# Patient Record
Sex: Male | Born: 1935
Health system: Southern US, Community
[De-identification: ages and names within clinical notes are randomized; demographics above are authoritative.]

## PROBLEM LIST (undated history)

## (undated) DIAGNOSIS — K219 Gastro-esophageal reflux disease without esophagitis: Secondary | ICD-10-CM

## (undated) DIAGNOSIS — Z8673 Personal history of transient ischemic attack (TIA), and cerebral infarction without residual deficits: Secondary | ICD-10-CM

## (undated) DIAGNOSIS — M1990 Unspecified osteoarthritis, unspecified site: Secondary | ICD-10-CM

## (undated) DIAGNOSIS — J189 Pneumonia, unspecified organism: Secondary | ICD-10-CM

## (undated) DIAGNOSIS — Z9289 Personal history of other medical treatment: Secondary | ICD-10-CM

## (undated) DIAGNOSIS — N189 Chronic kidney disease, unspecified: Secondary | ICD-10-CM

## (undated) DIAGNOSIS — N2889 Other specified disorders of kidney and ureter: Secondary | ICD-10-CM

## (undated) DIAGNOSIS — H409 Unspecified glaucoma: Secondary | ICD-10-CM

## (undated) DIAGNOSIS — E039 Hypothyroidism, unspecified: Secondary | ICD-10-CM

## (undated) DIAGNOSIS — R4182 Altered mental status, unspecified: Secondary | ICD-10-CM

## (undated) DIAGNOSIS — F329 Major depressive disorder, single episode, unspecified: Secondary | ICD-10-CM

## (undated) DIAGNOSIS — D62 Acute posthemorrhagic anemia: Secondary | ICD-10-CM

## (undated) DIAGNOSIS — G4700 Insomnia, unspecified: Secondary | ICD-10-CM

## (undated) DIAGNOSIS — H269 Unspecified cataract: Secondary | ICD-10-CM

## (undated) DIAGNOSIS — F419 Anxiety disorder, unspecified: Secondary | ICD-10-CM

## (undated) DIAGNOSIS — H3530 Unspecified macular degeneration: Secondary | ICD-10-CM

## (undated) DIAGNOSIS — C801 Malignant (primary) neoplasm, unspecified: Secondary | ICD-10-CM

## (undated) DIAGNOSIS — F32A Depression, unspecified: Secondary | ICD-10-CM

## (undated) DIAGNOSIS — I639 Cerebral infarction, unspecified: Secondary | ICD-10-CM

## (undated) DIAGNOSIS — E785 Hyperlipidemia, unspecified: Secondary | ICD-10-CM

## (undated) DIAGNOSIS — E538 Deficiency of other specified B group vitamins: Secondary | ICD-10-CM

## (undated) DIAGNOSIS — I10 Essential (primary) hypertension: Secondary | ICD-10-CM

## (undated) DIAGNOSIS — E876 Hypokalemia: Secondary | ICD-10-CM

## (undated) DIAGNOSIS — Z87440 Personal history of urinary (tract) infections: Secondary | ICD-10-CM

## (undated) DIAGNOSIS — G629 Polyneuropathy, unspecified: Secondary | ICD-10-CM

## (undated) DIAGNOSIS — Z5189 Encounter for other specified aftercare: Secondary | ICD-10-CM

## (undated) DIAGNOSIS — I493 Ventricular premature depolarization: Secondary | ICD-10-CM

## (undated) DIAGNOSIS — R011 Cardiac murmur, unspecified: Secondary | ICD-10-CM

## (undated) HISTORY — DX: Unspecified macular degeneration: H35.30

## (undated) HISTORY — DX: Unspecified osteoarthritis, unspecified site: M19.90

## (undated) HISTORY — DX: Personal history of other medical treatment: Z92.89

## (undated) HISTORY — DX: Hypothyroidism, unspecified: E03.9

## (undated) HISTORY — DX: Unspecified cataract: H26.9

## (undated) HISTORY — DX: Encounter for other specified aftercare: Z51.89

## (undated) HISTORY — PX: APPENDECTOMY: SHX54

## (undated) HISTORY — DX: Ventricular premature depolarization: I49.3

## (undated) HISTORY — DX: Hypokalemia: E87.6

## (undated) HISTORY — DX: Depression, unspecified: F32.A

## (undated) HISTORY — DX: Unspecified glaucoma: H40.9

## (undated) HISTORY — DX: Altered mental status, unspecified: R41.82

## (undated) HISTORY — DX: Anxiety disorder, unspecified: F41.9

## (undated) HISTORY — PX: PROSTATE ABLATION: SHX6042

## (undated) HISTORY — PX: TONSILLECTOMY AND ADENOIDECTOMY: SUR1326

## (undated) HISTORY — DX: Personal history of urinary (tract) infections: Z87.440

## (undated) HISTORY — PX: OTHER SURGICAL HISTORY: SHX169

## (undated) HISTORY — PX: THYROIDECTOMY: SHX17

## (undated) HISTORY — PX: CATARACT EXTRACTION W/ INTRAOCULAR LENS  IMPLANT, BILATERAL: SHX1307

## (undated) HISTORY — DX: Hyperlipidemia, unspecified: E78.5

## (undated) HISTORY — DX: Personal history of transient ischemic attack (TIA), and cerebral infarction without residual deficits: Z86.73

## (undated) HISTORY — DX: Deficiency of other specified B group vitamins: E53.8

## (undated) HISTORY — PX: REPLACEMENT TOTAL KNEE: SUR1224

## (undated) HISTORY — DX: Insomnia, unspecified: G47.00

## (undated) HISTORY — PX: INGUINAL HERNIA REPAIR: SUR1180

## (undated) HISTORY — DX: Major depressive disorder, single episode, unspecified: F32.9

## (undated) HISTORY — DX: Essential (primary) hypertension: I10

---

## 1997-11-13 ENCOUNTER — Other Ambulatory Visit: Admission: RE | Admit: 1997-11-13 | Discharge: 1997-11-13 | Payer: Self-pay | Admitting: Cardiology

## 1998-01-13 ENCOUNTER — Emergency Department (HOSPITAL_COMMUNITY): Admission: EM | Admit: 1998-01-13 | Discharge: 1998-01-13 | Payer: Self-pay

## 1999-06-26 ENCOUNTER — Emergency Department (HOSPITAL_COMMUNITY): Admission: EM | Admit: 1999-06-26 | Discharge: 1999-06-26 | Payer: Self-pay | Admitting: Emergency Medicine

## 2002-09-20 HISTORY — PX: US ECHOCARDIOGRAPHY: HXRAD669

## 2006-11-10 HISTORY — PX: US ECHOCARDIOGRAPHY: HXRAD669

## 2009-10-21 ENCOUNTER — Encounter: Admission: RE | Admit: 2009-10-21 | Discharge: 2009-10-21 | Payer: Self-pay | Admitting: Cardiology

## 2009-10-25 ENCOUNTER — Encounter: Admission: RE | Admit: 2009-10-25 | Discharge: 2009-10-25 | Payer: Self-pay | Admitting: Cardiology

## 2010-01-03 ENCOUNTER — Observation Stay (HOSPITAL_COMMUNITY): Admission: EM | Admit: 2010-01-03 | Discharge: 2010-01-03 | Payer: Self-pay | Admitting: Emergency Medicine

## 2010-01-13 ENCOUNTER — Inpatient Hospital Stay (HOSPITAL_COMMUNITY): Admission: RE | Admit: 2010-01-13 | Discharge: 2010-01-17 | Payer: Self-pay | Admitting: Orthopedic Surgery

## 2010-02-12 ENCOUNTER — Ambulatory Visit: Payer: Self-pay | Admitting: Cardiology

## 2010-04-25 ENCOUNTER — Ambulatory Visit: Payer: Self-pay | Admitting: Cardiology

## 2010-05-06 ENCOUNTER — Ambulatory Visit: Payer: Self-pay | Admitting: Cardiology

## 2010-05-15 ENCOUNTER — Ambulatory Visit: Payer: Self-pay | Admitting: Cardiology

## 2010-08-12 ENCOUNTER — Other Ambulatory Visit (INDEPENDENT_AMBULATORY_CARE_PROVIDER_SITE_OTHER): Payer: Medicare Other

## 2010-08-12 DIAGNOSIS — I10 Essential (primary) hypertension: Secondary | ICD-10-CM

## 2010-08-12 DIAGNOSIS — Z79899 Other long term (current) drug therapy: Secondary | ICD-10-CM

## 2010-08-12 DIAGNOSIS — E119 Type 2 diabetes mellitus without complications: Secondary | ICD-10-CM

## 2010-08-12 DIAGNOSIS — E032 Hypothyroidism due to medicaments and other exogenous substances: Secondary | ICD-10-CM

## 2010-08-15 ENCOUNTER — Ambulatory Visit (INDEPENDENT_AMBULATORY_CARE_PROVIDER_SITE_OTHER): Payer: Medicare Other | Admitting: Cardiology

## 2010-08-15 DIAGNOSIS — Z79899 Other long term (current) drug therapy: Secondary | ICD-10-CM

## 2010-08-15 DIAGNOSIS — E7800 Pure hypercholesterolemia, unspecified: Secondary | ICD-10-CM

## 2010-08-15 DIAGNOSIS — I119 Hypertensive heart disease without heart failure: Secondary | ICD-10-CM

## 2010-08-15 DIAGNOSIS — E119 Type 2 diabetes mellitus without complications: Secondary | ICD-10-CM

## 2010-08-29 LAB — BASIC METABOLIC PANEL
BUN: 9 mg/dL (ref 6–23)
CO2: 28 mEq/L (ref 19–32)
Calcium: 8.7 mg/dL (ref 8.4–10.5)
Chloride: 102 mEq/L (ref 96–112)
Creatinine, Ser: 0.89 mg/dL (ref 0.4–1.5)
GFR calc Af Amer: >60 mL/min (ref >60)
Potassium: 4.3 mEq/L (ref 3.5–5.1)
Potassium: 4.4 mEq/L (ref 3.5–5.1)
Sodium: 138 mEq/L (ref 135–145)

## 2010-08-29 LAB — GLUCOSE, CAPILLARY
Glucose-Capillary: 119 mg/dL — ABNORMAL HIGH (ref 70–99)
Glucose-Capillary: 128 mg/dL — ABNORMAL HIGH (ref 70–99)
Glucose-Capillary: 137 mg/dL — ABNORMAL HIGH (ref 70–99)
Glucose-Capillary: 140 mg/dL — ABNORMAL HIGH (ref 70–99)
Glucose-Capillary: 145 mg/dL — ABNORMAL HIGH (ref 70–99)
Glucose-Capillary: 151 mg/dL — ABNORMAL HIGH (ref 70–99)
Glucose-Capillary: 152 mg/dL — ABNORMAL HIGH (ref 70–99)
Glucose-Capillary: 156 mg/dL — ABNORMAL HIGH (ref 70–99)
Glucose-Capillary: 191 mg/dL — ABNORMAL HIGH (ref 70–99)
Glucose-Capillary: 213 mg/dL — ABNORMAL HIGH (ref 70–99)

## 2010-08-29 LAB — ABO/RH: ABO/RH(D): O POS

## 2010-08-29 LAB — CBC
HCT: 27.8 % — ABNORMAL LOW (ref 39.0–52.0)
HCT: 33.4 % — ABNORMAL LOW (ref 39.0–52.0)
Hemoglobin: 11.6 g/dL — ABNORMAL LOW (ref 13.0–17.0)
Hemoglobin: 9.9 g/dL — ABNORMAL LOW (ref 13.0–17.0)
MCH: 32.5 pg (ref 26.0–34.0)
MCV: 93.9 fL (ref 78.0–100.0)
MCV: 94.5 fL (ref 78.0–100.0)
Platelets: 208 10*3/uL (ref 150–400)
Platelets: 232 10*3/uL (ref 150–400)
RDW: 13.1 % (ref 11.5–15.5)
WBC: 12.1 10*3/uL — ABNORMAL HIGH (ref 4.0–10.5)

## 2010-08-29 LAB — PROTIME-INR
INR: 1.06 (ref 0.00–1.49)
INR: 1.77 — ABNORMAL HIGH (ref 0.00–1.49)
INR: 2.13 — ABNORMAL HIGH (ref 0.00–1.49)

## 2010-08-29 LAB — TYPE AND SCREEN
ABO/RH(D): O POS
Antibody Screen: NEGATIVE

## 2010-08-30 LAB — POCT I-STAT, CHEM 8
BUN: 26 mg/dL — ABNORMAL HIGH (ref 6–23)
Calcium, Ion: 1 mmol/L — ABNORMAL LOW (ref 1.12–1.32)
Chloride: 103 mEq/L (ref 96–112)
Creatinine, Ser: 1 mg/dL (ref 0.4–1.5)
Glucose, Bld: 242 mg/dL — ABNORMAL HIGH (ref 70–99)
HCT: 51 % (ref 39.0–52.0)
Hemoglobin: 17.3 g/dL — ABNORMAL HIGH (ref 13.0–17.0)
Potassium: 4.1 mEq/L (ref 3.5–5.1)
Sodium: 139 mEq/L (ref 135–145)
TCO2: 27 mmol/L (ref 0–100)

## 2010-08-30 LAB — APTT: aPTT: 28 seconds (ref 24–37)

## 2010-08-30 LAB — URINALYSIS, ROUTINE W REFLEX MICROSCOPIC
Glucose, UA: 250 mg/dL — AB
Hgb urine dipstick: NEGATIVE
Nitrite: NEGATIVE
Specific Gravity, Urine: 1.008 (ref 1.005–1.030)
Specific Gravity, Urine: 1.023 (ref 1.005–1.030)
Urobilinogen, UA: 0.2 mg/dL (ref 0.0–1.0)

## 2010-08-30 LAB — DIFFERENTIAL
Lymphocytes Relative: 2 % — ABNORMAL LOW (ref 12–46)
Lymphs Abs: 0.2 10*3/uL — ABNORMAL LOW (ref 0.7–4.0)
Neutrophils Relative %: 95 % — ABNORMAL HIGH (ref 43–77)

## 2010-08-30 LAB — CBC
MCH: 32.6 pg (ref 26.0–34.0)
MCV: 94.9 fL (ref 78.0–100.0)
Platelets: 190 10*3/uL (ref 150–400)
Platelets: 181 10*3/uL (ref 150–400)
RBC: 4.5 MIL/uL (ref 4.22–5.81)
RBC: 4.98 MIL/uL (ref 4.22–5.81)
WBC: 5.8 10*3/uL (ref 4.0–10.5)
WBC: 13.7 10*3/uL — ABNORMAL HIGH (ref 4.0–10.5)

## 2010-08-30 LAB — COMPREHENSIVE METABOLIC PANEL
ALT: 30 U/L (ref 0–53)
AST: 35 U/L (ref 0–37)
Albumin: 4.2 g/dL (ref 3.5–5.2)
Chloride: 105 mEq/L (ref 96–112)
Creatinine, Ser: 1.03 mg/dL (ref 0.4–1.5)
GFR calc Af Amer: >60 mL/min (ref >60)
Potassium: 4.8 mEq/L (ref 3.5–5.1)
Sodium: 142 mEq/L (ref 135–145)
Total Bilirubin: 0.7 mg/dL (ref 0.3–1.2)

## 2010-08-30 LAB — POCT CARDIAC MARKERS
CKMB, poc: 1.1 ng/mL (ref 1.0–8.0)
Troponin i, poc: <0.05 ng/mL (ref 0.00–0.09)
Troponin i, poc: <0.05 ng/mL (ref 0.00–0.09)

## 2010-08-30 LAB — DIGOXIN LEVEL: Digoxin Level: 0.8 ng/mL (ref 0.8–2.0)

## 2010-09-08 ENCOUNTER — Other Ambulatory Visit: Payer: Self-pay | Admitting: *Deleted

## 2010-09-08 DIAGNOSIS — G4700 Insomnia, unspecified: Secondary | ICD-10-CM

## 2010-09-08 MED ORDER — ZOLPIDEM TARTRATE 5 MG PO TABS: 5.0000 mg | ORAL_TABLET | Freq: Every evening | ORAL | Status: DC | PRN

## 2010-09-08 NOTE — Telephone Encounter (Signed)
Patient phone requesting 90 day rx for [place]

## 2010-12-19 ENCOUNTER — Encounter: Payer: Self-pay | Admitting: Cardiology

## 2010-12-19 ENCOUNTER — Other Ambulatory Visit: Payer: Self-pay | Admitting: *Deleted

## 2010-12-19 DIAGNOSIS — E785 Hyperlipidemia, unspecified: Secondary | ICD-10-CM

## 2010-12-19 DIAGNOSIS — Z79899 Other long term (current) drug therapy: Secondary | ICD-10-CM

## 2010-12-19 DIAGNOSIS — E119 Type 2 diabetes mellitus without complications: Secondary | ICD-10-CM

## 2010-12-23 ENCOUNTER — Other Ambulatory Visit (INDEPENDENT_AMBULATORY_CARE_PROVIDER_SITE_OTHER): Payer: Medicare Other | Admitting: *Deleted

## 2010-12-23 DIAGNOSIS — Z79899 Other long term (current) drug therapy: Secondary | ICD-10-CM

## 2010-12-23 DIAGNOSIS — E119 Type 2 diabetes mellitus without complications: Secondary | ICD-10-CM

## 2010-12-23 DIAGNOSIS — E785 Hyperlipidemia, unspecified: Secondary | ICD-10-CM

## 2010-12-23 LAB — T4, FREE: Free T4: 0.76 ng/dL (ref 0.60–1.60)

## 2010-12-23 LAB — LIPID PANEL
Cholesterol: 167 mg/dL (ref 0–200)
Triglycerides: 44 mg/dL (ref 0.0–149.0)
VLDL: 8.8 mg/dL (ref 0.0–40.0)

## 2010-12-23 LAB — TSH: TSH: 0.03 u[IU]/mL — ABNORMAL LOW (ref 0.35–5.50)

## 2010-12-23 LAB — HEPATIC FUNCTION PANEL
AST: 25 U/L (ref 0–37)
Albumin: 5.1 g/dL (ref 3.5–5.2)

## 2010-12-23 LAB — BASIC METABOLIC PANEL
BUN: 18 mg/dL (ref 6–23)
Creatinine, Ser: 1 mg/dL (ref 0.4–1.5)
GFR: 77.23 mL/min (ref >60.00)
Glucose, Bld: 126 mg/dL — ABNORMAL HIGH (ref 70–99)
Potassium: 4.6 mEq/L (ref 3.5–5.1)

## 2010-12-24 ENCOUNTER — Encounter: Payer: Self-pay | Admitting: Cardiology

## 2010-12-25 ENCOUNTER — Ambulatory Visit (INDEPENDENT_AMBULATORY_CARE_PROVIDER_SITE_OTHER): Payer: Medicare Other | Admitting: Cardiology

## 2010-12-25 ENCOUNTER — Encounter: Payer: Self-pay | Admitting: Cardiology

## 2010-12-25 DIAGNOSIS — I119 Hypertensive heart disease without heart failure: Secondary | ICD-10-CM | POA: Insufficient documentation

## 2010-12-25 DIAGNOSIS — F419 Anxiety disorder, unspecified: Secondary | ICD-10-CM | POA: Insufficient documentation

## 2010-12-25 DIAGNOSIS — N529 Male erectile dysfunction, unspecified: Secondary | ICD-10-CM

## 2010-12-25 DIAGNOSIS — E119 Type 2 diabetes mellitus without complications: Secondary | ICD-10-CM

## 2010-12-25 DIAGNOSIS — E039 Hypothyroidism, unspecified: Secondary | ICD-10-CM | POA: Insufficient documentation

## 2010-12-25 DIAGNOSIS — H3530 Unspecified macular degeneration: Secondary | ICD-10-CM | POA: Insufficient documentation

## 2010-12-25 DIAGNOSIS — Z79899 Other long term (current) drug therapy: Secondary | ICD-10-CM

## 2010-12-25 DIAGNOSIS — R5383 Other fatigue: Secondary | ICD-10-CM

## 2010-12-25 DIAGNOSIS — M1990 Unspecified osteoarthritis, unspecified site: Secondary | ICD-10-CM

## 2010-12-25 DIAGNOSIS — R5381 Other malaise: Secondary | ICD-10-CM

## 2010-12-25 DIAGNOSIS — F341 Dysthymic disorder: Secondary | ICD-10-CM

## 2010-12-25 DIAGNOSIS — E785 Hyperlipidemia, unspecified: Secondary | ICD-10-CM

## 2010-12-25 DIAGNOSIS — G4700 Insomnia, unspecified: Secondary | ICD-10-CM

## 2010-12-25 MED ORDER — ZOLPIDEM TARTRATE 5 MG PO TABS: 5.0000 mg | ORAL_TABLET | Freq: Every evening | ORAL | Status: DC | PRN

## 2010-12-25 MED ORDER — CLOPIDOGREL BISULFATE 75 MG PO TABS: 75.0000 mg | ORAL_TABLET | Freq: Every day | ORAL | Status: DC

## 2010-12-25 MED ORDER — SILDENAFIL CITRATE 100 MG PO TABS: ORAL_TABLET | ORAL | Status: DC

## 2010-12-25 MED ORDER — LIOTHYRONINE SODIUM 25 MCG PO TABS: 25.0000 ug | ORAL_TABLET | Freq: Every day | ORAL | Status: DC

## 2010-12-25 NOTE — Assessment & Plan Note (Signed)
The patient comes in for a scheduled followup visit.  He has been feeling very fatigued.  He has not been having much energy.  He has a lot of leg cramps at night.  He's not able to exercise because of osteoarthritis of his right knee.  He's not been aware of any hematochezia or melena or occult blood loss to explain his fatigue.  He does have a history of hypothyroidism but his thyroid levels have been normal when we checked them.  He does have a past history of anxiety and depression which may be a large part of his complaints of fatigue

## 2010-12-25 NOTE — Progress Notes (Signed)
[name] Date of Birth:  1928-07-26 [facility] / [facility] [address]   [address] [place], [place]  [address] [phone]           Fax   [phone]  History of Present Illness: This pleasant 77 year old gentleman is seen back for a four-month followup office visit.  Has a complex past medical history.  He's had a history of essential hypertension and diabetes.  He's had a history of fatigue and a history of anxiety and depression.  He does not have any history of known ischemic heart disease.  His last nuclear stress test was 10/23/09 and was a walking LexiScan because of his bad knee and he had a normal ejection fraction of 68% and no ischemia.  EKG shows of false positive resp his last echocardiogram was 11/10/06 and showed mild LVH with normal systolic function and with impaired relaxation, mild aortic sclerosis with trivial aortic insufficiency, and mild mitral regurgitation and mild tricuspid regurgitation.  The patient has a long history of genitourinary problems and is followed closely by his urologist Dr. Patsi Sears who is evaluating his difficulty in voiding.  The patient also has a history of erectile dysfunction and is on Viagra p.r.n.  Current Outpatient Prescriptions  Medication Sig Dispense Refill  . ALPRAZolam (XANAX) 0.25 MG tablet Take 0.25 mg by mouth at bedtime as needed.        Marland Kitchen amLODipine (NORVASC) 5 MG tablet Take 5 mg by mouth daily.        Marland Kitchen aspirin 81 MG EC tablet Take 81 mg by mouth daily.        . Bevacizumab (AVASTIN IV) Inject into the vein as directed.        . clopidogrel (PLAVIX) 75 MG tablet Take 1 tablet (75 mg total) by mouth daily.  90 tablet  3  . digoxin (LANOXIN) 0.25 MG tablet Take 250 mcg by mouth daily.        . famotidine (PEPCID) 40 MG tablet Take 40 mg by mouth daily.        Marland Kitchen latanoprost (XALATAN) 0.005 % ophthalmic solution 1 drop at bedtime.        Marland Kitchen levothyroxine (SYNTHROID, LEVOTHROID) 100 MCG tablet Take 100 mcg by mouth  daily.       Marland Kitchen liothyronine (CYTOMEL) 25 MCG tablet Take 25 mcg by mouth daily.        . metFORMIN (GLUCOPHAGE) 1000 MG tablet Take 1,500 mg by mouth 2 (two) times daily with a meal.       . metoprolol succinate (TOPROL-XL) 25 MG 24 hr tablet Take 25 mg by mouth daily.        . sildenafil (VIAGRA) 100 MG tablet Take 100 mg by mouth as directed.        . zolpidem (AMBIEN) 5 MG tablet Take 1 tablet (5 mg total) by mouth at bedtime as needed for sleep.  90 tablet  1    Allergies  Allergen Reactions  . Lipitor (Atorvastatin Calcium)   . Zetia (Ezetimibe)     Patient Active Problem List  Diagnoses  . Hypothyroidism  . Benign hypertensive heart disease without heart failure  . Fatigue  . Dyslipidemia  . Diabetes mellitus  . Anxiety and depression  . Macular degeneration of both eyes  . Osteoarthritis    History  Smoking status  . Never Smoker   Smokeless tobacco  . Not on file    History  Alcohol Use No    Family  History  Problem Relation Age of Onset  . Hypertension Mother   . Arthritis Mother   . Diabetes Mother   . Hypertension Father   . Stroke Father   . Kidney disease Father   . Hypertension Brother   . Diabetes Brother     Review of Systems: Constitutional: no fever chills diaphoresis or fatigue or change in weight.  Head and neck: no hearing loss, no epistaxis, no photophobia or visual disturbance. Respiratory: No cough, shortness of breath or wheezing. Cardiovascular: No chest pain peripheral edema, palpitations. Gastrointestinal: No abdominal distention, no abdominal pain, no change in bowel habits hematochezia or melena. Genitourinary: No dysuria, no frequency, no urgency, no nocturia. Musculoskeletal:No arthralgias, no back pain, no gait disturbance or myalgias. Neurological: No dizziness, no headaches, no numbness, no seizures, no syncope, no weakness, no tremors. Hematologic: No lymphadenopathy, no easy bruising. Psychiatric: No confusion, no  hallucinations, no sleep disturbance.    Physical Exam: Filed Vitals:   12/25/10 0830  BP: 130/74  Pulse: 60  The general appearance reveals a well-developed well-nourished gentleman who is very anxious.Pupils equal and reactive.   Extraocular Movements are full.  There is no scleral icterus.  The mouth and pharynx are normal.  The neck is supple.  The carotids reveal no bruits.  The jugular venous pressure is normal.  The thyroid is not enlarged.  There is no lymphadenopathy.  His vision is decreased secondary to macular degeneration.The chest is clear to percussion and auscultation. There are no rales or rhonchi. Expansion of the chest is symmetrical.The precordium is quiet.  The first heart sound is normal.  The second heart sound is physiologically split.  There is no murmur gallop rub or click.  There is no abnormal lift or heaveThe abdomen is soft and nontender. Bowel sounds are normal. The liver and spleen are not enlarged. There Are no abdominal masses. There are no bruits.The pedal pulses are good.  There is no phlebitis or edema.  There is no cyanosis or clubbing.Strength is normal and symmetrical in all extremities.  There is no lateralizing weakness.  There are no sensory deficits.The skin is warm and dry.  There is no rash.   Assessment / Plan: The patient is to continue his same medication.  Blood work is pending.  Recheck in 4 months for followup office visit and fasting lab work.

## 2010-12-25 NOTE — Assessment & Plan Note (Signed)
Patient has a history of diabetes mellitus.  He has not been having any hypoglycemic episodes.  He watches his diet and his weight is down 2 pounds since last visit.

## 2011-01-26 ENCOUNTER — Encounter: Payer: Self-pay | Admitting: Cardiology

## 2011-04-23 ENCOUNTER — Other Ambulatory Visit (INDEPENDENT_AMBULATORY_CARE_PROVIDER_SITE_OTHER): Payer: Medicare Other | Admitting: *Deleted

## 2011-04-23 DIAGNOSIS — E785 Hyperlipidemia, unspecified: Secondary | ICD-10-CM

## 2011-04-23 DIAGNOSIS — I119 Hypertensive heart disease without heart failure: Secondary | ICD-10-CM

## 2011-04-23 DIAGNOSIS — Z79899 Other long term (current) drug therapy: Secondary | ICD-10-CM

## 2011-04-23 LAB — HEPATIC FUNCTION PANEL
AST: 21 U/L (ref 0–37)
Alkaline Phosphatase: 72 U/L (ref 39–117)
Bilirubin, Direct: 0.1 mg/dL (ref 0.0–0.3)
Total Bilirubin: 1 mg/dL (ref 0.3–1.2)

## 2011-04-23 LAB — LIPID PANEL
Total CHOL/HDL Ratio: 3
Triglycerides: 40 mg/dL (ref 0.0–149.0)

## 2011-04-23 LAB — BASIC METABOLIC PANEL
BUN: 20 mg/dL (ref 6–23)
CO2: 30 mEq/L (ref 19–32)
Calcium: 9.1 mg/dL (ref 8.4–10.5)
Creatinine, Ser: 1 mg/dL (ref 0.4–1.5)
Glucose, Bld: 128 mg/dL — ABNORMAL HIGH (ref 70–99)

## 2011-04-27 ENCOUNTER — Encounter: Payer: Self-pay | Admitting: Cardiology

## 2011-04-27 ENCOUNTER — Ambulatory Visit (INDEPENDENT_AMBULATORY_CARE_PROVIDER_SITE_OTHER): Payer: Medicare Other | Admitting: Cardiology

## 2011-04-27 VITALS — BP 136/72 | HR 58 | Ht 73.0 in | Wt 160.0 lb

## 2011-04-27 DIAGNOSIS — I119 Hypertensive heart disease without heart failure: Secondary | ICD-10-CM

## 2011-04-27 DIAGNOSIS — G4700 Insomnia, unspecified: Secondary | ICD-10-CM

## 2011-04-27 DIAGNOSIS — N401 Enlarged prostate with lower urinary tract symptoms: Secondary | ICD-10-CM

## 2011-04-27 DIAGNOSIS — F419 Anxiety disorder, unspecified: Secondary | ICD-10-CM

## 2011-04-27 DIAGNOSIS — E039 Hypothyroidism, unspecified: Secondary | ICD-10-CM

## 2011-04-27 DIAGNOSIS — E119 Type 2 diabetes mellitus without complications: Secondary | ICD-10-CM

## 2011-04-27 DIAGNOSIS — M1990 Unspecified osteoarthritis, unspecified site: Secondary | ICD-10-CM

## 2011-04-27 DIAGNOSIS — H3530 Unspecified macular degeneration: Secondary | ICD-10-CM

## 2011-04-27 DIAGNOSIS — N139 Obstructive and reflux uropathy, unspecified: Secondary | ICD-10-CM

## 2011-04-27 DIAGNOSIS — N138 Other obstructive and reflux uropathy: Secondary | ICD-10-CM

## 2011-04-27 MED ORDER — AMLODIPINE BESYLATE 5 MG PO TABS: 5.0000 mg | ORAL_TABLET | Freq: Every day | ORAL | Status: DC

## 2011-04-27 MED ORDER — ZOLPIDEM TARTRATE 5 MG PO TABS: 5.0000 mg | ORAL_TABLET | Freq: Every evening | ORAL | Status: DC | PRN

## 2011-04-27 MED ORDER — METOPROLOL SUCCINATE ER 25 MG PO TB24: 25.0000 mg | ORAL_TABLET | Freq: Every day | ORAL | Status: DC

## 2011-04-27 MED ORDER — ALPRAZOLAM 0.25 MG PO TABS: 0.2500 mg | ORAL_TABLET | Freq: Every day | ORAL | Status: DC | PRN

## 2011-04-27 NOTE — Assessment & Plan Note (Signed)
His vision is deteriorating further in his good eye which is his left eye

## 2011-04-27 NOTE — Patient Instructions (Signed)
Your physician recommends that you continue on your current medications as directed. Please refer to the Current Medication list given to you today.  Your physician recommends that you schedule a follow-up appointment in: 4 months with fasting labs (lp/bmet/hfp/tsh/t4/a1c)

## 2011-04-27 NOTE — Assessment & Plan Note (Signed)
The patient is clinically euthyroid. 

## 2011-04-27 NOTE — Assessment & Plan Note (Signed)
The patient is not having any hypoglycemic episodes 

## 2011-04-27 NOTE — Assessment & Plan Note (Signed)
The patient remains extremely anxious.  He is worried about his failing vision and also about the fact that his voiding has not improved since surgery

## 2011-04-27 NOTE — Progress Notes (Signed)
Stephen Wilkinson Date of Birth:  [birth year] [facility] / [facility] [address]   [address] [name], [place]  [address] [phone]           Fax   [phone]  History of Present Illness: This pleasant 77 year old gentleman is seen for a scheduled followup office visit he has a complex past medical history.  He had a history of essential hypertension and diabetes.  He said history of fatigue and a history of anxiety and depression.  Does not have any history of known ischemic heart disease.  His last nuclear stress test was 10/23/09 and he had a normal ejection fraction of 60% and no ischemia.  His last echocardiogram was 11/10/06 and showed mild LVH with normal systolic function with impaired relaxation.  Patient has a long history of genitourinary problems and is followed by his urologist Dr. Cassell Smiles who performed  surgery on his prostate about 6 weeks ago.  Patient also has a problem with severe osteoarthritis of the right knee and is scheduled for right knee replacement by Dr. Lequita Halt in 2 weeks.  Current Outpatient Prescriptions  Medication Sig Dispense Refill  . ALPRAZolam (XANAX) 0.25 MG tablet Take 0.25 mg by mouth at bedtime as needed.        Marland Kitchen amLODipine (NORVASC) 5 MG tablet Take 5 mg by mouth daily.        Marland Kitchen aspirin 81 MG EC tablet Take 81 mg by mouth daily.        . Bevacizumab (AVASTIN IV) Inject into the vein as directed.        . clopidogrel (PLAVIX) 75 MG tablet Take 1 tablet (75 mg total) by mouth daily.  90 tablet  3  . digoxin (LANOXIN) 0.25 MG tablet Take 250 mcg by mouth as directed. 1 tablet 5 days a week and 1 & 1/2 2 days a week      . famotidine (PEPCID) 40 MG tablet Take 40 mg by mouth daily.        Marland Kitchen latanoprost (XALATAN) 0.005 % ophthalmic solution 1 drop at bedtime.        Marland Kitchen levothyroxine (SYNTHROID, LEVOTHROID) 100 MCG tablet Take 100 mcg by mouth as directed. 1 tablet 4 days a week and 1 & 1/2 tablet 3 days a week      . liothyronine (CYTOMEL) 25 MCG  tablet Take 25 mcg by mouth as directed. 1 tablet 6 days a week      . metFORMIN (GLUCOPHAGE) 1000 MG tablet Take 1,500 mg by mouth 2 (two) times daily with a meal.       . metoprolol succinate (TOPROL-XL) 25 MG 24 hr tablet Take 25 mg by mouth daily.        . sildenafil (VIAGRA) 100 MG tablet Take 100 mg by mouth as directed.        . zolpidem (AMBIEN) 5 MG tablet Take 1 tablet (5 mg total) by mouth at bedtime as needed for sleep.  90 tablet  1    Allergies  Allergen Reactions  . Lipitor (Atorvastatin Calcium)   . Zetia (Ezetimibe)     Patient Active Problem List  Diagnoses  . Hypothyroidism  . Benign hypertensive heart disease without heart failure  . Fatigue  . Dyslipidemia  . Diabetes mellitus  . Anxiety and depression  . Macular degeneration of both eyes  . Osteoarthritis    History  Smoking status  . Never Smoker   Smokeless tobacco  . Not on file  History  Alcohol Use No    Family History  Problem Relation Age of Onset  . Hypertension Mother   . Arthritis Mother   . Diabetes Mother   . Hypertension Father   . Stroke Father   . Kidney disease Father   . Hypertension Brother   . Diabetes Brother     Review of Systems: Constitutional: no fever chills diaphoresis or fatigue or change in weight.  Head and neck: no hearing loss, no epistaxis, no photophobia or visual disturbance. Respiratory: No cough, shortness of breath or wheezing. Cardiovascular: No chest pain peripheral edema, palpitations. Gastrointestinal: No abdominal distention, no abdominal pain, no change in bowel habits hematochezia or melena. Genitourinary: No dysuria, no frequency, no urgency, no nocturia. Musculoskeletal:No arthralgias, no back pain, no gait disturbance or myalgias. Neurological: No dizziness, no headaches, no numbness, no seizures, no syncope, no weakness, no tremors. Hematologic: No lymphadenopathy, no easy bruising. Psychiatric: No confusion, no hallucinations, no sleep  disturbance.    Physical Exam: Filed Vitals:   04/27/11 0918  BP: 136/72  Pulse: 58   The patient appears to be in no distress.  Head and neck exam reveals that the pupils are equal and reactive.  The extraocular movements are full.  There is no scleral icterus.  Mouth and pharynx are benign.  No lymphadenopathy.  No carotid bruits.  The jugular venous pressure is normal.  Thyroid is not enlarged or tender.  Chest is clear to percussion and auscultation.  No rales or rhonchi.  Expansion of the chest is symmetrical.  Heart reveals no abnormal lift or heave.  First and second heart sounds are normal.  There is no murmur gallop rub or click.  The abdomen is soft and nontender.  Bowel sounds are normoactive.  There is no hepatosplenomegaly or mass.  There are no abdominal bruits.  Extremities reveal no phlebitis or edema.  Pedal pulses are good.  There is no cyanosis or clubbing.  Neurologic exam is normal strength and no lateralizing weakness.  No sensory deficits.  Integument reveals no rash His EKG today shows sinus bradycardia and nonspecific ST-T wave changes but no acute changes and we gave him a copy of her to carry to his presurgical conference.  Assessment / Plan:  From a cardiac standpoint the patient is stable for upcoming orthopedic surgery.  Continue same medications and recheck here in 4 months

## 2011-05-01 ENCOUNTER — Encounter: Payer: Self-pay | Admitting: Cardiology

## 2011-05-01 ENCOUNTER — Other Ambulatory Visit: Payer: Self-pay | Admitting: Orthopedic Surgery

## 2011-05-01 ENCOUNTER — Encounter (HOSPITAL_COMMUNITY): Payer: Self-pay

## 2011-05-01 ENCOUNTER — Ambulatory Visit (HOSPITAL_COMMUNITY)
Admission: RE | Admit: 2011-05-01 | Discharge: 2011-05-01 | Disposition: A | Discharge status: Home / Self Care | Payer: Medicare Other | Source: Ambulatory Visit | Attending: Orthopedic Surgery | Admitting: Orthopedic Surgery

## 2011-05-01 ENCOUNTER — Encounter (HOSPITAL_COMMUNITY)
Admission: RE | Admit: 2011-05-01 | Discharge: 2011-05-01 | Payer: Medicare Other | Source: Ambulatory Visit | Attending: Orthopedic Surgery | Admitting: Orthopedic Surgery

## 2011-05-01 DIAGNOSIS — Z01812 Encounter for preprocedural laboratory examination: Secondary | ICD-10-CM | POA: Insufficient documentation

## 2011-05-01 DIAGNOSIS — M1710 Unilateral primary osteoarthritis, unspecified knee: Secondary | ICD-10-CM | POA: Insufficient documentation

## 2011-05-01 DIAGNOSIS — Z79899 Other long term (current) drug therapy: Secondary | ICD-10-CM | POA: Insufficient documentation

## 2011-05-01 DIAGNOSIS — Z7902 Long term (current) use of antithrombotics/antiplatelets: Secondary | ICD-10-CM | POA: Insufficient documentation

## 2011-05-01 DIAGNOSIS — E119 Type 2 diabetes mellitus without complications: Secondary | ICD-10-CM | POA: Insufficient documentation

## 2011-05-01 DIAGNOSIS — E7800 Pure hypercholesterolemia, unspecified: Secondary | ICD-10-CM | POA: Insufficient documentation

## 2011-05-01 HISTORY — DX: Cerebral infarction, unspecified: I63.9

## 2011-05-01 HISTORY — DX: Chronic kidney disease, unspecified: N18.9

## 2011-05-01 HISTORY — DX: Malignant (primary) neoplasm, unspecified: C80.1

## 2011-05-01 HISTORY — DX: Pneumonia, unspecified organism: J18.9

## 2011-05-01 LAB — COMPREHENSIVE METABOLIC PANEL
AST: 19 U/L (ref 0–37)
Albumin: 4.2 g/dL (ref 3.5–5.2)
BUN: 16 mg/dL (ref 6–23)
Calcium: 9.8 mg/dL (ref 8.4–10.5)
Creatinine, Ser: 0.94 mg/dL (ref 0.50–1.35)
GFR calc non Af Amer: 77 mL/min — ABNORMAL LOW (ref >90)

## 2011-05-01 LAB — URINALYSIS, ROUTINE W REFLEX MICROSCOPIC
Bilirubin Urine: NEGATIVE
Ketones, ur: NEGATIVE mg/dL
Leukocytes, UA: NEGATIVE
Nitrite: NEGATIVE
Specific Gravity, Urine: 1.022 (ref 1.005–1.030)
Urobilinogen, UA: 0.2 mg/dL (ref 0.0–1.0)
pH: 5.5 (ref 5.0–8.0)

## 2011-05-01 LAB — PROTIME-INR
INR: 0.99 (ref 0.00–1.49)
Prothrombin Time: 13.3 seconds (ref 11.6–15.2)

## 2011-05-01 LAB — CBC
HCT: 41.1 % (ref 39.0–52.0)
MCH: 31.6 pg (ref 26.0–34.0)
MCV: 91.5 fL (ref 78.0–100.0)
Platelets: 230 10*3/uL (ref 150–400)
RDW: 12.8 % (ref 11.5–15.5)

## 2011-05-01 NOTE — Pre-Procedure Instructions (Signed)
04/27/11 Pt seen by Dr Patty Sermons.  Office visit in [facility].

## 2011-05-01 NOTE — H&P (Signed)
Stephen Wilkinson  DOB: August 27, 1928  Date of Admission: 05/11/2011  Chief Complaint:  Right Knee Pain  History of Present Illness The patient is a 77 year old male who comes in today for a preoperative History and Physical. The patient is scheduled for a right total knee arthroplasty to be performed by Dr. Gus Rankin. Aluisio, MD at [facility] on 05/11/2011.They have been seen for right knee pain and known arthritis. He has done well with his first knee replacement and now presents for the other side.  Allergies Lipitor Zetia  Medication History ALPRAZolam (0.25MG  Tablet, Oral) Active. Aspirin EC (81MG  Tablet DR, Oral daily) Active. AmLODIPine Besylate (5MG  Tablet, Oral) Active. Avastin (400MG /16ML Solution, Intravenous once every 5 weeks) Active. Vit Balanced B-100 ( Oral daily) Active. Cytomel ( Tablet, Oral daily except Sunday) Active. Digoxin (0.25MG  Tablet, Oral) Active. Famotidine (40MG  Tablet, Oral) Active. Mega Vite 75 ( Oral daily) Active. MetFORMIN HCl (1000MG  Tablet, Oral) Active. Metoprolol Succinate (25MG  Tablet ER 24HR, Oral) Active. Plavix (75MG  Tablet, Oral daily) Active. Synthroid ( Tablet, Oral) Active. Xalatan (0.005% Solution, Ophthalmic at bedtime) Active. Zolpidem Tartrate (5MG  Tablet, Oral) Active.  Problem List/Past Medical Stroke Impaired Vision Anxiety Disorder History of Depression Macular Degeneration. Left Eye Legal Blindness, Partial Loss with Right Eye Glaucoma Cataract Chronic Bronchitis Hypertension Coronary Artery Disease/Heart Disease Hypercholesterolemia/Dyslipidemia Arrhythmia Enlarged prostate Urinary Tract Infection. Past History Hypothyroidism Non-Insulin Dependent Diabetes Mellitus Skin Cancer Measles Mumps  Past Surgical History Tonsillectomy Thyroidectomy; Subtotal. Partial Total Knee Replacement - Left Open Inguinal Hernia Surgery - Both Microwave Prostate Procedure  Family  History Father. Deceased, Cerebrovascular Accident, Hypertension. Age 25 Mother. Deceased. Diabetes, Hypertension, Arthritis, Age 37  Social History Merchant [employer] officer. Living Will, Healthcare POA Post-Surgical Plans. Plan to go home. Marital status. Married. Current work status. Retired. Tobacco use. Never smoker. Alcohol use. Occasional alcohol use. 1-2 drinks per day  Review of Systems General:Not Present- Chills, Fever, Night Sweats, Fatigue, Weight Gain, Weight Loss and Memory Loss. Skin:Not Present- Hives, Itching, Rash, Eczema and Lesions. HEENT:Not Present- Tinnitus, Headache, Double Vision, Visual Loss, Hearing Loss and Dentures. Respiratory:Not Present- Shortness of breath with exertion, Shortness of breath at rest, Allergies, Coughing up blood and Chronic Cough. Cardiovascular:Not Present- Chest Pain, Racing/skipping heartbeats, Difficulty Breathing Lying Down, Murmur, Swelling and Palpitations. Gastrointestinal:Not Present- Bloody Stool, Heartburn, Abdominal Pain, Vomiting, Nausea, Constipation, Diarrhea, Difficulty Swallowing, Jaundice and Loss of appetitie. Male Genitourinary:Present- Urinary frequency and Excessive Urination at Night. Not Present- Blood in Urine, Weak urinary stream, Discharge, Flank Pain, Incontinence, Painful Urination, Urgency, Urinary Retention and Urinating at Night. Musculoskeletal:Not Present- Muscle Weakness, Muscle Pain, Joint Swelling, Joint Pain, Back Pain, Morning Stiffness and Spasms. Neurological:Not Present- Tremor, Dizziness, Blackout spells, Paralysis, Difficulty with balance and Weakness. Psychiatric:Not Present- Insomnia.  Vitals Weight: 160 lb Height: 72 in Weight was reported by patient. Height was reported by patient. Body Surface Area: 1.92 m Body Mass Index: 21.7 kg/m Pulse: 60 (Regular) Resp.: 14 (Unlabored) BP: 152/72 (Sitting, Left Arm, Standard)  Physical Exam The physical exam findings are  as follows:  General Mental Status - Alert, cooperative and good historian. General Appearance- pleasant. Not in acute distress. Orientation- Oriented X3. Build & Nutrition- Well nourished and Well developed. Note: Good Historian, very pleasant.  Head and Neck Head- normocephalic, atraumatic . Neck Global Assessment- supple. no bruit auscultated on the right and no bruit auscultated on the left.  Eye Pupil- Bilateral- Regular and Round. Note: Legally blind in left eye but does have peripheral vision. Right eye  decreased vision but adequate and can read with right eye. Motion- Bilateral- EOMI.  Chest and Lung Exam Auscultation: Breath sounds:- clear at anterior chest wall and - clear at posterior chest wall. Adventitious sounds:- No Adventitious sounds.  Cardiovascular Auscultation:Rhythm- Regular rate and rhythm. Heart Sounds- S1 WNL and S2 WNL. Murmurs & Other Heart Sounds:Auscultation of the heart reveals - No Murmurs.  Abdomen Palpation/Percussion:Tenderness- Abdomen is non-tender to palpation. Rigidity (guarding)- Abdomen is soft. Auscultation:Auscultation of the abdomen reveals - Bowel sounds normal.  Male Genitourinary Note: Not done, not pertinent to present illness  Musculoskeletal Note: Knee shows no effusion. He has the varus deformity. He has crepitation throughout the ROM, tenderness to the joint line to palpation.  Assessment & Plan Osteoarthrosis Right Knee  Note: Plan is for a right total knee arthroplasty per Dr. Lequita Halt. Risks and benefits of the surgery have been discussed with the patient and they elect to proceed with surgery.  There are on active contraindications to upcoming procedure such as ongoing infection or progressive neurological disease. Please note that the patient's Urologist wanted him to be started on Cipro 500 mg twice a day right after his surgery during his hospital course.  He has been seen by his  cardiologist Dr. Patty Sermons preoperatively and felt to be stable for upcoming surgery.  Avel Peace, PA-C

## 2011-05-01 NOTE — Patient Instructions (Signed)
22 Stephen Wilkinson  05/01/2011   Your procedure is scheduled on:  05/11/11 0715 am-0820 am  Report to [facility] at 0515 AM.  Call this number if you have problems the morning of surgery: [phone]   Remember:Eat a good healthy snack prior to bedtime    Do not eat food:After Midnight.  Do not drink clear liquids: After Midnight.  Take these medicines the morning of surgery with A SIP OF WATER:    Do not wear jewelry,  Do not wear lotions, powders, or perfumes.     Do not bring valuables to the hospital.  Contacts, dentures or bridgework may not be worn into surgery.  Leave suitcase in the car. After surgery it may be brought to your room.  For patients admitted to the hospital, checkout time is 11:00 AM the day of discharge.      Special Instructions: CHG Shower Use Special Wash: 1/2 bottle night before surgery and 1/2 bottle morning of surgery. Shower chin to toes with CHG. Wash face and private parts with regular soap.   Please read over the following fact sheets that you were given: MRSA Information, Incentive Spirometry, coughing and deep breathing exercises, leg exercises, Blood Transfusion Fact Sheet

## 2011-05-08 MED ORDER — SODIUM CHLORIDE 0.9 % IJ SOLN: 10.0000 mL | INTRAMUSCULAR | Status: DC | PRN

## 2011-05-08 MED ORDER — SODIUM CHLORIDE 0.9 % IJ SOLN: 10.0000 mL | Freq: Two times a day (BID) | INTRAMUSCULAR | Status: DC

## 2011-05-10 MED ORDER — BUPIVACAINE 0.25 % ON-Q PUMP SINGLE CATH 300ML
300.0000 mL | INJECTION | Status: DC
  Administered 2011-05-11: 300 mL
  Filled 2011-05-10: qty 300

## 2011-05-11 ENCOUNTER — Inpatient Hospital Stay (HOSPITAL_COMMUNITY)
Admission: RE | Admit: 2011-05-11 | Discharge: 2011-05-15 | DRG: 470 | Disposition: A | Discharge status: Skilled Nursing Facility | Payer: Medicare Other | Source: Ambulatory Visit | Attending: Orthopedic Surgery | Admitting: Orthopedic Surgery

## 2011-05-11 ENCOUNTER — Encounter (HOSPITAL_COMMUNITY)
Admission: RE | Disposition: A | Discharge status: Skilled Nursing Facility | Payer: Self-pay | Source: Ambulatory Visit | Attending: Orthopedic Surgery

## 2011-05-11 ENCOUNTER — Encounter (HOSPITAL_COMMUNITY): Payer: Self-pay | Admitting: Anesthesiology

## 2011-05-11 ENCOUNTER — Encounter (HOSPITAL_COMMUNITY): Payer: Self-pay | Admitting: *Deleted

## 2011-05-11 ENCOUNTER — Inpatient Hospital Stay (HOSPITAL_COMMUNITY): Payer: Medicare Other | Admitting: Anesthesiology

## 2011-05-11 ENCOUNTER — Other Ambulatory Visit: Payer: Self-pay

## 2011-05-11 DIAGNOSIS — D62 Acute posthemorrhagic anemia: Secondary | ICD-10-CM | POA: Diagnosis not present

## 2011-05-11 DIAGNOSIS — E119 Type 2 diabetes mellitus without complications: Secondary | ICD-10-CM | POA: Diagnosis present

## 2011-05-11 DIAGNOSIS — M1710 Unilateral primary osteoarthritis, unspecified knee: Principal | ICD-10-CM | POA: Diagnosis present

## 2011-05-11 DIAGNOSIS — E876 Hypokalemia: Secondary | ICD-10-CM | POA: Diagnosis not present

## 2011-05-11 DIAGNOSIS — I951 Orthostatic hypotension: Secondary | ICD-10-CM | POA: Diagnosis not present

## 2011-05-11 DIAGNOSIS — R5381 Other malaise: Secondary | ICD-10-CM | POA: Diagnosis not present

## 2011-05-11 DIAGNOSIS — R4182 Altered mental status, unspecified: Secondary | ICD-10-CM | POA: Diagnosis not present

## 2011-05-11 DIAGNOSIS — M1711 Unilateral primary osteoarthritis, right knee: Secondary | ICD-10-CM

## 2011-05-11 DIAGNOSIS — Z96659 Presence of unspecified artificial knee joint: Secondary | ICD-10-CM

## 2011-05-11 HISTORY — PX: TOTAL KNEE ARTHROPLASTY: SHX125

## 2011-05-11 HISTORY — DX: Acute posthemorrhagic anemia: D62

## 2011-05-11 LAB — GLUCOSE, CAPILLARY
Glucose-Capillary: 132 mg/dL — ABNORMAL HIGH (ref 70–99)
Glucose-Capillary: 96 mg/dL (ref 70–99)
Glucose-Capillary: 214 mg/dL — ABNORMAL HIGH (ref 70–99)
Glucose-Capillary: 234 mg/dL — ABNORMAL HIGH (ref 70–99)

## 2011-05-11 SURGERY — ARTHROPLASTY, KNEE, TOTAL
Anesthesia: Spinal | Site: Knee | Laterality: Right | Wound class: Clean

## 2011-05-11 MED ORDER — WARFARIN VIDEO
Freq: Once | Status: AC
  Administered 2011-05-11: 20:00:00
  Filled 2011-05-11: qty 1

## 2011-05-11 MED ORDER — DIPHENHYDRAMINE HCL 50 MG/ML IJ SOLN: 12.5000 mg | Freq: Four times a day (QID) | INTRAMUSCULAR | Status: DC | PRN

## 2011-05-11 MED ORDER — BISACODYL 10 MG RE SUPP: 10.0000 mg | Freq: Every day | RECTAL | Status: DC | PRN

## 2011-05-11 MED ORDER — LACTATED RINGERS IV SOLN
INTRAVENOUS | Status: DC | PRN
  Administered 2011-05-11: 07:00:00 via INTRAVENOUS

## 2011-05-11 MED ORDER — METFORMIN HCL 500 MG PO TABS
1000.0000 mg | ORAL_TABLET | Freq: Every day | ORAL | Status: DC
  Administered 2011-05-14 – 2011-05-15 (×2): 1000 mg via ORAL
  Filled 2011-05-11 (×2): qty 2

## 2011-05-11 MED ORDER — AMLODIPINE BESYLATE 5 MG PO TABS
5.0000 mg | ORAL_TABLET | Freq: Every day | ORAL | Status: DC
  Administered 2011-05-12 – 2011-05-15 (×3): 5 mg via ORAL
  Filled 2011-05-11 (×4): qty 1

## 2011-05-11 MED ORDER — FENTANYL CITRATE 0.05 MG/ML IJ SOLN: 25.0000 ug | INTRAMUSCULAR | Status: DC | PRN

## 2011-05-11 MED ORDER — FENTANYL CITRATE 0.05 MG/ML IJ SOLN
INTRAMUSCULAR | Status: DC | PRN
  Administered 2011-05-11 (×2): 50 ug via INTRAVENOUS

## 2011-05-11 MED ORDER — ONDANSETRON HCL 4 MG PO TABS: 4.0000 mg | ORAL_TABLET | Freq: Four times a day (QID) | ORAL | Status: DC | PRN

## 2011-05-11 MED ORDER — CIPROFLOXACIN IN D5W 400 MG/200ML IV SOLN: 400.0000 mg | Freq: Once | INTRAVENOUS | Status: DC

## 2011-05-11 MED ORDER — WARFARIN SODIUM 5 MG PO TABS
5.0000 mg | ORAL_TABLET | Freq: Once | ORAL | Status: AC
  Administered 2011-05-11: 5 mg via ORAL
  Filled 2011-05-11: qty 1

## 2011-05-11 MED ORDER — CEFAZOLIN SODIUM 1-5 GM-% IV SOLN
1.0000 g | Freq: Once | INTRAVENOUS | Status: AC
  Administered 2011-05-11: 1 g via INTRAVENOUS

## 2011-05-11 MED ORDER — METHOCARBAMOL 500 MG PO TABS
500.0000 mg | ORAL_TABLET | Freq: Four times a day (QID) | ORAL | Status: DC | PRN
  Administered 2011-05-12 – 2011-05-15 (×5): 500 mg via ORAL
  Filled 2011-05-11 (×5): qty 1

## 2011-05-11 MED ORDER — CEFAZOLIN SODIUM 1-5 GM-% IV SOLN
1.0000 g | Freq: Four times a day (QID) | INTRAVENOUS | Status: AC
  Administered 2011-05-11 – 2011-05-12 (×3): 1 g via INTRAVENOUS
  Filled 2011-05-11 (×3): qty 50

## 2011-05-11 MED ORDER — PROPOFOL 10 MG/ML IV EMUL
INTRAVENOUS | Status: DC | PRN
  Administered 2011-05-11: 50 ug/kg/min via INTRAVENOUS

## 2011-05-11 MED ORDER — ONDANSETRON HCL 4 MG/2ML IJ SOLN: 4.0000 mg | Freq: Four times a day (QID) | INTRAMUSCULAR | Status: DC | PRN

## 2011-05-11 MED ORDER — LEVOTHYROXINE SODIUM 100 MCG PO TABS: 100.0000 ug | ORAL_TABLET | ORAL | Status: DC

## 2011-05-11 MED ORDER — NALOXONE HCL 0.4 MG/ML IJ SOLN: 0.4000 mg | INTRAMUSCULAR | Status: DC | PRN

## 2011-05-11 MED ORDER — ACETAMINOPHEN 10 MG/ML IV SOLN
1000.0000 mg | Freq: Four times a day (QID) | INTRAVENOUS | Status: AC
  Administered 2011-05-11 – 2011-05-12 (×4): 1000 mg via INTRAVENOUS
  Filled 2011-05-11 (×5): qty 100

## 2011-05-11 MED ORDER — MAGNESIUM HYDROXIDE 400 MG/5ML PO SUSP: 30.0000 mL | Freq: Two times a day (BID) | ORAL | Status: DC | PRN

## 2011-05-11 MED ORDER — SODIUM CHLORIDE 0.9 % IJ SOLN: 9.0000 mL | INTRAMUSCULAR | Status: DC | PRN

## 2011-05-11 MED ORDER — ZOLPIDEM TARTRATE 5 MG PO TABS
5.0000 mg | ORAL_TABLET | Freq: Every day | ORAL | Status: DC
  Administered 2011-05-11 – 2011-05-12 (×2): 5 mg via ORAL
  Filled 2011-05-11 (×3): qty 1

## 2011-05-11 MED ORDER — METFORMIN HCL 500 MG PO TABS
500.0000 mg | ORAL_TABLET | Freq: Every day | ORAL | Status: DC
  Filled 2011-05-11 (×2): qty 1

## 2011-05-11 MED ORDER — DIGOXIN 250 MCG PO TABS
0.2500 mg | ORAL_TABLET | ORAL | Status: DC
  Administered 2011-05-12 – 2011-05-15 (×3): 0.25 mg via ORAL
  Filled 2011-05-11 (×3): qty 1

## 2011-05-11 MED ORDER — EPHEDRINE SULFATE 50 MG/ML IJ SOLN
INTRAMUSCULAR | Status: DC | PRN
  Administered 2011-05-11 (×2): 5 mg via INTRAVENOUS

## 2011-05-11 MED ORDER — PHENOL 1.4 % MT LIQD: 1.0000 | OROMUCOSAL | Status: DC | PRN

## 2011-05-11 MED ORDER — ACETAMINOPHEN 325 MG PO TABS: 650.0000 mg | ORAL_TABLET | Freq: Four times a day (QID) | ORAL | Status: DC | PRN

## 2011-05-11 MED ORDER — ENOXAPARIN SODIUM 30 MG/0.3ML ~~LOC~~ SOLN
30.0000 mg | Freq: Two times a day (BID) | SUBCUTANEOUS | Status: DC
  Administered 2011-05-12 – 2011-05-14 (×5): 30 mg via SUBCUTANEOUS
  Filled 2011-05-11 (×8): qty 0.3

## 2011-05-11 MED ORDER — METFORMIN HCL 500 MG PO TABS
1000.0000 mg | ORAL_TABLET | Freq: Every day | ORAL | Status: DC
  Filled 2011-05-11: qty 2

## 2011-05-11 MED ORDER — METOCLOPRAMIDE HCL 5 MG/ML IJ SOLN: 5.0000 mg | Freq: Three times a day (TID) | INTRAMUSCULAR | Status: DC | PRN

## 2011-05-11 MED ORDER — MORPHINE SULFATE (PF) 1 MG/ML IV SOLN
INTRAVENOUS | Status: DC
  Administered 2011-05-11: 9 mg via INTRAVENOUS
  Administered 2011-05-11: 10 mg via INTRAVENOUS
  Administered 2011-05-11: 1 mg via INTRAVENOUS
  Administered 2011-05-12: 2 mg via INTRAVENOUS
  Filled 2011-05-11 (×2): qty 25

## 2011-05-11 MED ORDER — DIPHENHYDRAMINE HCL 12.5 MG/5ML PO ELIX: 12.5000 mg | ORAL_SOLUTION | Freq: Four times a day (QID) | ORAL | Status: DC | PRN

## 2011-05-11 MED ORDER — LEVOTHYROXINE SODIUM 150 MCG PO TABS
150.0000 ug | ORAL_TABLET | ORAL | Status: DC
  Administered 2011-05-13 – 2011-05-15 (×2): 150 ug via ORAL
  Filled 2011-05-11 (×2): qty 1

## 2011-05-11 MED ORDER — FAMOTIDINE 40 MG PO TABS
40.0000 mg | ORAL_TABLET | Freq: Every day | ORAL | Status: DC
  Administered 2011-05-11 – 2011-05-14 (×3): 40 mg via ORAL
  Filled 2011-05-11 (×6): qty 1

## 2011-05-11 MED ORDER — ACETAMINOPHEN 650 MG RE SUPP: 650.0000 mg | Freq: Four times a day (QID) | RECTAL | Status: DC | PRN

## 2011-05-11 MED ORDER — DIPHENHYDRAMINE HCL 12.5 MG/5ML PO ELIX: 12.5000 mg | ORAL_SOLUTION | ORAL | Status: DC | PRN

## 2011-05-11 MED ORDER — LIOTHYRONINE SODIUM 25 MCG PO TABS
25.0000 ug | ORAL_TABLET | Freq: Every day | ORAL | Status: DC
  Administered 2011-05-12 – 2011-05-15 (×4): 25 ug via ORAL
  Filled 2011-05-11 (×4): qty 1

## 2011-05-11 MED ORDER — LEVOTHYROXINE SODIUM 100 MCG PO TABS
100.0000 ug | ORAL_TABLET | ORAL | Status: DC
  Administered 2011-05-12 – 2011-05-14 (×2): 100 ug via ORAL
  Filled 2011-05-11 (×2): qty 1

## 2011-05-11 MED ORDER — INSULIN ASPART 100 UNIT/ML ~~LOC~~ SOLN
0.0000 [IU] | Freq: Three times a day (TID) | SUBCUTANEOUS | Status: DC
  Administered 2011-05-11: 5 [IU] via SUBCUTANEOUS
  Administered 2011-05-12 – 2011-05-13 (×5): 3 [IU] via SUBCUTANEOUS
  Administered 2011-05-14 (×3): 2 [IU] via SUBCUTANEOUS
  Filled 2011-05-11 (×4): qty 3

## 2011-05-11 MED ORDER — LATANOPROST 0.005 % OP SOLN
1.0000 [drp] | Freq: Every day | OPHTHALMIC | Status: DC
  Administered 2011-05-11 – 2011-05-14 (×4): 1 [drp] via OPHTHALMIC
  Filled 2011-05-11: qty 2.5

## 2011-05-11 MED ORDER — COUMADIN BOOK
Freq: Once | Status: AC
  Administered 2011-05-11: 20:00:00
  Filled 2011-05-11: qty 1

## 2011-05-11 MED ORDER — METHOCARBAMOL 100 MG/ML IJ SOLN
500.0000 mg | Freq: Four times a day (QID) | INTRAVENOUS | Status: DC | PRN
  Administered 2011-05-11: 500 mg via INTRAVENOUS
  Filled 2011-05-11: qty 5

## 2011-05-11 MED ORDER — POTASSIUM CHLORIDE IN NACL 20-0.9 MEQ/L-% IV SOLN
INTRAVENOUS | Status: DC
  Administered 2011-05-11: 14:00:00 via INTRAVENOUS
  Administered 2011-05-12: 20 mL/h via INTRAVENOUS
  Administered 2011-05-13: 05:00:00 via INTRAVENOUS
  Filled 2011-05-11 (×7): qty 1000

## 2011-05-11 MED ORDER — MIDAZOLAM HCL 5 MG/5ML IJ SOLN
INTRAMUSCULAR | Status: DC | PRN
  Administered 2011-05-11 (×2): 1 mg via INTRAVENOUS

## 2011-05-11 MED ORDER — BUPIVACAINE ON-Q PAIN PUMP (FOR ORDER SET NO CHG): INJECTION | Status: DC

## 2011-05-11 MED ORDER — CIPROFLOXACIN IN D5W 400 MG/200ML IV SOLN
INTRAVENOUS | Status: DC | PRN
  Administered 2011-05-11: 400 mg via INTRAVENOUS

## 2011-05-11 MED ORDER — MENTHOL 3 MG MT LOZG: 1.0000 | LOZENGE | OROMUCOSAL | Status: DC | PRN

## 2011-05-11 MED ORDER — DIGOXIN 250 MCG PO TABS
0.5000 mg | ORAL_TABLET | ORAL | Status: DC
  Administered 2011-05-13: 0.5 mg via ORAL
  Filled 2011-05-11: qty 2

## 2011-05-11 MED ORDER — FLEET ENEMA 7-19 GM/118ML RE ENEM: 1.0000 | ENEMA | Freq: Every day | RECTAL | Status: DC | PRN

## 2011-05-11 MED ORDER — ACETAMINOPHEN 10 MG/ML IV SOLN
INTRAVENOUS | Status: DC | PRN
  Administered 2011-05-11: 1000 mg via INTRAVENOUS

## 2011-05-11 MED ORDER — METOCLOPRAMIDE HCL 10 MG PO TABS: 5.0000 mg | ORAL_TABLET | Freq: Three times a day (TID) | ORAL | Status: DC | PRN

## 2011-05-11 MED ORDER — METOPROLOL SUCCINATE ER 25 MG PO TB24
25.0000 mg | ORAL_TABLET | Freq: Every day | ORAL | Status: DC
  Administered 2011-05-12 – 2011-05-15 (×4): 25 mg via ORAL
  Filled 2011-05-11 (×4): qty 1

## 2011-05-11 MED ORDER — BISACODYL 5 MG PO TBEC: 10.0000 mg | DELAYED_RELEASE_TABLET | Freq: Every day | ORAL | Status: DC | PRN

## 2011-05-11 MED ORDER — ONDANSETRON HCL 4 MG/2ML IJ SOLN
INTRAMUSCULAR | Status: DC | PRN
  Administered 2011-05-11: 4 mg via INTRAVENOUS

## 2011-05-11 MED ORDER — POLYETHYLENE GLYCOL 3350 17 G PO PACK
17.0000 g | PACK | Freq: Every day | ORAL | Status: DC | PRN
  Filled 2011-05-11: qty 1

## 2011-05-11 MED ORDER — LACTATED RINGERS IV SOLN: INTRAVENOUS | Status: DC

## 2011-05-11 MED ORDER — METOCLOPRAMIDE HCL 5 MG/ML IJ SOLN: 10.0000 mg | Freq: Once | INTRAMUSCULAR | Status: DC | PRN

## 2011-05-11 MED ORDER — BUPIVACAINE HCL 0.75 % IJ SOLN
INTRAMUSCULAR | Status: DC | PRN
  Administered 2011-05-11: 2 mL

## 2011-05-11 MED ORDER — OXYCODONE HCL 5 MG PO TABS
5.0000 mg | ORAL_TABLET | ORAL | Status: DC | PRN
  Administered 2011-05-12 (×4): 5 mg via ORAL
  Administered 2011-05-13: 10 mg via ORAL
  Filled 2011-05-11 (×4): qty 1
  Filled 2011-05-11: qty 2

## 2011-05-11 MED ORDER — DOCUSATE SODIUM 100 MG PO CAPS
100.0000 mg | ORAL_CAPSULE | Freq: Two times a day (BID) | ORAL | Status: DC
  Administered 2011-05-11 – 2011-05-15 (×9): 100 mg via ORAL
  Filled 2011-05-11 (×11): qty 1

## 2011-05-11 MED ORDER — ALPRAZOLAM 0.25 MG PO TABS
0.2500 mg | ORAL_TABLET | Freq: Every day | ORAL | Status: DC
  Administered 2011-05-11 – 2011-05-14 (×4): 0.25 mg via ORAL
  Filled 2011-05-11 (×6): qty 1

## 2011-05-11 MED ORDER — DIGOXIN 250 MCG PO TABS: 0.2500 mg | ORAL_TABLET | ORAL | Status: DC

## 2011-05-11 MED ORDER — METFORMIN HCL 500 MG PO TABS: 500.0000 mg | ORAL_TABLET | Freq: Two times a day (BID) | ORAL | Status: DC

## 2011-05-11 SURGICAL SUPPLY — 54 items
BAG SPEC THK2 15X12 ZIP CLS (MISCELLANEOUS) ×1
BAG ZIPLOCK 12X15 (MISCELLANEOUS) ×2 IMPLANT
BANDAGE ELASTIC 6 VELCRO ST LF (GAUZE/BANDAGES/DRESSINGS) ×2 IMPLANT
BANDAGE ESMARK 6X9 LF (GAUZE/BANDAGES/DRESSINGS) ×1 IMPLANT
BLADE SAG 18X100X1.27 (BLADE) ×2 IMPLANT
BLADE SAW SGTL 11.0X1.19X90.0M (BLADE) ×2 IMPLANT
BNDG CMPR 9X6 STRL LF SNTH (GAUZE/BANDAGES/DRESSINGS) ×1
BNDG ESMARK 6X9 LF (GAUZE/BANDAGES/DRESSINGS) ×2
BOWL SMART MIX CTS (DISPOSABLE) ×2 IMPLANT
CATH KIT ON-Q SILVERSOAK 5 (CATHETERS) ×1 IMPLANT
CATH KIT ON-Q SILVERSOAK 5IN (CATHETERS) ×2 IMPLANT
CEMENT HV SMART SET (Cement) ×4 IMPLANT
CLOTH BEACON ORANGE TIMEOUT ST (SAFETY) ×2 IMPLANT
CUFF TOURN SGL QUICK 34 (TOURNIQUET CUFF) ×2
CUFF TRNQT CYL 34X4X40X1 (TOURNIQUET CUFF) ×1 IMPLANT
DRAPE EXTREMITY T 121X128X90 (DRAPE) ×2 IMPLANT
DRAPE POUCH INSTRU U-SHP 10X18 (DRAPES) ×2 IMPLANT
DRAPE U-SHAPE 47X51 STRL (DRAPES) ×2 IMPLANT
DRSG ADAPTIC 3X8 NADH LF (GAUZE/BANDAGES/DRESSINGS) ×2 IMPLANT
DURAPREP 26ML APPLICATOR (WOUND CARE) ×2 IMPLANT
ELECT REM PT RETURN 9FT ADLT (ELECTROSURGICAL) ×2
ELECTRODE REM PT RTRN 9FT ADLT (ELECTROSURGICAL) ×1 IMPLANT
EVACUATOR 1/8 PVC DRAIN (DRAIN) ×2 IMPLANT
FACESHIELD LNG OPTICON STERILE (SAFETY) ×10 IMPLANT
GAUZE SPONGE 4X4 12PLY STRL LF (GAUZE/BANDAGES/DRESSINGS) ×1 IMPLANT
GLOVE BIO SURGEON STRL SZ7.5 (GLOVE) ×2 IMPLANT
GLOVE BIO SURGEON STRL SZ8 (GLOVE) ×2 IMPLANT
GLOVE BIOGEL PI IND STRL 8 (GLOVE) ×2 IMPLANT
GLOVE BIOGEL PI INDICATOR 8 (GLOVE) ×2
GOWN PREVENTION PLUS XLARGE (GOWN DISPOSABLE) ×2 IMPLANT
GOWN STRL REIN XL XLG (GOWN DISPOSABLE) ×2 IMPLANT
HANDPIECE INTERPULSE COAX TIP (DISPOSABLE) ×2
IMMOBILIZER KNEE 20 (SOFTGOODS) ×2
IMMOBILIZER KNEE 20 THIGH 36 (SOFTGOODS) ×1 IMPLANT
KIT BASIN OR (CUSTOM PROCEDURE TRAY) ×2 IMPLANT
MANIFOLD NEPTUNE II (INSTRUMENTS) ×2 IMPLANT
NS IRRIG 1000ML POUR BTL (IV SOLUTION) ×2 IMPLANT
PACK TOTAL JOINT (CUSTOM PROCEDURE TRAY) ×2 IMPLANT
PAD ABD 7.5X8 STRL (GAUZE/BANDAGES/DRESSINGS) ×2 IMPLANT
PADDING CAST COTTON 6X4 STRL (CAST SUPPLIES) ×6 IMPLANT
PADDING WEBRIL 6 STERILE (GAUZE/BANDAGES/DRESSINGS) ×1 IMPLANT
POSITIONER SURGICAL ARM (MISCELLANEOUS) ×2 IMPLANT
SET HNDPC FAN SPRY TIP SCT (DISPOSABLE) ×1 IMPLANT
SPONGE GAUZE 4X4 12PLY (GAUZE/BANDAGES/DRESSINGS) ×2 IMPLANT
STRIP CLOSURE SKIN 1/2X4 (GAUZE/BANDAGES/DRESSINGS) ×4 IMPLANT
SUCTION FRAZIER 12FR DISP (SUCTIONS) ×2 IMPLANT
SUT MNCRL AB 4-0 PS2 18 (SUTURE) ×2 IMPLANT
SUT PDS AB 1 CT1 27 (SUTURE) ×6 IMPLANT
SUT VIC AB 2-0 CT1 27 (SUTURE) ×6
SUT VIC AB 2-0 CT1 TAPERPNT 27 (SUTURE) ×3 IMPLANT
TOWEL OR 17X26 10 PK STRL BLUE (TOWEL DISPOSABLE) ×4 IMPLANT
TRAY FOLEY CATH 14FRSI W/METER (CATHETERS) ×2 IMPLANT
WATER STERILE IRR 1500ML POUR (IV SOLUTION) ×2 IMPLANT
WRAP KNEE MAXI GEL POST OP (GAUZE/BANDAGES/DRESSINGS) ×4 IMPLANT

## 2011-05-11 NOTE — Preoperative (Signed)
Beta Blockers   Reason not to administer Beta Blockers:25 mg Metoprolol this morning

## 2011-05-11 NOTE — Progress Notes (Signed)
ANTICOAGULATION CONSULT NOTE - Initial Consult  Pharmacy Consult for: Coumadin Indication: VTE prophylaxis s/p R TKA  Allergies  Allergen Reactions  . Lipitor (Atorvastatin Calcium)   . Zetia (Ezetimibe)    Labs: No results found for this basename: HGB:2,HCT:3,PLT:3,APTT:3,LABPROT:3,INR:3,HEPARINUNFRC:3,CREATININE:3,CKTOTAL:3,CKMB:3,TROPONINI:3 in the last 72 hours The CrCl is unknown because both a height and weight (above a minimum accepted value) are required for this calculation.  Medical History: Past Medical History  Diagnosis Date  . Hypertension   . Hyperlipidemia   . Diabetes mellitus   . Hypothyroidism   . Macular degeneration   . OA (osteoarthritis)   . Anxiety   . Depression   . PVC's (premature ventricular contractions)   . Hx: UTI (urinary tract infection)   . Insomnia   . Stroke     mid 1990s   . Pneumonia   . Chronic kidney disease     hx of uti due to catheter hx of prostatitis, BPH -   . Cancer     basal and squamous skin carcinoma     Medications:  Prescriptions prior to admission  Medication Sig Dispense Refill  . ALPRAZolam (XANAX) 0.25 MG tablet Take 0.25 mg by mouth at bedtime.       Marland Kitchen amLODipine (NORVASC) 5 MG tablet Take 5 mg by mouth every morning.       Marland Kitchen aspirin 81 MG EC tablet Take 81 mg by mouth every morning.       . Bevacizumab (AVASTIN IV) Place into the right eye as directed. Every 5 weeks      . clopidogrel (PLAVIX) 75 MG tablet Take 1 tablet (75 mg total) by mouth daily.  90 tablet  3  . digoxin (LANOXIN) 0.25 MG tablet Take 0.25-0.5 mg by mouth every morning. 1 tablet Monday, Tuesday, Thursday, Friday, and Saturday.. 2 tablet on Sunday and wednesday      . famotidine (PEPCID) 40 MG tablet Take 40 mg by mouth daily.       Marland Kitchen latanoprost (XALATAN) 0.005 % ophthalmic solution Place 1 drop into both eyes at bedtime.       Marland Kitchen levothyroxine (SYNTHROID, LEVOTHROID) 100 MCG tablet Take 100-150 mcg by mouth every morning. 1 tablet on Sunday,  Tuesday, Thursday, and Saturday.  1.5 tablet on Monday, Wednesday, and Friday.      Marland Kitchen liothyronine (CYTOMEL) 25 MCG tablet Take 25 mcg by mouth every morning. 1 tablet 6 days a week. Pt does not take on sunday      . metFORMIN (GLUCOPHAGE) 1000 MG tablet Take 500-1,000 mg by mouth 2 (two) times daily with a meal. 1000 mg in the morning and 500 mg in the pm      . metoprolol succinate (TOPROL-XL) 25 MG 24 hr tablet Take 25 mg by mouth every morning.       . zolpidem (AMBIEN) 5 MG tablet Take 5 mg by mouth at bedtime.       Marland Kitchen OVER THE COUNTER MEDICATION Take 1 tablet by mouth 2 (two) times daily. Mega vitamin - for macular degeneration      . sildenafil (VIAGRA) 100 MG tablet Take 100 mg by mouth as directed.        . thiamine (VITAMIN B-1) 100 MG tablet Take 100 mg by mouth daily.        Scheduled:    . acetaminophen  1,000 mg Intravenous Q6H  . ALPRAZolam  0.25 mg Oral QHS  . amLODipine  5 mg Oral Daily  . ceFAZolin (ANCEF) IV  1 g Intravenous Once  . ceFAZolin (ANCEF) IV  1 g Intravenous Q6H  . digoxin  0.25-0.5 mg Oral QAM  . docusate sodium  100 mg Oral BID  . enoxaparin  30 mg Subcutaneous Q12H  . famotidine  40 mg Oral Daily  . insulin aspart  0-15 Units Subcutaneous TID WC  . latanoprost  1 drop Both Eyes QHS  . levothyroxine  100-150 mcg Oral QAM  . liothyronine  25 mcg Oral Daily  . metFORMIN  500-1,000 mg Oral BID WC  . metoprolol succinate  25 mg Oral Daily  . morphine   Intravenous Q4H  . zolpidem  5 mg Oral QHS  . DISCONTD: ciprofloxacin  400 mg Intravenous Once   Anti-infectives     Start     Dose/Rate Route Frequency Ordered Stop   05/11/11 1330   ceFAZolin (ANCEF) IVPB 1 g/50 mL premix        1 g 100 mL/hr over 30 Minutes Intravenous Every 6 hours 05/11/11 1054 05/12/11 0729   05/11/11 0715   ciprofloxacin (CIPRO) IVPB 400 mg  Status:  Discontinued        400 mg 200 mL/hr over 60 Minutes Intravenous  Once 05/11/11 0702 05/11/11 1054   05/11/11 0615   ceFAZolin  (ANCEF) IVPB 1 g/50 mL premix        1 g 100 mL/hr over 30 Minutes Intravenous  Once 05/11/11 0556 05/11/11 0715          Assessment:  77 yo male s/p R TKA  Baseline PT/INR WNL  Noted urologist requested Cipro post-op,Cipro discontinued. Quinolones can increase PT/INR  Also on Metformin, SCr WNL, no change necessary  Concurrent Plavix,ASA 81mg  enteric-coated daily (hx CVA) Goal of Therapy:  INR 2-3   Plan:  Lovenox 30mg  SQ q12 until INR >/= 2 Begin Coumadin 5mg  today, daily protimes.  Kamareon Sciandra L 05/11/2011,11:24 AM

## 2011-05-11 NOTE — Anesthesia Preprocedure Evaluation (Addendum)
Anesthesia Evaluation  Patient identified by MRN, date of birth, ID band Patient awake    Reviewed: Allergy & Precautions, H&P , NPO status , Patient's Chart, lab work & pertinent test results  Airway Mallampati: II TM Distance: >3 FB Neck ROM: Full    Dental No notable dental hx.    Pulmonary neg pulmonary ROS, pneumonia ,  clear to auscultation  Pulmonary exam normal       Cardiovascular hypertension, + CAD and neg cardio ROS Regular Normal    Neuro/Psych TIANegative Neurological ROS  Negative Psych ROS   GI/Hepatic negative GI ROS, Neg liver ROS,   Endo/Other  Negative Endocrine ROS  Renal/GU negative Renal ROS  Genitourinary negative   Musculoskeletal negative musculoskeletal ROS (+)   Abdominal   Peds negative pediatric ROS (+)  Hematology negative hematology ROS (+)   Anesthesia Other Findings   Reproductive/Obstetrics negative OB ROS                          Anesthesia Physical Anesthesia Plan  ASA: III  Anesthesia Plan: Spinal   Post-op Pain Management:    Induction:   Airway Management Planned:   Additional Equipment:   Intra-op Plan:   Post-operative Plan:   Informed Consent: I have reviewed the patients History and Physical, chart, labs and discussed the procedure including the risks, benefits and alternatives for the proposed anesthesia with the patient or authorized representative who has indicated his/her understanding and acceptance.     Plan Discussed with:   Anesthesia Plan Comments: (Off plavix for 5 days; strongly prefers spinal; had spinal for other TKA few months ago without problem)        Anesthesia Quick Evaluation

## 2011-05-11 NOTE — Anesthesia Postprocedure Evaluation (Signed)
  Anesthesia Post-op Note  Patient: Stephen Wilkinson  Procedure(s) Performed:  TOTAL KNEE ARTHROPLASTY  Patient Location: PACU  Anesthesia Type: Spinal  Level of Consciousness: awake and alert   Airway and Oxygen Therapy: Patient Spontanous Breathing  Post-op Pain: mild  Post-op Assessment: Post-op Vital signs reviewed, Patient's Cardiovascular Status Stable, Respiratory Function Stable, Patent Airway and No signs of Nausea or vomiting  Post-op Vital Signs: stable  Complications: No apparent anesthesia complications. Moving both feet fine. No back pain. Discussed need to alert medical team if any back pain.

## 2011-05-11 NOTE — Anesthesia Procedure Notes (Signed)
Spinal  Patient location during procedure: OR Start time: 05/11/2011 7:31 AM End time: 05/11/2011 7:33 AM Staffing Anesthesiologist: Azell Der Performed by: anesthesiologist  Preanesthetic Checklist Completed: patient identified, site marked, surgical consent, pre-op evaluation, timeout performed, IV checked, risks and benefits discussed and monitors and equipment checked Spinal Block Patient position: sitting Prep: Betadine Patient monitoring: heart rate, continuous pulse ox and blood pressure Approach: midline Injection technique: single-shot Needle Needle type: Spinocan and Sprotte  Needle gauge: 24 G Needle length: 9 cm Additional Notes Expiration date of kit checked and confirmed. Patient tolerated procedure well, without complications. Discussed risk of epidural hematoma

## 2011-05-11 NOTE — Transfer of Care (Signed)
Immediate Anesthesia Transfer of Care Note  Patient: Stephen Wilkinson  Procedure(s) Performed:  TOTAL KNEE ARTHROPLASTY  Patient Location: PACU  Anesthesia Type: MAC and Spinal  Level of Consciousness: awake, alert , oriented and patient cooperative  Airway & Oxygen Therapy: Patient Spontanous Breathing and Patient connected to face mask oxygen  Post-op Assessment: Report given to PACU RN and Post -op Vital signs reviewed and stable  Post vital signs: Reviewed and stable  Complications: No apparent anesthesia complications

## 2011-05-11 NOTE — H&P (View-Only) (Signed)
Stephen Wilkinson  DOB: 06/25/1928  Date of Admission: 05/11/2011  Chief Complaint:  Right Knee Pain  History of Present Illness The patient is a 77 year old male who comes in today for a preoperative History and Physical. The patient is scheduled for a right total knee arthroplasty to be performed by Dr. Frank V. Aluisio, MD at [facility] on 05/11/2011.They have been seen for right knee pain and known arthritis. He has done well with his first knee replacement and now presents for the other side.  Allergies Lipitor Zetia  Medication History ALPRAZolam (0.25MG Tablet, Oral) Active. Aspirin EC (81MG Tablet DR, Oral daily) Active. AmLODIPine Besylate (5MG Tablet, Oral) Active. Avastin (400MG/16ML Solution, Intravenous once every 5 weeks) Active. Vit Balanced B-100 ( Oral daily) Active. Cytomel (25MCG Tablet, Oral daily except Sunday) Active. Digoxin (0.25MG Tablet, Oral) Active. Famotidine (40MG Tablet, Oral) Active. Mega Vite 75 ( Oral daily) Active. MetFORMIN HCl (1000MG Tablet, Oral) Active. Metoprolol Succinate (25MG Tablet ER 24HR, Oral) Active. Plavix (75MG Tablet, Oral daily) Active. Synthroid (100MCG Tablet, Oral) Active. Xalatan (0.005% Solution, Ophthalmic at bedtime) Active. Zolpidem Tartrate (5MG Tablet, Oral) Active.  Problem List/Past Medical Stroke Impaired Vision Anxiety Disorder History of Depression Macular Degeneration. Left Eye Legal Blindness, Partial Loss with Right Eye Glaucoma Cataract Chronic Bronchitis Hypertension Coronary Artery Disease/Heart Disease Hypercholesterolemia/Dyslipidemia Arrhythmia Enlarged prostate Urinary Tract Infection. Past History Hypothyroidism Non-Insulin Dependent Diabetes Mellitus Skin Cancer Measles Mumps  Past Surgical History Tonsillectomy Thyroidectomy; Subtotal. Partial Total Knee Replacement - Left Open Inguinal Hernia Surgery - Both Microwave Prostate Procedure  Family  History Father. Deceased, Cerebrovascular Accident, Hypertension. Age 89 Mother. Deceased. Diabetes, Hypertension, Arthritis, Age 96  Social History Advance Directives. Living Will, Healthcare POA Post-Surgical Plans. Plan to go home. Marital status. Married. Current work status. Retired. Tobacco use. Never smoker. Alcohol use. Occasional alcohol use. 1-2 drinks per day  Review of Systems General:Not Present- Chills, Fever, Night Sweats, Fatigue, Weight Gain, Weight Loss and Memory Loss. Skin:Not Present- Hives, Itching, Rash, Eczema and Lesions. HEENT:Not Present- Tinnitus, Headache, Double Vision, Visual Loss, Hearing Loss and Dentures. Respiratory:Not Present- Shortness of breath with exertion, Shortness of breath at rest, Allergies, Coughing up blood and Chronic Cough. Cardiovascular:Not Present- Chest Pain, Racing/skipping heartbeats, Difficulty Breathing Lying Down, Murmur, Swelling and Palpitations. Gastrointestinal:Not Present- Bloody Stool, Heartburn, Abdominal Pain, Vomiting, Nausea, Constipation, Diarrhea, Difficulty Swallowing, Jaundice and Loss of appetitie. Male Genitourinary:Present- Urinary frequency and Excessive Urination at Night. Not Present- Blood in Urine, Weak urinary stream, Discharge, Flank Pain, Incontinence, Painful Urination, Urgency, Urinary Retention and Urinating at Night. Musculoskeletal:Not Present- Muscle Weakness, Muscle Pain, Joint Swelling, Joint Pain, Back Pain, Morning Stiffness and Spasms. Neurological:Not Present- Tremor, Dizziness, Blackout spells, Paralysis, Difficulty with balance and Weakness. Psychiatric:Not Present- Insomnia.  Vitals Weight: 160 lb Height: 72 in Weight was reported by patient. Height was reported by patient. Body Surface Area: 1.92 m Body Mass Index: 21.7 kg/m Pulse: 60 (Regular) Resp.: 14 (Unlabored) BP: 152/72 (Sitting, Left Arm, Standard)  Physical Exam The physical exam findings are  as follows:  General Mental Status - Alert, cooperative and good historian. General Appearance- pleasant. Not in acute distress. Orientation- Oriented X3. Build & Nutrition- Well nourished and Well developed. Note: Good Historian, very pleasant.  Head and Neck Head- normocephalic, atraumatic . Neck Global Assessment- supple. no bruit auscultated on the right and no bruit auscultated on the left.  Eye Pupil- Bilateral- Regular and Round. Note: Legally blind in left eye but does have peripheral vision. Right eye   decreased vision but adequate and can read with right eye. Motion- Bilateral- EOMI.  Chest and Lung Exam Auscultation: Breath sounds:- clear at anterior chest wall and - clear at posterior chest wall. Adventitious sounds:- No Adventitious sounds.  Cardiovascular Auscultation:Rhythm- Regular rate and rhythm. Heart Sounds- S1 WNL and S2 WNL. Murmurs & Other Heart Sounds:Auscultation of the heart reveals - No Murmurs.  Abdomen Palpation/Percussion:Tenderness- Abdomen is non-tender to palpation. Rigidity (guarding)- Abdomen is soft. Auscultation:Auscultation of the abdomen reveals - Bowel sounds normal.  Male Genitourinary Note: Not done, not pertinent to present illness  Musculoskeletal Note: Knee shows no effusion. He has the varus deformity. He has crepitation throughout the ROM, tenderness to the joint line to palpation.  Assessment & Plan Osteoarthrosis Right Knee  Note: Plan is for a right total knee arthroplasty per Dr. Aluisio. Risks and benefits of the surgery have been discussed with the patient and they elect to proceed with surgery.  There are on active contraindications to upcoming procedure such as ongoing infection or progressive neurological disease. Please note that the patient's Urologist wanted him to be started on Cipro 500 mg twice a day right after his surgery during his hospital course.  He has been seen by his  cardiologist Dr. Brackbill preoperatively and felt to be stable for upcoming surgery.  Drew Bria [place], PA-C   

## 2011-05-11 NOTE — Op Note (Signed)
Pre-operative diagnosis- Osteoarthritis  Right knee(s)  Post-operative diagnosis- Osteoarthritis Right knee(s)  Surgeon- Gus Rankin. Chevi Lim, MD  Assistant- Avel Peace, PA-C   Anesthesia-  Spinal EBL-* No blood loss amount entered *  Drains Hemovac  Tourniquet time-  Total Tourniquet Time Documented: Thigh (Right) - 36 minutes   Complications- None  Condition-PACU - hemodynamically stable.   Brief Clinical Note   Stephen Wilkinson is a 77 y.o. year old male with end stage OA of his rightt knee with progressively worsening pain and dysfunction. He has constant pain, with activity and at rest and significant functional deficits with difficulties even with ADLs. He has had extensive non-op management including analgesics, injections of cortisone and viscosupplements, and home exercise program, but remains in significant pain with significant dysfunction. Radiographs show bone on bone arthritis of the medial and patellofemoral compartments with joint subluxation. He presents now for right Total Knee Arthroplasty.    Procedure in detail---   The patient is brought into the operating room and positioned supine on the operating table. After successful administration of  Spinal,   a tourniquet is placed high on the  Right thigh(s) and the lower extremity is prepped and draped in the usual sterile fashion. Time out is performed by the operating team and then the  Right lower extremity is wrapped in Esmarch, knee flexed and the tourniquet inflated to 300 mmHg.       A midline incision is made with a ten blade through the subcutaneous tissue to the level of the extensor mechanism. A fresh blade is used to make a medial parapatellar arthrotomy. Soft tissue over the proximal medial tibia is subperiosteally elevated to the joint line with a knife and into the semimembranosus bursa with a Cobb elevator. Soft tissue over the proximal lateral tibia is elevated with attention being paid to avoiding the patellar tendon  on the tibial tubercle. The patella is everted, knee flexed 90 degrees and the ACL and PCL are removed. Findings are bone on bone medial and patellofemoral with large marginal osteophytes        The drill is used to create a starting hole in the distal femur and the canal is thoroughly irrigated with sterile saline to remove the fatty contents. The 5 degree Right  valgus alignment guide is placed into the femoral canal and the distal femoral cutting block is pinned to remove 11 mm off the distal femur. Resection is made with an oscillating saw.      The tibia is subluxed forward and the menisci are removed. The extramedullary alignment guide is placed referencing proximally at the medial aspect of the tibial tubercle and distally along the second metatarsal axis and tibial crest. The block is pinned to remove 2mm off the more deficient medial  side. Resection is made with an oscillating saw. Size 5is the most appropriate size for the tibia and the proximal tibia is prepared with the modular drill and keel punch for that size.      The femoral sizing guide is placed and size 5 is most appropriate. Rotation is marked off the epicondylar axis and confirmed by creating a rectangular flexion gap at 90 degrees. The size 5 cutting block is pinned in this rotation and the anterior, posterior and chamfer cuts are made with the oscillating saw. The intercondylar block is then placed and that cut is made.      Trial size 5 tibial component, trial size 5 posterior stabilized femur and a 12.5  mm posterior  stabilized rotating platform insert trial is placed. Full extension is achieved with excellent varus/valgus and anterior/posterior balance throughout full range of motion. The patella is everted and thickness measured to be 27  mm. Free hand resection is taken to 15 mm, a 41 template is placed, lug holes are drilled, trial patella is placed, and it tracks normally. Osteophytes are removed off the posterior femur with the  trial in place. All trials are removed and the cut bone surfaces prepared with pulsatile lavage. Cement is mixed and once ready for implantation, the size 5 tibial implant, size  5 posterior stabilized femoral component, and the size 41 patella are cemented in place and the patella is held with the clamp. The trial insert is placed and the knee held in full extension. All extruded cement is removed and once the cement is hard the permanent 12.5 mm posterior stabilized rotating platform insert is placed into the tibial tray.      The wound is copiously irrigated with saline solution and the extensor mechanism closed over a hemovac drain with #1 PDS suture. The tourniquet is released for a total tourniquet time of 36  minutes. Flexion against gravity is 140 degrees and the patella tracks normally. Subcutaneous tissue is closed with 2.0 vicryl and subcuticular with running 4.0 Monocryl. The catheter for the Marcaine pain pump is placed and the pump is initiated. The incision is cleaned and dried and steri-strips and a bulky sterile dressing are applied. The limb is placed into a knee immobilizer and the patient is awakened and transported to recovery in stable condition.      Please note that a surgical assistant was a medical necessity for this procedure in order to perform it in a safe and expeditious manner. Surgical assistant was necessary to retract the ligaments and vital neurovascular structures to prevent injury to them and also necessary for proper positioning of the limb to allow for anatomic placement of the prosthesis.   Gus Rankin Lillis Nuttle, MD    05/11/2011, 8:32 AM

## 2011-05-11 NOTE — Progress Notes (Signed)
Utilization review completed.  

## 2011-05-11 NOTE — Interval H&P Note (Signed)
History and Physical Interval Note:   05/11/2011   6:42 AM   Stephen Wilkinson  has presented today for surgery, with the diagnosis of osteoarthritis right knee  The various methods of treatment have been discussed with the patient and family. After consideration of risks, benefits and other options for treatment, the patient has consented to  Procedure(s): TOTAL KNEE ARTHROPLASTY as a surgical intervention .  The patients' history has been reviewed, patient examined, no change in status, stable for surgery.  I have reviewed the patients' chart and labs.  Questions were answered to the patient's satisfaction.     Loanne Drilling  MD

## 2011-05-12 DIAGNOSIS — M1711 Unilateral primary osteoarthritis, right knee: Secondary | ICD-10-CM | POA: Diagnosis present

## 2011-05-12 LAB — CBC
HCT: 32.2 % — ABNORMAL LOW (ref 39.0–52.0)
Hemoglobin: 10.9 g/dL — ABNORMAL LOW (ref 13.0–17.0)
MCH: 31.6 pg (ref 26.0–34.0)
MCV: 93.3 fL (ref 78.0–100.0)
Platelets: 167 10*3/uL (ref 150–400)
RBC: 3.45 MIL/uL — ABNORMAL LOW (ref 4.22–5.81)
WBC: 10.6 10*3/uL — ABNORMAL HIGH (ref 4.0–10.5)

## 2011-05-12 LAB — BASIC METABOLIC PANEL
CO2: 27 mEq/L (ref 19–32)
Calcium: 8.8 mg/dL (ref 8.4–10.5)
Chloride: 102 mEq/L (ref 96–112)
Creatinine, Ser: 0.96 mg/dL (ref 0.50–1.35)
Glucose, Bld: 203 mg/dL — ABNORMAL HIGH (ref 70–99)

## 2011-05-12 LAB — GLUCOSE, CAPILLARY
Glucose-Capillary: 163 mg/dL — ABNORMAL HIGH (ref 70–99)
Glucose-Capillary: 184 mg/dL — ABNORMAL HIGH (ref 70–99)
Glucose-Capillary: 155 mg/dL — ABNORMAL HIGH (ref 70–99)

## 2011-05-12 MED ORDER — WARFARIN SODIUM 5 MG PO TABS
5.0000 mg | ORAL_TABLET | Freq: Once | ORAL | Status: AC
  Administered 2011-05-12: 5 mg via ORAL
  Filled 2011-05-12: qty 1

## 2011-05-12 MED ORDER — VITAMINS A & D EX OINT
TOPICAL_OINTMENT | CUTANEOUS | Status: AC
  Administered 2011-05-12: 5
  Filled 2011-05-12: qty 5

## 2011-05-12 MED ORDER — METFORMIN HCL 500 MG PO TABS
500.0000 mg | ORAL_TABLET | Freq: Every day | ORAL | Status: DC
  Administered 2011-05-14: 500 mg via ORAL
  Filled 2011-05-12 (×2): qty 1

## 2011-05-12 MED ORDER — HYDROMORPHONE HCL PF 2 MG/ML IJ SOLN: 0.5000 mg | INTRAMUSCULAR | Status: DC | PRN

## 2011-05-12 NOTE — Progress Notes (Signed)
Subjective: 1 Day Post-Op Procedure(s) (LRB): TOTAL KNEE ARTHROPLASTY (Right) Patient reports pain as mild and moderate.   Patient seen in rounds with Dr. Lequita Halt. Patient has complaints of moderate pain and not much sleep last night but better this morning. We will start therapy today. Plan is to go home after hospital stay.  Wife is concerned that she may not be able to help him at home sur to her back issues so we will also look in possible stay at SNF such as [facility] if needed depending upon patient progress. Good UOP  Objective: Vital signs in last 24 hours: Temp:  [96.3 F (35.7 C)-99.4 F (37.4 C)] 98.7 F (37.1 C) (11/27 0610) Pulse Rate:  [35-82] 82  (11/27 0610) Resp:  [9-18] 18  (11/27 0610) BP: (102-166)/(44-85) 159/85 mmHg (11/27 0610) SpO2:  [94 %-100 %] 98 % (11/27 0610)  Intake/Output from previous day:  Intake/Output Summary (Last 24 hours) at 05/12/11 0848 Last data filed at 05/12/11 0700  Gross per 24 hour  Intake   1990 ml  Output   1835 ml  Net    155 ml    Intake/Output this shift:    Labs: Results for orders placed during the hospital encounter of 05/11/11  TYPE AND SCREEN      Component Value Range   ABO/RH(D) O POS     Antibody Screen NEG     Sample Expiration 05/14/2011    GLUCOSE, CAPILLARY      Component Value Range   Glucose-Capillary 132 (*) 70 - 99 (mg/dL)  GLUCOSE, CAPILLARY      Component Value Range   Glucose-Capillary 96  70 - 99 (mg/dL)  GLUCOSE, CAPILLARY      Component Value Range   Glucose-Capillary 141 (*) 70 - 99 (mg/dL)  GLUCOSE, CAPILLARY      Component Value Range   Glucose-Capillary 214 (*) 70 - 99 (mg/dL)  PROTIME-INR      Component Value Range   Prothrombin Time 14.0  11.6 - 15.2 (seconds)   INR 1.06  0.00 - 1.49   CBC      Component Value Range   WBC 10.6 (*) 4.0 - 10.5 (K/uL)   RBC 3.45 (*) 4.22 - 5.81 (MIL/uL)   Hemoglobin 10.9 (*) 13.0 - 17.0 (g/dL)   HCT 16.1 (*) 09.6 - 52.0 (%)   MCV 93.3  78.0 -  100.0 (fL)   MCH 31.6  26.0 - 34.0 (pg)   MCHC 33.9  30.0 - 36.0 (g/dL)   RDW 04.5  40.9 - 81.1 (%)   Platelets 167  150 - 400 (K/uL)  BASIC METABOLIC PANEL      Component Value Range   Sodium 136  135 - 145 (mEq/L)   Potassium 4.4  3.5 - 5.1 (mEq/L)   Chloride 102  96 - 112 (mEq/L)   CO2 27  19 - 32 (mEq/L)   Glucose, Bld 203 (*) 70 - 99 (mg/dL)   BUN 15  6 - 23 (mg/dL)   Creatinine, Ser 9.14  0.50 - 1.35 (mg/dL)   Calcium 8.8  8.4 - 78.2 (mg/dL)   GFR calc non Af Amer 76 (*) >90 (mL/min)   GFR calc Af Amer 88 (*) >90 (mL/min)  GLUCOSE, CAPILLARY      Component Value Range   Glucose-Capillary 234 (*) 70 - 99 (mg/dL)   Comment 1 Notify RN      Exam - Neurovascular intact Sensation intact distally Dressing - clean, dry Motor function intact -  moving foot and toes well on exam.  Hemovac pulled without difficulty.  Assessment/Plan: 1 Day Post-Op Procedure(s) (LRB): TOTAL KNEE ARTHROPLASTY (Right)  Past Medical History  Diagnosis Date  . Hypertension   . Hyperlipidemia   . Diabetes mellitus   . Hypothyroidism   . Macular degeneration   . OA (osteoarthritis)   . Anxiety   . Depression   . PVC's (premature ventricular contractions)   . Hx: UTI (urinary tract infection)   . Insomnia   . Stroke     mid 1990s   . Pneumonia   . Chronic kidney disease     hx of uti due to catheter hx of prostatitis, BPH -   . Cancer     basal and squamous skin carcinoma     Discharge home with home health versus SNF depending upon progress.  DVT Prophylaxis - Coumadin Protocol wit Lovenox Bridge Weight-Bearing as tolerated to right leg Keep foley until tomorrow. No vaccines. D/C PCA, Change to IV push D/C O2 and Pulse OX and try on Room [address]  Patrica Duel 05/12/2011, 8:48 AM

## 2011-05-12 NOTE — Progress Notes (Signed)
Physical Therapy Treatment Patient Details Name: Stephen Wilkinson MRN: [id number] DOB: 1928/10/31 Today's Date: 05/12/2011 0950 - 1002  TA PT Assessment/Plan  PT - Assessment/Plan Comments on Treatment Session: Pt appears increasingly confused, attempting to move self from chair to bed stating its because his food gave him reflux which he never has. PT Plan: Discharge plan remains appropriate PT Frequency: 7X/week Follow Up Recommendations: Skilled nursing facility Equipment Recommended: Defer to next venue PT Goals  Acute Rehab PT Goals Pt will go Supine/Side to Sit: with supervision PT Goal: Supine/Side to Sit - Progress: Not met Pt will go Sit to Supine/Side: with supervision PT Goal: Sit to Supine/Side - Progress: Not met Pt will Transfer Sit to Stand/Stand to Sit: with supervision PT Transfer Goal: Sit to Stand/Stand to Sit - Progress: Not met PT Goal: Ambulate - Progress: Not met  PT Treatment Precautions/Restrictions  Precautions Precautions: Knee Required Braces or Orthoses: Yes Knee Immobilizer: Discontinue once straight leg raise with < 10 degree lag Restrictions Weight Bearing Restrictions: No RLE Weight Bearing: Weight bearing as tolerated Other Position/Activity Restrictions: WBAT Mobility (including Balance) Bed Mobility Sit to Supine - Right: 1: +2 Total assist Sit to Supine - Right Details (indicate cue type and reason): cues for sequence and mod assist for management of R LE Transfers Sit to Stand: 1: +2 Total assist;With armrests;From chair/3-in-1;With upper extremity assist Sit to Stand Details (indicate cue type and reason): cues for use of UEs and for LE positioning - pt 50% Stand to Sit: 1: +2 Total assist;To bed;With upper extremity assist Stand to Sit Details: cues for use of UEs and for LE positioning - pt 50% Ambulation/Gait Ambulation/Gait Assistance: 1: +2 Total assist (pt 60%) Ambulation/Gait Assistance Details (indicate cue type and reason): cues for  posture, position from RW, sequence, increased WB on R LE, basic saftey awareness, stride length Ambulation Distance (Feet): 5 Feet Assistive device: Rolling walker Gait Pattern: Step-to pattern    Exercise    End of Session PT - End of Session Activity Tolerance: Patient limited by fatigue;Other (comment) (Increased confusion?  Pt attempting to go back to bed on own) Patient left: in bed;with call bell in reach Nurse Communication: Other (comment) (Pt mental status, bed alarm on)  Stephen Wilkinson 05/12/2011, 2:23 PM

## 2011-05-12 NOTE — Progress Notes (Signed)
ANTICOAGULATION CONSULT NOTE - Follow-up Pharmacy Consult for: Coumadin Indication: VTE prophylaxis s/p R TKA  Labs:  [facility] 05/12/11 0409  HGB 10.9*  HCT 32.2*  PLT 167  APTT --  LABPROT 14.0  INR 1.06  HEPARINUNFRC --  CREATININE 0.96  CKTOTAL --  CKMB --  TROPONINI --   The CrCl is unknown because both a height and weight (above a minimum accepted value) are required for this calculation.    Assessment:  77 yo male s/p R TKA  Baseline PT/INR WNL  Noted urologist requested Cipro post-op,Cipro discontinued. Quinolones can increase PT/INR  Also on Metformin, SCr WNL, no change necessary  Concurrent Plavix,ASA 81mg  enteric-coated daily (hx CVA)  No change in INR with 1st dose of Coumadin, too early for response.   Goal of Therapy:  INR 2-3   Plan:  Lovenox 30mg  SQ q12 until INR >/= 2 Repeat 5mg  Coumadin today Daily protime  Elianne Gubser L 05/12/2011,1:03 PM

## 2011-05-12 NOTE — Progress Notes (Signed)
CSW assisting with D/C planning. Met with pt/spouse this am. Pt is willing to accept ST SNF placement upon D/C. Awaiting response from [facility]. Will follow.

## 2011-05-12 NOTE — Progress Notes (Signed)
Physical Therapy Evaluation Patient Details Name: Stephen Wilkinson MRN: [id number] DOB: 09-Jun-1929 Today's Date: 05/12/2011 0835 - 0918 EVAL, TE Problem List:  Patient Active Problem List  Diagnoses  . Hypothyroidism  . Benign hypertensive heart disease without heart failure  . Fatigue  . Dyslipidemia  . Diabetes mellitus  . Anxiety and depression  . Macular degeneration of both eyes  . Osteoarthritis    Past Medical History:  Past Medical History  Diagnosis Date  . Hypertension   . Hyperlipidemia   . Diabetes mellitus   . Hypothyroidism   . Macular degeneration   . OA (osteoarthritis)   . Anxiety   . Depression   . PVC's (premature ventricular contractions)   . Hx: UTI (urinary tract infection)   . Insomnia   . Stroke     mid 1990s   . Pneumonia   . Chronic kidney disease     hx of uti due to catheter hx of prostatitis, BPH -   . Cancer     basal and squamous skin carcinoma    Past Surgical History:  Past Surgical History  Procedure Date  . Replacement total knee   . Thyroidectomy   . Appendectomy   . Tonsillectomy and adenoidectomy   . Inguinal hernia repair   . US echocardiography 11/10/2006    EF 55-60%  . US echocardiography 09/20/2002    EF 65-70%  . Other surgical history     microwave procedure for prostate - 09/12     PT Assessment/Plan/Recommendation PT Assessment Clinical Impression Statement: PT with R TKR presents wtih decreased R LE strength/ROM, questionable saftey awareness and awareness of dificits; and decreased functional mobility.  Pt will benefit from skilled PT intervention to maximize IND for next venue of care PT Recommendation/Assessment: Patient will need skilled PT in the acute care venue PT Problem List: Decreased strength;Decreased range of motion;Decreased activity tolerance;Decreased mobility;Decreased cognition;Decreased knowledge of use of DME;Decreased knowledge of precautions;Pain PT Plan PT Frequency: 7X/week PT  Treatment/Interventions: DME instruction;Gait training;Stair training;Functional mobility training;Therapeutic exercise;Balance training;Patient/family education PT Recommendation Recommendations for Other Services: OT consult Follow Up Recommendations: Skilled nursing facility Equipment Recommended: Defer to next venue PT Goals  Acute Rehab PT Goals PT Goal Formulation: With patient Time For Goal Achievement: 7 days Pt will go Supine/Side to Sit: with supervision PT Goal: Supine/Side to Sit - Progress: Not met Pt will go Sit to Supine/Side: with supervision PT Goal: Sit to Supine/Side - Progress: Not met Pt will Transfer Sit to Stand/Stand to Sit: with supervision PT Transfer Goal: Sit to Stand/Stand to Sit - Progress: Not met Pt will Ambulate: 51 - 150 feet;with min assist;with rolling walker PT Goal: Ambulate - Progress: Not met  PT Evaluation Precautions/Restrictions  Precautions Precautions: Knee Required Braces or Orthoses: Yes Knee Immobilizer: Discontinue once straight leg raise with < 10 degree lag Restrictions Weight Bearing Restrictions: No Other Position/Activity Restrictions: WBAT Prior Functioning  Home Living Lives With: Spouse Receives Help From: Family Type of Home: House Home Layout: One level Home Access: Stairs to enter Entrance Stairs-Rails: None Entrance Stairs-Number of Steps: 2 Prior Function Level of Independence: Independent with basic ADLs;Independent with gait;Independent with transfers Able to Take Stairs?: Yes Cognition Cognition Arousal/Alertness: Awake/alert Overall Cognitive Status: Appears within functional limits for tasks assessed Orientation Level: Oriented X4 Cognition - Other Comments: Pt rambling on regarding previous SNF stay vs homecare vs wife assisting at home.  Difficulty focusing on task. Sensation/Coordination Coordination Gross Motor Movements are Fluid and Coordinated: Yes Extremity  Assessment RUE Assessment RUE  Assessment: Within Functional Limits LUE Assessment LUE Assessment: Within Functional Limits RLE Assessment RLE Assessment: Exceptions to [facility] RLE PROM (degrees) Overall PROM Right Lower Extremity: Due to pain (-10 - 35) RLE Strength RLE Overall Strength: Due to pain (2/5 quads) LLE Assessment LLE Assessment: Within Functional Limits Mobility (including Balance) Bed Mobility Bed Mobility: Yes Supine to Sit: 1: +2 Total assist;HOB flat;With rails Supine to Sit Details (indicate cue type and reason): pt 50% Transfers Transfers: Yes Sit to Stand: 1: +2 Total assist;From bed;With upper extremity assist Sit to Stand Details (indicate cue type and reason): cues for use of UEs and for LE positioning - pt 50% Stand to Sit: 1: +2 Total assist;With armrests;To chair/3-in-1;With upper extremity assist Stand to Sit Details: cues for use of UEs and for LE positioning - pt 50% Ambulation/Gait Ambulation/Gait: Yes Ambulation/Gait Assistance: 1: +2 Total assist Ambulation/Gait Assistance Details (indicate cue type and reason): cues for posture, position from RW, sequence, increased WB on R LE, basic saftey awareness, stride length Ambulation Distance (Feet): 21 Feet Assistive device: Rolling walker Gait Pattern: Step-to pattern    Exercise  Total Joint Exercises Ankle Circles/Pumps: AROM;Both;10 reps;Supine Quad Sets: AROM;10 reps;Supine;Both Heel Slides: AAROM;Right;10 reps;Supine Straight Leg Raises: AAROM;Right;10 reps;Supine End of Session PT - End of Session Equipment Utilized During Treatment: Gait belt Activity Tolerance: Patient limited by fatigue;Patient limited by pain Patient left: in chair;with call bell in reach (Pt instructed not to attempt out of chair without assist) Nurse Communication: Mobility status for transfers;Mobility status for ambulation General Behavior During Session: Other (comment) (Pt agreeable but mildly impulsive and with difficulty focusi) Cognition:  (see  above)  Malie Kashani 05/12/2011, 1:03 PM

## 2011-05-12 NOTE — Progress Notes (Signed)
Physical Therapy Treatment Patient Details Name: Stephen Wilkinson MRN: [id number] DOB: 06-08-1929 Today's Date: 05/12/2011 1553 - 1628  2GT PT Assessment/Plan  PT - Assessment/Plan Comments on Treatment Session: Pt up in chair and agrees to call for assist with return to bed but will require monitoring  PT Plan: Discharge plan remains appropriate PT Frequency: 7X/week Follow Up Recommendations: Skilled nursing facility Equipment Recommended: Defer to next venue PT Goals  Acute Rehab PT Goals Pt will go Supine/Side to Sit: with supervision PT Goal: Supine/Side to Sit - Progress: Progressing toward goal Pt will go Sit to Supine/Side: with supervision PT Goal: Sit to Supine/Side - Progress: Not met Pt will Transfer Sit to Stand/Stand to Sit: with supervision PT Transfer Goal: Sit to Stand/Stand to Sit - Progress: Progressing toward goal PT Goal: Ambulate - Progress: Progressing toward goal  PT Treatment Precautions/Restrictions  Precautions Precautions: Knee Required Braces or Orthoses: Yes Knee Immobilizer: Discontinue once straight leg raise with < 10 degree lag Restrictions Weight Bearing Restrictions: No RLE Weight Bearing: Weight bearing as tolerated Other Position/Activity Restrictions: WBAT Mobility (including Balance) Bed Mobility Supine to Sit: 1: +2 Total assist;HOB flat Supine to Sit Details (indicate cue type and reason): pt 60% Sit to Supine - Right: 1: +2 Total assist Sit to Supine - Right Details (indicate cue type and reason): cues for sequence and mod assist for management of R LE Transfers Sit to Stand: 1: +2 Total assist;From bed;With upper extremity assist Sit to Stand Details (indicate cue type and reason): pt 60% with cues for LE position and use of UEs Stand to Sit: 1: +2 Total assist Stand to Sit Details: pt 60% with cues for LE position and use of UEs Ambulation/Gait Ambulation/Gait Assistance: 1: +2 Total assist Ambulation/Gait Assistance Details (indicate  cue type and reason): cues for sequence, position from RW, stride length, and increased UE WB (pt 60%) Ambulation Distance (Feet): 50 Feet Assistive device: Rolling walker Gait Pattern: Step-to pattern    Exercise    End of Session PT - End of Session Activity Tolerance: Patient tolerated treatment well;Patient limited by fatigue Patient left: in chair;with call bell in reach Nurse Communication: Other (comment) (Pt in chair - agrees to call for assist to bed - needs monit) General Behavior During Session:  (continues mildly impulsive)  Stephen Wilkinson 05/12/2011, 4:40 PM

## 2011-05-13 ENCOUNTER — Encounter (HOSPITAL_COMMUNITY): Payer: Self-pay | Admitting: Orthopedic Surgery

## 2011-05-13 LAB — CBC
HCT: 25.4 % — ABNORMAL LOW (ref 39.0–52.0)
MCHC: 34.6 g/dL (ref 30.0–36.0)
MCV: 91.4 fL (ref 78.0–100.0)
Platelets: 138 10*3/uL — ABNORMAL LOW (ref 150–400)
RDW: 12.8 % (ref 11.5–15.5)

## 2011-05-13 LAB — BASIC METABOLIC PANEL
BUN: 16 mg/dL (ref 6–23)
CO2: 25 mEq/L (ref 19–32)
Calcium: 8.3 mg/dL — ABNORMAL LOW (ref 8.4–10.5)
Chloride: 100 mEq/L (ref 96–112)
Creatinine, Ser: 0.86 mg/dL (ref 0.50–1.35)
GFR calc Af Amer: >90 mL/min (ref >90)

## 2011-05-13 LAB — GLUCOSE, CAPILLARY
Glucose-Capillary: 181 mg/dL — ABNORMAL HIGH (ref 70–99)
Glucose-Capillary: 157 mg/dL — ABNORMAL HIGH (ref 70–99)

## 2011-05-13 MED ORDER — METOCLOPRAMIDE HCL 10 MG PO TABS: 5.0000 mg | ORAL_TABLET | Freq: Three times a day (TID) | ORAL | Status: DC | PRN

## 2011-05-13 MED ORDER — WARFARIN SODIUM 5 MG PO TABS
5.0000 mg | ORAL_TABLET | Freq: Once | ORAL | Status: AC
  Administered 2011-05-13: 5 mg via ORAL
  Filled 2011-05-13: qty 1

## 2011-05-13 MED ORDER — TRAMADOL HCL 50 MG PO TABS
50.0000 mg | ORAL_TABLET | Freq: Four times a day (QID) | ORAL | Status: DC | PRN
  Administered 2011-05-13: 100 mg via ORAL
  Administered 2011-05-14: 50 mg via ORAL
  Administered 2011-05-15: 100 mg via ORAL
  Filled 2011-05-13: qty 1
  Filled 2011-05-13 (×2): qty 2

## 2011-05-13 MED ORDER — METOCLOPRAMIDE HCL 5 MG/ML IJ SOLN: 5.0000 mg | Freq: Three times a day (TID) | INTRAMUSCULAR | Status: DC | PRN

## 2011-05-13 NOTE — Progress Notes (Signed)
Pt has accepted ST SNF bed at [facility]. Will assist with D/C planning to SNF when stable.

## 2011-05-13 NOTE — Progress Notes (Signed)
ANTICOAGULATION CONSULT NOTE - Follow-up Pharmacy Consult for: Coumadin Indication: VTE prophylaxis s/p R TKA  Labs:  [facility] 05/13/11 1335 05/12/11 0409  HGB 8.8* 10.9*  HCT 25.4* 32.2*  PLT 138* 167  APTT -- --  LABPROT 21.4* 14.0  INR 1.82* 1.06  HEPARINUNFRC -- --  CREATININE 0.86 0.96  CKTOTAL -- --  CKMB -- --  TROPONINI -- --   Estimated Creatinine Clearance: 77.4 ml/min (by C-G formula based on Cr of 0.86).  Assessment:  77 yo male s/p R TKA  Baseline PT/INR WNL  Plavix/Aspirin at home, not currently receiving  INR responding significantly now after 5mg  x 2 doses  Goal of Therapy:  INR 2-3   Plan:  Lovenox 30mg  SQ q12 until INR >/= 2 Decrease coumadin to 2.5mg  today Daily PT/INR  Rollene Fare 05/13/2011,3:44 PM Pager: [phone]

## 2011-05-13 NOTE — Progress Notes (Signed)
Patient per report has been slightly confused all day, however as the night continued the patient's confusion began to worsen. When it was time to give the patient his 10pm medications, he refused them claiming "I'm not taking these, people are out to get me." So the medications were wasted. Around 11pm, the nurse tech came and notified me that the patient was attempting to get out of the chair claiming that he needed to go check on his car to make sure no one was attempting to steal it. After sitting and talking to the patient he went into detail about his personal life. I notified his spouse to let her know what was going on, and was informed that the patient does have periods of confusion at times, but since he has been hospitalized, his confusion had worsened. The wife claimed while she was visiting her husband today, he asked her to leave. After getting off the phone with his wife the patient claimed he his was beginning to hurt. I gathered pain medications for the patient, and also pulled his xanax and Ambien which the patient refused earlier. Upon leaving the room, the patient informed me that he tends to have suicidal thoughts at times, but he could never go through with it. Patient is currently sleeping in the recliner  With the door open. Will continue to monitor patient.

## 2011-05-13 NOTE — Progress Notes (Signed)
OT Cancellation Note  __X_ Treatment cancelled today due to:  Per PT, patient was very fatigued after PT session and was assisted back to bed. Will reattempt OT eval later as schedule permits.   Signature: Judithann Sauger OTR/L [phone] 05/13/2011

## 2011-05-13 NOTE — Progress Notes (Signed)
Subjective: 2 Days Post-Op Procedure(s) (LRB): TOTAL KNEE ARTHROPLASTY (Right) Patient reports pain as moderate.   Patient seen in rounds with Dr. Lequita Halt. Patient has complaints of confusion and paranoia, but is more appropriate during rounds today than last night. Most likely narcotic related so we will discontinue narcotic pain meds. Not tolerating CPM machine well so discontinue for today but will try again tomorrow.   Objective: Vital signs in last 24 hours: Temp:  [98.9 F (37.2 C)-100.2 F (37.9 C)] 100.2 F (37.9 C) (11/28 0515) Pulse Rate:  [77-97] 89  (11/28 1038) Resp:  [18] 18  (11/28 0515) BP: (172-193)/(71-78) 172/78 mmHg (11/28 1038) SpO2:  [95 %-100 %] 97 % (11/28 0515)  Intake/Output from previous day:  Intake/Output Summary (Last 24 hours) at 05/13/11 1215 Last data filed at 05/13/11 0900  Gross per 24 hour  Intake    720 ml  Output   1850 ml  Net  -1130 ml    Intake/Output this shift: Total I/O In: 120 [P.O.:120] Out: -   Labs: Results for orders placed during the hospital encounter of 05/11/11  TYPE AND SCREEN      Component Value Range   ABO/RH(D) O POS     Antibody Screen NEG     Sample Expiration 05/14/2011    GLUCOSE, CAPILLARY      Component Value Range   Glucose-Capillary 132 (*) 70 - 99 (mg/dL)  GLUCOSE, CAPILLARY      Component Value Range   Glucose-Capillary 96  70 - 99 (mg/dL)  GLUCOSE, CAPILLARY      Component Value Range   Glucose-Capillary 141 (*) 70 - 99 (mg/dL)  GLUCOSE, CAPILLARY      Component Value Range   Glucose-Capillary 214 (*) 70 - 99 (mg/dL)  PROTIME-INR      Component Value Range   Prothrombin Time 14.0  11.6 - 15.2 (seconds)   INR 1.06  0.00 - 1.49   CBC      Component Value Range   WBC 10.6 (*) 4.0 - 10.5 (K/uL)   RBC 3.45 (*) 4.22 - 5.81 (MIL/uL)   Hemoglobin 10.9 (*) 13.0 - 17.0 (g/dL)   HCT 98.1 (*) 19.1 - 52.0 (%)   MCV 93.3  78.0 - 100.0 (fL)   MCH 31.6  26.0 - 34.0 (pg)   MCHC 33.9  30.0 - 36.0 (g/dL)     RDW 47.8  29.5 - 62.1 (%)   Platelets 167  150 - 400 (K/uL)  BASIC METABOLIC PANEL      Component Value Range   Sodium 136  135 - 145 (mEq/L)   Potassium 4.4  3.5 - 5.1 (mEq/L)   Chloride 102  96 - 112 (mEq/L)   CO2 27  19 - 32 (mEq/L)   Glucose, Bld 203 (*) 70 - 99 (mg/dL)   BUN 15  6 - 23 (mg/dL)   Creatinine, Ser 3.08  0.50 - 1.35 (mg/dL)   Calcium 8.8  8.4 - 65.7 (mg/dL)   GFR calc non Af Amer 76 (*) >90 (mL/min)   GFR calc Af Amer 88 (*) >90 (mL/min)  GLUCOSE, CAPILLARY      Component Value Range   Glucose-Capillary 234 (*) 70 - 99 (mg/dL)   Comment 1 Notify RN    GLUCOSE, CAPILLARY      Component Value Range   Glucose-Capillary 190 (*) 70 - 99 (mg/dL)  GLUCOSE, CAPILLARY      Component Value Range   Glucose-Capillary 163 (*) 70 - 99 (mg/dL)  GLUCOSE, CAPILLARY      Component Value Range   Glucose-Capillary 184 (*) 70 - 99 (mg/dL)  GLUCOSE, CAPILLARY      Component Value Range   Glucose-Capillary 155 (*) 70 - 99 (mg/dL)  GLUCOSE, CAPILLARY      Component Value Range   Glucose-Capillary 173 (*) 70 - 99 (mg/dL)    Exam - Neurovascular intact Sensation intact distally dressing C/D/I Dressing/Incision - clean, dry, no drainage. Fracture blisters noted at distal tip of the incision medially.  Motor function intact - moving foot and toes well on exam.   Assessment/Plan: 2 Days Post-Op Procedure(s) (LRB): TOTAL KNEE ARTHROPLASTY (Right) Altered mental status- change meds  Past Medical History  Diagnosis Date  . Hypertension   . Hyperlipidemia   . Diabetes mellitus   . Hypothyroidism   . Macular degeneration   . OA (osteoarthritis)   . Anxiety   . Depression   . PVC's (premature ventricular contractions)   . Hx: UTI (urinary tract infection)   . Insomnia   . Stroke     mid 1990s   . Pneumonia   . Chronic kidney disease     hx of uti due to catheter hx of prostatitis, BPH -   . Cancer     basal and squamous skin carcinoma     Up with  therapy Discharge to SNF when met goals  DVT Prophylaxis - Coumadin Protocol with Lovenox bridge Weight-Bearing as tolerated to right leg  Refused labs earlier this morning. Will check labs this afternoon.   PERKINS, ALEXZANDREW 05/13/2011, 12:15 PM

## 2011-05-13 NOTE — Progress Notes (Signed)
Physical Therapy Treatment Patient Details Name: Stephen Wilkinson MRN: [id number] DOB: [birth year] Today's Date: 05/13/2011 1520 - 1551  2GT PT Assessment/Plan  PT - Assessment/Plan Comments on Treatment Session: Pt continues ltd by fatigue and confusion.  Pt continues to express anxiety regarding conflict with spouse PT Plan: Discharge plan remains appropriate PT Frequency: 7X/week Follow Up Recommendations: Skilled nursing facility PT Goals  Acute Rehab PT Goals PT Goal: Supine/Side to Sit - Progress: Progressing toward goal PT Goal: Sit to Supine/Side - Progress: Not met PT Transfer Goal: Sit to Stand/Stand to Sit - Progress: Progressing toward goal PT Goal: Ambulate - Progress: Progressing toward goal  PT Treatment Precautions/Restrictions  Precautions Precautions: Knee Required Braces or Orthoses: Yes Knee Immobilizer: Discontinue once straight leg raise with < 10 degree lag Restrictions Weight Bearing Restrictions: No RLE Weight Bearing: Weight bearing as tolerated Other Position/Activity Restrictions: WBAT Mobility (including Balance) Bed Mobility Supine to Sit: 1: +2 Total assist;HOB flat Supine to Sit Details (indicate cue type and reason): pt 60% with multimodal cues Sit to Supine - Right: 1: +2 Total assist Transfers Sit to Stand: 1: +2 Total assist;From bed;With upper extremity assist Sit to Stand Details (indicate cue type and reason): pt 60% with cues for LE position and use of UEs Stand to Sit: 1: +2 Total assist;To chair/3-in-1;With upper extremity assist;With armrests Stand to Sit Details: pt 50% with cues for use of UEs and LE position Ambulation/Gait Ambulation/Gait Assistance: 1: +2 Total assist Ambulation/Gait Assistance Details (indicate cue type and reason): Constant verbal and tactile cues for sequence, position from RW, sequence, stride length and pace - pt 70% Ambulation Distance (Feet): 44 Feet Assistive device: Rolling walker Gait Pattern: Step-to pattern     End of Session PT - End of Session Equipment Utilized During Treatment: Gait belt Activity Tolerance: Patient limited by fatigue (c/o dizziness  BP 157/82) Patient left: in chair;with call bell in reach Nurse Communication: Other (comment) (Pt up in chair.) General Behavior During Session:  (Con't confused but more easily focussed on task)  Manna Gose 05/13/2011, 4:39 PM

## 2011-05-13 NOTE — Progress Notes (Signed)
Physical Therapy Treatment Patient Details Name: Stephen Wilkinson MRN: [id number] DOB: 03/01/1929 Today's Date: 05/13/2011 0931 - 1009  TA, GT, TE PT Assessment/Plan  PT - Assessment/Plan PT Plan: Discharge plan remains appropriate PT Frequency: 7X/week Follow Up Recommendations: Skilled nursing facility PT Goals  Acute Rehab PT Goals PT Goal: Supine/Side to Sit - Progress: Not met PT Goal: Sit to Supine/Side - Progress: Not met PT Transfer Goal: Sit to Stand/Stand to Sit - Progress: Not met PT Goal: Ambulate - Progress: Not met  PT Treatment Precautions/Restrictions  Precautions Precautions: Knee Required Braces or Orthoses: Yes Knee Immobilizer: Discontinue once straight leg raise with < 10 degree lag Restrictions Weight Bearing Restrictions: No RLE Weight Bearing: Weight bearing as tolerated Other Position/Activity Restrictions: WBAT Mobility (including Balance) Bed Mobility Sit to Supine - Right: 1: +2 Total assist Sit to Supine - Right Details (indicate cue type and reason): Pt 40% Transfers Sit to Stand: 1: +2 Total assist;Without upper extremity assist;With armrests;From chair/3-in-1 Sit to Stand Details (indicate cue type and reason): pt 50% with cues for use of UEs and LE position Stand to Sit: 1: +2 Total assist;With armrests;To bed;To chair/3-in-1;With upper extremity assist Stand to Sit Details: pt 40% cith cues Ambulation/Gait Ambulation/Gait Assistance: 1: +2 Total assist (ltd by fatigue) Ambulation/Gait Assistance Details (indicate cue type and reason): cues for posture, sequence, position from RW Ambulation Distance (Feet):  (7 and3') Assistive device: Rolling walker Gait Pattern: Step-to pattern    Exercise  Total Joint Exercises Ankle Circles/Pumps: AROM;20 reps;Supine;Both Quad Sets: AAROM;15 reps;Supine;Both Heel Slides: AAROM;20 reps;Supine;Right Straight Leg Raises: AAROM;20 reps;Supine;Right End of Session PT - End of Session Equipment Utilized During  Treatment: Gait belt Activity Tolerance: Patient limited by fatigue Patient left: in bed;with call bell in reach Nurse Communication: Mobility status for transfers;Mobility status for ambulation General Behavior During Session:  (Easily confused.  Focussed on conflict with spouse.)  Stephen Wilkinson 05/13/2011, 4:30 PM

## 2011-05-14 ENCOUNTER — Inpatient Hospital Stay (HOSPITAL_COMMUNITY): Payer: Medicare Other

## 2011-05-14 LAB — CBC
Hemoglobin: 9.9 g/dL — ABNORMAL LOW (ref 13.0–17.0)
MCH: 31.5 pg (ref 26.0–34.0)
MCHC: 34.3 g/dL (ref 30.0–36.0)
MCV: 91.9 fL (ref 78.0–100.0)
MCV: 89.8 fL (ref 78.0–100.0)
Platelets: 126 10*3/uL — ABNORMAL LOW (ref 150–400)
Platelets: 158 10*3/uL (ref 150–400)
RBC: 3.23 MIL/uL — ABNORMAL LOW (ref 4.22–5.81)
RDW: 12.9 % (ref 11.5–15.5)
WBC: 10.5 10*3/uL (ref 4.0–10.5)

## 2011-05-14 LAB — PROTIME-INR: Prothrombin Time: 23.8 seconds — ABNORMAL HIGH (ref 11.6–15.2)

## 2011-05-14 LAB — GLUCOSE, CAPILLARY: Glucose-Capillary: 131 mg/dL — ABNORMAL HIGH (ref 70–99)

## 2011-05-14 MED ORDER — WARFARIN SODIUM 3 MG PO TABS
3.0000 mg | ORAL_TABLET | Freq: Once | ORAL | Status: AC
  Administered 2011-05-14: 3 mg via ORAL
  Filled 2011-05-14: qty 1

## 2011-05-14 MED ORDER — SODIUM CHLORIDE 0.9 % IV SOLN
INTRAVENOUS | Status: DC
  Administered 2011-05-14: 19:00:00 via INTRAVENOUS

## 2011-05-14 MED ORDER — ACETAMINOPHEN 325 MG PO TABS
650.0000 mg | ORAL_TABLET | Freq: Once | ORAL | Status: AC
  Administered 2011-05-14: 650 mg via ORAL
  Filled 2011-05-14: qty 2

## 2011-05-14 NOTE — Progress Notes (Addendum)
Patient was sleeping in the chair per his request as he did the previous night.  Patient had used his urinal throughout the night with no problem or assistance.  Patient was found on the floor with his legs straight in front of him holding his upper body up with his arms behind him.  Patient states " I didn't hit my head" and "I just hurt my knee."  He was placed back in the bed with active ROM performed.  His vital signs were stable, comfort measures applied and medications given.  The on-call MD was notified of his blood pustula being of a larger radius and his c/o pain in the right surgical knee.  No x-rays to be taken at this time.  I was unable to contact the patient's wife at either number listed on the face sheet after several attempts (line was busy).    At [phone] tried calling the family again at [phone] with just a busy signal.

## 2011-05-14 NOTE — Progress Notes (Signed)
Subjective: 3 Days Post-Op Procedure(s) (LRB): TOTAL KNEE ARTHROPLASTY (Right) Patient reports pain as mild.   Patient seen in rounds with Dr. Lequita Halt. Patient has complaints of a fall last night.  No pain at this time.  Will check xray of knee.  Plan was to go to [facility] today but he became lightheaded and nearly passed out coming back form bathroom.  Orthostatics were checked and his pulse went up from 80's to 130's.  His hgb is 8.2 down from 10.2 two days ago.  He as complaints of being weak at this time.  His confusion has resolved.  Wife in room and very concerned about this fall and the lack of use of CPM.  We discussed the CPM and the protocol that are used typically postop.  Pt had complaints of pain during use and it was help due to pain.  We will check xray this morning of the knee since he did have a fall.  Due the the symptomatic anemia, felt that he would benefit from blood.  The patient is ok with transfusion and the wife is in agreement with the blood.  If receives blood and xrays are ok, possiblity that he may still be able to go [facility] today but more likely tomorrow.  Objective: Vital signs in last 24 hours: Temp:  [98.6 F (37 C)-100.8 F (38.2 C)] 98.8 F (37.1 C) (11/29 0830) Pulse Rate:  [82-130] 130  (11/29 0947) Resp:  [18-20] 18  (11/29 0830) BP: (120-176)/(56-88) 120/59 mmHg (11/29 0947) SpO2:  [94 %-99 %] 97 % (11/29 0830)  Intake/Output from previous day:  Intake/Output Summary (Last 24 hours) at 05/14/11 1037 Last data filed at 05/14/11 0815  Gross per 24 hour  Intake    591 ml  Output   1500 ml  Net   -909 ml    Intake/Output this shift: Total I/O In: 240 [P.O.:240] Out: -   Labs: Results for orders placed during the hospital encounter of 05/11/11  TYPE AND SCREEN      Component Value Range   ABO/RH(D) O POS     Antibody Screen NEG     Sample Expiration 05/14/2011     Unit Number [id number]     Blood Component Type RED CELLS,LR     Unit division 00      Status of Unit ALLOCATED     Transfusion Status OK TO TRANSFUSE     Crossmatch Result Compatible     Unit Number [id number]     Blood Component Type RED CELLS,LR     Unit division 00     Status of Unit ALLOCATED     Transfusion Status OK TO TRANSFUSE     Crossmatch Result Compatible    GLUCOSE, CAPILLARY      Component Value Range   Glucose-Capillary 132 (*) 70 - 99 (mg/dL)  GLUCOSE, CAPILLARY      Component Value Range   Glucose-Capillary 96  70 - 99 (mg/dL)  GLUCOSE, CAPILLARY      Component Value Range   Glucose-Capillary 141 (*) 70 - 99 (mg/dL)  GLUCOSE, CAPILLARY      Component Value Range   Glucose-Capillary 214 (*) 70 - 99 (mg/dL)  PROTIME-INR      Component Value Range   Prothrombin Time 14.0  11.6 - 15.2 (seconds)   INR 1.06  0.00 - 1.49   CBC      Component Value Range   WBC 10.6 (*) 4.0 - 10.5 (K/uL)   RBC 3.45 (*)  4.22 - 5.81 (MIL/uL)   Hemoglobin 10.9 (*) 13.0 - 17.0 (g/dL)   HCT 62.9 (*) 52.8 - 52.0 (%)   MCV 93.3  78.0 - 100.0 (fL)   MCH 31.6  26.0 - 34.0 (pg)   MCHC 33.9  30.0 - 36.0 (g/dL)   RDW 41.3  24.4 - 01.0 (%)   Platelets 167  150 - 400 (K/uL)  BASIC METABOLIC PANEL      Component Value Range   Sodium 136  135 - 145 (mEq/L)   Potassium 4.4  3.5 - 5.1 (mEq/L)   Chloride 102  96 - 112 (mEq/L)   CO2 27  19 - 32 (mEq/L)   Glucose, Bld 203 (*) 70 - 99 (mg/dL)   BUN 15  6 - 23 (mg/dL)   Creatinine, Ser 2.72  0.50 - 1.35 (mg/dL)   Calcium 8.8  8.4 - 53.6 (mg/dL)   GFR calc non Af Amer 76 (*) >90 (mL/min)   GFR calc Af Amer 88 (*) >90 (mL/min)  GLUCOSE, CAPILLARY      Component Value Range   Glucose-Capillary 234 (*) 70 - 99 (mg/dL)   Comment 1 Notify RN    GLUCOSE, CAPILLARY      Component Value Range   Glucose-Capillary 190 (*) 70 - 99 (mg/dL)  GLUCOSE, CAPILLARY      Component Value Range   Glucose-Capillary 163 (*) 70 - 99 (mg/dL)  GLUCOSE, CAPILLARY      Component Value Range   Glucose-Capillary 184 (*) 70 - 99 (mg/dL)  GLUCOSE,  CAPILLARY      Component Value Range   Glucose-Capillary 155 (*) 70 - 99 (mg/dL)  GLUCOSE, CAPILLARY      Component Value Range   Glucose-Capillary 173 (*) 70 - 99 (mg/dL)  GLUCOSE, CAPILLARY      Component Value Range   Glucose-Capillary 181 (*) 70 - 99 (mg/dL)   Comment 1 Notify RN    BASIC METABOLIC PANEL      Component Value Range   Sodium 135  135 - 145 (mEq/L)   Potassium 3.8  3.5 - 5.1 (mEq/L)   Chloride 100  96 - 112 (mEq/L)   CO2 25  19 - 32 (mEq/L)   Glucose, Bld 183 (*) 70 - 99 (mg/dL)   BUN 16  6 - 23 (mg/dL)   Creatinine, Ser 6.44  0.50 - 1.35 (mg/dL)   Calcium 8.3 (*) 8.4 - 10.5 (mg/dL)   GFR calc non Af Amer 80 (*) >90 (mL/min)   GFR calc Af Amer >90  >90 (mL/min)  CBC      Component Value Range   WBC 12.5 (*) 4.0 - 10.5 (K/uL)   RBC 2.78 (*) 4.22 - 5.81 (MIL/uL)   Hemoglobin 8.8 (*) 13.0 - 17.0 (g/dL)   HCT 03.4 (*) 74.2 - 52.0 (%)   MCV 91.4  78.0 - 100.0 (fL)   MCH 31.7  26.0 - 34.0 (pg)   MCHC 34.6  30.0 - 36.0 (g/dL)   RDW 59.5  63.8 - 75.6 (%)   Platelets 138 (*) 150 - 400 (K/uL)  PROTIME-INR      Component Value Range   Prothrombin Time 21.4 (*) 11.6 - 15.2 (seconds)   INR 1.82 (*) 0.00 - 1.49   PROTIME-INR      Component Value Range   Prothrombin Time 23.8 (*) 11.6 - 15.2 (seconds)   INR 2.09 (*) 0.00 - 1.49   CBC      Component Value Range   WBC  10.0  4.0 - 10.5 (K/uL)   RBC 2.60 (*) 4.22 - 5.81 (MIL/uL)   Hemoglobin 8.2 (*) 13.0 - 17.0 (g/dL)   HCT 40.9 (*) 81.1 - 52.0 (%)   MCV 91.9  78.0 - 100.0 (fL)   MCH 31.5  26.0 - 34.0 (pg)   MCHC 34.3  30.0 - 36.0 (g/dL)   RDW 91.4  78.2 - 95.6 (%)   Platelets 126 (*) 150 - 400 (K/uL)  GLUCOSE, CAPILLARY      Component Value Range   Glucose-Capillary 157 (*) 70 - 99 (mg/dL)  GLUCOSE, CAPILLARY      Component Value Range   Glucose-Capillary 142 (*) 70 - 99 (mg/dL)   Comment 1 Notify RN    GLUCOSE, CAPILLARY      Component Value Range   Glucose-Capillary 145 (*) 70 - 99 (mg/dL)  PREPARE RBC  (CROSSMATCH)      Component Value Range   Order Confirmation ORDER PROCESSED BY BLOOD BANK      Exam - Neurovascular intact Sensation intact distally dressing C/D/I Dressing/Incision - clean, dry, Blisters noted. Motor function intact - moving foot and toes well on exam.   Assessment/Plan: 3 Days Post-Op Procedure(s) (LRB): TOTAL KNEE ARTHROPLASTY (Right)  Past Medical History  Diagnosis Date  . Hypertension   . Hyperlipidemia   . Diabetes mellitus   . Hypothyroidism   . Macular degeneration   . OA (osteoarthritis)   . Anxiety   . Depression   . PVC's (premature ventricular contractions)   . Hx: UTI (urinary tract infection)   . Insomnia   . Stroke     mid 1990s   . Pneumonia   . Chronic kidney disease     hx of uti due to catheter hx of prostatitis, BPH -   . Cancer     basal and squamous skin carcinoma     Discharge to SNF if X-rays ok and receives blood and has resolved issues.  DVT Prophylaxis - Coumadin Protocol Weight-Bearing as tolerated to left leg  Stephen Wilkinson 05/14/2011, 10:37 AM

## 2011-05-14 NOTE — Progress Notes (Signed)
ANTICOAGULATION CONSULT NOTE - Follow-up Pharmacy Consult for: Coumadin Indication: VTE prophylaxis s/p R TKA  Labs:  [facility] 05/14/11 0426 05/13/11 1335 05/12/11 0409  HGB 8.2* 8.8* --  HCT 23.9* 25.4* 32.2*  PLT 126* 138* 167  APTT -- -- --  LABPROT 23.8* 21.4* 14.0  INR 2.09* 1.82* 1.06  HEPARINUNFRC -- -- --  CREATININE -- 0.86 0.96  CKTOTAL -- -- --  CKMB -- -- --  TROPONINI -- -- --   Estimated Creatinine Clearance: 77.4 ml/min (by C-G formula based on Cr of 0.86).  Coagulation: Lovenox 30mg  Q12 11/26-11-/29 Coumadin 5mg  on 11/26, 11/27, and 11/28  Assessment:  77 yo male s/p R TKA  Plavix/Aspirin at home, not currently receiving  INR therapeutic after 3 doses  Goal of Therapy:  INR 2-3   Plan:  Coumadin 3mg  PO today Discontinue lovenox for INR >1.8 Daily PT/INR, CBC  Lynann Beaver PharmD  Pager [phone] 05/14/2011 1:21 PM

## 2011-05-14 NOTE — Progress Notes (Signed)
Called to room by OT, [facility]. Pt had amb to BR w/ RW and assist and became dizzy, pale, lightheaded, and stated he felt weak. VSS. See flowsheet. Pt felt better after sitting down with cool cloth to forehead and deep breaths. Able to ambulate to bed w/ OT and RW. Drew, PA, made aware of above, as well as VS. No new orders at present.

## 2011-05-14 NOTE — Progress Notes (Signed)
Physical Therapy Treatment Patient Details Name: Stephen Wilkinson MRN: [id number] DOB: March 08, 1929 Today's Date: 05/14/2011  11:30-11:40 TE  PT Assessment/Plan  PT - Assessment/Plan Comments on Treatment Session: Pt fell last night. Xray of knee done, results pending. Pt became dizzy while up with OT this morning. Hgb 8.2, awaiting transfusion. Exercises only, avoided knee flexion, until xray results completed.  PT Plan: Discharge plan remains appropriate PT Frequency: 7X/week Follow Up Recommendations: Skilled nursing facility Equipment Recommended: Defer to next venue PT Goals     PT Treatment Precautions/Restrictions  Precautions Precautions: Knee Required Braces or Orthoses: Yes Knee Immobilizer: Discontinue once straight leg raise with < 10 degree lag Restrictions Weight Bearing Restrictions: No RLE Weight Bearing: Weight bearing as tolerated Other Position/Activity Restrictions: WBAT Mobility (including Balance) Bed Mobility Bed Mobility: No (deferred since pt was dizzy this AM, and xray results pendin) Supine to Sit: 3: Mod assist;HOB flat Supine to Sit Details (indicate cue type and reason): cues to get R leg off bed. Sit to Supine - Right: 3: Mod assist;HOB flat Transfers Transfers: No (Deferred) Sit to Stand: 3: Mod assist;From elevated surface;With armrests;From bed Sit to Stand Details (indicate cue type and reason): pt required assist to start standing.  Cues for safety. Stand to Sit: 4: Min assist;With armrests;To bed;To chair/3-in-1 Stand to Sit Details: cues to reach back and put leg out when sitting to avoid pain. Ambulation/Gait Ambulation/Gait: No (deferred, xray results pending, pt awaiting blood, is anemic)    Exercise  Total Joint Exercises Ankle Circles/Pumps: 15 reps;Supine;AROM;Both Quad Sets: AROM;Right;5 reps;Supine Towel Squeeze: AROM;Both;5 reps Heel Slides: Other (comment) (deferred, xray results pending, pt fell last night) Straight Leg Raises:  Right;AAROM;10 reps;Supine End of Session PT - End of Session Activity Tolerance: Patient limited by fatigue Patient left: in bed;with call bell in reach General Behavior During Session: [facility] for tasks performed Cognition: [facility] for tasks performed  Tamala Ser 05/14/2011, 12:03 PM [phone]

## 2011-05-14 NOTE — Progress Notes (Signed)
Pt wife at bedside. Inquiring about meeting w/ rep from [facility] at [place], SW, confirmed mtg is on for 10AM. Wife requests to speak w/ Kenard Gower, [place]. Drew aware and will be in to see pt and wife. Pt and wife aware of both.

## 2011-05-14 NOTE — Progress Notes (Signed)
Occupational Therapy Evaluation Patient Details Name: Stephen Wilkinson MRN: [id number] DOB: 04-17-1929 Today's Date: 05/14/2011 EV2  [id number] Problem List:  Patient Active Problem List  Diagnoses  . Hypothyroidism  . Benign hypertensive heart disease without heart failure  . Fatigue  . Dyslipidemia  . Diabetes mellitus  . Anxiety and depression  . Macular degeneration of both eyes  . Osteoarthritis  . Osteoarthritis of right knee    Past Medical History:  Past Medical History  Diagnosis Date  . Hypertension   . Hyperlipidemia   . Diabetes mellitus   . Hypothyroidism   . Macular degeneration   . OA (osteoarthritis)   . Anxiety   . Depression   . PVC's (premature ventricular contractions)   . Hx: UTI (urinary tract infection)   . Insomnia   . Stroke     mid 1990s   . Pneumonia   . Chronic kidney disease     hx of uti due to catheter hx of prostatitis, BPH -   . Cancer     basal and squamous skin carcinoma    Past Surgical History:  Past Surgical History  Procedure Date  . Replacement total knee   . Thyroidectomy   . Appendectomy   . Tonsillectomy and adenoidectomy   . Inguinal hernia repair   . US echocardiography 11/10/2006    EF 55-60%  . US echocardiography 09/20/2002    EF 65-70%  . Other surgical history     microwave procedure for prostate - 09/12   . Total knee arthroplasty 05/11/2011    Procedure: TOTAL KNEE ARTHROPLASTY;  Surgeon: Loanne Drilling;  Location: [facility];  Service: Orthopedics;  Laterality: Right;    OT Assessment/Plan/Recommendation OT Assessment Clinical Impression Statement: Pt s/p R TKR w fall last night with deficits in areas of mobility and adls affecting  independence with ADLS.  Pt would benefit from futher OT to increase independence and safety with adls before returning home. OT Recommendation/Assessment: Patient will need skilled OT in the acute care venue OT Problem List: Decreased strength;Decreased activity tolerance;Impaired  balance (sitting and/or standing);Decreased safety awareness;Decreased knowledge of use of DME or AE;Pain OT Therapy Diagnosis : Generalized weakness;Acute pain OT Plan OT Frequency: Min 2X/week OT Treatment/Interventions: Self-care/ADL training;DME and/or AE instruction;Therapeutic activities OT Recommendation Follow Up Recommendations: Skilled nursing facility Equipment Recommended: Defer to next venue Individuals Consulted Consulted and Agree with Results and Recommendations: Family member/caregiver Family Member Consulted: wife OT Goals Acute Rehab OT Goals OT Goal Formulation: With patient/family Time For Goal Achievement: 7 days ADL Goals Pt Will Perform Grooming: with supervision;Standing at sink ADL Goal: Grooming - Progress: Progressing toward goals Pt Will Perform Lower Body Bathing: with supervision;Sitting at sink;Standing at sink ADL Goal: Lower Body Bathing - Progress: Progressing toward goals Pt Will Perform Lower Body Dressing: with supervision;Sit to stand from chair ADL Goal: Lower Body Dressing - Progress: Progressing toward goals Pt Will Perform Tub/Shower Transfer: with min assist;Shower seat with back ADL Goal: Tub/Shower Transfer - Progress: Progressing toward goals Additional ADL Goal #1: Pt will complete all toileting tasks with S for safety. ADL Goal: Additional Goal #1 - Progress: Progressing toward goals  OT Evaluation Precautions/Restrictions  Precautions Precautions: Knee Required Braces or Orthoses: Yes Knee Immobilizer: Discontinue once straight leg raise with < 10 degree lag Restrictions Weight Bearing Restrictions: No RLE Weight Bearing: Weight bearing as tolerated Other Position/Activity Restrictions: WBAT Prior Functioning Home Living Lives With: Spouse Receives Help From: Family Type of Home: House Home Layout: One  level Home Access: Stairs to enter Entrance Stairs-Rails: None Entrance Stairs-Number of Steps: 2 Bathroom Shower/Tub:  Tub/shower unit;Curtain;Walk-in shower (has both....his is tub shower but can use wife's walk in ) Bathroom Toilet: Standard Bathroom Accessibility: Yes How Accessible: Accessible via walker Home Adaptive Equipment: Bedside commode/3-in-1;Walker - rolling;Shower chair with back Prior Function Level of Independence: Independent with basic ADLs;Independent with gait;Independent with transfers Able to Take Stairs?: Yes Driving: Yes Vocation: Retired Gaffer: worked for [employer] Leisure: Hobbies-yes (Comment) Comments: dancing, gardening ADL ADL Eating/Feeding: Simulated;Independent Where Assessed - Eating/Feeding: Edge of bed Grooming: Performed;Minimal assistance Grooming Details (indicate cue type and reason): assist to maintain balance/safety when standing at sink Where Assessed - Grooming: Standing at sink Upper Body Bathing: Simulated;Supervision/safety Upper Body Bathing Details (indicate cue type and reason): S for safety.  Pt is fall risk Where Assessed - Upper Body Bathing: Sitting, bed Lower Body Bathing: Moderate assistance;Simulated Lower Body Bathing Details (indicate cue type and reason): assist to reach lower legs and to stand Where Assessed - Lower Body Bathing: Sit to stand from bed Upper Body Dressing: Simulated;Supervision/safety Where Assessed - Upper Body Dressing: Sitting, bed Lower Body Dressing: Moderate assistance;Performed Lower Body Dressing Details (indicate cue type and reason): Supervision secondary to safety and mod assist to reach lower body. Where Assessed - Lower Body Dressing: Sit to stand from chair Toilet Transfer: Performed;Minimal assistance Toilet Transfer Details (indicate cue type and reason): min assist and cues to reach back to sit.  Pt with safety issues requiring VCs Toilet Transfer Method: Ambulating Toilet Transfer Equipment: Comfort height toilet;Grab bars Toileting - Clothing Manipulation: Performed;Moderate  assistance Toileting - Clothing Manipulation Details (indicate cue type and reason): pt needs supervision to manage clothes secondary to fall precautions Where Assessed - Toileting Clothing Manipulation: Sit to stand from 3-in-1 or toilet Toileting - Hygiene: Simulated;Supervision/safety Toileting - Hygiene Details (indicate cue type and reason): pt sat and did with supervision Where Assessed - Toileting Hygiene: Sit on 3-in-1 or toilet Tub/Shower Transfer: Not assessed Tub/Shower Transfer Method: Not assessed Equipment Used: Rolling walker ADL Comments: Pt doing well with adls at this point.  Pt needs supervison at all times due to safety issues. Vision/Perception    Cognition Cognition Arousal/Alertness: Awake/alert Overall Cognitive Status: History of cognitive impairments History of Cognitive Impairment: Appears at baseline functioning Orientation Level: Oriented to person;Oriented to place;Oriented to time Cognition - Other Comments: Pt appears to have mild word finding deficits and gets confused easily.  Pt did experience a fall last night secondary to confusion.  Pt with history of mild demenita. Sensation/Coordination Coordination Gross Motor Movements are Fluid and Coordinated: Yes Fine Motor Movements are Fluid and Coordinated: No Finger Nose Finger Test: Pt shakes during test. Extremity Assessment RUE Assessment RUE Assessment: Within Functional Limits LUE Assessment LUE Assessment: Within Functional Limits Mobility  Bed Mobility Bed Mobility: Yes Supine to Sit: 3: Mod assist;HOB flat Supine to Sit Details (indicate cue type and reason): cues to get R leg off bed. Sit to Supine - Right: 3: Mod assist;HOB flat Transfers Transfers: Yes Sit to Stand: 3: Mod assist;From elevated surface;With armrests;From bed Sit to Stand Details (indicate cue type and reason): pt required assist to start standing.  Cues for safety. Stand to Sit: 4: Min assist;With armrests;To bed;To  chair/3-in-1 Stand to Sit Details: cues to reach back and put leg out when sitting to avoid pain. Exercises   End of Session OT - End of Session Equipment Utilized During Treatment: Right knee immobilizer Activity Tolerance:  Patient tolerated treatment well;Other (comment) (was dizzy when standing.  Dizziness subsided with sitting) Patient left: in bed;with call bell in reach;with bed alarm set;with family/visitor present Nurse Communication: Mobility status for transfers;Mobility status for ambulation General Behavior During Session: Other (comment) Cognition: Impaired, at baseline   Hope Budds  [phone] 05/14/2011, 9:54 AM

## 2011-05-15 ENCOUNTER — Encounter (HOSPITAL_COMMUNITY): Payer: Self-pay | Admitting: Orthopedic Surgery

## 2011-05-15 DIAGNOSIS — E876 Hypokalemia: Secondary | ICD-10-CM

## 2011-05-15 DIAGNOSIS — D62 Acute posthemorrhagic anemia: Secondary | ICD-10-CM

## 2011-05-15 DIAGNOSIS — R4182 Altered mental status, unspecified: Secondary | ICD-10-CM

## 2011-05-15 HISTORY — DX: Altered mental status, unspecified: R41.82

## 2011-05-15 HISTORY — DX: Hypokalemia: E87.6

## 2011-05-15 HISTORY — DX: Acute posthemorrhagic anemia: D62

## 2011-05-15 LAB — BASIC METABOLIC PANEL
CO2: 29 mEq/L (ref 19–32)
Chloride: 103 mEq/L (ref 96–112)
GFR calc non Af Amer: 79 mL/min — ABNORMAL LOW (ref >90)
Glucose, Bld: 125 mg/dL — ABNORMAL HIGH (ref 70–99)
Potassium: 3.4 mEq/L — ABNORMAL LOW (ref 3.5–5.1)
Sodium: 137 mEq/L (ref 135–145)

## 2011-05-15 LAB — TYPE AND SCREEN
ABO/RH(D): O POS
Antibody Screen: NEGATIVE
Unit division: 0

## 2011-05-15 LAB — GLUCOSE, CAPILLARY: Glucose-Capillary: 113 mg/dL — ABNORMAL HIGH (ref 70–99)

## 2011-05-15 LAB — CBC
HCT: 26.5 % — ABNORMAL LOW (ref 39.0–52.0)
Hemoglobin: 9.2 g/dL — ABNORMAL LOW (ref 13.0–17.0)
MCH: 31 pg (ref 26.0–34.0)
MCV: 89.2 fL (ref 78.0–100.0)
RBC: 2.97 MIL/uL — ABNORMAL LOW (ref 4.22–5.81)

## 2011-05-15 MED ORDER — DSS 100 MG PO CAPS: 100.0000 mg | ORAL_CAPSULE | Freq: Two times a day (BID) | ORAL | Status: AC

## 2011-05-15 MED ORDER — ONDANSETRON HCL 4 MG PO TABS: 4.0000 mg | ORAL_TABLET | Freq: Four times a day (QID) | ORAL | Status: AC | PRN

## 2011-05-15 MED ORDER — TRAMADOL HCL 50 MG PO TABS: 50.0000 mg | ORAL_TABLET | Freq: Four times a day (QID) | ORAL | Status: AC | PRN

## 2011-05-15 MED ORDER — POLYETHYLENE GLYCOL 3350 17 G PO PACK: 17.0000 g | PACK | Freq: Every day | ORAL | Status: AC | PRN

## 2011-05-15 MED ORDER — METHOCARBAMOL 500 MG PO TABS: 500.0000 mg | ORAL_TABLET | Freq: Four times a day (QID) | ORAL | Status: AC | PRN

## 2011-05-15 MED ORDER — ACETAMINOPHEN 325 MG PO TABS: 650.0000 mg | ORAL_TABLET | Freq: Four times a day (QID) | ORAL | Status: AC | PRN

## 2011-05-15 MED ORDER — WARFARIN SODIUM 3 MG PO TABS: 3.0000 mg | ORAL_TABLET | Freq: Once | ORAL | Status: DC

## 2011-05-15 MED ORDER — WARFARIN SODIUM 3 MG PO TABS
3.0000 mg | ORAL_TABLET | Freq: Once | ORAL | Status: DC
  Filled 2011-05-15: qty 1

## 2011-05-15 MED ORDER — POTASSIUM CHLORIDE CRYS ER 20 MEQ PO TBCR
40.0000 meq | EXTENDED_RELEASE_TABLET | Freq: Once | ORAL | Status: AC
  Administered 2011-05-15: 40 meq via ORAL
  Filled 2011-05-15: qty 2

## 2011-05-15 MED ORDER — BISACODYL 5 MG PO TBEC: 10.0000 mg | DELAYED_RELEASE_TABLET | Freq: Every day | ORAL | Status: AC | PRN

## 2011-05-15 NOTE — Progress Notes (Signed)
Pt for transfer to [facility] by PTAR. Pt and wife present and agreeable. Pt dressed and transferred to rehab via ambulance in stable condition. Wife and PTAR in possession of pt personal belongings and transfer paperwork.

## 2011-05-15 NOTE — Progress Notes (Signed)
Physical Therapy Treatment Patient Details Name: Stephen Wilkinson MRN: [id number] DOB: 16-Nov-1928 Today's Date: 05/15/2011 8:50-9:00 TE  PT Assessment/Plan  PT - Assessment/Plan Comments on Treatment Session: Pt alert and pleasant, stated he was told to not get up today by MD, I spoke with Stephen Peace, PA who stated this is NOT correct. Will continue to encourage mobility. PT Plan: Discharge plan remains appropriate PT Frequency: 7X/week Follow Up Recommendations: Skilled nursing facility Equipment Recommended: Defer to next venue PT Goals  Acute Rehab PT Goals Time For Goal Achievement: 7 days Pt will go Supine/Side to Sit: with supervision PT Goal: Supine/Side to Sit - Progress: Not met Pt will go Sit to Supine/Side: with supervision PT Goal: Sit to Supine/Side - Progress: Not met Pt will Transfer Sit to Stand/Stand to Sit: with supervision PT Transfer Goal: Sit to Stand/Stand to Sit - Progress: Not met Pt will Ambulate: 51 - 150 feet;with min assist;with rolling walker PT Goal: Ambulate - Progress: Not met  PT Treatment Precautions/Restrictions  Precautions Precautions: Knee;Fall Required Braces or Orthoses: Yes Knee Immobilizer: Discontinue once straight leg raise with < 10 degree lag Restrictions Weight Bearing Restrictions: No RLE Weight Bearing: Weight bearing as tolerated Other Position/Activity Restrictions: WBAT Mobility (including Balance) Bed Mobility Bed Mobility: Yes (min A to scoot up in bed with trapeze) Transfers Transfers: No (pt refused, pt stated MD told him not to get up today) Ambulation/Gait Ambulation/Gait: No (pt refused)    Exercise  Total Joint Exercises Ankle Circles/Pumps: 15 reps;Supine;AROM;Both Short Arc QuadBarbaraann Wilkinson;Right;10 reps;Supine Heel Slides: 10 reps;Supine;AAROM Straight Leg Raises: Right;AAROM;10 reps;Supine End of Session PT - End of Session Activity Tolerance: Patient limited by pain Patient left: in bed;with call bell in  reach General Behavior During Session: [facility] for tasks performed Cognition: Impaired (some confusion about events of last few days)  Stephen Wilkinson 05/15/2011, 9:07 AM Stephen Wilkinson PT 05/15/2011  [phone]

## 2011-05-15 NOTE — Discharge Summary (Signed)
Physician Discharge Summary   Patient ID: Stephen Wilkinson MRN: [id number] DOB/AGE: 1929-04-17 77 y.o.  Admit date: 05/11/2011 Discharge date: 05/15/2011  Primary Diagnosis: Osteoarthrosis Right Knee  Admission Diagnoses: Past Medical History  Diagnosis Date  . Hypertension   . Hyperlipidemia   . Diabetes mellitus   . Hypothyroidism   . Macular degeneration   . OA (osteoarthritis)   . Anxiety   . Depression   . PVC's (premature ventricular contractions)   . Hx: UTI (urinary tract infection)   . Insomnia   . Stroke     mid 1990s   . Pneumonia   . Chronic kidney disease     hx of uti due to catheter hx of prostatitis, BPH -   . Cancer     basal and squamous skin carcinoma   . Acute blood loss anemia 05/15/2011    Discharge Diagnoses:  Principal Problem:  *Osteoarthritis of right knee Active Problems:  Altered mental status  Acute blood loss anemia  Hypokalemia   Procedure: Procedure(s) (LRB): TOTAL KNEE ARTHROPLASTY (Right)   Consults: none  HPI:  Stephen Wilkinson is a 77 y.o. year old male with end stage OA of his rightt knee with progressively worsening pain and dysfunction. He has constant pain, with activity and at rest and significant functional deficits with difficulties even with ADLs. He has had extensive non-op management including analgesics, injections of cortisone and viscosupplements, and home exercise program, but remains in significant pain with significant dysfunction. Radiographs show bone on bone arthritis of the medial and patellofemoral compartments with joint subluxation. He presents now for right Total Knee Arthroplasty.  Laboratory Data: Results for orders placed during the hospital encounter of 05/11/11  TYPE AND SCREEN      Component Value Range   ABO/RH(D) O POS     Antibody Screen NEG     Sample Expiration 05/14/2011     Unit Number [id number]     Blood Component Type RED CELLS,LR     Unit division 00     Status of Unit ISSUED,FINAL     Transfusion Status OK TO TRANSFUSE     Crossmatch Result Compatible     Unit Number [id number]     Blood Component Type RED CELLS,LR     Unit division 00     Status of Unit ISSUED,FINAL     Transfusion Status OK TO TRANSFUSE     Crossmatch Result Compatible    GLUCOSE, CAPILLARY      Component Value Range   Glucose-Capillary 132 (*) 70 - 99 (mg/dL)  GLUCOSE, CAPILLARY      Component Value Range   Glucose-Capillary 96  70 - 99 (mg/dL)  GLUCOSE, CAPILLARY      Component Value Range   Glucose-Capillary 141 (*) 70 - 99 (mg/dL)  GLUCOSE, CAPILLARY      Component Value Range   Glucose-Capillary 214 (*) 70 - 99 (mg/dL)  PROTIME-INR      Component Value Range   Prothrombin Time 14.0  11.6 - 15.2 (seconds)   INR 1.06  0.00 - 1.49   CBC      Component Value Range   WBC 10.6 (*) 4.0 - 10.5 (K/uL)   RBC 3.45 (*) 4.22 - 5.81 (MIL/uL)   Hemoglobin 10.9 (*) 13.0 - 17.0 (g/dL)   HCT 44.0 (*) 34.7 - 52.0 (%)   MCV 93.3  78.0 - 100.0 (fL)   MCH 31.6  26.0 - 34.0 (pg)   MCHC 33.9  30.0 - 36.0 (g/dL)  RDW 12.9  11.5 - 15.5 (%)   Platelets 167  150 - 400 (K/uL)  BASIC METABOLIC PANEL      Component Value Range   Sodium 136  135 - 145 (mEq/L)   Potassium 4.4  3.5 - 5.1 (mEq/L)   Chloride 102  96 - 112 (mEq/L)   CO2 27  19 - 32 (mEq/L)   Glucose, Bld 203 (*) 70 - 99 (mg/dL)   BUN 15  6 - 23 (mg/dL)   Creatinine, Ser 4.09  0.50 - 1.35 (mg/dL)   Calcium 8.8  8.4 - 81.1 (mg/dL)   GFR calc non Af Amer 76 (*) >90 (mL/min)   GFR calc Af Amer 88 (*) >90 (mL/min)  GLUCOSE, CAPILLARY      Component Value Range   Glucose-Capillary 234 (*) 70 - 99 (mg/dL)   Comment 1 Notify RN    GLUCOSE, CAPILLARY      Component Value Range   Glucose-Capillary 190 (*) 70 - 99 (mg/dL)  GLUCOSE, CAPILLARY      Component Value Range   Glucose-Capillary 163 (*) 70 - 99 (mg/dL)  GLUCOSE, CAPILLARY      Component Value Range   Glucose-Capillary 184 (*) 70 - 99 (mg/dL)  GLUCOSE, CAPILLARY      Component Value  Range   Glucose-Capillary 155 (*) 70 - 99 (mg/dL)  GLUCOSE, CAPILLARY      Component Value Range   Glucose-Capillary 173 (*) 70 - 99 (mg/dL)  GLUCOSE, CAPILLARY      Component Value Range   Glucose-Capillary 181 (*) 70 - 99 (mg/dL)   Comment 1 Notify RN    BASIC METABOLIC PANEL      Component Value Range   Sodium 135  135 - 145 (mEq/L)   Potassium 3.8  3.5 - 5.1 (mEq/L)   Chloride 100  96 - 112 (mEq/L)   CO2 25  19 - 32 (mEq/L)   Glucose, Bld 183 (*) 70 - 99 (mg/dL)   BUN 16  6 - 23 (mg/dL)   Creatinine, Ser 9.14  0.50 - 1.35 (mg/dL)   Calcium 8.3 (*) 8.4 - 10.5 (mg/dL)   GFR calc non Af Amer 80 (*) >90 (mL/min)   GFR calc Af Amer >90  >90 (mL/min)  CBC      Component Value Range   WBC 12.5 (*) 4.0 - 10.5 (K/uL)   RBC 2.78 (*) 4.22 - 5.81 (MIL/uL)   Hemoglobin 8.8 (*) 13.0 - 17.0 (g/dL)   HCT 78.2 (*) 95.6 - 52.0 (%)   MCV 91.4  78.0 - 100.0 (fL)   MCH 31.7  26.0 - 34.0 (pg)   MCHC 34.6  30.0 - 36.0 (g/dL)   RDW 21.3  08.6 - 57.8 (%)   Platelets 138 (*) 150 - 400 (K/uL)  PROTIME-INR      Component Value Range   Prothrombin Time 21.4 (*) 11.6 - 15.2 (seconds)   INR 1.82 (*) 0.00 - 1.49   PROTIME-INR      Component Value Range   Prothrombin Time 23.8 (*) 11.6 - 15.2 (seconds)   INR 2.09 (*) 0.00 - 1.49   CBC      Component Value Range   WBC 10.0  4.0 - 10.5 (K/uL)   RBC 2.60 (*) 4.22 - 5.81 (MIL/uL)   Hemoglobin 8.2 (*) 13.0 - 17.0 (g/dL)   HCT 46.9 (*) 62.9 - 52.0 (%)   MCV 91.9  78.0 - 100.0 (fL)   MCH 31.5  26.0 - 34.0 (pg)  MCHC 34.3  30.0 - 36.0 (g/dL)   RDW 40.9  81.1 - 91.4 (%)   Platelets 126 (*) 150 - 400 (K/uL)  GLUCOSE, CAPILLARY      Component Value Range   Glucose-Capillary 157 (*) 70 - 99 (mg/dL)  GLUCOSE, CAPILLARY      Component Value Range   Glucose-Capillary 142 (*) 70 - 99 (mg/dL)   Comment 1 Notify RN    GLUCOSE, CAPILLARY      Component Value Range   Glucose-Capillary 145 (*) 70 - 99 (mg/dL)  PREPARE RBC (CROSSMATCH)      Component  Value Range   Order Confirmation ORDER PROCESSED BY BLOOD BANK    CBC      Component Value Range   WBC 8.0  4.0 - 10.5 (K/uL)   RBC 2.97 (*) 4.22 - 5.81 (MIL/uL)   Hemoglobin 9.2 (*) 13.0 - 17.0 (g/dL)   HCT 78.2 (*) 95.6 - 52.0 (%)   MCV 89.2  78.0 - 100.0 (fL)   MCH 31.0  26.0 - 34.0 (pg)   MCHC 34.7  30.0 - 36.0 (g/dL)   RDW 21.3  08.6 - 57.8 (%)   Platelets 157  150 - 400 (K/uL)  GLUCOSE, CAPILLARY      Component Value Range   Glucose-Capillary 131 (*) 70 - 99 (mg/dL)  PROTIME-INR      Component Value Range   Prothrombin Time 24.5 (*) 11.6 - 15.2 (seconds)   INR 2.16 (*) 0.00 - 1.49   BASIC METABOLIC PANEL      Component Value Range   Sodium 137  135 - 145 (mEq/L)   Potassium 3.4 (*) 3.5 - 5.1 (mEq/L)   Chloride 103  96 - 112 (mEq/L)   CO2 29  19 - 32 (mEq/L)   Glucose, Bld 125 (*) 70 - 99 (mg/dL)   BUN 21  6 - 23 (mg/dL)   Creatinine, Ser 4.69  0.50 - 1.35 (mg/dL)   Calcium 8.2 (*) 8.4 - 10.5 (mg/dL)   GFR calc non Af Amer 79 (*) >90 (mL/min)   GFR calc Af Amer >90  >90 (mL/min)  GLUCOSE, CAPILLARY      Component Value Range   Glucose-Capillary 127 (*) 70 - 99 (mg/dL)  CBC      Component Value Range   WBC 10.5  4.0 - 10.5 (K/uL)   RBC 3.23 (*) 4.22 - 5.81 (MIL/uL)   Hemoglobin 9.9 (*) 13.0 - 17.0 (g/dL)   HCT 62.9 (*) 52.8 - 52.0 (%)   MCV 89.8  78.0 - 100.0 (fL)   MCH 30.7  26.0 - 34.0 (pg)   MCHC 34.1  30.0 - 36.0 (g/dL)   RDW 41.3  24.4 - 01.0 (%)   Platelets 158  150 - 400 (K/uL)  GLUCOSE, CAPILLARY      Component Value Range   Glucose-Capillary 139 (*) 70 - 99 (mg/dL)  GLUCOSE, CAPILLARY      Component Value Range   Glucose-Capillary 107 (*) 70 - 99 (mg/dL)    Hospital Outpatient Visit on 05/01/2011  Component Date Value Range Status  . aPTT (seconds) 05/01/2011 29  24-37 Final  . WBC (K/uL) 05/01/2011 6.2  4.0-10.5 Final  . RBC (MIL/uL) 05/01/2011 4.49  4.22-5.81 Final  . Hemoglobin (g/dL) 27/25/3664 40.3  47.4-25.9 Final  . HCT (%) 05/01/2011  41.1  39.0-52.0 Final  . MCV (fL) 05/01/2011 91.5  78.0-100.0 Final  . MCH (pg) 05/01/2011 31.6  26.0-34.0 Final  . MCHC (g/dL) 56/38/7564 34.5  30.0-36.0 Final  . RDW (%) 05/01/2011 12.8  11.5-15.5 Final  . Platelets (K/uL) 05/01/2011 230  150-400 Final  . Sodium (mEq/L) 05/01/2011 138  135-145 Final  . Potassium (mEq/L) 05/01/2011 4.0  3.5-5.1 Final  . Chloride (mEq/L) 05/01/2011 102  96-112 Final  . CO2 (mEq/L) 05/01/2011 27  19-32 Final  . Glucose, Bld (mg/dL) 16/03/9603 540* 98-11 Final  . BUN (mg/dL) 91/47/8295 16  6-21 Final  . Creatinine, Ser (mg/dL) 30/86/5784 6.96  2.95-2.84 Final  . Calcium (mg/dL) 13/24/4010 9.8  2.7-25.3 Final  . Total Protein (g/dL) 66/44/0347 7.2  4.2-5.9 Final  . Albumin (g/dL) 56/38/7564 4.2  3.3-2.9 Final  . AST (U/L) 05/01/2011 19  0-37 Final  . ALT (U/L) 05/01/2011 16  0-53 Final  . Alkaline Phosphatase (U/L) 05/01/2011 77  39-117 Final  . Total Bilirubin (mg/dL) 51/88/4166 0.4  0.6-3.0 Final  . GFR calc non Af Amer (mL/min) 05/01/2011 77* >90 Final  . GFR calc Af Amer (mL/min) 05/01/2011 89* >90 Final   Comment:                                 The eGFR has been calculated                          using the CKD EPI equation.                          This calculation has not been                          validated in all clinical                          situations.                          eGFR's persistently                          <90 mL/min signify                          possible Chronic Kidney Disease.  Marland Kitchen Prothrombin Time (seconds) 05/01/2011 13.3  11.6-15.2 Final  . INR  05/01/2011 0.99  0.00-1.49 Final  . Color, Urine  05/01/2011 YELLOW  YELLOW Final  . APPearance  05/01/2011 CLEAR  CLEAR Final  . Specific Gravity, Urine  05/01/2011 1.022  1.005-1.030 Final  . pH  05/01/2011 5.5  5.0-8.0 Final  . Glucose, UA (mg/dL) 16/06/930 NEGATIVE  NEGATIVE Final  . Hgb urine dipstick  05/01/2011 NEGATIVE  NEGATIVE Final  . Bilirubin Urine   05/01/2011 NEGATIVE  NEGATIVE Final  . Ketones, ur (mg/dL) 35/57/3220 NEGATIVE  NEGATIVE Final  . Protein, ur (mg/dL) 25/42/7062 NEGATIVE  NEGATIVE Final  . Urobilinogen, UA (mg/dL) 37/62/8315 0.2  1.7-6.1 Final  . Nitrite  05/01/2011 NEGATIVE  NEGATIVE Final  . Leukocytes, UA  05/01/2011 NEGATIVE  NEGATIVE Final   MICROSCOPIC NOT DONE ON URINES WITH NEGATIVE PROTEIN, BLOOD, LEUKOCYTES, NITRITE, OR GLUCOSE <1000 mg/dL.  Marland Kitchen MRSA, PCR  05/01/2011 NEGATIVE  NEGATIVE Final  . Staphylococcus aureus  05/01/2011 NEGATIVE  NEGATIVE Final   Comment:  The Xpert SA Assay (FDA                          approved for NASAL specimens                          only), is one component of                          a comprehensive surveillance                          program.  It is not intended                          to diagnose infection nor to                          guide or monitor treatment.    X-Rays:Dg Chest 2 View  05/01/2011  *RADIOLOGY REPORT*  Clinical Data: Preoperative examination (Right total knee replacement)  CHEST - 2 VIEW  Comparison: 10/21/2009  Findings: Unchanged cardiac silhouette and mediastinal contours with mild tortuosity of the thoracic aorta.  No focal parenchymal opacity.  No pleural effusion or pneumothorax.  No acute osseous abnormalities.  IMPRESSION: No acute cardiopulmonary disease.  Original Report Authenticated By: Waynard Reeds, M.D.   Dg Knee 1-2 Views Right  05/14/2011  *RADIOLOGY REPORT*  Clinical Data: Fall.  Right total knee arthroplasty.  RIGHT KNEE - 1-2 VIEW  Comparison: None.  Findings: Right three-part total knee arthroplasty is present with expected soft tissue postoperative changes.  Density is present in the suprapatellar subcutaneous fat which may represent a small hematoma.  Atherosclerotic calcification is present in the small vessels.  There is no periprosthetic fracture.  The alignment of the knee is within normal limits.   IMPRESSION: Three-part total knee arthroplasty.  Soft tissue swelling and gas in the soft tissues.  The gas in the soft tissues are greater than expected for arthroplasty.  Clinically correlate for laceration. No hardware complication is identified.  Original Report Authenticated By: Andreas Newport, M.D.    EKG: Orders placed during the hospital encounter of 05/11/11  . EKG 12-LEAD  . EKG 12-LEAD     Hospital Course: Patient was admitted to [facility] and taken to the OR and underwent the above state procedure without complications.  Patient tolerated the procedure well and was later transferred to the recovery room and then to the orthopaedic floor for postoperative care.  They were given PO and IV analgesics for pain control following their surgery.  They were given 24 hours of postoperative antibiotics and started on DVT prophylaxis.   PT and OT were ordered for total joint protocol.  Discharge planning consulted to help with postop disposition and equipment needs.  Patient had a very rough night on the evening of surgery due to pain and started to get up with therapy on day one. He wanted to look into [facility] postop so we got Child psychotherapist involved.  PCA was discontinued and they were weaned over to PO meds.  Hemovac drain was pulled without difficulty.  On day two, he was found to be confused and paranoid.  Most likely multifactorial so we stopped all narcotics.  We also held the CPM at that time due  to moderate pain described by the patient.  Slow progress with therapy into day two.  Dressing was changed on day two and the incision was doing OK.  He was noted to have blisters at the bottom of the incision and were covered with tegaderm coverings..  By day three, the patient was still only progressing slowly with therapy and not meeting goals. He also slipped early that morning going into day three.  Xrays were checked and no changes to the knee prosthesis.  Xrays appeared normal postop.    Incision was healing well and still had blisters.  Patient was seen in rounds and found to bee weak and tired.  He also had a vagal episode when getting up to the bathroom.  Due to his symptoms, it was felt that he would benefit from blood.  We held his transfer and transfused two units of blood.  He was seen and day four and doing better.  His HGB was back up and it was decided to transfer him over to [place] today.    Discharge Medications: Prior to Admission medications   Medication Sig Start Date End Date Taking? Authorizing Provider  ALPRAZolam (XANAX) 0.25 MG tablet Take 0.25 mg by mouth at bedtime.  04/27/11  Yes Cassell Clement, MD  amLODipine (NORVASC) 5 MG tablet Take 5 mg by mouth every morning.  04/27/11  Yes Cassell Clement, MD  Bevacizumab (AVASTIN IV) Place into the right eye as directed. Every 5 weeks   Yes Historical Provider, MD  digoxin (LANOXIN) 0.25 MG tablet Take 0.25-0.5 mg by mouth every morning. 1 tablet Monday, Tuesday, Thursday, Friday, and Saturday.. 2 tablet on Sunday and wednesday   Yes Historical Provider, MD  famotidine (PEPCID) 40 MG tablet Take 40 mg by mouth daily.    Yes Historical Provider, MD  latanoprost (XALATAN) 0.005 % ophthalmic solution Place 1 drop into both eyes at bedtime.    Yes Historical Provider, MD  levothyroxine (SYNTHROID, LEVOTHROID) 100 MCG tablet Take 100-150 mcg by mouth every morning. 1 tablet on Sunday, Tuesday, Thursday, and Saturday.  1.5 tablet on Monday, Wednesday, and Friday.   Yes Historical Provider, MD  liothyronine (CYTOMEL) 25 MCG tablet Take 25 mcg by mouth every morning. 1 tablet 6 days a week. Pt does not take on sunday   Yes Historical Provider, MD  metFORMIN (GLUCOPHAGE) 1000 MG tablet Take 500-1,000 mg by mouth 2 (two) times daily with a meal. 1000 mg in the morning and 500 mg in the pm   Yes Historical Provider, MD  metoprolol succinate (TOPROL-XL) 25 MG 24 hr tablet Take 25 mg by mouth every morning.  04/27/11  Yes  Cassell Clement, MD  acetaminophen (TYLENOL) 325 MG tablet Take 2 tablets (650 mg total) by mouth every 6 (six) hours as needed (or Fever >/= 101). 05/15/11 05/25/11  Alexzandrew Perkins, PA  bisacodyl (DULCOLAX) 5 MG EC tablet Take 2 tablets (10 mg total) by mouth daily as needed for constipation. 05/15/11 06/14/11  Alexzandrew Perkins, PA  docusate sodium 100 MG CAPS Take 100 mg by mouth 2 (two) times daily. 05/15/11 05/25/11  Alexzandrew Perkins, PA  methocarbamol (ROBAXIN) 500 MG tablet Take 1 tablet (500 mg total) by mouth every 6 (six) hours as needed. 05/15/11 05/25/11  Alexzandrew Perkins, PA  ondansetron (ZOFRAN) 4 MG tablet Take 1 tablet (4 mg total) by mouth every 6 (six) hours as needed for nausea. 05/15/11 05/22/11  Alexzandrew Perkins, PA  polyethylene glycol (MIRALAX / GLYCOLAX) packet Take 17 g  by mouth daily as needed. 05/15/11 05/18/11  Alexzandrew Perkins, PA  traMADol (ULTRAM) 50 MG tablet Take 1-2 tablets (50-100 mg total) by mouth every 6 (six) hours as needed. Maximum dose= 8 tablets per day 05/15/11 05/25/11  Alexzandrew Julien Girt, PA  warfarin (COUMADIN) 3 MG tablet Take 1 tablet (3 mg total) by mouth one time only at 6 PM. 05/15/11 05/14/12  Alexzandrew Perkins, PA    Diet: heart healthy  Activity:WBAT   Follow-up:in 2 weeks  Disposition: [facility]  Discharged Condition: stable   Discharge Orders    Future Appointments: Provider: Department: Dept Phone: Center:   08/21/2011 8:40 AM Lorenda Cahill [facility] [phone] [facility]   08/24/2011 9:15 AM Cassell Clement, MD [facility] [phone] None     Future Orders Please Complete By Expires   Diet - low sodium heart healthy      Call MD / Call 911      Comments:   If you experience chest pain or shortness of breath, CALL 911 and be transported to the hospital emergency room.  If you develope a fever above 101 F, pus (white drainage) or increased drainage or redness at the wound, or calf  pain, call your surgeon's office.   Constipation Prevention      Comments:   Drink plenty of fluids.  Prune juice may be helpful.  You may use a stool softener, such as Colace (over the counter) 100 mg twice a day.  Use MiraLax (over the counter) for constipation as needed.   Increase activity slowly as tolerated      Weight Bearing as taught in Physical Therapy      Comments:   Use a walker or crutches as instructed.   Discharge instructions      Comments:   Pick up stool softner and laxative for home. Do not submerge incision under water. May shower. Please leave the tegaderm covering on the blisters for two days, then change every third day until blisters are dry.   Driving restrictions      Comments:   No driving   Lifting restrictions      Comments:   No lifting     Current Discharge Medication List    START taking these medications   Details  acetaminophen (TYLENOL) 325 MG tablet Take 2 tablets (650 mg total) by mouth every 6 (six) hours as needed (or Fever >/= 101). Qty: 30 tablet, Refills: 0    bisacodyl (DULCOLAX) 5 MG EC tablet Take 2 tablets (10 mg total) by mouth daily as needed for constipation. Qty: 30 tablet, Refills: 0    docusate sodium 100 MG CAPS Take 100 mg by mouth 2 (two) times daily. Qty: 60 capsule, Refills: 0    methocarbamol (ROBAXIN) 500 MG tablet Take 1 tablet (500 mg total) by mouth every 6 (six) hours as needed. Qty: 80 tablet, Refills: 0    ondansetron (ZOFRAN) 4 MG tablet Take 1 tablet (4 mg total) by mouth every 6 (six) hours as needed for nausea. Qty: 20 tablet, Refills: 0    polyethylene glycol (MIRALAX / GLYCOLAX) packet Take 17 g by mouth daily as needed. Qty: 14 each, Refills: 0    traMADol (ULTRAM) 50 MG tablet Take 1-2 tablets (50-100 mg total) by mouth every 6 (six) hours as needed. Maximum dose= 8 tablets per day Qty: 80 tablet, Refills: 0    warfarin (COUMADIN) 3 MG tablet Take 1 tablet (3 mg total) by mouth one time only at 6  PM. Rob Bunting: 35 tablet, Refills: 0      CONTINUE these medications which have NOT CHANGED   Details  ALPRAZolam (XANAX) 0.25 MG tablet Take 0.25 mg by mouth at bedtime.     amLODipine (NORVASC) 5 MG tablet Take 5 mg by mouth every morning.     Bevacizumab (AVASTIN IV) Place into the right eye as directed. Every 5 weeks    digoxin (LANOXIN) 0.25 MG tablet Take 0.25-0.5 mg by mouth every morning. 1 tablet Monday, Tuesday, Thursday, Friday, and Saturday.. 2 tablet on Sunday and wednesday    famotidine (PEPCID) 40 MG tablet Take 40 mg by mouth daily.     latanoprost (XALATAN) 0.005 % ophthalmic solution Place 1 drop into both eyes at bedtime.     levothyroxine (SYNTHROID, LEVOTHROID) 100 MCG tablet Take 100-150 mcg by mouth every morning. 1 tablet on Sunday, Tuesday, Thursday, and Saturday.  1.5 tablet on Monday, Wednesday, and Friday.    liothyronine (CYTOMEL) 25 MCG tablet Take 25 mcg by mouth every morning. 1 tablet 6 days a week. Pt does not take on sunday    metFORMIN (GLUCOPHAGE) 1000 MG tablet Take 500-1,000 mg by mouth 2 (two) times daily with a meal. 1000 mg in the morning and 500 mg in the pm    metoprolol succinate (TOPROL-XL) 25 MG 24 hr tablet Take 25 mg by mouth every morning.       STOP taking these medications     aspirin 81 MG EC tablet      clopidogrel (PLAVIX) 75 MG tablet      zolpidem (AMBIEN) 5 MG tablet      OVER THE COUNTER MEDICATION      sildenafil (VIAGRA) 100 MG tablet      thiamine (VITAMIN B-1) 100 MG tablet        Follow-up Information    Follow up with ALUISIO,FRANK V. Make an appointment in 2 weeks. (Please have [facility] make appt and transportation for patient followup.)    Contact information:   [facility] [address] [place] [address] [phone]          Signed: Patrica Duel 05/15/2011, 9:37 AM

## 2011-05-15 NOTE — Progress Notes (Signed)
ANTICOAGULATION CONSULT NOTE - Follow-up Pharmacy Consult for: Coumadin Indication: VTE prophylaxis s/p R TKA  Labs:  Basename 05/15/11 0451 05/14/11 2007 05/14/11 0426 05/13/11 1335  HGB 9.2* 9.9* -- --  HCT 26.5* 29.0* 23.9* --  PLT 157 158 126* --  APTT -- -- -- --  LABPROT 24.5* -- 23.8* 21.4*  INR 2.16* -- 2.09* 1.82*  HEPARINUNFRC -- -- -- --  CREATININE 0.87 -- -- 0.86  CKTOTAL -- -- -- --  CKMB -- -- -- --  TROPONINI -- -- -- --   Estimated Creatinine Clearance: 76.5 ml/min (by C-G formula based on Cr of 0.87).  Coagulation: Lovenox 30mg  Q12 11/26-11-/29 Coumadin 5mg  on 11/26, 11/27, 11/28.  3mg  on 11/29  Assessment:  77 yo male s/p R TKA  Plavix/Aspirin at home, not currently receiving  INR therapeutic  PRBC transfusion yesterday for low H/H  Coumadin education completed  Goal of Therapy:  INR 2-3   Plan:  Coumadin 3mg  PO today Daily PT/INR, CBC  Lynann Beaver PharmD  Pager [phone] 05/15/2011 8:47 AM

## 2011-05-15 NOTE — Progress Notes (Signed)
Pt to transfer to [facility] today via [facility]. Pt, pt's spouse, and SNF aware of d/c. No other CSW needs reported. CSW signing off. Dellie Burns, MSW, LCSWA [phone] (ortho cover)

## 2011-05-17 ENCOUNTER — Other Ambulatory Visit: Payer: Self-pay | Admitting: Orthopedic Surgery

## 2011-06-06 DIAGNOSIS — H547 Unspecified visual loss: Secondary | ICD-10-CM | POA: Diagnosis not present

## 2011-06-06 DIAGNOSIS — E119 Type 2 diabetes mellitus without complications: Secondary | ICD-10-CM | POA: Diagnosis not present

## 2011-06-06 DIAGNOSIS — F329 Major depressive disorder, single episode, unspecified: Secondary | ICD-10-CM | POA: Diagnosis not present

## 2011-06-06 DIAGNOSIS — Z471 Aftercare following joint replacement surgery: Secondary | ICD-10-CM | POA: Diagnosis not present

## 2011-06-06 DIAGNOSIS — Z96659 Presence of unspecified artificial knee joint: Secondary | ICD-10-CM | POA: Diagnosis not present

## 2011-06-07 DIAGNOSIS — Z96659 Presence of unspecified artificial knee joint: Secondary | ICD-10-CM | POA: Diagnosis not present

## 2011-06-07 DIAGNOSIS — F329 Major depressive disorder, single episode, unspecified: Secondary | ICD-10-CM | POA: Diagnosis not present

## 2011-06-07 DIAGNOSIS — H547 Unspecified visual loss: Secondary | ICD-10-CM | POA: Diagnosis not present

## 2011-06-07 DIAGNOSIS — E119 Type 2 diabetes mellitus without complications: Secondary | ICD-10-CM | POA: Diagnosis not present

## 2011-06-07 DIAGNOSIS — Z471 Aftercare following joint replacement surgery: Secondary | ICD-10-CM | POA: Diagnosis not present

## 2011-06-11 DIAGNOSIS — E119 Type 2 diabetes mellitus without complications: Secondary | ICD-10-CM | POA: Diagnosis not present

## 2011-06-11 DIAGNOSIS — Z96659 Presence of unspecified artificial knee joint: Secondary | ICD-10-CM | POA: Diagnosis not present

## 2011-06-11 DIAGNOSIS — Z471 Aftercare following joint replacement surgery: Secondary | ICD-10-CM | POA: Diagnosis not present

## 2011-06-11 DIAGNOSIS — F329 Major depressive disorder, single episode, unspecified: Secondary | ICD-10-CM | POA: Diagnosis not present

## 2011-06-11 DIAGNOSIS — H547 Unspecified visual loss: Secondary | ICD-10-CM | POA: Diagnosis not present

## 2011-06-15 DIAGNOSIS — Z96659 Presence of unspecified artificial knee joint: Secondary | ICD-10-CM | POA: Diagnosis not present

## 2011-06-15 DIAGNOSIS — Z471 Aftercare following joint replacement surgery: Secondary | ICD-10-CM | POA: Diagnosis not present

## 2011-06-15 DIAGNOSIS — E119 Type 2 diabetes mellitus without complications: Secondary | ICD-10-CM | POA: Diagnosis not present

## 2011-06-15 DIAGNOSIS — H547 Unspecified visual loss: Secondary | ICD-10-CM | POA: Diagnosis not present

## 2011-06-15 DIAGNOSIS — F329 Major depressive disorder, single episode, unspecified: Secondary | ICD-10-CM | POA: Diagnosis not present

## 2011-06-17 DIAGNOSIS — Z96659 Presence of unspecified artificial knee joint: Secondary | ICD-10-CM | POA: Diagnosis not present

## 2011-06-17 DIAGNOSIS — H547 Unspecified visual loss: Secondary | ICD-10-CM | POA: Diagnosis not present

## 2011-06-17 DIAGNOSIS — H259 Unspecified age-related cataract: Secondary | ICD-10-CM | POA: Diagnosis not present

## 2011-06-17 DIAGNOSIS — H353290 Exudative age-related macular degeneration, unspecified eye, stage unspecified: Secondary | ICD-10-CM | POA: Diagnosis not present

## 2011-06-17 DIAGNOSIS — Z471 Aftercare following joint replacement surgery: Secondary | ICD-10-CM | POA: Diagnosis not present

## 2011-06-17 DIAGNOSIS — F329 Major depressive disorder, single episode, unspecified: Secondary | ICD-10-CM | POA: Diagnosis not present

## 2011-06-17 DIAGNOSIS — E119 Type 2 diabetes mellitus without complications: Secondary | ICD-10-CM | POA: Diagnosis not present

## 2011-06-18 DIAGNOSIS — E119 Type 2 diabetes mellitus without complications: Secondary | ICD-10-CM | POA: Diagnosis not present

## 2011-06-18 DIAGNOSIS — Z471 Aftercare following joint replacement surgery: Secondary | ICD-10-CM | POA: Diagnosis not present

## 2011-06-18 DIAGNOSIS — H547 Unspecified visual loss: Secondary | ICD-10-CM | POA: Diagnosis not present

## 2011-06-18 DIAGNOSIS — F329 Major depressive disorder, single episode, unspecified: Secondary | ICD-10-CM | POA: Diagnosis not present

## 2011-06-18 DIAGNOSIS — Z96659 Presence of unspecified artificial knee joint: Secondary | ICD-10-CM | POA: Diagnosis not present

## 2011-06-19 DIAGNOSIS — E119 Type 2 diabetes mellitus without complications: Secondary | ICD-10-CM | POA: Diagnosis not present

## 2011-06-19 DIAGNOSIS — H547 Unspecified visual loss: Secondary | ICD-10-CM | POA: Diagnosis not present

## 2011-06-19 DIAGNOSIS — Z96659 Presence of unspecified artificial knee joint: Secondary | ICD-10-CM | POA: Diagnosis not present

## 2011-06-19 DIAGNOSIS — F329 Major depressive disorder, single episode, unspecified: Secondary | ICD-10-CM | POA: Diagnosis not present

## 2011-06-19 DIAGNOSIS — Z471 Aftercare following joint replacement surgery: Secondary | ICD-10-CM | POA: Diagnosis not present

## 2011-06-22 DIAGNOSIS — E119 Type 2 diabetes mellitus without complications: Secondary | ICD-10-CM | POA: Diagnosis not present

## 2011-06-22 DIAGNOSIS — Z96659 Presence of unspecified artificial knee joint: Secondary | ICD-10-CM | POA: Diagnosis not present

## 2011-06-22 DIAGNOSIS — F329 Major depressive disorder, single episode, unspecified: Secondary | ICD-10-CM | POA: Diagnosis not present

## 2011-06-22 DIAGNOSIS — H547 Unspecified visual loss: Secondary | ICD-10-CM | POA: Diagnosis not present

## 2011-06-22 DIAGNOSIS — Z471 Aftercare following joint replacement surgery: Secondary | ICD-10-CM | POA: Diagnosis not present

## 2011-06-23 DIAGNOSIS — M1710 Unilateral primary osteoarthritis, unspecified knee: Secondary | ICD-10-CM | POA: Diagnosis not present

## 2011-06-26 DIAGNOSIS — M1710 Unilateral primary osteoarthritis, unspecified knee: Secondary | ICD-10-CM | POA: Diagnosis not present

## 2011-06-29 DIAGNOSIS — M1710 Unilateral primary osteoarthritis, unspecified knee: Secondary | ICD-10-CM | POA: Diagnosis not present

## 2011-07-01 DIAGNOSIS — M1710 Unilateral primary osteoarthritis, unspecified knee: Secondary | ICD-10-CM | POA: Diagnosis not present

## 2011-07-07 DIAGNOSIS — M1710 Unilateral primary osteoarthritis, unspecified knee: Secondary | ICD-10-CM | POA: Diagnosis not present

## 2011-07-09 DIAGNOSIS — M1710 Unilateral primary osteoarthritis, unspecified knee: Secondary | ICD-10-CM | POA: Diagnosis not present

## 2011-07-13 DIAGNOSIS — M1710 Unilateral primary osteoarthritis, unspecified knee: Secondary | ICD-10-CM | POA: Diagnosis not present

## 2011-07-14 DIAGNOSIS — H35059 Retinal neovascularization, unspecified, unspecified eye: Secondary | ICD-10-CM | POA: Diagnosis not present

## 2011-07-14 DIAGNOSIS — H353290 Exudative age-related macular degeneration, unspecified eye, stage unspecified: Secondary | ICD-10-CM | POA: Diagnosis not present

## 2011-07-14 DIAGNOSIS — H353190 Nonexudative age-related macular degeneration, unspecified eye, stage unspecified: Secondary | ICD-10-CM | POA: Diagnosis not present

## 2011-07-14 DIAGNOSIS — H43829 Vitreomacular adhesion, unspecified eye: Secondary | ICD-10-CM | POA: Diagnosis not present

## 2011-07-16 DIAGNOSIS — M1710 Unilateral primary osteoarthritis, unspecified knee: Secondary | ICD-10-CM | POA: Diagnosis not present

## 2011-07-20 DIAGNOSIS — M1710 Unilateral primary osteoarthritis, unspecified knee: Secondary | ICD-10-CM | POA: Diagnosis not present

## 2011-07-23 DIAGNOSIS — M1710 Unilateral primary osteoarthritis, unspecified knee: Secondary | ICD-10-CM | POA: Diagnosis not present

## 2011-08-17 ENCOUNTER — Other Ambulatory Visit: Payer: Self-pay | Admitting: *Deleted

## 2011-08-17 DIAGNOSIS — K219 Gastro-esophageal reflux disease without esophagitis: Secondary | ICD-10-CM

## 2011-08-17 DIAGNOSIS — R002 Palpitations: Secondary | ICD-10-CM

## 2011-08-17 DIAGNOSIS — E039 Hypothyroidism, unspecified: Secondary | ICD-10-CM

## 2011-08-17 DIAGNOSIS — F419 Anxiety disorder, unspecified: Secondary | ICD-10-CM

## 2011-08-17 MED ORDER — FAMOTIDINE 40 MG PO TABS: 40.0000 mg | ORAL_TABLET | Freq: Every day | ORAL | Status: DC

## 2011-08-17 MED ORDER — DIGOXIN 250 MCG PO TABS: 0.2500 mg | ORAL_TABLET | ORAL | Status: DC

## 2011-08-17 MED ORDER — LEVOTHYROXINE SODIUM 100 MCG PO TABS: 100.0000 ug | ORAL_TABLET | ORAL | Status: DC

## 2011-08-17 MED ORDER — ALPRAZOLAM 0.25 MG PO TABS: 0.2500 mg | ORAL_TABLET | Freq: Every day | ORAL | Status: DC | PRN

## 2011-08-21 ENCOUNTER — Other Ambulatory Visit (INDEPENDENT_AMBULATORY_CARE_PROVIDER_SITE_OTHER): Payer: Medicare Other

## 2011-08-21 DIAGNOSIS — E119 Type 2 diabetes mellitus without complications: Secondary | ICD-10-CM | POA: Diagnosis not present

## 2011-08-21 DIAGNOSIS — I119 Hypertensive heart disease without heart failure: Secondary | ICD-10-CM | POA: Diagnosis not present

## 2011-08-21 DIAGNOSIS — E039 Hypothyroidism, unspecified: Secondary | ICD-10-CM

## 2011-08-21 LAB — BASIC METABOLIC PANEL
BUN: 19 mg/dL (ref 6–23)
GFR: 76.21 mL/min (ref >60.00)
Glucose, Bld: 117 mg/dL — ABNORMAL HIGH (ref 70–99)
Potassium: 4.2 mEq/L (ref 3.5–5.1)

## 2011-08-21 LAB — HEPATIC FUNCTION PANEL
ALT: 13 U/L (ref 0–53)
AST: 19 U/L (ref 0–37)
Bilirubin, Direct: 0.1 mg/dL (ref 0.0–0.3)
Total Bilirubin: 0.4 mg/dL (ref 0.3–1.2)

## 2011-08-23 NOTE — Progress Notes (Signed)
Quick Note:  Please make copy of labs for patient visit. ______ 

## 2011-08-24 ENCOUNTER — Ambulatory Visit (INDEPENDENT_AMBULATORY_CARE_PROVIDER_SITE_OTHER): Payer: Medicare Other | Admitting: Cardiology

## 2011-08-24 ENCOUNTER — Encounter: Payer: Self-pay | Admitting: Cardiology

## 2011-08-24 VITALS — BP 118/70 | HR 76 | Ht 73.0 in | Wt 165.0 lb

## 2011-08-24 DIAGNOSIS — E039 Hypothyroidism, unspecified: Secondary | ICD-10-CM

## 2011-08-24 DIAGNOSIS — E119 Type 2 diabetes mellitus without complications: Secondary | ICD-10-CM

## 2011-08-24 DIAGNOSIS — R5381 Other malaise: Secondary | ICD-10-CM

## 2011-08-24 DIAGNOSIS — I119 Hypertensive heart disease without heart failure: Secondary | ICD-10-CM | POA: Diagnosis not present

## 2011-08-24 DIAGNOSIS — E785 Hyperlipidemia, unspecified: Secondary | ICD-10-CM | POA: Diagnosis not present

## 2011-08-24 MED ORDER — METOPROLOL SUCCINATE ER 25 MG PO TB24: 25.0000 mg | ORAL_TABLET | ORAL | Status: DC

## 2011-08-24 MED ORDER — AMLODIPINE BESYLATE 5 MG PO TABS: 5.0000 mg | ORAL_TABLET | ORAL | Status: DC

## 2011-08-24 MED ORDER — SILDENAFIL CITRATE 100 MG PO TABS: 100.0000 mg | ORAL_TABLET | Freq: Every day | ORAL | Status: DC | PRN

## 2011-08-24 NOTE — Assessment & Plan Note (Signed)
The patient has not been experiencing any headaches or dizzy spells.  He denies any exertional chest pain or palpitations.  No symptoms of CHF.

## 2011-08-24 NOTE — Assessment & Plan Note (Signed)
The patient denies any hypoglycemic episodes 

## 2011-08-24 NOTE — Assessment & Plan Note (Signed)
The patient has a history of dyslipidemia.  He is watching his diet carefully.  He is not on any statin drugs.

## 2011-08-24 NOTE — Patient Instructions (Signed)
Your physician wants you to follow-up in: 4 months You will receive a reminder letter in the mail two months in advance. If you Trygve't receive a letter, please call our office to schedule the follow-up appointment.     .Your physician recommends that you continue on your current medications as directed. Please refer to the Current Medication list given to you today.  

## 2011-08-24 NOTE — Progress Notes (Signed)
Stephen Wilkinson Date of Birth:  April 14, 1929 [facility] [address] [place], [place]  [address] [phone]         Fax   [phone]  History of Present Illness: This pleasant 78 year old gentleman is seen for a four-month followup office visit.  He has a past history of high blood pressure and diabetes.  He also has BPH and osteoarthritis.  He's had hypothyroidism and osteoarthritis.  Since we last saw him he has had his right knee replaced and has been doing well.  He has also had recent prostate surgery for BPH and has noted an improvement in his voiding pattern starting about 6 weeks after the operation just as predicted.  Current Outpatient Prescriptions  Medication Sig Dispense Refill  . ALPRAZolam (XANAX) 0.25 MG tablet Take 1 tablet (0.25 mg total) by mouth daily as needed.  90 tablet  3  . amLODipine (NORVASC) 5 MG tablet Take 1 tablet (5 mg total) by mouth every morning.  90 tablet  3  . amoxicillin (AMOXIL) 500 MG capsule as needed.      . Bevacizumab (AVASTIN IV) Place into the right eye as directed. Every 5 weeks      . clopidogrel (PLAVIX) 75 MG tablet Take 75 mg by mouth Daily.      . digoxin (LANOXIN) 0.25 MG tablet Take 1-2 tablets (0.25-0.5 mg total) by mouth every morning. 1 tablet Monday, Tuesday, Thursday, Friday, and Saturday.. 2 tablet on Sunday and wednesday  125 tablet  3  . famotidine (PEPCID) 40 MG tablet Take 1 tablet (40 mg total) by mouth daily.  90 tablet  3  . latanoprost (XALATAN) 0.005 % ophthalmic solution Place 1 drop into both eyes at bedtime.       Marland Kitchen levothyroxine (SYNTHROID, LEVOTHROID) 100 MCG tablet Take 1-1.5 tablets (100-150 mcg total) by mouth every morning. 1 tablet on Sunday, Tuesday, Thursday, and Saturday.  1.5 tablet on Monday, Wednesday, and Friday.  130 tablet  3  . liothyronine (CYTOMEL) 25 MCG tablet Take 25 mcg by mouth every morning. 1 tablet 6 days a week. Pt does not take on sunday      . metFORMIN (GLUCOPHAGE) 1000 MG  tablet Take 500-1,000 mg by mouth 2 (two) times daily with a meal. 1000 mg in the morning and 500 mg in the pm      . metoprolol succinate (TOPROL-XL) 25 MG 24 hr tablet Take 1 tablet (25 mg total) by mouth every morning.  90 tablet  3  . sildenafil (VIAGRA) 100 MG tablet Take 1 tablet (100 mg total) by mouth daily as needed.  6 tablet  prn  . zolpidem (AMBIEN) 5 MG tablet Take 5 mg by mouth Daily.        Allergies  Allergen Reactions  . Lipitor (Atorvastatin Calcium)   . Zetia (Ezetimibe)     Patient Active Problem List  Diagnoses  . Hypothyroidism  . Benign hypertensive heart disease without heart failure  . Fatigue  . Dyslipidemia  . Diabetes mellitus  . Anxiety and depression  . Macular degeneration of both eyes  . Osteoarthritis  . Osteoarthritis of right knee  . Altered mental status  . Acute blood loss anemia  . Hypokalemia    History  Smoking status  . Never Smoker   Smokeless tobacco  . Not on file    History  Alcohol Use  . 8.4 oz/week  . 14 Shots of liquor per week    Family History  Problem Relation Age of Onset  . Hypertension Mother   . Arthritis Mother   . Diabetes Mother   . Hypertension Father   . Stroke Father   . Kidney disease Father   . Hypertension Brother   . Diabetes Brother     Review of Systems: Constitutional: no fever chills diaphoresis or fatigue or change in weight.  Head and neck: no hearing loss, no epistaxis, no photophobia or visual disturbance. Respiratory: No cough, shortness of breath or wheezing. Cardiovascular: No chest pain peripheral edema, palpitations. Gastrointestinal: No abdominal distention, no abdominal pain, no change in bowel habits hematochezia or melena. Genitourinary: No dysuria, no frequency, no urgency, no nocturia. Musculoskeletal:No arthralgias, no back pain, no gait disturbance or myalgias. Neurological: No dizziness, no headaches, no numbness, no seizures, no syncope, no weakness, no  tremors. Hematologic: No lymphadenopathy, no easy bruising. Psychiatric: No confusion, no hallucinations, no sleep disturbance.    Physical Exam: Filed Vitals:   08/24/11 0913  BP: 118/70  Pulse: 76   the general appearance reveals a well-developed well-nourished gentleman in no distress.  Pupils equal and reactive.   Extraocular Movements are full.  There is no scleral icterus.  The mouth and pharynx are normal.  The neck is supple.  The carotids reveal no bruits.  The jugular venous pressure is normal.  The thyroid is not enlarged.  There is no lymphadenopathy.  The chest is clear to percussion and auscultation. There are no rales or rhonchi. Expansion of the chest is symmetrical.  The precordium is quiet.  The first heart sound is normal.  The second heart sound is physiologically split.  There is no murmur gallop rub or click.  There is no abnormal lift or heave.  The abdomen is soft and nontender. Bowel sounds are normal. The liver and spleen are not enlarged. There Are no abdominal masses. There are no bruits.  Extremities show no phlebitis or edema.  He has good range of motion of the prosthetic knees bilaterally.Strength is normal and symmetrical in all extremities.  There is no lateralizing weakness.  There are no sensory deficits.     Assessment / Plan: Continue same medication.  Recheck in 4 months for office visit EKG and oh labs including lipid panel hepatic function panel nasal metabolic panel CBC TSH and T4

## 2011-08-25 DIAGNOSIS — L578 Other skin changes due to chronic exposure to nonionizing radiation: Secondary | ICD-10-CM | POA: Diagnosis not present

## 2011-08-25 DIAGNOSIS — L570 Actinic keratosis: Secondary | ICD-10-CM | POA: Diagnosis not present

## 2011-08-25 DIAGNOSIS — L821 Other seborrheic keratosis: Secondary | ICD-10-CM | POA: Diagnosis not present

## 2011-08-25 DIAGNOSIS — L819 Disorder of pigmentation, unspecified: Secondary | ICD-10-CM | POA: Diagnosis not present

## 2011-08-26 DIAGNOSIS — H353190 Nonexudative age-related macular degeneration, unspecified eye, stage unspecified: Secondary | ICD-10-CM | POA: Diagnosis not present

## 2011-08-26 DIAGNOSIS — H353290 Exudative age-related macular degeneration, unspecified eye, stage unspecified: Secondary | ICD-10-CM | POA: Diagnosis not present

## 2011-10-07 DIAGNOSIS — H353290 Exudative age-related macular degeneration, unspecified eye, stage unspecified: Secondary | ICD-10-CM | POA: Diagnosis not present

## 2011-10-07 DIAGNOSIS — H35059 Retinal neovascularization, unspecified, unspecified eye: Secondary | ICD-10-CM | POA: Diagnosis not present

## 2011-10-27 DIAGNOSIS — M1710 Unilateral primary osteoarthritis, unspecified knee: Secondary | ICD-10-CM | POA: Diagnosis not present

## 2011-11-25 DIAGNOSIS — H35059 Retinal neovascularization, unspecified, unspecified eye: Secondary | ICD-10-CM | POA: Diagnosis not present

## 2011-11-25 DIAGNOSIS — H353290 Exudative age-related macular degeneration, unspecified eye, stage unspecified: Secondary | ICD-10-CM | POA: Diagnosis not present

## 2011-12-17 ENCOUNTER — Ambulatory Visit (INDEPENDENT_AMBULATORY_CARE_PROVIDER_SITE_OTHER): Payer: Medicare Other | Admitting: Family Medicine

## 2011-12-17 VITALS — BP 142/70 | HR 44 | Temp 98.2°F | Resp 16 | Ht 71.0 in | Wt 163.0 lb

## 2011-12-17 DIAGNOSIS — M79644 Pain in right finger(s): Secondary | ICD-10-CM

## 2011-12-17 DIAGNOSIS — S61209A Unspecified open wound of unspecified finger without damage to nail, initial encounter: Secondary | ICD-10-CM | POA: Diagnosis not present

## 2011-12-17 DIAGNOSIS — L089 Local infection of the skin and subcutaneous tissue, unspecified: Secondary | ICD-10-CM

## 2011-12-17 DIAGNOSIS — M79609 Pain in unspecified limb: Secondary | ICD-10-CM

## 2011-12-17 MED ORDER — CEPHALEXIN 500 MG PO CAPS: 500.0000 mg | ORAL_CAPSULE | Freq: Three times a day (TID) | ORAL | Status: AC

## 2011-12-17 NOTE — Patient Instructions (Addendum)
Take the Keflex 500 mg 3 times daily for 5 days. If that fingertip gets in all worse come back.

## 2011-12-17 NOTE — Progress Notes (Signed)
Subjective: Patient was working in his yard about 3 days ago trimming. His vision is poor. He got something under his right thumbnail. He tried to dig it out. It is hurting him badly now.  Objective: Little dark area just under the tip of his right thumbnail. I trimmed the ends of the nail back, and an tried to remove any debris. Expressible pus. No foreign object UVC now, but I wonder if it came out when I trimmed that nail.  Assessment: Foreign body with infection under tip of right thumbnail  Plan: Keflex 500 3 times a day for 5 days. Return for worse.

## 2011-12-20 LAB — WOUND CULTURE: Gram Stain: NONE SEEN

## 2011-12-22 ENCOUNTER — Other Ambulatory Visit (INDEPENDENT_AMBULATORY_CARE_PROVIDER_SITE_OTHER): Payer: Medicare Other

## 2011-12-22 DIAGNOSIS — R5383 Other fatigue: Secondary | ICD-10-CM | POA: Diagnosis not present

## 2011-12-22 DIAGNOSIS — E039 Hypothyroidism, unspecified: Secondary | ICD-10-CM | POA: Diagnosis not present

## 2011-12-22 DIAGNOSIS — I119 Hypertensive heart disease without heart failure: Secondary | ICD-10-CM | POA: Diagnosis not present

## 2011-12-22 DIAGNOSIS — R5381 Other malaise: Secondary | ICD-10-CM

## 2011-12-22 LAB — HEPATIC FUNCTION PANEL
ALT: 14 U/L (ref 0–53)
AST: 19 U/L (ref 0–37)
Albumin: 4.2 g/dL (ref 3.5–5.2)
Total Protein: 6.7 g/dL (ref 6.0–8.3)

## 2011-12-22 LAB — LIPID PANEL
HDL: 61.6 mg/dL (ref >39.00)
Total CHOL/HDL Ratio: 3
Triglycerides: 38 mg/dL (ref 0.0–149.0)

## 2011-12-22 LAB — BASIC METABOLIC PANEL
CO2: 29 mEq/L (ref 19–32)
Calcium: 9.5 mg/dL (ref 8.4–10.5)
GFR: 77.03 mL/min (ref >60.00)
Sodium: 141 mEq/L (ref 135–145)

## 2011-12-22 LAB — CBC WITH DIFFERENTIAL/PLATELET
Basophils Absolute: 0 10*3/uL (ref 0.0–0.1)
Eosinophils Relative: 2.7 % (ref 0.0–5.0)
Lymphocytes Relative: 28.3 % (ref 12.0–46.0)
Monocytes Relative: 9.7 % (ref 3.0–12.0)
Neutrophils Relative %: 58.7 % (ref 43.0–77.0)
Platelets: 204 10*3/uL (ref 150.0–400.0)
WBC: 5.5 10*3/uL (ref 4.5–10.5)

## 2011-12-22 LAB — TSH: TSH: 0.04 u[IU]/mL — ABNORMAL LOW (ref 0.35–5.50)

## 2011-12-22 LAB — T4, FREE: Free T4: 0.7 ng/dL (ref 0.60–1.60)

## 2011-12-22 NOTE — Progress Notes (Signed)
Quick Note:  Please make copy of labs for patient visit. ______ 

## 2011-12-23 DIAGNOSIS — H2510 Age-related nuclear cataract, unspecified eye: Secondary | ICD-10-CM | POA: Diagnosis not present

## 2011-12-23 DIAGNOSIS — H40059 Ocular hypertension, unspecified eye: Secondary | ICD-10-CM | POA: Diagnosis not present

## 2011-12-24 ENCOUNTER — Telehealth: Payer: Self-pay | Admitting: Cardiology

## 2011-12-24 ENCOUNTER — Encounter: Payer: Self-pay | Admitting: Cardiology

## 2011-12-24 ENCOUNTER — Ambulatory Visit (INDEPENDENT_AMBULATORY_CARE_PROVIDER_SITE_OTHER): Payer: Medicare Other | Admitting: Cardiology

## 2011-12-24 VITALS — BP 130/78 | HR 53 | Ht 73.0 in | Wt 164.0 lb

## 2011-12-24 DIAGNOSIS — R002 Palpitations: Secondary | ICD-10-CM | POA: Diagnosis not present

## 2011-12-24 DIAGNOSIS — E119 Type 2 diabetes mellitus without complications: Secondary | ICD-10-CM

## 2011-12-24 DIAGNOSIS — IMO0001 Reserved for inherently not codable concepts without codable children: Secondary | ICD-10-CM

## 2011-12-24 DIAGNOSIS — F411 Generalized anxiety disorder: Secondary | ICD-10-CM

## 2011-12-24 DIAGNOSIS — E785 Hyperlipidemia, unspecified: Secondary | ICD-10-CM

## 2011-12-24 DIAGNOSIS — I119 Hypertensive heart disease without heart failure: Secondary | ICD-10-CM

## 2011-12-24 DIAGNOSIS — F419 Anxiety disorder, unspecified: Secondary | ICD-10-CM

## 2011-12-24 MED ORDER — ZOLPIDEM TARTRATE 5 MG PO TABS: 5.0000 mg | ORAL_TABLET | Freq: Every evening | ORAL | Status: DC | PRN

## 2011-12-24 MED ORDER — ALPRAZOLAM 0.25 MG PO TABS: 0.2500 mg | ORAL_TABLET | Freq: Every day | ORAL | Status: DC | PRN

## 2011-12-24 MED ORDER — METFORMIN HCL 1000 MG PO TABS: 1000.0000 mg | ORAL_TABLET | Freq: Two times a day (BID) | ORAL | Status: DC

## 2011-12-24 MED ORDER — LIOTHYRONINE SODIUM 25 MCG PO TABS: 25.0000 ug | ORAL_TABLET | ORAL | Status: DC

## 2011-12-24 MED ORDER — CLOPIDOGREL BISULFATE 75 MG PO TABS: 75.0000 mg | ORAL_TABLET | Freq: Every day | ORAL | Status: DC

## 2011-12-24 NOTE — Telephone Encounter (Signed)
Patient called back to report that he is currently taking metoprolol succinate. If there are any questions patient can be reached at hm#

## 2011-12-24 NOTE — Patient Instructions (Addendum)
Your physician recommends that you schedule a follow-up appointment in: 4 months with fasting labs (lp/bmet/hfp/A1C)   Increase your Metformin to 1000 mg twice a day as discussed

## 2011-12-24 NOTE — Assessment & Plan Note (Signed)
The patient has dyslipidemia.  He is intolerant of statins.  Presently his lipids are satisfactory with careful diet and exercise regimen

## 2011-12-24 NOTE — Assessment & Plan Note (Signed)
His blood pressure was remaining stable on current therapy.  He is not having any dizziness or syncope or chest pain.  No symptoms of CHF.  He notes that his balance has not been as good since he had his knee operations.  This may be due in part to his diabetes.  The patient is not having any peripheral edema from his amlodipine.

## 2011-12-24 NOTE — Assessment & Plan Note (Signed)
The patient has not been as careful with his diet.  He has been taking 1500 mg of metformin daily.  His blood sugars are still running high.  He is not having any hypoglycemia.  We will increase his metformin to 1000 mg twice a day.  We will plan to check a hemoglobin A1c at his next office visit.  He is going to try to tighten up on his diet as well

## 2011-12-24 NOTE — Progress Notes (Signed)
Stephen Wilkinson Date of Birth:  May 20, 1929 [facility] [address] [place], [place]  [address] [phone]         Fax   [phone]  History of Present Illness: This pleasant 78 year old gentleman is seen for a scheduled followup office visit.  He has a history of diabetes and high blood pressure.  He does not have any history of ischemic heart disease.  He has had a past history of palpitations.  He has hypothyroidism BPH and osteoarthritis.  He has had both of his knees replaced.  His last visit he has had no new cardiac symptoms.  Current Outpatient Prescriptions  Medication Sig Dispense Refill  . ALPRAZolam (XANAX) 0.25 MG tablet Take 1 tablet (0.25 mg total) by mouth daily as needed.  90 tablet  1  . amLODipine (NORVASC) 5 MG tablet Take 1 tablet (5 mg total) by mouth every morning.  90 tablet  3  . amoxicillin (AMOXIL) 500 MG capsule as needed.      . Bevacizumab (AVASTIN IV) Place into the right eye as directed. Every 5 weeks      . cephALEXin (KEFLEX) 500 MG capsule Take 1 capsule (500 mg total) by mouth 3 (three) times daily.  15 capsule  0  . clopidogrel (PLAVIX) 75 MG tablet Take 1 tablet (75 mg total) by mouth daily.  90 tablet  3  . digoxin (LANOXIN) 0.25 MG tablet Take 1-2 tablets (0.25-0.5 mg total) by mouth every morning. 1 tablet Monday, Tuesday, Thursday, Friday, and Saturday.. 2 tablet on Sunday and wednesday  125 tablet  3  . famotidine (PEPCID) 40 MG tablet Take 1 tablet (40 mg total) by mouth daily.  90 tablet  3  . latanoprost (XALATAN) 0.005 % ophthalmic solution Place 1 drop into both eyes at bedtime.       Marland Kitchen levothyroxine (SYNTHROID, LEVOTHROID) 100 MCG tablet Take 1-1.5 tablets (100-150 mcg total) by mouth every morning. 1 tablet on Sunday, Tuesday, Thursday, and Saturday.  1.5 tablet on Monday, Wednesday, and Friday.  130 tablet  3  . liothyronine (CYTOMEL) 25 MCG tablet Take 1 tablet (25 mcg total) by mouth every morning. 1 tablet 6 days a week.  Pt does not take on sunday  90 tablet  3  . metFORMIN (GLUCOPHAGE) 1000 MG tablet Take 1 tablet (1,000 mg total) by mouth 2 (two) times daily.  180 tablet  3  . metoprolol succinate (TOPROL-XL) 25 MG 24 hr tablet Take 1 tablet (25 mg total) by mouth every morning.  90 tablet  3  . metoprolol tartrate (LOPRESSOR) 25 MG tablet Take 25 mg by mouth 2 (two) times daily.      Marland Kitchen ofloxacin (OCUFLOX) 0.3 % ophthalmic solution as directed.      . sildenafil (VIAGRA) 100 MG tablet Take 1 tablet (100 mg total) by mouth daily as needed.  6 tablet  prn  . zolpidem (AMBIEN) 5 MG tablet Take 1 tablet (5 mg total) by mouth at bedtime as needed for sleep.  90 tablet  1  . DISCONTD: liothyronine (CYTOMEL) 25 MCG tablet Take 25 mcg by mouth every morning. 1 tablet 6 days a week. Pt does not take on sunday      . DISCONTD: metFORMIN (GLUCOPHAGE) 1000 MG tablet Take 500-1,000 mg by mouth 2 (two) times daily with a meal. 1000 mg in the morning and 500 mg in the pm      . DISCONTD: warfarin (COUMADIN) 3 MG tablet Take 1 tablet (  3 mg total) by mouth one time only at 6 PM.  35 tablet  0    Allergies  Allergen Reactions  . Lipitor (Atorvastatin Calcium)   . Zetia (Ezetimibe)     Patient Active Problem List  Diagnosis  . Hypothyroidism  . Benign hypertensive heart disease without heart failure  . Fatigue  . Dyslipidemia  . Diabetes mellitus  . Anxiety and depression  . Macular degeneration of both eyes  . Osteoarthritis  . Osteoarthritis of right knee  . Altered mental status  . Acute blood loss anemia  . Hypokalemia    History  Smoking status  . Never Smoker   Smokeless tobacco  . Not on file    History  Alcohol Use  . 8.4 oz/week  . 14 Shots of liquor per week    Family History  Problem Relation Age of Onset  . Hypertension Mother   . Arthritis Mother   . Diabetes Mother   . Hypertension Father   . Stroke Father   . Kidney disease Father   . Hypertension Brother   . Diabetes Brother      Review of Systems: Constitutional: no fever chills diaphoresis or fatigue or change in weight.  Head and neck: no hearing loss, no epistaxis, no photophobia or visual disturbance. Respiratory: No cough, shortness of breath or wheezing. Cardiovascular: No chest pain peripheral edema, palpitations. Gastrointestinal: No abdominal distention, no abdominal pain, no change in bowel habits hematochezia or melena. Genitourinary: No dysuria, no frequency, no urgency, no nocturia. Musculoskeletal:No arthralgias, no back pain, no gait disturbance or myalgias. Neurological: No dizziness, no headaches, no numbness, no seizures, no syncope, no weakness, no tremors. Hematologic: No lymphadenopathy, no easy bruising. Psychiatric: No confusion, no hallucinations, no sleep disturbance.    Physical Exam: Filed Vitals:   12/24/11 1004  BP: 130/78  Pulse: 53   the general appearance reveals a well-developed well-nourished gentleman in no distress.  He is in a good mood today and feels more encouraged about his health.The head and neck exam reveals pupils equal and reactive.  Extraocular movements are full.  There is no scleral icterus.  The mouth and pharynx are normal.  The neck is supple.  The carotids reveal no bruits.  The jugular venous pressure is normal.  The  thyroid is not enlarged.  There is no lymphadenopathy.  The chest is clear to percussion and auscultation.  There are no rales or rhonchi.  Expansion of the chest is symmetrical.  The precordium is quiet.  The first heart sound is normal.  The second heart sound is physiologically split.  There is no murmur gallop rub or click.  There is no abnormal lift or heave.  The abdomen is soft and nontender.  The bowel sounds are normal.  The liver and spleen are not enlarged.  There are no abdominal masses.  There are no abdominal bruits.  Extremities reveal good pedal pulses.  There is no phlebitis or edema.  There is no cyanosis or clubbing.  Strength is  normal and symmetrical in all extremities.  There is no lateralizing weakness.  There are no sensory deficits.  The skin is warm and dry.  There is no rash.   EKG today shows sinus bradycardia and poor R-wave progression V1 through V3 and no ischemic changes and is unchanged since 04/27/11  Assessment / Plan: Continue same medication except increase metformin to 1000 mg twice a day.  Be more careful with diabetic diet.  Recheck in 4  months for followup office visit lipid panel hepatic function panel nasal metabolic panel and hemoglobin H8I

## 2011-12-25 NOTE — Telephone Encounter (Signed)
Changed on medication list.

## 2012-01-04 DIAGNOSIS — L578 Other skin changes due to chronic exposure to nonionizing radiation: Secondary | ICD-10-CM | POA: Diagnosis not present

## 2012-01-04 DIAGNOSIS — D485 Neoplasm of uncertain behavior of skin: Secondary | ICD-10-CM | POA: Diagnosis not present

## 2012-01-04 DIAGNOSIS — L570 Actinic keratosis: Secondary | ICD-10-CM | POA: Diagnosis not present

## 2012-01-06 DIAGNOSIS — H35059 Retinal neovascularization, unspecified, unspecified eye: Secondary | ICD-10-CM | POA: Diagnosis not present

## 2012-01-06 DIAGNOSIS — H353290 Exudative age-related macular degeneration, unspecified eye, stage unspecified: Secondary | ICD-10-CM | POA: Diagnosis not present

## 2012-01-18 DIAGNOSIS — N401 Enlarged prostate with lower urinary tract symptoms: Secondary | ICD-10-CM | POA: Diagnosis not present

## 2012-02-17 DIAGNOSIS — H35059 Retinal neovascularization, unspecified, unspecified eye: Secondary | ICD-10-CM | POA: Diagnosis not present

## 2012-02-17 DIAGNOSIS — H353290 Exudative age-related macular degeneration, unspecified eye, stage unspecified: Secondary | ICD-10-CM | POA: Diagnosis not present

## 2012-03-11 DIAGNOSIS — Z23 Encounter for immunization: Secondary | ICD-10-CM | POA: Diagnosis not present

## 2012-03-17 ENCOUNTER — Encounter: Payer: Self-pay | Admitting: Cardiology

## 2012-03-17 ENCOUNTER — Other Ambulatory Visit: Payer: Self-pay | Admitting: Cardiology

## 2012-03-21 ENCOUNTER — Ambulatory Visit (INDEPENDENT_AMBULATORY_CARE_PROVIDER_SITE_OTHER): Payer: Medicare Other | Admitting: Family Medicine

## 2012-03-21 ENCOUNTER — Ambulatory Visit: Payer: Medicare Other

## 2012-03-21 VITALS — BP 151/64 | HR 77 | Temp 100.6°F | Resp 20 | Ht 71.5 in | Wt 162.0 lb

## 2012-03-21 DIAGNOSIS — B349 Viral infection, unspecified: Secondary | ICD-10-CM

## 2012-03-21 DIAGNOSIS — R05 Cough: Secondary | ICD-10-CM

## 2012-03-21 DIAGNOSIS — B9789 Other viral agents as the cause of diseases classified elsewhere: Secondary | ICD-10-CM

## 2012-03-21 DIAGNOSIS — E119 Type 2 diabetes mellitus without complications: Secondary | ICD-10-CM | POA: Diagnosis not present

## 2012-03-21 DIAGNOSIS — H6120 Impacted cerumen, unspecified ear: Secondary | ICD-10-CM | POA: Diagnosis not present

## 2012-03-21 DIAGNOSIS — J029 Acute pharyngitis, unspecified: Secondary | ICD-10-CM

## 2012-03-21 DIAGNOSIS — D65 Disseminated intravascular coagulation [defibrination syndrome]: Secondary | ICD-10-CM | POA: Diagnosis not present

## 2012-03-21 LAB — POCT CBC
Granulocyte percent: 79.2 %G (ref 37–80)
Hemoglobin: 13.1 g/dL — AB (ref 14.1–18.1)
MCH, POC: 31 pg (ref 27–31.2)
MPV: 6.7 fL (ref 0–99.8)
POC Granulocyte: 5.5 (ref 2–6.9)
POC MID %: 8.7 %M (ref 0–12)
RBC: 4.22 M/uL — AB (ref 4.69–6.13)
WBC: 6.9 10*3/uL (ref 4.6–10.2)

## 2012-03-21 LAB — GLUCOSE, POCT (MANUAL RESULT ENTRY): POC Glucose: 177 mg/dl — AB (ref 70–99)

## 2012-03-21 MED ORDER — AMOXICILLIN 875 MG PO TABS: 875.0000 mg | ORAL_TABLET | Freq: Two times a day (BID) | ORAL | Status: DC

## 2012-03-21 NOTE — Progress Notes (Addendum)
Subjective: 78 year old man with history of a respiratory tract infection for the past he has a very sore throat. He's been coughing. He took some Mucinex without much relief.  He feels weak. No major headache or ear pain. No GI complaints except for a tiny bit of nausea. His long list of problems and medication is reviewed.  Objective: Alert and oriented. Fine tremor of his lip and hands. TMs are occluded by lots of cerumen bilaterally. Throat was erythematous with some exudate. Strep screen was taken and was negative. Neck supple without significant nodes. Chest is clear to auscultation. Heart regular without murmurs gallops or arrhythmias.      Results for orders placed in visit on 03/21/12  POCT CBC      Component Value Range   WBC 6.9  4.6 - 10.2 K/uL   Lymph, poc 0.8  0.6 - 3.4   POC LYMPH PERCENT 12.1  10 - 50 %L   MID (cbc) 0.6  0 - 0.9   POC MID % 8.7  0 - 12 %M   POC Granulocyte 5.5  2 - 6.9   Granulocyte percent 79.2  37 - 80 %G   RBC 4.22 (*) 4.69 - 6.13 M/uL   Hemoglobin 13.1 (*) 14.1 - 18.1 g/dL   HCT, POC 16.1 (*) 09.6 - 53.7 %   MCV 97.8 (*) 80 - 97 fL   MCH, POC 31.0  27 - 31.2 pg   MCHC 31.7 (*) 31.8 - 35.4 g/dL   RDW, POC 04.5     Platelet Count, POC 176  142 - 424 K/uL   MPV 6.7  0 - 99.8 fL  POCT RAPID STREP A (OFFICE)      Component Value Range   Rapid Strep A Screen Negative  Negative  GLUCOSE, POCT (MANUAL RESULT ENTRY)      Component Value Range   POC Glucose 177 (*) 70 - 99 mg/dl   [facility] reading (PRIMARY) by  Dr. Alwyn Ren Normal cxr No pneumonia or infiltrate..  Assessment: Pharyngitis Cough Fever Diabetes unsatisfactory control  Plan: Even though this may all just be viral and treating him with amoxicillin. We will try to clean the wax out of his ears.  Cerumen was irrigated out of the canals. There is a little scratch in the right ear canal that has a little blood. TMs look fine. Instructions given him.

## 2012-03-21 NOTE — Patient Instructions (Addendum)
Take Tylenol for fever and aching.   Continue using the Mucinex to try and thin the secretions. If you stay well hydrated it helps this also, so drink lots of water.    Amoxicillin twice daily for infection. If this is a virus rather than a bacterial infection however it will just have to right its course it may take some time.    Drink plenty of fluids and get enough rest    It is probably a good habit for people who bit of wax in the ears such as you have to get some over-the-counter Debrox and use it about every 3 or 4 months to try and keep the wax from building up.       Viral and Bacterial Pharyngitis Pharyngitis is soreness (inflammation) or infection of the pharynx. It is also called a sore throat. CAUSES  Most sore throats are caused by viruses and are part of a cold. However, some sore throats are caused by strep and other bacteria. Sore throats can also be caused by post nasal drip from draining sinuses, allergies and sometimes from sleeping with an open mouth. Infectious sore throats can be spread from person to person by coughing, sneezing and sharing cups or eating utensils. TREATMENT  Sore throats that are viral usually last 3-4 days. Viral illness will get better without medications (antibiotics). Strep throat and other bacterial infections will usually begin to get better about 24-48 hours after you begin to take antibiotics. HOME CARE INSTRUCTIONS   If the caregiver feels there is a bacterial infection or if there is a positive strep test, they will prescribe an antibiotic. The full course of antibiotics must be taken. If the full course of antibiotic is not taken, you or your child may become ill again. If you or your child has strep throat and do not finish all of the medication, serious heart or kidney diseases may develop.  Drink enough water and fluids to keep your urine clear or pale yellow.  Only take over-the-counter or prescription medicines for pain,  discomfort or fever as directed by your caregiver.  Get lots of rest.  Gargle with salt water ( tsp. of salt in a glass of water) as often as every 1-2 hours as you need for comfort.  Hard candies may soothe the throat if individual is not at risk for choking. Throat sprays or lozenges may also be used. SEEK MEDICAL CARE IF:   Large, tender lumps in the neck develop.  A rash develops.  Green, yellow-brown or bloody sputum is coughed up.  Your baby is older than 3 months with a rectal temperature of 100.5 F (38.1 C) or higher for more than 1 day. SEEK IMMEDIATE MEDICAL CARE IF:   A stiff neck develops.  You or your child are drooling or unable to swallow liquids.  You or your child are vomiting, unable to keep medications or liquids down.  You or your child has severe pain, unrelieved with recommended medications.  You or your child are having difficulty breathing (not due to stuffy nose).  You or your child are unable to fully open your mouth.  You or your child develop redness, swelling, or severe pain anywhere on the neck.  You have a fever.  Your baby is older than 3 months with a rectal temperature of 102 F (38.9 C) or higher.  Your baby is 16 months old or younger with a rectal temperature of 100.4 F (38 C) or higher. MAKE SURE  YOU:   Understand these instructions.  Will watch your condition.  Will get help right away if you are not doing well or get worse. Document Released: 06/01/2005 Document Revised: 08/24/2011 Document Reviewed: 08/29/2007 [facility] Patient Information 2013 [place], [place].

## 2012-03-24 ENCOUNTER — Telehealth: Payer: Self-pay

## 2012-03-24 NOTE — Telephone Encounter (Signed)
Please advise patient to increase his fluid intake coupled with 1-2 capfulls of Miralax mixed in with 8 oz of water daily over the next several days.

## 2012-03-24 NOTE — Telephone Encounter (Signed)
Pt reports that the amox does seem to be starting help his Sxs, but he does still have chest cong and cough and he expresses some concern that he has a hard time getting rid of chest colds and he might need a stronger Abx. Advised pt that it is a good sign that he has started to improve and that he should cont the Amox and complete round unless he worsens or Sxs persist after amox is completed.  Pt's main problem is that he has been constipated since starting Amox. D/W pt that this is not a usual SE and that it is more likely to cause diarrhea. Pt did report that he hadn't had as much to eat the first couple of days he was sick, but that he has not had a BM since 10/6 and he normally goes QD. Pt tried to give himself a Fleet's enema today and had some difficulty getting entire dose in, but he just got a very little BM out and "it was rock hard". Pt reports that he does not drink enough water, but that is not new. Encouraged pt to increase water and try to get a little exercise. Pt reports that he is not uncomfortable, but he is just afraid he will have increased trouble as he starts to eat more. Pt wants to know if he should try another Fleet's tomorrow, or if we want him to try something else.

## 2012-03-24 NOTE — Telephone Encounter (Signed)
Dr hopper  Patient would like to talk to you regarding his amoxicillaim please call

## 2012-03-24 NOTE — Telephone Encounter (Signed)
I spoke to patient to advise.  

## 2012-03-28 ENCOUNTER — Ambulatory Visit (INDEPENDENT_AMBULATORY_CARE_PROVIDER_SITE_OTHER): Payer: Medicare Other | Admitting: Family Medicine

## 2012-03-28 VITALS — BP 132/69 | HR 65 | Temp 98.2°F | Resp 16 | Ht 72.5 in | Wt 161.4 lb

## 2012-03-28 DIAGNOSIS — J40 Bronchitis, not specified as acute or chronic: Secondary | ICD-10-CM | POA: Diagnosis not present

## 2012-03-28 MED ORDER — AZITHROMYCIN 250 MG PO TABS: ORAL_TABLET | ORAL | Status: DC

## 2012-03-28 NOTE — Patient Instructions (Addendum)
I recommend starting a probiotic supplement since you have had several courses of antibiotics now.  Any over the counter product should be fine.  You may want to look for one that includes Lactobacillus.  Alternatively, eating yogurt with "live active cultures" in it (most do) should also do the trick.  Bronchitis Bronchitis is the body's way of reacting to injury and/or infection (inflammation) of the bronchi. Bronchi are the air tubes that extend from the windpipe into the lungs. If the inflammation becomes severe, it may cause shortness of breath. CAUSES  Inflammation may be caused by:  A virus.  Germs (bacteria).  Dust.  Allergens.  Pollutants and many other irritants. The cells lining the bronchial tree are covered with tiny hairs (cilia). These constantly beat upward, away from the lungs, toward the mouth. This keeps the lungs free of pollutants. When these cells become too irritated and are unable to do their job, mucus begins to develop. This causes the characteristic cough of bronchitis. The cough clears the lungs when the cilia are unable to do their job. Without either of these protective mechanisms, the mucus would settle in the lungs. Then you would develop pneumonia. Smoking is a common cause of bronchitis and can contribute to pneumonia. Stopping this habit is the single most important thing you can do to help yourself. TREATMENT   Your caregiver may prescribe an antibiotic if the cough is caused by bacteria. Also, medicines that open up your airways make it easier to breathe. Your caregiver may also recommend or prescribe an expectorant. It will loosen the mucus to be coughed up. Only take over-the-counter or prescription medicines for pain, discomfort, or fever as directed by your caregiver.  Removing whatever causes the problem (smoking, for example) is critical to preventing the problem from getting worse.  Cough suppressants may be prescribed for relief of cough  symptoms.  Inhaled medicines may be prescribed to help with symptoms now and to help prevent problems from returning.  For those with recurrent (chronic) bronchitis, there may be a need for steroid medicines. SEEK IMMEDIATE MEDICAL CARE IF:   During treatment, you develop more pus-like mucus (purulent sputum).  You have a fever.  Your baby is older than 3 months with a rectal temperature of 102 F (38.9 C) or higher.  Your baby is 63 months old or younger with a rectal temperature of 100.4 F (38 C) or higher.  You become progressively more ill.  You have increased difficulty breathing, wheezing, or shortness of breath. It is necessary to seek immediate medical care if you are elderly or sick from any other disease. MAKE SURE YOU:   Understand these instructions.  Will watch your condition.  Will get help right away if you are not doing well or get worse. Document Released: 06/01/2005 Document Revised: 08/24/2011 Document Reviewed: 04/10/2008 [facility] Patient Information 2013 [place], [place].

## 2012-03-28 NOTE — Progress Notes (Signed)
Subjective:    Patient ID: Stephen Wilkinson, male    DOB: 12-13-1928, 78 y.o.   MRN: [id number]  HPI  Seen last Mon for cough and after a nml cbc, cxr, and strep was treated w/ a 7d course of amoxicillin - just finished - and feeling much better except has started wheezing when lays down w/ a lot of congestion. Fatigue is normal.  Really wants something stronger to knock it out.  States he has a h/o getting very severe pulmonary congestion and infections that are difficult to treat and he always requires a very strong antibiotic.  Sleeping ok, cough non-prod, fine when sitting up. Much worse when laying flat.  He is concerned that if he doesn't continue antibiotic therapy, he will get sick again since sxs haven't totally resolved.  Massive constipation. Miralax didn't work.  Took something yest (?mag citrate) that was a little to successful  Past Medical History  Diagnosis Date  . Hypertension   . Hyperlipidemia   . Diabetes mellitus   . Hypothyroidism   . Macular degeneration   . OA (osteoarthritis)   . Anxiety   . Depression   . PVC's (premature ventricular contractions)   . Hx: UTI (urinary tract infection)   . Insomnia   . Stroke     mid 1990s   . Pneumonia   . Chronic kidney disease     hx of uti due to catheter hx of prostatitis, BPH -   . Cancer     basal and squamous skin carcinoma   . Acute blood loss anemia 05/15/2011     Review of Systems  Constitutional: Positive for fatigue. Negative for fever, chills and diaphoresis.  HENT: Positive for congestion. Negative for sore throat, rhinorrhea, trouble swallowing, postnasal drip and sinus pressure.   Respiratory: Positive for cough and wheezing. Negative for choking and shortness of breath.   Cardiovascular: Negative for chest pain.  Gastrointestinal: Positive for constipation. Negative for nausea and vomiting.  Neurological: Negative for dizziness.  Psychiatric/Behavioral: Negative for disturbed wake/sleep cycle.      BP  132/69  Pulse 65  Temp 98.2 F (36.8 C) (Oral)  Resp 16  Ht 6' 0.5" (1.842 m)  Wt 161 lb 6.4 oz (73.211 kg)  BMI 21.59 kg/m2  SpO2 98%  Objective:   Physical Exam  Constitutional: He is oriented to person, place, and time. He appears well-developed and well-nourished. No distress.  HENT:  Head: Normocephalic and atraumatic.  Right Ear: External ear normal.  Left Ear: External ear normal.  Nose: Nose normal.  Mouth/Throat: Oropharynx is clear and moist. No oropharyngeal exudate.  Eyes: Conjunctivae normal are normal. Right eye exhibits no discharge. Left eye exhibits no discharge. No scleral icterus.  Neck: Normal range of motion. Neck supple. No thyromegaly present.  Cardiovascular: Normal rate, regular rhythm, normal heart sounds and intact distal pulses.   Pulmonary/Chest: Effort normal and breath sounds normal. No stridor. No respiratory distress. He has no wheezes. He has no rales.  Musculoskeletal: He exhibits no edema.  Lymphadenopathy:    He has no cervical adenopathy.  Neurological: He is alert and oriented to person, place, and time.  Skin: Skin is warm and dry. He is not diaphoretic.  Psychiatric: He has a normal mood and affect. His behavior is normal.       Assessment & Plan:  1. Bronchitis - Discussed options. Pt has used zpack w/ success in the past so will try this.  Discussed options of steroid injection or  albuterol inhaler to help w/ his wheezing but he declines at this time - thinks the zpack will be sufficient but if has more wheezing will call for alb inh. 2. Constipation - cont prn miralax. Start probiotic supp.  I have reviewed and agree with documentation. Robert P. Merla Riches, M.D.

## 2012-03-30 DIAGNOSIS — H35059 Retinal neovascularization, unspecified, unspecified eye: Secondary | ICD-10-CM | POA: Diagnosis not present

## 2012-03-30 DIAGNOSIS — H353290 Exudative age-related macular degeneration, unspecified eye, stage unspecified: Secondary | ICD-10-CM | POA: Diagnosis not present

## 2012-04-11 DIAGNOSIS — L578 Other skin changes due to chronic exposure to nonionizing radiation: Secondary | ICD-10-CM | POA: Diagnosis not present

## 2012-04-11 DIAGNOSIS — L570 Actinic keratosis: Secondary | ICD-10-CM | POA: Diagnosis not present

## 2012-04-11 DIAGNOSIS — D485 Neoplasm of uncertain behavior of skin: Secondary | ICD-10-CM | POA: Diagnosis not present

## 2012-04-15 ENCOUNTER — Other Ambulatory Visit: Payer: Self-pay | Admitting: *Deleted

## 2012-04-15 DIAGNOSIS — R5383 Other fatigue: Secondary | ICD-10-CM

## 2012-04-15 DIAGNOSIS — E039 Hypothyroidism, unspecified: Secondary | ICD-10-CM

## 2012-04-15 DIAGNOSIS — E785 Hyperlipidemia, unspecified: Secondary | ICD-10-CM

## 2012-04-15 DIAGNOSIS — D62 Acute posthemorrhagic anemia: Secondary | ICD-10-CM

## 2012-04-15 DIAGNOSIS — I119 Hypertensive heart disease without heart failure: Secondary | ICD-10-CM

## 2012-04-15 DIAGNOSIS — E876 Hypokalemia: Secondary | ICD-10-CM

## 2012-04-19 DIAGNOSIS — M1710 Unilateral primary osteoarthritis, unspecified knee: Secondary | ICD-10-CM | POA: Diagnosis not present

## 2012-04-21 ENCOUNTER — Other Ambulatory Visit (INDEPENDENT_AMBULATORY_CARE_PROVIDER_SITE_OTHER): Payer: Medicare Other

## 2012-04-21 ENCOUNTER — Other Ambulatory Visit: Payer: Self-pay

## 2012-04-21 DIAGNOSIS — R5381 Other malaise: Secondary | ICD-10-CM

## 2012-04-21 DIAGNOSIS — E039 Hypothyroidism, unspecified: Secondary | ICD-10-CM | POA: Diagnosis not present

## 2012-04-21 DIAGNOSIS — D62 Acute posthemorrhagic anemia: Secondary | ICD-10-CM

## 2012-04-21 DIAGNOSIS — R5383 Other fatigue: Secondary | ICD-10-CM

## 2012-04-21 DIAGNOSIS — E785 Hyperlipidemia, unspecified: Secondary | ICD-10-CM | POA: Diagnosis not present

## 2012-04-21 DIAGNOSIS — I119 Hypertensive heart disease without heart failure: Secondary | ICD-10-CM

## 2012-04-21 LAB — BASIC METABOLIC PANEL
CO2: 30 mEq/L (ref 19–32)
GFR: 74.36 mL/min (ref >60.00)
Glucose, Bld: 106 mg/dL — ABNORMAL HIGH (ref 70–99)
Potassium: 4.2 mEq/L (ref 3.5–5.1)
Sodium: 138 mEq/L (ref 135–145)

## 2012-04-21 LAB — CBC WITH DIFFERENTIAL/PLATELET
Basophils Absolute: 0 10*3/uL (ref 0.0–0.1)
HCT: 42.1 % (ref 39.0–52.0)
Hemoglobin: 14.1 g/dL (ref 13.0–17.0)
Lymphs Abs: 1.5 10*3/uL (ref 0.7–4.0)
MCV: 95.7 fl (ref 78.0–100.0)
Monocytes Absolute: 0.6 10*3/uL (ref 0.1–1.0)
Monocytes Relative: 11.5 % (ref 3.0–12.0)
Neutro Abs: 2.8 10*3/uL (ref 1.4–7.7)
Platelets: 217 10*3/uL (ref 150.0–400.0)
RDW: 13.8 % (ref 11.5–14.6)

## 2012-04-21 LAB — LIPID PANEL
Cholesterol: 160 mg/dL (ref 0–200)
HDL: 52.6 mg/dL (ref >39.00)
Total CHOL/HDL Ratio: 3
Triglycerides: 55 mg/dL (ref 0.0–149.0)

## 2012-04-21 LAB — HEPATIC FUNCTION PANEL
AST: 21 U/L (ref 0–37)
Albumin: 4.3 g/dL (ref 3.5–5.2)
Alkaline Phosphatase: 66 U/L (ref 39–117)
Total Protein: 7.2 g/dL (ref 6.0–8.3)

## 2012-04-21 LAB — HEMOGLOBIN A1C: Hgb A1c MFr Bld: 6.1 % (ref 4.6–6.5)

## 2012-04-21 NOTE — Progress Notes (Signed)
Quick Note:  Please make copy of labs for patient visit. ______ 

## 2012-04-25 ENCOUNTER — Ambulatory Visit (INDEPENDENT_AMBULATORY_CARE_PROVIDER_SITE_OTHER): Payer: Medicare Other | Admitting: Cardiology

## 2012-04-25 ENCOUNTER — Encounter: Payer: Self-pay | Admitting: Cardiology

## 2012-04-25 VITALS — BP 130/70 | HR 87 | Resp 18 | Wt 161.2 lb

## 2012-04-25 DIAGNOSIS — I119 Hypertensive heart disease without heart failure: Secondary | ICD-10-CM | POA: Diagnosis not present

## 2012-04-25 DIAGNOSIS — G4700 Insomnia, unspecified: Secondary | ICD-10-CM

## 2012-04-25 DIAGNOSIS — F411 Generalized anxiety disorder: Secondary | ICD-10-CM | POA: Diagnosis not present

## 2012-04-25 DIAGNOSIS — R5383 Other fatigue: Secondary | ICD-10-CM

## 2012-04-25 DIAGNOSIS — E039 Hypothyroidism, unspecified: Secondary | ICD-10-CM | POA: Diagnosis not present

## 2012-04-25 DIAGNOSIS — F419 Anxiety disorder, unspecified: Secondary | ICD-10-CM

## 2012-04-25 DIAGNOSIS — E785 Hyperlipidemia, unspecified: Secondary | ICD-10-CM

## 2012-04-25 DIAGNOSIS — E119 Type 2 diabetes mellitus without complications: Secondary | ICD-10-CM

## 2012-04-25 DIAGNOSIS — R5381 Other malaise: Secondary | ICD-10-CM

## 2012-04-25 MED ORDER — AMLODIPINE BESYLATE 5 MG PO TABS: 5.0000 mg | ORAL_TABLET | ORAL | Status: DC

## 2012-04-25 MED ORDER — ZOLPIDEM TARTRATE 5 MG PO TABS: 5.0000 mg | ORAL_TABLET | Freq: Every evening | ORAL | Status: DC | PRN

## 2012-04-25 MED ORDER — METOPROLOL SUCCINATE ER 25 MG PO TB24: 25.0000 mg | ORAL_TABLET | ORAL | Status: DC

## 2012-04-25 MED ORDER — ALPRAZOLAM 0.25 MG PO TABS: 0.2500 mg | ORAL_TABLET | Freq: Every day | ORAL | Status: DC | PRN

## 2012-04-25 NOTE — Assessment & Plan Note (Signed)
The patient has a history of essential hypertension.  He has not been having any headaches or dizzy spells.  He has been staying active doing yard work and he also walks daily in his neighborhood when the weather permits.  He has not been expressing any chest pain to suggest angina pectoris.  He does not have any history of known ischemic heart disease and his last nuclear stress test was several years ago.

## 2012-04-25 NOTE — Assessment & Plan Note (Signed)
He feels that his blood sugars are fluctuating more than they have in the past.  We will have him start testing his blood sugars at home on a twice a day schedule.

## 2012-04-25 NOTE — Assessment & Plan Note (Signed)
The patient's symptoms of chronic fatigue have improved.  He has been more physically active.  He has lost 3 pounds since last visit.

## 2012-04-25 NOTE — Patient Instructions (Signed)
Your physician recommends that you continue on your current medications as directed. Please refer to the Current Medication list given to you today.  Your physician recommends that you schedule a follow-up appointment in: 4 months with fasting labs (lp/bmet/hfp/tsh/ft4/a1c)

## 2012-04-25 NOTE — Assessment & Plan Note (Signed)
We reviewed his lipids which are satisfactory on current therapy

## 2012-04-25 NOTE — Assessment & Plan Note (Signed)
The patient is clinically euthyroid.  We last checked his thyroid function studies 4 months ago and they were satisfactory and he remains on Synthroid and Cytomel

## 2012-04-25 NOTE — Progress Notes (Signed)
Stephen Wilkinson Date of Birth:  1929-02-25 [facility] [address] [place], [place]  [address] [phone]         Fax   [phone]  History of Present Illness: This pleasant 78 year old gentleman is seen for a scheduled followup office visit. He has a history of diabetes and high blood pressure. He does not have any history of ischemic heart disease. He has had a past history of palpitations. He has hypothyroidism, BPH and osteoarthritis. He has had both of his knees replaced.  Since his last visit he has had no new cardiac symptoms.   Current Outpatient Prescriptions  Medication Sig Dispense Refill  . ALPRAZolam (XANAX) 0.25 MG tablet Take 1 tablet (0.25 mg total) by mouth daily as needed.  90 tablet  1  . amoxicillin (AMOXIL) 500 MG capsule 500 mg as needed. TAKE 4 CAPSULE  IN AM PRIOR TO DENTAL APPOINTMENT.      Marland Kitchen amoxicillin (AMOXIL) 875 MG tablet Take 1 tablet (875 mg total) by mouth 2 (two) times daily.  20 tablet  0  . aspirin 81 MG chewable tablet Chew 81 mg by mouth daily.      Marland Kitchen azithromycin (ZITHROMAX) 250 MG tablet Take 2 tabs PO x 1 dose, then 1 tab PO QD x 4 days  6 tablet  0  . Bevacizumab (AVASTIN IV) Place into the right eye as directed. Every 5 weeks      . clopidogrel (PLAVIX) 75 MG tablet Take 1 tablet (75 mg total) by mouth daily.  90 tablet  3  . digoxin (LANOXIN) 0.25 MG tablet Take 1-2 tablets (0.25-0.5 mg total) by mouth every morning. 1 tablet Monday, Tuesday, Thursday, Friday, and Saturday.. 2 tablet on Sunday and wednesday  125 tablet  3  . famotidine (PEPCID) 40 MG tablet Take 1 tablet (40 mg total) by mouth daily.  90 tablet  3  . latanoprost (XALATAN) 0.005 % ophthalmic solution Place 1 drop into both eyes at bedtime.       Marland Kitchen levothyroxine (SYNTHROID, LEVOTHROID) 100 MCG tablet Take 1-1.5 tablets (100-150 mcg total) by mouth every morning. 1 tablet on Sunday, Tuesday, Thursday, and Saturday.  1.5 tablet on Monday, Wednesday, and Friday.  130  tablet  3  . liothyronine (CYTOMEL) 25 MCG tablet Take 1 tablet (25 mcg total) by mouth every morning. 1 tablet 6 days a week. Pt does not take on sunday  90 tablet  3  . metFORMIN (GLUCOPHAGE) 1000 MG tablet Take 1 tablet (1,000 mg total) by mouth 2 (two) times daily.  180 tablet  3  . multivitamin-lutein (OCUVITE-LUTEIN) CAPS Take 1 capsule by mouth daily.      Marland Kitchen ofloxacin (OCUFLOX) 0.3 % ophthalmic solution as directed.      . sildenafil (VIAGRA) 100 MG tablet Take 1 tablet (100 mg total) by mouth daily as needed.  6 tablet  prn  . [DISCONTINUED] amLODipine (NORVASC) 5 MG tablet Take 1 tablet (5 mg total) by mouth every morning.  90 tablet  3  . [DISCONTINUED] metoprolol succinate (TOPROL-XL) 25 MG 24 hr tablet Take 1 tablet (25 mg total) by mouth every morning.  90 tablet  3  . amLODipine (NORVASC) 5 MG tablet Take 1 tablet (5 mg total) by mouth every morning.  90 tablet  3  . metoprolol succinate (TOPROL-XL) 25 MG 24 hr tablet Take 1 tablet (25 mg total) by mouth every morning.  90 tablet  3  . zolpidem (AMBIEN) 5 MG tablet  Take 1 tablet (5 mg total) by mouth at bedtime as needed for sleep.  90 tablet  1  . [DISCONTINUED] warfarin (COUMADIN) 3 MG tablet Take 1 tablet (3 mg total) by mouth one time only at 6 PM.  35 tablet  0    Allergies  Allergen Reactions  . Lipitor (Atorvastatin Calcium)   . Zetia (Ezetimibe)     Patient Active Problem List  Diagnosis  . Hypothyroidism  . Benign hypertensive heart disease without heart failure  . Fatigue  . Dyslipidemia  . Anxiety and depression  . Macular degeneration of both eyes  . Osteoarthritis  . Osteoarthritis of right knee  . Altered mental status  . Acute blood loss anemia  . Hypokalemia  . Type II or unspecified type diabetes mellitus without mention of complication, uncontrolled    History  Smoking status  . Never Smoker   Smokeless tobacco  . Not on file    History  Alcohol Use  . 8.4 oz/week  . 14 Shots of liquor per  week    Family History  Problem Relation Age of Onset  . Hypertension Mother   . Arthritis Mother   . Diabetes Mother   . Hypertension Father   . Stroke Father   . Kidney disease Father   . Hypertension Brother   . Diabetes Brother     Review of Systems: Constitutional: no fever chills diaphoresis or fatigue or change in weight.  Head and neck: no hearing loss, no epistaxis, no photophobia or visual disturbance. Respiratory: No cough, shortness of breath or wheezing. Cardiovascular: No chest pain peripheral edema, palpitations. Gastrointestinal: No abdominal distention, no abdominal pain, no change in bowel habits hematochezia or melena. Genitourinary: No dysuria, no frequency, no urgency, no nocturia. Musculoskeletal:No arthralgias, no back pain, no gait disturbance or myalgias. Neurological: No dizziness, no headaches, no numbness, no seizures, no syncope, no weakness, no tremors. Hematologic: No lymphadenopathy, no easy bruising. Psychiatric: No confusion, no hallucinations, no sleep disturbance.    Physical Exam: Filed Vitals:   04/25/12 0955  BP: 130/70  Pulse: 87  Resp: 18   the general appearance reveals a well-developed well-nourished gentleman in no distress.The head and neck exam reveals pupils equal and reactive.  Extraocular movements are full.  There is no scleral icterus.  The mouth and pharynx are normal.  The neck is supple.  The carotids reveal no bruits.  The jugular venous pressure is normal.  The  thyroid is not enlarged.  There is no lymphadenopathy.  The chest is clear to percussion and auscultation.  There are no rales or rhonchi.  Expansion of the chest is symmetrical.  The precordium is quiet.  The first heart sound is normal.  The second heart sound is physiologically split.  There is no murmur gallop rub or click.  There is no abnormal lift or heave.  The abdomen is soft and nontender.  The bowel sounds are normal.  The liver and spleen are not enlarged.   There are no abdominal masses.  There are no abdominal bruits.  Extremities reveal good pedal pulses.  There is no phlebitis or edema.  There is no cyanosis or clubbing.  Strength is normal and symmetrical in all extremities.  There is no lateralizing weakness.  There are no sensory deficits.  The skin is warm and dry.  There is no rash.     Assessment / Plan: Kidney same medication.  Recheck in 4 months for followup office visit TSH free T4  A1c lipid panel hepatic function panel and basal metabolic panel

## 2012-04-28 ENCOUNTER — Telehealth: Payer: Self-pay | Admitting: Cardiology

## 2012-04-28 NOTE — Telephone Encounter (Signed)
New problme  Need to discuss  DM  Blood test .[facility] days ago. [facility] is asking for more information.

## 2012-04-28 NOTE — Telephone Encounter (Signed)
Spoke with patient earlier and advised form faxed over. He will call back if any problems

## 2012-05-11 DIAGNOSIS — H353290 Exudative age-related macular degeneration, unspecified eye, stage unspecified: Secondary | ICD-10-CM | POA: Diagnosis not present

## 2012-05-11 DIAGNOSIS — H35359 Cystoid macular degeneration, unspecified eye: Secondary | ICD-10-CM | POA: Diagnosis not present

## 2012-05-26 ENCOUNTER — Ambulatory Visit: Payer: Medicare Other

## 2012-05-26 ENCOUNTER — Ambulatory Visit (INDEPENDENT_AMBULATORY_CARE_PROVIDER_SITE_OTHER): Payer: Medicare Other | Admitting: Family Medicine

## 2012-05-26 VITALS — BP 152/82 | HR 65 | Temp 98.5°F | Resp 17 | Ht 72.0 in | Wt 161.0 lb

## 2012-05-26 DIAGNOSIS — R0602 Shortness of breath: Secondary | ICD-10-CM

## 2012-05-26 DIAGNOSIS — R05 Cough: Secondary | ICD-10-CM | POA: Diagnosis not present

## 2012-05-26 MED ORDER — AZITHROMYCIN 250 MG PO TABS: ORAL_TABLET | ORAL | Status: DC

## 2012-05-26 NOTE — Patient Instructions (Addendum)
Use the zpack if needed.  Consider trying some OTC claritin (loratadine).  If you are not better please let me know.  I am glad to send you to pulmonology if needed.

## 2012-05-26 NOTE — Progress Notes (Signed)
[facility] [address] [place] [address] [phone]  Date:  05/26/2012   Name:  Stephen Wilkinson   DOB:  July 25, 1928   MRN:  [id number]  PCP:  Cassell Clement, MD    Chief Complaint: Chest Injury   History of Present Illness:  Stephen Wilkinson is a 78 y.o. very pleasant male patient who presents with the following:  No past history of heart failure, he does have HTN. He is a pt of Dr. Patty Sermons.   Over the last couple of years he has had more frequent episodes of bronchitis.  He developed pneumonia once.   Taking mucinex does seem to help.  He was here twice this past fall- he was treated with amoxicillin and then a zpack and did get better  He has noted more frequent episodes of bronchitis for the last 2 years.  He has noted 2 or 3 episodes a year.  He notes chest congestion upon waking up in the morning.  Mucinex will help. He notes build up of chest congestion during the night  He is a non- smoker, never been a smoker.  He does know of any allergies, but wonders if this might be related.    No pedal edema.   He does have some orthopnea- however this could be congestion.  He is better laying on his side.   He had a low grade grade fever in October but none with this illness.   He noted the onset of symptoms again about 2 weeks ago.  He has some cough during the day.  However the main problem is that he will cough at night and in the am he needs to cough up mucus.   He did have some wheezing in October.  No wheezing this time.   He does have rhinorrhea.  No ST, no earache.     He feels a bit tired- could describe it as SOB.  It is not acute but does warrant checking a BNP  Patient Active Problem List  Diagnosis  . Hypothyroidism  . Benign hypertensive heart disease without heart failure  . Fatigue  . Dyslipidemia  . Anxiety and depression  . Macular degeneration of both eyes  . Osteoarthritis  . Osteoarthritis of right knee  . Altered mental status  .  Acute blood loss anemia  . Hypokalemia  . Type II or unspecified type diabetes mellitus without mention of complication, uncontrolled    Past Medical History  Diagnosis Date  . Hypertension   . Hyperlipidemia   . Diabetes mellitus   . Hypothyroidism   . Macular degeneration   . OA (osteoarthritis)   . Anxiety   . Depression   . PVC's (premature ventricular contractions)   . Hx: UTI (urinary tract infection)   . Insomnia   . Stroke     mid 1990s   . Pneumonia   . Chronic kidney disease     hx of uti due to catheter hx of prostatitis, BPH -   . Cancer     basal and squamous skin carcinoma   . Acute blood loss anemia 05/15/2011  . Cataract   . Glaucoma   . Blood transfusion without reported diagnosis     Past Surgical History  Procedure Date  . Replacement total knee   . Thyroidectomy   . Appendectomy   . Tonsillectomy and adenoidectomy   . Inguinal hernia repair   . US echocardiography 11/10/2006    EF 55-60%  .  US echocardiography 09/20/2002    EF 65-70%  . Other surgical history     microwave procedure for prostate - 09/12   . Total knee arthroplasty 05/11/2011    Procedure: TOTAL KNEE ARTHROPLASTY;  Surgeon: Loanne Drilling;  Location: [facility]S;  Service: Orthopedics;  Laterality: Right;    History  Substance Use Topics  . Smoking status: Never Smoker   . Smokeless tobacco: Not on file  . Alcohol Use: 4.2 oz/week    7 Shots of liquor per week    Family History  Problem Relation Age of Onset  . Hypertension Mother   . Arthritis Mother   . Diabetes Mother   . Hypertension Father   . Stroke Father   . Kidney disease Father   . Hypertension Brother   . Diabetes Brother     Allergies  Allergen Reactions  . Lipitor (Atorvastatin Calcium)   . Zetia (Ezetimibe)     Medication list has been reviewed and updated.  Current Outpatient Prescriptions on File Prior to Visit  Medication Sig Dispense Refill  . ALPRAZolam (XANAX) 0.25 MG tablet Take 1 tablet  (0.25 mg total) by mouth daily as needed.  90 tablet  1  . amLODipine (NORVASC) 5 MG tablet Take 1 tablet (5 mg total) by mouth every morning.  90 tablet  3  . aspirin 81 MG chewable tablet Chew 81 mg by mouth daily.      . Bevacizumab (AVASTIN IV) Place into the right eye as directed. Every 5 weeks      . clopidogrel (PLAVIX) 75 MG tablet Take 1 tablet (75 mg total) by mouth daily.  90 tablet  3  . digoxin (LANOXIN) 0.25 MG tablet Take 1-2 tablets (0.25-0.5 mg total) by mouth every morning. 1 tablet Monday, Tuesday, Thursday, Friday, and Saturday.. 2 tablet on Sunday and wednesday  125 tablet  3  . famotidine (PEPCID) 40 MG tablet Take 1 tablet (40 mg total) by mouth daily.  90 tablet  3  . latanoprost (XALATAN) 0.005 % ophthalmic solution Place 1 drop into both eyes at bedtime.       Marland Kitchen levothyroxine (SYNTHROID, LEVOTHROID) 100 MCG tablet Take 1-1.5 tablets (100-150 mcg total) by mouth every morning. 1 tablet on Sunday, Tuesday, Thursday, and Saturday.  1.5 tablet on Monday, Wednesday, and Friday.  130 tablet  3  . liothyronine (CYTOMEL) 25 MCG tablet Take 1 tablet (25 mcg total) by mouth every morning. 1 tablet 6 days a week. Pt does not take on sunday  90 tablet  3  . metFORMIN (GLUCOPHAGE) 1000 MG tablet Take 1 tablet (1,000 mg total) by mouth 2 (two) times daily.  180 tablet  3  . metoprolol succinate (TOPROL-XL) 25 MG 24 hr tablet Take 1 tablet (25 mg total) by mouth every morning.  90 tablet  3  . multivitamin-lutein (OCUVITE-LUTEIN) CAPS Take 1 capsule by mouth daily.      Marland Kitchen ofloxacin (OCUFLOX) 0.3 % ophthalmic solution as directed.      . sildenafil (VIAGRA) 100 MG tablet Take 1 tablet (100 mg total) by mouth daily as needed.  6 tablet  prn  . zolpidem (AMBIEN) 5 MG tablet Take 1 tablet (5 mg total) by mouth at bedtime as needed for sleep.  90 tablet  1  . amoxicillin (AMOXIL) 500 MG capsule 500 mg as needed. TAKE 4 CAPSULE  IN AM PRIOR TO DENTAL APPOINTMENT.      Marland Kitchen amoxicillin (AMOXIL) 875  MG tablet Take 1 tablet (  875 mg total) by mouth 2 (two) times daily.  20 tablet  0  . azithromycin (ZITHROMAX) 250 MG tablet Take 2 tabs PO x 1 dose, then 1 tab PO QD x 4 days  6 tablet  0  . [DISCONTINUED] warfarin (COUMADIN) 3 MG tablet Take 1 tablet (3 mg total) by mouth one time only at 6 PM.  35 tablet  0    Review of Systems:  As per HPI- otherwise negative.   Physical Examination: Filed Vitals:   05/26/12 1008  BP: 152/82  Pulse: 65  Temp: 98.5 F (36.9 C)  Resp: 17   Filed Vitals:   05/26/12 1008  Height: 6' (1.829 m)  Weight: 161 lb (73.029 kg)   Body mass index is 21.84 kg/(m^2). Ideal Body Weight: Weight in (lb) to have BMI = 25: 183.9   GEN: WDWN, NAD, Non-toxic, A & O x 3- spry and fit for age HEENT: Atraumatic, Normocephalic. Neck supple. No masses, No LAD. Bilateral TM wnl, oropharynx normal.  PEERL,EOMI.   Ears and Nose: No external deformity. CV: RRR, No M/G/R. No JVD. No thrill. No extra heart sounds. PULM: CTA B, no wheezes, crackles, rhonchi. No retractions. No resp. distress. No accessory muscle use. ABD: S, NT, ND EXTR: No c/c/e NEURO Normal gait.  PSYCH: Normally interactive. Conversant. Not depressed or anxious appearing.  Calm demeanor.   [facility] reading (PRIMARY) by  Dr. Patsy Lager. CXR: hyper expanded lungs- may be due to body habitus as he has never been a smoker.  Lung bases are cut off but no effusion seen.  No active disease  CHEST - 2 VIEW  Comparison: 03/21/2012 and preliminary reading of Dr. Patsy Lager  Findings: Cardiomediastinal silhouette is stable. No acute infiltrate or pulmonary edema. Mild hyperinflation again noted. Stable mild degenerative changes thoracic spine.  IMPRESSION: No active disease. Mild hyperinflation again noted.   Assessment and Plan: 1. Cough  DG Chest 2 View, azithromycin (ZITHROMAX) 250 MG tablet  2. SOB (shortness of breath)  Brain natriuretic peptide   Recurrent cough and congestion.  Stephen Wilkinson is concerned that  this may indicate a more persistent problem, but it could also be sporadic viral infections.  Will give him a zpack which he has used before with success.  He plans to hold it and use if not better.  Also can try claritin. Check BNP- normal, he was alerted to normal result.   Results for orders placed in visit on 05/26/12  BRAIN NATRIURETIC PEPTIDE      Component Value Range   Brain Natriuretic Peptide 34.4  0.0 - 100.0 pg/mL   He will let me know if not better.  In that case consider referral to pulmonology as he could have an element of COPD.   Meds ordered this encounter  Medications  . azithromycin (ZITHROMAX) 250 MG tablet    Sig: Use as a z pack    Dispense:  6 tablet    Refill:  0    Marjorie Lussier, MD

## 2012-06-21 DIAGNOSIS — H40019 Open angle with borderline findings, low risk, unspecified eye: Secondary | ICD-10-CM | POA: Diagnosis not present

## 2012-06-21 DIAGNOSIS — H40059 Ocular hypertension, unspecified eye: Secondary | ICD-10-CM | POA: Diagnosis not present

## 2012-06-22 DIAGNOSIS — H353290 Exudative age-related macular degeneration, unspecified eye, stage unspecified: Secondary | ICD-10-CM | POA: Diagnosis not present

## 2012-06-22 DIAGNOSIS — H3570 Unspecified separation of retinal layers: Secondary | ICD-10-CM | POA: Diagnosis not present

## 2012-08-04 DIAGNOSIS — H35059 Retinal neovascularization, unspecified, unspecified eye: Secondary | ICD-10-CM | POA: Diagnosis not present

## 2012-08-04 DIAGNOSIS — H353290 Exudative age-related macular degeneration, unspecified eye, stage unspecified: Secondary | ICD-10-CM | POA: Diagnosis not present

## 2012-08-25 ENCOUNTER — Other Ambulatory Visit (INDEPENDENT_AMBULATORY_CARE_PROVIDER_SITE_OTHER): Payer: Medicare Other

## 2012-08-25 DIAGNOSIS — E785 Hyperlipidemia, unspecified: Secondary | ICD-10-CM | POA: Diagnosis not present

## 2012-08-25 LAB — BASIC METABOLIC PANEL
BUN: 16 mg/dL (ref 6–23)
Calcium: 9.1 mg/dL (ref 8.4–10.5)
Creatinine, Ser: 1.1 mg/dL (ref 0.4–1.5)
GFR: 66.02 mL/min (ref >60.00)
Glucose, Bld: 128 mg/dL — ABNORMAL HIGH (ref 70–99)
Sodium: 139 mEq/L (ref 135–145)

## 2012-08-25 LAB — LIPID PANEL
Cholesterol: 165 mg/dL (ref 0–200)
HDL: 56.5 mg/dL (ref >39.00)
Triglycerides: 60 mg/dL (ref 0.0–149.0)
VLDL: 12 mg/dL (ref 0.0–40.0)

## 2012-08-25 LAB — TSH: TSH: 0.06 u[IU]/mL — ABNORMAL LOW (ref 0.35–5.50)

## 2012-08-25 LAB — HEMOGLOBIN A1C: Hgb A1c MFr Bld: 6.2 % (ref 4.6–6.5)

## 2012-08-25 LAB — HEPATIC FUNCTION PANEL: Albumin: 4.2 g/dL (ref 3.5–5.2)

## 2012-08-25 NOTE — Progress Notes (Signed)
Quick Note:  Please make copy of labs for patient visit. ______ 

## 2012-08-29 ENCOUNTER — Ambulatory Visit: Payer: Medicare Other | Admitting: Cardiology

## 2012-08-31 ENCOUNTER — Encounter: Payer: Self-pay | Admitting: Cardiology

## 2012-08-31 ENCOUNTER — Ambulatory Visit (INDEPENDENT_AMBULATORY_CARE_PROVIDER_SITE_OTHER): Payer: Medicare Other | Admitting: Cardiology

## 2012-08-31 VITALS — BP 142/72 | HR 60 | Ht 73.0 in | Wt 167.8 lb

## 2012-08-31 DIAGNOSIS — D62 Acute posthemorrhagic anemia: Secondary | ICD-10-CM

## 2012-08-31 DIAGNOSIS — G4700 Insomnia, unspecified: Secondary | ICD-10-CM

## 2012-08-31 DIAGNOSIS — K219 Gastro-esophageal reflux disease without esophagitis: Secondary | ICD-10-CM

## 2012-08-31 DIAGNOSIS — F419 Anxiety disorder, unspecified: Secondary | ICD-10-CM

## 2012-08-31 DIAGNOSIS — IMO0001 Reserved for inherently not codable concepts without codable children: Secondary | ICD-10-CM

## 2012-08-31 DIAGNOSIS — I119 Hypertensive heart disease without heart failure: Secondary | ICD-10-CM | POA: Diagnosis not present

## 2012-08-31 DIAGNOSIS — E785 Hyperlipidemia, unspecified: Secondary | ICD-10-CM

## 2012-08-31 DIAGNOSIS — E039 Hypothyroidism, unspecified: Secondary | ICD-10-CM

## 2012-08-31 DIAGNOSIS — R002 Palpitations: Secondary | ICD-10-CM

## 2012-08-31 MED ORDER — DIGOXIN 250 MCG PO TABS: ORAL_TABLET | ORAL | Status: DC

## 2012-08-31 MED ORDER — ALPRAZOLAM 0.25 MG PO TABS: 0.2500 mg | ORAL_TABLET | Freq: Every day | ORAL | Status: DC | PRN

## 2012-08-31 MED ORDER — ZOLPIDEM TARTRATE 5 MG PO TABS: 5.0000 mg | ORAL_TABLET | Freq: Every evening | ORAL | Status: DC | PRN

## 2012-08-31 MED ORDER — SILDENAFIL CITRATE 100 MG PO TABS: 100.0000 mg | ORAL_TABLET | Freq: Every day | ORAL | Status: DC | PRN

## 2012-08-31 MED ORDER — METFORMIN HCL 1000 MG PO TABS: ORAL_TABLET | ORAL | Status: DC

## 2012-08-31 MED ORDER — FAMOTIDINE 40 MG PO TABS: 40.0000 mg | ORAL_TABLET | Freq: Every day | ORAL | Status: DC

## 2012-08-31 MED ORDER — LEVOTHYROXINE SODIUM 100 MCG PO TABS: ORAL_TABLET | ORAL | Status: DC

## 2012-08-31 NOTE — Assessment & Plan Note (Signed)
His blood pressure was remaining stable on current therapy.  He is not having any symptoms of CHF.  No dizziness or syncope.

## 2012-08-31 NOTE — Patient Instructions (Addendum)
**Note De-Identified  Obfuscation** Your physician has recommended you make the following change in your medication: decrease Metformin to 1000 mg in the mornings and 500 mg in the evening  Your physician recommends that you return for lab work in: at next office visit  Your physician wants you to follow-up in: 4 months. You will receive a reminder letter in the mail two months in advance. If you Devario't receive a letter, please call our office to schedule the follow-up appointment.

## 2012-08-31 NOTE — Progress Notes (Signed)
Stephen Wilkinson Date of Birth:  26-Oct-1928 [facility] [address] [place], [place]  [address] [phone]         Fax   [phone]  History of Present Illness: This pleasant 79 year old gentleman is seen for a scheduled followup office visit. He has a history of diabetes and high blood pressure. He does not have any history of ischemic heart disease. He has had a past history of palpitations. He has hypothyroidism, BPH and osteoarthritis. He has had both of his knees replaced. Since his last visit he has had no new cardiac symptoms.   Current Outpatient Prescriptions  Medication Sig Dispense Refill  . ALPRAZolam (XANAX) 0.25 MG tablet Take 1 tablet (0.25 mg total) by mouth daily as needed.  90 tablet  1  . amLODipine (NORVASC) 5 MG tablet Take 1 tablet (5 mg total) by mouth every morning.  90 tablet  3  . aspirin 81 MG chewable tablet Chew 81 mg by mouth daily.      . Bevacizumab (AVASTIN IV) Place into the right eye as directed. Every 5 weeks      . clopidogrel (PLAVIX) 75 MG tablet Take 1 tablet (75 mg total) by mouth daily.  90 tablet  3  . digoxin (LANOXIN) 0.25 MG tablet 1 tablet Monday, Tuesday, Thursday, Friday, and Saturday.. 2 tablet on Sunday and wednesday  125 tablet  3  . famotidine (PEPCID) 40 MG tablet Take 1 tablet (40 mg total) by mouth daily.  90 tablet  3  . latanoprost (XALATAN) 0.005 % ophthalmic solution Place 1 drop into both eyes at bedtime.       Marland Kitchen levothyroxine (SYNTHROID, LEVOTHROID) 100 MCG tablet 1 tablet on Sunday, Tuesday, Thursday, and Saturday.  1 and 1/2 tablets on Monday, Wednesday, and Friday.  145 tablet  3  . liothyronine (CYTOMEL) 25 MCG tablet Take 1 tablet (25 mcg total) by mouth every morning. 1 tablet 6 days a week. Pt does not take on sunday  90 tablet  3  . metFORMIN (GLUCOPHAGE) 1000 MG tablet Take 1000 mg in the mornings and 500 mg in the evenings  180 tablet  3  . metoprolol succinate (TOPROL-XL) 25 MG 24 hr tablet Take 1  tablet (25 mg total) by mouth every morning.  90 tablet  3  . multivitamin-lutein (OCUVITE-LUTEIN) CAPS Take 1 capsule by mouth daily.      Marland Kitchen ofloxacin (OCUFLOX) 0.3 % ophthalmic solution as directed.      . sildenafil (VIAGRA) 100 MG tablet Take 1 tablet (100 mg total) by mouth daily as needed.  6 tablet  prn  . zolpidem (AMBIEN) 5 MG tablet Take 1 tablet (5 mg total) by mouth at bedtime as needed for sleep.  90 tablet  1  . [DISCONTINUED] warfarin (COUMADIN) 3 MG tablet Take 1 tablet (3 mg total) by mouth one time only at 6 PM.  35 tablet  0   No current facility-administered medications for this visit.    Allergies  Allergen Reactions  . Lipitor (Atorvastatin Calcium)   . Zetia (Ezetimibe)     Patient Active Problem List  Diagnosis  . Hypothyroidism  . Benign hypertensive heart disease without heart failure  . Fatigue  . Dyslipidemia  . Anxiety and depression  . Macular degeneration of both eyes  . Osteoarthritis  . Osteoarthritis of right knee  . Altered mental status  . Acute blood loss anemia  . Hypokalemia  . Type II or unspecified type diabetes mellitus  without mention of complication, uncontrolled    History  Smoking status  . Never Smoker   Smokeless tobacco  . Not on file    History  Alcohol Use  . 4.2 oz/week  . 7 Shots of liquor per week    Family History  Problem Relation Age of Onset  . Hypertension Mother   . Arthritis Mother   . Diabetes Mother   . Hypertension Father   . Stroke Father   . Kidney disease Father   . Hypertension Brother   . Diabetes Brother     Review of Systems: Constitutional: no fever chills diaphoresis or fatigue or change in weight.  Head and neck: no hearing loss, no epistaxis, no photophobia or visual disturbance. Respiratory: No cough, shortness of breath or wheezing. Cardiovascular: No chest pain peripheral edema, palpitations. Gastrointestinal: No abdominal distention, no abdominal pain, no change in bowel habits  hematochezia or melena. Genitourinary: No dysuria, no frequency, no urgency, no nocturia. Musculoskeletal:No arthralgias, no back pain, no gait disturbance or myalgias. Neurological: No dizziness, no headaches, no numbness, no seizures, no syncope, no weakness, no tremors. Hematologic: No lymphadenopathy, no easy bruising. Psychiatric: No confusion, no hallucinations, no sleep disturbance.    Physical Exam: Filed Vitals:   08/31/12 1526  BP: 142/72  Pulse: 60   the general appearance reveals a somewhat anxious elderly gentleman in no distress.The head and neck exam reveals pupils equal and reactive.  Extraocular movements are full.  There is no scleral icterus.  The mouth and pharynx are normal.  The neck is supple.  The carotids reveal no bruits.  The jugular venous pressure is normal.  The  thyroid is not enlarged.  There is no lymphadenopathy.  The chest is clear to percussion and auscultation.  There are no rales or rhonchi.  Expansion of the chest is symmetrical.  The precordium is quiet.  The first heart sound is normal.  The second heart sound is physiologically split.  There is no murmur gallop rub or click.  There is no abnormal lift or heave.  The abdomen is soft and nontender.  The bowel sounds are normal.  The liver and spleen are not enlarged.  There are no abdominal masses.  There are no abdominal bruits.  Extremities reveal good pedal pulses.  There is no phlebitis or edema.  There is no cyanosis or clubbing.  Strength is normal and symmetrical in all extremities.  There is no lateralizing weakness.  There are no sensory deficits.  The skin is warm and dry.  There is no rash.     Assessment / Plan: Continue same medication except reduced dose of metformin. the patient does not have a diabetologist.  He states he does not want to go on insulin. Recheck in 4 months for office visit EKG CBC A1c lipid panel hepatic function panel and basal metabolic panel

## 2012-08-31 NOTE — Assessment & Plan Note (Signed)
The patient's lipids are satisfactory on current therapy

## 2012-08-31 NOTE — Assessment & Plan Note (Signed)
The patient is not having any hypoglycemic episodes.  He is having symptoms which he ascribes to high-dose metformin in terms of dry eyes and dry mouth.  This is his chief complaint today.  He is having trouble sleeping because of his dry mouth.  We will reduce his metformin back down to 1000 in the morning and 500 at night.  If his symptoms continue we can consider cutting back even further.  Fortunately his A1c is 6.2.

## 2012-09-15 DIAGNOSIS — H35059 Retinal neovascularization, unspecified, unspecified eye: Secondary | ICD-10-CM | POA: Diagnosis not present

## 2012-09-15 DIAGNOSIS — H353290 Exudative age-related macular degeneration, unspecified eye, stage unspecified: Secondary | ICD-10-CM | POA: Diagnosis not present

## 2012-10-10 DIAGNOSIS — D485 Neoplasm of uncertain behavior of skin: Secondary | ICD-10-CM | POA: Diagnosis not present

## 2012-10-10 DIAGNOSIS — L578 Other skin changes due to chronic exposure to nonionizing radiation: Secondary | ICD-10-CM | POA: Diagnosis not present

## 2012-10-10 DIAGNOSIS — L819 Disorder of pigmentation, unspecified: Secondary | ICD-10-CM | POA: Diagnosis not present

## 2012-10-10 DIAGNOSIS — D230 Other benign neoplasm of skin of lip: Secondary | ICD-10-CM | POA: Diagnosis not present

## 2012-10-10 DIAGNOSIS — Z85828 Personal history of other malignant neoplasm of skin: Secondary | ICD-10-CM | POA: Diagnosis not present

## 2012-10-10 DIAGNOSIS — L570 Actinic keratosis: Secondary | ICD-10-CM | POA: Diagnosis not present

## 2012-10-10 DIAGNOSIS — D044 Carcinoma in situ of skin of scalp and neck: Secondary | ICD-10-CM | POA: Diagnosis not present

## 2012-10-27 DIAGNOSIS — H35359 Cystoid macular degeneration, unspecified eye: Secondary | ICD-10-CM | POA: Diagnosis not present

## 2012-10-27 DIAGNOSIS — H353290 Exudative age-related macular degeneration, unspecified eye, stage unspecified: Secondary | ICD-10-CM | POA: Diagnosis not present

## 2012-10-27 DIAGNOSIS — H353190 Nonexudative age-related macular degeneration, unspecified eye, stage unspecified: Secondary | ICD-10-CM | POA: Diagnosis not present

## 2012-10-28 ENCOUNTER — Ambulatory Visit (INDEPENDENT_AMBULATORY_CARE_PROVIDER_SITE_OTHER): Payer: Medicare Other | Admitting: Family Medicine

## 2012-10-28 ENCOUNTER — Ambulatory Visit: Payer: Medicare Other

## 2012-10-28 VITALS — BP 162/70 | HR 60 | Temp 98.1°F | Resp 16 | Ht 72.0 in | Wt 167.0 lb

## 2012-10-28 DIAGNOSIS — M25532 Pain in left wrist: Secondary | ICD-10-CM

## 2012-10-28 DIAGNOSIS — M79609 Pain in unspecified limb: Secondary | ICD-10-CM

## 2012-10-28 DIAGNOSIS — M79642 Pain in left hand: Secondary | ICD-10-CM

## 2012-10-28 DIAGNOSIS — M778 Other enthesopathies, not elsewhere classified: Secondary | ICD-10-CM

## 2012-10-28 DIAGNOSIS — M65839 Other synovitis and tenosynovitis, unspecified forearm: Secondary | ICD-10-CM | POA: Diagnosis not present

## 2012-10-28 DIAGNOSIS — M25539 Pain in unspecified wrist: Secondary | ICD-10-CM

## 2012-10-28 DIAGNOSIS — M65849 Other synovitis and tenosynovitis, unspecified hand: Secondary | ICD-10-CM | POA: Diagnosis not present

## 2012-10-28 NOTE — Progress Notes (Signed)
  Subjective:    Patient ID: Stephen Wilkinson, male    DOB: 1929-01-03, 79 y.o.   MRN: [id number]  Wrist Pain    79 y/o male c/o wrist pain, recently using shears to trim bushes on a ladder,  causing left  middle thumb pain to wrist,  x 2-3 weeks no numbness or tingling   Pain is intermittent   Used shears for hours on a ladder, macular degeneration. No burning, shooting intermittent  pain. Marland Kitchen  appt for after disney trip.  Has stopped trimming   No elbow pain.    No edema. Has not used ice or heat for treatment.   Reg doc  Dr. Clare Gandy?  Right handed     Review of Systems     Objective:   Physical Exam   Now pain with flexion or extension in wrist.   Pain with raised arms, no pain with extension in thumb. Derm appt. last week. No problem with movement of digits.         Assessment & Plan:

## 2012-10-28 NOTE — Patient Instructions (Addendum)
1.  ICE WRIST TWICE DAILY FOR 15-20 MINUTES FOR THE NEXT WEEK. 2.  REST WRIST FOR THE NEXT TWO WEEKS; NO LIFTING WITH L HAND.

## 2012-10-28 NOTE — Progress Notes (Signed)
Subjective:    Patient ID: Stephen Wilkinson, male    DOB: [birth year], 79 y.o.   MRN: [id number]  Wrist Pain  Pertinent negatives include no fever or numbness.   79 y/o male c/o wrist pain, recently using shears to trim bushes on a ladder,  causing left  middle thumb pain to wrist; pain x 2-3 weeks; no numbness or tingling   Pain is intermittent; no constant pain. Used shears for hours on a ladder approximately three weeks ago, +macular degeneration. No burning/numbness/tingling, +shooting intermittent  pain. Marland Kitchen  + disney trip in two weeks.  Has stopped trimming   No elbow pain.    No edema/swelling.  Has not used ice or heat for treatment.  No medication.  No previous injury to wrists/hands.  +OA hands. Right handed .  Appointment with ortho in two weeks.  PCP: [place]    Review of Systems  Constitutional: Negative for fever, chills, diaphoresis and fatigue.  Musculoskeletal: Positive for myalgias and arthralgias. Negative for joint swelling.  Skin: Negative for rash and wound.  Neurological: Negative for weakness and numbness.        Past Medical History  Diagnosis Date  . Hypertension   . Hyperlipidemia   . Diabetes mellitus   . Hypothyroidism   . Macular degeneration   . OA (osteoarthritis)   . Anxiety   . Depression   . PVC's (premature ventricular contractions)   . Hx: UTI (urinary tract infection)   . Insomnia   . Stroke     mid 1990s   . Pneumonia   . Chronic kidney disease     hx of uti due to catheter hx of prostatitis, BPH -   . Cancer     basal and squamous skin carcinoma   . Acute blood loss anemia 05/15/2011  . Cataract   . Glaucoma   . Blood transfusion without reported diagnosis     Past Surgical History  Procedure Laterality Date  . Replacement total knee    . Thyroidectomy    . Appendectomy    . Tonsillectomy and adenoidectomy    . Inguinal hernia repair    . US echocardiography  11/10/2006    EF 55-60%  . US echocardiography  09/20/2002    EF 65-70%   . Other surgical history      microwave procedure for prostate - 09/12   . Total knee arthroplasty  05/11/2011    Procedure: TOTAL KNEE ARTHROPLASTY;  Surgeon: Loanne Drilling;  Location: [facility];  Service: Orthopedics;  Laterality: Right;    Prior to Admission medications   Medication Sig Start Date End Date Taking? Authorizing Provider  ALPRAZolam (XANAX) 0.25 MG tablet Take 1 tablet (0.25 mg total) by mouth daily as needed. 08/31/12  Yes Cassell Clement, MD  amLODipine (NORVASC) 5 MG tablet Take 1 tablet (5 mg total) by mouth every morning. 04/25/12  Yes Cassell Clement, MD  aspirin 81 MG chewable tablet Chew 81 mg by mouth daily.   Yes Historical Provider, MD  Bevacizumab (AVASTIN IV) Place into the right eye as directed. Every 6 weeks   Yes Historical Provider, MD  clopidogrel (PLAVIX) 75 MG tablet Take 1 tablet (75 mg total) by mouth daily. 12/24/11  Yes Cassell Clement, MD  digoxin (LANOXIN) 0.25 MG tablet 1 tablet Monday, Tuesday, Thursday, Friday, and Saturday.. 2 tablet on Sunday and wednesday 08/31/12  Yes Cassell Clement, MD  famotidine (PEPCID) 40 MG tablet Take 1 tablet (40 mg total) by mouth daily. 08/31/12  Yes Cassell Clement, MD  latanoprost (XALATAN) 0.005 % ophthalmic solution Place 1 drop into both eyes at bedtime.    Yes Historical Provider, MD  levothyroxine (SYNTHROID, LEVOTHROID) 100 MCG tablet 1 tablet on Sunday, Tuesday, Thursday, and Saturday.  1 and 1/2 tablets on Monday, Wednesday, and Friday. 08/31/12  Yes Cassell Clement, MD  liothyronine (CYTOMEL) 25 MCG tablet Take 1 tablet (25 mcg total) by mouth every morning. 1 tablet 6 days a week. Pt does not take on sunday 12/24/11  Yes Cassell Clement, MD  metFORMIN (GLUCOPHAGE) 1000 MG tablet Take 1000 mg in the mornings and 500 mg in the evenings 08/31/12  Yes Cassell Clement, MD  metoprolol succinate (TOPROL-XL) 25 MG 24 hr tablet Take 1 tablet (25 mg total) by mouth every morning. 04/25/12  Yes Cassell Clement, MD   multivitamin-lutein [facility]) CAPS Take 1 capsule by mouth daily.   Yes Historical Provider, MD  ofloxacin (OCUFLOX) 0.3 % ophthalmic solution as directed. 10/07/11  Yes Historical Provider, MD  sildenafil (VIAGRA) 100 MG tablet Take 1 tablet (100 mg total) by mouth daily as needed. 08/31/12  Yes Cassell Clement, MD  zolpidem (AMBIEN) 5 MG tablet Take 1 tablet (5 mg total) by mouth at bedtime as needed for sleep. 08/31/12  Yes Cassell Clement, MD    Allergies  Allergen Reactions  . Lipitor (Atorvastatin Calcium)   . Zetia (Ezetimibe)     History   Social History  . Marital Status: Married    Spouse Name: N/A    Number of Children: N/A  . Years of Education: N/A   Occupational History  . Not on file.   Social History Main Topics  . Smoking status: Never Smoker   . Smokeless tobacco: Not on file  . Alcohol Use: 4.2 oz/week    7 Shots of liquor per week  . Drug Use: No  . Sexually Active: No   Other Topics Concern  . Not on file   Social History Narrative  . No narrative on file    Family History  Problem Relation Age of Onset  . Hypertension Mother   . Arthritis Mother   . Diabetes Mother   . Hypertension Father   . Stroke Father   . Kidney disease Father   . Hypertension Brother   . Diabetes Brother     Objective:   Physical Exam  Nursing note and vitals reviewed. Constitutional: He is oriented to person, place, and time. He appears well-developed and well-nourished. No distress.  Musculoskeletal:       Right wrist: He exhibits normal range of motion, no tenderness, no bony tenderness, no swelling and no effusion.       Left wrist: He exhibits normal range of motion, no tenderness, no bony tenderness, no swelling and no effusion.       Right hand: He exhibits normal range of motion, no tenderness, no bony tenderness and no swelling. Normal sensation noted. Normal strength noted.       Left hand: He exhibits normal range of motion, no tenderness, no  bony tenderness, no deformity and no swelling. Normal sensation noted. Normal strength noted.  Finkelstein's negative L and R hands.  Diffuse arthritic changes at IP joints; +decreased grip strength B due to OA.  Neurological: He is alert and oriented to person, place, and time.  Skin: He is not diaphoretic.  Psychiatric: He has a normal mood and affect. His behavior is normal.   [facility] reading (PRIMARY) by  Dr. Katrinka Blazing.  L  HAND:  DIFFUSE ARTHRITIS CHANGES AT IP JOINTS; NAD.  L WRIST:  NAD      Assessment & Plan:  Left hand pain - Plan: DG Hand 2 View Left  Left wrist pain - Plan: DG Wrist 2 Views Left  Tendonitis of wrist, left   1.  L hand/wrist pain:  New.  Secondary to overuse injury.  Avoid NSAIDs due to Plavix use. 2.  L wrist tendonitis:  New.  Thumb spica splint provided to wear during the day for the next week and then PRN. Avoid NSAIDs due to Plavix and pt desire.  Ice wrist bid for one week.  Passive range of motion encouraged.  No lifting L arm for one month.  If no improvement in two weeks, keep ortho appointment.

## 2012-11-02 ENCOUNTER — Telehealth: Payer: Self-pay | Admitting: Cardiology

## 2012-11-02 NOTE — Telephone Encounter (Signed)
Went to urgent care for pain in wrist and arm, was diagnosed with tendinitis. Advised  Dr. Patty Sermons out of the office but ok to take Tylenol as needed

## 2012-11-02 NOTE — Telephone Encounter (Signed)
New problem    Pt has pain in wrist and arm and wants to know if dr Patty Sermons can give him something for the pain

## 2012-12-05 DIAGNOSIS — H353290 Exudative age-related macular degeneration, unspecified eye, stage unspecified: Secondary | ICD-10-CM | POA: Diagnosis not present

## 2012-12-05 DIAGNOSIS — H35059 Retinal neovascularization, unspecified, unspecified eye: Secondary | ICD-10-CM | POA: Diagnosis not present

## 2012-12-20 DIAGNOSIS — H40059 Ocular hypertension, unspecified eye: Secondary | ICD-10-CM | POA: Diagnosis not present

## 2012-12-20 DIAGNOSIS — H40019 Open angle with borderline findings, low risk, unspecified eye: Secondary | ICD-10-CM | POA: Diagnosis not present

## 2012-12-20 DIAGNOSIS — H5210 Myopia, unspecified eye: Secondary | ICD-10-CM | POA: Diagnosis not present

## 2012-12-20 DIAGNOSIS — E119 Type 2 diabetes mellitus without complications: Secondary | ICD-10-CM | POA: Diagnosis not present

## 2012-12-28 ENCOUNTER — Ambulatory Visit (INDEPENDENT_AMBULATORY_CARE_PROVIDER_SITE_OTHER): Payer: Medicare Other | Admitting: Cardiology

## 2012-12-28 DIAGNOSIS — D62 Acute posthemorrhagic anemia: Secondary | ICD-10-CM

## 2012-12-28 DIAGNOSIS — IMO0001 Reserved for inherently not codable concepts without codable children: Secondary | ICD-10-CM

## 2012-12-28 DIAGNOSIS — I119 Hypertensive heart disease without heart failure: Secondary | ICD-10-CM | POA: Diagnosis not present

## 2012-12-28 DIAGNOSIS — E785 Hyperlipidemia, unspecified: Secondary | ICD-10-CM | POA: Diagnosis not present

## 2012-12-28 LAB — CBC WITH DIFFERENTIAL/PLATELET
Basophils Absolute: 0 10*3/uL (ref 0.0–0.1)
Basophils Relative: 0.3 % (ref 0.0–3.0)
Eosinophils Absolute: 0.1 10*3/uL (ref 0.0–0.7)
Eosinophils Relative: 2.2 % (ref 0.0–5.0)
HCT: 42.1 % (ref 39.0–52.0)
Hemoglobin: 14.4 g/dL (ref 13.0–17.0)
Lymphocytes Relative: 26.7 % (ref 12.0–46.0)
Lymphs Abs: 1.5 10*3/uL (ref 0.7–4.0)
MCHC: 34.1 g/dL (ref 30.0–36.0)
MCV: 95.8 fl (ref 78.0–100.0)
Monocytes Absolute: 0.6 10*3/uL (ref 0.1–1.0)
Monocytes Relative: 11.8 % (ref 3.0–12.0)
Neutro Abs: 3.1 10*3/uL (ref 1.4–7.7)
Neutrophils Relative %: 57.8 % (ref 43.0–77.0)
Platelets: 209 10*3/uL (ref 150.0–400.0)
RBC: 4.39 Mil/uL (ref 4.22–5.81)
RDW: 13.6 % (ref 11.5–14.6)
WBC: 5.3 10*3/uL (ref 4.5–10.5)
WBC: 5.2 10*3/uL (ref 4.5–10.5)

## 2012-12-28 LAB — BASIC METABOLIC PANEL
Chloride: 103 mEq/L (ref 96–112)
Potassium: 5.1 mEq/L (ref 3.5–5.1)

## 2012-12-28 LAB — HEMOGLOBIN A1C: Hgb A1c MFr Bld: 6.8 % — ABNORMAL HIGH (ref 4.6–6.5)

## 2012-12-28 LAB — LIPID PANEL
Cholesterol: 168 mg/dL (ref 0–200)
HDL: 54.9 mg/dL
LDL Cholesterol: 102 mg/dL — ABNORMAL HIGH (ref 0–99)
Total CHOL/HDL Ratio: 3
Triglycerides: 58 mg/dL (ref 0.0–149.0)
VLDL: 11.6 mg/dL (ref 0.0–40.0)

## 2012-12-28 LAB — HEPATIC FUNCTION PANEL
Albumin: 4.3 g/dL (ref 3.5–5.2)
Total Protein: 7.3 g/dL (ref 6.0–8.3)

## 2013-01-02 ENCOUNTER — Encounter: Payer: Self-pay | Admitting: Cardiology

## 2013-01-02 ENCOUNTER — Ambulatory Visit (INDEPENDENT_AMBULATORY_CARE_PROVIDER_SITE_OTHER): Payer: Medicare Other | Admitting: Cardiology

## 2013-01-02 DIAGNOSIS — E039 Hypothyroidism, unspecified: Secondary | ICD-10-CM

## 2013-01-02 DIAGNOSIS — E785 Hyperlipidemia, unspecified: Secondary | ICD-10-CM

## 2013-01-02 DIAGNOSIS — F411 Generalized anxiety disorder: Secondary | ICD-10-CM

## 2013-01-02 DIAGNOSIS — I119 Hypertensive heart disease without heart failure: Secondary | ICD-10-CM

## 2013-01-02 DIAGNOSIS — G4700 Insomnia, unspecified: Secondary | ICD-10-CM

## 2013-01-02 DIAGNOSIS — R002 Palpitations: Secondary | ICD-10-CM | POA: Diagnosis not present

## 2013-01-02 DIAGNOSIS — IMO0001 Reserved for inherently not codable concepts without codable children: Secondary | ICD-10-CM

## 2013-01-02 DIAGNOSIS — F419 Anxiety disorder, unspecified: Secondary | ICD-10-CM

## 2013-01-02 DIAGNOSIS — E119 Type 2 diabetes mellitus without complications: Secondary | ICD-10-CM

## 2013-01-02 MED ORDER — ALPRAZOLAM 0.25 MG PO TABS: 0.2500 mg | ORAL_TABLET | Freq: Every day | ORAL | Status: DC | PRN

## 2013-01-02 MED ORDER — CLOPIDOGREL BISULFATE 75 MG PO TABS: 75.0000 mg | ORAL_TABLET | Freq: Every day | ORAL | Status: DC

## 2013-01-02 MED ORDER — LEVOTHYROXINE SODIUM 100 MCG PO TABS: ORAL_TABLET | ORAL | Status: DC

## 2013-01-02 MED ORDER — AMLODIPINE BESYLATE 5 MG PO TABS: 5.0000 mg | ORAL_TABLET | ORAL | Status: DC

## 2013-01-02 MED ORDER — SILDENAFIL CITRATE 100 MG PO TABS: 100.0000 mg | ORAL_TABLET | Freq: Every day | ORAL | Status: DC | PRN

## 2013-01-02 MED ORDER — ZOLPIDEM TARTRATE 5 MG PO TABS: 5.0000 mg | ORAL_TABLET | Freq: Every evening | ORAL | Status: DC | PRN

## 2013-01-02 MED ORDER — DIGOXIN 250 MCG PO TABS: ORAL_TABLET | ORAL | Status: DC

## 2013-01-02 MED ORDER — METOPROLOL SUCCINATE ER 25 MG PO TB24: 25.0000 mg | ORAL_TABLET | ORAL | Status: DC

## 2013-01-02 MED ORDER — METFORMIN HCL 1000 MG PO TABS: ORAL_TABLET | ORAL | Status: DC

## 2013-01-02 MED ORDER — LIOTHYRONINE SODIUM 25 MCG PO TABS: 25.0000 ug | ORAL_TABLET | ORAL | Status: DC

## 2013-01-02 NOTE — Progress Notes (Signed)
Stephen Wilkinson Date of Birth:  11/05/1928 [facility] [address] [place], [place]  [address] [phone]         Fax   [phone]  History of Present Illness: This pleasant 79 year old gentleman is seen for a scheduled followup office visit. He has a history of diabetes and high blood pressure. He does not have any history of ischemic heart disease. He has had a past history of palpitations. He has hypothyroidism, BPH and osteoarthritis. He has had both of his knees replaced. Since his last visit he has had no new cardiac symptoms.  Since last visit he has noted increasing fatigue.  After he works in the yard about an hour he has to stop and rest.   Current Outpatient Prescriptions  Medication Sig Dispense Refill  . ALPRAZolam (XANAX) 0.25 MG tablet Take 1 tablet (0.25 mg total) by mouth daily as needed.  90 tablet  1  . amLODipine (NORVASC) 5 MG tablet Take 1 tablet (5 mg total) by mouth every morning.  90 tablet  3  . aspirin 81 MG chewable tablet Chew 81 mg by mouth daily.      . Bevacizumab (AVASTIN IV) Place into the right eye as directed. Every 6 weeks      . clopidogrel (PLAVIX) 75 MG tablet Take 1 tablet (75 mg total) by mouth daily.  90 tablet  3  . digoxin (LANOXIN) 0.25 MG tablet 1 tablet Monday, Tuesday, Thursday, Friday, and Saturday.. 2 tablet on Sunday and wednesday  125 tablet  3  . famotidine (PEPCID) 40 MG tablet Take 1 tablet (40 mg total) by mouth daily.  90 tablet  3  . latanoprost (XALATAN) 0.005 % ophthalmic solution Place 1 drop into both eyes at bedtime.       Marland Kitchen levothyroxine (SYNTHROID, LEVOTHROID) 100 MCG tablet 1 tablet on Sunday, Tuesday, Thursday, and Saturday.  1 and 1/2 tablets on Monday, Wednesday, and Friday.  145 tablet  3  . liothyronine (CYTOMEL) 25 MCG tablet Take 1 tablet (25 mcg total) by mouth every morning. 1 tablet 6 days a week. Pt does not take on sunday  90 tablet  3  . metFORMIN (GLUCOPHAGE) 1000 MG tablet Take 1000 mg daily   90 tablet  3  . metoprolol succinate (TOPROL-XL) 25 MG 24 hr tablet Take 1 tablet (25 mg total) by mouth every morning.  90 tablet  3  . multivitamin-lutein (OCUVITE-LUTEIN) CAPS Take 1 capsule by mouth daily.      Marland Kitchen ofloxacin (OCUFLOX) 0.3 % ophthalmic solution as directed.      . sildenafil (VIAGRA) 100 MG tablet Take 1 tablet (100 mg total) by mouth daily as needed.  6 tablet  prn  . zolpidem (AMBIEN) 5 MG tablet Take 1 tablet (5 mg total) by mouth at bedtime as needed for sleep.  90 tablet  1  . [DISCONTINUED] warfarin (COUMADIN) 3 MG tablet Take 1 tablet (3 mg total) by mouth one time only at 6 PM.  35 tablet  0   No current facility-administered medications for this visit.    Allergies  Allergen Reactions  . Lipitor (Atorvastatin Calcium)   . Zetia (Ezetimibe)     Patient Active Problem List   Diagnosis Date Noted  . Type II or unspecified type diabetes mellitus without mention of complication, uncontrolled 12/24/2011  . Altered mental status 05/15/2011  . Acute blood loss anemia 05/15/2011  . Hypokalemia 05/15/2011  . Osteoarthritis of right knee 05/12/2011  . Hypothyroidism  12/25/2010  . Benign hypertensive heart disease without heart failure 12/25/2010  . Fatigue 12/25/2010  . Dyslipidemia 12/25/2010  . Anxiety and depression 12/25/2010  . Macular degeneration of both eyes 12/25/2010  . Osteoarthritis 12/25/2010    History  Smoking status  . Never Smoker   Smokeless tobacco  . Not on file    History  Alcohol Use  . 4.2 oz/week  . 7 Shots of liquor per week    Family History  Problem Relation Age of Onset  . Hypertension Mother   . Arthritis Mother   . Diabetes Mother   . Hypertension Father   . Stroke Father   . Kidney disease Father   . Hypertension Brother   . Diabetes Brother     Review of Systems: Constitutional: no fever chills diaphoresis or fatigue or change in weight.  Head and neck: no hearing loss, no epistaxis, no photophobia or visual  disturbance. Respiratory: No cough, shortness of breath or wheezing. Cardiovascular: No chest pain peripheral edema, palpitations. Gastrointestinal: No abdominal distention, no abdominal pain, no change in bowel habits hematochezia or melena. Genitourinary: No dysuria, no frequency, no urgency, no nocturia. Musculoskeletal:No arthralgias, no back pain, no gait disturbance or myalgias. Neurological: No dizziness, no headaches, no numbness, no seizures, no syncope, no weakness, no tremors. Hematologic: No lymphadenopathy, no easy bruising. Psychiatric: No confusion, no hallucinations, no sleep disturbance.    Physical Exam: There were no vitals filed for this visit. The general appearance reveals a well-developed well-nourished gentleman in no distress.The head and neck exam reveals pupils equal and reactive.  Extraocular movements are full.  There is no scleral icterus.  The mouth and pharynx are normal.  The neck is supple.  The carotids reveal no bruits.  The jugular venous pressure is normal.  The  thyroid is not enlarged.  There is no lymphadenopathy.  The chest is clear to percussion and auscultation.  There are no rales or rhonchi.  Expansion of the chest is symmetrical.  The precordium is quiet.  The first heart sound is normal.  The second heart sound is physiologically split.  There is no murmur gallop rub or click.  There is no abnormal lift or heave.  The abdomen is soft and nontender.  The bowel sounds are normal.  The liver and spleen are not enlarged.  There are no abdominal masses.  There are no abdominal bruits.  Extremities reveal good pedal pulses.  There is no phlebitis or edema.  There is no cyanosis or clubbing.  Strength is normal and symmetrical in all extremities.  There is no lateralizing weakness.  There are no sensory deficits.  The skin is warm and dry.  There is no rash.  EKG today shows sinus bradycardia with first degree AV block and occasional PACs and is unchanged from  12/24/11  Assessment / Plan: Continue same medication.  Continue metformin 1000 mg daily.  Needs to be more strict with his diet regarding his diabetes. Recheck in 4 months for office visit lipid panel hepatic function panel basal metabolic panel A1c CBC TSH and free T4

## 2013-01-02 NOTE — Assessment & Plan Note (Signed)
The patient is remaining clinically euthyroid on current therapy.  We will plan to check thyroid functions at his next visit

## 2013-01-02 NOTE — Assessment & Plan Note (Signed)
The patient is not able to tolerate any more than 1000 mg of metformin daily.  His hemoglobin A1c is elevated at 6.8.  He has not been as careful with his diet he intends to do better.  He does have some mild peripheral neuropathy with numbness on the bottom of his feet.  He does have good pedal pulses and no claudication

## 2013-01-02 NOTE — Patient Instructions (Addendum)
Your physician wants you to follow-up in: 4 monthsYou will receive a reminder letter in the mail two months in advance. If you Zeric't receive a letter, please call our office to schedule the follow-up appointment.  Your physician recommends that you return for lab work in: 4 months bmet lp hfp a1c tsh f t4

## 2013-01-02 NOTE — Assessment & Plan Note (Signed)
Blood pressure has been remaining stable on his current therapy which includes amlodipine.  He does experience some dry mouth at night when he sleeps on his back in which he attributed to his blood pressure medicines.  However it is probably related to sleeping on his back with his mouth open.

## 2013-01-04 ENCOUNTER — Encounter: Payer: Self-pay | Admitting: Family Medicine

## 2013-01-04 ENCOUNTER — Ambulatory Visit (INDEPENDENT_AMBULATORY_CARE_PROVIDER_SITE_OTHER): Payer: Medicare Other | Admitting: Family Medicine

## 2013-01-04 VITALS — BP 120/80 | Temp 98.1°F | Ht 73.5 in | Wt 167.0 lb

## 2013-01-04 DIAGNOSIS — N400 Enlarged prostate without lower urinary tract symptoms: Secondary | ICD-10-CM | POA: Diagnosis not present

## 2013-01-04 DIAGNOSIS — R002 Palpitations: Secondary | ICD-10-CM

## 2013-01-04 DIAGNOSIS — E1142 Type 2 diabetes mellitus with diabetic polyneuropathy: Secondary | ICD-10-CM | POA: Insufficient documentation

## 2013-01-04 DIAGNOSIS — Z23 Encounter for immunization: Secondary | ICD-10-CM

## 2013-01-04 DIAGNOSIS — Z8673 Personal history of transient ischemic attack (TIA), and cerebral infarction without residual deficits: Secondary | ICD-10-CM

## 2013-01-04 DIAGNOSIS — E785 Hyperlipidemia, unspecified: Secondary | ICD-10-CM

## 2013-01-04 DIAGNOSIS — E1149 Type 2 diabetes mellitus with other diabetic neurological complication: Secondary | ICD-10-CM

## 2013-01-04 DIAGNOSIS — I440 Atrioventricular block, first degree: Secondary | ICD-10-CM

## 2013-01-04 DIAGNOSIS — H409 Unspecified glaucoma: Secondary | ICD-10-CM

## 2013-01-04 DIAGNOSIS — H3530 Unspecified macular degeneration: Secondary | ICD-10-CM

## 2013-01-04 DIAGNOSIS — E039 Hypothyroidism, unspecified: Secondary | ICD-10-CM

## 2013-01-04 HISTORY — DX: Unspecified glaucoma: H40.9

## 2013-01-04 HISTORY — DX: Personal history of transient ischemic attack (TIA), and cerebral infarction without residual deficits: Z86.73

## 2013-01-04 LAB — MICROALBUMIN / CREATININE URINE RATIO: Creatinine,U: 158.4 mg/dL

## 2013-01-04 NOTE — Progress Notes (Addendum)
Chief Complaint  Patient presents with  . Establish Care    HPI:  Stephen Wilkinson is here to establish care. He has been followed closely by his cardiologist Dr. Patty Sermons whom he saw recently with labs done on 7/16 - lipids and HgbA1c ok. TSH not checked at the time.  Dr. Ermalene Postin - urology, BPH, s/p prostate surgery Dr. Lequita Halt  - ortho, s/p knee replacement, OA Dr. Luciana Axe- optho for macular degeneration   Has the following chronic problems and concerns today:  Tendonitis L thumb: -reports seen for this -started after clipping hedges -intermittent pain in L thumb -saw Dr. Tiburcio Pea for this and had xrays - told tendonitis -feels ok now, tylenol helps  Anxiety -has been through several divorces -has struggled with current wife as well in terms of relationship and stresses abut this -worries a lot a bout a lot of things -Dr. Patty Sermons has prescribed Remus Loffler and xanax for him nightly to help with sleep and he has taken these yearly  HTN/hx TIA/: -managed by Dr. 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 Luciana Axe  DM: -on metformin 1000mg  daily Lab Results  Component Value Date   HGBA1C 6.8* 12/28/2012  -exercises/diet: walks 30 minutes daily; diet is ok but does like carbs -followed closely by optho     Patient Active Problem List   Diagnosis Date Noted  . First degree heart block 01/04/2013  . DM type 2 with diabetic peripheral neuropathy 01/04/2013  . BPH (benign prostatic hyperplasia) 01/04/2013  . Hx of transient ischemic attack (TIA) 01/04/2013  . Palpitations 01/04/2013  . Glaucoma 01/04/2013  . Osteoarthritis of  right knee 05/12/2011  . Hypothyroidism 12/25/2010  . Benign hypertensive heart disease without heart failure 12/25/2010  . Dyslipidemia 12/25/2010  . Anxiety and depression 12/25/2010  . Macular degeneration of both eyes 12/25/2010    Health Maintenance:  ROS: See pertinent positives and negatives per HPI.  Past Medical History  Diagnosis Date  . Hypertension   . Hyperlipidemia   . Diabetes mellitus   . Hypothyroidism   . Macular degeneration   . OA (osteoarthritis)   . Anxiety   . Depression   . PVC's (premature ventricular contractions)   . Hx: UTI (urinary tract infection)   . Insomnia   . Stroke     mid 1990s   . Pneumonia   . Chronic kidney disease     hx of uti due to catheter hx of prostatitis, BPH -   . Cancer     basal and squamous skin carcinoma   . Cataract   . Glaucoma   . Blood transfusion without reported diagnosis   . Altered mental status 05/15/2011  . Hypokalemia 05/15/2011  . Acute blood loss anemia 05/15/2011    post surgical  . Glaucoma 01/04/2013  . Hx of transient ischemic attack (TIA) 01/04/2013    Family History  Problem Relation Age of Onset  . Hypertension Mother   . Arthritis Mother   . Diabetes Mother   . Hypertension Father   . Stroke Father   . Kidney disease Father   . Hypertension Brother   .  Diabetes Brother     History   Social History  . Marital Status: Married    Spouse Name: N/A    Number of Children: N/A  . Years of Education: N/A   Social History Main Topics  . Smoking status: Never Smoker   . Smokeless tobacco: None  . Alcohol Use: 4.2 oz/week    7 Shots of liquor per week     Comment: 2 drinks most days  . Drug Use: No  . Sexually Active: No   Other Topics Concern  . 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 Every 6 weeks, Disp: , Rfl:  clopidogrel (PLAVIX) 75 MG tablet, Take 1 tablet (75 mg total) by mouth daily., Disp: 90 tablet, Rfl: 3;  digoxin (LANOXIN) 0.25 MG tablet, 1 tablet Monday, Tuesday, Thursday, Friday, and Saturday.. 2 tablet on Sunday and wednesday, Disp: 125 tablet, Rfl: 3;  famotidine (PEPCID) 40 MG tablet, Take 1 tablet (40 mg total) by mouth daily., Disp: 90 tablet, Rfl: 3 latanoprost (XALATAN) 0.005 % ophthalmic solution, Place 1 drop into both eyes at bedtime. , Disp: , Rfl: ;  levothyroxine (SYNTHROID, LEVOTHROID) 100 MCG tablet, 1 tablet on Sunday, Tuesday, Thursday, and Saturday.  1 and 1/2 tablets on Monday, Wednesday, and Friday., Disp: 145 tablet, Rfl: 3 liothyronine (CYTOMEL) 25 MCG tablet, Take 1 tablet (25 mcg total) by mouth every morning. 1 tablet 6 days a week. Pt does not take on sunday, Disp: 90 tablet, Rfl: 3;  metFORMIN (GLUCOPHAGE) 1000 MG tablet, Take 1000 mg daily, Disp: 90 tablet, Rfl: 3;  metoprolol succinate (TOPROL-XL) 25 MG 24 hr tablet, Take 1 tablet (25 mg total) by mouth every morning., Disp: 90 tablet, Rfl: 3 multivitamin-lutein (OCUVITE-LUTEIN) CAPS, Take 1 capsule by mouth daily., Disp: , Rfl: ;  ofloxacin (OCUFLOX) 0.3 % ophthalmic solution, as directed., Disp: , Rfl: ;  sildenafil (VIAGRA) 100 MG tablet, Take 1 tablet (100 mg total) by mouth daily as needed., Disp: 6 tablet, Rfl: prn;  zolpidem (AMBIEN) 5 MG tablet, Take 1 tablet (5 mg total) by mouth at bedtime as needed for sleep., Disp: 90 tablet, Rfl: 1 [DISCONTINUED] warfarin (COUMADIN) 3 MG tablet, Take 1  tablet (3 mg total) by mouth one time only at 6 PM., Disp: 35 tablet, Rfl: 0  EXAM:  Filed Vitals:   01/04/13 1058  BP: 120/80  Temp: 98.1 F (36.7 C)    Body mass index is 21.73 kg/(m^2).  GENERAL: vitals reviewed and listed above, alert, oriented, appears well hydrated and in no acute distress  HEENT: atraumatic, conjunttiva clear, no obvious abnormalities on inspection of external nose and ears  NECK: no obvious masses on inspection  LUNGS: clear to auscultation bilaterally, no wheezes, rales or rhonchi, good air movement  CV: HRRR,  no peripheral edema  MS: moves all extremities without noticeable abnormality  PSYCH: pleasant and cooperative, no obvious depression or anxiety  ASSESSMENT AND PLAN:  Discussed the following assessment and plan:  First degree heart block  DM type 2 with diabetic peripheral neuropathy - Plan: Ambulatory referral to Podiatry, Microalbumin/Creatinine Ratio, Urine -foot exam today, referral to podiatry -microal/cr -discuss lifestyle, goals, hypoglycemia -followed closely by optho  BPH (benign prostatic hyperplasia)  Hx of transient ischemic attack (TIA)  Palpitations  Hypothyroidism  Dyslipidemia  Macular degeneration of both eyes  Glaucoma  Need for prophylactic vaccination with combined diphtheria-tetanus-pertussis (DTP) vaccine - Plan: Tdap vaccine greater than or equal to 7yo IM  -We reviewed the PMH, PSH, FH, SH, Meds and Allergies. -We provided refills for any medications we will prescribe as needed. -We addressed current concerns per orders and patient instructions. -We have asked for records for pertinent exams, studies, vaccines and notes from previous providers. -We have advised patient to follow up per instructions below. -tdap today -stopped getting sigmoids at age 24 and does not want to do further colon cancer screening -follow up with Dr. Patty Sermons for heart and anxiety issues -follow 4-6 months  -Patient  advised to return or notify a doctor immediately if symptoms worsen or persist or new concerns arise.  Patient Instructions  -PLEASE SIGN UP FOR MYCHART TODAY   We recommend the following healthy lifestyle measures: - eat a healthy diet consisting of lots of vegetables, fruits, beans, nuts, seeds, healthy meats such as white chicken and fish and whole grains.  - avoid fried foods, fast food, processed foods, sodas, red meet and other fattening foods.  - get a least 150 minutes of aerobic exercise per week.   Consider getting a personal trainer  Will defer to Dr. Patty Sermons for your heart and anxiety medications  We placed a referral for you as discussed to a podiatrist. It usually takes about 1-2 weeks to process and schedule this referral. If you have not heard from [place] regarding this appointment in 2 weeks please contact our office.  Follow up in: 4-6 months      Nazaria Riesen R.

## 2013-01-04 NOTE — Addendum Note (Signed)
Addended by: Terressa Koyanagi on: 01/04/2013 12:56 PM   Modules accepted: Orders

## 2013-01-04 NOTE — Addendum Note (Signed)
Addended by: Azucena Freed on: 01/04/2013 12:08 PM   Modules accepted: Orders

## 2013-01-04 NOTE — Patient Instructions (Addendum)
-  PLEASE SIGN UP FOR MYCHART TODAY   We recommend the following healthy lifestyle measures: - eat a healthy diet consisting of lots of vegetables, fruits, beans, nuts, seeds, healthy meats such as white chicken and fish and whole grains.  - avoid fried foods, fast food, processed foods, sodas, red meet and other fattening foods.  - get a least 150 minutes of aerobic exercise per week.   Consider getting a personal trainer  Will defer to Dr. Patty Sermons for your heart and anxiety medications  We placed a referral for you as discussed to a podiatrist. It usually takes about 1-2 weeks to process and schedule this referral. If you have not heard from [place] regarding this appointment in 2 weeks please contact our office.  Follow up in: 4-6 months

## 2013-01-05 NOTE — Progress Notes (Signed)
Quick Note:  Left a detailed message for pt at designated home number. ______

## 2013-01-11 DIAGNOSIS — H353290 Exudative age-related macular degeneration, unspecified eye, stage unspecified: Secondary | ICD-10-CM | POA: Diagnosis not present

## 2013-01-11 DIAGNOSIS — H35059 Retinal neovascularization, unspecified, unspecified eye: Secondary | ICD-10-CM | POA: Diagnosis not present

## 2013-01-20 DIAGNOSIS — D485 Neoplasm of uncertain behavior of skin: Secondary | ICD-10-CM | POA: Diagnosis not present

## 2013-01-20 DIAGNOSIS — D0460 Carcinoma in situ of skin of unspecified upper limb, including shoulder: Secondary | ICD-10-CM | POA: Diagnosis not present

## 2013-01-20 DIAGNOSIS — L821 Other seborrheic keratosis: Secondary | ICD-10-CM | POA: Diagnosis not present

## 2013-01-20 DIAGNOSIS — Z85828 Personal history of other malignant neoplasm of skin: Secondary | ICD-10-CM | POA: Diagnosis not present

## 2013-01-20 DIAGNOSIS — L570 Actinic keratosis: Secondary | ICD-10-CM | POA: Diagnosis not present

## 2013-01-20 DIAGNOSIS — D045 Carcinoma in situ of skin of trunk: Secondary | ICD-10-CM | POA: Diagnosis not present

## 2013-01-20 DIAGNOSIS — D235 Other benign neoplasm of skin of trunk: Secondary | ICD-10-CM | POA: Diagnosis not present

## 2013-01-20 DIAGNOSIS — D239 Other benign neoplasm of skin, unspecified: Secondary | ICD-10-CM | POA: Diagnosis not present

## 2013-01-25 DIAGNOSIS — L608 Other nail disorders: Secondary | ICD-10-CM | POA: Diagnosis not present

## 2013-01-25 DIAGNOSIS — E1149 Type 2 diabetes mellitus with other diabetic neurological complication: Secondary | ICD-10-CM | POA: Diagnosis not present

## 2013-01-25 DIAGNOSIS — Q828 Other specified congenital malformations of skin: Secondary | ICD-10-CM | POA: Diagnosis not present

## 2013-02-01 ENCOUNTER — Ambulatory Visit (INDEPENDENT_AMBULATORY_CARE_PROVIDER_SITE_OTHER): Payer: Medicare Other | Admitting: Family Medicine

## 2013-02-01 VITALS — BP 124/80 | HR 66 | Temp 98.1°F | Resp 16 | Ht 71.5 in | Wt 167.0 lb

## 2013-02-01 DIAGNOSIS — J209 Acute bronchitis, unspecified: Secondary | ICD-10-CM | POA: Diagnosis not present

## 2013-02-01 DIAGNOSIS — R059 Cough, unspecified: Secondary | ICD-10-CM

## 2013-02-01 DIAGNOSIS — R05 Cough: Secondary | ICD-10-CM | POA: Diagnosis not present

## 2013-02-01 MED ORDER — BENZONATATE 100 MG PO CAPS: 200.0000 mg | ORAL_CAPSULE | Freq: Two times a day (BID) | ORAL | Status: DC | PRN

## 2013-02-01 MED ORDER — AZITHROMYCIN 250 MG PO TABS: ORAL_TABLET | ORAL | Status: DC

## 2013-02-01 NOTE — Patient Instructions (Addendum)

## 2013-02-01 NOTE — Progress Notes (Signed)
[facility]:  Office Visit  Chief Complaint:  Chief Complaint  Patient presents with  . Cough    dry cough wheezing some PND x 3-4 weeks    HPI: Stephen Wilkinson is an 79 y.o. male who complains of  1 month history of cough, has allergies and PND . He was sidetracked by some other things and has not been able to get in to see a doctor. He is tired. He is wheezing with heavy coughing but does not want albuterol. The PND is mostly at night. He was given amoxacillin but not completely resolved. He has taken z packs before He does have a history of glaucoma and macular degeneration  He has not taken otc antihistamines  Past Medical History  Diagnosis Date  . Hypertension   . Hyperlipidemia   . Diabetes mellitus   . Hypothyroidism   . Macular degeneration   . OA (osteoarthritis)   . Anxiety   . Depression   . PVC's (premature ventricular contractions)   . Hx: UTI (urinary tract infection)   . Insomnia   . Stroke     mid 1990s   . Pneumonia   . Chronic kidney disease     hx of uti due to catheter hx of prostatitis, BPH -   . Cancer     basal and squamous skin carcinoma   . Cataract   . Glaucoma   . Blood transfusion without reported diagnosis   . Altered mental status 05/15/2011  . Hypokalemia 05/15/2011  . Acute blood loss anemia 05/15/2011    post surgical  . Glaucoma 01/04/2013  . Hx of transient ischemic attack (TIA) 01/04/2013   Past Surgical History  Procedure Laterality Date  . Replacement total knee    . Thyroidectomy    . Appendectomy    . Tonsillectomy and adenoidectomy    . Inguinal hernia repair    . US echocardiography  11/10/2006    EF 55-60%  . US echocardiography  09/20/2002    EF 65-70%  . Other surgical history      microwave procedure for prostate - 09/12   . Total knee arthroplasty  05/11/2011    Procedure: TOTAL KNEE ARTHROPLASTY;  Surgeon: Loanne Drilling;  Location: [facility];  Service: Orthopedics;  Laterality: Right;   History    Social History  . Marital Status: Married    Spouse Name: N/A    Number of Children: N/A  . Years of Education: N/A   Social History Main Topics  . Smoking status: Never Smoker   . Smokeless tobacco: Not on file  . Alcohol Use: 4.2 oz/week    7 Shots of liquor per week     Comment: 2 drinks most days  . Drug Use: No  . Sexual Activity: No   Other Topics Concern  . 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 Hypertension Mother   . Arthritis Mother   . Diabetes Mother   . Hypertension Father   . Stroke Father   . Kidney disease Father   . Hypertension Brother   . Diabetes Brother  Allergies  Allergen Reactions  . Lipitor [Atorvastatin Calcium]     NOT and allergy  . Zetia [Ezetimibe]     NOT an allergy   Prior to Admission medications   Medication Sig Start Date End Date Taking? Authorizing Provider  ALPRAZolam (XANAX) 0.25 MG tablet Take 1 tablet (0.25 mg total) by mouth daily as needed. 01/02/13  Yes Cassell Clement, MD  amLODipine (NORVASC) 5 MG tablet Take 1 tablet (5 mg total) by mouth every morning. 01/02/13  Yes Cassell Clement, MD  aspirin 81 MG chewable tablet Chew 81 mg by mouth daily.   Yes Historical Provider, MD  Bevacizumab (AVASTIN IV) Place into the right eye as directed. Every 6 weeks   Yes Historical Provider, MD  clopidogrel (PLAVIX) 75 MG tablet Take 1 tablet (75 mg total) by mouth daily. 01/02/13  Yes Cassell Clement, MD  digoxin (LANOXIN) 0.25 MG tablet 1 tablet Monday, Tuesday, Thursday, Friday, and Saturday.. 2 tablet on Sunday and wednesday 01/02/13  Yes Cassell Clement, MD  famotidine (PEPCID) 40 MG tablet Take 1 tablet (40 mg total) by mouth daily. 08/31/12  Yes Cassell Clement, MD  latanoprost  (XALATAN) 0.005 % ophthalmic solution Place 1 drop into both eyes at bedtime.    Yes Historical Provider, MD  levothyroxine (SYNTHROID, LEVOTHROID) 100 MCG tablet 1 tablet on Sunday, Tuesday, Thursday, and Saturday.  1 and 1/2 tablets on Monday, Wednesday, and Friday. 01/02/13  Yes Cassell Clement, MD  liothyronine (CYTOMEL) 25 MCG tablet Take 1 tablet (25 mcg total) by mouth every morning. 1 tablet 6 days a week. Pt does not take on sunday 01/02/13  Yes Cassell Clement, MD  metFORMIN (GLUCOPHAGE) 1000 MG tablet Take 1000 mg daily 01/02/13  Yes Cassell Clement, MD  metoprolol succinate (TOPROL-XL) 25 MG 24 hr tablet Take 1 tablet (25 mg total) by mouth every morning. 01/02/13  Yes Cassell Clement, MD  multivitamin-lutein [facility]) CAPS Take 1 capsule by mouth daily.   Yes Historical Provider, MD  ofloxacin (OCUFLOX) 0.3 % ophthalmic solution as directed. 10/07/11  Yes Historical Provider, MD  sildenafil (VIAGRA) 100 MG tablet Take 1 tablet (100 mg total) by mouth daily as needed. 01/02/13  Yes Cassell Clement, MD  zolpidem (AMBIEN) 5 MG tablet Take 1 tablet (5 mg total) by mouth at bedtime as needed for sleep. 01/02/13  Yes Cassell Clement, MD     ROS: The patient denies fevers, chills, night sweats, unintentional weight loss, chest pain, palpitations, dyspnea on exertion, nausea, vomiting, abdominal pain, dysuria, hematuria, melena, numbness, weakness, or tingling.   All other systems have been reviewed and were otherwise negative with the exception of those mentioned in the HPI and as above.    PHYSICAL EXAM: Filed Vitals:   02/01/13 0927  BP: 124/80  Pulse: 66  Temp: 98.1 F (36.7 C)  Resp: 16   Filed Vitals:   02/01/13 0927  Height: 5' 11.5" (1.816 m)  Weight: 167 lb (75.751 kg)   Body mass index is 22.97 kg/(m^2).  General: Alert, no acute distress HEENT:  Normocephalic, atraumatic, oropharynx patent. EOMI, PERRLA.  Tm nl. + boggy nares, erythematous throat, +  PND Cardiovascular:  Regular rate and rhythm, no rubs murmurs or gallops.  No Carotid bruits, radial pulse intact. No pedal edema.  Respiratory: Clear to auscultation bilaterally.  No wheezes, rales, or rhonchi.  No cyanosis, no use of accessory musculature GI: No organomegaly, abdomen is soft and non-tender, positive bowel sounds.  No masses. Skin: No rashes. Neurologic: Facial musculature  symmetric. Psychiatric: Patient is appropriate throughout our interaction. Lymphatic: No cervical lymphadenopathy Musculoskeletal: Gait intact.   LABS: Results for orders placed in visit on 01/04/13  MICROALBUMIN / CREATININE URINE RATIO      Result Value Range   Microalb, Ur 0.2  0.0 - 1.9 mg/dL   Creatinine,U 244.0     Microalb Creat Ratio 0.1  0.0 - 30.0 mg/g  HM DIABETES EYE EXAM      Result Value Range   HM Diabetic Eye Exam per report follow very closely by Dr. Luciana Axe       EKG/XRAY:   Primary read interpreted by Dr. Conley Rolls at [facility].   ASSESSMENT/PLAN: Encounter Diagnoses  Name Primary?  . Acute bronchitis Yes  . Cough    Rx Z pack ( he has taken it before), sxs treatment first if no improvement then take z pack Rx Tessalon Perles for PND cough He has glaucoma , no steroids Gross sideeffects, risk and benefits, and alternatives of medications d/w patient. Patient is aware that all medications have potential sideeffects and we are unable to predict every sideeffect or drug-drug interaction that may occur.  LE, THAO PHUONG, DO 02/01/2013 11:43 AM

## 2013-02-08 ENCOUNTER — Telehealth: Payer: Self-pay | Admitting: Cardiology

## 2013-02-08 ENCOUNTER — Telehealth: Payer: Self-pay | Admitting: Internal Medicine

## 2013-02-08 NOTE — Telephone Encounter (Signed)
Called patient, he stated that he needs Dr. Patty Sermons to recommend another PCP for him. He is concerned with the PCP he was recommended to previously. Please call.

## 2013-02-08 NOTE — Telephone Encounter (Signed)
Returned call to patient after speaking with Dr. Selena Wilkinson regarding patient's concerns.  I informed the patient of Dr. Elmyra Wilkinson recommendation of him seeing a psychiatrists.  Also pt was informed of Dr. Elmyra Wilkinson concerns for his safety due to risks associated with patient taking these medications at his age.  Stephen Wilkinson was not receptive to this recommendation and stated he would be looking for another physician in the area.

## 2013-02-08 NOTE — Telephone Encounter (Signed)
Message copied by Stephen Wilkinson on Wed Feb 08, 2013  4:02 PM ------      Message from: Stephen Wilkinson      Created: Tue Feb 07, 2013  4:53 PM      Regarding: Patient Medication Issue      Contact: [phone]       Stephen Wilkinson,             This is the patient I told you about who wanted to know if you could talk with Dr. Selena Batten. He is on low dose Ambien and Xanax. He is in his 68s or 18s. He states he saw psych 25 yrs ago. Does not need to see them now. Has been following with Dr. Patty Sermons, but wanted to get a PCP. [facility] has been rx-ing these 2 meds all along. Per patient's words, he is a "hyper guy"; is going blind d/t macular degeneration, and has had trouble with his wife - she left him, but is now back. That is why he is on the Ambien and Xanax - very low dose. He has approximately 5 wks left of his meds. Let me know if you need further info.            Thank you,      Stephen Wilkinson       ------

## 2013-02-08 NOTE — Telephone Encounter (Signed)
New problem  Pt need recommendations for primary care.

## 2013-02-15 ENCOUNTER — Telehealth: Payer: Self-pay | Admitting: Cardiology

## 2013-02-15 DIAGNOSIS — J40 Bronchitis, not specified as acute or chronic: Secondary | ICD-10-CM

## 2013-02-15 NOTE — Telephone Encounter (Signed)
1) Patient did not think things went well with Dr Selena Batten and would like a recommendation on another MD (would like someone with the Epic system).  2)ok to refer to pulmonary doctor for recurrent bronchitis  Will forward to  Dr. Patty Sermons for review

## 2013-02-15 NOTE — Telephone Encounter (Signed)
New problem    Need to discuss issues regarding primary care

## 2013-02-15 NOTE — Telephone Encounter (Signed)
Could have him check with Dr. Oliver Barre.

## 2013-02-16 DIAGNOSIS — H353290 Exudative age-related macular degeneration, unspecified eye, stage unspecified: Secondary | ICD-10-CM | POA: Diagnosis not present

## 2013-02-16 DIAGNOSIS — H35059 Retinal neovascularization, unspecified, unspecified eye: Secondary | ICD-10-CM | POA: Diagnosis not present

## 2013-02-17 NOTE — Telephone Encounter (Signed)
Ov with Dr Delton Coombes 03/10/13 at 3:30  Advised patient

## 2013-02-22 ENCOUNTER — Telehealth: Payer: Self-pay | Admitting: Internal Medicine

## 2013-02-22 NOTE — Telephone Encounter (Signed)
Mr. Massar asked for a recomendation from Dr. Patty Sermons to switch PCP.  Dr. Jonny Ruiz has said OK.  Will this be OK to switch to Dr. Jonny Ruiz.

## 2013-02-22 NOTE — Telephone Encounter (Signed)
Ok with me 

## 2013-02-22 NOTE — Telephone Encounter (Signed)
Pt of Dr Yevonne Pax, would like you to accept him and his wife as a new patient. See Dr Yevonne Pax phone note

## 2013-02-23 NOTE — Telephone Encounter (Signed)
Please let him know, I do not usually advise complicated geriatric patients to switch. I am family medicine and my focus is families, children and young adults. I also will be out on maternity leave soon. I also do not prescribe several of the medications he is taking (xanax, and [place]). I would suggest from looking at his chart if he is looking for a new physician that he see a geriatrician or an internal medicine doctor.

## 2013-02-23 NOTE — Telephone Encounter (Signed)
Appt with Dr. Jonny Ruiz on Oct 16.

## 2013-03-10 ENCOUNTER — Encounter: Payer: Self-pay | Admitting: Emergency Medicine

## 2013-03-10 ENCOUNTER — Ambulatory Visit (INDEPENDENT_AMBULATORY_CARE_PROVIDER_SITE_OTHER): Payer: Medicare Other | Admitting: Emergency Medicine

## 2013-03-10 VITALS — BP 124/78 | HR 56 | Temp 98.3°F | Ht 73.0 in | Wt 167.4 lb

## 2013-03-10 DIAGNOSIS — R053 Chronic cough: Secondary | ICD-10-CM | POA: Insufficient documentation

## 2013-03-10 DIAGNOSIS — R05 Cough: Secondary | ICD-10-CM | POA: Diagnosis not present

## 2013-03-10 DIAGNOSIS — R059 Cough, unspecified: Secondary | ICD-10-CM

## 2013-03-10 NOTE — Patient Instructions (Addendum)
I suspect that your cough is being driven (at least in part) by allergic rhinitis and possibly reflux disease.  Please contact our office if / when your coughing starts so we can plan a regimen to help you Follow with Dr Delton Coombes in 6 months or sooner if you have any problems

## 2013-03-10 NOTE — Progress Notes (Signed)
Subjective:    Patient ID: Stephen Wilkinson, male    DOB: Dec 12, 1928, 79 y.o.   MRN: [id number]  HPI 79 yo never smoker w hx HTN, hyperlipidemia, DM, remote CVA, macular degeneration. He is referred by Dr Earney Hamburg for eval of chronic cough, chest congestion. This has been a problem now for about 3-4 years, happens off/on although not necessarily seasonal. He does have sneezing, nasal congestion at the times when this is flaring. Seems to be quiet right now. Bothers him especially when laying flat. The cough is mostly non-productive. He feels a pressure in his chest at these times. Can be associated with wheezing.    He is on pepcid, has been on nexium before but didn't tolerate. He has rare breakthrough GERD on the nexium.     Review of Systems  Constitutional: Positive for unexpected weight change. Negative for fever.  HENT: Positive for congestion, sneezing and postnasal drip. Negative for ear pain, nosebleeds, sore throat, rhinorrhea, trouble swallowing, dental problem and sinus pressure.   Eyes: Negative for redness and itching.  Respiratory: Positive for cough, chest tightness and wheezing. Negative for shortness of breath.   Cardiovascular: Negative for palpitations and leg swelling.  Gastrointestinal: Negative for nausea and vomiting.  Genitourinary: Negative for dysuria.  Musculoskeletal: Negative for joint swelling.  Skin: Negative for rash.  Neurological: Negative for headaches.  Hematological: Does not bruise/bleed easily.  Psychiatric/Behavioral: Negative for dysphoric mood. The patient is not nervous/anxious.    Past Medical History  Diagnosis Date  . Hypertension   . Hyperlipidemia   . Diabetes mellitus   . Hypothyroidism   . Macular degeneration   . OA (osteoarthritis)   . Anxiety   . Depression   . PVC's (premature ventricular contractions)   . Hx: UTI (urinary tract infection)   . Insomnia   . Stroke     mid 1990s   . Pneumonia   . Chronic kidney disease     hx of  uti due to catheter hx of prostatitis, BPH -   . Cancer     basal and squamous skin carcinoma   . Cataract   . Glaucoma   . Blood transfusion without reported diagnosis   . Altered mental status 05/15/2011  . Hypokalemia 05/15/2011  . Acute blood loss anemia 05/15/2011    post surgical  . Glaucoma 01/04/2013  . Hx of transient ischemic attack (TIA) 01/04/2013  . Macular degeneration      Family History  Problem Relation Age of Onset  . Hypertension Mother   . Arthritis Mother   . Diabetes Mother   . Hypertension Father   . Stroke Father   . Kidney disease Father   . Hypertension Brother   . Diabetes Brother      History   Social History  . Marital Status: Married    Spouse Name: N/A    Number of Children: 3  . Years of Education: N/A   Occupational History  . retired     Field seismologist center [employer]   Social History Main Topics  . Smoking status: Never Smoker   . Smokeless tobacco: Never Used  . Alcohol Use: 4.2 oz/week    7 Shots of liquor per week     Comment: 2 drinks most days  . Drug Use: No  . Sexual Activity: No   Other Topics Concern  . 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 Lipitor [Atorvastatin Calcium]     NOT and allergy  . Zetia [Ezetimibe]     NOT an allergy     Outpatient Prescriptions Prior to Visit  Medication Sig Dispense Refill  . ALPRAZolam (XANAX) 0.25 MG tablet Take 1 tablet (0.25 mg total) by mouth daily as needed.  90 tablet  1  . amLODipine (NORVASC) 5 MG tablet Take 1 tablet (5 mg total) by mouth every morning.  90 tablet  3  . aspirin 81 MG chewable tablet Chew 81 mg by mouth daily.      . Bevacizumab (AVASTIN IV) Place into the right eye as directed. Every 6 weeks      . clopidogrel (PLAVIX) 75  MG tablet Take 1 tablet (75 mg total) by mouth daily.  90 tablet  3  . digoxin (LANOXIN) 0.25 MG tablet 1 tablet Monday, Tuesday, Thursday, Friday, and Saturday.. 2 tablet on Sunday and wednesday  125 tablet  3  . famotidine (PEPCID) 40 MG tablet Take 1 tablet (40 mg total) by mouth daily.  90 tablet  3  . latanoprost (XALATAN) 0.005 % ophthalmic solution Place 1 drop into both eyes at bedtime.       Marland Kitchen levothyroxine (SYNTHROID, LEVOTHROID) 100 MCG tablet 1 tablet on Sunday, Tuesday, Thursday, and Saturday.  1 and 1/2 tablets on Monday, Wednesday, and Friday.  145 tablet  3  . liothyronine (CYTOMEL) 25 MCG tablet Take 1 tablet (25 mcg total) by mouth every morning. 1 tablet 6 days a week. Pt does not take on sunday  90 tablet  3  . metFORMIN (GLUCOPHAGE) 1000 MG tablet Take 1000 mg daily  90 tablet  3  . metoprolol succinate (TOPROL-XL) 25 MG 24 hr tablet Take 1 tablet (25 mg total) by mouth every morning.  90 tablet  3  . multivitamin-lutein (OCUVITE-LUTEIN) CAPS Take 1 capsule by mouth daily.      Marland Kitchen ofloxacin (OCUFLOX) 0.3 % ophthalmic solution as directed.      . sildenafil (VIAGRA) 100 MG tablet Take 1 tablet (100 mg total) by mouth daily as needed.  6 tablet  prn  . zolpidem (AMBIEN) 5 MG tablet Take 1 tablet (5 mg total) by mouth at bedtime as needed for sleep.  90 tablet  1  . azithromycin (ZITHROMAX) 250 MG tablet Take 2 tabs PO now and then 1 tab PO daily for the next 4 days  6 tablet  0  . benzonatate (TESSALON) 100 MG capsule Take 2 capsules (200 mg total) by mouth 2 (two) times daily as needed for cough.  30 capsule  0   No facility-administered medications prior to visit.       Objective:   Physical Exam Filed Vitals:   03/10/13 1537  BP: 124/78  Pulse: 56  Temp: 98.3 F (36.8 C)  TempSrc: Oral  Height: 6\' 1"  (1.854 m)  Weight: 167 lb 6.4 oz (75.932 kg)  SpO2: 99%   Gen: Pleasant, well-nourished, in no distress,  normal affect  ENT: No lesions,  mouth clear,  oropharynx  clear, no postnasal drip  Neck: No JVD, no TMG, no carotid bruits  Lungs: No use of accessory muscles, no dullness to percussion, clear without rales or rhonchi  Cardiovascular: RRR, heart  sounds normal, no murmur or gallops, no peripheral edema  Musculoskeletal: No deformities, no cyanosis or clubbing  Neuro: alert, non focal  Skin: Warm, no lesions or rashes      Assessment & Plan:  Chronic cough Sounds like there are components of both GERD and allergies. He isn't active right now, but I would treat both when he starts coughing again. He will call me when it starts.

## 2013-03-10 NOTE — Assessment & Plan Note (Signed)
Sounds like there are components of both GERD and allergies. He isn't active right now, but I would treat both when he starts coughing again. He will call me when it starts.

## 2013-03-22 DIAGNOSIS — H353290 Exudative age-related macular degeneration, unspecified eye, stage unspecified: Secondary | ICD-10-CM | POA: Diagnosis not present

## 2013-03-22 DIAGNOSIS — H35059 Retinal neovascularization, unspecified, unspecified eye: Secondary | ICD-10-CM | POA: Diagnosis not present

## 2013-03-22 DIAGNOSIS — H35359 Cystoid macular degeneration, unspecified eye: Secondary | ICD-10-CM | POA: Diagnosis not present

## 2013-03-30 ENCOUNTER — Ambulatory Visit (INDEPENDENT_AMBULATORY_CARE_PROVIDER_SITE_OTHER): Payer: Medicare Other | Admitting: Internal Medicine

## 2013-03-30 ENCOUNTER — Encounter: Payer: Self-pay | Admitting: Internal Medicine

## 2013-03-30 VITALS — BP 142/80 | HR 66 | Temp 98.3°F | Ht 73.5 in | Wt 168.1 lb

## 2013-03-30 DIAGNOSIS — E785 Hyperlipidemia, unspecified: Secondary | ICD-10-CM | POA: Insufficient documentation

## 2013-03-30 DIAGNOSIS — F341 Dysthymic disorder: Secondary | ICD-10-CM

## 2013-03-30 DIAGNOSIS — Z0000 Encounter for general adult medical examination without abnormal findings: Secondary | ICD-10-CM | POA: Insufficient documentation

## 2013-03-30 DIAGNOSIS — I10 Essential (primary) hypertension: Secondary | ICD-10-CM

## 2013-03-30 DIAGNOSIS — Z7689 Persons encountering health services in other specified circumstances: Secondary | ICD-10-CM | POA: Insufficient documentation

## 2013-03-30 DIAGNOSIS — F329 Major depressive disorder, single episode, unspecified: Secondary | ICD-10-CM

## 2013-03-30 DIAGNOSIS — Z23 Encounter for immunization: Secondary | ICD-10-CM

## 2013-03-30 DIAGNOSIS — H543 Unqualified visual loss, both eyes: Secondary | ICD-10-CM | POA: Insufficient documentation

## 2013-03-30 DIAGNOSIS — N529 Male erectile dysfunction, unspecified: Secondary | ICD-10-CM | POA: Diagnosis not present

## 2013-03-30 DIAGNOSIS — F419 Anxiety disorder, unspecified: Secondary | ICD-10-CM

## 2013-03-30 DIAGNOSIS — C4490 Unspecified malignant neoplasm of skin, unspecified: Secondary | ICD-10-CM | POA: Insufficient documentation

## 2013-03-30 DIAGNOSIS — E119 Type 2 diabetes mellitus without complications: Secondary | ICD-10-CM | POA: Insufficient documentation

## 2013-03-30 MED ORDER — PAROXETINE HCL 10 MG PO TABS: 10.0000 mg | ORAL_TABLET | ORAL | Status: DC

## 2013-03-30 MED ORDER — SILDENAFIL CITRATE 20 MG PO TABS: 20.0000 mg | ORAL_TABLET | Freq: Three times a day (TID) | ORAL | Status: DC

## 2013-03-30 NOTE — Patient Instructions (Signed)
Please take all new medication as prescribed   - the paxil 10 mg per day, and the generic viagra Please call in 4 weeks if you feel you need a higher strength of the paxil to 20 mg Please continue all other medications as before, and refills have been done if requested. Please have the pharmacy call with any other refills you may need.  Please keep your appointments with your specialists as you have planned - Dr Patty Sermons  Please return in 6 months, or sooner if needed

## 2013-03-30 NOTE — Assessment & Plan Note (Signed)
To add paxil 10 qd,  to f/u any worsening symptoms or concerns

## 2013-03-30 NOTE — Assessment & Plan Note (Signed)
Ok for generic viagra prn,  to f/u any worsening symptoms or concerns 

## 2013-03-30 NOTE — Assessment & Plan Note (Signed)
stable overall by history and exam, recent data reviewed with pt, and pt to continue medical treatment as before,  to f/u any worsening symptoms or concerns BP Readings from Last 3 Encounters:  03/30/13 142/80  03/10/13 124/78  02/01/13 124/80

## 2013-03-30 NOTE — Progress Notes (Signed)
Subjective:    Patient ID: Stephen Wilkinson, male    DOB: 07-28-1928, 79 y.o.   MRN: [id number]  HPI  Here as new pt to me after transfer PCP; mentions has lost nearly all vision left eye due to macular degeneration - has only lateral periph vision only, has some vision loss on right as well due to same, getting every 5 wk injections per optho;  Mentions his vision loss has affected and made worse his anxiety; also not sure about his insuracne after the first of the new yr, stressful for him.  Seeing Dr Delton Coombes for bronchits per pt, thinks he may need tx for reflux, or GI referral.  Has overall lost wt 50 lbs not really intentional since retired, none of his MD's can seem to know why.  Denies worsening reflux (now on Pepcid) , abd pain, dysphagia, n/v, bowel change or blood.  Asks for generic viagra.  No other acute complaints.  Has some fatigue with his current BP meds, so hesitates to take other med. Past Medical History  Diagnosis Date  . Hypertension   . Hyperlipidemia   . Diabetes mellitus   . Hypothyroidism   . Macular degeneration   . OA (osteoarthritis)   . Anxiety   . Depression   . PVC's (premature ventricular contractions)   . Hx: UTI (urinary tract infection)   . Insomnia   . Stroke     mid 1990s   . Pneumonia   . Chronic kidney disease     hx of uti due to catheter hx of prostatitis, BPH -   . Cancer     basal and squamous skin carcinoma   . Cataract   . Glaucoma   . Blood transfusion without reported diagnosis   . Altered mental status 05/15/2011  . Hypokalemia 05/15/2011  . Acute blood loss anemia 05/15/2011    post surgical  . Glaucoma 01/04/2013  . Hx of transient ischemic attack (TIA) 01/04/2013  . Macular degeneration    Past Surgical History  Procedure Laterality Date  . Replacement total knee    . Thyroidectomy    . Appendectomy    . Tonsillectomy and adenoidectomy    . Inguinal hernia repair    . US echocardiography  11/10/2006    EF 55-60%  . US echocardiography   09/20/2002    EF 65-70%  . Other surgical history      microwave procedure for prostate - 09/12   . Total knee arthroplasty  05/11/2011    Procedure: TOTAL KNEE ARTHROPLASTY;  Surgeon: Loanne Drilling;  Location: [facility];  Service: Orthopedics;  Laterality: Right;    reports that he has never smoked. He has never used smokeless tobacco. He reports that he drinks about 4.2 ounces of alcohol per week. He reports that he does not use illicit drugs. family history includes Arthritis in his mother; Diabetes in his brother and mother; Hypertension in his brother, father, and mother; Kidney disease in his father; Stroke in his father. Allergies  Allergen Reactions  . Lipitor [Atorvastatin Calcium]     NOT and allergy  . Zetia [Ezetimibe]     NOT an allergy   Current Outpatient Prescriptions on File Prior to Visit  Medication Sig Dispense Refill  . ALPRAZolam (XANAX) 0.25 MG tablet Take 1 tablet (0.25 mg total) by mouth daily as needed.  90 tablet  1  . amLODipine (NORVASC) 5 MG tablet Take 1 tablet (5 mg total) by mouth every morning.  90 tablet  3  . aspirin 81 MG chewable tablet Chew 81 mg by mouth daily.      . Bevacizumab (AVASTIN IV) Place into the right eye as directed. Every 6 weeks      . clopidogrel (PLAVIX) 75 MG tablet Take 1 tablet (75 mg total) by mouth daily.  90 tablet  3  . digoxin (LANOXIN) 0.25 MG tablet 1 tablet Monday, Tuesday, Thursday, Friday, and Saturday.. 2 tablet on Sunday and wednesday  125 tablet  3  . famotidine (PEPCID) 40 MG tablet Take 1 tablet (40 mg total) by mouth daily.  90 tablet  3  . latanoprost (XALATAN) 0.005 % ophthalmic solution Place 1 drop into both eyes at bedtime.       Marland Kitchen levothyroxine (SYNTHROID, LEVOTHROID) 100 MCG tablet 1 tablet on Sunday, Tuesday, Thursday, and Saturday.  1 and 1/2 tablets on Monday, Wednesday, and Friday.  145 tablet  3  . liothyronine (CYTOMEL) 25 MCG tablet Take 1 tablet (25 mcg total) by mouth every morning. 1 tablet 6 days a  week. Pt does not take on sunday  90 tablet  3  . metFORMIN (GLUCOPHAGE) 1000 MG tablet Take 1000 mg daily  90 tablet  3  . metoprolol succinate (TOPROL-XL) 25 MG 24 hr tablet Take 1 tablet (25 mg total) by mouth every morning.  90 tablet  3  . multivitamin-lutein (OCUVITE-LUTEIN) CAPS Take 1 capsule by mouth daily.      Marland Kitchen zolpidem (AMBIEN) 5 MG tablet Take 1 tablet (5 mg total) by mouth at bedtime as needed for sleep.  90 tablet  1  . [DISCONTINUED] warfarin (COUMADIN) 3 MG tablet Take 1 tablet (3 mg total) by mouth one time only at 6 PM.  35 tablet  0   No current facility-administered medications on file prior to visit.   Review of Systems  Constitutional: Negative for unexpected weight change, or unusual diaphoresis  HENT: Negative for tinnitus.   Eyes: Negative for photophobia and visual disturbance.  Respiratory: Negative for choking and stridor.   Gastrointestinal: Negative for vomiting and blood in stool.  Genitourinary: Negative for hematuria and decreased urine volume.  Musculoskeletal: Negative for acute joint swelling Skin: Negative for color change and wound.  Neurological: Negative for tremors and numbness other than noted  Psychiatric/Behavioral: Negative for decreased concentration or  hyperactivity.       Objective:   Physical Exam BP 142/80  Pulse 66  Temp(Src) 98.3 F (36.8 C) (Oral)  Ht 6' 1.5" (1.867 m)  Wt 168 lb 2 oz (76.261 kg)  BMI 21.88 kg/m2  SpO2 92% VS noted,  Constitutional: Pt appears well-developed and well-nourished.  HENT: Head: NCAT.  Right Ear: External ear normal.  Left Ear: External ear normal.  Eyes: Conjunctivae and EOM are normal. Pupils are equal, round, and reactive to light.  Neck: Normal range of motion. Neck supple.  Cardiovascular: Normal rate and regular rhythm.   Pulmonary/Chest: Effort normal and breath sounds normal.  Abd:  Soft, NT, non-distended, + BS Neurological: Pt is alert. Not confused  Skin: Skin is warm. No  erythema.  Psychiatric: Pt behavior is normal. Thought content normal. 2+ nervous., pressured speech    Assessment & Plan:

## 2013-04-19 ENCOUNTER — Ambulatory Visit: Payer: Medicare Other | Admitting: Podiatrist

## 2013-04-25 DIAGNOSIS — H35059 Retinal neovascularization, unspecified, unspecified eye: Secondary | ICD-10-CM | POA: Diagnosis not present

## 2013-04-25 DIAGNOSIS — H353290 Exudative age-related macular degeneration, unspecified eye, stage unspecified: Secondary | ICD-10-CM | POA: Diagnosis not present

## 2013-04-26 ENCOUNTER — Other Ambulatory Visit (INDEPENDENT_AMBULATORY_CARE_PROVIDER_SITE_OTHER): Payer: Medicare Other

## 2013-04-26 DIAGNOSIS — E785 Hyperlipidemia, unspecified: Secondary | ICD-10-CM

## 2013-04-26 DIAGNOSIS — F411 Generalized anxiety disorder: Secondary | ICD-10-CM

## 2013-04-26 DIAGNOSIS — E1149 Type 2 diabetes mellitus with other diabetic neurological complication: Secondary | ICD-10-CM

## 2013-04-26 DIAGNOSIS — E039 Hypothyroidism, unspecified: Secondary | ICD-10-CM

## 2013-04-26 DIAGNOSIS — R002 Palpitations: Secondary | ICD-10-CM | POA: Diagnosis not present

## 2013-04-26 DIAGNOSIS — E1142 Type 2 diabetes mellitus with diabetic polyneuropathy: Secondary | ICD-10-CM

## 2013-04-26 DIAGNOSIS — F419 Anxiety disorder, unspecified: Secondary | ICD-10-CM

## 2013-04-26 DIAGNOSIS — E119 Type 2 diabetes mellitus without complications: Secondary | ICD-10-CM

## 2013-04-26 DIAGNOSIS — IMO0001 Reserved for inherently not codable concepts without codable children: Secondary | ICD-10-CM

## 2013-04-26 DIAGNOSIS — I119 Hypertensive heart disease without heart failure: Secondary | ICD-10-CM

## 2013-04-26 LAB — BASIC METABOLIC PANEL
GFR: 65.91 mL/min (ref >60.00)
Glucose, Bld: 147 mg/dL — ABNORMAL HIGH (ref 70–99)
Potassium: 5.4 mEq/L — ABNORMAL HIGH (ref 3.5–5.1)
Sodium: 139 mEq/L (ref 135–145)

## 2013-04-26 LAB — LIPID PANEL
HDL: 57.4 mg/dL (ref >39.00)
VLDL: 10.6 mg/dL (ref 0.0–40.0)

## 2013-04-26 LAB — CBC
HCT: 42 % (ref 39.0–52.0)
Hemoglobin: 14.4 g/dL (ref 13.0–17.0)
MCHC: 34.3 g/dL (ref 30.0–36.0)
RDW: 13.7 % (ref 11.5–14.6)

## 2013-04-26 LAB — HEPATIC FUNCTION PANEL
ALT: 19 U/L (ref 0–53)
Total Bilirubin: 1 mg/dL (ref 0.3–1.2)

## 2013-04-26 LAB — HEMOGLOBIN A1C: Hgb A1c MFr Bld: 6.4 % (ref 4.6–6.5)

## 2013-04-26 NOTE — Progress Notes (Signed)
Quick Note:  Please make copy of labs for patient visit. ______ 

## 2013-05-01 ENCOUNTER — Ambulatory Visit: Payer: Medicare Other | Admitting: Family Medicine

## 2013-05-02 ENCOUNTER — Encounter: Payer: Self-pay | Admitting: Cardiology

## 2013-05-02 ENCOUNTER — Ambulatory Visit (INDEPENDENT_AMBULATORY_CARE_PROVIDER_SITE_OTHER): Payer: Medicare Other | Admitting: Cardiology

## 2013-05-02 VITALS — BP 160/73 | HR 68 | Ht 73.5 in | Wt 167.0 lb

## 2013-05-02 DIAGNOSIS — E1142 Type 2 diabetes mellitus with diabetic polyneuropathy: Secondary | ICD-10-CM

## 2013-05-02 DIAGNOSIS — E119 Type 2 diabetes mellitus without complications: Secondary | ICD-10-CM

## 2013-05-02 DIAGNOSIS — F419 Anxiety disorder, unspecified: Secondary | ICD-10-CM

## 2013-05-02 DIAGNOSIS — E039 Hypothyroidism, unspecified: Secondary | ICD-10-CM

## 2013-05-02 DIAGNOSIS — E1149 Type 2 diabetes mellitus with other diabetic neurological complication: Secondary | ICD-10-CM

## 2013-05-02 DIAGNOSIS — I119 Hypertensive heart disease without heart failure: Secondary | ICD-10-CM | POA: Diagnosis not present

## 2013-05-02 DIAGNOSIS — K219 Gastro-esophageal reflux disease without esophagitis: Secondary | ICD-10-CM

## 2013-05-02 DIAGNOSIS — F411 Generalized anxiety disorder: Secondary | ICD-10-CM

## 2013-05-02 DIAGNOSIS — I10 Essential (primary) hypertension: Secondary | ICD-10-CM

## 2013-05-02 DIAGNOSIS — R002 Palpitations: Secondary | ICD-10-CM

## 2013-05-02 DIAGNOSIS — G4700 Insomnia, unspecified: Secondary | ICD-10-CM

## 2013-05-02 MED ORDER — ZOLPIDEM TARTRATE 5 MG PO TABS: 5.0000 mg | ORAL_TABLET | Freq: Every evening | ORAL | Status: DC | PRN

## 2013-05-02 MED ORDER — AMLODIPINE BESYLATE 5 MG PO TABS: 5.0000 mg | ORAL_TABLET | ORAL | Status: DC

## 2013-05-02 MED ORDER — SILDENAFIL CITRATE 20 MG PO TABS: 20.0000 mg | ORAL_TABLET | Freq: Three times a day (TID) | ORAL | Status: DC

## 2013-05-02 MED ORDER — FAMOTIDINE 40 MG PO TABS: 40.0000 mg | ORAL_TABLET | Freq: Every day | ORAL | Status: DC

## 2013-05-02 MED ORDER — METOPROLOL SUCCINATE ER 25 MG PO TB24: 25.0000 mg | ORAL_TABLET | ORAL | Status: DC

## 2013-05-02 MED ORDER — METFORMIN HCL 1000 MG PO TABS: ORAL_TABLET | ORAL | Status: DC

## 2013-05-02 MED ORDER — ALPRAZOLAM 0.25 MG PO TABS: 0.2500 mg | ORAL_TABLET | Freq: Two times a day (BID) | ORAL | Status: DC

## 2013-05-02 MED ORDER — LIOTHYRONINE SODIUM 25 MCG PO TABS: 25.0000 ug | ORAL_TABLET | ORAL | Status: DC

## 2013-05-02 MED ORDER — LEVOTHYROXINE SODIUM 100 MCG PO TABS: ORAL_TABLET | ORAL | Status: DC

## 2013-05-02 MED ORDER — CLOPIDOGREL BISULFATE 75 MG PO TABS: 75.0000 mg | ORAL_TABLET | Freq: Every day | ORAL | Status: DC

## 2013-05-02 MED ORDER — DIGOXIN 250 MCG PO TABS: ORAL_TABLET | ORAL | Status: DC

## 2013-05-02 NOTE — Assessment & Plan Note (Signed)
Blood pressure has been running high because of stress

## 2013-05-02 NOTE — Assessment & Plan Note (Signed)
The patient has not been experiencing any hypoglycemic episodes. 

## 2013-05-02 NOTE — Assessment & Plan Note (Signed)
Recent lab work shows normal thyroid function on current doses of medication

## 2013-05-02 NOTE — Progress Notes (Signed)
[name] Date of Birth:  01/03/1929 [address] [place], [place]  [address] [phone]         Fax   [phone]  History of Present Illness: This pleasant 79 year old gentleman is seen for a scheduled followup office visit. He has a history of diabetes and high blood pressure. He does not have any history of ischemic heart disease. He has had a past history of palpitations. He has hypothyroidism, BPH and osteoarthritis. He has had both of his knees replaced. Since his last visit he has had no new cardiac symptoms.  Since last visit he has noted increasing fatigue.  After he works in the yard about an hour he has to stop and rest.  Since last visit he has not been doing well emotionally.  He has been under a lot of emotional stress at home.  He is going to see a marriage Veterinary surgeon.  His PCP gave him a trial of paroxetine seen but he did not get it filled after he read the potential side effects and interaction with clopidogrel.   Current Outpatient Prescriptions  Medication Sig Dispense Refill  . ALPRAZolam (XANAX) 0.25 MG tablet Take 1 tablet (0.25 mg total) by mouth 2 (two) times daily.  180 tablet  1  . amLODipine (NORVASC) 5 MG tablet Take 1 tablet (5 mg total) by mouth every morning.  90 tablet  3  . aspirin 81 MG chewable tablet Chew 81 mg by mouth daily.      . Bevacizumab (AVASTIN IV) Place into the right eye as directed. Every 6 weeks      . clopidogrel (PLAVIX) 75 MG tablet Take 1 tablet (75 mg total) by mouth daily.  90 tablet  3  . digoxin (LANOXIN) 0.25 MG tablet 1 tablet Monday, Tuesday, Thursday, Friday, and Saturday.. 2 tablet on Sunday and wednesday  125 tablet  3  . famotidine (PEPCID) 40 MG tablet Take 1 tablet (40 mg total) by mouth daily.  90 tablet  3  . latanoprost (XALATAN) 0.005 % ophthalmic solution Place 1 drop into both eyes at bedtime.       Marland Kitchen levothyroxine (SYNTHROID, LEVOTHROID) 100 MCG tablet 1 tablet on Sunday, Tuesday, Thursday, and  Saturday.  1 and 1/2 tablets on Monday, Wednesday, and Friday.  145 tablet  3  . liothyronine (CYTOMEL) 25 MCG tablet Take 1 tablet (25 mcg total) by mouth every morning. 1 tablet 6 days a week. Pt does not take on sunday  90 tablet  3  . metFORMIN (GLUCOPHAGE) 1000 MG tablet Take 1000 mg daily  90 tablet  3  . metoprolol succinate (TOPROL-XL) 25 MG 24 hr tablet Take 1 tablet (25 mg total) by mouth every morning.  90 tablet  3  . multivitamin-lutein (OCUVITE-LUTEIN) CAPS Take 1 capsule by mouth daily.      . sildenafil (REVATIO) 20 MG tablet Take 1 tablet (20 mg total) by mouth 3 (three) times daily.  50 tablet  11  . zolpidem (AMBIEN) 5 MG tablet Take 1 tablet (5 mg total) by mouth at bedtime as needed for sleep.  90 tablet  1  . [DISCONTINUED] warfarin (COUMADIN) 3 MG tablet Take 1 tablet (3 mg total) by mouth one time only at 6 PM.  35 tablet  0   No current facility-administered medications for this visit.    Allergies  Allergen Reactions  . Lipitor [Atorvastatin Calcium]     NOT and allergy  . Zetia [Ezetimibe]  NOT an allergy    Patient Active Problem List   Diagnosis Date Noted  . Preventative health care 03/30/2013  . Impaired vision in both eyes 03/30/2013  . Erectile dysfunction 03/30/2013  . HTN (hypertension) 03/30/2013  . Skin cancer   . Hyperlipidemia   . Chronic cough 03/10/2013  . First degree heart block 01/04/2013  . DM type 2 with diabetic peripheral neuropathy 01/04/2013  . BPH (benign prostatic hyperplasia) 01/04/2013  . Hx of transient ischemic attack (TIA) 01/04/2013  . Palpitations 01/04/2013  . Glaucoma 01/04/2013  . Osteoarthritis of right knee 05/12/2011  . Hypothyroidism 12/25/2010  . Benign hypertensive heart disease without heart failure 12/25/2010  . Dyslipidemia 12/25/2010  . Anxiety and depression 12/25/2010  . Macular degeneration of both eyes 12/25/2010    History  Smoking status  . Never Smoker   Smokeless tobacco  . Never Used     History  Alcohol Use  . 4.2 oz/week  . 7 Shots of liquor per week    Comment: 2 drinks most days    Family History  Problem Relation Age of Onset  . Hypertension Mother   . Arthritis Mother   . Diabetes Mother   . Hypertension Father   . Stroke Father   . Kidney disease Father   . Hypertension Brother   . Diabetes Brother     Review of Systems: Constitutional: no fever chills diaphoresis or fatigue or change in weight.  Head and neck: no hearing loss, no epistaxis, no photophobia or visual disturbance. Respiratory: No cough, shortness of breath or wheezing. Cardiovascular: No chest pain peripheral edema, palpitations. Gastrointestinal: No abdominal distention, no abdominal pain, no change in bowel habits hematochezia or melena. Genitourinary: No dysuria, no frequency, no urgency, no nocturia. Musculoskeletal:No arthralgias, no back pain, no gait disturbance or myalgias. Neurological: No dizziness, no headaches, no numbness, no seizures, no syncope, no weakness, no tremors. Hematologic: No lymphadenopathy, no easy bruising. Psychiatric: No confusion, no hallucinations, no sleep disturbance.    Physical Exam: Filed Vitals:   05/02/13 0913  BP: 160/73  Pulse: 68   The general appearance reveals a well-developed well-nourished gentleman in no distress.The head and neck exam reveals pupils equal and reactive.  Extraocular movements are full.  There is no scleral icterus.  The mouth and pharynx are normal.  The neck is supple.  The carotids reveal no bruits.  The jugular venous pressure is normal.  The  thyroid is not enlarged.  There is no lymphadenopathy.  The chest is clear to percussion and auscultation.  There are no rales or rhonchi.  Expansion of the chest is symmetrical.  The precordium is quiet.  The first heart sound is normal.  The second heart sound is physiologically split.  There is no murmur gallop rub or click.  There is no abnormal lift or heave.  The abdomen  is soft and nontender.  The bowel sounds are normal.  The liver and spleen are not enlarged.  There are no abdominal masses.  There are no abdominal bruits.  Extremities reveal good pedal pulses.  There is no phlebitis or edema.  There is no cyanosis or clubbing.  Strength is normal and symmetrical in all extremities.  There is no lateralizing weakness.  There are no sensory deficits.  The skin is warm and dry.  There is no rash.    Assessment / Plan: Continue same medication.  Continue metformin 1000 mg daily.  Needs to be more strict with his diet regarding  his diabetes. Recheck in 4 months for office visit lipid panel hepatic function panel basal metabolic panel A1c. Encouraged him to continue with seeing a marriage counselor. 4 increased management of stress he may increase his Xanax 0.25 mg up to twice a day if necessary.

## 2013-05-02 NOTE — Patient Instructions (Signed)
INCREASE YOUR XANAX 0.25 MG TO TWICE A DAY  Your physician recommends that you schedule a follow-up appointment in: 4 months with fasting labs (lp/bmet/hfp,a1c)

## 2013-05-03 ENCOUNTER — Encounter: Payer: Self-pay | Admitting: Podiatrist

## 2013-05-03 ENCOUNTER — Ambulatory Visit (INDEPENDENT_AMBULATORY_CARE_PROVIDER_SITE_OTHER): Payer: Medicare Other | Admitting: Podiatrist

## 2013-05-03 VITALS — BP 162/79 | HR 86 | Resp 12 | Ht 72.0 in | Wt 167.0 lb

## 2013-05-03 DIAGNOSIS — E1149 Type 2 diabetes mellitus with other diabetic neurological complication: Secondary | ICD-10-CM

## 2013-05-03 DIAGNOSIS — E1142 Type 2 diabetes mellitus with diabetic polyneuropathy: Secondary | ICD-10-CM

## 2013-05-03 DIAGNOSIS — M216X9 Other acquired deformities of unspecified foot: Secondary | ICD-10-CM | POA: Diagnosis not present

## 2013-05-03 DIAGNOSIS — Q828 Other specified congenital malformations of skin: Secondary | ICD-10-CM

## 2013-05-03 DIAGNOSIS — E1140 Type 2 diabetes mellitus with diabetic neuropathy, unspecified: Secondary | ICD-10-CM

## 2013-05-03 DIAGNOSIS — M216X1 Other acquired deformities of right foot: Secondary | ICD-10-CM

## 2013-05-03 NOTE — Progress Notes (Signed)
   HPI:  Patient presents today for follow up of foot and nail care. Denies any new complaints today. He does continue to have neuropathy and states his mother and grandmother ended up with amputations due to circulation and neuropathy.   Objective:  Patients chart is reviewed.  Neurovascular status unchanged.  Patients nails are thickened, discolored, distrophic, friable and brittle with yellow-brown discoloration. Patient subjectively relates they are painful with shoes and with ambulation of bilateral feet. He also has intractable porokeratotic lesions submet 5 right and submet 1 right.  Pedal pulses palpable and strong bilateral  Assessment:  Symptomatic onychomycosis, diabetes with neuropathy, plantar flexed metatarsals, pororkeratotic lesions x 2  Plan:  Discussed treatment options and alternatives.  The symptomatic toenails were debrided through manual an mechanical means without complication.  The lesions were debrided without complication. Return appointment recommended at routine intervals of 3 months    Marlowe Aschoff, DPM

## 2013-05-19 DIAGNOSIS — D485 Neoplasm of uncertain behavior of skin: Secondary | ICD-10-CM | POA: Diagnosis not present

## 2013-05-19 DIAGNOSIS — L570 Actinic keratosis: Secondary | ICD-10-CM | POA: Diagnosis not present

## 2013-05-19 DIAGNOSIS — L910 Hypertrophic scar: Secondary | ICD-10-CM | POA: Diagnosis not present

## 2013-05-19 DIAGNOSIS — Z85828 Personal history of other malignant neoplasm of skin: Secondary | ICD-10-CM | POA: Diagnosis not present

## 2013-05-19 DIAGNOSIS — L820 Inflamed seborrheic keratosis: Secondary | ICD-10-CM | POA: Diagnosis not present

## 2013-05-19 DIAGNOSIS — C4441 Basal cell carcinoma of skin of scalp and neck: Secondary | ICD-10-CM | POA: Diagnosis not present

## 2013-05-22 ENCOUNTER — Telehealth: Payer: Self-pay | Admitting: Cardiology

## 2013-05-22 ENCOUNTER — Telehealth: Payer: Self-pay | Admitting: Cardiovascular Disease

## 2013-05-22 NOTE — Telephone Encounter (Signed)
Will forward to Dr Yevonne Pax nurse.

## 2013-05-22 NOTE — Telephone Encounter (Signed)
Left message to call back  

## 2013-05-22 NOTE — Telephone Encounter (Signed)
Follow up   Pt was told to call back 2 wk after appt.  Please call pt back to review med.

## 2013-05-23 ENCOUNTER — Telehealth: Payer: Self-pay | Admitting: Cardiology

## 2013-05-23 ENCOUNTER — Other Ambulatory Visit: Payer: Medicare Other

## 2013-05-23 ENCOUNTER — Other Ambulatory Visit: Payer: Self-pay | Admitting: *Deleted

## 2013-05-23 DIAGNOSIS — Z79899 Other long term (current) drug therapy: Secondary | ICD-10-CM

## 2013-05-23 NOTE — Telephone Encounter (Signed)
Spoke with patient and he is coming in today to speak with me regarding refills

## 2013-05-23 NOTE — Telephone Encounter (Signed)
°  Patient is returning your call, please call back.  °

## 2013-05-23 NOTE — Telephone Encounter (Signed)
Spoke with patient and he is coming in today to speak with me regarding refills  

## 2013-05-24 NOTE — Telephone Encounter (Signed)
Message copied by Burnell Blanks on Wed May 24, 2013  6:16 PM ------      Message from: Cassell Clement      Created: Wed May 24, 2013 12:33 PM       Please report.  The digoxin level is much too high.  I want to reduce his chronic digoxin dose to just 0.125 mg daily 7 days a week ------

## 2013-05-24 NOTE — Telephone Encounter (Signed)
Advised patient of lab results and medication change  

## 2013-05-30 DIAGNOSIS — H353290 Exudative age-related macular degeneration, unspecified eye, stage unspecified: Secondary | ICD-10-CM | POA: Diagnosis not present

## 2013-05-30 DIAGNOSIS — H35059 Retinal neovascularization, unspecified, unspecified eye: Secondary | ICD-10-CM | POA: Diagnosis not present

## 2013-06-26 DIAGNOSIS — H40019 Open angle with borderline findings, low risk, unspecified eye: Secondary | ICD-10-CM | POA: Diagnosis not present

## 2013-07-10 DIAGNOSIS — H35059 Retinal neovascularization, unspecified, unspecified eye: Secondary | ICD-10-CM | POA: Diagnosis not present

## 2013-07-10 DIAGNOSIS — H353290 Exudative age-related macular degeneration, unspecified eye, stage unspecified: Secondary | ICD-10-CM | POA: Diagnosis not present

## 2013-08-03 ENCOUNTER — Ambulatory Visit (INDEPENDENT_AMBULATORY_CARE_PROVIDER_SITE_OTHER): Payer: Medicare Other | Admitting: Podiatrist

## 2013-08-03 VITALS — BP 144/88 | HR 67 | Resp 16 | Ht 73.0 in | Wt 171.0 lb

## 2013-08-03 DIAGNOSIS — M79609 Pain in unspecified limb: Secondary | ICD-10-CM

## 2013-08-03 DIAGNOSIS — E1149 Type 2 diabetes mellitus with other diabetic neurological complication: Secondary | ICD-10-CM | POA: Diagnosis not present

## 2013-08-03 DIAGNOSIS — M216X9 Other acquired deformities of unspecified foot: Secondary | ICD-10-CM

## 2013-08-03 DIAGNOSIS — Q828 Other specified congenital malformations of skin: Secondary | ICD-10-CM

## 2013-08-03 DIAGNOSIS — B351 Tinea unguium: Secondary | ICD-10-CM | POA: Diagnosis not present

## 2013-08-03 DIAGNOSIS — E1142 Type 2 diabetes mellitus with diabetic polyneuropathy: Secondary | ICD-10-CM

## 2013-08-03 DIAGNOSIS — E1140 Type 2 diabetes mellitus with diabetic neuropathy, unspecified: Secondary | ICD-10-CM

## 2013-08-03 NOTE — Progress Notes (Signed)
HPI: Patient presents today for follow up of foot and nail care. He is also here to pick up his diabetic inserts for his existing new balance shoes he prefers to purchase from the shoe market.  Denies any new complaints today. He does continue to have neuropathy and states his mother and grandmother ended up with amputations due to circulation and neuropathy.   Objective: Patients chart is reviewed. Neurovascular status unchanged. Patients nails are thickened, discolored, distrophic, friable and brittle with yellow-brown discoloration. Patient subjectively relates they are painful with shoes and with ambulation of bilateral feet. He also has intractable porokeratotic lesions submet 5 right and submet 1 right. Pedal pulses palpable and strong bilateral   Assessment: Symptomatic onychomycosis, diabetes with neuropathy, plantar flexed metatarsals, pororkeratotic lesions x 2   Plan: Discussed treatment options and alternatives. The symptomatic toenails were debrided through manual an mechanical means without complication. The lesions were debrided without complication. The diabetic inserts were ground down on the left a little to for the patient's comfort.  He states they feel comfortable. He is given instructions for break in and wear.  Return appointment recommended at routine intervals of 3 months

## 2013-08-03 NOTE — Progress Notes (Deleted)
Diabetic inserts only--   Fit ok in his new balance with roll bar.    Left one ground down today

## 2013-08-04 DIAGNOSIS — H02059 Trichiasis without entropian unspecified eye, unspecified eyelid: Secondary | ICD-10-CM | POA: Diagnosis not present

## 2013-08-15 DIAGNOSIS — H353290 Exudative age-related macular degeneration, unspecified eye, stage unspecified: Secondary | ICD-10-CM | POA: Diagnosis not present

## 2013-08-15 DIAGNOSIS — H35059 Retinal neovascularization, unspecified, unspecified eye: Secondary | ICD-10-CM | POA: Diagnosis not present

## 2013-08-16 ENCOUNTER — Other Ambulatory Visit: Payer: Self-pay

## 2013-08-16 DIAGNOSIS — K219 Gastro-esophageal reflux disease without esophagitis: Secondary | ICD-10-CM

## 2013-08-16 MED ORDER — FAMOTIDINE 40 MG PO TABS: 40.0000 mg | ORAL_TABLET | Freq: Every day | ORAL | Status: DC

## 2013-08-22 ENCOUNTER — Other Ambulatory Visit: Payer: Self-pay

## 2013-08-22 DIAGNOSIS — K219 Gastro-esophageal reflux disease without esophagitis: Secondary | ICD-10-CM

## 2013-08-22 MED ORDER — FAMOTIDINE 40 MG PO TABS: 40.0000 mg | ORAL_TABLET | Freq: Every day | ORAL | Status: DC

## 2013-08-22 MED ORDER — FAMOTIDINE 20 MG PO TABS: 20.0000 mg | ORAL_TABLET | Freq: Two times a day (BID) | ORAL | Status: DC

## 2013-09-01 ENCOUNTER — Other Ambulatory Visit (INDEPENDENT_AMBULATORY_CARE_PROVIDER_SITE_OTHER): Payer: Medicare Other

## 2013-09-01 DIAGNOSIS — E119 Type 2 diabetes mellitus without complications: Secondary | ICD-10-CM | POA: Diagnosis not present

## 2013-09-01 DIAGNOSIS — I119 Hypertensive heart disease without heart failure: Secondary | ICD-10-CM

## 2013-09-01 LAB — BASIC METABOLIC PANEL
BUN: 20 mg/dL (ref 6–23)
CHLORIDE: 105 meq/L (ref 96–112)
CO2: 29 meq/L (ref 19–32)
Calcium: 9.7 mg/dL (ref 8.4–10.5)
Creatinine, Ser: 1.1 mg/dL (ref 0.4–1.5)
GFR: 67.22 mL/min (ref >60.00)
Glucose, Bld: 130 mg/dL — ABNORMAL HIGH (ref 70–99)
POTASSIUM: 5.4 meq/L — AB (ref 3.5–5.1)
Sodium: 141 mEq/L (ref 135–145)

## 2013-09-01 LAB — HEPATIC FUNCTION PANEL
ALT: 19 U/L (ref 0–53)
AST: 20 U/L (ref 0–37)
Albumin: 4.5 g/dL (ref 3.5–5.2)
Alkaline Phosphatase: 63 U/L (ref 39–117)
BILIRUBIN DIRECT: 0 mg/dL (ref 0.0–0.3)
TOTAL PROTEIN: 7.1 g/dL (ref 6.0–8.3)
Total Bilirubin: 0.7 mg/dL (ref 0.3–1.2)

## 2013-09-01 LAB — LIPID PANEL
CHOL/HDL RATIO: 3
CHOLESTEROL: 174 mg/dL (ref 0–200)
HDL: 61.4 mg/dL (ref >39.00)
LDL CALC: 103 mg/dL — AB (ref 0–99)
Triglycerides: 46 mg/dL (ref 0.0–149.0)
VLDL: 9.2 mg/dL (ref 0.0–40.0)

## 2013-09-01 LAB — HEMOGLOBIN A1C: Hgb A1c MFr Bld: 6.3 % (ref 4.6–6.5)

## 2013-09-01 NOTE — Progress Notes (Signed)
Quick Note:  Please make copy of labs for patient visit. ______ 

## 2013-09-06 ENCOUNTER — Ambulatory Visit (INDEPENDENT_AMBULATORY_CARE_PROVIDER_SITE_OTHER): Payer: Medicare Other | Admitting: Cardiology

## 2013-09-06 ENCOUNTER — Encounter: Payer: Self-pay | Admitting: Cardiology

## 2013-09-06 VITALS — BP 155/66 | HR 57 | Ht 73.0 in | Wt 173.0 lb

## 2013-09-06 DIAGNOSIS — Z79899 Other long term (current) drug therapy: Secondary | ICD-10-CM | POA: Diagnosis not present

## 2013-09-06 DIAGNOSIS — E1149 Type 2 diabetes mellitus with other diabetic neurological complication: Secondary | ICD-10-CM

## 2013-09-06 DIAGNOSIS — E119 Type 2 diabetes mellitus without complications: Secondary | ICD-10-CM

## 2013-09-06 DIAGNOSIS — K219 Gastro-esophageal reflux disease without esophagitis: Secondary | ICD-10-CM

## 2013-09-06 DIAGNOSIS — I119 Hypertensive heart disease without heart failure: Secondary | ICD-10-CM | POA: Diagnosis not present

## 2013-09-06 DIAGNOSIS — H543 Unqualified visual loss, both eyes: Secondary | ICD-10-CM

## 2013-09-06 DIAGNOSIS — E1142 Type 2 diabetes mellitus with diabetic polyneuropathy: Secondary | ICD-10-CM

## 2013-09-06 DIAGNOSIS — E039 Hypothyroidism, unspecified: Secondary | ICD-10-CM | POA: Diagnosis not present

## 2013-09-06 MED ORDER — FAMOTIDINE 20 MG PO TABS: 20.0000 mg | ORAL_TABLET | Freq: Two times a day (BID) | ORAL | Status: DC

## 2013-09-06 MED ORDER — AMLODIPINE BESYLATE 5 MG PO TABS: 5.0000 mg | ORAL_TABLET | ORAL | Status: DC

## 2013-09-06 NOTE — Assessment & Plan Note (Signed)
The patient has macular degeneration problems as well as cataracts.  His eye doctors are hopeful that cataract surgery will improve his vision.

## 2013-09-06 NOTE — Assessment & Plan Note (Signed)
The patient has not been having any hypoglycemic episodes 

## 2013-09-06 NOTE — Assessment & Plan Note (Signed)
The patient has been experiencing some increased blood pressure which he attributes to the ongoing problem with marital stress.  He and his wife are both living under the same roof.

## 2013-09-06 NOTE — Progress Notes (Signed)
Stephen Wilkinson Date of Birth:  07-03-1928 [address] [place], [place]  [address] [phone]         Fax   [phone]  History of Present Illness: This pleasant 80 year old gentleman is seen for a scheduled followup office visit. He has a history of diabetes and high blood pressure. He does not have any history of ischemic heart disease. He has had a past history of palpitations. He has hypothyroidism, BPH and osteoarthritis. He has had both of his knees replaced. Since his last visit he has had no new cardiac symptoms.  Since last visit he has noted increasing fatigue.  After he works in the yard about an hour he has to stop and rest.  Since last visit he has not been doing well emotionally.  He has been under a lot of emotional stress at home.  He is going to see a marriage Social worker.  His PCP gave him a trial of paroxetine seen but he did not get it filled after he read the potential side effects and interaction with clopidogrel.  He is looking forward to getting more regular exercise.  He enjoys yard work.  Since last visit he has had a deterioration of his eyesight and anticipates having to have cataract surgery.   Current Outpatient Prescriptions  Medication Sig Dispense Refill  . ALPRAZolam (XANAX) 0.25 MG tablet Take 1 tablet (0.25 mg total) by mouth 2 (two) times daily.  180 tablet  1  . amLODipine (NORVASC) 5 MG tablet Take 1 tablet (5 mg total) by mouth every morning.  90 tablet  3  . aspirin 81 MG chewable tablet Chew 81 mg by mouth daily.      . Bevacizumab (AVASTIN IV) Place into the right eye as directed. Every 6 weeks      . clopidogrel (PLAVIX) 75 MG tablet Take 1 tablet (75 mg total) by mouth daily.  90 tablet  3  . digoxin (LANOXIN) 0.25 MG tablet 1/2 tablet daily      . famotidine (PEPCID) 20 MG tablet Take 1 tablet (20 mg total) by mouth 2 (two) times daily.  180 tablet  3  . latanoprost (XALATAN) 0.005 % ophthalmic solution Place 1 drop into both eyes at  bedtime.       Marland Kitchen levothyroxine (SYNTHROID, LEVOTHROID) 100 MCG tablet 1 tablet on Sunday, Tuesday, Thursday, and Saturday.  1 and 1/2 tablets on Monday, Wednesday, and Friday.  145 tablet  3  . liothyronine (CYTOMEL) 25 MCG tablet Take 1 tablet (25 mcg total) by mouth every morning. 1 tablet 6 days a week. Pt does not take on sunday  90 tablet  3  . metFORMIN (GLUCOPHAGE) 1000 MG tablet Take 1000 mg daily  90 tablet  3  . metoprolol succinate (TOPROL-XL) 25 MG 24 hr tablet Take 1 tablet (25 mg total) by mouth every morning.  90 tablet  3  . multivitamin-lutein (OCUVITE-LUTEIN) CAPS Take 1 capsule by mouth daily.      . sildenafil (REVATIO) 20 MG tablet Take 20 mg by mouth as needed.      . zolpidem (AMBIEN) 5 MG tablet Take 1 tablet (5 mg total) by mouth at bedtime as needed for sleep.  90 tablet  1  . [DISCONTINUED] warfarin (COUMADIN) 3 MG tablet Take 1 tablet (3 mg total) by mouth one time only at 6 PM.  35 tablet  0   No current facility-administered medications for this visit.    Allergies  Allergen  Reactions  . Lipitor [Atorvastatin Calcium]     NOT and allergy  . Zetia [Ezetimibe]     NOT an allergy    Patient Active Problem List   Diagnosis Date Noted  . Preventative health care 03/30/2013  . Impaired vision in both eyes 03/30/2013  . Erectile dysfunction 03/30/2013  . HTN (hypertension) 03/30/2013  . Skin cancer   . Hyperlipidemia   . Chronic cough 03/10/2013  . First degree heart block 01/04/2013  . DM type 2 with diabetic peripheral neuropathy 01/04/2013  . BPH (benign prostatic hyperplasia) 01/04/2013  . Hx of transient ischemic attack (TIA) 01/04/2013  . Palpitations 01/04/2013  . Glaucoma 01/04/2013  . Osteoarthritis of right knee 05/12/2011  . Hypothyroidism 12/25/2010  . Benign hypertensive heart disease without heart failure 12/25/2010  . Dyslipidemia 12/25/2010  . Anxiety and depression 12/25/2010  . Macular degeneration of both eyes 12/25/2010    History   Smoking status  . Never Smoker   Smokeless tobacco  . Never Used    History  Alcohol Use  . 4.2 oz/week  . 7 Shots of liquor per week    Comment: 2 drinks most days    Family History  Problem Relation Age of Onset  . Hypertension Mother   . Arthritis Mother   . Diabetes Mother   . Hypertension Father   . Stroke Father   . Kidney disease Father   . Hypertension Brother   . Diabetes Brother     Review of Systems: Constitutional: no fever chills diaphoresis or fatigue or change in weight.  Head and neck: no hearing loss, no epistaxis, no photophobia or visual disturbance. Respiratory: No cough, shortness of breath or wheezing. Cardiovascular: No chest pain peripheral edema, palpitations. Gastrointestinal: No abdominal distention, no abdominal pain, no change in bowel habits hematochezia or melena. Genitourinary: No dysuria, no frequency, no urgency, no nocturia. Musculoskeletal:No arthralgias, no back pain, no gait disturbance or myalgias. Neurological: No dizziness, no headaches, no numbness, no seizures, no syncope, no weakness, no tremors. Hematologic: No lymphadenopathy, no easy bruising. Psychiatric: No confusion, no hallucinations, no sleep disturbance.    Physical Exam: Filed Vitals:   09/06/13 0803  BP: 155/66  Pulse: 57   The general appearance reveals a well-developed well-nourished gentleman in no distress.The head and neck exam reveals pupils equal and reactive.  Extraocular movements are full.  There is no scleral icterus.  The mouth and pharynx are normal.  The neck is supple.  The carotids reveal no bruits.  The jugular venous pressure is normal.  The  thyroid is not enlarged.  There is no lymphadenopathy.  The chest is clear to percussion and auscultation.  There are no rales or rhonchi.  Expansion of the chest is symmetrical.  The precordium is quiet.  The first heart sound is normal.  The second heart sound is physiologically split.  There is no murmur  gallop rub or click.  There is no abnormal lift or heave.  The abdomen is soft and nontender.  The bowel sounds are normal.  The liver and spleen are not enlarged.  There are no abdominal masses.  There are no abdominal bruits.  Extremities reveal good pedal pulses.  There is no phlebitis or edema.  There is no cyanosis or clubbing.  Strength is normal and symmetrical in all extremities.  There is no lateralizing weakness.  There are no sensory deficits.  The skin is warm and dry.  There is no rash.    Assessment /  Plan: Continue same medication.  Continue metformin 1000 mg daily.  Needs to be more strict with his diet regarding his diabetes. Recheck in 4 months for office visit lipid panel hepatic function panel basal metabolic panel Z8H, free T4, TSH, digoxin level Encouraged him to try to get as much outdoor exercise as possible.  This will help him physically as well as emotionally to get out of the house.

## 2013-09-06 NOTE — Assessment & Plan Note (Signed)
The patient is clinically euthyroid. 

## 2013-09-06 NOTE — Patient Instructions (Signed)
Your physician recommends that you continue on your current medications as directed. Please refer to the Current Medication list given to you today.  Your physician wants you to follow-up in: 4 months with fasting labs (lp/bmet/hfp/tsh/ft4/dig level/a1c)  You will receive a reminder letter in the mail two months in advance. If you Stephen Wilkinson't receive a letter, please call our office to schedule the follow-up appointment.

## 2013-09-20 DIAGNOSIS — H35059 Retinal neovascularization, unspecified, unspecified eye: Secondary | ICD-10-CM | POA: Diagnosis not present

## 2013-09-20 DIAGNOSIS — H353290 Exudative age-related macular degeneration, unspecified eye, stage unspecified: Secondary | ICD-10-CM | POA: Diagnosis not present

## 2013-09-22 DIAGNOSIS — L821 Other seborrheic keratosis: Secondary | ICD-10-CM | POA: Diagnosis not present

## 2013-09-22 DIAGNOSIS — L570 Actinic keratosis: Secondary | ICD-10-CM | POA: Diagnosis not present

## 2013-09-22 DIAGNOSIS — Z85828 Personal history of other malignant neoplasm of skin: Secondary | ICD-10-CM | POA: Diagnosis not present

## 2013-09-22 DIAGNOSIS — L910 Hypertrophic scar: Secondary | ICD-10-CM | POA: Diagnosis not present

## 2013-09-29 ENCOUNTER — Ambulatory Visit: Payer: Medicare Other | Admitting: Internal Medicine

## 2013-09-29 DIAGNOSIS — H40059 Ocular hypertension, unspecified eye: Secondary | ICD-10-CM | POA: Diagnosis not present

## 2013-09-29 DIAGNOSIS — H2510 Age-related nuclear cataract, unspecified eye: Secondary | ICD-10-CM | POA: Diagnosis not present

## 2013-10-18 ENCOUNTER — Encounter: Payer: Self-pay | Admitting: Family Medicine

## 2013-10-18 ENCOUNTER — Encounter: Payer: Self-pay | Admitting: Internal Medicine

## 2013-10-18 ENCOUNTER — Ambulatory Visit (INDEPENDENT_AMBULATORY_CARE_PROVIDER_SITE_OTHER): Payer: Medicare Other | Admitting: Family Medicine

## 2013-10-18 ENCOUNTER — Other Ambulatory Visit (INDEPENDENT_AMBULATORY_CARE_PROVIDER_SITE_OTHER): Payer: Medicare Other

## 2013-10-18 ENCOUNTER — Ambulatory Visit (INDEPENDENT_AMBULATORY_CARE_PROVIDER_SITE_OTHER): Payer: Medicare Other | Admitting: Internal Medicine

## 2013-10-18 VITALS — BP 130/82 | HR 59 | Temp 98.3°F | Ht 73.5 in | Wt 175.1 lb

## 2013-10-18 VITALS — BP 130/82 | HR 59 | Wt 175.0 lb

## 2013-10-18 DIAGNOSIS — Z23 Encounter for immunization: Secondary | ICD-10-CM | POA: Diagnosis not present

## 2013-10-18 DIAGNOSIS — I10 Essential (primary) hypertension: Secondary | ICD-10-CM

## 2013-10-18 DIAGNOSIS — F341 Dysthymic disorder: Secondary | ICD-10-CM | POA: Diagnosis not present

## 2013-10-18 DIAGNOSIS — H9190 Unspecified hearing loss, unspecified ear: Secondary | ICD-10-CM

## 2013-10-18 DIAGNOSIS — F329 Major depressive disorder, single episode, unspecified: Secondary | ICD-10-CM

## 2013-10-18 DIAGNOSIS — M19039 Primary osteoarthritis, unspecified wrist: Secondary | ICD-10-CM | POA: Diagnosis not present

## 2013-10-18 DIAGNOSIS — F32A Depression, unspecified: Secondary | ICD-10-CM

## 2013-10-18 DIAGNOSIS — H9193 Unspecified hearing loss, bilateral: Secondary | ICD-10-CM

## 2013-10-18 DIAGNOSIS — E785 Hyperlipidemia, unspecified: Secondary | ICD-10-CM | POA: Diagnosis not present

## 2013-10-18 DIAGNOSIS — E039 Hypothyroidism, unspecified: Secondary | ICD-10-CM

## 2013-10-18 DIAGNOSIS — F419 Anxiety disorder, unspecified: Secondary | ICD-10-CM

## 2013-10-18 LAB — URINALYSIS, ROUTINE W REFLEX MICROSCOPIC
BILIRUBIN URINE: NEGATIVE
Hgb urine dipstick: NEGATIVE
KETONES UR: NEGATIVE
LEUKOCYTES UA: NEGATIVE
NITRITE: NEGATIVE
PH: 6 (ref 5.0–8.0)
RBC / HPF: NONE SEEN (ref 0–?)
SPECIFIC GRAVITY, URINE: 1.01 (ref 1.000–1.030)
Total Protein, Urine: NEGATIVE
UROBILINOGEN UA: 0.2 (ref 0.0–1.0)
Urine Glucose: NEGATIVE
WBC, UA: NONE SEEN (ref 0–?)

## 2013-10-18 MED ORDER — SILDENAFIL CITRATE 100 MG PO TABS: 50.0000 mg | ORAL_TABLET | Freq: Every day | ORAL | Status: DC | PRN

## 2013-10-18 MED ORDER — CITALOPRAM HYDROBROMIDE 10 MG PO TABS: 10.0000 mg | ORAL_TABLET | Freq: Every day | ORAL | Status: DC

## 2013-10-18 MED ORDER — SILDENAFIL CITRATE 20 MG PO TABS: ORAL_TABLET | ORAL | Status: DC

## 2013-10-18 MED ORDER — SILDENAFIL CITRATE 100 MG PO TABS
50.0000 mg | ORAL_TABLET | Freq: Every day | ORAL | Status: DC | PRN
Start: 2013-10-18 — End: 2013-10-31

## 2013-10-18 MED ORDER — LEVOTHYROXINE SODIUM 100 MCG PO TABS: ORAL_TABLET | ORAL | Status: DC

## 2013-10-18 NOTE — Patient Instructions (Addendum)
You had the new Prevnar pneumonia shot today  Your ears were irrigated of wax today  Please continue all other medications as before, and the viagra refill was sent to the pharmacy, and thyroid medication given in hardcopy  Please have the pharmacy call with any other refills you may need.  Please continue your efforts at being more active, low cholesterol diet, and weight control.  You are otherwise up to date with prevention measures today.  Please go to the LAB in the Basement (turn left off the elevator) for the tests to be done today - just the urinalysis today  You will be contacted by phone if any changes need to be made immediately.  Otherwise, you will receive a letter about your results with an explanation, but please check with MyChart first.  Please remember to sign up for MyChart if you have not done so, as this will be important to you in the future with finding out test results, communicating by private email, and scheduling acute appointments online when needed.  Please keep your appointments with your specialists as you have planned  Please see the scheduling desk as you leave for a Dr Smith/Sport Medicine appt this afternoon for the wrist pains  Please return in 6 months, or sooner if needed

## 2013-10-18 NOTE — Assessment & Plan Note (Signed)
stable overall by history and exam, recent data reviewed with pt, and pt to continue medical treatment as before,  to f/u any worsening symptoms or concerns Lab Results  Component Value Date   LDLCALC 103* 09/01/2013   Also for Ua today per pt request

## 2013-10-18 NOTE — Addendum Note (Signed)
Addended by: Biagio Borg on: 10/18/2013 10:37 AM   Modules accepted: Orders

## 2013-10-18 NOTE — Progress Notes (Signed)
Subjective:    Patient ID: Stephen Wilkinson, male    DOB: 09/23/1928, 80 y.o.   MRN: [id number]  HPI  Here to f/u, currently being separated from wife (natalie Riviera), she had recent back surgury, has left the home.  Also states has macular degeneration, slowed for a time, but now with worsening on top of glaucoma and cataract, may have surgury soon.  Denies worsening depressive symptoms, suicidal ideation, or panic; but has ongoing anxiety, much increased recently.  Feels he needs more overall medical support, but is somewhat vague except laments the state of medical care in the [place] now where coordination of care is overall poor.  Getting more confused, and wants more health management oversight.  Still wants to see Dr Mare Ferrari /card regularly as well.  Not clear to me if he wants to stop seeing other specisalist due to the confusion with understanding his care, such as seeing Dr Lamonte Sakai.Marland Kitchen  Also mentions left wrist pain with cutting trees, mentions a friend told him about Dr Nicola Police surgury.    Also with recent wt loss , but wt goes up and down, often with more or less stress.  Pt denies new neurological symptoms such as new headache, or facial or extremity weakness or numbness   Pt denies polydipsia, polyuria, or low sugar symptoms such as weakness or confusion improved with po intake.  Pt states overall good compliance with meds, trying to follow lower cholesterol, diabetic diet, though admits some occas dietary indiscretion. Pt denies chest pain, increased sob or doe, wheezing, orthopnea, PND, increased LE swelling, palpitations, dizziness or syncope Has bilat hearing loss worsening in past 2 wks, also due for prevnar Past Medical History  Diagnosis Date  . Hypertension   . Hyperlipidemia   . Diabetes mellitus   . Hypothyroidism   . Macular degeneration   . OA (osteoarthritis)   . Anxiety   . Depression   . PVC's (premature ventricular contractions)   . Hx: UTI (urinary tract infection)   . Insomnia    . Stroke     mid 1990s   . Pneumonia   . Chronic kidney disease     hx of uti due to catheter hx of prostatitis, BPH -   . Cancer     basal and squamous skin carcinoma   . Cataract   . Glaucoma   . Blood transfusion without reported diagnosis   . Altered mental status 05/15/2011  . Hypokalemia 05/15/2011  . Acute blood loss anemia 05/15/2011    post surgical  . Glaucoma 01/04/2013  . Hx of transient ischemic attack (TIA) 01/04/2013  . Macular degeneration   . HTN (hypertension) 03/30/2013  . Skin cancer   . Diabetes type 2, controlled   . Hyperlipidemia    Past Surgical History  Procedure Laterality Date  . Replacement total knee    . Thyroidectomy    . Appendectomy    . Tonsillectomy and adenoidectomy    . Inguinal hernia repair    . US echocardiography  11/10/2006    EF 55-60%  . US echocardiography  09/20/2002    EF 65-70%  . Other surgical history      microwave procedure for prostate - 09/12   . Total knee arthroplasty  05/11/2011    Procedure: TOTAL KNEE ARTHROPLASTY;  Surgeon: Gearlean Alf;  Location: [facility];  Service: Orthopedics;  Laterality: Right;  . Prostate ablation      microwave    reports that he has never smoked.  He has never used smokeless tobacco. He reports that he drinks about 4.2 ounces of alcohol per week. He reports that he does not use illicit drugs. family history includes Arthritis in his mother; Diabetes in his brother and mother; Hypertension in his brother, father, and mother; Kidney disease in his father; Stroke in his father. Allergies  Allergen Reactions  . Lipitor [Atorvastatin Calcium]     NOT and allergy  . Zetia [Ezetimibe]     NOT an allergy   Current Outpatient Prescriptions on File Prior to Visit  Medication Sig Dispense Refill  . ALPRAZolam (XANAX) 0.25 MG tablet Take 1 tablet (0.25 mg total) by mouth 2 (two) times daily.  180 tablet  1  . amLODipine (NORVASC) 5 MG tablet Take 1 tablet (5 mg total) by mouth every morning.   90 tablet  3  . aspirin 81 MG chewable tablet Chew 81 mg by mouth daily.      . Bevacizumab (AVASTIN IV) Place into the right eye as directed. Every 5 weeks      . clopidogrel (PLAVIX) 75 MG tablet Take 1 tablet (75 mg total) by mouth daily.  90 tablet  3  . digoxin (LANOXIN) 0.25 MG tablet 1/2 tablet daily      . famotidine (PEPCID) 20 MG tablet Take 1 tablet (20 mg total) by mouth 2 (two) times daily.  180 tablet  3  . latanoprost (XALATAN) 0.005 % ophthalmic solution Place 1 drop into both eyes at bedtime.       Marland Kitchen levothyroxine (SYNTHROID, LEVOTHROID) 100 MCG tablet 1 tablet on Sunday, Tuesday, Thursday, and Saturday.  1 and 1/2 tablets on Monday, Wednesday, and Friday.  145 tablet  3  . liothyronine (CYTOMEL) 25 MCG tablet Take 1 tablet (25 mcg total) by mouth every morning. 1 tablet 6 days a week. Pt does not take on sunday  90 tablet  3  . metFORMIN (GLUCOPHAGE) 1000 MG tablet Take 1000 mg daily  90 tablet  3  . metoprolol succinate (TOPROL-XL) 25 MG 24 hr tablet Take 1 tablet (25 mg total) by mouth every morning.  90 tablet  3  . multivitamin-lutein (OCUVITE-LUTEIN) CAPS Take 1 capsule by mouth daily.      . sildenafil (REVATIO) 20 MG tablet Take 20 mg by mouth as needed.      . zolpidem (AMBIEN) 5 MG tablet Take 1 tablet (5 mg total) by mouth at bedtime as needed for sleep.  90 tablet  1  . [DISCONTINUED] warfarin (COUMADIN) 3 MG tablet Take 1 tablet (3 mg total) by mouth one time only at 6 PM.  35 tablet  0   No current facility-administered medications on file prior to visit.   Off coumadin, now on plavix. Review of Systems Constitutional: Negative for increased diaphoresis, other activity, appetite or other siginficant weight change  HENT: Negative for worsening hearing loss, ear pain, facial swelling, mouth sores and neck stiffness.   Eyes: Negative for other worsening pain, redness or visual disturbance.  Respiratory: Negative for shortness of breath and wheezing.     Cardiovascular: Negative for chest pain and palpitations.  Gastrointestinal: Negative for diarrhea, blood in stool, abdominal distention or other pain Genitourinary: Negative for hematuria, flank pain or change in urine volume.  Musculoskeletal: Negative for myalgias or other joint complaints.  Skin: Negative for color change and wound.  Neurological: Negative for syncope and numbness. other than noted Hematological: Negative for adenopathy. or other swelling Psychiatric/Behavioral: Negative for hallucinations, self-injury, decreased concentration  or other worsening agitation. but 2+ nervous today, mild dysphoric     Objective:   Physical Exam BP 130/82  Pulse 59  Temp(Src) 98.3 F (36.8 C) (Oral)  Ht 6' 1.5" (1.867 m)  Wt 175 lb 2 oz (79.436 kg)  BMI 22.79 kg/m2  SpO2 98% VS noted,  Constitutional: Pt is oriented to person, place, and time. Appears well-developed and well-nourished.  Head: Normocephalic and atraumatic.  Right Ear: External ear normal.  Left Ear: External ear normal.  Nose: Nose normal.  Mouth/Throat: Oropharynx is clear and moist.  Eyes: Conjunctivae and EOM are normal. Pupils are equal, round, and reactive to light.  Neck: Normal range of motion. Neck supple. No JVD present. No tracheal deviation present.  Cardiovascular: Normal rate, regular rhythm, normal heart sounds and intact distal pulses.   Pulmonary/Chest: Effort normal and breath sounds without rales or wheezing  Abdominal: Soft. Bowel sounds are normal. NT. No HSM  Musculoskeletal: Normal range of motion. Exhibits no edema.  Lymphadenopathy:  Has no cervical adenopathy.  Neurological: Pt is alert and oriented to person, place, and time. Pt has normal reflexes. No cranial nerve deficit. Motor grossly intact Skin: Skin is warm and dry. No rash noted.  Psychiatric:  Has anxious/dysphoric mood and affect. Behavior is normal. .  Assessment & Plan:

## 2013-10-18 NOTE — Addendum Note (Signed)
Addended by: Sharon Seller B on: 10/18/2013 10:54 AM   Modules accepted: Orders

## 2013-10-18 NOTE — Progress Notes (Signed)
  [facility] [address] [place], [place] [address] Phone: [phone] Subjective:    I'm seeing this patient by the request  of:  Cathlean Cower, MD   CC: Wrist pain  INO:[id number] Stephen Wilkinson is a 80 y.o. male coming in with complaint of wrist pain bilaterally. Patient states that this occurs more when he is doing more activity outside. Last year he had significant amount of pain in both wrists and he can stop doing his yard work. Patient would like to continue to do his yard work this year though. Patient states it does seem to be localized on the top of each wrist pointing towards the dorsal aspect. No radiation to the fingers and no radiation to the elbows. Patient denies any neck pain with pain. Describes it is more of a pain when he is doing certain activities such as using pruning shears. Patient has tried over-the-counter medicines without any significant improvement. Patient denies any nighttime awakening. Patient states at the moment he is not having any pain like to avoid having pain he had previously. One pin was at its worse patient had a severity of 7/10.     Past medical history, social, surgical and family history all reviewed in electronic medical record.   Review of Systems: No headache, visual changes, nausea, vomiting, diarrhea, constipation, dizziness, abdominal pain, skin rash, fevers, chills, night sweats, weight loss, swollen lymph nodes, body aches, joint swelling, muscle aches, chest pain, shortness of breath, mood changes.   Objective Blood pressure 130/82, pulse 59, weight 175 lb (79.379 kg), SpO2 98.00%.  General: No apparent distress alert and oriented x3 mood and affect normal, dressed appropriately.  HEENT: Pupils equal, extraocular movements intact  Respiratory: Patient's speak in full sentences and does not appear short of breath  Cardiovascular: No lower extremity edema, non tender, no erythema  Skin: Warm dry intact with no signs  of infection or rash on extremities or on axial skeleton.  Abdomen: Soft nontender  Neuro: Cranial nerves II through XII are intact, neurovascularly intact in all extremities with 2+ DTRs and 2+ pulses.  Lymph: No lymphadenopathy of posterior or anterior cervical chain or axillae bilaterally.  Gait normal with good balance and coordination.  MSK:  Non tender with full range of motion and good stability and symmetric strength and tone of shoulders, elbows,  hip, knee and ankles bilaterally. Significant osteoarthritic changes of multiple joints. Patient does have bilateral knee replacements. Wrist: Bilateral Inspection normal with no visible erythema or swelling. ROM is restricted lacking the last 5 of extension or dorsiflexion bilaterally. Mild crepitus with range of motion bilaterally Palpation is normal over metacarpals, navicular, lunate, and TFCC; tendons without tenderness/ swelling but once again significant osteoarthritic changes No snuffbox tenderness. No tenderness over Canal of [place]. Strength 5/5 in all directions without pain. Negative Finkelstein, tinel's and phalens. Negative Watson's test.    Impression and Recommendations:     This case required medical decision making of moderate complexity.

## 2013-10-18 NOTE — Assessment & Plan Note (Addendum)
Improved with wax irrigation, but still quite symptomatic and impacted, for ent referral

## 2013-10-18 NOTE — Assessment & Plan Note (Signed)
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 Patient given different exercises to do when causing some discomfort. We talked about over-the-counter medications it would be safe with patient's history of macular degeneration. We also discussed icing protocol I would be helpful. We also discussed augmenting how he uses his equipment that can be beneficial. Patient will try these changes and will followup on an as-needed basis. Patient continued to have trouble we may need to consider bracing.

## 2013-10-18 NOTE — Assessment & Plan Note (Signed)
stable overall by history and exam, recent data reviewed with pt, and pt to continue medical treatment as before,  to f/u any worsening symptoms or concerns BP Readings from Last 3 Encounters:  10/18/13 130/82  09/06/13 155/66  08/03/13 144/88

## 2013-10-18 NOTE — Assessment & Plan Note (Addendum)
Ok for celexa 10 qd, cont xanax prn, verified nonsuicidal, declines counseling referral  Note:  Total time for pt hx, exam, review of record with pt in the room, determination of diagnoses and plan for further eval and tx is > 40 min, with over 50% spent in coordination and counseling of patient

## 2013-10-18 NOTE — Patient Instructions (Signed)
Good to meet you Ice 20 minutes after exercise and working in the yard.  Capsaicin topically 2 times a day when giving you trouble.  Exercises 3 times a week if it starts giving you problems Come back when you need me.

## 2013-10-18 NOTE — Progress Notes (Signed)
Pre visit review using our clinic review tool, if applicable. No additional management support is needed unless otherwise documented below in the visit note. 

## 2013-10-25 DIAGNOSIS — H353290 Exudative age-related macular degeneration, unspecified eye, stage unspecified: Secondary | ICD-10-CM | POA: Diagnosis not present

## 2013-10-25 DIAGNOSIS — H35059 Retinal neovascularization, unspecified, unspecified eye: Secondary | ICD-10-CM | POA: Diagnosis not present

## 2013-10-31 ENCOUNTER — Encounter: Payer: Self-pay | Admitting: Emergency Medicine

## 2013-10-31 ENCOUNTER — Ambulatory Visit (INDEPENDENT_AMBULATORY_CARE_PROVIDER_SITE_OTHER): Payer: Medicare Other | Admitting: Emergency Medicine

## 2013-10-31 VITALS — BP 140/88 | HR 59 | Ht 73.0 in | Wt 175.4 lb

## 2013-10-31 DIAGNOSIS — R05 Cough: Secondary | ICD-10-CM | POA: Diagnosis not present

## 2013-10-31 DIAGNOSIS — R059 Cough, unspecified: Secondary | ICD-10-CM

## 2013-10-31 DIAGNOSIS — R053 Chronic cough: Secondary | ICD-10-CM

## 2013-10-31 NOTE — Patient Instructions (Signed)
I agree with an evaluation by Gastroenterology for your GERD If your cough returns we will need to start treating your allergic rhinitis. Please contact Dr Lamonte Sakai if you develop new symptoms.

## 2013-10-31 NOTE — Progress Notes (Signed)
  Subjective:    Patient ID: Stephen Wilkinson, male    DOB: 13-Oct-1928, 80 y.o.   MRN: [id number]  HPI 80 yo never smoker w hx HTN, hyperlipidemia, DM, remote CVA, macular degeneration. He is referred by Dr Mariel Craft for eval of chronic cough, chest congestion. This has been a problem now for about 3-4 years, happens off/on although not necessarily seasonal. He does have sneezing, nasal congestion at the times when this is flaring. Seems to be quiet right now. Bothers him especially when laying flat. The cough is mostly non-productive. He feels a pressure in his chest at these times. Can be associated with wheezing.    He is on pepcid, has been on nexium before but didn't tolerate. He has rare breakthrough GERD on the nexium.   ROV 10/31/13 -- follows for cough and chest congestion. When we first met his cough was quiet, but today he reports that he has been under a lot more stress and is having a lot more GERD. The increased GERD hasn't seemed to lead to more cough. He hasn't been outside very often this year and his allergic cough hasn't returned. He has to have an eye surgery soon for cataract superimposed on macular degeneration.      Review of Systems  Constitutional: Positive for unexpected weight change. Negative for fever.  HENT: Positive for congestion, postnasal drip and sneezing. Negative for dental problem, ear pain, nosebleeds, rhinorrhea, sinus pressure, sore throat and trouble swallowing.   Eyes: Negative for redness and itching.  Respiratory: Positive for cough, chest tightness and wheezing. Negative for shortness of breath.   Cardiovascular: Negative for palpitations and leg swelling.  Gastrointestinal: Negative for nausea and vomiting.  Genitourinary: Negative for dysuria.  Musculoskeletal: Negative for joint swelling.  Skin: Negative for rash.  Neurological: Negative for headaches.  Hematological: Does not bruise/bleed easily.  Psychiatric/Behavioral: Negative for dysphoric mood. The  patient is not nervous/anxious.      Objective:   Physical Exam Filed Vitals:   10/31/13 1054  BP: 140/88  Pulse: 59  Height: $Remove'6\' 1"'QamNDPp$  (1.854 m)  Weight: 175 lb 6.4 oz (79.561 kg)  SpO2: 100%   Gen: Pleasant, well-nourished, in no distress,  normal affect  ENT: No lesions,  mouth clear,  oropharynx clear, no postnasal drip  Neck: No JVD, no TMG, no carotid bruits  Lungs: No use of accessory muscles, no dullness to percussion, clear without rales or rhonchi  Cardiovascular: RRR, heart sounds normal, no murmur or gallops, no peripheral edema  Musculoskeletal: No deformities, no cyanosis or clubbing  Neuro: alert, non focal  Skin: Warm, no lesions or rashes      Assessment & Plan:  Chronic cough Remains stable at this time despite a flare in his GERD. Suspect that allergies are a larger factor than I initially thought - he hasn't been outside much this season.  - he will contact me if / when he starts coughing - GI eval for his gerd, especially since he has had difficulty tolerating PPI's in the past.

## 2013-10-31 NOTE — Assessment & Plan Note (Signed)
Remains stable at this time despite a flare in his GERD. Suspect that allergies are a larger factor than I initially thought - he hasn't been outside much this season.  - he will contact me if / when he starts coughing - GI eval for his gerd, especially since he has had difficulty tolerating PPI's in the past.

## 2013-11-02 ENCOUNTER — Ambulatory Visit (INDEPENDENT_AMBULATORY_CARE_PROVIDER_SITE_OTHER): Payer: Medicare Other | Admitting: Podiatrist

## 2013-11-02 ENCOUNTER — Encounter: Payer: Self-pay | Admitting: Podiatrist

## 2013-11-02 VITALS — BP 134/86 | HR 86 | Resp 12

## 2013-11-02 DIAGNOSIS — M79609 Pain in unspecified limb: Secondary | ICD-10-CM

## 2013-11-02 DIAGNOSIS — B351 Tinea unguium: Secondary | ICD-10-CM

## 2013-11-03 ENCOUNTER — Telehealth: Payer: Self-pay | Admitting: *Deleted

## 2013-11-03 NOTE — Telephone Encounter (Signed)
Patient called and requested that you call him back about the [place]. I believe he was trying to tell me that he needs a quantity exception, but we never received a fax about this and he stated they were supposed to send it today. Please advise. Thanks, MI

## 2013-11-03 NOTE — Telephone Encounter (Signed)
Will look for form to be faxed, patient takes Ambien 5 mg 1 to 2 hs prn  Also needs Rx for Xanax faxed

## 2013-11-04 NOTE — Progress Notes (Signed)
HPI: Patient presents today for follow up of foot and nail care. He is also here to pick up his diabetic inserts for his existing new balance shoes he prefers to purchase from the shoe market. Denies any new complaints today. He does continue to have neuropathy and states his mother and grandmother ended up with amputations due to circulation and neuropathy.  Objective: Patients chart is reviewed. Neurovascular status unchanged. Patients nails are thickened, discolored, distrophic, friable and brittle with yellow-brown discoloration. Patient subjectively relates they are painful with shoes and with ambulation of bilateral feet. He also has intractable porokeratotic lesions submet 5 right and submet 1 right. Pedal pulses palpable and strong bilateral  Assessment: Symptomatic onychomycosis, diabetes with neuropathy, plantar flexed metatarsals, pororkeratotic lesions x 2  Plan: Discussed treatment options and alternatives. The symptomatic toenails were debrided through manual an mechanical means without complication. The lesions were debrided without complication.   

## 2013-11-07 ENCOUNTER — Encounter (HOSPITAL_COMMUNITY): Payer: Self-pay | Admitting: Emergency Medicine

## 2013-11-07 ENCOUNTER — Emergency Department (HOSPITAL_COMMUNITY)
Admission: EM | Admit: 2013-11-07 | Discharge: 2013-11-07 | Disposition: A | Discharge status: Home / Self Care | Payer: Medicare Other | Attending: Emergency Medicine | Admitting: Emergency Medicine

## 2013-11-07 ENCOUNTER — Emergency Department (HOSPITAL_COMMUNITY): Payer: Medicare Other

## 2013-11-07 DIAGNOSIS — E785 Hyperlipidemia, unspecified: Secondary | ICD-10-CM | POA: Insufficient documentation

## 2013-11-07 DIAGNOSIS — G4700 Insomnia, unspecified: Secondary | ICD-10-CM | POA: Diagnosis not present

## 2013-11-07 DIAGNOSIS — IMO0002 Reserved for concepts with insufficient information to code with codable children: Secondary | ICD-10-CM | POA: Insufficient documentation

## 2013-11-07 DIAGNOSIS — Z8673 Personal history of transient ischemic attack (TIA), and cerebral infarction without residual deficits: Secondary | ICD-10-CM | POA: Insufficient documentation

## 2013-11-07 DIAGNOSIS — I10 Essential (primary) hypertension: Secondary | ICD-10-CM | POA: Diagnosis not present

## 2013-11-07 DIAGNOSIS — Z85828 Personal history of other malignant neoplasm of skin: Secondary | ICD-10-CM | POA: Diagnosis not present

## 2013-11-07 DIAGNOSIS — F411 Generalized anxiety disorder: Secondary | ICD-10-CM | POA: Insufficient documentation

## 2013-11-07 DIAGNOSIS — T07XXXA Unspecified multiple injuries, initial encounter: Secondary | ICD-10-CM | POA: Diagnosis not present

## 2013-11-07 DIAGNOSIS — M1990 Unspecified osteoarthritis, unspecified site: Secondary | ICD-10-CM | POA: Insufficient documentation

## 2013-11-07 DIAGNOSIS — S300XXA Contusion of lower back and pelvis, initial encounter: Secondary | ICD-10-CM | POA: Diagnosis not present

## 2013-11-07 DIAGNOSIS — N189 Chronic kidney disease, unspecified: Secondary | ICD-10-CM | POA: Diagnosis not present

## 2013-11-07 DIAGNOSIS — F3289 Other specified depressive episodes: Secondary | ICD-10-CM | POA: Insufficient documentation

## 2013-11-07 DIAGNOSIS — Z7902 Long term (current) use of antithrombotics/antiplatelets: Secondary | ICD-10-CM | POA: Insufficient documentation

## 2013-11-07 DIAGNOSIS — Y9289 Other specified places as the place of occurrence of the external cause: Secondary | ICD-10-CM | POA: Insufficient documentation

## 2013-11-07 DIAGNOSIS — S7002XA Contusion of left hip, initial encounter: Secondary | ICD-10-CM

## 2013-11-07 DIAGNOSIS — F329 Major depressive disorder, single episode, unspecified: Secondary | ICD-10-CM | POA: Insufficient documentation

## 2013-11-07 DIAGNOSIS — E119 Type 2 diabetes mellitus without complications: Secondary | ICD-10-CM | POA: Diagnosis not present

## 2013-11-07 DIAGNOSIS — Z87440 Personal history of urinary (tract) infections: Secondary | ICD-10-CM | POA: Insufficient documentation

## 2013-11-07 DIAGNOSIS — M25559 Pain in unspecified hip: Secondary | ICD-10-CM | POA: Diagnosis not present

## 2013-11-07 DIAGNOSIS — Z862 Personal history of diseases of the blood and blood-forming organs and certain disorders involving the immune mechanism: Secondary | ICD-10-CM | POA: Insufficient documentation

## 2013-11-07 DIAGNOSIS — S79929A Unspecified injury of unspecified thigh, initial encounter: Secondary | ICD-10-CM | POA: Diagnosis not present

## 2013-11-07 DIAGNOSIS — X500XXA Overexertion from strenuous movement or load, initial encounter: Secondary | ICD-10-CM | POA: Insufficient documentation

## 2013-11-07 DIAGNOSIS — S7000XA Contusion of unspecified hip, initial encounter: Secondary | ICD-10-CM | POA: Diagnosis not present

## 2013-11-07 DIAGNOSIS — S59909A Unspecified injury of unspecified elbow, initial encounter: Secondary | ICD-10-CM | POA: Diagnosis not present

## 2013-11-07 DIAGNOSIS — Z79899 Other long term (current) drug therapy: Secondary | ICD-10-CM | POA: Diagnosis not present

## 2013-11-07 DIAGNOSIS — Z96659 Presence of unspecified artificial knee joint: Secondary | ICD-10-CM | POA: Insufficient documentation

## 2013-11-07 DIAGNOSIS — Y9389 Activity, other specified: Secondary | ICD-10-CM | POA: Insufficient documentation

## 2013-11-07 DIAGNOSIS — I129 Hypertensive chronic kidney disease with stage 1 through stage 4 chronic kidney disease, or unspecified chronic kidney disease: Secondary | ICD-10-CM | POA: Insufficient documentation

## 2013-11-07 DIAGNOSIS — E039 Hypothyroidism, unspecified: Secondary | ICD-10-CM | POA: Insufficient documentation

## 2013-11-07 DIAGNOSIS — Z8669 Personal history of other diseases of the nervous system and sense organs: Secondary | ICD-10-CM | POA: Insufficient documentation

## 2013-11-07 DIAGNOSIS — H3530 Unspecified macular degeneration: Secondary | ICD-10-CM | POA: Diagnosis not present

## 2013-11-07 DIAGNOSIS — Z8701 Personal history of pneumonia (recurrent): Secondary | ICD-10-CM | POA: Insufficient documentation

## 2013-11-07 DIAGNOSIS — W010XXA Fall on same level from slipping, tripping and stumbling without subsequent striking against object, initial encounter: Secondary | ICD-10-CM | POA: Insufficient documentation

## 2013-11-07 DIAGNOSIS — Z7982 Long term (current) use of aspirin: Secondary | ICD-10-CM | POA: Diagnosis not present

## 2013-11-07 DIAGNOSIS — Z982 Presence of cerebrospinal fluid drainage device: Secondary | ICD-10-CM | POA: Insufficient documentation

## 2013-11-07 DIAGNOSIS — S79919A Unspecified injury of unspecified hip, initial encounter: Secondary | ICD-10-CM | POA: Diagnosis not present

## 2013-11-07 DIAGNOSIS — S6990XA Unspecified injury of unspecified wrist, hand and finger(s), initial encounter: Secondary | ICD-10-CM | POA: Diagnosis not present

## 2013-11-07 MED ORDER — CYCLOBENZAPRINE HCL 10 MG PO TABS: 10.0000 mg | ORAL_TABLET | Freq: Three times a day (TID) | ORAL | Status: DC | PRN

## 2013-11-07 MED ORDER — OXYCODONE-ACETAMINOPHEN 5-325 MG PO TABS: 1.0000 | ORAL_TABLET | ORAL | Status: DC | PRN

## 2013-11-07 MED ORDER — OXYCODONE-ACETAMINOPHEN 5-325 MG PO TABS
2.0000 | ORAL_TABLET | Freq: Once | ORAL | Status: AC
  Administered 2013-11-07: 2 via ORAL
  Filled 2013-11-07: qty 2

## 2013-11-07 NOTE — ED Notes (Signed)
[place] via [facility] [place], with c/o stepped in a hole in a parking lot, fell onto left hip, was able to walk upstairs, but is c/o pain in left hip. Swelling to left hip area, abrasion to left elbow. Alert/oriented x 3

## 2013-11-07 NOTE — ED Notes (Signed)
Scheduled to have eye surgery Thursday. Has stopped metformin

## 2013-11-07 NOTE — ED Provider Notes (Signed)
CSN: [id number]     Arrival date & time 11/07/13  1224 History   First MD Initiated Contact with Patient 11/07/13 1227     Chief Complaint  Patient presents with  . Fall  . Hip Pain      Patient is a 80 y.o. male presenting with fall and hip pain. The history is provided by the patient.  Fall This is a new problem. Episode onset: just prior to arrival. The problem occurs constantly. The problem has not changed since onset.Pertinent negatives include no chest pain, no abdominal pain, no headaches and no shortness of breath. Exacerbated by: movement. The symptoms are relieved by rest. He has tried rest for the symptoms.  Hip Pain This is a new problem. Pertinent negatives include no chest pain, no abdominal pain, no headaches and no shortness of breath.  pt reports he tripped fell after stepping into a hole He reports landing on left elbow and left hip No head injury No neck pain No HA No cp/sob No LOC He reports pain and swelling to left hip.  He has been ambulating since the fall  He denies pain in left elbow  Past Medical History  Diagnosis Date  . Hypertension   . Hyperlipidemia   . Diabetes mellitus   . Hypothyroidism   . Macular degeneration   . OA (osteoarthritis)   . Anxiety   . Depression   . PVC's (premature ventricular contractions)   . Hx: UTI (urinary tract infection)   . Insomnia   . Stroke     mid 1990s   . Pneumonia   . Chronic kidney disease     hx of uti due to catheter hx of prostatitis, BPH -   . Cancer     basal and squamous skin carcinoma   . Cataract   . Glaucoma   . Blood transfusion without reported diagnosis   . Altered mental status 05/15/2011  . Hypokalemia 05/15/2011  . Acute blood loss anemia 05/15/2011    post surgical  . Glaucoma 01/04/2013  . Hx of transient ischemic attack (TIA) 01/04/2013  . Macular degeneration   . HTN (hypertension) 03/30/2013  . Skin cancer   . Diabetes type 2, controlled   . Hyperlipidemia    Past  Surgical History  Procedure Laterality Date  . Replacement total knee    . Thyroidectomy    . Appendectomy    . Tonsillectomy and adenoidectomy    . Inguinal hernia repair    . US echocardiography  11/10/2006    EF 55-60%  . US echocardiography  09/20/2002    EF 65-70%  . Other surgical history      microwave procedure for prostate - 09/12   . Total knee arthroplasty  05/11/2011    Procedure: TOTAL KNEE ARTHROPLASTY;  Surgeon: Gearlean Alf;  Location: [facility];  Service: Orthopedics;  Laterality: Right;  . Prostate ablation      microwave   Family History  Problem Relation Age of Onset  . Hypertension Mother   . Arthritis Mother   . Diabetes Mother   . Hypertension Father   . Stroke Father   . Kidney disease Father   . Hypertension Brother   . Diabetes Brother    History  Substance Use Topics  . Smoking status: Never Smoker   . Smokeless tobacco: Never Used  . Alcohol Use: 4.2 oz/week    7 Shots of liquor per week     Comment: 2 drinks most days  Review of Systems  Respiratory: Negative for shortness of breath.   Cardiovascular: Negative for chest pain.  Gastrointestinal: Negative for abdominal pain.  Neurological: Negative for dizziness and headaches.  All other systems reviewed and are negative.     Allergies  Lipitor and Zetia  Home Medications   Prior to Admission medications   Medication Sig Start Date End Date Taking? Authorizing Provider  ALPRAZolam (XANAX) 0.25 MG tablet Take 1 tablet (0.25 mg total) by mouth 2 (two) times daily. 05/02/13   Darlin Coco, MD  amLODipine (NORVASC) 5 MG tablet Take 1 tablet (5 mg total) by mouth every morning. 09/06/13   Darlin Coco, MD  aspirin 81 MG tablet Take 81 mg by mouth daily.    Historical Provider, MD  Bevacizumab (AVASTIN IV) Place into the right eye as directed. Every 5 weeks    Historical Provider, MD  clopidogrel (PLAVIX) 75 MG tablet Take 1 tablet (75 mg total) by mouth daily. 05/02/13   Darlin Coco, MD  digoxin (LANOXIN) 0.25 MG tablet 1/2 tablet daily 05/02/13   Darlin Coco, MD  famotidine (PEPCID) 20 MG tablet Take 1 tablet (20 mg total) by mouth 2 (two) times daily. 09/06/13   Darlin Coco, MD  latanoprost (XALATAN) 0.005 % ophthalmic solution Place 1 drop into both eyes at bedtime.     Historical Provider, MD  levothyroxine (SYNTHROID, LEVOTHROID) 100 MCG tablet 1 tablet on Sunday, Tuesday, Thursday, and Saturday.  1 and 1/2 tablets on Monday, Wednesday, and Friday. 10/18/13   Biagio Borg, MD  liothyronine (CYTOMEL) 25 MCG tablet Take 1 tablet (25 mcg total) by mouth every morning. 1 tablet 6 days a week. Pt does not take on sunday 05/02/13   Darlin Coco, MD  metFORMIN (GLUCOPHAGE) 1000 MG tablet Take 1000 mg daily 05/02/13   Darlin Coco, MD  metoprolol succinate (TOPROL-XL) 25 MG 24 hr tablet Take 1 tablet (25 mg total) by mouth every morning. 05/02/13   Darlin Coco, MD  multivitamin-lutein [facility]) CAPS Take 1 capsule by mouth daily.    Historical Provider, MD  sildenafil (REVATIO) 20 MG tablet Take 2-5 tablets by mouth once daily as needed. 10/18/13   Biagio Borg, MD  zolpidem (AMBIEN) 5 MG tablet Take 1 tablet (5 mg total) by mouth at bedtime as needed for sleep. 05/02/13   Darlin Coco, MD   BP 161/64  Pulse 62  Temp(Src) 98.3 F (36.8 C) (Oral)  Resp 16  SpO2 96% Physical Exam CONSTITUTIONAL: Well developed/well nourished HEAD: Normocephalic/atraumatic EYES: EOMI/PERRL ENMT: Mucous membranes moist NECK: supple no meningeal signs SPINE:entire spine nontender CV: S1/S2 noted, no murmurs/rubs/gallops noted LUNGS: Lungs are clear to auscultation bilaterally, no apparent distress ABDOMEN: soft, nontender, no rebound or guarding NEURO: Pt is awake/alert, moves all extremitiesx4 EXTREMITIES: pulses normal, full ROM.  Abrasions and swelling to left elbow  Large bruise to left buttock with tenderness.  He can range the left hip without  difficulty and no deformity noted All other extremities/joints palpated/ranged and nontender SKIN: warm, color normal PSYCH: no abnormalities of mood noted  ED Course  Procedures  NO ACUTE FRACTURES NOTED PT CAN AMBULATE, THOUGH LIMITED DUE TO SORENESS DISCUSSED STRICT RETURN PRECAUTIONS   Imaging Review Dg Elbow Complete Left  11/07/2013   CLINICAL DATA:  Fall.  EXAM: LEFT ELBOW - COMPLETE 3+ VIEW  COMPARISON:  None.  FINDINGS: There is no evidence of fracture, dislocation, or joint effusion. There is no evidence of arthropathy or other focal bone abnormality. Soft  tissues are unremarkable.  IMPRESSION: No acute bony or joint abnormality.   Electronically Signed   By: Marcello Moores  Register   On: 11/07/2013 14:05   Dg Hip Complete Left  11/07/2013   CLINICAL DATA:  Fall.  EXAM: LEFT HIP - COMPLETE 2+ VIEW  COMPARISON:  None.  FINDINGS: Degenerative changes lumbar spine and both hips. Vascular calcification noted. No acute abnormality. No evidence of fracture or dislocation.  IMPRESSION: No acute abnormality.   Electronically Signed   By: Marcello Moores  Register   On: 11/07/2013 14:06      MDM   Final diagnoses:  Contusion of left hip  Abrasions of multiple sites    Nursing notes including past medical history and social history reviewed and considered in documentation xrays reviewed and considered     Sharyon Cable, MD 11/07/13 1554

## 2013-11-07 NOTE — Discharge Instructions (Signed)
PLEASE RETURN IF YOU ARE UNABLE TO WALK, YOUR LEG NUMBNESS/DISCOLORED OR YOUR PAIN IS UNCONTROLLED  Contusion A contusion is a deep bruise. Contusions happen when an injury causes bleeding under the skin. Signs of bruising include pain, puffiness (swelling), and discolored skin. The contusion may turn blue, purple, or yellow. HOME CARE   Put ice on the injured area.  Put ice in a plastic bag.  Place a towel between your skin and the bag.  Leave the ice on for 15-20 minutes, 03-04 times a day.  Only take medicine as told by your doctor.  Rest the injured area.  If possible, raise (elevate) the injured area to lessen puffiness. GET HELP RIGHT AWAY IF:   You have more bruising or puffiness.  You have pain that is getting worse.  Your puffiness or pain is not helped by medicine. MAKE SURE YOU:   Understand these instructions.  Will watch your condition.  Will get help right away if you are not doing well or get worse. Document Released: 11/18/2007 Document Revised: 08/24/2011 Document Reviewed: 04/06/2011 [facility] Patient Information 2014 [place], [place].

## 2013-11-09 ENCOUNTER — Emergency Department (HOSPITAL_COMMUNITY)
Admission: EM | Admit: 2013-11-09 | Discharge: 2013-11-09 | Disposition: A | Discharge status: Home / Self Care | Payer: Medicare Other | Attending: Emergency Medicine | Admitting: Emergency Medicine

## 2013-11-09 ENCOUNTER — Encounter (HOSPITAL_COMMUNITY): Payer: Self-pay | Admitting: Emergency Medicine

## 2013-11-09 ENCOUNTER — Emergency Department (HOSPITAL_COMMUNITY): Payer: Medicare Other

## 2013-11-09 DIAGNOSIS — Y9389 Activity, other specified: Secondary | ICD-10-CM | POA: Insufficient documentation

## 2013-11-09 DIAGNOSIS — R58 Hemorrhage, not elsewhere classified: Secondary | ICD-10-CM | POA: Insufficient documentation

## 2013-11-09 DIAGNOSIS — IMO0002 Reserved for concepts with insufficient information to code with codable children: Secondary | ICD-10-CM | POA: Diagnosis not present

## 2013-11-09 DIAGNOSIS — F411 Generalized anxiety disorder: Secondary | ICD-10-CM | POA: Insufficient documentation

## 2013-11-09 DIAGNOSIS — F329 Major depressive disorder, single episode, unspecified: Secondary | ICD-10-CM | POA: Insufficient documentation

## 2013-11-09 DIAGNOSIS — S79919A Unspecified injury of unspecified hip, initial encounter: Secondary | ICD-10-CM | POA: Diagnosis not present

## 2013-11-09 DIAGNOSIS — E785 Hyperlipidemia, unspecified: Secondary | ICD-10-CM | POA: Insufficient documentation

## 2013-11-09 DIAGNOSIS — Y929 Unspecified place or not applicable: Secondary | ICD-10-CM | POA: Insufficient documentation

## 2013-11-09 DIAGNOSIS — I129 Hypertensive chronic kidney disease with stage 1 through stage 4 chronic kidney disease, or unspecified chronic kidney disease: Secondary | ICD-10-CM | POA: Insufficient documentation

## 2013-11-09 DIAGNOSIS — E039 Hypothyroidism, unspecified: Secondary | ICD-10-CM | POA: Diagnosis not present

## 2013-11-09 DIAGNOSIS — Z7982 Long term (current) use of aspirin: Secondary | ICD-10-CM | POA: Diagnosis not present

## 2013-11-09 DIAGNOSIS — I10 Essential (primary) hypertension: Secondary | ICD-10-CM | POA: Diagnosis not present

## 2013-11-09 DIAGNOSIS — R296 Repeated falls: Secondary | ICD-10-CM | POA: Insufficient documentation

## 2013-11-09 DIAGNOSIS — Z7902 Long term (current) use of antithrombotics/antiplatelets: Secondary | ICD-10-CM | POA: Insufficient documentation

## 2013-11-09 DIAGNOSIS — E119 Type 2 diabetes mellitus without complications: Secondary | ICD-10-CM | POA: Insufficient documentation

## 2013-11-09 DIAGNOSIS — H409 Unspecified glaucoma: Secondary | ICD-10-CM | POA: Insufficient documentation

## 2013-11-09 DIAGNOSIS — N189 Chronic kidney disease, unspecified: Secondary | ICD-10-CM | POA: Diagnosis not present

## 2013-11-09 DIAGNOSIS — Z8673 Personal history of transient ischemic attack (TIA), and cerebral infarction without residual deficits: Secondary | ICD-10-CM | POA: Insufficient documentation

## 2013-11-09 DIAGNOSIS — Z8701 Personal history of pneumonia (recurrent): Secondary | ICD-10-CM | POA: Diagnosis not present

## 2013-11-09 DIAGNOSIS — S3289XA Fracture of other parts of pelvis, initial encounter for closed fracture: Secondary | ICD-10-CM | POA: Diagnosis not present

## 2013-11-09 DIAGNOSIS — Z87440 Personal history of urinary (tract) infections: Secondary | ICD-10-CM | POA: Insufficient documentation

## 2013-11-09 DIAGNOSIS — G4700 Insomnia, unspecified: Secondary | ICD-10-CM | POA: Insufficient documentation

## 2013-11-09 DIAGNOSIS — S79929A Unspecified injury of unspecified thigh, initial encounter: Secondary | ICD-10-CM | POA: Diagnosis not present

## 2013-11-09 DIAGNOSIS — S329XXA Fracture of unspecified parts of lumbosacral spine and pelvis, initial encounter for closed fracture: Secondary | ICD-10-CM

## 2013-11-09 DIAGNOSIS — Z79899 Other long term (current) drug therapy: Secondary | ICD-10-CM | POA: Diagnosis not present

## 2013-11-09 DIAGNOSIS — M25559 Pain in unspecified hip: Secondary | ICD-10-CM | POA: Diagnosis not present

## 2013-11-09 DIAGNOSIS — Z85828 Personal history of other malignant neoplasm of skin: Secondary | ICD-10-CM | POA: Diagnosis not present

## 2013-11-09 DIAGNOSIS — M1990 Unspecified osteoarthritis, unspecified site: Secondary | ICD-10-CM | POA: Insufficient documentation

## 2013-11-09 DIAGNOSIS — F3289 Other specified depressive episodes: Secondary | ICD-10-CM | POA: Insufficient documentation

## 2013-11-09 MED ORDER — OXYCODONE-ACETAMINOPHEN 5-325 MG PO TABS
1.0000 | ORAL_TABLET | Freq: Once | ORAL | Status: AC
Start: 2013-11-09 — End: 2013-11-09
  Administered 2013-11-09: 1 via ORAL
  Filled 2013-11-09: qty 1

## 2013-11-09 MED ORDER — OXYCODONE-ACETAMINOPHEN 5-325 MG PO TABS
1.0000 | ORAL_TABLET | Freq: Once | ORAL | Status: AC
  Administered 2013-11-09: 1 via ORAL
  Filled 2013-11-09: qty 1

## 2013-11-09 MED ORDER — OXYCODONE-ACETAMINOPHEN 5-325 MG PO TABS: 1.0000 | ORAL_TABLET | Freq: Four times a day (QID) | ORAL | Status: DC | PRN

## 2013-11-09 MED ORDER — CYCLOBENZAPRINE HCL 10 MG PO TABS: 10.0000 mg | ORAL_TABLET | Freq: Two times a day (BID) | ORAL | Status: DC | PRN

## 2013-11-09 NOTE — ED Provider Notes (Signed)
CSN: [id number]     Arrival date & time 11/09/13  1058 History   First MD Initiated Contact with Patient 11/09/13 1111     Chief Complaint  Patient presents with  . Hip Pain  . Fall   HPI Comments: Patient is an 80 y.o. Male who presents to the ED today with hip pain x 2 days.  Patient states that his hip pain started after a mechanical fall that he experienced on 11/07/13.  Patient was seen in the ED on the 26th and xrays of the left hip and the elbow showed no fractures at that time.  Patient reports that his pain has been increasing.  He states that his pain is intermittent, sharp, radiating down the thigh to above the knee, and a 9/10 in severity.  He denies groin pain, back pain, saddle anesthesias, or loss of bowel or bladder.  Patient does have bilateral knee replacements that were performed by Dr. Wynelle Link remotely.  He does not have any hardware in his hips.  He has experienced some relief from oxycodon 5/325 at home.    Patient is a 80 y.o. male presenting with hip pain and fall. The history is provided by the patient. No language interpreter was used.  Hip Pain Associated symptoms include arthralgias, joint swelling and myalgias. Pertinent negatives include no chills, fever or neck pain.  Fall Associated symptoms include arthralgias, joint swelling and myalgias. Pertinent negatives include no chills, fever or neck pain.    Past Medical History  Diagnosis Date  . Hypertension   . Hyperlipidemia   . Diabetes mellitus   . Hypothyroidism   . Macular degeneration   . OA (osteoarthritis)   . Anxiety   . Depression   . PVC's (premature ventricular contractions)   . Hx: UTI (urinary tract infection)   . Insomnia   . Stroke     mid 1990s   . Pneumonia   . Chronic kidney disease     hx of uti due to catheter hx of prostatitis, BPH -   . Cancer     basal and squamous skin carcinoma   . Cataract   . Glaucoma   . Blood transfusion without reported diagnosis   . Altered mental  status 05/15/2011  . Hypokalemia 05/15/2011  . Acute blood loss anemia 05/15/2011    post surgical  . Glaucoma 01/04/2013  . Hx of transient ischemic attack (TIA) 01/04/2013  . Macular degeneration   . HTN (hypertension) 03/30/2013  . Skin cancer   . Diabetes type 2, controlled   . Hyperlipidemia    Past Surgical History  Procedure Laterality Date  . Replacement total knee    . Thyroidectomy    . Appendectomy    . Tonsillectomy and adenoidectomy    . Inguinal hernia repair    . US echocardiography  11/10/2006    EF 55-60%  . US echocardiography  09/20/2002    EF 65-70%  . Other surgical history      microwave procedure for prostate - 09/12   . Total knee arthroplasty  05/11/2011    Procedure: TOTAL KNEE ARTHROPLASTY;  Surgeon: Gearlean Alf;  Location: [facility];  Service: Orthopedics;  Laterality: Right;  . Prostate ablation      microwave   Family History  Problem Relation Age of Onset  . Hypertension Mother   . Arthritis Mother   . Diabetes Mother   . Hypertension Father   . Stroke Father   . Kidney disease Father   .  Hypertension Brother   . Diabetes Brother    History  Substance Use Topics  . Smoking status: Never Smoker   . Smokeless tobacco: Never Used  . Alcohol Use: 4.2 oz/week    7 Shots of liquor per week     Comment: 2 drinks most days    Review of Systems  Constitutional: Negative for fever and chills.  Musculoskeletal: Positive for arthralgias, gait problem, joint swelling and myalgias. Negative for back pain, neck pain and neck stiffness.  Skin: Positive for color change.       Bruising over the left hip  Hematological: Bruises/bleeds easily.  All other systems reviewed and are negative.     Allergies  Lipitor and Zetia  Home Medications   Prior to Admission medications   Medication Sig Start Date End Date Taking? Authorizing Provider  ALPRAZolam (XANAX) 0.25 MG tablet Take 1 tablet (0.25 mg total) by mouth 2 (two) times daily. 05/02/13   Yes Darlin Coco, MD  amLODipine (NORVASC) 5 MG tablet Take 1 tablet (5 mg total) by mouth every morning. 09/06/13  Yes Darlin Coco, MD  aspirin 81 MG tablet Take 81 mg by mouth daily.   Yes Historical Provider, MD  Bevacizumab (AVASTIN IV) Place into the right eye as directed. Every 5 weeks   Yes Historical Provider, MD  clopidogrel (PLAVIX) 75 MG tablet Take 1 tablet (75 mg total) by mouth daily. 05/02/13  Yes Darlin Coco, MD  cyclobenzaprine (FLEXERIL) 10 MG tablet Take 1 tablet (10 mg total) by mouth 3 (three) times daily as needed for muscle spasms (DO NO TAKE WITH PERCOCET AT SAME TIME). 11/07/13  Yes Sharyon Cable, MD  digoxin (LANOXIN) 0.25 MG tablet Take 0.125 mg by mouth daily.  05/02/13  Yes Darlin Coco, MD  famotidine (PEPCID) 20 MG tablet Take 1 tablet (20 mg total) by mouth 2 (two) times daily. 09/06/13  Yes Darlin Coco, MD  latanoprost (XALATAN) 0.005 % ophthalmic solution Place 1 drop into both eyes at bedtime.    Yes Historical Provider, MD  levothyroxine (SYNTHROID, LEVOTHROID) 100 MCG tablet 1 tablet on Sunday, Tuesday, Thursday, and Saturday.  1 and 1/2 tablets on Monday, Wednesday, and Friday. 10/18/13  Yes Biagio Borg, MD  liothyronine (CYTOMEL) 25 MCG tablet Take 1 tablet (25 mcg total) by mouth every morning. 1 tablet 6 days a week. Pt does not take on sunday 05/02/13  Yes Darlin Coco, MD  metFORMIN (GLUCOPHAGE) 1000 MG tablet Take 1000 mg daily 05/02/13  Yes Darlin Coco, MD  metoprolol succinate (TOPROL-XL) 25 MG 24 hr tablet Take 1 tablet (25 mg total) by mouth every morning. 05/02/13  Yes Darlin Coco, MD  multivitamin-lutein [facility]) CAPS Take 1 capsule by mouth daily.   Yes Historical Provider, MD  oxyCODONE-acetaminophen (PERCOCET/ROXICET) 5-325 MG per tablet Take 1 tablet by mouth every 4 (four) hours as needed for severe pain. 11/07/13  Yes Sharyon Cable, MD  sildenafil (REVATIO) 20 MG tablet Take 2-5 tablets by mouth  once daily as needed. 10/18/13  Yes Biagio Borg, MD  VIGAMOX 0.5 % ophthalmic solution Place 1 drop into the right eye 4 (four) times daily - after meals and at bedtime. 10/30/13  Yes Historical Provider, MD  zolpidem (AMBIEN) 5 MG tablet Take 1 tablet (5 mg total) by mouth at bedtime as needed for sleep. 05/02/13  Yes Darlin Coco, MD   BP 126/58  Pulse 68  Temp(Src) 98.1 F (36.7 C) (Oral)  Resp 18  SpO2 97%  Physical Exam  Nursing note and vitals reviewed. Constitutional: He is oriented to person, place, and time. He appears well-developed and well-nourished. No distress.  HENT:  Head: Normocephalic and atraumatic.  Mouth/Throat: Oropharynx is clear and moist.  Eyes: Conjunctivae are normal. No scleral icterus.  Neck: Normal range of motion. Neck supple.  Cardiovascular: Normal rate, regular rhythm, normal heart sounds and intact distal pulses.  Exam reveals no gallop and no friction rub.   No murmur heard. Pulmonary/Chest: Effort normal and breath sounds normal. No respiratory distress. He has no wheezes. He has no rales. He exhibits no tenderness.  Musculoskeletal:  Patient ambulates with an antalgic gait to the left with a walker.  There is obvious noted ecchymosis over the lateral hip with some swelling.  No erythema is noted.  There is tenderness to palpation over the trochanteric bursa.  Palpable spasm of the left quad.  ROM is limited due to pain and anxiety.  Pain with hip extension and abduction.  5/5 strength with hip abduction, adduction, knee flexion, knee extension, and ankle dorsiflexion and plantar flexion.  Sensation to light touch of the left extremity is intact.  Left DP and PT pulses 2+.  Neurological: He is alert and oriented to person, place, and time.  Skin: Skin is warm and dry. He is not diaphoretic.  Large ecchymosis noted over the left greater trochanter.  Scattered abrasions throughout.  Psychiatric: He has a normal mood and affect. His behavior is normal.  Judgment and thought content normal.    ED Course  Procedures (including critical care time) Labs Review Labs Reviewed - No data to display  Imaging Review Mr Hip Left Wo Contrast  11/09/2013   CLINICAL DATA:  Severe left hip pain following a fall.  EXAM: MRI OF THE LEFT HIP WITHOUT CONTRAST  TECHNIQUE: Multiplanar, multisequence MR imaging was performed. No intravenous contrast was administered.  COMPARISON:  11/07/2013.  FINDINGS: Nondisplaced left obturator ring fractures are present. These involve the root of the left superior pubic ramus and left inferior pubic ramus. There is radiating edema in the left adductor compartment. Small left hip effusion is present which may be reactive for posttraumatic. Sacrum appears intact. Medial iliac bones appear intact. There are no right obturator ring fractures.  Significantly, both femoral necks appear intact. Subchondral geode is present in the right acetabular roof, incidentally noted. There is also a small cyst adjacent to the posterior superior quadrant of the right acetabular labrum, likely representing a paralabral cyst due to labral tear. The visceral pelvis appears within normal limits. Mild muscular edema is present on both sides of the obturator ring. Soft tissue contusion is present in the subcutaneous fat lateral to the left hip.  IMPRESSION: 1. Nondisplaced left obturator ring fractures with left adductor strain. 2. Negative for femoral neck fractures. 3. Moderate right hip osteoarthritis.   Electronically Signed   By: Dereck Ligas M.D.   On: 11/09/2013 16:04     EKG Interpretation None      MDM   Final diagnoses:  Pelvic fracture   Patient comes back in today for left hip pain.  He states that he has had increasing pain.  Initial xrays of the left hip performed on 5/26 were negative.  We were initially going to CT the left hip, but the patient was not comfortable with this plan.  I consulted Dr. Onnie Graham as the patient has a previous  history of being seen at [facility] and he suggested with history of plavix  use to perform MRI.  MRI showed non-displaced obturator ring fracture and abductor strain.  No hematoma was appreciated on MRI.  Dr. Onnie Graham was updated on the results and agreed with discharge, pain medication, muscle relaxant, and to weight bear as tolerated.  I have spoken with the patient about the plan.  He understands this plan. I have given him enough oxycodone 5/325 for 1 pill q6 hrs for four days, and flexaril TID for 4 days.  The patient is aware that if he runs out of medication before he can be seen by Dr. Wynelle Link then he should see his primary care provider for pain management.  Patient is also aware that he cannot drive or operate heavy machinery on these prescribed medications.    Kenard Gower, PA-C 11/09/13 1713

## 2013-11-09 NOTE — ED Notes (Signed)
Called MRI to follow up on pt transport, MRI states pt is next in line. Pt and family updated.

## 2013-11-09 NOTE — ED Notes (Signed)
Pt reports having a fall on Tuesday and was seen at that time, had xray done of left hip and elbow which were negative. Still having pain and difficulty walking and was sent here for possible MRI of hip.

## 2013-11-09 NOTE — ED Provider Notes (Signed)
Medical screening examination/treatment/procedure(s) were conducted as a shared visit with non-physician practitioner(s) and myself.  I personally evaluated the patient during the encounter.   EKG Interpretation None      Pt with ongoing left hip pain after fall.  MR shows obturator ring fracture.  Will f/u with his orthopedist  Malvin Johns, MD 11/09/13 [phone]

## 2013-11-09 NOTE — Discharge Instructions (Signed)
Stable Pelvic Fracture, Adult °You have one or more fractures (this means there is a break in the bones) of the pelvis. The pelvis is the ring of bones that make up your hipbones. These are the bones you sit on and the lower part of the spine. It is like a boney ring where your legs attach and which supports your upper body. You have an un-displaced fracture. This means the bones are in good position. The pelvic fracture you have is a simple (uncomplicated) fracture. °DIAGNOSIS  °X-rays usually diagnose these fractures. °TREATMENT  °The goals of treating pelvic fractures are to get the bones to heal in a good position. The patient should return to normal activities as soon as possible. Such fractures are often treated with normal bed rest and conservative measures.  °HOME CARE INSTRUCTIONS  °· You should be on bed rest for as long as directed by your caregiver. Change positions of your legs every 1-2 hours to maintain good blood flow. You may sit as long as is tolerable. Following this, you may do usual activities, but avoid strenuous activities for as long as directed by your caregiver. °· Only take over-the-counter or prescription medicines for pain, discomfort, or fever as directed by your caregiver. °· Bed-rest may also be used for discomfort. °· Resume your activities when you are able. Use a cain or crutch on the injured side to reduce pain while walking, as needed. °· If you develop increased pain or discomfort not relieved with medications, contact your caregiver. °· Warning: Do not drive a car or operate a motor vehicle until your caregiver specifically tells you it is safe to do so. °SEEK IMMEDIATE MEDICAL CARE IF:  °· You feel light-headed or faint, develop chest pain or shortness of breath. °· An unexplained oral temperature above 102° F (38.9° C) develops. °· You develop blood in the urine or in the stools. °· There is difficulty urinating, and/or having a bowel movement, or pain with these  efforts. °· There is a difficulty or increased pain with walking. °· There is swelling in one or both legs that is not normal. °Document Released: 08/10/2001 Document Revised: 02/01/2013 Document Reviewed: 01/13/2008 °[employer]® Patient Information ©2014 [employer]. ° °

## 2013-11-09 NOTE — Telephone Encounter (Signed)
Left message to call back  

## 2013-11-10 ENCOUNTER — Telehealth: Payer: Self-pay | Admitting: Family Medicine

## 2013-11-10 NOTE — Telephone Encounter (Signed)
Pt request phone call from Dr. Tamala Julian or his assistant concern about his back. Offer an appt with Dr. Tamala Julian but he would like to speak to someone first. Please call pt

## 2013-11-10 NOTE — Telephone Encounter (Signed)
Discussed with pt

## 2013-11-14 DIAGNOSIS — IMO0001 Reserved for inherently not codable concepts without codable children: Secondary | ICD-10-CM | POA: Diagnosis not present

## 2013-11-16 ENCOUNTER — Telehealth: Payer: Self-pay | Admitting: Cardiology

## 2013-11-16 DIAGNOSIS — F419 Anxiety disorder, unspecified: Secondary | ICD-10-CM

## 2013-11-16 DIAGNOSIS — G4700 Insomnia, unspecified: Secondary | ICD-10-CM

## 2013-11-16 NOTE — Telephone Encounter (Signed)
Pt calling to follow up on Ambien RX paperwork - states Rip Harbour knows about it and wants her to call him back tomorrow.

## 2013-11-16 NOTE — Telephone Encounter (Signed)
New problem   Pt called to office and wanted to speak to [facility] asap, i advised pt that Rip Harbour was out of office today and i couldn't send message to nurse that wasn;t here and pt got very upset with me. And I advise  Pt he could speak to my manager and he said ok, in the process of me getting Jari Sportsman on the phone the patient hung up phone. I tried to call pt back at both number and pt didn't pick up.

## 2013-11-17 MED ORDER — ALPRAZOLAM 0.25 MG PO TABS: 0.2500 mg | ORAL_TABLET | Freq: Two times a day (BID) | ORAL | Status: DC

## 2013-11-17 NOTE — Telephone Encounter (Signed)
Need to call [employer] [phone], requested another form.  Explained to patient when I tried to call patient previously number had in system did not have him having coverage.

## 2013-11-17 NOTE — Telephone Encounter (Signed)
Need to call [employer] [phone], requested another form.  Explained to patient when I tried to call patient previously number had in system did not have him having coverage.

## 2013-11-20 NOTE — Telephone Encounter (Signed)
Form completed and faxed, will wait for PA

## 2013-11-21 NOTE — Telephone Encounter (Signed)
[employer] approved zolpidem 5 mg tablet 90/90 good through 11/22/2014

## 2013-11-22 MED ORDER — ZOLPIDEM TARTRATE 5 MG PO TABS: 5.0000 mg | ORAL_TABLET | Freq: Every evening | ORAL | Status: DC | PRN

## 2013-11-22 NOTE — Telephone Encounter (Signed)
Advised patient

## 2013-11-29 DIAGNOSIS — H35059 Retinal neovascularization, unspecified, unspecified eye: Secondary | ICD-10-CM | POA: Diagnosis not present

## 2013-11-29 DIAGNOSIS — H353290 Exudative age-related macular degeneration, unspecified eye, stage unspecified: Secondary | ICD-10-CM | POA: Diagnosis not present

## 2013-12-12 DIAGNOSIS — IMO0001 Reserved for inherently not codable concepts without codable children: Secondary | ICD-10-CM | POA: Diagnosis not present

## 2014-01-01 ENCOUNTER — Other Ambulatory Visit (INDEPENDENT_AMBULATORY_CARE_PROVIDER_SITE_OTHER): Payer: Medicare Other

## 2014-01-01 DIAGNOSIS — Z79899 Other long term (current) drug therapy: Secondary | ICD-10-CM | POA: Diagnosis not present

## 2014-01-01 DIAGNOSIS — E039 Hypothyroidism, unspecified: Secondary | ICD-10-CM | POA: Diagnosis not present

## 2014-01-01 DIAGNOSIS — I119 Hypertensive heart disease without heart failure: Secondary | ICD-10-CM

## 2014-01-01 DIAGNOSIS — E119 Type 2 diabetes mellitus without complications: Secondary | ICD-10-CM | POA: Diagnosis not present

## 2014-01-01 LAB — BASIC METABOLIC PANEL
BUN: 15 mg/dL (ref 6–23)
CALCIUM: 9.2 mg/dL (ref 8.4–10.5)
CO2: 27 mEq/L (ref 19–32)
Chloride: 104 mEq/L (ref 96–112)
Creatinine, Ser: 1.1 mg/dL (ref 0.4–1.5)
GFR: 70.84 mL/min (ref >60.00)
Glucose, Bld: 128 mg/dL — ABNORMAL HIGH (ref 70–99)
Potassium: 4.3 mEq/L (ref 3.5–5.1)
SODIUM: 138 meq/L (ref 135–145)

## 2014-01-01 LAB — HEPATIC FUNCTION PANEL
ALBUMIN: 4.3 g/dL (ref 3.5–5.2)
ALT: 14 U/L (ref 0–53)
AST: 18 U/L (ref 0–37)
Alkaline Phosphatase: 87 U/L (ref 39–117)
Bilirubin, Direct: 0 mg/dL (ref 0.0–0.3)
TOTAL PROTEIN: 7.1 g/dL (ref 6.0–8.3)
Total Bilirubin: 0.5 mg/dL (ref 0.2–1.2)

## 2014-01-01 LAB — LIPID PANEL
Cholesterol: 164 mg/dL (ref 0–200)
HDL: 62.2 mg/dL (ref >39.00)
LDL Cholesterol: 89 mg/dL (ref 0–99)
NONHDL: 101.8
TRIGLYCERIDES: 65 mg/dL (ref 0.0–149.0)
Total CHOL/HDL Ratio: 3
VLDL: 13 mg/dL (ref 0.0–40.0)

## 2014-01-01 LAB — TSH

## 2014-01-01 LAB — T4, FREE: FREE T4: 0.79 ng/dL (ref 0.60–1.60)

## 2014-01-01 LAB — HEMOGLOBIN A1C: Hgb A1c MFr Bld: 6.3 % (ref 4.6–6.5)

## 2014-01-02 DIAGNOSIS — H353290 Exudative age-related macular degeneration, unspecified eye, stage unspecified: Secondary | ICD-10-CM | POA: Diagnosis not present

## 2014-01-02 DIAGNOSIS — H35059 Retinal neovascularization, unspecified, unspecified eye: Secondary | ICD-10-CM | POA: Diagnosis not present

## 2014-01-02 LAB — DIGOXIN LEVEL: DIGOXIN LVL: 0.7 ng/mL — AB (ref 0.8–2.0)

## 2014-01-02 NOTE — Progress Notes (Signed)
Quick Note:  Please make copy of labs for patient visit. ______ 

## 2014-01-03 DIAGNOSIS — M5137 Other intervertebral disc degeneration, lumbosacral region: Secondary | ICD-10-CM | POA: Diagnosis not present

## 2014-01-03 DIAGNOSIS — IMO0001 Reserved for inherently not codable concepts without codable children: Secondary | ICD-10-CM | POA: Diagnosis not present

## 2014-01-04 ENCOUNTER — Encounter: Payer: Self-pay | Admitting: Cardiology

## 2014-01-04 ENCOUNTER — Ambulatory Visit (INDEPENDENT_AMBULATORY_CARE_PROVIDER_SITE_OTHER): Payer: Medicare Other | Admitting: Cardiology

## 2014-01-04 VITALS — BP 148/82 | HR 62 | Ht 73.0 in | Wt 170.0 lb

## 2014-01-04 DIAGNOSIS — E034 Atrophy of thyroid (acquired): Secondary | ICD-10-CM

## 2014-01-04 DIAGNOSIS — E785 Hyperlipidemia, unspecified: Secondary | ICD-10-CM | POA: Diagnosis not present

## 2014-01-04 DIAGNOSIS — E1142 Type 2 diabetes mellitus with diabetic polyneuropathy: Secondary | ICD-10-CM

## 2014-01-04 DIAGNOSIS — E038 Other specified hypothyroidism: Secondary | ICD-10-CM

## 2014-01-04 DIAGNOSIS — E1165 Type 2 diabetes mellitus with hyperglycemia: Secondary | ICD-10-CM

## 2014-01-04 DIAGNOSIS — K219 Gastro-esophageal reflux disease without esophagitis: Secondary | ICD-10-CM | POA: Diagnosis not present

## 2014-01-04 DIAGNOSIS — E1149 Type 2 diabetes mellitus with other diabetic neurological complication: Secondary | ICD-10-CM

## 2014-01-04 DIAGNOSIS — I119 Hypertensive heart disease without heart failure: Secondary | ICD-10-CM

## 2014-01-04 DIAGNOSIS — IMO0001 Reserved for inherently not codable concepts without codable children: Secondary | ICD-10-CM

## 2014-01-04 DIAGNOSIS — E0789 Other specified disorders of thyroid: Secondary | ICD-10-CM

## 2014-01-04 MED ORDER — CLOPIDOGREL BISULFATE 75 MG PO TABS: 75.0000 mg | ORAL_TABLET | Freq: Every day | ORAL | Status: DC

## 2014-01-04 MED ORDER — METOPROLOL SUCCINATE ER 25 MG PO TB24: 25.0000 mg | ORAL_TABLET | ORAL | Status: DC

## 2014-01-04 MED ORDER — FAMOTIDINE 20 MG PO TABS: 20.0000 mg | ORAL_TABLET | Freq: Two times a day (BID) | ORAL | Status: DC

## 2014-01-04 MED ORDER — AMLODIPINE BESYLATE 5 MG PO TABS: 5.0000 mg | ORAL_TABLET | ORAL | Status: DC

## 2014-01-04 MED ORDER — LEVOTHYROXINE SODIUM 100 MCG PO TABS: ORAL_TABLET | ORAL | Status: DC

## 2014-01-04 MED ORDER — SILDENAFIL CITRATE 20 MG PO TABS: ORAL_TABLET | ORAL | Status: DC

## 2014-01-04 NOTE — Assessment & Plan Note (Signed)
The patient is clinically euthyroid on his therapy

## 2014-01-04 NOTE — Patient Instructions (Signed)
Your physician recommends that you continue on your current medications as directed. Please refer to the Current Medication list given to you today.  Your physician recommends that you schedule a follow-up appointment in: 4 months with fasting labs (lp/bmet/hfp)  

## 2014-01-04 NOTE — Assessment & Plan Note (Signed)
Blood pressure has been remaining stable on current therapy.  No dizziness or syncope. 

## 2014-01-04 NOTE — Assessment & Plan Note (Signed)
The patient has not been experiencing any hypoglycemic episodes. 

## 2014-01-04 NOTE — Progress Notes (Signed)
Stephen Wilkinson Date of Birth:  Apr 18, 1929 [place] [address] [place], [place]  [address] [phone]        Fax   [phone]   History of Present Illness: This pleasant 80 year old gentleman is seen for a scheduled followup office visit. He has a history of diabetes and high blood pressure. He does not have any history of ischemic heart disease. He has had a past history of palpitations. He has hypothyroidism, BPH and osteoarthritis. He has had both of his knees replaced. Since his last visit he has had no new cardiac symptoms.  Since last visit the patient and his wife have had a reconciliation of their marriage.  The patient feels under much less stress now.  Since we last saw the patient he fell on a city sidewalk and fractured his left pelvis.  He had medical therapy and went back to see his orthopedist 4 weeks later and was declared that he had healed greater than expected.  The patient also is being treated with muscle relaxers for sciatica.  He is getting physical therapy.  He is scheduled for cataract surgery on his right eye in 2 weeks.   Current Outpatient Prescriptions  Medication Sig Dispense Refill  . ALPRAZolam (XANAX) 0.25 MG tablet Take 1 tablet (0.25 mg total) by mouth 2 (two) times daily.  180 tablet  1  . amLODipine (NORVASC) 5 MG tablet Take 1 tablet (5 mg total) by mouth every morning.  90 tablet  3  . aspirin 81 MG tablet Take 81 mg by mouth daily.      . Bevacizumab (AVASTIN IV) Place into the right eye as directed. Every 5 weeks      . clopidogrel (PLAVIX) 75 MG tablet Take 1 tablet (75 mg total) by mouth daily.  90 tablet  3  . digoxin (LANOXIN) 0.25 MG tablet Take 0.125 mg by mouth daily.       . famotidine (PEPCID) 20 MG tablet Take 1 tablet (20 mg total) by mouth 2 (two) times daily.  180 tablet  3  . latanoprost (XALATAN) 0.005 % ophthalmic solution Place 1 drop into both eyes at bedtime.       Marland Kitchen levothyroxine (SYNTHROID, LEVOTHROID) 100  MCG tablet 1 tablet on Sunday, Tuesday, Thursday, and Saturday.  1 and 1/2 tablets on Monday, Wednesday, and Friday.  120 tablet  3  . liothyronine (CYTOMEL) 25 MCG tablet Take 1 tablet (25 mcg total) by mouth every morning. 1 tablet 6 days a week. Pt does not take on sunday  90 tablet  3  . metFORMIN (GLUCOPHAGE) 1000 MG tablet Take 1000 mg daily  90 tablet  3  . metoprolol succinate (TOPROL-XL) 25 MG 24 hr tablet Take 1 tablet (25 mg total) by mouth every morning.  90 tablet  3  . multivitamin-lutein (OCUVITE-LUTEIN) CAPS Take 1 capsule by mouth daily.      . sildenafil (REVATIO) 20 MG tablet Take 2-5 tablets by mouth once daily as needed.  50 tablet  5  . VIGAMOX 0.5 % ophthalmic solution Place 1 drop into the right eye 4 (four) times daily - after meals and at bedtime.      Marland Kitchen zolpidem (AMBIEN) 5 MG tablet Take 1 tablet (5 mg total) by mouth at bedtime as needed for sleep.  90 tablet  1  . [DISCONTINUED] warfarin (COUMADIN) 3 MG tablet Take 1 tablet (3 mg total) by mouth one time only at 6 PM.  35 tablet  0   No current facility-administered medications for this visit.    Allergies  Allergen Reactions  . Lipitor [Atorvastatin Calcium] Other (See Comments)    Myalgia  . Zetia [Ezetimibe] Other (See Comments)    Myalgia    Patient Active Problem List   Diagnosis Date Noted  . Hearing loss, bilateral 10/18/2013  . Wrist arthritis 10/18/2013  . Preventative health care 03/30/2013  . Impaired vision in both eyes 03/30/2013  . Erectile dysfunction 03/30/2013  . HTN (hypertension) 03/30/2013  . Skin cancer   . Hyperlipidemia   . Chronic cough 03/10/2013  . First degree heart block 01/04/2013  . DM type 2 with diabetic peripheral neuropathy 01/04/2013  . BPH (benign prostatic hyperplasia) 01/04/2013  . Hx of transient ischemic attack (TIA) 01/04/2013  . Glaucoma 01/04/2013  . Osteoarthritis of right knee 05/12/2011  . Hypothyroidism 12/25/2010  . Benign hypertensive heart disease  without heart failure 12/25/2010  . Dyslipidemia 12/25/2010  . Anxiety and depression 12/25/2010  . Macular degeneration of both eyes 12/25/2010    History  Smoking status  . Never Smoker   Smokeless tobacco  . Never Used    History  Alcohol Use  . 4.2 oz/week  . 7 Shots of liquor per week    Comment: 2 drinks most days    Family History  Problem Relation Age of Onset  . Hypertension Mother   . Arthritis Mother   . Diabetes Mother   . Hypertension Father   . Stroke Father   . Kidney disease Father   . Hypertension Brother   . Diabetes Brother     Review of Systems: Constitutional: no fever chills diaphoresis or fatigue or change in weight.  Head and neck: no hearing loss, no epistaxis, no photophobia or visual disturbance. Respiratory: No cough, shortness of breath or wheezing. Cardiovascular: No chest pain peripheral edema, palpitations. Gastrointestinal: No abdominal distention, no abdominal pain, no change in bowel habits hematochezia or melena. Genitourinary: No dysuria, no frequency, no urgency, no nocturia. Musculoskeletal:No arthralgias, no back pain, no gait disturbance or myalgias. Neurological: No dizziness, no headaches, no numbness, no seizures, no syncope, no weakness, no tremors. Hematologic: No lymphadenopathy, no easy bruising. Psychiatric: No confusion, no hallucinations, no sleep disturbance.    Physical Exam: Filed Vitals:   01/04/14 0855  BP: 148/82  Pulse: 62   the general appearance reveals a well-developed well-nourished gentleman in no distress.The head and neck exam reveals pupils equal and reactive.  Extraocular movements are full.  There is no scleral icterus.  The mouth and pharynx are normal.  The neck is supple.  The carotids reveal no bruits.  The jugular venous pressure is normal.  The  thyroid is not enlarged.  There is no lymphadenopathy.  The chest is clear to percussion and auscultation.  There are no rales or rhonchi.  Expansion  of the chest is symmetrical.  The precordium is quiet.  The first heart sound is normal.  The second heart sound is physiologically split.  There is no murmur gallop rub or click.  There is no abnormal lift or heave.  The abdomen is soft and nontender.  The bowel sounds are normal.  The liver and spleen are not enlarged.  There are no abdominal masses.  There are no abdominal bruits.  Extremities reveal good pedal pulses.  There is no phlebitis or edema.  There is no cyanosis or clubbing.  Strength is normal and symmetrical in all extremities.  There  is no lateralizing weakness.  There are no sensory deficits.  The skin is warm and dry.  There is no rash.     Assessment / Plan: 1. essential hypertension without heart failure 2. Hypothyroidism 3. diabetes mellitus with peripheral neuropathy 4. history of palpitations and anxiety 5. history of TIA, on Plavix 6. Blindness 7. BPH 8. hyperlipidemia  Disposition continue on current medication.  Recheck in 4 months for office visit lipid panel hepatic function panel and basal metabolic panel

## 2014-01-04 NOTE — Assessment & Plan Note (Signed)
The patient reports no recent symptoms of GERD and he remains on Pepcid generic

## 2014-01-15 DIAGNOSIS — M545 Low back pain, unspecified: Secondary | ICD-10-CM | POA: Diagnosis not present

## 2014-01-18 DIAGNOSIS — H2510 Age-related nuclear cataract, unspecified eye: Secondary | ICD-10-CM | POA: Diagnosis not present

## 2014-01-18 DIAGNOSIS — H25019 Cortical age-related cataract, unspecified eye: Secondary | ICD-10-CM | POA: Diagnosis not present

## 2014-01-18 DIAGNOSIS — H2589 Other age-related cataract: Secondary | ICD-10-CM | POA: Diagnosis not present

## 2014-01-24 DIAGNOSIS — D235 Other benign neoplasm of skin of trunk: Secondary | ICD-10-CM | POA: Diagnosis not present

## 2014-01-24 DIAGNOSIS — D239 Other benign neoplasm of skin, unspecified: Secondary | ICD-10-CM | POA: Diagnosis not present

## 2014-01-24 DIAGNOSIS — Z85828 Personal history of other malignant neoplasm of skin: Secondary | ICD-10-CM | POA: Diagnosis not present

## 2014-01-24 DIAGNOSIS — L819 Disorder of pigmentation, unspecified: Secondary | ICD-10-CM | POA: Diagnosis not present

## 2014-01-24 DIAGNOSIS — L219 Seborrheic dermatitis, unspecified: Secondary | ICD-10-CM | POA: Diagnosis not present

## 2014-01-24 DIAGNOSIS — L570 Actinic keratosis: Secondary | ICD-10-CM | POA: Diagnosis not present

## 2014-01-24 DIAGNOSIS — L910 Hypertrophic scar: Secondary | ICD-10-CM | POA: Diagnosis not present

## 2014-01-24 DIAGNOSIS — L821 Other seborrheic keratosis: Secondary | ICD-10-CM | POA: Diagnosis not present

## 2014-02-01 ENCOUNTER — Ambulatory Visit: Payer: Medicare Other | Admitting: Podiatrist

## 2014-02-02 DIAGNOSIS — M5137 Other intervertebral disc degeneration, lumbosacral region: Secondary | ICD-10-CM | POA: Diagnosis not present

## 2014-02-09 ENCOUNTER — Telehealth: Payer: Self-pay | Admitting: Cardiology

## 2014-02-09 NOTE — Telephone Encounter (Signed)
Yes, he can hold his Plavix for 5 days prior to injection

## 2014-02-09 NOTE — Telephone Encounter (Signed)
New message      Need clearance for lumbar ESI----can he hold  plavix 5 days prior to injection?  Fax clearance to [phone]

## 2014-02-09 NOTE — Telephone Encounter (Signed)
Stephen Wilkinson with [facility] calling wanting to know if Plavix can be held 5 days prior to lumbar injection that is scheduled for 9/16. Can fax clearance to [phone] Att: Stephen Wilkinson.  Advised Dr. Mare Ferrari not in office today but will forward message to him for clearance.

## 2014-02-09 NOTE — Telephone Encounter (Signed)
Notified Stephen Wilkinson that Dr. Mare Ferrari authorized him stopping his Plavix for 5 days prior to injection. Faxed the note to her at [phone].

## 2014-02-14 DIAGNOSIS — H35059 Retinal neovascularization, unspecified, unspecified eye: Secondary | ICD-10-CM | POA: Diagnosis not present

## 2014-02-14 DIAGNOSIS — H35359 Cystoid macular degeneration, unspecified eye: Secondary | ICD-10-CM | POA: Diagnosis not present

## 2014-02-14 DIAGNOSIS — H353290 Exudative age-related macular degeneration, unspecified eye, stage unspecified: Secondary | ICD-10-CM | POA: Diagnosis not present

## 2014-02-22 ENCOUNTER — Ambulatory Visit (INDEPENDENT_AMBULATORY_CARE_PROVIDER_SITE_OTHER): Payer: Medicare Other | Admitting: Podiatrist

## 2014-02-22 DIAGNOSIS — B351 Tinea unguium: Secondary | ICD-10-CM | POA: Diagnosis not present

## 2014-02-22 DIAGNOSIS — E1142 Type 2 diabetes mellitus with diabetic polyneuropathy: Secondary | ICD-10-CM

## 2014-02-22 DIAGNOSIS — E1149 Type 2 diabetes mellitus with other diabetic neurological complication: Secondary | ICD-10-CM

## 2014-02-22 DIAGNOSIS — E1140 Type 2 diabetes mellitus with diabetic neuropathy, unspecified: Secondary | ICD-10-CM

## 2014-02-22 DIAGNOSIS — M79609 Pain in unspecified limb: Secondary | ICD-10-CM | POA: Diagnosis not present

## 2014-02-22 DIAGNOSIS — M79676 Pain in unspecified toe(s): Principal | ICD-10-CM

## 2014-02-22 NOTE — Progress Notes (Signed)
HPI: Patient presents today for follow up of foot and nail care. He is also here to pick up his diabetic inserts for his existing new balance shoes he prefers to purchase from the shoe market. Denies any new complaints today. He does continue to have neuropathy and states his mother and grandmother ended up with amputations due to circulation and neuropathy.  Objective: Patients chart is reviewed. Neurovascular status unchanged. Patients nails are thickened, discolored, distrophic, friable and brittle with yellow-brown discoloration. Patient subjectively relates they are painful with shoes and with ambulation of bilateral feet. He also has intractable porokeratotic lesions submet 5 right and submet 1 right. Pedal pulses palpable and strong bilateral  Assessment: Symptomatic onychomycosis, diabetes with neuropathy, plantar flexed metatarsals, pororkeratotic lesions x 2  Plan: Discussed treatment options and alternatives. The symptomatic toenails were debrided through manual an mechanical means without complication. The lesions were debrided without complication.

## 2014-03-13 DIAGNOSIS — M545 Low back pain, unspecified: Secondary | ICD-10-CM | POA: Diagnosis not present

## 2014-03-28 DIAGNOSIS — H3532 Exudative age-related macular degeneration: Secondary | ICD-10-CM | POA: Diagnosis not present

## 2014-03-28 DIAGNOSIS — H35051 Retinal neovascularization, unspecified, right eye: Secondary | ICD-10-CM | POA: Diagnosis not present

## 2014-03-29 DIAGNOSIS — M4800 Spinal stenosis, site unspecified: Secondary | ICD-10-CM | POA: Diagnosis not present

## 2014-03-29 DIAGNOSIS — M5136 Other intervertebral disc degeneration, lumbar region: Secondary | ICD-10-CM | POA: Diagnosis not present

## 2014-03-29 DIAGNOSIS — M4319 Spondylolisthesis, multiple sites in spine: Secondary | ICD-10-CM | POA: Diagnosis not present

## 2014-04-09 DIAGNOSIS — Z23 Encounter for immunization: Secondary | ICD-10-CM | POA: Diagnosis not present

## 2014-05-01 ENCOUNTER — Ambulatory Visit: Payer: Medicare Other | Admitting: Internal Medicine

## 2014-05-03 ENCOUNTER — Other Ambulatory Visit: Payer: Medicare Other

## 2014-05-04 ENCOUNTER — Other Ambulatory Visit (INDEPENDENT_AMBULATORY_CARE_PROVIDER_SITE_OTHER): Payer: Medicare Other | Admitting: *Deleted

## 2014-05-04 DIAGNOSIS — I119 Hypertensive heart disease without heart failure: Secondary | ICD-10-CM

## 2014-05-04 DIAGNOSIS — E785 Hyperlipidemia, unspecified: Secondary | ICD-10-CM

## 2014-05-04 LAB — BASIC METABOLIC PANEL
BUN: 13 mg/dL (ref 6–23)
CO2: 26 meq/L (ref 19–32)
CREATININE: 1.1 mg/dL (ref 0.4–1.5)
Calcium: 9.2 mg/dL (ref 8.4–10.5)
Chloride: 105 mEq/L (ref 96–112)
GFR: 66.42 mL/min (ref >60.00)
GLUCOSE: 124 mg/dL — AB (ref 70–99)
Potassium: 4.1 mEq/L (ref 3.5–5.1)
Sodium: 139 mEq/L (ref 135–145)

## 2014-05-04 LAB — LIPID PANEL
CHOL/HDL RATIO: 3
Cholesterol: 162 mg/dL (ref 0–200)
HDL: 59.5 mg/dL (ref >39.00)
LDL Cholesterol: 91 mg/dL (ref 0–99)
NONHDL: 102.5
TRIGLYCERIDES: 56 mg/dL (ref 0.0–149.0)
VLDL: 11.2 mg/dL (ref 0.0–40.0)

## 2014-05-04 LAB — HEPATIC FUNCTION PANEL
ALK PHOS: 74 U/L (ref 39–117)
ALT: 19 U/L (ref 0–53)
AST: 23 U/L (ref 0–37)
Albumin: 4.2 g/dL (ref 3.5–5.2)
BILIRUBIN DIRECT: 0 mg/dL (ref 0.0–0.3)
BILIRUBIN TOTAL: 0.7 mg/dL (ref 0.2–1.2)
Total Protein: 7 g/dL (ref 6.0–8.3)

## 2014-05-04 NOTE — Progress Notes (Signed)
Quick Note:  Please make copy of labs for patient visit. ______ 

## 2014-05-07 ENCOUNTER — Ambulatory Visit (INDEPENDENT_AMBULATORY_CARE_PROVIDER_SITE_OTHER): Payer: Medicare Other | Admitting: Cardiology

## 2014-05-07 ENCOUNTER — Encounter: Payer: Self-pay | Admitting: Cardiology

## 2014-05-07 VITALS — BP 138/72 | HR 53 | Ht 73.0 in | Wt 174.0 lb

## 2014-05-07 DIAGNOSIS — F419 Anxiety disorder, unspecified: Secondary | ICD-10-CM | POA: Diagnosis not present

## 2014-05-07 DIAGNOSIS — E1142 Type 2 diabetes mellitus with diabetic polyneuropathy: Secondary | ICD-10-CM | POA: Diagnosis not present

## 2014-05-07 DIAGNOSIS — G4700 Insomnia, unspecified: Secondary | ICD-10-CM | POA: Diagnosis not present

## 2014-05-07 DIAGNOSIS — E038 Other specified hypothyroidism: Secondary | ICD-10-CM | POA: Diagnosis not present

## 2014-05-07 DIAGNOSIS — G629 Polyneuropathy, unspecified: Secondary | ICD-10-CM | POA: Diagnosis not present

## 2014-05-07 DIAGNOSIS — E785 Hyperlipidemia, unspecified: Secondary | ICD-10-CM

## 2014-05-07 DIAGNOSIS — I119 Hypertensive heart disease without heart failure: Secondary | ICD-10-CM | POA: Diagnosis not present

## 2014-05-07 MED ORDER — ALPRAZOLAM 0.25 MG PO TABS: 0.2500 mg | ORAL_TABLET | Freq: Two times a day (BID) | ORAL | Status: DC

## 2014-05-07 MED ORDER — ZOLPIDEM TARTRATE 5 MG PO TABS: 5.0000 mg | ORAL_TABLET | Freq: Every evening | ORAL | Status: DC | PRN

## 2014-05-07 MED ORDER — LEVOTHYROXINE SODIUM 100 MCG PO TABS: ORAL_TABLET | ORAL | Status: DC

## 2014-05-07 MED ORDER — AMLODIPINE BESYLATE 5 MG PO TABS: 5.0000 mg | ORAL_TABLET | ORAL | Status: DC

## 2014-05-07 MED ORDER — CLOPIDOGREL BISULFATE 75 MG PO TABS: 75.0000 mg | ORAL_TABLET | Freq: Every day | ORAL | Status: DC

## 2014-05-07 MED ORDER — LIOTHYRONINE SODIUM 25 MCG PO TABS: 25.0000 ug | ORAL_TABLET | ORAL | Status: DC

## 2014-05-07 MED ORDER — METOPROLOL SUCCINATE ER 25 MG PO TB24: 25.0000 mg | ORAL_TABLET | ORAL | Status: DC

## 2014-05-07 NOTE — Assessment & Plan Note (Signed)
The patient has not been having any headaches or dizzy spells.  Blood pressure has been remaining stable.

## 2014-05-07 NOTE — Assessment & Plan Note (Signed)
The patient is clinically euthyroid on current thyroid replacement.  We will be checking blood work at his next visit.

## 2014-05-07 NOTE — Assessment & Plan Note (Signed)
His lipids have been reasonably stable on current medication.  He has not been able to walk as much because of his pelvic fracture which occurred in May 2015.  He has had some residual sciatica pain post pelvic fracture.  He is followed by Dr. Wynelle Link and Dr. Nelva Bush

## 2014-05-07 NOTE — Patient Instructions (Signed)
Your physician recommends that you continue on your current medications as directed. Please refer to the Current Medication list given to you today.  Your physician recommends that you schedule a follow-up appointment in:4 months with fasting labs (lp/bmet/hfp/a1c/tsh/ft4)

## 2014-05-07 NOTE — Progress Notes (Signed)
Stephen Wilkinson Date of Birth:  August 19, 1928 [facility] [address] [place], [place]  [address] [phone]        Fax   [phone]   History of Present Illness: This pleasant 80 year old gentleman is seen for a scheduled followup office visit. He has a history of diabetes and high blood pressure. He does not have any history of ischemic heart disease. He has had a past history of palpitations. He has hypothyroidism, BPH and osteoarthritis. He has had both of his knees replaced. Since his last visit he has had no new cardiac symptoms. He and his wife have been reconciled in her marriage and this has taken a lot of stress off of him.  Current Outpatient Prescriptions  Medication Sig Dispense Refill  . ALPRAZolam (XANAX) 0.25 MG tablet Take 1 tablet (0.25 mg total) by mouth 2 (two) times daily. 180 tablet 1  . amLODipine (NORVASC) 5 MG tablet Take 1 tablet (5 mg total) by mouth every morning. 90 tablet 3  . aspirin 81 MG tablet Take 81 mg by mouth daily.    . Bevacizumab (AVASTIN IV) Place into the right eye as directed. Every 5 weeks    . clopidogrel (PLAVIX) 75 MG tablet Take 1 tablet (75 mg total) by mouth daily. 90 tablet 3  . digoxin (LANOXIN) 0.25 MG tablet Take 0.125 mg by mouth daily.     . famotidine (PEPCID) 20 MG tablet Take 1 tablet (20 mg total) by mouth 2 (two) times daily. 180 tablet 3  . FLUZONE HIGH-DOSE 0.5 ML SUSY   0  . latanoprost (XALATAN) 0.005 % ophthalmic solution Place 1 drop into both eyes at bedtime.     Marland Kitchen levothyroxine (SYNTHROID, LEVOTHROID) 100 MCG tablet 1 tablet on Sunday, Tuesday, Thursday, and Saturday.  1 and 1/2 tablets on Monday, Wednesday, and Friday. 120 tablet 3  . liothyronine (CYTOMEL) 25 MCG tablet Take 1 tablet (25 mcg total) by mouth every morning. 1 tablet 6 days a week. Pt does not take on sunday 90 tablet 3  . metFORMIN (GLUCOPHAGE) 1000 MG tablet Take 1000 mg daily 90 tablet 3  . metoprolol succinate (TOPROL-XL) 25 MG 24  hr tablet Take 1 tablet (25 mg total) by mouth every morning. 90 tablet 3  . multivitamin-lutein (OCUVITE-LUTEIN) CAPS Take 1 capsule by mouth daily.    . sildenafil (REVATIO) 20 MG tablet Take 2-5 tablets by mouth once daily as needed. 50 tablet 5  . zolpidem (AMBIEN) 5 MG tablet Take 1 tablet (5 mg total) by mouth at bedtime as needed for sleep. 90 tablet 1  . [DISCONTINUED] warfarin (COUMADIN) 3 MG tablet Take 1 tablet (3 mg total) by mouth one time only at 6 PM. 35 tablet 0   No current facility-administered medications for this visit.    Allergies  Allergen Reactions  . Lipitor [Atorvastatin Calcium] Other (See Comments)    Myalgia  . Zetia [Ezetimibe] Other (See Comments)    Myalgia    Patient Active Problem List   Diagnosis Date Noted  . GERD (gastroesophageal reflux disease) 01/04/2014  . Hearing loss, bilateral 10/18/2013  . Wrist arthritis 10/18/2013  . Preventative health care 03/30/2013  . Impaired vision in both eyes 03/30/2013  . Erectile dysfunction 03/30/2013  . HTN (hypertension) 03/30/2013  . Skin cancer   . Hyperlipidemia   . Chronic cough 03/10/2013  . First degree heart block 01/04/2013  . DM type 2 with diabetic peripheral neuropathy 01/04/2013  .  BPH (benign prostatic hyperplasia) 01/04/2013  . Hx of transient ischemic attack (TIA) 01/04/2013  . Glaucoma 01/04/2013  . Osteoarthritis of right knee 05/12/2011  . Hypothyroidism 12/25/2010  . Benign hypertensive heart disease without heart failure 12/25/2010  . Dyslipidemia 12/25/2010  . Anxiety and depression 12/25/2010  . Macular degeneration of both eyes 12/25/2010    History  Smoking status  . Never Smoker   Smokeless tobacco  . Never Used    History  Alcohol Use  . 4.2 oz/week  . 7 Shots of liquor per week    Comment: 2 drinks most days    Family History  Problem Relation Age of Onset  . Hypertension Mother   . Arthritis Mother   . Diabetes Mother   . Hypertension Father   . Stroke  Father   . Kidney disease Father   . Hypertension Brother   . Diabetes Brother     Review of Systems: Constitutional: no fever chills diaphoresis or fatigue or change in weight.  Head and neck: no hearing loss, no epistaxis, no photophobia or visual disturbance. Respiratory: No cough, shortness of breath or wheezing. Cardiovascular: No chest pain peripheral edema, palpitations. Gastrointestinal: No abdominal distention, no abdominal pain, no change in bowel habits hematochezia or melena. Genitourinary: No dysuria, no frequency, no urgency, no nocturia. Musculoskeletal:No arthralgias, no back pain, no gait disturbance or myalgias. Neurological: No dizziness, no headaches, no numbness, no seizures, no syncope, no weakness, no tremors. Hematologic: No lymphadenopathy, no easy bruising. Psychiatric: No confusion, no hallucinations, no sleep disturbance.   Wt Readings from Last 3 Encounters:  05/07/14 174 lb (78.926 kg)  01/04/14 170 lb (77.111 kg)  10/31/13 175 lb 6.4 oz (79.561 kg)    Physical Exam: Filed Vitals:   05/07/14 1401  BP: 138/72  Pulse: 53   the general appearance reveals a well-developed well-nourished gentleman in no distress. The patient appears to be in no distress.  Head and neck exam reveals that the pupils are equal and reactive.  The extraocular movements are full.  There is no scleral icterus.  Mouth and pharynx are benign.  No lymphadenopathy.  No carotid bruits.  The jugular venous pressure is normal.  Thyroid is not enlarged or tender.  Chest is clear to percussion and auscultation.  No rales or rhonchi.  Expansion of the chest is symmetrical.  Heart reveals no abnormal lift or heave.  First and second heart sounds are normal.  There is no murmur gallop rub or click.  The abdomen is soft and nontender.  Bowel sounds are normoactive.  There is no hepatosplenomegaly or mass.  There are no abdominal bruits.  Extremities reveal no phlebitis or edema.  Pedal  pulses are good.  There is no cyanosis or clubbing.  Neurologic exam is normal strength and no lateralizing weakness.  No sensory deficits.  Integument reveals no rash  EKG shows sinus bradycardia and no ischemic changes  Assessment / Plan: 1.  Essential hypertension without heart failure 2.  Diabetes mellitus type 2 with diabetic peripheral neuropathy. 3.  Visual impairment secondary to macular degeneration. 4.  Hypothyroidism 5.  BPH 6.  Osteoarthritis 7.  Past history of TIA  Disposition: Continue same medication.  Recheck in 4 months for office visit lipid panel hepatic function panel basal metabolic panel hemoglobin A1c TSH and free T4

## 2014-05-15 DIAGNOSIS — H3532 Exudative age-related macular degeneration: Secondary | ICD-10-CM | POA: Diagnosis not present

## 2014-05-15 DIAGNOSIS — H35051 Retinal neovascularization, unspecified, right eye: Secondary | ICD-10-CM | POA: Diagnosis not present

## 2014-05-18 ENCOUNTER — Ambulatory Visit (INDEPENDENT_AMBULATORY_CARE_PROVIDER_SITE_OTHER): Payer: Medicare Other | Admitting: Internal Medicine

## 2014-05-18 ENCOUNTER — Encounter: Payer: Self-pay | Admitting: Internal Medicine

## 2014-05-18 VITALS — BP 120/72 | HR 91 | Temp 98.2°F | Ht 73.0 in | Wt 177.2 lb

## 2014-05-18 DIAGNOSIS — F329 Major depressive disorder, single episode, unspecified: Secondary | ICD-10-CM

## 2014-05-18 DIAGNOSIS — E1142 Type 2 diabetes mellitus with diabetic polyneuropathy: Secondary | ICD-10-CM

## 2014-05-18 DIAGNOSIS — F418 Other specified anxiety disorders: Secondary | ICD-10-CM | POA: Diagnosis not present

## 2014-05-18 DIAGNOSIS — I10 Essential (primary) hypertension: Secondary | ICD-10-CM

## 2014-05-18 DIAGNOSIS — F419 Anxiety disorder, unspecified: Secondary | ICD-10-CM

## 2014-05-18 DIAGNOSIS — G629 Polyneuropathy, unspecified: Secondary | ICD-10-CM | POA: Diagnosis not present

## 2014-05-18 DIAGNOSIS — F32A Depression, unspecified: Secondary | ICD-10-CM

## 2014-05-18 DIAGNOSIS — E785 Hyperlipidemia, unspecified: Secondary | ICD-10-CM

## 2014-05-18 MED ORDER — METFORMIN HCL 1000 MG PO TABS: ORAL_TABLET | ORAL | Status: DC

## 2014-05-18 MED ORDER — CITALOPRAM HYDROBROMIDE 10 MG PO TABS: 10.0000 mg | ORAL_TABLET | Freq: Every day | ORAL | Status: DC

## 2014-05-18 MED ORDER — GLUCOSE BLOOD VI STRP: ORAL_STRIP | Status: DC

## 2014-05-18 MED ORDER — SILDENAFIL CITRATE 20 MG PO TABS: ORAL_TABLET | ORAL | Status: DC

## 2014-05-18 NOTE — Patient Instructions (Signed)
Please take all new medication as prescribed - the citalopram 10 mg per day  Please call in 4 weeks if you feel you would want a higher dose such as the 20 mg  Please continue all other medications as before, and refills have been done if requested  Please have the pharmacy call with any other refills you may need.  Please continue your efforts at being more active, low cholesterol diabetic diet, and weight control.  Please keep your appointments with your specialists as you may have planned  We can hold off on further lab work today  Please return in 6 months, or sooner if needed
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
## 2014-05-18 NOTE — Progress Notes (Signed)
Pre visit review using our clinic review tool, if applicable. No additional management support is needed unless otherwise documented below in the visit note. 

## 2014-05-19 NOTE — Assessment & Plan Note (Signed)
stable overall by history and exam, recent data reviewed with pt, and pt to continue medical treatment as before,  to f/u any worsening symptoms or concerns Lab Results  Component Value Date   LDLCALC 91 05/04/2014

## 2014-05-19 NOTE — Assessment & Plan Note (Signed)
stable overall by history and exam, recent data reviewed with pt, and pt to continue medical treatment as before,  to f/u any worsening symptoms or concerns Lab Results  Component Value Date   HGBA1C 6.3 01/01/2014   For f/u labs

## 2014-05-19 NOTE — Assessment & Plan Note (Signed)
stable overall by history and exam, recent data reviewed with pt, and pt to continue medical treatment as before,  to f/u any worsening symptoms or concerns BP Readings from Last 3 Encounters:  05/18/14 120/72  05/07/14 138/72  01/04/14 148/82

## 2014-05-19 NOTE — Assessment & Plan Note (Signed)
For citalopram 10 qd, consider incr to 20 mg at 4 wks if not improved, cont xnax prn only,  to f/u any worsening symptoms or concerns

## 2014-05-24 ENCOUNTER — Ambulatory Visit (INDEPENDENT_AMBULATORY_CARE_PROVIDER_SITE_OTHER): Payer: Medicare Other | Admitting: Podiatrist

## 2014-05-24 DIAGNOSIS — B351 Tinea unguium: Secondary | ICD-10-CM | POA: Diagnosis not present

## 2014-05-24 DIAGNOSIS — M79676 Pain in unspecified toe(s): Secondary | ICD-10-CM | POA: Diagnosis not present

## 2014-05-24 NOTE — Progress Notes (Signed)
HPI: Patient presents today for follow up of foot and nail care. Denies any new complaints today. He does continue to have neuropathy and states his mother and grandmother ended up with amputations due to circulation and neuropathy.  Objective: Patients chart is reviewed. Neurovascular status unchanged. Patients nails are thickened, discolored, distrophic, friable and brittle with yellow-brown discoloration. Patient subjectively relates they are painful with shoes and with ambulation of bilateral feet. He also has intractable porokeratotic lesions submet 5 right and submet 1 right. Pedal pulses palpable and strong bilateral  Assessment: Symptomatic onychomycosis, diabetes with neuropathy, plantar flexed metatarsals, pororkeratotic lesions x 2  Plan: Discussed treatment options and alternatives. The symptomatic toenails were debrided through manual an mechanical means without complication. The lesions were debrided without complication.   

## 2014-05-30 DIAGNOSIS — L570 Actinic keratosis: Secondary | ICD-10-CM | POA: Diagnosis not present

## 2014-05-30 DIAGNOSIS — L98499 Non-pressure chronic ulcer of skin of other sites with unspecified severity: Secondary | ICD-10-CM | POA: Diagnosis not present

## 2014-05-30 DIAGNOSIS — Z85828 Personal history of other malignant neoplasm of skin: Secondary | ICD-10-CM | POA: Diagnosis not present

## 2014-05-30 DIAGNOSIS — D485 Neoplasm of uncertain behavior of skin: Secondary | ICD-10-CM | POA: Diagnosis not present

## 2014-05-30 DIAGNOSIS — D1801 Hemangioma of skin and subcutaneous tissue: Secondary | ICD-10-CM | POA: Diagnosis not present

## 2014-05-30 DIAGNOSIS — D225 Melanocytic nevi of trunk: Secondary | ICD-10-CM | POA: Diagnosis not present

## 2014-05-30 DIAGNOSIS — L821 Other seborrheic keratosis: Secondary | ICD-10-CM | POA: Diagnosis not present

## 2014-06-08 ENCOUNTER — Encounter (HOSPITAL_COMMUNITY): Payer: Self-pay | Admitting: Emergency Medicine

## 2014-06-08 ENCOUNTER — Emergency Department (HOSPITAL_COMMUNITY)
Admission: EM | Admit: 2014-06-08 | Discharge: 2014-06-08 | Disposition: A | Discharge status: Home / Self Care | Payer: Medicare Other | Attending: Emergency Medicine | Admitting: Emergency Medicine

## 2014-06-08 ENCOUNTER — Emergency Department (HOSPITAL_COMMUNITY): Payer: Medicare Other

## 2014-06-08 DIAGNOSIS — Y9389 Activity, other specified: Secondary | ICD-10-CM | POA: Insufficient documentation

## 2014-06-08 DIAGNOSIS — M1990 Unspecified osteoarthritis, unspecified site: Secondary | ICD-10-CM | POA: Diagnosis not present

## 2014-06-08 DIAGNOSIS — N189 Chronic kidney disease, unspecified: Secondary | ICD-10-CM | POA: Diagnosis not present

## 2014-06-08 DIAGNOSIS — E039 Hypothyroidism, unspecified: Secondary | ICD-10-CM | POA: Diagnosis not present

## 2014-06-08 DIAGNOSIS — H3530 Unspecified macular degeneration: Secondary | ICD-10-CM | POA: Diagnosis not present

## 2014-06-08 DIAGNOSIS — F329 Major depressive disorder, single episode, unspecified: Secondary | ICD-10-CM | POA: Diagnosis not present

## 2014-06-08 DIAGNOSIS — G4700 Insomnia, unspecified: Secondary | ICD-10-CM | POA: Insufficient documentation

## 2014-06-08 DIAGNOSIS — F419 Anxiety disorder, unspecified: Secondary | ICD-10-CM | POA: Insufficient documentation

## 2014-06-08 DIAGNOSIS — S098XXA Other specified injuries of head, initial encounter: Secondary | ICD-10-CM | POA: Diagnosis not present

## 2014-06-08 DIAGNOSIS — Z85828 Personal history of other malignant neoplasm of skin: Secondary | ICD-10-CM | POA: Diagnosis not present

## 2014-06-08 DIAGNOSIS — H409 Unspecified glaucoma: Secondary | ICD-10-CM | POA: Insufficient documentation

## 2014-06-08 DIAGNOSIS — Z87440 Personal history of urinary (tract) infections: Secondary | ICD-10-CM | POA: Diagnosis not present

## 2014-06-08 DIAGNOSIS — S0001XA Abrasion of scalp, initial encounter: Secondary | ICD-10-CM | POA: Diagnosis not present

## 2014-06-08 DIAGNOSIS — Y9289 Other specified places as the place of occurrence of the external cause: Secondary | ICD-10-CM | POA: Insufficient documentation

## 2014-06-08 DIAGNOSIS — Z862 Personal history of diseases of the blood and blood-forming organs and certain disorders involving the immune mechanism: Secondary | ICD-10-CM | POA: Insufficient documentation

## 2014-06-08 DIAGNOSIS — Z8701 Personal history of pneumonia (recurrent): Secondary | ICD-10-CM | POA: Insufficient documentation

## 2014-06-08 DIAGNOSIS — Z8673 Personal history of transient ischemic attack (TIA), and cerebral infarction without residual deficits: Secondary | ICD-10-CM | POA: Diagnosis not present

## 2014-06-08 DIAGNOSIS — S0990XA Unspecified injury of head, initial encounter: Secondary | ICD-10-CM | POA: Diagnosis not present

## 2014-06-08 DIAGNOSIS — W228XXA Striking against or struck by other objects, initial encounter: Secondary | ICD-10-CM | POA: Diagnosis not present

## 2014-06-08 DIAGNOSIS — E119 Type 2 diabetes mellitus without complications: Secondary | ICD-10-CM | POA: Diagnosis not present

## 2014-06-08 DIAGNOSIS — Y998 Other external cause status: Secondary | ICD-10-CM | POA: Insufficient documentation

## 2014-06-08 DIAGNOSIS — I129 Hypertensive chronic kidney disease with stage 1 through stage 4 chronic kidney disease, or unspecified chronic kidney disease: Secondary | ICD-10-CM | POA: Diagnosis not present

## 2014-06-08 DIAGNOSIS — T148XXA Other injury of unspecified body region, initial encounter: Secondary | ICD-10-CM

## 2014-06-08 DIAGNOSIS — Z7982 Long term (current) use of aspirin: Secondary | ICD-10-CM | POA: Insufficient documentation

## 2014-06-08 DIAGNOSIS — Z79899 Other long term (current) drug therapy: Secondary | ICD-10-CM | POA: Insufficient documentation

## 2014-06-08 DIAGNOSIS — S0181XA Laceration without foreign body of other part of head, initial encounter: Secondary | ICD-10-CM | POA: Diagnosis not present

## 2014-06-08 NOTE — Discharge Instructions (Signed)
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
## 2014-06-08 NOTE — ED Notes (Signed)
Spoke with Jarrett Soho, [place] regarding patient. Head CT advised at this time given patient age and use of plavix.

## 2014-06-12 ENCOUNTER — Encounter: Payer: Self-pay | Admitting: Internal Medicine

## 2014-06-12 ENCOUNTER — Ambulatory Visit (INDEPENDENT_AMBULATORY_CARE_PROVIDER_SITE_OTHER): Payer: Medicare Other | Admitting: Internal Medicine

## 2014-06-12 ENCOUNTER — Ambulatory Visit: Payer: Medicare Other | Admitting: Internal Medicine

## 2014-06-12 VITALS — BP 140/82 | HR 64 | Temp 98.4°F | Resp 14 | Ht 73.0 in | Wt 178.5 lb

## 2014-06-12 DIAGNOSIS — S0191XD Laceration without foreign body of unspecified part of head, subsequent encounter: Secondary | ICD-10-CM

## 2014-06-12 NOTE — Progress Notes (Signed)
Pre visit review using our clinic review tool, if applicable. No additional management support is needed unless otherwise documented below in the visit note. 

## 2014-06-12 NOTE — Patient Instructions (Addendum)
   Please keep wound as dry as possible. At this time simply keep  the wound covered with a Telfa, nonstick dressing.  Report the warning signs of fever, pus production, bleeding or increasing pain.
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
## 2014-06-18 NOTE — ED Provider Notes (Signed)
CSN: [id number]     Arrival date & time 06/08/14  1920 History   First MD Initiated Contact with Patient 06/08/14 2149     Chief Complaint  Patient presents with  . Head Injury     (Consider location/radiation/quality/duration/timing/severity/associated sxs/prior Treatment) Patient is a 81 y.o. male presenting with head injury.  Head Injury Location:  L parietal Time since incident:  3 hours Mechanism of injury: direct blow   Pain details:    Quality:  Aching   Severity:  Moderate   Timing:  Constant   Progression:  Partially resolved Chronicity:  New Relieved by:  Nothing Worsened by:  Pressure Associated symptoms: no blurred vision, no disorientation, no double vision, no loss of consciousness, no memory loss, no nausea and no vomiting     Past Medical History  Diagnosis Date  . Hypertension   . Hyperlipidemia   . Diabetes mellitus   . Hypothyroidism   . Macular degeneration   . OA (osteoarthritis)   . Anxiety   . Depression   . PVC's (premature ventricular contractions)   . Hx: UTI (urinary tract infection)   . Insomnia   . Stroke     mid 1990s   . Pneumonia   . Chronic kidney disease     hx of uti due to catheter hx of prostatitis, BPH -   . Cancer     basal and squamous skin carcinoma   . Cataract   . Glaucoma   . Blood transfusion without reported diagnosis   . Altered mental status 05/15/2011  . Hypokalemia 05/15/2011  . Acute blood loss anemia 05/15/2011    post surgical  . Glaucoma 01/04/2013  . Hx of transient ischemic attack (TIA) 01/04/2013  . Macular degeneration   . HTN (hypertension) 03/30/2013  . Skin cancer   . Diabetes type 2, controlled   . Hyperlipidemia    Past Surgical History  Procedure Laterality Date  . Replacement total knee    . Thyroidectomy    . Appendectomy    . Tonsillectomy and adenoidectomy    . Inguinal hernia repair    . US echocardiography  11/10/2006    EF 55-60%  . US echocardiography  09/20/2002    EF 65-70%  .  Other surgical history      microwave procedure for prostate - 09/12   . Total knee arthroplasty  05/11/2011    Procedure: TOTAL KNEE ARTHROPLASTY;  Surgeon: Gearlean Alf;  Location: [facility];  Service: Orthopedics;  Laterality: Right;  . Prostate ablation      microwave   Family History  Problem Relation Age of Onset  . Hypertension Mother   . Arthritis Mother   . Diabetes Mother   . Hypertension Father   . Stroke Father   . Kidney disease Father   . Hypertension Brother   . Diabetes Brother    History  Substance Use Topics  . Smoking status: Never Smoker   . Smokeless tobacco: Never Used  . Alcohol Use: 4.2 oz/week    7 Shots of liquor per week     Comment: 2 drinks most days    Review of Systems  Eyes: Negative for blurred vision and double vision.  Gastrointestinal: Negative for nausea and vomiting.  Neurological: Negative for loss of consciousness.  Psychiatric/Behavioral: Negative for memory loss.  All other systems reviewed and are negative.     Allergies  Lipitor and Zetia  Home Medications   Prior to Admission medications   Medication  Sig Start Date End Date Taking? Authorizing Provider  ALPRAZolam (XANAX) 0.25 MG tablet Take 1 tablet (0.25 mg total) by mouth 2 (two) times daily. 05/07/14   Darlin Coco, MD  amLODipine (NORVASC) 5 MG tablet Take 1 tablet (5 mg total) by mouth every morning. 05/07/14   Darlin Coco, MD  aspirin 81 MG tablet Take 81 mg by mouth daily.    Historical Provider, MD  Bevacizumab (AVASTIN IV) Place into the right eye as directed. Every 5 weeks    Historical Provider, MD  citalopram (CELEXA) 10 MG tablet Take 1 tablet (10 mg total) by mouth daily. 05/18/14   Biagio Borg, MD  clopidogrel (PLAVIX) 75 MG tablet Take 1 tablet (75 mg total) by mouth daily. 05/07/14   Darlin Coco, MD  digoxin (LANOXIN) 0.25 MG tablet Take 0.125 mg by mouth daily.  05/02/13   Darlin Coco, MD  famotidine (PEPCID) 20 MG tablet Take 1  tablet (20 mg total) by mouth 2 (two) times daily. 01/04/14   Darlin Coco, MD  FLUZONE HIGH-DOSE 0.5 ML SUSY  04/09/14   Historical Provider, MD  glucose blood (ONE TOUCH ULTRA TEST) test strip Use as directed once daily to check blood sugar.  Diagnosis code E11.42 05/18/14   Biagio Borg, MD  latanoprost (XALATAN) 0.005 % ophthalmic solution Place 1 drop into both eyes at bedtime.     Historical Provider, MD  levothyroxine (SYNTHROID, LEVOTHROID) 100 MCG tablet 1 tablet on Sunday, Tuesday, Thursday, and Saturday.  1 and 1/2 tablets on Monday, Wednesday, and Friday. 05/07/14   Darlin Coco, MD  liothyronine (CYTOMEL) 25 MCG tablet Take 1 tablet (25 mcg total) by mouth every morning. 1 tablet 6 days a week. Pt does not take on sunday 05/07/14   Darlin Coco, MD  metFORMIN (GLUCOPHAGE) 1000 MG tablet Take 1000 mg daily 05/18/14   Biagio Borg, MD  metoprolol succinate (TOPROL-XL) 25 MG 24 hr tablet Take 1 tablet (25 mg total) by mouth every morning. 05/07/14   Darlin Coco, MD  multivitamin-lutein [facility]) CAPS Take 1 capsule by mouth daily.    Historical Provider, MD  sildenafil (REVATIO) 20 MG tablet Take 2-5 tablets by mouth once daily as needed. 05/18/14   Biagio Borg, MD  zolpidem (AMBIEN) 5 MG tablet Take 1 tablet (5 mg total) by mouth at bedtime as needed for sleep. 05/07/14   Darlin Coco, MD   BP 153/65 mmHg  Pulse 70  Temp(Src) 98.4 F (36.9 C) (Oral)  Resp 16  Ht 6\' 1"  (1.854 m)  Wt 170 lb (77.111 kg)  BMI 22.43 kg/m2  SpO2 96% Physical Exam  Constitutional: He is oriented to person, place, and time. He appears well-developed and well-nourished.  HENT:  Head: Normocephalic.  3 cm skin tear/abrasion to L parietal scalp  Eyes: Conjunctivae and EOM are normal.  Neck: Normal range of motion. Neck supple.  Cardiovascular: Normal rate, regular rhythm and normal heart sounds.   Pulmonary/Chest: Effort normal and breath sounds normal. No respiratory distress.   Abdominal: He exhibits no distension. There is no tenderness. There is no rebound and no guarding.  Musculoskeletal: Normal range of motion.  Neurological: He is alert and oriented to person, place, and time.  Skin: Skin is warm and dry.  Vitals reviewed.   ED Course  Procedures (including critical care time) Labs Review Labs Reviewed - No data to display  Imaging Review No results found.   EKG Interpretation None      MDM  Final diagnoses:  Head trauma, initial encounter  Abrasion    81 y.o. male with pertinent PMH of TIA, DM, on plavix presents with wound to parietal scalp from direct blow from car trunk.  No LOC, concussive symtpoms.  No focal neuro deficits.  CT head unremarkable.  Strict return precautions for head trauma given.  DC home in stable condtiion.    I have reviewed all laboratory and imaging studies if ordered as above  1. Head trauma, initial encounter   2. Head trauma   3. Abrasion         Debby Freiberg, MD 06/18/14 [phone]

## 2014-06-19 DIAGNOSIS — H3532 Exudative age-related macular degeneration: Secondary | ICD-10-CM | POA: Diagnosis not present

## 2014-06-19 DIAGNOSIS — H35051 Retinal neovascularization, unspecified, right eye: Secondary | ICD-10-CM | POA: Diagnosis not present

## 2014-06-28 ENCOUNTER — Encounter: Payer: Self-pay | Admitting: Cardiology

## 2014-07-24 DIAGNOSIS — H3532 Exudative age-related macular degeneration: Secondary | ICD-10-CM | POA: Diagnosis not present

## 2014-07-24 DIAGNOSIS — H35051 Retinal neovascularization, unspecified, right eye: Secondary | ICD-10-CM | POA: Diagnosis not present

## 2014-08-14 ENCOUNTER — Other Ambulatory Visit (INDEPENDENT_AMBULATORY_CARE_PROVIDER_SITE_OTHER): Payer: Medicare Other | Admitting: *Deleted

## 2014-08-14 DIAGNOSIS — E1142 Type 2 diabetes mellitus with diabetic polyneuropathy: Secondary | ICD-10-CM | POA: Diagnosis not present

## 2014-08-14 DIAGNOSIS — I119 Hypertensive heart disease without heart failure: Secondary | ICD-10-CM | POA: Diagnosis not present

## 2014-08-14 DIAGNOSIS — G629 Polyneuropathy, unspecified: Secondary | ICD-10-CM | POA: Diagnosis not present

## 2014-08-14 DIAGNOSIS — E038 Other specified hypothyroidism: Secondary | ICD-10-CM | POA: Diagnosis not present

## 2014-08-14 LAB — BASIC METABOLIC PANEL
BUN: 18 mg/dL (ref 6–23)
CHLORIDE: 104 meq/L (ref 96–112)
CO2: 29 mEq/L (ref 19–32)
CREATININE: 1.17 mg/dL (ref 0.40–1.50)
Calcium: 9.4 mg/dL (ref 8.4–10.5)
GFR: 63.12 mL/min (ref >60.00)
GLUCOSE: 138 mg/dL — AB (ref 70–99)
Potassium: 4.1 mEq/L (ref 3.5–5.1)
SODIUM: 139 meq/L (ref 135–145)

## 2014-08-14 LAB — LIPID PANEL
CHOLESTEROL: 167 mg/dL (ref 0–200)
HDL: 59.5 mg/dL (ref >39.00)
LDL CALC: 98 mg/dL (ref 0–99)
NONHDL: 107.5
Total CHOL/HDL Ratio: 3
Triglycerides: 47 mg/dL (ref 0.0–149.0)
VLDL: 9.4 mg/dL (ref 0.0–40.0)

## 2014-08-14 LAB — HEPATIC FUNCTION PANEL
ALBUMIN: 4.4 g/dL (ref 3.5–5.2)
ALT: 19 U/L (ref 0–53)
AST: 21 U/L (ref 0–37)
Alkaline Phosphatase: 73 U/L (ref 39–117)
BILIRUBIN DIRECT: 0.1 mg/dL (ref 0.0–0.3)
TOTAL PROTEIN: 7.1 g/dL (ref 6.0–8.3)
Total Bilirubin: 0.7 mg/dL (ref 0.2–1.2)

## 2014-08-14 LAB — TSH: TSH: 0.06 u[IU]/mL — ABNORMAL LOW (ref 0.35–4.50)

## 2014-08-14 LAB — T4, FREE: Free T4: 0.88 ng/dL (ref 0.60–1.60)

## 2014-08-14 LAB — HEMOGLOBIN A1C: Hgb A1c MFr Bld: 6.7 % — ABNORMAL HIGH (ref 4.6–6.5)

## 2014-08-14 NOTE — Progress Notes (Signed)
Quick Note:  Please make copy of labs for patient visit. ______ 

## 2014-08-17 DIAGNOSIS — Z85828 Personal history of other malignant neoplasm of skin: Secondary | ICD-10-CM | POA: Diagnosis not present

## 2014-08-17 DIAGNOSIS — L309 Dermatitis, unspecified: Secondary | ICD-10-CM | POA: Diagnosis not present

## 2014-08-17 DIAGNOSIS — L820 Inflamed seborrheic keratosis: Secondary | ICD-10-CM | POA: Diagnosis not present

## 2014-08-21 ENCOUNTER — Encounter: Payer: Self-pay | Admitting: Cardiology

## 2014-08-21 ENCOUNTER — Ambulatory Visit (INDEPENDENT_AMBULATORY_CARE_PROVIDER_SITE_OTHER): Payer: Medicare Other | Admitting: Cardiology

## 2014-08-21 VITALS — BP 140/74 | HR 63 | Ht 73.0 in | Wt 178.2 lb

## 2014-08-21 DIAGNOSIS — E1142 Type 2 diabetes mellitus with diabetic polyneuropathy: Secondary | ICD-10-CM

## 2014-08-21 DIAGNOSIS — M5432 Sciatica, left side: Secondary | ICD-10-CM

## 2014-08-21 DIAGNOSIS — E785 Hyperlipidemia, unspecified: Secondary | ICD-10-CM | POA: Diagnosis not present

## 2014-08-21 DIAGNOSIS — G629 Polyneuropathy, unspecified: Secondary | ICD-10-CM

## 2014-08-21 DIAGNOSIS — I119 Hypertensive heart disease without heart failure: Secondary | ICD-10-CM | POA: Diagnosis not present

## 2014-08-21 MED ORDER — LEVOTHYROXINE SODIUM 100 MCG PO TABS: ORAL_TABLET | ORAL | Status: DC

## 2014-08-21 NOTE — Patient Instructions (Signed)
Your physician recommends that you continue on your current medications as directed. Please refer to the Current Medication list given to you today.  Your physician wants you to follow-up in: 4 months with fasting labs (lp/bmet/hfp/A1C)  You will receive a reminder letter in the mail two months in advance. If you Kalev't receive a letter, please call our office to schedule the follow-up appointment.   WORK HARDER ON DIET

## 2014-08-21 NOTE — Progress Notes (Signed)
Cardiology Office Note   Date:  08/21/2014   ID:  Stephen Wilkinson, DOB 1928-09-01, MRN [id number]  PCP:  Cathlean Cower, MD  Cardiologist:   Darlin Coco, MD   No chief complaint on file.     History of Present Illness: Stephen Wilkinson is a 81 y.o. male who presents for a four-month follow-up office visit  This pleasant 81 year old gentleman is seen for a scheduled followup office visit. He has a history of diabetes and high blood pressure. He does not have any history of ischemic heart disease. He has had a past history of palpitations. He has hypothyroidism, BPH and osteoarthritis. He has had both of his knees replaced. Since his last visit he has had no new cardiac symptoms. He and his wife have been reconciled in her marriage and this has taken a lot of stress off of him. He has not been having any chest pain.  He continues to have a lot of problems with his eyesight.  He has macular degeneration. Since we last saw him he has developed sciatica with radiation down the left leg.  He has seen Dr. Dossie Der who has given him a steroid injection which has helped.. Patient is diabetic.  He has not been as careful with his diet.  He has been eating some sweets.  His hemoglobin A1c has gone up slightly.   Past Medical History  Diagnosis Date  . Hypertension   . Hyperlipidemia   . Diabetes mellitus   . Hypothyroidism   . Macular degeneration   . OA (osteoarthritis)   . Anxiety   . Depression   . PVC's (premature ventricular contractions)   . Hx: UTI (urinary tract infection)   . Insomnia   . Stroke     mid 1990s   . Pneumonia   . Chronic kidney disease     hx of uti due to catheter hx of prostatitis, BPH -   . Cancer     basal and squamous skin carcinoma   . Cataract   . Glaucoma   . Blood transfusion without reported diagnosis   . Altered mental status 05/15/2011  . Hypokalemia 05/15/2011  . Acute blood loss anemia 05/15/2011    post surgical  . Glaucoma 01/04/2013  . Hx of transient  ischemic attack (TIA) 01/04/2013  . Macular degeneration   . HTN (hypertension) 03/30/2013  . Skin cancer   . Diabetes type 2, controlled   . Hyperlipidemia     Past Surgical History  Procedure Laterality Date  . Replacement total knee    . Thyroidectomy    . Appendectomy    . Tonsillectomy and adenoidectomy    . Inguinal hernia repair    . US echocardiography  11/10/2006    EF 55-60%  . US echocardiography  09/20/2002    EF 65-70%  . Other surgical history      microwave procedure for prostate - 09/12   . Total knee arthroplasty  05/11/2011    Procedure: TOTAL KNEE ARTHROPLASTY;  Surgeon: Gearlean Alf;  Location: [facility];  Service: Orthopedics;  Laterality: Right;  . Prostate ablation      microwave     Current Outpatient Prescriptions  Medication Sig Dispense Refill  . ALPRAZolam (XANAX) 0.25 MG tablet Take 1 tablet (0.25 mg total) by mouth 2 (two) times daily. 180 tablet 1  . amLODipine (NORVASC) 5 MG tablet Take 1 tablet (5 mg total) by mouth every morning. 90 tablet 3  . aspirin 81 MG  tablet Take 81 mg by mouth daily.    . Bevacizumab (AVASTIN IV) Place into the right eye as directed. Every 5 weeks    . citalopram (CELEXA) 10 MG tablet Take 1 tablet (10 mg total) by mouth daily. 90 tablet 3  . clopidogrel (PLAVIX) 75 MG tablet Take 1 tablet (75 mg total) by mouth daily. 90 tablet 3  . digoxin (LANOXIN) 0.25 MG tablet Take 0.125 mg by mouth daily.     . famotidine (PEPCID) 20 MG tablet Take 1 tablet (20 mg total) by mouth 2 (two) times daily. 180 tablet 3  . FLUZONE HIGH-DOSE 0.5 ML SUSY   0  . glucose blood (ONE TOUCH ULTRA TEST) test strip Use as directed once daily to check blood sugar.  Diagnosis code E11.42 100 each 11  . latanoprost (XALATAN) 0.005 % ophthalmic solution Place 1 drop into both eyes at bedtime.     Marland Kitchen levothyroxine (SYNTHROID, LEVOTHROID) 100 MCG tablet 1 tablet on Sunday, Tuesday, Thursday, and Saturday.  1 and 1/2 tablets on Monday, Wednesday, and  Friday. 120 tablet 3  . liothyronine (CYTOMEL) 25 MCG tablet Take 1 tablet (25 mcg total) by mouth every morning. 1 tablet 6 days a week. Pt does not take on sunday 90 tablet 3  . metFORMIN (GLUCOPHAGE) 1000 MG tablet Take 1000 mg daily 90 tablet 3  . metoprolol succinate (TOPROL-XL) 25 MG 24 hr tablet Take 1 tablet (25 mg total) by mouth every morning. 90 tablet 3  . multivitamin-lutein (OCUVITE-LUTEIN) CAPS Take 1 capsule by mouth daily.    . sildenafil (REVATIO) 20 MG tablet Take 2-5 tablets by mouth once daily as needed. 50 tablet 11  . triamcinolone cream (KENALOG) 0.1 % Apply 1 application topically 2 (two) times daily. Apply in the morning and at night.  0  . zolpidem (AMBIEN) 5 MG tablet Take 1 tablet (5 mg total) by mouth at bedtime as needed for sleep. 90 tablet 1  . [DISCONTINUED] warfarin (COUMADIN) 3 MG tablet Take 1 tablet (3 mg total) by mouth one time only at 6 PM. 35 tablet 0   No current facility-administered medications for this visit.    Allergies:   Lipitor and Zetia    Social History:  The patient  reports that he has never smoked. He has never used smokeless tobacco. He reports that he drinks about 4.2 oz of alcohol per week. He reports that he does not use illicit drugs.   Family History:  The patient's family history includes Arthritis in his mother; Diabetes in his brother and mother; Hypertension in his brother, father, and mother; Kidney disease in his father; Stroke in his father.    ROS:  Please see the history of present illness.   Otherwise, review of systems are positive for none.   All other systems are reviewed and negative.    PHYSICAL EXAM: VS:  BP 140/74 mmHg  Pulse 63  Ht 6\' 1"  (1.854 m)  Wt 178 lb 3.2 oz (80.831 kg)  BMI 23.52 kg/m2 , BMI Body mass index is 23.52 kg/(m^2). GEN: Well nourished, well developed, in no acute distress HEENT: normal Neck: no JVD, carotid bruits, or masses Cardiac: RRR; no murmurs, rubs, or gallops,no edema    Respiratory:  clear to auscultation bilaterally, normal work of breathing GI: soft, nontender, nondistended, + BS MS: no deformity or atrophy Skin: warm and dry, no rash Neuro:  Strength and sensation are intact Psych: euthymic mood, full affect   EKG:  EKG  is not ordered today.    Recent Labs: 08/14/2014: ALT 19; BUN 18; Creatinine 1.17; Potassium 4.1; Sodium 139; TSH 0.06*    Lipid Panel    Component Value Date/Time   CHOL 167 08/14/2014 0739   TRIG 47.0 08/14/2014 0739   HDL 59.50 08/14/2014 0739   CHOLHDL 3 08/14/2014 0739   VLDL 9.4 08/14/2014 0739   LDLCALC 98 08/14/2014 0739      Wt Readings from Last 3 Encounters:  08/21/14 178 lb 3.2 oz (80.831 kg)  06/12/14 178 lb 8 oz (80.967 kg)  06/08/14 170 lb (77.111 kg)        ASSESSMENT AND PLAN:  1. Essential hypertension without heart failure 2. Diabetes mellitus type 2 with diabetic peripheral neuropathy. 3. Visual impairment secondary to macular degeneration. 4. Hypothyroidism 5. BPH 6. Osteoarthritis 7. Past history of TIA 8.sciatica with left leg radiation     Current medicines are reviewed at length with the patient today.  The patient does not have concerns regarding medicines.  The following changes have been made:  no change  Labs/ tests ordered today include:   Orders Placed This Encounter  Procedures  . Lipid panel  . Hepatic function panel  . Basic metabolic panel  . Hemoglobin A1c     Disposition: Continue  current medication.  Work harder on diet and weight loss.  Recheck in 4 months for office visit lipid panel hepatic function panel basal metabolic panel and hemoglobin A1c  Signed, Darlin Coco, MD  08/21/2014 1:51 PM    [facility] [place], [place], [place]  [address] Phone: [phone]; Fax: [phone]

## 2014-08-29 DIAGNOSIS — H40053 Ocular hypertension, bilateral: Secondary | ICD-10-CM | POA: Diagnosis not present

## 2014-08-30 ENCOUNTER — Encounter: Payer: Self-pay | Admitting: Podiatrist

## 2014-08-30 ENCOUNTER — Ambulatory Visit: Payer: Medicare Other | Admitting: Podiatrist

## 2014-08-30 ENCOUNTER — Ambulatory Visit (INDEPENDENT_AMBULATORY_CARE_PROVIDER_SITE_OTHER): Payer: Medicare Other | Admitting: Podiatrist

## 2014-08-30 DIAGNOSIS — B351 Tinea unguium: Secondary | ICD-10-CM

## 2014-08-30 DIAGNOSIS — Q828 Other specified congenital malformations of skin: Secondary | ICD-10-CM

## 2014-08-30 DIAGNOSIS — E1140 Type 2 diabetes mellitus with diabetic neuropathy, unspecified: Secondary | ICD-10-CM | POA: Diagnosis not present

## 2014-08-30 DIAGNOSIS — M79676 Pain in unspecified toe(s): Secondary | ICD-10-CM

## 2014-08-30 DIAGNOSIS — M216X9 Other acquired deformities of unspecified foot: Secondary | ICD-10-CM

## 2014-08-30 NOTE — Progress Notes (Signed)
   Subjective:    Patient ID: Stephen Wilkinson, male    DOB: Dec 12, 1928, 81 y.o.   MRN: [id number]  HPI DEBRIDE 10 toes and callous   Review of Systems  All other systems reviewed and are negative.      Objective:   Physical Exam        Assessment & Plan:

## 2014-09-04 DIAGNOSIS — H3532 Exudative age-related macular degeneration: Secondary | ICD-10-CM | POA: Diagnosis not present

## 2014-09-13 NOTE — Progress Notes (Signed)
HPI: Patient presents today for follow up of foot and nail care. Denies any new complaints today. He does continue to have neuropathy and states his mother and grandmother ended up with amputations due to circulation and neuropathy.  Objective: Patients chart is reviewed. Neurovascular status unchanged. Patients nails are thickened, discolored, distrophic, friable and brittle with yellow-brown discoloration. Patient subjectively relates they are painful with shoes and with ambulation of bilateral feet. He also has intractable porokeratotic lesions submet 5 right and submet 1 right. Pedal pulses palpable and strong bilateral  Assessment: Symptomatic onychomycosis, diabetes with neuropathy, plantar flexed metatarsals, pororkeratotic lesions x 2  Plan: Discussed treatment options and alternatives. The symptomatic toenails were debrided through manual an mechanical means without complication. The lesions were debrided without complication.

## 2014-09-28 DIAGNOSIS — Z85828 Personal history of other malignant neoplasm of skin: Secondary | ICD-10-CM | POA: Diagnosis not present

## 2014-09-28 DIAGNOSIS — L239 Allergic contact dermatitis, unspecified cause: Secondary | ICD-10-CM | POA: Diagnosis not present

## 2014-10-10 ENCOUNTER — Ambulatory Visit (INDEPENDENT_AMBULATORY_CARE_PROVIDER_SITE_OTHER): Payer: Medicare Other | Admitting: Internal Medicine

## 2014-10-10 ENCOUNTER — Encounter: Payer: Self-pay | Admitting: Internal Medicine

## 2014-10-10 VITALS — BP 124/80 | HR 82 | Temp 99.7°F | Resp 18 | Ht 73.0 in | Wt 172.0 lb

## 2014-10-10 DIAGNOSIS — I10 Essential (primary) hypertension: Secondary | ICD-10-CM

## 2014-10-10 DIAGNOSIS — G629 Polyneuropathy, unspecified: Secondary | ICD-10-CM

## 2014-10-10 DIAGNOSIS — R05 Cough: Secondary | ICD-10-CM | POA: Diagnosis not present

## 2014-10-10 DIAGNOSIS — E1142 Type 2 diabetes mellitus with diabetic polyneuropathy: Secondary | ICD-10-CM

## 2014-10-10 DIAGNOSIS — R059 Cough, unspecified: Secondary | ICD-10-CM

## 2014-10-10 MED ORDER — AZITHROMYCIN 250 MG PO TABS: ORAL_TABLET | ORAL | Status: DC

## 2014-10-10 MED ORDER — HYDROCODONE-HOMATROPINE 5-1.5 MG/5ML PO SYRP: 5.0000 mL | ORAL_SOLUTION | Freq: Four times a day (QID) | ORAL | Status: DC | PRN

## 2014-10-10 NOTE — Progress Notes (Signed)
Subjective:    Patient ID: Stephen Wilkinson, male    DOB: 03-Oct-1928, 81 y.o.   MRN: [id number]  HPI  Here with acute onset mild to mod 2-3 days ST, HA, general weakness and malaise, with prod cough greenish sputum, but Pt denies chest pain, increased sob or doe, wheezing, orthopnea, PND, increased LE swelling, palpitations, dizziness or syncope. Pt denies new neurological symptoms such as new headache, or facial or extremity weakness or numbness   Pt denies polydipsia, polyuria Past Medical History  Diagnosis Date  . Hypertension   . Hyperlipidemia   . Diabetes mellitus   . Hypothyroidism   . Macular degeneration   . OA (osteoarthritis)   . Anxiety   . Depression   . PVC's (premature ventricular contractions)   . Hx: UTI (urinary tract infection)   . Insomnia   . Stroke     mid 1990s   . Pneumonia   . Chronic kidney disease     hx of uti due to catheter hx of prostatitis, BPH -   . Cancer     basal and squamous skin carcinoma   . Cataract   . Glaucoma   . Blood transfusion without reported diagnosis   . Altered mental status 05/15/2011  . Hypokalemia 05/15/2011  . Acute blood loss anemia 05/15/2011    post surgical  . Glaucoma 01/04/2013  . Hx of transient ischemic attack (TIA) 01/04/2013  . Macular degeneration   . HTN (hypertension) 03/30/2013  . Skin cancer   . Diabetes type 2, controlled   . Hyperlipidemia    Past Surgical History  Procedure Laterality Date  . Replacement total knee    . Thyroidectomy    . Appendectomy    . Tonsillectomy and adenoidectomy    . Inguinal hernia repair    . US echocardiography  11/10/2006    EF 55-60%  . US echocardiography  09/20/2002    EF 65-70%  . Other surgical history      microwave procedure for prostate - 09/12   . Total knee arthroplasty  05/11/2011    Procedure: TOTAL KNEE ARTHROPLASTY;  Surgeon: Gearlean Alf;  Location: [facility];  Service: Orthopedics;  Laterality: Right;  . Prostate ablation      microwave    reports that  he has never smoked. He has never used smokeless tobacco. He reports that he drinks about 4.2 oz of alcohol per week. He reports that he does not use illicit drugs. family history includes Arthritis in his mother; Diabetes in his brother and mother; Hypertension in his brother, father, and mother; Kidney disease in his father; Stroke in his father. Allergies  Allergen Reactions  . Lipitor [Atorvastatin Calcium] Other (See Comments)    Myalgia  . Zetia [Ezetimibe] Other (See Comments)    Myalgia   Current Outpatient Prescriptions on File Prior to Visit  Medication Sig Dispense Refill  . ALPRAZolam (XANAX) 0.25 MG tablet Take 1 tablet (0.25 mg total) by mouth 2 (two) times daily. 180 tablet 1  . amLODipine (NORVASC) 5 MG tablet Take 1 tablet (5 mg total) by mouth every morning. 90 tablet 3  . aspirin 81 MG tablet Take 81 mg by mouth daily.    . Bevacizumab (AVASTIN IV) Place into the right eye as directed. Every 5 weeks    . citalopram (CELEXA) 10 MG tablet Take 1 tablet (10 mg total) by mouth daily. 90 tablet 3  . clopidogrel (PLAVIX) 75 MG tablet Take 1 tablet (75 mg  total) by mouth daily. 90 tablet 3  . famotidine (PEPCID) 20 MG tablet Take 1 tablet (20 mg total) by mouth 2 (two) times daily. 180 tablet 3  . FLUZONE HIGH-DOSE 0.5 ML SUSY   0  . glucose blood (ONE TOUCH ULTRA TEST) test strip Use as directed once daily to check blood sugar.  Diagnosis code E11.42 100 each 11  . latanoprost (XALATAN) 0.005 % ophthalmic solution Place 1 drop into both eyes at bedtime.     Marland Kitchen levothyroxine (SYNTHROID, LEVOTHROID) 100 MCG tablet 1 tablet on Sunday, Tuesday, Thursday, and Saturday.  1 and 1/2 tablets on Monday, Wednesday, and Friday. 120 tablet 3  . liothyronine (CYTOMEL) 25 MCG tablet Take 1 tablet (25 mcg total) by mouth every morning. 1 tablet 6 days a week. Pt does not take on sunday 90 tablet 3  . metFORMIN (GLUCOPHAGE) 1000 MG tablet Take 1000 mg daily 90 tablet 3  . metoprolol succinate  (TOPROL-XL) 25 MG 24 hr tablet Take 1 tablet (25 mg total) by mouth every morning. 90 tablet 3  . multivitamin-lutein (OCUVITE-LUTEIN) CAPS Take 1 capsule by mouth daily.    . sildenafil (REVATIO) 20 MG tablet Take 2-5 tablets by mouth once daily as needed. 50 tablet 11  . triamcinolone cream (KENALOG) 0.1 % Apply 1 application topically 2 (two) times daily. Apply in the morning and at night.  0  . zolpidem (AMBIEN) 5 MG tablet Take 1 tablet (5 mg total) by mouth at bedtime as needed for sleep. 90 tablet 1  . [DISCONTINUED] warfarin (COUMADIN) 3 MG tablet Take 1 tablet (3 mg total) by mouth one time only at 6 PM. 35 tablet 0   No current facility-administered medications on file prior to visit.   Review of Systems  Constitutional: Negative for unusual diaphoresis or night sweats HENT: Negative for ringing in ear or discharge Eyes: Negative for double vision or worsening visual disturbance.  Respiratory: Negative for choking and stridor.   Gastrointestinal: Negative for vomiting or other signifcant bowel change Genitourinary: Negative for hematuria or change in urine volume.  Musculoskeletal: Negative for other MSK pain or swelling Skin: Negative for color change and worsening wound.  Neurological: Negative for tremors and numbness other than noted  Psychiatric/Behavioral: Negative for decreased concentration or agitation other than above        Objective:   Physical Exam BP 124/80 mmHg  Pulse 82  Temp(Src) 99.7 F (37.6 C) (Oral)  Resp 18  Ht 6\' 1"  (1.854 m)  Wt 172 lb 0.6 oz (78.037 kg)  BMI 22.70 kg/m2  SpO2 96% VS noted, mild ill Constitutional: Pt appears in no significant distress HENT: Head: NCAT.  Right Ear: External ear normal.  Left Ear: External ear normal. Bilat tm's with mild erythema.  Max sinus areas non tender.  Pharynx with mild erythema, no exudate Eyes: . Pupils are equal, round, and reactive to light. Conjunctivae and EOM are normal Neck: Normal range of  motion. Neck supple.  Cardiovascular: Normal rate and regular rhythm.   Pulmonary/Chest: Effort normal and breath sounds some decreased without rales or wheezing.  Neurological: Pt is alert. Not confused , motor grossly intact Skin: Skin is warm. No rash, no LE edema Psychiatric: Pt behavior is normal. No agitation.      Assessment & Plan:

## 2014-10-10 NOTE — Progress Notes (Signed)
Pre visit review using our clinic review tool, if applicable. No additional management support is needed unless otherwise documented below in the visit note. 

## 2014-10-10 NOTE — Patient Instructions (Signed)
Please take all new medication as prescribed - the antibiotic, and cough medicine  Please continue all other medications as before, and refills have been done if requested.  Please have the pharmacy call with any other refills you may need.  Please keep your appointments with your specialists as you may have planned      

## 2014-10-10 NOTE — Assessment & Plan Note (Signed)
stable overall by history and exam, recent data reviewed with pt, and pt to continue medical treatment as before,  to f/u any worsening symptoms or concerns BP Readings from Last 3 Encounters:  10/10/14 124/80  08/21/14 140/74  06/12/14 140/82

## 2014-10-10 NOTE — Assessment & Plan Note (Signed)
C/with likely bronchitis acute vs pna, declines cxr, for zpack, cough med prn,  to f/u any worsening symptoms or concerns

## 2014-10-10 NOTE — Assessment & Plan Note (Signed)
stable overall by history and exam, recent data reviewed with pt, and pt to continue medical treatment as before,  to f/u any worsening symptoms or concerns Lab Results  Component Value Date   HGBA1C 6.7* 08/14/2014

## 2014-10-16 DIAGNOSIS — H3532 Exudative age-related macular degeneration: Secondary | ICD-10-CM | POA: Diagnosis not present

## 2014-10-26 DIAGNOSIS — L259 Unspecified contact dermatitis, unspecified cause: Secondary | ICD-10-CM | POA: Diagnosis not present

## 2014-11-20 ENCOUNTER — Encounter: Payer: Self-pay | Admitting: Internal Medicine

## 2014-11-20 ENCOUNTER — Ambulatory Visit (INDEPENDENT_AMBULATORY_CARE_PROVIDER_SITE_OTHER): Payer: Medicare Other | Admitting: Internal Medicine

## 2014-11-20 VITALS — BP 140/76 | HR 73 | Temp 98.3°F | Wt 175.0 lb

## 2014-11-20 DIAGNOSIS — E785 Hyperlipidemia, unspecified: Secondary | ICD-10-CM

## 2014-11-20 DIAGNOSIS — F418 Other specified anxiety disorders: Secondary | ICD-10-CM | POA: Diagnosis not present

## 2014-11-20 DIAGNOSIS — I10 Essential (primary) hypertension: Secondary | ICD-10-CM | POA: Diagnosis not present

## 2014-11-20 DIAGNOSIS — F329 Major depressive disorder, single episode, unspecified: Secondary | ICD-10-CM

## 2014-11-20 DIAGNOSIS — R21 Rash and other nonspecific skin eruption: Secondary | ICD-10-CM

## 2014-11-20 DIAGNOSIS — F32A Depression, unspecified: Secondary | ICD-10-CM

## 2014-11-20 DIAGNOSIS — F419 Anxiety disorder, unspecified: Secondary | ICD-10-CM

## 2014-11-20 MED ORDER — HYDROXYZINE HCL 25 MG PO TABS: 25.0000 mg | ORAL_TABLET | Freq: Three times a day (TID) | ORAL | Status: DC | PRN

## 2014-11-20 MED ORDER — SILDENAFIL CITRATE 20 MG PO TABS: ORAL_TABLET | ORAL | Status: DC

## 2014-11-20 MED ORDER — METFORMIN HCL 1000 MG PO TABS: ORAL_TABLET | ORAL | Status: DC

## 2014-11-20 NOTE — Assessment & Plan Note (Signed)
stable overall by history and exam, recent data reviewed with pt, and pt to continue medical treatment as before,  to f/u any worsening symptoms or concerns Lab Results  Component Value Date   LDLCALC 98 08/14/2014

## 2014-11-20 NOTE — Assessment & Plan Note (Signed)
Likely atopic dermatitis, has derm fu next wk, for atarax prn itching,  to f/u any worsening symptoms or concerns

## 2014-11-20 NOTE — Patient Instructions (Signed)
Please take all new medication as prescribed - the hydroxyzine, which can help with itching, as well as anxiety  Please continue all other medications as before, and refills have been done if requested.  Please have the pharmacy call with any other refills you may need.  Please continue your efforts at being more active, low cholesterol diet, and weight control.  You are otherwise up to date with prevention measures today.  Please keep your appointments with your specialists as you may have planned  Please return in 6 months, or sooner if needed

## 2014-11-20 NOTE — Progress Notes (Signed)
Subjective:    Patient ID: Stephen Wilkinson, male    DOB: 1928/07/18, 81 y.o.   MRN: [id number]  HPI  Here for yearly f/u;  Overall doing ok;  Pt denies Chest pain, worsening SOB, DOE, wheezing, orthopnea, PND, worsening LE edema, palpitations, dizziness or syncope.  Pt denies neurological change such as new headache, facial or extremity weakness.  Pt denies polydipsia, polyuria, or low sugar symptoms. Pt states overall good compliance with treatment and medications, good tolerability, and has been trying to follow appropriate diet.  Pt denies worsening depressive symptoms, suicidal ideation or panic., though has ongoing anxiety No fever, night sweats, wt loss, loss of appetite, or other constitutional symptoms.  Pt states good ability with ADL's, has low fall risk, home safety reviewed and adequate, no other significant changes in hearing or vision, remains with bilat macular degeneration.  Antibiotic seemed to help greatly last visit with cough/resp illness.  Recently has rash with itching worsening to arms, has seen dermatologist who is retiring and plans to to change with new derm next wk, dx with atopic dermatitis per pt, with worse sleeping/itching at night, asking for itch med, already beeing tx with topical steroid clobetasol. , thinks it may be causing blurry vision as well.  Tends to scratch until he bleeds to arms.  Wondering about med allergy as well. , no recent med or food change. Weaning himself off ambien now down to 1/4-1/2 pill qhs prn.  Past Medical History  Diagnosis Date  . Hypertension   . Hyperlipidemia   . Diabetes mellitus   . Hypothyroidism   . Macular degeneration   . OA (osteoarthritis)   . Anxiety   . Depression   . PVC's (premature ventricular contractions)   . Hx: UTI (urinary tract infection)   . Insomnia   . Stroke     mid 1990s   . Pneumonia   . Chronic kidney disease     hx of uti due to catheter hx of prostatitis, BPH -   . Cancer     basal and squamous skin  carcinoma   . Cataract   . Glaucoma   . Blood transfusion without reported diagnosis   . Altered mental status 05/15/2011  . Hypokalemia 05/15/2011  . Acute blood loss anemia 05/15/2011    post surgical  . Glaucoma 01/04/2013  . Hx of transient ischemic attack (TIA) 01/04/2013  . Macular degeneration   . HTN (hypertension) 03/30/2013  . Skin cancer   . Diabetes type 2, controlled   . Hyperlipidemia    Past Surgical History  Procedure Laterality Date  . Replacement total knee    . Thyroidectomy    . Appendectomy    . Tonsillectomy and adenoidectomy    . Inguinal hernia repair    . US echocardiography  11/10/2006    EF 55-60%  . US echocardiography  09/20/2002    EF 65-70%  . Other surgical history      microwave procedure for prostate - 09/12   . Total knee arthroplasty  05/11/2011    Procedure: TOTAL KNEE ARTHROPLASTY;  Surgeon: Gearlean Alf;  Location: [facility];  Service: Orthopedics;  Laterality: Right;  . Prostate ablation      microwave    reports that he has never smoked. He has never used smokeless tobacco. He reports that he drinks about 4.2 oz of alcohol per week. He reports that he does not use illicit drugs. family history includes Arthritis in his mother; Diabetes in his  brother and mother; Hypertension in his brother, father, and mother; Kidney disease in his father; Stroke in his father. Allergies  Allergen Reactions  . Lipitor [Atorvastatin Calcium] Other (See Comments)    Myalgia  . Zetia [Ezetimibe] Other (See Comments)    Myalgia   Current Outpatient Prescriptions on File Prior to Visit  Medication Sig Dispense Refill  . ALPRAZolam (XANAX) 0.25 MG tablet Take 1 tablet (0.25 mg total) by mouth 2 (two) times daily. 180 tablet 1  . amLODipine (NORVASC) 5 MG tablet Take 1 tablet (5 mg total) by mouth every morning. 90 tablet 3  . aspirin 81 MG tablet Take 81 mg by mouth daily.    Marland Kitchen azithromycin (ZITHROMAX Z-PAK) 250 MG tablet Use as directed (Patient not  taking: Reported on 11/20/2014) 6 tablet 1  . Bevacizumab (AVASTIN IV) Place into the right eye as directed. Every 5 weeks    . citalopram (CELEXA) 10 MG tablet Take 1 tablet (10 mg total) by mouth daily. 90 tablet 3  . clopidogrel (PLAVIX) 75 MG tablet Take 1 tablet (75 mg total) by mouth daily. 90 tablet 3  . digoxin (LANOXIN) 0.125 MG tablet Take 0.125 mg by mouth daily.    . famotidine (PEPCID) 20 MG tablet Take 1 tablet (20 mg total) by mouth 2 (two) times daily. 180 tablet 3  . FLUZONE HIGH-DOSE 0.5 ML SUSY   0  . glucose blood (ONE TOUCH ULTRA TEST) test strip Use as directed once daily to check blood sugar.  Diagnosis code E11.42 100 each 11  . HYDROcodone-homatropine (HYCODAN) 5-1.5 MG/5ML syrup Take 5 mLs by mouth every 6 (six) hours as needed for cough. (Patient not taking: Reported on 11/20/2014) 180 mL 0  . latanoprost (XALATAN) 0.005 % ophthalmic solution Place 1 drop into both eyes at bedtime.     Marland Kitchen levothyroxine (SYNTHROID, LEVOTHROID) 100 MCG tablet 1 tablet on Sunday, Tuesday, Thursday, and Saturday.  1 and 1/2 tablets on Monday, Wednesday, and Friday. 120 tablet 3  . liothyronine (CYTOMEL) 25 MCG tablet Take 1 tablet (25 mcg total) by mouth every morning. 1 tablet 6 days a week. Pt does not take on sunday 90 tablet 3  . metoprolol succinate (TOPROL-XL) 25 MG 24 hr tablet Take 1 tablet (25 mg total) by mouth every morning. 90 tablet 3  . multivitamin-lutein (OCUVITE-LUTEIN) CAPS Take 1 capsule by mouth daily.    Marland Kitchen triamcinolone cream (KENALOG) 0.1 % Apply 1 application topically 2 (two) times daily. Apply in the morning and at night.  0  . zolpidem (AMBIEN) 5 MG tablet Take 1 tablet (5 mg total) by mouth at bedtime as needed for sleep. 90 tablet 1  . [DISCONTINUED] warfarin (COUMADIN) 3 MG tablet Take 1 tablet (3 mg total) by mouth one time only at 6 PM. 35 tablet 0   No current facility-administered medications on file prior to visit.   Review of Systems Constitutional: Negative  for increased diaphoresis, other activity, appetite or siginficant weight change other than noted HENT: Negative for worsening hearing loss, ear pain, facial swelling, mouth sores and neck stiffness.   Eyes: Negative for other worsening pain, redness or visual disturbance.  Respiratory: Negative for shortness of breath and wheezing  Cardiovascular: Negative for chest pain and palpitations.  Gastrointestinal: Negative for diarrhea, blood in stool, abdominal distention or other pain Genitourinary: Negative for hematuria, flank pain or change in urine volume.  Musculoskeletal: Negative for myalgias or other joint complaints.  Skin: Negative for color change and  wound or drainage.  Neurological: Negative for syncope and numbness. other than noted Hematological: Negative for adenopathy. or other swelling Psychiatric/Behavioral: Negative for hallucinations, SI, self-injury, decreased concentration or other worsening agitation.      Objective:   Physical Exam BP 140/76 mmHg  Pulse 73  Temp(Src) 98.3 F (36.8 C) (Oral)  Wt 175 lb 0.6 oz (79.398 kg)  SpO2 95% VS noted,  Constitutional: Pt is oriented to person, place, and time. Appears well-developed and well-nourished, in no significant distress Head: Normocephalic and atraumatic.  Right Ear: External ear normal.  Left Ear: External ear normal.  Nose: Nose normal.  Mouth/Throat: Oropharynx is clear and moist.  Eyes: Conjunctivae and EOM are normal. Pupils are equal, round, and reactive to light.  Neck: Normal range of motion. Neck supple. No JVD present. No tracheal deviation present or significant neck LA or mass Cardiovascular: Normal rate, regular rhythm, normal heart sounds and intact distal pulses.   Pulmonary/Chest: Effort normal and breath sounds without rales or wheezing  Abdominal: Soft. Bowel sounds are normal. NT. No HSM  Musculoskeletal: Normal range of motion. Exhibits no edema.  Lymphadenopathy:  Has no cervical adenopathy.    Neurological: Pt is alert and oriented to person, place, and time. Pt has normal reflexes. No cranial nerve deficit. Motor grossly intact Skin: Skin is warm and dry. + mild erythema rash to upper ext's noted.  Psychiatric:  Has mild nervous mood and affect. Behavior is normal.     Assessment & Plan:

## 2014-11-20 NOTE — Assessment & Plan Note (Signed)
Atarax to help with anxiety as well,  to f/u any worsening symptoms or concerns

## 2014-11-20 NOTE — Progress Notes (Signed)
Pre visit review using our clinic review tool, if applicable. No additional management support is needed unless otherwise documented below in the visit note. 

## 2014-11-20 NOTE — Assessment & Plan Note (Signed)
stable overall by history and exam, recent data reviewed with pt, and pt to continue medical treatment as before,  to f/u any worsening symptoms or concerns BP Readings from Last 3 Encounters:  11/20/14 140/76  10/10/14 124/80  08/21/14 140/74

## 2014-11-27 DIAGNOSIS — H3532 Exudative age-related macular degeneration: Secondary | ICD-10-CM | POA: Diagnosis not present

## 2014-11-28 ENCOUNTER — Ambulatory Visit: Payer: Medicare Other | Admitting: Podiatry

## 2014-11-28 DIAGNOSIS — R42 Dizziness and giddiness: Secondary | ICD-10-CM | POA: Diagnosis not present

## 2014-11-28 DIAGNOSIS — H6123 Impacted cerumen, bilateral: Secondary | ICD-10-CM | POA: Diagnosis not present

## 2014-11-28 DIAGNOSIS — H9193 Unspecified hearing loss, bilateral: Secondary | ICD-10-CM | POA: Diagnosis not present

## 2014-11-30 DIAGNOSIS — D225 Melanocytic nevi of trunk: Secondary | ICD-10-CM | POA: Diagnosis not present

## 2014-11-30 DIAGNOSIS — Z1283 Encounter for screening for malignant neoplasm of skin: Secondary | ICD-10-CM | POA: Diagnosis not present

## 2014-11-30 DIAGNOSIS — C4442 Squamous cell carcinoma of skin of scalp and neck: Secondary | ICD-10-CM | POA: Diagnosis not present

## 2014-11-30 DIAGNOSIS — L259 Unspecified contact dermatitis, unspecified cause: Secondary | ICD-10-CM | POA: Diagnosis not present

## 2014-11-30 DIAGNOSIS — X32XXXA Exposure to sunlight, initial encounter: Secondary | ICD-10-CM | POA: Diagnosis not present

## 2014-11-30 DIAGNOSIS — L570 Actinic keratosis: Secondary | ICD-10-CM | POA: Diagnosis not present

## 2014-12-07 ENCOUNTER — Encounter: Payer: Self-pay | Admitting: Podiatry

## 2014-12-07 ENCOUNTER — Ambulatory Visit (INDEPENDENT_AMBULATORY_CARE_PROVIDER_SITE_OTHER): Payer: Medicare Other | Admitting: Podiatry

## 2014-12-07 DIAGNOSIS — G629 Polyneuropathy, unspecified: Secondary | ICD-10-CM

## 2014-12-07 DIAGNOSIS — B351 Tinea unguium: Secondary | ICD-10-CM | POA: Diagnosis not present

## 2014-12-07 DIAGNOSIS — E1142 Type 2 diabetes mellitus with diabetic polyneuropathy: Secondary | ICD-10-CM

## 2014-12-07 DIAGNOSIS — M79676 Pain in unspecified toe(s): Secondary | ICD-10-CM | POA: Diagnosis not present

## 2014-12-07 DIAGNOSIS — Q828 Other specified congenital malformations of skin: Secondary | ICD-10-CM | POA: Diagnosis not present

## 2014-12-07 DIAGNOSIS — E1140 Type 2 diabetes mellitus with diabetic neuropathy, unspecified: Secondary | ICD-10-CM

## 2014-12-07 NOTE — Progress Notes (Signed)
HPI: Patient presents today for follow up of foot and nail care. Denies any new complaints today. He does continue to have neuropathy and states his mother and grandmother ended up with amputations due to circulation and neuropathy.  Objective: Patients chart is reviewed. Neurovascular status unchanged. Patients nails are thickened, discolored, distrophic, friable and brittle with yellow-brown discoloration. Patient subjectively relates they are painful with shoes and with ambulation of bilateral feet. He also has intractable porokeratotic lesions submet 5 right and submet 1 right. Pedal pulses palpable and strong bilateral  Assessment: Symptomatic onychomycosis, diabetes with neuropathy, plantar flexed metatarsals, pororkeratotic lesions x 2  Plan: Discussed treatment options and alternatives. The symptomatic toenails were debrided through manual an mechanical means without complication. The lesions were debrided without complication.

## 2014-12-14 DIAGNOSIS — Z85828 Personal history of other malignant neoplasm of skin: Secondary | ICD-10-CM | POA: Diagnosis not present

## 2014-12-14 DIAGNOSIS — Z08 Encounter for follow-up examination after completed treatment for malignant neoplasm: Secondary | ICD-10-CM | POA: Diagnosis not present

## 2014-12-19 ENCOUNTER — Other Ambulatory Visit (INDEPENDENT_AMBULATORY_CARE_PROVIDER_SITE_OTHER): Payer: Medicare Other | Admitting: *Deleted

## 2014-12-19 DIAGNOSIS — E1142 Type 2 diabetes mellitus with diabetic polyneuropathy: Secondary | ICD-10-CM | POA: Diagnosis not present

## 2014-12-19 DIAGNOSIS — E785 Hyperlipidemia, unspecified: Secondary | ICD-10-CM | POA: Diagnosis not present

## 2014-12-19 DIAGNOSIS — G629 Polyneuropathy, unspecified: Secondary | ICD-10-CM | POA: Diagnosis not present

## 2014-12-19 DIAGNOSIS — I119 Hypertensive heart disease without heart failure: Secondary | ICD-10-CM

## 2014-12-19 LAB — LIPID PANEL
CHOLESTEROL: 163 mg/dL (ref 0–200)
HDL: 54.2 mg/dL (ref >39.00)
LDL Cholesterol: 98 mg/dL (ref 0–99)
NONHDL: 108.8
Total CHOL/HDL Ratio: 3
Triglycerides: 53 mg/dL (ref 0.0–149.0)
VLDL: 10.6 mg/dL (ref 0.0–40.0)

## 2014-12-19 LAB — BASIC METABOLIC PANEL
BUN: 20 mg/dL (ref 6–23)
CHLORIDE: 104 meq/L (ref 96–112)
CO2: 28 mEq/L (ref 19–32)
Calcium: 9.4 mg/dL (ref 8.4–10.5)
Creatinine, Ser: 1.17 mg/dL (ref 0.40–1.50)
GFR: 63.06 mL/min (ref >60.00)
Glucose, Bld: 121 mg/dL — ABNORMAL HIGH (ref 70–99)
Potassium: 4.3 mEq/L (ref 3.5–5.1)
Sodium: 139 mEq/L (ref 135–145)

## 2014-12-19 LAB — HEPATIC FUNCTION PANEL
ALT: 17 U/L (ref 0–53)
AST: 20 U/L (ref 0–37)
Albumin: 4.1 g/dL (ref 3.5–5.2)
Alkaline Phosphatase: 71 U/L (ref 39–117)
BILIRUBIN DIRECT: 0.2 mg/dL (ref 0.0–0.3)
TOTAL PROTEIN: 7.1 g/dL (ref 6.0–8.3)
Total Bilirubin: 0.7 mg/dL (ref 0.2–1.2)

## 2014-12-19 LAB — HEMOGLOBIN A1C: HEMOGLOBIN A1C: 6.4 % (ref 4.6–6.5)

## 2014-12-19 NOTE — Progress Notes (Signed)
Quick Note:  Please make copy of labs for patient visit. ______ 

## 2014-12-24 ENCOUNTER — Encounter: Payer: Self-pay | Admitting: Cardiology

## 2014-12-24 ENCOUNTER — Ambulatory Visit (INDEPENDENT_AMBULATORY_CARE_PROVIDER_SITE_OTHER): Payer: Medicare Other | Admitting: Cardiology

## 2014-12-24 VITALS — BP 128/60 | HR 57 | Ht 73.0 in | Wt 174.8 lb

## 2014-12-24 DIAGNOSIS — E038 Other specified hypothyroidism: Secondary | ICD-10-CM | POA: Diagnosis not present

## 2014-12-24 DIAGNOSIS — I119 Hypertensive heart disease without heart failure: Secondary | ICD-10-CM

## 2014-12-24 DIAGNOSIS — F419 Anxiety disorder, unspecified: Secondary | ICD-10-CM

## 2014-12-24 DIAGNOSIS — E785 Hyperlipidemia, unspecified: Secondary | ICD-10-CM

## 2014-12-24 DIAGNOSIS — E034 Atrophy of thyroid (acquired): Secondary | ICD-10-CM

## 2014-12-24 DIAGNOSIS — G629 Polyneuropathy, unspecified: Secondary | ICD-10-CM

## 2014-12-24 DIAGNOSIS — E1142 Type 2 diabetes mellitus with diabetic polyneuropathy: Secondary | ICD-10-CM | POA: Diagnosis not present

## 2014-12-24 MED ORDER — ALPRAZOLAM 0.25 MG PO TABS: 0.2500 mg | ORAL_TABLET | Freq: Two times a day (BID) | ORAL | Status: DC

## 2014-12-24 NOTE — Progress Notes (Signed)
Cardiology Office Note   Date:  12/24/2014   ID:  Stephen Wilkinson, DOB 29-Oct-1928, MRN [id number]  PCP:  Cathlean Cower, MD  Cardiologist: Darlin Coco MD  No chief complaint on file.     History of Present Illness: Stephen Wilkinson is a 81 y.o. male who presents for scheduled four-month follow-up office visit.  This pleasant 81 year old gentleman is seen for a scheduled followup office visit. He has a history of diabetes and high blood pressure. He does not have any history of ischemic heart disease. He has had a past history of palpitations. He has hypothyroidism, BPH and osteoarthritis. He has had both of his knees replaced. Since his last visit he has had no new cardiac symptoms. He and his wife have been reconciled in her marriage and this has taken a lot of stress off of him. He has not been having any chest pain. He continues to have a lot of problems with his eyesight. He has macular degeneration. Since last visit the patient has had a lot of skin cancers treated on his scalp by his dermatologist Dr. Nevada Crane. Patient is diabetic. His hemoglobin A1c has improved since last visit. The patient denies any chest pain or shortness of breath.  He has been walking for exercise.  He has been sleeping better since he has been getting more exercise.  Past Medical History  Diagnosis Date  . Hypertension   . Hyperlipidemia   . Diabetes mellitus   . Hypothyroidism   . Macular degeneration   . OA (osteoarthritis)   . Anxiety   . Depression   . PVC's (premature ventricular contractions)   . Hx: UTI (urinary tract infection)   . Insomnia   . Stroke     mid 1990s   . Pneumonia   . Chronic kidney disease     hx of uti due to catheter hx of prostatitis, BPH -   . Cancer     basal and squamous skin carcinoma   . Cataract   . Glaucoma   . Blood transfusion without reported diagnosis   . Altered mental status 05/15/2011  . Hypokalemia 05/15/2011  . Acute blood loss anemia 05/15/2011    post  surgical  . Glaucoma 01/04/2013  . Hx of transient ischemic attack (TIA) 01/04/2013  . Macular degeneration   . HTN (hypertension) 03/30/2013  . Skin cancer   . Diabetes type 2, controlled   . Hyperlipidemia     Past Surgical History  Procedure Laterality Date  . Replacement total knee    . Thyroidectomy    . Appendectomy    . Tonsillectomy and adenoidectomy    . Inguinal hernia repair    . US echocardiography  11/10/2006    EF 55-60%  . US echocardiography  09/20/2002    EF 65-70%  . Other surgical history      microwave procedure for prostate - 09/12   . Total knee arthroplasty  05/11/2011    Procedure: TOTAL KNEE ARTHROPLASTY;  Surgeon: Gearlean Alf;  Location: [facility];  Service: Orthopedics;  Laterality: Right;  . Prostate ablation      microwave     Current Outpatient Prescriptions  Medication Sig Dispense Refill  . ALPRAZolam (XANAX) 0.25 MG tablet Take 1 tablet (0.25 mg total) by mouth 2 (two) times daily. 180 tablet 1  . amLODipine (NORVASC) 5 MG tablet Take 1 tablet (5 mg total) by mouth every morning. 90 tablet 3  . aspirin 81 MG tablet Take 81  mg by mouth daily.    . Bevacizumab (AVASTIN IV) Place into the right eye as directed. Every 6 weeks    . clopidogrel (PLAVIX) 75 MG tablet Take 1 tablet (75 mg total) by mouth daily. 90 tablet 3  . digoxin (LANOXIN) 0.125 MG tablet Take 0.125 mg by mouth daily.    . famotidine (PEPCID) 20 MG tablet Take 1 tablet (20 mg total) by mouth 2 (two) times daily. 180 tablet 3  . glucose blood (ONE TOUCH ULTRA TEST) test strip Use as directed once daily to check blood sugar.  Diagnosis code E11.42 100 each 11  . latanoprost (XALATAN) 0.005 % ophthalmic solution Place 1 drop into both eyes at bedtime.     Marland Kitchen levothyroxine (SYNTHROID, LEVOTHROID) 100 MCG tablet 1 tablet on Sunday, Tuesday, Thursday, and Saturday.  1 and 1/2 tablets on Monday, Wednesday, and Friday. (Patient taking differently: Take by mouth. 1 tablet on Sunday, Tuesday,  Thursday, and Saturday.  1 and 1/2 tablets on Monday, Wednesday, and Friday.) 120 tablet 3  . liothyronine (CYTOMEL) 25 MCG tablet Take 1 tablet (25 mcg total) by mouth every morning. 1 tablet 6 days a week. Pt does not take on sunday 90 tablet 3  . metFORMIN (GLUCOPHAGE) 1000 MG tablet Take 1,000 mg by mouth daily.    . metoprolol succinate (TOPROL-XL) 25 MG 24 hr tablet Take 1 tablet (25 mg total) by mouth every morning. 90 tablet 3  . multivitamin-lutein (OCUVITE-LUTEIN) CAPS Take 1 capsule by mouth daily.    . sildenafil (REVATIO) 20 MG tablet Take 2-5 tablets by mouth once daily as needed. 50 tablet 11  . zolpidem (AMBIEN) 5 MG tablet Take 1 tablet (5 mg total) by mouth at bedtime as needed for sleep. 90 tablet 1  . citalopram (CELEXA) 10 MG tablet Take 1 tablet (10 mg total) by mouth daily. (Patient not taking: Reported on 12/24/2014) 90 tablet 3  . [DISCONTINUED] warfarin (COUMADIN) 3 MG tablet Take 1 tablet (3 mg total) by mouth one time only at 6 PM. 35 tablet 0   No current facility-administered medications for this visit.    Allergies:   Lipitor and Zetia    Social History:  The patient  reports that he has never smoked. He has never used smokeless tobacco. He reports that he drinks about 4.2 oz of alcohol per week. He reports that he does not use illicit drugs.   Family History:  The patient's family history includes Arthritis in his mother; Diabetes in his brother and mother; Hypertension in his brother, father, and mother; Kidney disease in his father; Stroke in his father.    ROS:  Please see the history of present illness.   Otherwise, review of systems are positive for none.   All other systems are reviewed and negative.    PHYSICAL EXAM: VS:  BP 128/60 mmHg  Pulse 57  Ht 6\' 1"  (1.854 m)  Wt 174 lb 12.8 oz (79.289 kg)  BMI 23.07 kg/m2  SpO2 90% , BMI Body mass index is 23.07 kg/(m^2). GEN: Well nourished, well developed, in no acute distress HEENT: normal Neck: no  JVD, carotid bruits, or masses Cardiac: RRR; no murmurs, rubs, or gallops,no edema  Respiratory:  clear to auscultation bilaterally, normal work of breathing GI: soft, nontender, nondistended, + BS MS: no deformity or atrophy Skin: warm and dry, no rash.  Skin eruptions on scalp secondary to recently treated skin cancer therapy Neuro:  Strength and sensation are intact Psych: euthymic mood,  full affect   EKG:  EKG is not ordered today.    Recent Labs: 08/14/2014: TSH 0.06* 12/19/2014: ALT 17; BUN 20; Creatinine, Ser 1.17; Potassium 4.3; Sodium 139    Lipid Panel    Component Value Date/Time   CHOL 163 12/19/2014 0736   TRIG 53.0 12/19/2014 0736   HDL 54.20 12/19/2014 0736   CHOLHDL 3 12/19/2014 0736   VLDL 10.6 12/19/2014 0736   LDLCALC 98 12/19/2014 0736      Wt Readings from Last 3 Encounters:  12/24/14 174 lb 12.8 oz (79.289 kg)  11/20/14 175 lb 0.6 oz (79.398 kg)  10/10/14 172 lb 0.6 oz (78.037 kg)         ASSESSMENT AND PLAN:  1. Essential hypertension without heart failure 2. Diabetes mellitus type 2 with diabetic peripheral neuropathy. 3. Visual impairment secondary to macular degeneration. 4. Hypothyroidism 5. BPH 6. Osteoarthritis 7. Past history of TIA 8.sciatica with left leg radiation    Current medicines are reviewed at length with the patient today.  The patient does not have concerns regarding medicines.  The following changes have been made:  no change  Labs/ tests ordered today include:   Orders Placed This Encounter  Procedures  . Lipid panel  . Hepatic function panel  . Basic metabolic panel  . TSH  . T4, free  . Hemoglobin A1c    Disposition: Continue the medication.  Recheck in 4 months for office visit lipid panel hepatic function panel nasal metabolic panel O2U free T4 and TSH  Signed, Darlin Coco MD 12/24/2014 5:46 PM    [facility] [address], [place], [place]  [address] Phone: [phone]; Fax: [phone]

## 2014-12-24 NOTE — Patient Instructions (Signed)
Medication Instructions:  Your physician recommends that you continue on your current medications as directed. Please refer to the Current Medication list given to you today.  Labwork: NONE  Testing/Procedures: NONE  Follow-Up: Your physician wants you to follow-up in: 6 months with fasting labs (lp/bmet/hfp/TSH/FT4/A1C)  You will receive a reminder letter in the mail two months in advance. If you Anubis't receive a letter, please call our office to schedule the follow-up appointment.  Any Other Special Instructions Will Be Listed Below (If Applicable).

## 2015-01-11 DIAGNOSIS — H3532 Exudative age-related macular degeneration: Secondary | ICD-10-CM | POA: Diagnosis not present

## 2015-01-15 DIAGNOSIS — Z85828 Personal history of other malignant neoplasm of skin: Secondary | ICD-10-CM | POA: Diagnosis not present

## 2015-01-15 DIAGNOSIS — Z08 Encounter for follow-up examination after completed treatment for malignant neoplasm: Secondary | ICD-10-CM | POA: Diagnosis not present

## 2015-01-18 DIAGNOSIS — X32XXXD Exposure to sunlight, subsequent encounter: Secondary | ICD-10-CM | POA: Diagnosis not present

## 2015-01-18 DIAGNOSIS — L570 Actinic keratosis: Secondary | ICD-10-CM | POA: Diagnosis not present

## 2015-01-25 DIAGNOSIS — L570 Actinic keratosis: Secondary | ICD-10-CM | POA: Diagnosis not present

## 2015-01-25 DIAGNOSIS — X32XXXD Exposure to sunlight, subsequent encounter: Secondary | ICD-10-CM | POA: Diagnosis not present

## 2015-02-13 DIAGNOSIS — X32XXXD Exposure to sunlight, subsequent encounter: Secondary | ICD-10-CM | POA: Diagnosis not present

## 2015-02-13 DIAGNOSIS — L570 Actinic keratosis: Secondary | ICD-10-CM | POA: Diagnosis not present

## 2015-02-13 DIAGNOSIS — Z1283 Encounter for screening for malignant neoplasm of skin: Secondary | ICD-10-CM | POA: Diagnosis not present

## 2015-02-22 DIAGNOSIS — H3532 Exudative age-related macular degeneration: Secondary | ICD-10-CM | POA: Diagnosis not present

## 2015-02-27 DIAGNOSIS — H52203 Unspecified astigmatism, bilateral: Secondary | ICD-10-CM | POA: Diagnosis not present

## 2015-02-27 DIAGNOSIS — E119 Type 2 diabetes mellitus without complications: Secondary | ICD-10-CM | POA: Diagnosis not present

## 2015-02-27 DIAGNOSIS — H2512 Age-related nuclear cataract, left eye: Secondary | ICD-10-CM | POA: Diagnosis not present

## 2015-02-27 LAB — HM DIABETES EYE EXAM

## 2015-03-14 ENCOUNTER — Ambulatory Visit (INDEPENDENT_AMBULATORY_CARE_PROVIDER_SITE_OTHER): Payer: Medicare Other | Admitting: Podiatry

## 2015-03-14 DIAGNOSIS — E1142 Type 2 diabetes mellitus with diabetic polyneuropathy: Secondary | ICD-10-CM | POA: Diagnosis not present

## 2015-03-14 DIAGNOSIS — B351 Tinea unguium: Secondary | ICD-10-CM | POA: Diagnosis not present

## 2015-03-14 DIAGNOSIS — M79676 Pain in unspecified toe(s): Secondary | ICD-10-CM | POA: Diagnosis not present

## 2015-03-14 DIAGNOSIS — G629 Polyneuropathy, unspecified: Secondary | ICD-10-CM

## 2015-03-14 DIAGNOSIS — E1140 Type 2 diabetes mellitus with diabetic neuropathy, unspecified: Secondary | ICD-10-CM

## 2015-03-14 NOTE — Progress Notes (Signed)
Patient ID: Stephen Wilkinson, male   DOB: [birth year], 81 y.o.   MRN: [id number] Complaint:  Visit Type: Patient returns to my office for continued preventative foot care services. Complaint: Patient states" my nails have grown long and thick and become painful to walk and wear shoes" Patient has been diagnosed with DM with no foot complications. The patient presents for preventative foot care services. No changes to ROS.  He has macular degeneration with history of mother and grandmother losing their legs to diabetes.  Podiatric Exam: Vascular: dorsalis pedis and posterior tibial pulses are palpable bilateral. Capillary return is immediate. Temperature gradient is WNL. Skin turgor WNL  Sensorium: Normal Semmes Weinstein monofilament test. Normal tactile sensation bilaterally. Nail Exam: Pt has thick disfigured discolored nails with subungual debris noted bilateral entire nail hallux through fifth toenails Ulcer Exam: There is no evidence of ulcer or pre-ulcerative changes or infection. Orthopedic Exam: Muscle tone and strength are WNL. No limitations in general ROM. No crepitus or effusions noted. Foot type and digits show no abnormalities. Bony prominences are unremarkable. Skin: No Porokeratosis. No infection or ulcers.  Asymptomatic callus sub1,5 B/L  Diagnosis:  Onychomycosis, , Pain in right toe, pain in left toes  Treatment & Plan Procedures and Treatment: Consent by patient was obtained for treatment procedures. The patient understood the discussion of treatment and procedures well. All questions were answered thoroughly reviewed. Debridement of mycotic and hypertrophic toenails, 1 through 5 bilateral and clearing of subungual debris. No ulceration, no infection noted.  Return Visit-Office Procedure: Patient instructed to return to the office for a follow up visit 3 months for continued evaluation and treatment.

## 2015-03-27 DIAGNOSIS — Z23 Encounter for immunization: Secondary | ICD-10-CM | POA: Diagnosis not present

## 2015-03-29 ENCOUNTER — Telehealth: Payer: Self-pay | Admitting: Cardiology

## 2015-03-29 NOTE — Telephone Encounter (Signed)
New Message       Pt calling stating that there is a disconnect and some confusion between Dr. Jenny Reichmann and Dr. Mare Ferrari and he needs to talk to [facility]. Please call back and advise.

## 2015-03-29 NOTE — Telephone Encounter (Signed)
Spoke with patient and advised to keep his foolo up ov in November

## 2015-04-02 ENCOUNTER — Encounter: Payer: Self-pay | Admitting: Cardiology

## 2015-04-03 DIAGNOSIS — H353211 Exudative age-related macular degeneration, right eye, with active choroidal neovascularization: Secondary | ICD-10-CM | POA: Diagnosis not present

## 2015-04-05 DIAGNOSIS — L929 Granulomatous disorder of the skin and subcutaneous tissue, unspecified: Secondary | ICD-10-CM | POA: Diagnosis not present

## 2015-04-09 DIAGNOSIS — X32XXXD Exposure to sunlight, subsequent encounter: Secondary | ICD-10-CM | POA: Diagnosis not present

## 2015-04-09 DIAGNOSIS — L570 Actinic keratosis: Secondary | ICD-10-CM | POA: Diagnosis not present

## 2015-04-09 DIAGNOSIS — L980 Pyogenic granuloma: Secondary | ICD-10-CM | POA: Diagnosis not present

## 2015-04-18 ENCOUNTER — Other Ambulatory Visit (INDEPENDENT_AMBULATORY_CARE_PROVIDER_SITE_OTHER): Payer: Medicare Other | Admitting: *Deleted

## 2015-04-18 DIAGNOSIS — E785 Hyperlipidemia, unspecified: Secondary | ICD-10-CM | POA: Diagnosis not present

## 2015-04-18 DIAGNOSIS — I119 Hypertensive heart disease without heart failure: Secondary | ICD-10-CM | POA: Diagnosis not present

## 2015-04-18 DIAGNOSIS — E1142 Type 2 diabetes mellitus with diabetic polyneuropathy: Secondary | ICD-10-CM | POA: Diagnosis not present

## 2015-04-18 LAB — LIPID PANEL
CHOL/HDL RATIO: 2.3 ratio (ref <=5.0)
Cholesterol: 149 mg/dL (ref 125–200)
HDL: 64 mg/dL (ref >=40)
LDL CALC: 74 mg/dL (ref <130)
Triglycerides: 54 mg/dL (ref <150)
VLDL: 11 mg/dL (ref <30)

## 2015-04-18 LAB — BASIC METABOLIC PANEL
BUN: 15 mg/dL (ref 7–25)
CHLORIDE: 106 mmol/L (ref 98–110)
CO2: 24 mmol/L (ref 20–31)
CREATININE: 1.25 mg/dL — AB (ref 0.70–1.11)
Calcium: 9.1 mg/dL (ref 8.6–10.3)
GLUCOSE: 133 mg/dL — AB (ref 65–99)
Potassium: 4.3 mmol/L (ref 3.5–5.3)
Sodium: 139 mmol/L (ref 135–146)

## 2015-04-18 LAB — HEPATIC FUNCTION PANEL
ALT: 16 U/L (ref 9–46)
AST: 19 U/L (ref 10–35)
Albumin: 4.2 g/dL (ref 3.6–5.1)
Alkaline Phosphatase: 73 U/L (ref 40–115)
BILIRUBIN TOTAL: 0.6 mg/dL (ref 0.2–1.2)
Bilirubin, Direct: 0.1 mg/dL (ref <=0.2)
Indirect Bilirubin: 0.5 mg/dL (ref 0.2–1.2)
Total Protein: 6.7 g/dL (ref 6.1–8.1)

## 2015-04-18 LAB — T4, FREE: FREE T4: 0.99 ng/dL (ref 0.80–1.80)

## 2015-04-18 LAB — HEMOGLOBIN A1C
HEMOGLOBIN A1C: 6.6 % — AB (ref <5.7)
MEAN PLASMA GLUCOSE: 143 mg/dL — AB (ref <117)

## 2015-04-18 LAB — TSH

## 2015-04-18 NOTE — Addendum Note (Signed)
Addended by: Eulis Foster on: 04/18/2015 08:40 AM   Modules accepted: Orders

## 2015-04-19 NOTE — Progress Notes (Signed)
Quick Note:  Please make copy of labs for patient visit. ______ 

## 2015-04-23 DIAGNOSIS — L570 Actinic keratosis: Secondary | ICD-10-CM | POA: Diagnosis not present

## 2015-04-23 DIAGNOSIS — X32XXXD Exposure to sunlight, subsequent encounter: Secondary | ICD-10-CM | POA: Diagnosis not present

## 2015-04-25 ENCOUNTER — Encounter: Payer: Self-pay | Admitting: Cardiology

## 2015-04-25 ENCOUNTER — Ambulatory Visit (INDEPENDENT_AMBULATORY_CARE_PROVIDER_SITE_OTHER): Payer: Medicare Other | Admitting: Cardiology

## 2015-04-25 VITALS — BP 144/68 | HR 64 | Ht 73.0 in | Wt 176.8 lb

## 2015-04-25 DIAGNOSIS — F419 Anxiety disorder, unspecified: Secondary | ICD-10-CM

## 2015-04-25 DIAGNOSIS — E034 Atrophy of thyroid (acquired): Secondary | ICD-10-CM

## 2015-04-25 DIAGNOSIS — I119 Hypertensive heart disease without heart failure: Secondary | ICD-10-CM | POA: Diagnosis not present

## 2015-04-25 DIAGNOSIS — G4700 Insomnia, unspecified: Secondary | ICD-10-CM

## 2015-04-25 DIAGNOSIS — E038 Other specified hypothyroidism: Secondary | ICD-10-CM

## 2015-04-25 MED ORDER — LIOTHYRONINE SODIUM 25 MCG PO TABS: 25.0000 ug | ORAL_TABLET | ORAL | Status: DC

## 2015-04-25 MED ORDER — METOPROLOL SUCCINATE ER 25 MG PO TB24: 25.0000 mg | ORAL_TABLET | ORAL | Status: DC

## 2015-04-25 MED ORDER — FAMOTIDINE 20 MG PO TABS: 20.0000 mg | ORAL_TABLET | Freq: Two times a day (BID) | ORAL | Status: DC

## 2015-04-25 MED ORDER — AMLODIPINE BESYLATE 5 MG PO TABS: 5.0000 mg | ORAL_TABLET | ORAL | Status: DC

## 2015-04-25 MED ORDER — DIGOXIN 125 MCG PO TABS: 0.1250 mg | ORAL_TABLET | Freq: Every day | ORAL | Status: DC

## 2015-04-25 MED ORDER — ALPRAZOLAM 0.25 MG PO TABS: 0.2500 mg | ORAL_TABLET | Freq: Two times a day (BID) | ORAL | Status: DC

## 2015-04-25 MED ORDER — CLOPIDOGREL BISULFATE 75 MG PO TABS
75.0000 mg | ORAL_TABLET | Freq: Every day | ORAL | Status: DC
Start: 2015-04-25 — End: 2015-11-05

## 2015-04-25 MED ORDER — ZOLPIDEM TARTRATE 5 MG PO TABS: 5.0000 mg | ORAL_TABLET | Freq: Every evening | ORAL | Status: DC | PRN

## 2015-04-25 MED ORDER — LEVOTHYROXINE SODIUM 100 MCG PO TABS: ORAL_TABLET | ORAL | Status: DC

## 2015-04-25 NOTE — Progress Notes (Signed)
Cardiology Office Note   Date:  04/25/2015   ID:  Stephen Wilkinson, DOB November 27, 1928, MRN [id number]  PCP:  Cathlean Cower, MD  Cardiologist: Darlin Coco MD  Chief Complaint  Patient presents with  . Hypertension      History of Present Illness:  Stephen Wilkinson is a 81 y.o. male who presents for scheduled four-month follow-up office visit.  This pleasant 81 year old gentleman is seen for a scheduled followup office visit. He has a history of diabetes and high blood pressure. He does not have any history of ischemic heart disease. He has had a past history of palpitations. He has hypothyroidism, BPH and osteoarthritis. He has had both of his knees replaced. Since his last visit he has had no new cardiac symptoms. He and his wife have been reconciled in her marriage and this has taken a lot of stress off of him. He has not been having any chest pain. He continues to have a lot of problems with his eyesight. He has macular degeneration.  He is blind in his left eye and his right eye vision is decreasing rapidly.  He is discouraged about his prospects Since last visit the patient has had a lot of skin cancers treated on his scalp by his dermatologist Dr. Nevada Crane. Patient is diabetic. His hemoglobin A1c has improved since last visit. The patient denies any chest pain or shortness of breath. He has been walking for exercise. He has been sleeping better since he has been getting more exercise. The patient has not been experiencing any chest pain.  He has an abnormal EKG but has had previously normal Myoview stress test in 2009 and 2011.  EKGs have shown poor R-wave progression V1 through V3 but Myoview's have not shown any evidence of prior myocardial infarction.  Past Medical History  Diagnosis Date  . Hypertension   . Hyperlipidemia   . Diabetes mellitus   . Hypothyroidism   . Macular degeneration   . OA (osteoarthritis)   . Anxiety   . Depression   . PVC's (premature ventricular  contractions)   . Hx: UTI (urinary tract infection)   . Insomnia   . Stroke ([place])     mid 29s   . Pneumonia   . Chronic kidney disease     hx of uti due to catheter hx of prostatitis, BPH -   . Cancer ([place])     basal and squamous skin carcinoma   . Cataract   . Glaucoma   . Blood transfusion without reported diagnosis   . Altered mental status 05/15/2011  . Hypokalemia 05/15/2011  . Acute blood loss anemia 05/15/2011    post surgical  . Glaucoma 01/04/2013  . Hx of transient ischemic attack (TIA) 01/04/2013  . Macular degeneration   . HTN (hypertension) 03/30/2013  . Skin cancer   . Diabetes type 2, controlled ([place])   . Hyperlipidemia     Past Surgical History  Procedure Laterality Date  . Replacement total knee    . Thyroidectomy    . Appendectomy    . Tonsillectomy and adenoidectomy    . Inguinal hernia repair    . US echocardiography  11/10/2006    EF 55-60%  . US echocardiography  09/20/2002    EF 65-70%  . Other surgical history      microwave procedure for prostate - 09/12   . Total knee arthroplasty  05/11/2011    Procedure: TOTAL KNEE ARTHROPLASTY;  Surgeon: Gearlean Alf;  Location: [facility];  Service: Orthopedics;  Laterality: Right;  . Prostate ablation      microwave     Current Outpatient Prescriptions  Medication Sig Dispense Refill  . ALPRAZolam (XANAX) 0.25 MG tablet Take 1 tablet (0.25 mg total) by mouth 2 (two) times daily. 180 tablet 1  . amLODipine (NORVASC) 5 MG tablet Take 1 tablet (5 mg total) by mouth every morning. 90 tablet 3  . aspirin 81 MG tablet Take 81 mg by mouth daily.    . Bevacizumab (AVASTIN IV) Place into the right eye as directed. Every 6 weeks    . clobetasol cream (TEMOVATE) AB-123456789 % Apply 1 application topically daily as needed. Contact dermatitis  3  . clopidogrel (PLAVIX) 75 MG tablet Take 1 tablet (75 mg total) by mouth daily. 90 tablet 3  . digoxin (LANOXIN) 0.125 MG tablet Take 1 tablet (0.125 mg total) by mouth daily. 90  tablet 3  . famotidine (PEPCID) 20 MG tablet Take 1 tablet (20 mg total) by mouth 2 (two) times daily. 180 tablet 3  . glucose blood (ONE TOUCH ULTRA TEST) test strip Use as directed once daily to check blood sugar.  Diagnosis code E11.42 100 each 11  . latanoprost (XALATAN) 0.005 % ophthalmic solution Place 1 drop into both eyes at bedtime.     Marland Kitchen levothyroxine (SYNTHROID, LEVOTHROID) 100 MCG tablet Take 167mcg by mouth on Sunday, Tuesday, Thursday, and Saturday. Take 1.5tablets on Monday Wednesday, and Friday 115 tablet 3  . liothyronine (CYTOMEL) 25 MCG tablet Take 1 tablet (25 mcg total) by mouth every morning. 1 tablet 6 days a week. Pt does not take on sunday 90 tablet 3  . metFORMIN (GLUCOPHAGE) 1000 MG tablet Take 1,000 mg by mouth daily.    . metoprolol succinate (TOPROL-XL) 25 MG 24 hr tablet Take 1 tablet (25 mg total) by mouth every morning. 90 tablet 3  . multivitamin-lutein (OCUVITE-LUTEIN) CAPS Take 1 capsule by mouth daily.    . sildenafil (REVATIO) 20 MG tablet Take 2-5 tablets by mouth once daily as needed. 50 tablet 11  . zolpidem (AMBIEN) 5 MG tablet Take 1 tablet (5 mg total) by mouth at bedtime as needed for sleep. 90 tablet 1  . [DISCONTINUED] warfarin (COUMADIN) 3 MG tablet Take 1 tablet (3 mg total) by mouth one time only at 6 PM. 35 tablet 0   No current facility-administered medications for this visit.    Allergies:   Lipitor and Zetia    Social History:  The patient  reports that he has never smoked. He has never used smokeless tobacco. He reports that he drinks about 4.2 oz of alcohol per week. He reports that he does not use illicit drugs.   Family History:  The patient's family history includes Arthritis in his mother; Diabetes in his brother and mother; Hypertension in his brother, father, and mother; Kidney disease in his father; Stroke in his father.    ROS:  Please see the history of present illness.   Otherwise, review of systems are positive for none.   All  other systems are reviewed and negative.    PHYSICAL EXAM: VS:  BP 144/68 mmHg  Pulse 64  Ht 6\' 1"  (1.854 m)  Wt 176 lb 12.8 oz (80.196 kg)  BMI 23.33 kg/m2 , BMI Body mass index is 23.33 kg/(m^2). GEN: Well nourished, well developed, in no acute distress HEENT: normal Neck: no JVD, carotid bruits, or masses Cardiac: RRR; no murmurs, rubs, or gallops,no edema  Respiratory:  clear  to auscultation bilaterally, normal work of breathing GI: soft, nontender, nondistended, + BS MS: no deformity or atrophy Skin: warm and dry, no rash Neuro:  Strength and sensation are intact Psych: euthymic mood, full affect   EKG:  EKG is not ordered today.    Recent Labs: 04/18/2015: ALT 16; BUN 15; Creat 1.25*; Potassium 4.3; Sodium 139; TSH <0.008*    Lipid Panel    Component Value Date/Time   CHOL 149 04/18/2015 0840   TRIG 54 04/18/2015 0840   HDL 64 04/18/2015 0840   CHOLHDL 2.3 04/18/2015 0840   VLDL 11 04/18/2015 0840   LDLCALC 74 04/18/2015 0840      Wt Readings from Last 3 Encounters:  04/25/15 176 lb 12.8 oz (80.196 kg)  12/24/14 174 lb 12.8 oz (79.289 kg)  11/20/14 175 lb 0.6 oz (79.398 kg)         ASSESSMENT AND PLAN:  1. Essential hypertension without heart failure 2. Diabetes mellitus type 2 with diabetic peripheral neuropathy. 3. Visual impairment secondary to macular degeneration. 4. Hypothyroidism 5. BPH 6. Osteoarthritis 7. Past history of TIA 8.sciatica with left leg radiation  9.  Abnormal EKG with poor R-wave progression V1 through V3.  However normal Myoview's in 2009 and 2011 showing no evidence of myocardial infarction or ischemia  Current medicines are reviewed at length with the patient today.  The patient does not have concerns regarding medicines.  The following changes have been made:  no change  Labs/ tests ordered today include:  No orders of the defined types were placed in this encounter.    Disposition: Continue current  medication.  Recheck in 4 months for office visit EKG lipid panel hepatic function panel and basal metabolic panel.  He will be obtaining a PCP who will follow his diabetes and his thyroid condition.   Berna Spare MD 04/25/2015 1:18 PM    [facility] [place], [place], [place]  [address] Phone: [phone]; Fax: [phone]

## 2015-04-25 NOTE — Patient Instructions (Addendum)
Medication Instructions:  Your physician recommends that you continue on your current medications as directed. Please refer to the Current Medication list given to you today.  Labwork: NONE  Testing/Procedures: NONE  Follow-Up: Your physician recommends that you schedule a follow-up appointment in: 4 months OV AND EKG WITH Tera Helper NP OR SCOTT W PA   Any Other Special Instructions Will Be Listed Below (If Applicable) YOUR PRIMARY CARE PHYSICIAN WILL NEED TO START WRITING FOR YOUR THYROID, DIABETIC, SLEEP, AND ANXIETY MEDICATIONS   If you need a refill on your cardiac medications before your next appointment, please call your pharmacy.

## 2015-05-03 DIAGNOSIS — L258 Unspecified contact dermatitis due to other agents: Secondary | ICD-10-CM | POA: Diagnosis not present

## 2015-05-03 DIAGNOSIS — L259 Unspecified contact dermatitis, unspecified cause: Secondary | ICD-10-CM | POA: Diagnosis not present

## 2015-05-14 DIAGNOSIS — X32XXXD Exposure to sunlight, subsequent encounter: Secondary | ICD-10-CM | POA: Diagnosis not present

## 2015-05-14 DIAGNOSIS — L570 Actinic keratosis: Secondary | ICD-10-CM | POA: Diagnosis not present

## 2015-05-15 DIAGNOSIS — H353211 Exudative age-related macular degeneration, right eye, with active choroidal neovascularization: Secondary | ICD-10-CM | POA: Diagnosis not present

## 2015-05-22 ENCOUNTER — Ambulatory Visit (INDEPENDENT_AMBULATORY_CARE_PROVIDER_SITE_OTHER): Payer: Medicare Other | Admitting: Internal Medicine

## 2015-05-22 VITALS — BP 120/68 | HR 61 | Temp 98.0°F | Ht 73.0 in | Wt 180.0 lb

## 2015-05-22 DIAGNOSIS — E785 Hyperlipidemia, unspecified: Secondary | ICD-10-CM

## 2015-05-22 DIAGNOSIS — I10 Essential (primary) hypertension: Secondary | ICD-10-CM

## 2015-05-22 DIAGNOSIS — E1142 Type 2 diabetes mellitus with diabetic polyneuropathy: Secondary | ICD-10-CM | POA: Diagnosis not present

## 2015-05-22 DIAGNOSIS — E034 Atrophy of thyroid (acquired): Secondary | ICD-10-CM

## 2015-05-22 DIAGNOSIS — E038 Other specified hypothyroidism: Secondary | ICD-10-CM

## 2015-05-22 MED ORDER — SILDENAFIL CITRATE 20 MG PO TABS: ORAL_TABLET | ORAL | Status: DC

## 2015-05-22 MED ORDER — METFORMIN HCL 1000 MG PO TABS: 1000.0000 mg | ORAL_TABLET | Freq: Every day | ORAL | Status: DC

## 2015-05-22 NOTE — Assessment & Plan Note (Signed)
stable overall by history and exam, recent data reviewed with pt, and pt to continue medical treatment as before,  to f/u any worsening symptoms or concerns BP Readings from Last 3 Encounters:  05/22/15 120/68  04/25/15 144/68  12/24/14 128/60

## 2015-05-22 NOTE — Assessment & Plan Note (Signed)
stable overall by history and exam, recent data reviewed with pt, and pt to continue medical treatment as before,  to f/u any worsening symptoms or concerns  

## 2015-05-22 NOTE — Assessment & Plan Note (Signed)
stable overall by history and exam, recent data reviewed with pt, and pt to continue medical treatment as before,  to f/u any worsening symptoms or concerns Lab Results  Component Value Date   LDLCALC 74 04/18/2015

## 2015-05-22 NOTE — Progress Notes (Signed)
Pre visit review using our clinic review tool, if applicable. No additional management support is needed unless otherwise documented below in the visit note. 

## 2015-05-22 NOTE — Patient Instructions (Signed)
Please continue all other medications as before, and refills have been done if requested.  Please have the pharmacy call with any other refills you may need.  Please continue your efforts at being more active, low cholesterol diet, and weight control.  You are otherwise up to date with prevention measures today.  Please keep your appointments with your specialists as you may have planned  Please return in 4 months, or sooner if needed 

## 2015-05-22 NOTE — Assessment & Plan Note (Signed)
stable overall by history and exam, recent data reviewed with pt, and pt to continue medical treatment as before,  to f/u any worsening symptoms or concerns Lab Results  Component Value Date   HGBA1C 6.6* 04/18/2015

## 2015-05-22 NOTE — Progress Notes (Signed)
Subjective:    Patient ID: Stephen Wilkinson, male    DOB: [birth year], 81 y.o.   MRN: [id number]  HPI  Here to f/u; overall doing ok,  Pt denies chest pain, increasing sob or doe, wheezing, orthopnea, PND, increased LE swelling, palpitations, dizziness or syncope.  Pt denies new neurological symptoms such as new headache, or facial or extremity weakness or numbness.  Pt denies polydipsia, polyuria, or low sugar episode.   Pt denies new neurological symptoms such as new headache, or facial or extremity weakness or numbness.   Pt states overall good compliance with meds, mostly trying to follow appropriate diet, with wt overall stable, sees cardiology on regular basis, to change soon next yr as Dr Mare Ferrari is retiring.  Has chronic low TSH bu tnormal Free T4 on replacement for many yrs, also on cytomel.   Asks for every 4 mo visits.  Denies worsening depressive symptoms, suicidal ideation, or panic; has ongoing anxiety.  "im an old worry wort".  Has had worsening right eye macular degeneration despite the monthly injections.  Had recent squamous cell ca off top of head with 4 procedures, still on plavix after hx of TIA's, plan to stay on permanent, no TIA since started plavix 1996. Sees Dr hall/derm.  Has ongoing ED, asks for revatio refill.    Past Medical History  Diagnosis Date  . Hypertension   . Hyperlipidemia   . Diabetes mellitus   . Hypothyroidism   . Macular degeneration   . OA (osteoarthritis)   . Anxiety   . Depression   . PVC's (premature ventricular contractions)   . Hx: UTI (urinary tract infection)   . Insomnia   . Stroke ([place])     mid 52s   . Pneumonia   . Chronic kidney disease     hx of uti due to catheter hx of prostatitis, BPH -   . Cancer ([place])     basal and squamous skin carcinoma   . Cataract   . Glaucoma   . Blood transfusion without reported diagnosis   . Altered mental status 05/15/2011  . Hypokalemia 05/15/2011  . Acute blood loss anemia 05/15/2011    post  surgical  . Glaucoma 01/04/2013  . Hx of transient ischemic attack (TIA) 01/04/2013  . Macular degeneration   . HTN (hypertension) 03/30/2013  . Skin cancer   . Diabetes type 2, controlled ([place])   . Hyperlipidemia    Past Surgical History  Procedure Laterality Date  . Replacement total knee    . Thyroidectomy    . Appendectomy    . Tonsillectomy and adenoidectomy    . Inguinal hernia repair    . US echocardiography  11/10/2006    EF 55-60%  . US echocardiography  09/20/2002    EF 65-70%  . Other surgical history      microwave procedure for prostate - 09/12   . Total knee arthroplasty  05/11/2011    Procedure: TOTAL KNEE ARTHROPLASTY;  Surgeon: Gearlean Alf;  Location: [facility];  Service: Orthopedics;  Laterality: Right;  . Prostate ablation      microwave    reports that he has never smoked. He has never used smokeless tobacco. He reports that he drinks about 4.2 oz of alcohol per week. He reports that he does not use illicit drugs. family history includes Arthritis in his mother; Diabetes in his brother and mother; Hypertension in his brother, father, and mother; Kidney disease in his father; Stroke in his father. Allergies  Allergen Reactions  . Lipitor [Atorvastatin Calcium] Other (See Comments)    Myalgia  . Zetia [Ezetimibe] Other (See Comments)    Myalgia   Current Outpatient Prescriptions on File Prior to Visit  Medication Sig Dispense Refill  . ALPRAZolam (XANAX) 0.25 MG tablet Take 1 tablet (0.25 mg total) by mouth 2 (two) times daily. 180 tablet 1  . amLODipine (NORVASC) 5 MG tablet Take 1 tablet (5 mg total) by mouth every morning. 90 tablet 3  . aspirin 81 MG tablet Take 81 mg by mouth daily.    . Bevacizumab (AVASTIN IV) Place into the right eye as directed. Every 6 weeks    . clobetasol cream (TEMOVATE) AB-123456789 % Apply 1 application topically daily as needed. Contact dermatitis  3  . clopidogrel (PLAVIX) 75 MG tablet Take 1 tablet (75 mg total) by mouth daily. 90  tablet 3  . digoxin (LANOXIN) 0.125 MG tablet Take 1 tablet (0.125 mg total) by mouth daily. 90 tablet 3  . famotidine (PEPCID) 20 MG tablet Take 1 tablet (20 mg total) by mouth 2 (two) times daily. 180 tablet 3  . glucose blood (ONE TOUCH ULTRA TEST) test strip Use as directed once daily to check blood sugar.  Diagnosis code E11.42 100 each 11  . latanoprost (XALATAN) 0.005 % ophthalmic solution Place 1 drop into both eyes at bedtime.     Marland Kitchen levothyroxine (SYNTHROID, LEVOTHROID) 100 MCG tablet Take 136mcg by mouth on Sunday, Tuesday, Thursday, and Saturday. Take 1.5tablets on Monday Wednesday, and Friday 115 tablet 3  . liothyronine (CYTOMEL) 25 MCG tablet Take 1 tablet (25 mcg total) by mouth every morning. 1 tablet 6 days a week. Pt does not take on sunday 90 tablet 3  . metoprolol succinate (TOPROL-XL) 25 MG 24 hr tablet Take 1 tablet (25 mg total) by mouth every morning. 90 tablet 3  . multivitamin-lutein (OCUVITE-LUTEIN) CAPS Take 1 capsule by mouth daily.    Marland Kitchen zolpidem (AMBIEN) 5 MG tablet Take 1 tablet (5 mg total) by mouth at bedtime as needed for sleep. 90 tablet 1  . [DISCONTINUED] warfarin (COUMADIN) 3 MG tablet Take 1 tablet (3 mg total) by mouth one time only at 6 PM. 35 tablet 0   No current facility-administered medications on file prior to visit.   Review of Systems  Constitutional: Negative for unusual diaphoresis or night sweats HENT: Negative for ringing in ear or discharge Eyes: Negative for double vision or worsening visual disturbance.  Respiratory: Negative for choking and stridor.   Gastrointestinal: Negative for vomiting or other signifcant bowel change Genitourinary: Negative for hematuria or change in urine volume.  Musculoskeletal: Negative for other MSK pain or swelling Skin: Negative for color change and worsening wound.  Neurological: Negative for tremors and numbness other than noted  Psychiatric/Behavioral: Negative for decreased concentration or agitation  other than above       Objective:   Physical Exam BP 120/68 mmHg  Pulse 61  Temp(Src) 98 F (36.7 C) (Oral)  Ht 6\' 1"  (1.854 m)  Wt 180 lb (81.647 kg)  BMI 23.75 kg/m2  SpO2 98% VS noted,  Constitutional: Pt appears in no significant distress HENT: Head: NCAT.  Right Ear: External ear normal.  Left Ear: External ear normal.  Eyes: . Pupils are equal, round, and reactive to light. Conjunctivae and EOM are normal Neck: Normal range of motion. Neck supple.  Cardiovascular: Normal rate and regular rhythm.   Pulmonary/Chest: Effort normal and breath sounds without rales or wheezing.  Abd:  Soft, NT, ND, + BS Neurological: Pt is alert. Not confused , motor grossly intact Skin: Skin is warm. No rash, no LE edema Psychiatric: Pt behavior is normal. No agitation. + nervous    Assessment & Plan:

## 2015-06-06 ENCOUNTER — Encounter: Payer: Self-pay | Admitting: Podiatry

## 2015-06-06 ENCOUNTER — Ambulatory Visit (INDEPENDENT_AMBULATORY_CARE_PROVIDER_SITE_OTHER): Payer: Medicare Other | Admitting: Podiatry

## 2015-06-06 DIAGNOSIS — Q828 Other specified congenital malformations of skin: Secondary | ICD-10-CM | POA: Diagnosis not present

## 2015-06-06 DIAGNOSIS — B351 Tinea unguium: Secondary | ICD-10-CM | POA: Diagnosis not present

## 2015-06-06 DIAGNOSIS — M79676 Pain in unspecified toe(s): Secondary | ICD-10-CM

## 2015-06-06 DIAGNOSIS — E1140 Type 2 diabetes mellitus with diabetic neuropathy, unspecified: Secondary | ICD-10-CM

## 2015-06-06 NOTE — Progress Notes (Signed)
Patient ID: Stephen Wilkinson, male   DOB: [birth year], 81 y.o.   MRN: [id number] Complaint:  Visit Type: Patient returns to my office for continued preventative foot care services. Complaint: Patient states" my nails have grown long and thick and become painful to walk and wear shoes" Patient has been diagnosed with DM with no foot complications. The patient presents for preventative foot care services. No changes to ROS.  He has macular degeneration with history of mother and grandmother losing their legs to diabetes.  Podiatric Exam: Vascular: dorsalis pedis and posterior tibial pulses are palpable bilateral. Capillary return is immediate. Temperature gradient is WNL. Skin turgor WNL  Sensorium: Normal Semmes Weinstein monofilament test. Normal tactile sensation bilaterally. Nail Exam: Pt has thick disfigured discolored nails with subungual debris noted bilateral entire nail hallux through fifth toenails Ulcer Exam: There is no evidence of ulcer or pre-ulcerative changes or infection. Orthopedic Exam: Muscle tone and strength are WNL. No limitations in general ROM. No crepitus or effusions noted. Foot type and digits show no abnormalities. Bony prominences are unremarkable. HAV B/L. Skin: No Porokeratosis. No infection or ulcers.  Asymptomatic callus sub1,5 B/L  Diagnosis:  Onychomycosis, , Pain in right toe, pain in left toes,  Callus sub 5th right foot  Treatment & Plan Procedures and Treatment: Consent by patient was obtained for treatment procedures. The patient understood the discussion of treatment and procedures well. All questions were answered thoroughly reviewed. Debridement of mycotic and hypertrophic toenails, 1 through 5 bilateral and clearing of subungual debris. No ulceration, no infection noted. Debride callus right foot. Return Visit-Office Procedure: Patient instructed to return to the office for a follow up visit 3 months for continued evaluation and treatment.  Gardiner Barefoot DPM

## 2015-06-26 DIAGNOSIS — H35372 Puckering of macula, left eye: Secondary | ICD-10-CM | POA: Diagnosis not present

## 2015-06-26 DIAGNOSIS — H35051 Retinal neovascularization, unspecified, right eye: Secondary | ICD-10-CM | POA: Diagnosis not present

## 2015-06-26 DIAGNOSIS — H353211 Exudative age-related macular degeneration, right eye, with active choroidal neovascularization: Secondary | ICD-10-CM | POA: Diagnosis not present

## 2015-06-26 DIAGNOSIS — H35352 Cystoid macular degeneration, left eye: Secondary | ICD-10-CM | POA: Diagnosis not present

## 2015-08-07 DIAGNOSIS — H353211 Exudative age-related macular degeneration, right eye, with active choroidal neovascularization: Secondary | ICD-10-CM | POA: Diagnosis not present

## 2015-08-07 DIAGNOSIS — H353133 Nonexudative age-related macular degeneration, bilateral, advanced atrophic without subfoveal involvement: Secondary | ICD-10-CM | POA: Diagnosis not present

## 2015-08-15 ENCOUNTER — Encounter: Payer: Self-pay | Admitting: Podiatry

## 2015-08-15 ENCOUNTER — Ambulatory Visit (INDEPENDENT_AMBULATORY_CARE_PROVIDER_SITE_OTHER): Payer: Medicare Other | Admitting: Podiatry

## 2015-08-15 DIAGNOSIS — Q828 Other specified congenital malformations of skin: Secondary | ICD-10-CM

## 2015-08-15 DIAGNOSIS — E1140 Type 2 diabetes mellitus with diabetic neuropathy, unspecified: Secondary | ICD-10-CM | POA: Diagnosis not present

## 2015-08-15 DIAGNOSIS — B351 Tinea unguium: Secondary | ICD-10-CM | POA: Diagnosis not present

## 2015-08-15 DIAGNOSIS — M79676 Pain in unspecified toe(s): Secondary | ICD-10-CM | POA: Diagnosis not present

## 2015-08-15 NOTE — Progress Notes (Signed)
Patient ID: Stephen Wilkinson, male   DOB: June 14, 1929, 82 y.o.   MRN: [id number] Complaint:  Visit Type: Patient returns to my office for continued preventative foot care services. Complaint: Patient states" my nails have grown long and thick and become painful to walk and wear shoes" Patient has been diagnosed with DM with no foot complications. The patient presents for preventative foot care services. No changes to ROS.  He has macular degeneration with history of mother and grandmother losing their legs to diabetes.  Podiatric Exam: Vascular: dorsalis pedis and posterior tibial pulses are palpable bilateral. Capillary return is immediate. Temperature gradient is WNL. Skin turgor WNL  Sensorium: Normal Semmes Weinstein monofilament test. Normal tactile sensation bilaterally. Nail Exam: Pt has thick disfigured discolored nails with subungual debris noted bilateral entire nail hallux through fifth toenails Ulcer Exam: There is no evidence of ulcer or pre-ulcerative changes or infection. Orthopedic Exam: Muscle tone and strength are WNL. No limitations in general ROM. No crepitus or effusions noted. Foot type and digits show no abnormalities. Bony prominences are unremarkable. HAV B/L. Skin: No Porokeratosis. No infection or ulcers.  Asymptomatic callus sub1,5 B/L  Diagnosis:  Onychomycosis, , Pain in right toe, pain in left toes,  Callus sub 5th right foot  Treatment & Plan Procedures and Treatment: Consent by patient was obtained for treatment procedures. The patient understood the discussion of treatment and procedures well. All questions were answered thoroughly reviewed. Debridement of mycotic and hypertrophic toenails, 1 through 5 bilateral and clearing of subungual debris. No ulceration, no infection noted. Debride callus right foot. Return Visit-Office Procedure: Patient instructed to return to the office for a follow up visit 10 weeks  for continued evaluation and treatment.  Gardiner Barefoot DPM

## 2015-08-19 ENCOUNTER — Ambulatory Visit (INDEPENDENT_AMBULATORY_CARE_PROVIDER_SITE_OTHER): Payer: Medicare Other

## 2015-08-19 VITALS — BP 130/80 | Ht 73.0 in | Wt 180.2 lb

## 2015-08-19 DIAGNOSIS — Z0000 Encounter for general adult medical examination without abnormal findings: Secondary | ICD-10-CM | POA: Diagnosis not present

## 2015-08-19 NOTE — Progress Notes (Addendum)
Subjective:   Stephen Wilkinson is a 82 y.o. male who presents for Medicare Annual/Subsequent preventive examination.  Review of Systems:   Cardiac Risk Factors include: advanced age (>79men, >103 women);diabetes mellitus;dyslipidemia;family history of premature cardiovascular disease;male gender;microalbuminuria HRA assessment completed during visit; Stephen Wilkinson The Patient was informed that this wellness visit is to identify risk and educate on how to reduce risk for increase disease through lifestyle changes.   ROS deferred to CPE exam with physician 4/7   Medical and family hx Mother HTN; OA; mother lost her leg to diabetes  Father had DM; HTN; Stroke; kidney disease Brother had HTN; DM   Medical issues  Glaucoma; medically managed  HTN; medical managed;  DM2; managed Metformin; A1c 6.6; checks bs am and pm AM; 120;s;  Neuropathy in DM  Dyslipidemia; 04/2015; HDL 64; LDL 74; cho 149; Trig 54;  Depression or altered mental status; see note below Strokes; mini strokes in the 90's and Dr. Tonia Brooms retired; will find another cardiologist Hyperthyroidism and took out 2/3; check q 4 months;    BMI: 23,7 Diet; states he has lost weight; was 200lbs; Retired 1993;  Breakfast; eat cereal; cup of coffee; water down orange or prune juice Noon: sandwich peanut butter and jelly Supper has a regular dinner;   Exercise; walks every day 47min;  TKR; no residual  Tired; more but stays active; sometimes has to much to do and has to cut back   SAFETY; lives with wife one level home; making plans to perhaps move; Hearing loss/ yes but does not want hearing aids Removal of clutter clearing paths through the home,  Bathroom safety; lower to the ground; will consider bathroom modifications  Community safety; yes Smoke detectors yes Firearms safety / keep in safe place if they exist  Driving accidents and seatbelt/ none Sun protection/ sometimes; has a lot of skin cancers Stressors / formerly  worked for [employer]   Medication review/ no issues ;   Fall assessment / no  Last one is 2 years ago  Gait assessment/not as stable as he once was; not as much energy  Mobilization and Functional losses in the last year; more fatigue Sleep patterns; doesn't sleep well; discussed and gave suggestions; States he worries a lot about politics or others;   Urinary or fecal incontinence reviewed/ up at hs Had prostate problem ; Dr. Gaynelle Arabian allevated/ no problem since ablation;    Counseling: Urine microalbumin to be drawn at next physical; Was slightly elevated and did discuss Foot exam; deferred To Dr. Jenny Reichmann Colonoscopy; opted out; aged out EKG: 04/2014  Hearing: 2000hz  in right; left ear 0; losing hearing but hearing is not an issue  Ophthalmology exam; 02/2015/ left eye went first  Right eye being treated with shots q 6 weeks since 2005; not working as well  Still drives; can't read as much;  [employer] impacts Reading;  Immunizations: Up to date Health advice or referrals   Current Care Team reviewed and updated      Objective:    Vitals: BP 130/80 mmHg  Ht 6\' 1"  (1.854 m)  Wt 180 lb 4 oz (81.761 kg)  BMI 23.79 kg/m2  Tobacco History  Smoking status  . Never Smoker   Smokeless tobacco  . Never Used     Counseling given: Yes   Past Medical History  Diagnosis Date  . Hypertension   . Hyperlipidemia   . Diabetes mellitus   . Hypothyroidism   . Macular degeneration   .  OA (osteoarthritis)   . Anxiety   . Depression   . PVC's (premature ventricular contractions)   . Hx: UTI (urinary tract infection)   . Insomnia   . Stroke ([place])     mid 37s   . Pneumonia   . Chronic kidney disease     hx of uti due to catheter hx of prostatitis, BPH -   . Cancer ([place])     basal and squamous skin carcinoma   . Cataract   . Glaucoma   . Blood transfusion without reported diagnosis   . Altered mental status 05/15/2011  . Hypokalemia 05/15/2011  . Acute blood loss  anemia 05/15/2011    post surgical  . Glaucoma 01/04/2013  . Hx of transient ischemic attack (TIA) 01/04/2013  . Macular degeneration   . HTN (hypertension) 03/30/2013  . Skin cancer   . Diabetes type 2, controlled ([place])   . Hyperlipidemia    Past Surgical History  Procedure Laterality Date  . Replacement total knee    . Thyroidectomy    . Appendectomy    . Tonsillectomy and adenoidectomy    . Inguinal hernia repair    . US echocardiography  11/10/2006    EF 55-60%  . US echocardiography  09/20/2002    EF 65-70%  . Other surgical history      microwave procedure for prostate - 09/12   . Total knee arthroplasty  05/11/2011    Procedure: TOTAL KNEE ARTHROPLASTY;  Surgeon: Gearlean Alf;  Location: [facility];  Service: Orthopedics;  Laterality: Right;  . Prostate ablation      microwave   Family History  Problem Relation Age of Onset  . Hypertension Mother   . Arthritis Mother   . Diabetes Mother   . Hypertension Father   . Stroke Father   . Kidney disease Father   . Hypertension Brother   . Diabetes Brother    History  Sexual Activity  . Sexual Activity: No    Outpatient Encounter Prescriptions as of 08/19/2015  Medication Sig  . ALPRAZolam (XANAX) 0.25 MG tablet Take 1 tablet (0.25 mg total) by mouth 2 (two) times daily.  Marland Kitchen amLODipine (NORVASC) 5 MG tablet Take 1 tablet (5 mg total) by mouth every morning.  Marland Kitchen aspirin 81 MG tablet Take 81 mg by mouth daily.  . Bevacizumab (AVASTIN IV) Place into the right eye as directed. Every 6 weeks  . clobetasol cream (TEMOVATE) [id number] % Apply 1 application topically daily as needed. Contact dermatitis  . clopidogrel (PLAVIX) 75 MG tablet Take 1 tablet (75 mg total) by mouth daily.  . digoxin (LANOXIN) 0.125 MG tablet Take 1 tablet (0.125 mg total) by mouth daily.  . famotidine (PEPCID) 20 MG tablet Take 1 tablet (20 mg total) by mouth 2 (two) times daily.  Marland Kitchen glucose blood (ONE TOUCH ULTRA TEST) test strip Use as directed once daily to  check blood sugar.  Diagnosis code E11.42  . latanoprost (XALATAN) 0.005 % ophthalmic solution Place 1 drop into both eyes at bedtime.   Marland Kitchen levothyroxine (SYNTHROID, LEVOTHROID) 100 MCG tablet Take 197mcg by mouth on Sunday, Tuesday, Thursday, and Saturday. Take 1.5tablets on Monday Wednesday, and Friday  . liothyronine (CYTOMEL) 25 MCG tablet Take 1 tablet (25 mcg total) by mouth every morning. 1 tablet 6 days a week. Pt does not take on sunday  . metFORMIN (GLUCOPHAGE) 1000 MG tablet Take 1 tablet (1,000 mg total) by mouth daily.  . metoprolol succinate (TOPROL-XL) 25 MG 24  hr tablet Take 1 tablet (25 mg total) by mouth every morning.  . multivitamin-lutein (OCUVITE-LUTEIN) CAPS Take 1 capsule by mouth daily.  . sildenafil (REVATIO) 20 MG tablet Take 2-5 tablets by mouth once daily as needed.  . zolpidem (AMBIEN) 5 MG tablet Take 1 tablet (5 mg total) by mouth at bedtime as needed for sleep.   No facility-administered encounter medications on file as of 08/19/2015.    Activities of Daily Living In your present state of health, do you have any difficulty performing the following activities: 08/19/2015  Hearing? Y  Vision? Y  Difficulty concentrating or making decisions? N  Walking or climbing stairs? N  Dressing or bathing? N  Doing errands, shopping? N  Preparing Food and eating ? N  Using the Toilet? N  In the past six months, have you accidently leaked urine? Y  Do you have problems with loss of bowel control? Y  Managing your Medications? N  Managing your Finances? N  Housekeeping or managing your Housekeeping? N    Patient Care Team: Biagio Borg, MD as PCP - General (Internal Medicine) Biagio Borg, MD as Consulting Physician (Internal Medicine)   Assessment:   Assessment   Patient presents for yearly preventative medicine examination. Medicare questionnaire screening were completed, i.e. Functional; fall risk; depression, memory loss and hearing.  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 No other issues; does deny depression overtly but mentions lack of sleep as well as fear of losing eye sight on the right; requested more infor and was referred to [facility] for Retinal specialist in which there are clinical trials for macular degeneration he may want to review. Also given information regarding [employer] which will come out and assist with independence for those with low vision if his vision does get worse. He has lost central vision in his left eye; The doctor has told him his right eye is getting worse even with treatment q 6 weeks;  All immunizations and health maintenance protocols were reviewed with the patient and are up to date  Education provided for laboratory screens;  Discussed increase in microalbumin and will check labs at next visit with Dr. Jenny Reichmann.   Medication reconciliation, past medical history, social history, problem list and allergies were reviewed in detail with the patient  Goals were established with regard to taking care of himself; trying to get more sleep; Very compliant patient and attempts to take very good care of himself  End of life planning was discussed and has been completed     Exercise Activities and Dietary recommendations Current Exercise Habits: Structured exercise class, Type of exercise: walking, Time (Minutes): 30, Frequency (Times/Week): 6, Weekly Exercise (Minutes/Week): 180, Intensity: Moderate  Goals    . patient     To stay active; stop worrying!       Fall Risk Fall Risk  08/19/2015 11/20/2014 10/18/2013 01/04/2013 01/04/2013  Falls in the past year? No No No No No  Risk for fall due to : - - - History of fall(s);Impaired vision -  Risk for fall due to (comments): - - - no vision in left eye; right eye has three major diseases  -   Depression Screen PHQ 2/9 Scores 08/19/2015 11/20/2014 10/18/2013 01/04/2013  PHQ  - 2 Score 0 0 2 1    Cognitive Testing MMSE - Mini Mental State Exam 08/19/2015  Not completed: (No  Data)   Some minor mistakes with math; can't rule out vision issues vs; memory; No other failures of independent living.   Immunization History  Administered Date(s) Administered  . Influenza, High Dose Seasonal PF 03/30/2013  . Influenza-Unspecified 04/15/2014, 03/27/2015  . Pneumococcal Conjugate-13 10/18/2013  . Pneumococcal Polysaccharide-23 06/15/2010  . Tdap 01/04/2013   Screening Tests Health Maintenance  Topic Date Due  . URINE MICROALBUMIN  01/04/2014  . FOOT EXAM  10/19/2014  . HEMOGLOBIN A1C  10/16/2015  . INFLUENZA VACCINE  01/14/2016  . OPHTHALMOLOGY EXAM  02/27/2016  . TETANUS/TDAP  01/05/2023  . ZOSTAVAX  Completed  . PNA vac Low Risk Adult  Completed      Plan:     Given the web site for the [employer] Ret[employer] Also given link to clinical trails from this site  During the course of the visit the patient was educated and counseled about the following appropriate screening and preventive services:   Vaccines to include Pneumoccal, Influenza, Hepatitis B, Td, Zostavax, HCV/ up to date  Electrocardiogram 04/2014  Cardiovascular Disease/ doing well; no chest pain; BP good; Weight is good  Colorectal cancer screening/ aged out  Diabetes screening / 6.6 a1c  Prostate Cancer Screening/ deferred  Glaucoma screening/ neg; macular degeneration  Nutrition counseling / good at present; BMI normal   Smoking cessation counseling/ never smoked   Patient Instructions (the written plan) was given to the patient.    [id number], RN  08/19/2015   Medical screening examination/treatment/procedure(s) were performed by non-physician practitioner and as supervising physician I was immediately available for consultation/collaboration. I agree with above. Cathlean Cower, MD

## 2015-08-19 NOTE — Patient Instructions (Addendum)
Stephen Wilkinson , Thank you for taking time to come for your Medicare Wellness Visit. I appreciate your ongoing commitment to your health goals. Please review the following plan we discussed and let me know if I can assist you in the future.   [email or web address]  [email or web address] Taken from the patient link at the [employer] [facility]    Division of Blind/ Refer: Division for the blind Phone: [place] in [place]; [phone] Toll Free: [phone] Fax: [phone] Email: [email or web address] Physical Address [address] [place], [place] [address]  Keep walking;  Will see Dr. Jenny Reichmann in April   These are the goals we discussed: Goals    . patient     To stay active; stop worrying!        This is a list of the screening recommended for you and due dates:  Health Maintenance  Topic Date Due  . Urine Protein Check  01/04/2014  . Complete foot exam   10/19/2014  . Hemoglobin A1C  10/16/2015  . Flu Shot  01/14/2016  . Eye exam for diabetics  02/27/2016  . Tetanus Vaccine  01/05/2023  . Shingles Vaccine  Completed  . Pneumonia vaccines  Completed     Fall Prevention in the Home  Falls can cause injuries. They can happen to people of all ages. There are many things you can do to make your home safe and to help prevent falls.  WHAT CAN I DO ON THE OUTSIDE OF MY HOME?  Regularly fix the edges of walkways and driveways and fix any cracks.  Remove anything that might make you trip as you walk through a door, such as a raised step or threshold.  Trim any bushes or trees on the path to your home.  Use bright outdoor lighting.  Clear any walking paths of anything that might make someone trip, such as rocks or tools.  Regularly check to see if handrails are loose or broken. Make sure that both sides of any steps  have handrails.  Any raised decks and porches should have guardrails on the edges.  Have any leaves, snow, or ice cleared regularly.  Use sand or salt on walking paths during winter.  Clean up any spills in your garage right away. This includes oil or grease spills. WHAT CAN I DO IN THE BATHROOM?   Use night lights.  Install grab bars by the toilet and in the tub and shower. Do not use towel bars as grab bars.  Use non-skid mats or decals in the tub or shower.  If you need to sit down in the shower, use a plastic, non-slip stool.  Keep the floor dry. Clean up any water that spills on the floor as soon as it happens.  Remove soap buildup in the tub or shower regularly.  Attach bath mats securely with double-sided non-slip rug tape.  Do not have throw rugs and other things on the floor that can make you trip. WHAT CAN I DO IN THE BEDROOM?  Use night lights.  Make sure that you have a light by your bed that is easy to reach.  Do not use any sheets or blankets that are too big for your bed. They should not hang down onto the floor.  Have a firm chair that has side arms. You can use this for support while you get dressed.  Do not have throw rugs and other things on the floor that can make you trip. WHAT CAN I  DO IN THE KITCHEN?  Clean up any spills right away.  Avoid walking on wet floors.  Keep items that you use a lot in easy-to-reach places.  If you need to reach something above you, use a strong step stool that has a grab bar.  Keep electrical cords out of the way.  Do not use floor polish or wax that makes floors slippery. If you must use wax, use non-skid floor wax.  Do not have throw rugs and other things on the floor that can make you trip. WHAT CAN I DO WITH MY STAIRS?  Do not leave any items on the stairs.  Make sure that there are handrails on both sides of the stairs and use them. Fix handrails that are broken or loose. Make sure that handrails are as  long as the stairways.  Check any carpeting to make sure that it is firmly attached to the stairs. Fix any carpet that is loose or worn.  Avoid having throw rugs at the top or bottom of the stairs. If you do have throw rugs, attach them to the floor with carpet tape.  Make sure that you have a light switch at the top of the stairs and the bottom of the stairs. If you do not have them, ask someone to add them for you. WHAT ELSE CAN I DO TO HELP PREVENT FALLS?  Wear shoes that:  Do not have high heels.  Have rubber bottoms.  Are comfortable and fit you well.  Are closed at the toe. Do not wear sandals.  If you use a stepladder:  Make sure that it is fully opened. Do not climb a closed stepladder.  Make sure that both sides of the stepladder are locked into place.  Ask someone to hold it for you, if possible.  Clearly mark and make sure that you can see:  Any grab bars or handrails.  First and last steps.  Where the edge of each step is.  Use tools that help you move around (mobility aids) if they are needed. These include:  Canes.  Walkers.  Scooters.  Crutches.  Turn on the lights when you go into a dark area. Replace any light bulbs as soon as they burn out.  Set up your furniture so you have a clear path. Avoid moving your furniture around.  If any of your floors are uneven, fix them.  If there are any pets around you, be aware of where they are.  Review your medicines with your doctor. Some medicines can make you feel dizzy. This can increase your chance of falling. Ask your doctor what other things that you can do to help prevent falls.   This information is not intended to replace advice given to you by your health care provider. Make sure you discuss any questions you have with your health care provider.   Document Released: 03/28/2009 Document Revised: 10/16/2014 Document Reviewed: 07/06/2014 Elsevier Interactive Patient Education 2016 [place] Maintenance, Male A healthy lifestyle and preventative care can promote health and wellness.  Maintain regular health, dental, and eye exams.  Eat a healthy diet. Foods like vegetables, fruits, whole grains, low-fat dairy products, and lean protein foods contain the nutrients you need and are low in calories. Decrease your intake of foods high in solid fats, added sugars, and salt. Get information about a proper diet from your health care provider, if necessary.  Regular physical exercise is one of the most important things you can  do for your health. Most adults should get at least 150 minutes of moderate-intensity exercise (any activity that increases your heart rate and causes you to sweat) each week. In addition, most adults need muscle-strengthening exercises on 2 or more days a week.   Maintain a healthy weight. The body mass index (BMI) is a screening tool to identify possible weight problems. It provides an estimate of body fat based on height and weight. Your health care provider can find your BMI and can help you achieve or maintain a healthy weight. For males 20 years and older:  A BMI below 18.5 is considered underweight.  A BMI of 18.5 to 24.9 is normal.  A BMI of 25 to 29.9 is considered overweight.  A BMI of 30 and above is considered obese.  Maintain normal blood lipids and cholesterol by exercising and minimizing your intake of saturated fat. Eat a balanced diet with plenty of fruits and vegetables. Blood tests for lipids and cholesterol should begin at age 26 and be repeated every 5 years. If your lipid or cholesterol levels are high, you are over age 62, or you are at high risk for heart disease, you may need your cholesterol levels checked more frequently.Ongoing high lipid and cholesterol levels should be treated with medicines if diet and exercise are not working.  If you smoke, find out from your health care provider how to quit. If you do not use tobacco,  do not start.  Lung cancer screening is recommended for adults aged 20-80 years who are at high risk for developing lung cancer because of a history of smoking. A yearly low-dose CT scan of the lungs is recommended for people who have at least a 30-pack-year history of smoking and are current smokers or have quit within the past 15 years. A pack year of smoking is smoking an average of 1 pack of cigarettes a day for 1 year (for example, a 30-pack-year history of smoking could mean smoking 1 pack a day for 30 years or 2 packs a day for 15 years). Yearly screening should continue until the smoker has stopped smoking for at least 15 years. Yearly screening should be stopped for people who develop a health problem that would prevent them from having lung cancer treatment.  If you choose to drink alcohol, do not have more than 2 drinks per day. One drink is considered to be 12 oz (360 mL) of beer, 5 oz (150 mL) of wine, or 1.5 oz (45 mL) of liquor.  Avoid the use of street drugs. Do not share needles with anyone. Ask for help if you need support or instructions about stopping the use of drugs.  High blood pressure causes heart disease and increases the risk of stroke. High blood pressure is more likely to develop in:  People who have blood pressure in the end of the normal range (100-139/85-89 mm Hg).  People who are overweight or obese.  People who are African American.  If you are 57-72 years of age, have your blood pressure checked every 3-5 years. If you are 34 years of age or older, have your blood pressure checked every year. You should have your blood pressure measured twice--once when you are at a hospital or clinic, and once when you are not at a hospital or clinic. Record the average of the two measurements. To check your blood pressure when you are not at a hospital or clinic, you can use:  An automated blood pressure machine at  a pharmacy.  A home blood pressure monitor.  If you are  46-17 years old, ask your health care provider if you should take aspirin to prevent heart disease.  Diabetes screening involves taking a blood sample to check your fasting blood sugar level. This should be done once every 3 years after age 56 if you are at a normal weight and without risk factors for diabetes. Testing should be considered at a younger age or be carried out more frequently if you are overweight and have at least 1 risk factor for diabetes.  Colorectal cancer can be detected and often prevented. Most routine colorectal cancer screening begins at the age of 26 and continues through age 58. However, your health care provider may recommend screening at an earlier age if you have risk factors for colon cancer. On a yearly basis, your health care provider may provide home test kits to check for hidden blood in the stool. A small camera at the end of a tube may be used to directly examine the colon (sigmoidoscopy or colonoscopy) to detect the earliest forms of colorectal cancer. Talk to your health care provider about this at age 55 when routine screening begins. A direct exam of the colon should be repeated every 5-10 years through age 61, unless early forms of precancerous polyps or small growths are found.  People who are at an increased risk for hepatitis B should be screened for this virus. You are considered at high risk for hepatitis B if:  You were born in a country where hepatitis B occurs often. Talk with your health care provider about which countries are considered high risk.  Your parents were born in a high-risk country and you have not received a shot to protect against hepatitis B (hepatitis B vaccine).  You have HIV or AIDS.  You use needles to inject street drugs.  You live with, or have sex with, someone who has hepatitis B.  You are a man who has sex with other men (MSM).  You get hemodialysis treatment.  You take certain medicines for conditions like cancer, organ  transplantation, and autoimmune conditions.  Hepatitis C blood testing is recommended for all people born from [birth year] through 1963 and any individual with known risk factors for hepatitis C.  Healthy men should no longer receive prostate-specific antigen (PSA) blood tests as part of routine cancer screening. Talk to your health care provider about prostate cancer screening.  Testicular cancer screening is not recommended for adolescents or adult males who have no symptoms. Screening includes self-exam, a health care provider exam, and other screening tests. Consult with your health care provider about any symptoms you have or any concerns you have about testicular cancer.  Practice safe sex. Use condoms and avoid high-risk sexual practices to reduce the spread of sexually transmitted infections (STIs).  You should be screened for STIs, including gonorrhea and chlamydia if:  You are sexually active and are younger than 24 years.  You are older than 24 years, and your health care provider tells you that you are at risk for this type of infection.  Your sexual activity has changed since you were last screened, and you are at an increased risk for chlamydia or gonorrhea. Ask your health care provider if you are at risk.  If you are at risk of being infected with HIV, it is recommended that you take a prescription medicine daily to prevent HIV infection. This is called pre-exposure prophylaxis (PrEP). You are considered at  risk if:  You are a man who has sex with other men (MSM).  You are a heterosexual man who is sexually active with multiple partners.  You take drugs by injection.  You are sexually active with a partner who has HIV.  Talk with your health care provider about whether you are at high risk of being infected with HIV. If you choose to begin PrEP, you should first be tested for HIV. You should then be tested every 3 months for as long as you are taking PrEP.  Use sunscreen. Apply  sunscreen liberally and repeatedly throughout the day. You should seek shade when your shadow is shorter than you. Protect yourself by wearing long sleeves, pants, a wide-brimmed hat, and sunglasses year round whenever you are outdoors.  Tell your health care provider of new moles or changes in moles, especially if there is a change in shape or color. Also, tell your health care provider if a mole is larger than the size of a pencil eraser.  A one-time screening for abdominal aortic aneurysm (AAA) and surgical repair of large AAAs by ultrasound is recommended for men aged 7-75 years who are current or former smokers.  Stay current with your vaccines (immunizations).   This information is not intended to replace advice given to you by your health care provider. Make sure you discuss any questions you have with your health care provider.   Document Released: 11/28/2007 Document Revised: 06/22/2014 Document Reviewed: 10/27/2010 Elsevier Interactive Patient Education [employer].

## 2015-08-22 NOTE — Progress Notes (Signed)
Cardiology Office Note:    Date:  08/23/2015   ID:  Stephen Wilkinson, DOB 1929/05/23, MRN [id number]  PCP:  Cathlean Cower, MD  Cardiologist:  Dr. Darlin Coco  >> Richardson Dopp, PA-C / Dr. Liam Rogers  Electrophysiologist:  n/a  Chief Complaint  Patient presents with  . Follow-up    Hypertension    History of Present Illness:     Stephen Wilkinson is a 82 y.o. male with a hx of DM2, HTN, prior TIA, palpitations, hypothyroidism, DJD, BPH, macular degeneration, anxiety. Last seen by Dr. Mare Ferrari 11/16. He returns for follow-up.   Returns for FU.  He is doing well. The patient denies chest pain, shortness of breath, syncope, orthopnea, PND or significant pedal edema. He does note some fatigue.  He is frustrated by his decreased eyesight from mac degen.    Past Medical History  Diagnosis Date  . Hypertension   . Hyperlipidemia   . Diabetes mellitus   . Hypothyroidism   . Macular degeneration   . OA (osteoarthritis)   . Anxiety   . Depression   . PVC's (premature ventricular contractions)   . Hx: UTI (urinary tract infection)   . Insomnia   . Stroke ([place])     mid 21s   . Pneumonia   . Chronic kidney disease     hx of uti due to catheter hx of prostatitis, BPH -   . Cancer ([place])     basal and squamous skin carcinoma   . Cataract   . Glaucoma   . Blood transfusion without reported diagnosis   . Altered mental status 05/15/2011  . Hypokalemia 05/15/2011  . Acute blood loss anemia 05/15/2011    post surgical  . Glaucoma 01/04/2013  . Hx of transient ischemic attack (TIA) 01/04/2013  . Macular degeneration   . HTN (hypertension) 03/30/2013  . Skin cancer   . Diabetes type 2, controlled ([place])   . Hyperlipidemia     Past Surgical History  Procedure Laterality Date  . Replacement total knee    . Thyroidectomy    . Appendectomy    . Tonsillectomy and adenoidectomy    . Inguinal hernia repair    . US echocardiography  11/10/2006    EF 55-60%  . US echocardiography  09/20/2002   EF 65-70%  . Other surgical history      microwave procedure for prostate - 09/12   . Total knee arthroplasty  05/11/2011    Procedure: TOTAL KNEE ARTHROPLASTY;  Surgeon: Gearlean Alf;  Location: [facility];  Service: Orthopedics;  Laterality: Right;  . Prostate ablation      microwave    Current Medications: Outpatient Prescriptions Prior to Visit  Medication Sig Dispense Refill  . ALPRAZolam (XANAX) 0.25 MG tablet Take 1 tablet (0.25 mg total) by mouth 2 (two) times daily. 180 tablet 1  . amLODipine (NORVASC) 5 MG tablet Take 1 tablet (5 mg total) by mouth every morning. 90 tablet 3  . aspirin 81 MG tablet Take 81 mg by mouth daily.    . Bevacizumab (AVASTIN IV) Place 1 drop into the right eye as directed. Every 6 weeks    . clobetasol cream (TEMOVATE) [id number] % Apply 1 application topically daily as needed (for contact dermatitis). Contact dermatitis  3  . clopidogrel (PLAVIX) 75 MG tablet Take 1 tablet (75 mg total) by mouth daily. 90 tablet 3  . digoxin (LANOXIN) 0.125 MG tablet Take 1 tablet (0.125 mg total) by mouth daily. 90 tablet 3  .  famotidine (PEPCID) 20 MG tablet Take 1 tablet (20 mg total) by mouth 2 (two) times daily. 180 tablet 3  . glucose blood (ONE TOUCH ULTRA TEST) test strip Use as directed once daily to check blood sugar.  Diagnosis code E11.42 100 each 11  . latanoprost (XALATAN) 0.005 % ophthalmic solution Place 1 drop into both eyes at bedtime.     Marland Kitchen levothyroxine (SYNTHROID, LEVOTHROID) 100 MCG tablet Take 138mcg by mouth on Sunday, Tuesday, Thursday, and Saturday. Take 1.5tablets on Monday Wednesday, and Friday 115 tablet 3  . liothyronine (CYTOMEL) 25 MCG tablet Take 1 tablet (25 mcg total) by mouth every morning. 1 tablet 6 days a week. Pt does not take on sunday 90 tablet 3  . metFORMIN (GLUCOPHAGE) 1000 MG tablet Take 1 tablet (1,000 mg total) by mouth daily. 90 tablet 3  . metoprolol succinate (TOPROL-XL) 25 MG 24 hr tablet Take 1 tablet (25 mg total) by mouth  every morning. 90 tablet 3  . multivitamin-lutein (OCUVITE-LUTEIN) CAPS Take 1 capsule by mouth daily.    Marland Kitchen zolpidem (AMBIEN) 5 MG tablet Take 1 tablet (5 mg total) by mouth at bedtime as needed for sleep. 90 tablet 1  . sildenafil (REVATIO) 20 MG tablet Take 2-5 tablets by mouth once daily as needed. (Patient taking differently: Take 2-5 tablets by mouth once daily as needed for erectile dysfunction.) 50 tablet 11   No facility-administered medications prior to visit.     Allergies:   Lipitor and Zetia   Social History   Social History  . Marital Status: Married    Spouse Name: N/A  . Number of Children: 3  . Years of Education: N/A   Occupational History  . 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 Smoking status: Never Smoker   . Smokeless tobacco: Never Used  . Alcohol Use: 4.2 oz/week    7 Shots of liquor per week     Comment: 2 drinks most days  . Drug Use: No  . Sexual Activity: No   Other Topics Concern  . 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   ROS:   Please see the history of present illness.    Review of Systems  Eyes: Positive for visual disturbance.  All other systems reviewed and are negative.   Physical Exam:    VS:  BP 150/62 mmHg  Pulse 58  Ht 6\' 1"  (1.854 m)  Wt 177 lb 12.8 oz (80.65 kg)  BMI 23.46 kg/m2   GEN: Well nourished, well developed, in no acute distress HEENT: normal Neck: no JVD, no masses Cardiac: Normal S1/S2, RRR; no murmurs, rubs, or gallops, no edema;  no carotid bruits,   Respiratory:  clear  to auscultation bilaterally; no wheezing, rhonchi or rales GI: soft, nontender, nondistended MS: no deformity or atrophy Skin: warm and dry Neuro: No focal deficits  Psych: Alert and oriented x 3, normal affect  Wt Readings  from Last 3 Encounters:  08/23/15 177 lb 12.8 oz (80.65 kg)  08/19/15 180 lb 4 oz (81.761 kg)  05/22/15 180 lb (81.647 kg)      Studies/Labs Reviewed:     EKG:  EKG is  ordered today.  The ekg ordered today demonstrates sinus brady, HR 57, LAD, septal Q waves, QTc 365 ms, NSSTTW changes, no change from prior tracing   Recent Labs: 04/18/2015: ALT 16; BUN 15; Creat 1.25*; Potassium 4.3; Sodium 139; TSH <0.008*   Recent Lipid Panel    Component Value Date/Time   CHOL 149 04/18/2015 0840   TRIG 54 04/18/2015 0840   HDL 64 04/18/2015 0840   CHOLHDL 2.3 04/18/2015 0840   VLDL 11 04/18/2015 0840   LDLCALC 74 04/18/2015 0840    Additional studies/ records that were reviewed today include:   Myoview 10/13 Normal, no ischemia, EF 68%  Echo 5/08 EF 55-60%, mild LVH, impaired relaxation, mild Ao Sclerosis, mild MR, mild TR   ASSESSMENT:     1. Benign hypertensive heart disease without heart failure   2. Hx of transient ischemic attack (TIA)   3. Other fatigue     PLAN:     In order of problems listed above:  1. HTN - BP is borderline elevated today. It is usually well controlled.  Continue to monitor.  Of note, I spent a considerable amount of time discussing the patient's plans for follow up.  He is requesting Q 4 month lab work. I reviewed his chart.  From a cardiology perspective, I see no reason why he should have Q 4 month lab work. He should have periodic checks on his A1c and TSH with primary care. Lipids can be checked Q 1 year.  Renal function should be checked with BMET 1-2 times a year.  He is somewhat frustrated with this as he was used to getting labs done more often with Dr. Mare Ferrari. I spent time with him today to help explain what tests he  needs and when it is appropriate to get blood work.   2. Prior TIA - Continue ASA, Plavix. He tells me he has been on Dig since his TIA in the 1990s. A doctor in [place] started this.  I am not clear why he is on Dig for this.  He denies a hx of AFib. Will allow him to continue for now as he does not want to stop. Arrange BMET, Dig level.   3. Fatigue - As noted, we had a long discussion about lab work and continued monitoring of his heart.  I reassured him that prior stress testing was normal in 2013 and an Echo in 2008 was also without significant abnormality.  He is concerned that routine blood work will miss something with his heart. We discussed how continued monitoring of symptoms is quite adequate in assessing for cardiovascular health.  Given his fatigue, I have suggested a plain ETT to screen for ischemic heart disease.  -  Arrange ETT.    Medication Adjustments/Labs and Tests Ordered: Current medicines are reviewed at length with the patient today.  Concerns regarding medicines are outlined above.  Medication changes, Labs and Tests ordered today are outlined in the Patient Instructions noted below. Patient Instructions  Medication Instructions:  1. Your physician recommends that you continue on your current medications as directed. Please refer to the Current Medication list given to you today.  Labwork: 1. TO BE DONE IN THE NEXT WEEK OR SO ; BMET AND DIGOXIN LEVEL ;  YOU WILL NEED TO HOLD THE DIGOXIN THE MORNING OF LAB WORK;  Testing/Procedures: 1. Your physician has requested that you have an exercise tolerance test. For further information please visit [email or web address]. Please also follow instruction sheet, as given.   Follow-Up: Your physician wants you to follow-up in: [place], [place].   You will receive a reminder letter in the mail two months in advance. If you [place]'t receive a letter, please call our office to schedule the follow-up appointment.   Any Other  Special Instructions Will Be Listed Below (If Applicable).  If you need a refill on your cardiac medications before your next appointment, please call your pharmacy.    Signed, Richardson Dopp, PA-C  08/23/2015 3:26 PM    [facility] [address], [place], [place]  [address] Phone: [phone]; Fax: [phone]

## 2015-08-23 ENCOUNTER — Ambulatory Visit (INDEPENDENT_AMBULATORY_CARE_PROVIDER_SITE_OTHER): Payer: Medicare Other | Admitting: Physician Assistant

## 2015-08-23 ENCOUNTER — Encounter: Payer: Self-pay | Admitting: Physician Assistant

## 2015-08-23 VITALS — BP 150/62 | HR 58 | Ht 73.0 in | Wt 177.8 lb

## 2015-08-23 DIAGNOSIS — Z8673 Personal history of transient ischemic attack (TIA), and cerebral infarction without residual deficits: Secondary | ICD-10-CM

## 2015-08-23 DIAGNOSIS — I119 Hypertensive heart disease without heart failure: Secondary | ICD-10-CM | POA: Diagnosis not present

## 2015-08-23 DIAGNOSIS — R5383 Other fatigue: Secondary | ICD-10-CM

## 2015-08-23 NOTE — Patient Instructions (Addendum)
Medication Instructions:  1. Your physician recommends that you continue on your current medications as directed. Please refer to the Current Medication list given to you today.  Labwork: 1. TO BE DONE IN THE NEXT WEEK OR SO ; BMET AND DIGOXIN LEVEL ; YOU WILL NEED TO HOLD THE DIGOXIN THE MORNING OF LAB WORK;  Testing/Procedures: 1. Your physician has requested that you have an exercise tolerance test. For further information please visit [facility][email or web address]. Please also follow instruction sheet, as given.   Follow-Up: Your physician wants you to follow-up in: [place], [place].   You will receive a reminder letter in the mail two months in advance. If you Eisen't receive a letter, please call our office to schedule the follow-up appointment.   Any Other Special Instructions Will Be Listed Below (If Applicable).  If you need a refill on your cardiac medications before your next appointment, please call your pharmacy.

## 2015-08-27 ENCOUNTER — Other Ambulatory Visit (INDEPENDENT_AMBULATORY_CARE_PROVIDER_SITE_OTHER): Payer: Medicare Other | Admitting: *Deleted

## 2015-08-27 ENCOUNTER — Ambulatory Visit (INDEPENDENT_AMBULATORY_CARE_PROVIDER_SITE_OTHER): Payer: Medicare Other

## 2015-08-27 ENCOUNTER — Other Ambulatory Visit: Payer: Self-pay

## 2015-08-27 DIAGNOSIS — R5383 Other fatigue: Secondary | ICD-10-CM

## 2015-08-27 DIAGNOSIS — R9439 Abnormal result of other cardiovascular function study: Secondary | ICD-10-CM

## 2015-08-27 DIAGNOSIS — Z8673 Personal history of transient ischemic attack (TIA), and cerebral infarction without residual deficits: Secondary | ICD-10-CM | POA: Diagnosis not present

## 2015-08-27 DIAGNOSIS — I119 Hypertensive heart disease without heart failure: Secondary | ICD-10-CM

## 2015-08-27 DIAGNOSIS — I440 Atrioventricular block, first degree: Secondary | ICD-10-CM

## 2015-08-27 LAB — EXERCISE TOLERANCE TEST
CHL CUP MPHR: 135 {beats}/min
CHL CUP STRESS STAGE 1 GRADE: 0 %
CHL CUP STRESS STAGE 1 HR: 80 {beats}/min
CHL CUP STRESS STAGE 2 GRADE: 0 %
CHL CUP STRESS STAGE 2 SPEED: 1 mph
CHL CUP STRESS STAGE 3 HR: 86 {beats}/min
CHL CUP STRESS STAGE 4 DBP: 92 mmHg
CHL CUP STRESS STAGE 4 SBP: 169 mmHg
CHL CUP STRESS STAGE 4 SPEED: 1.7 mph
CHL CUP STRESS STAGE 5 HR: 104 {beats}/min
CHL CUP STRESS STAGE 6 GRADE: 0 %
CHL CUP STRESS STAGE 6 HR: 58 {beats}/min
CHL CUP STRESS STAGE 6 SPEED: 0 mph
CSEPED: 3 min
CSEPEDS: 30 s
CSEPEW: 4.6 METS
CSEPHR: 88 %
Peak BP: 169 mmHg
Peak HR: 118 {beats}/min
Percent of predicted max HR: 87 %
RPE: 14
Rest HR: 71 {beats}/min
Stage 1 DBP: 84 mmHg
Stage 1 SBP: 156 mmHg
Stage 1 Speed: 0 mph
Stage 2 HR: 86 {beats}/min
Stage 3 Grade: 0.2 %
Stage 3 Speed: 1 mph
Stage 4 Grade: 10 %
Stage 4 HR: 118 {beats}/min
Stage 5 DBP: 92 mmHg
Stage 5 Grade: 0 %
Stage 5 SBP: 191 mmHg
Stage 5 Speed: 0 mph
Stage 6 DBP: 73 mmHg
Stage 6 SBP: 168 mmHg

## 2015-08-27 LAB — BASIC METABOLIC PANEL
BUN: 17 mg/dL (ref 7–25)
CALCIUM: 9.4 mg/dL (ref 8.6–10.3)
CO2: 28 mmol/L (ref 20–31)
Chloride: 104 mmol/L (ref 98–110)
Creat: 1.03 mg/dL (ref 0.70–1.11)
Glucose, Bld: 152 mg/dL — ABNORMAL HIGH (ref 65–99)
POTASSIUM: 4.3 mmol/L (ref 3.5–5.3)
Sodium: 140 mmol/L (ref 135–146)

## 2015-08-27 LAB — DIGOXIN LEVEL: Digoxin Level: 0.6 ug/L — ABNORMAL LOW (ref 0.8–2.0)

## 2015-08-27 NOTE — Addendum Note (Signed)
Addended by: Eulis Foster on: 08/27/2015 08:56 AM   Modules accepted: Orders

## 2015-08-27 NOTE — Addendum Note (Signed)
Addended by: Eulis Foster on: 08/27/2015 08:59 AM   Modules accepted: Orders

## 2015-08-27 NOTE — Addendum Note (Signed)
Addended by: Eulis Foster on: 08/27/2015 08:52 AM   Modules accepted: Orders

## 2015-08-29 ENCOUNTER — Telehealth: Payer: Self-pay | Admitting: *Deleted

## 2015-08-29 NOTE — Telephone Encounter (Signed)
Pt notified of lab results by phone. Pt asked me if I could tell him why he getting another stress test. I explained to pt some borderline EKG changes as well as he had told PA he had been fatigued. I explained want to make sure no bloackage. Pt thanked me for my time in explaining to him and he is greatfull for our help and service.

## 2015-08-29 NOTE — Telephone Encounter (Signed)
Lmptcb for lab results 

## 2015-09-02 DIAGNOSIS — H26491 Other secondary cataract, right eye: Secondary | ICD-10-CM | POA: Diagnosis not present

## 2015-09-02 DIAGNOSIS — H40053 Ocular hypertension, bilateral: Secondary | ICD-10-CM | POA: Diagnosis not present

## 2015-09-02 DIAGNOSIS — H2512 Age-related nuclear cataract, left eye: Secondary | ICD-10-CM | POA: Diagnosis not present

## 2015-09-03 ENCOUNTER — Telehealth: Payer: Self-pay | Admitting: Physician Assistant

## 2015-09-03 NOTE — Telephone Encounter (Signed)
New message  ° ° °Patient calling back to speak with nurse  °

## 2015-09-03 NOTE — Telephone Encounter (Signed)
I cb pt in regards to help answer his questions. Pt asked what did he need to hold for Lexiscan on 3/27. I advised hold metformin morning of test, no coffee after midnight day before test. NPO 2 hours before test. Pt agreeable to plan of care. Pt also asked for a copy of labs to pick up. I said I will place labs at front desk for when he comes in for [place].

## 2015-09-04 ENCOUNTER — Telehealth (HOSPITAL_COMMUNITY): Payer: Self-pay | Admitting: *Deleted

## 2015-09-04 NOTE — Telephone Encounter (Signed)
Patient's wife per DPR given detailed instructions per Myocardial Perfusion Study Information Sheet for the test on 09/09/15. Patient notified to arrive 15 minutes early and that it is imperative to arrive on time for appointment to keep from having the test rescheduled.  If you need to cancel or reschedule your appointment, please call the office within 24 hours of your appointment. Failure to do so may result in a cancellation of your appointment, and a $50 no show fee. Patient verbalized understanding.Hubbard Robinson, RN

## 2015-09-09 ENCOUNTER — Ambulatory Visit (HOSPITAL_COMMUNITY): Payer: Medicare Other | Attending: Cardiology

## 2015-09-09 DIAGNOSIS — I440 Atrioventricular block, first degree: Secondary | ICD-10-CM | POA: Diagnosis not present

## 2015-09-09 DIAGNOSIS — R9439 Abnormal result of other cardiovascular function study: Secondary | ICD-10-CM | POA: Diagnosis not present

## 2015-09-09 DIAGNOSIS — I119 Hypertensive heart disease without heart failure: Secondary | ICD-10-CM | POA: Insufficient documentation

## 2015-09-09 DIAGNOSIS — E119 Type 2 diabetes mellitus without complications: Secondary | ICD-10-CM | POA: Diagnosis not present

## 2015-09-09 DIAGNOSIS — R5383 Other fatigue: Secondary | ICD-10-CM | POA: Insufficient documentation

## 2015-09-09 LAB — MYOCARDIAL PERFUSION IMAGING
CHL CUP RESTING HR STRESS: 51 {beats}/min
CSEPPHR: 86 {beats}/min
LVDIAVOL: 97 mL (ref 62–150)
LVSYSVOL: 37 mL
NUC STRESS TID: 1.14
RATE: 0.24
SDS: 0
SRS: 3
SSS: 3

## 2015-09-09 MED ORDER — TECHNETIUM TC 99M SESTAMIBI GENERIC - CARDIOLITE
11.0000 | Freq: Once | INTRAVENOUS | Status: AC | PRN
  Administered 2015-09-09: 11 via INTRAVENOUS

## 2015-09-09 MED ORDER — REGADENOSON 0.4 MG/5ML IV SOLN
0.4000 mg | Freq: Once | INTRAVENOUS | Status: AC
  Administered 2015-09-09: 0.4 mg via INTRAVENOUS

## 2015-09-09 MED ORDER — TECHNETIUM TC 99M SESTAMIBI GENERIC - CARDIOLITE
32.8000 | Freq: Once | INTRAVENOUS | Status: AC | PRN
  Administered 2015-09-09: 32.8 via INTRAVENOUS

## 2015-09-10 ENCOUNTER — Encounter: Payer: Self-pay | Admitting: Physician Assistant

## 2015-09-10 ENCOUNTER — Telehealth: Payer: Self-pay | Admitting: *Deleted

## 2015-09-10 NOTE — Telephone Encounter (Signed)
Pt notified of myoview results by phone with verbal understanding. Pt thanked me for our promptness of getting results to him. Pt asked if I could send to Dr. Jenny Reichmann (PCP). I advised we are in the same computer system, test available for his review as well

## 2015-09-11 DIAGNOSIS — H353211 Exudative age-related macular degeneration, right eye, with active choroidal neovascularization: Secondary | ICD-10-CM | POA: Diagnosis not present

## 2015-09-11 DIAGNOSIS — H353133 Nonexudative age-related macular degeneration, bilateral, advanced atrophic without subfoveal involvement: Secondary | ICD-10-CM | POA: Diagnosis not present

## 2015-09-20 ENCOUNTER — Encounter: Payer: Self-pay | Admitting: Internal Medicine

## 2015-09-20 ENCOUNTER — Telehealth: Payer: Self-pay | Admitting: Physician Assistant

## 2015-09-20 ENCOUNTER — Other Ambulatory Visit (INDEPENDENT_AMBULATORY_CARE_PROVIDER_SITE_OTHER): Payer: Medicare Other

## 2015-09-20 ENCOUNTER — Ambulatory Visit (INDEPENDENT_AMBULATORY_CARE_PROVIDER_SITE_OTHER): Payer: Medicare Other | Admitting: Internal Medicine

## 2015-09-20 ENCOUNTER — Telehealth: Payer: Self-pay

## 2015-09-20 VITALS — BP 138/78 | HR 60 | Temp 98.0°F | Resp 20 | Wt 177.0 lb

## 2015-09-20 DIAGNOSIS — F329 Major depressive disorder, single episode, unspecified: Secondary | ICD-10-CM

## 2015-09-20 DIAGNOSIS — F419 Anxiety disorder, unspecified: Secondary | ICD-10-CM

## 2015-09-20 DIAGNOSIS — E1142 Type 2 diabetes mellitus with diabetic polyneuropathy: Secondary | ICD-10-CM | POA: Diagnosis not present

## 2015-09-20 DIAGNOSIS — E785 Hyperlipidemia, unspecified: Secondary | ICD-10-CM

## 2015-09-20 DIAGNOSIS — E034 Atrophy of thyroid (acquired): Secondary | ICD-10-CM

## 2015-09-20 DIAGNOSIS — F418 Other specified anxiety disorders: Secondary | ICD-10-CM

## 2015-09-20 DIAGNOSIS — E038 Other specified hypothyroidism: Secondary | ICD-10-CM

## 2015-09-20 DIAGNOSIS — I119 Hypertensive heart disease without heart failure: Secondary | ICD-10-CM

## 2015-09-20 LAB — CBC WITH DIFFERENTIAL/PLATELET
BASOS ABS: 0 10*3/uL (ref 0.0–0.1)
Basophils Relative: 0.3 % (ref 0.0–3.0)
Eosinophils Absolute: 0.1 10*3/uL (ref 0.0–0.7)
Eosinophils Relative: 1.7 % (ref 0.0–5.0)
HCT: 43.2 % (ref 39.0–52.0)
Hemoglobin: 14.9 g/dL (ref 13.0–17.0)
LYMPHS ABS: 1.3 10*3/uL (ref 0.7–4.0)
LYMPHS PCT: 22.2 % (ref 12.0–46.0)
MCHC: 34.5 g/dL (ref 30.0–36.0)
MCV: 93.6 fl (ref 78.0–100.0)
MONO ABS: 0.5 10*3/uL (ref 0.1–1.0)
Monocytes Relative: 9.3 % (ref 3.0–12.0)
NEUTROS ABS: 3.9 10*3/uL (ref 1.4–7.7)
NEUTROS PCT: 66.5 % (ref 43.0–77.0)
Platelets: 238 10*3/uL (ref 150.0–400.0)
RBC: 4.62 Mil/uL (ref 4.22–5.81)
RDW: 13.4 % (ref 11.5–15.5)
WBC: 5.9 10*3/uL (ref 4.0–10.5)

## 2015-09-20 LAB — URINALYSIS, ROUTINE W REFLEX MICROSCOPIC
Bilirubin Urine: NEGATIVE
Hgb urine dipstick: NEGATIVE
Ketones, ur: NEGATIVE
Leukocytes, UA: NEGATIVE
Nitrite: NEGATIVE
PH: 5.5 (ref 5.0–8.0)
RBC / HPF: NONE SEEN (ref 0–?)
Specific Gravity, Urine: 1.025 (ref 1.000–1.030)
TOTAL PROTEIN, URINE-UPE24: NEGATIVE
URINE GLUCOSE: NEGATIVE
Urobilinogen, UA: 0.2 (ref 0.0–1.0)
WBC, UA: NONE SEEN (ref 0–?)

## 2015-09-20 LAB — HEPATIC FUNCTION PANEL
ALT: 16 U/L (ref 0–53)
AST: 20 U/L (ref 0–37)
Albumin: 4.6 g/dL (ref 3.5–5.2)
Alkaline Phosphatase: 77 U/L (ref 39–117)
BILIRUBIN DIRECT: 0.2 mg/dL (ref 0.0–0.3)
TOTAL PROTEIN: 7.3 g/dL (ref 6.0–8.3)
Total Bilirubin: 0.7 mg/dL (ref 0.2–1.2)

## 2015-09-20 LAB — LIPID PANEL
CHOL/HDL RATIO: 3
Cholesterol: 171 mg/dL (ref 0–200)
HDL: 62.5 mg/dL (ref >39.00)
LDL Cholesterol: 98 mg/dL (ref 0–99)
NONHDL: 108.51
Triglycerides: 55 mg/dL (ref 0.0–149.0)
VLDL: 11 mg/dL (ref 0.0–40.0)

## 2015-09-20 LAB — BASIC METABOLIC PANEL
BUN: 14 mg/dL (ref 6–23)
CALCIUM: 9.7 mg/dL (ref 8.4–10.5)
CO2: 29 meq/L (ref 19–32)
CREATININE: 1.16 mg/dL (ref 0.40–1.50)
Chloride: 102 mEq/L (ref 96–112)
GFR: 63.58 mL/min (ref >60.00)
Glucose, Bld: 137 mg/dL — ABNORMAL HIGH (ref 70–99)
Potassium: 4.3 mEq/L (ref 3.5–5.1)
Sodium: 139 mEq/L (ref 135–145)

## 2015-09-20 LAB — TSH: TSH: 0.03 u[IU]/mL — AB (ref 0.35–4.50)

## 2015-09-20 LAB — MICROALBUMIN / CREATININE URINE RATIO
Creatinine,U: 130.3 mg/dL
Microalb Creat Ratio: 0.5 mg/g (ref 0.0–30.0)
Microalb, Ur: <0.7 mg/dL (ref 0.0–1.9)

## 2015-09-20 LAB — T3, FREE: T3, Free: 5.9 pg/mL — ABNORMAL HIGH (ref 2.3–4.2)

## 2015-09-20 LAB — HEMOGLOBIN A1C: HEMOGLOBIN A1C: 6.6 % — AB (ref 4.6–6.5)

## 2015-09-20 LAB — T4, FREE: Free T4: 1.02 ng/dL (ref 0.60–1.60)

## 2015-09-20 MED ORDER — LIOTHYRONINE SODIUM 25 MCG PO TABS: 25.0000 ug | ORAL_TABLET | ORAL | Status: DC

## 2015-09-20 MED ORDER — ALPRAZOLAM 0.25 MG PO TABS: 0.2500 mg | ORAL_TABLET | Freq: Two times a day (BID) | ORAL | Status: DC

## 2015-09-20 MED ORDER — MUPIROCIN CALCIUM 2 % NA OINT: 1.0000 "application " | TOPICAL_OINTMENT | Freq: Two times a day (BID) | NASAL | Status: DC

## 2015-09-20 MED ORDER — SILDENAFIL CITRATE 20 MG PO TABS: 20.0000 mg | ORAL_TABLET | Freq: Every day | ORAL | Status: DC | PRN

## 2015-09-20 MED ORDER — METFORMIN HCL 1000 MG PO TABS: 1000.0000 mg | ORAL_TABLET | Freq: Every day | ORAL | Status: DC

## 2015-09-20 MED ORDER — FAMOTIDINE 20 MG PO TABS: 20.0000 mg | ORAL_TABLET | Freq: Two times a day (BID) | ORAL | Status: DC

## 2015-09-20 MED ORDER — CITALOPRAM HYDROBROMIDE 10 MG PO TABS: 10.0000 mg | ORAL_TABLET | Freq: Every day | ORAL | Status: DC

## 2015-09-20 NOTE — Progress Notes (Signed)
Subjective:    Patient ID: Stephen Wilkinson, male    DOB: [birth year], 82 y.o.   MRN: [id number]  HPI "I have a cafeteria list of things today".  Appears quite anxious today.   States left eye vision nearly completely gone with mac degeneration since 2003, more recently now with right eye similar getting avastin injections and has worked fairly well, only very slowly getting worse.  Frustrated he cant do his finances like writing checks b/c he ends up writing the wrong numbers due to vision.  Asks for card referral f/u as he feels he has been assigned a PA with "no card background" after Dr Mare Ferrari retired, has hx of benign heart disease adn TIA's.  Wants to see cardiologist, even out of [place] if need be, though I d/w pt I dont have much control over the cardiologist and the coverage by APP's. Also Wants me to "take over my thyroid", s/p thyroid surgury, has been on synthroid and small cytomel, really wants to be back on this combination.  Wants futher blood tests today. Pt denies chest pain, increased sob or doe, wheezing, orthopnea, PND, increased LE swelling, palpitations, dizziness or syncope.  Pt denies new neurological symptoms such as new headache, or facial or extremity weakness or numbness   Pt denies polydipsia, polyuria, .  Pt states overall good compliance with meds, trying to follow lower cholesterol, diabetic diet, wt overall stable.  Asks for UA as well, as urine color may be off, and is color blind, states "it may be me, I'm getting old". Pt denies chest pain, increased sob or doe, wheezing, orthopnea, PND, increased LE swelling, palpitations, dizziness or syncope.  Pt denies new neurological symptoms such as new headache, or facial or extremity weakness or numbness  Denies worsening depressive symptoms, suicidal ideation, but having increased panic attacks recently.   Denies hyper or hypo thyroid symptoms such as voice, skin or hair change.  Cont's to live with wife who is 84 yrs younger and  together they cope well. Also has c/o bilat nasal sores recurring in the past 2 wks, quite painful, no fever or drainage, swelling currently less. Past Medical History  Diagnosis Date  . Hypertension   . Hyperlipidemia   . Diabetes mellitus   . Hypothyroidism   . Macular degeneration   . OA (osteoarthritis)   . Anxiety   . Depression   . PVC's (premature ventricular contractions)   . Hx: UTI (urinary tract infection)   . Insomnia   . Stroke ([place])     mid 44s   . Pneumonia   . Chronic kidney disease     hx of uti due to catheter hx of prostatitis, BPH -   . Cancer ([place])     basal and squamous skin carcinoma   . Cataract   . Glaucoma   . Blood transfusion without reported diagnosis   . Altered mental status 05/15/2011  . Hypokalemia 05/15/2011  . Acute blood loss anemia 05/15/2011    post surgical  . Glaucoma 01/04/2013  . Hx of transient ischemic attack (TIA) 01/04/2013  . Macular degeneration   . HTN (hypertension) 03/30/2013  . Skin cancer   . Diabetes type 2, controlled ([place])   . Hyperlipidemia   . History of nuclear stress test     Myoview 3/17: EF 62%, no ischemia or scar, low risk   Past Surgical History  Procedure Laterality Date  . Replacement total knee    . Thyroidectomy    .  Appendectomy    . Tonsillectomy and adenoidectomy    . Inguinal hernia repair    . US echocardiography  11/10/2006    EF 55-60%  . US echocardiography  09/20/2002    EF 65-70%  . Other surgical history      microwave procedure for prostate - 09/12   . Total knee arthroplasty  05/11/2011    Procedure: TOTAL KNEE ARTHROPLASTY;  Surgeon: Gearlean Alf;  Location: [facility];  Service: Orthopedics;  Laterality: Right;  . Prostate ablation      microwave    reports that he has never smoked. He has never used smokeless tobacco. He reports that he drinks about 4.2 oz of alcohol per week. He reports that he does not use illicit drugs. family history includes Arthritis in his mother; Diabetes  in his brother and mother; Hypertension in his brother, father, and mother; Kidney disease in his father; Stroke in his father. Allergies  Allergen Reactions  . Lipitor [Atorvastatin Calcium] Other (See Comments)    Myalgia  . Zetia [Ezetimibe] Other (See Comments)    Myalgia   Current Outpatient Prescriptions on File Prior to Visit  Medication Sig Dispense Refill  . ALPRAZolam (XANAX) 0.25 MG tablet Take 1 tablet (0.25 mg total) by mouth 2 (two) times daily. 180 tablet 1  . amLODipine (NORVASC) 5 MG tablet Take 1 tablet (5 mg total) by mouth every morning. 90 tablet 3  . aspirin 81 MG tablet Take 81 mg by mouth daily.    . Bevacizumab (AVASTIN IV) Place 1 drop into the right eye as directed. Every 6 weeks    . clobetasol cream (TEMOVATE) [id number] % Apply 1 application topically daily as needed (for contact dermatitis). Contact dermatitis  3  . clopidogrel (PLAVIX) 75 MG tablet Take 1 tablet (75 mg total) by mouth daily. 90 tablet 3  . digoxin (LANOXIN) 0.125 MG tablet Take 1 tablet (0.125 mg total) by mouth daily. 90 tablet 3  . famotidine (PEPCID) 20 MG tablet Take 1 tablet (20 mg total) by mouth 2 (two) times daily. 180 tablet 3  . glucose blood (ONE TOUCH ULTRA TEST) test strip Use as directed once daily to check blood sugar.  Diagnosis code E11.42 100 each 11  . latanoprost (XALATAN) 0.005 % ophthalmic solution Place 1 drop into both eyes at bedtime.     Marland Kitchen levothyroxine (SYNTHROID, LEVOTHROID) 100 MCG tablet Take 153mcg by mouth on Sunday, Tuesday, Thursday, and Saturday. Take 1.5tablets on Monday Wednesday, and Friday 115 tablet 3  . liothyronine (CYTOMEL) 25 MCG tablet Take 1 tablet (25 mcg total) by mouth every morning. 1 tablet 6 days a week. Pt does not take on sunday 90 tablet 3  . metFORMIN (GLUCOPHAGE) 1000 MG tablet Take 1 tablet (1,000 mg total) by mouth daily. 90 tablet 3  . metoprolol succinate (TOPROL-XL) 25 MG 24 hr tablet Take 1 tablet (25 mg total) by mouth every morning. 90  tablet 3  . multivitamin-lutein (OCUVITE-LUTEIN) CAPS Take 1 capsule by mouth daily.    . sildenafil (REVATIO) 20 MG tablet Take 20 mg by mouth daily as needed (for erectile dysfunction).    Marland Kitchen zolpidem (AMBIEN) 5 MG tablet Take 1 tablet (5 mg total) by mouth at bedtime as needed for sleep. 90 tablet 1  . [DISCONTINUED] warfarin (COUMADIN) 3 MG tablet Take 1 tablet (3 mg total) by mouth one time only at 6 PM. 35 tablet 0   No current facility-administered medications on file prior to visit.  Review of Systems  Constitutional: Negative for unusual diaphoresis or night sweats HENT: Negative for ear swelling or discharge Eyes: Negative for worsening visual haziness  Respiratory: Negative for choking and stridor.   Gastrointestinal: Negative for distension or worsening eructation Genitourinary: Negative for retention or change in urine volume.  Musculoskeletal: Negative for other MSK pain or swelling Skin: Negative for color change and worsening wound Neurological: Negative for tremors and numbness other than noted  Psychiatric/Behavioral: Negative for decreased concentration or agitation other than above       Objective:   Physical Exam BP 138/78 mmHg  Pulse 60  Temp(Src) 98 F (36.7 C) (Oral)  Resp 20  Wt 177 lb (80.287 kg)  SpO2 95% VS noted, non toxic Constitutional: Pt appears in no apparent distress HENT: Head: NCAT.  Right Ear: External ear normal.  Left Ear: External ear normal.  Eyes: . Pupils are equal, round, and reactive to light. Conjunctivae and EOM are normal Neck: Normal range of motion. Neck supple.  Cardiovascular: Normal rate and regular rhythm.   Pulmonary/Chest: Effort normal and breath sounds without rales or wheezing.  Abd:  Soft, NT, ND, + BS Neurological: Pt is alert. Not confused , motor grossly intact Skin: Skin is warm. No rash, no LE edema Psychiatric: Pt behavior is normal. No agitation. 2+ nervous    Assessment & Plan:

## 2015-09-20 NOTE — Assessment & Plan Note (Signed)
stable overall by history and exam, recent data reviewed with pt, and pt to continue medical treatment as before,  to f/u any worsening symptoms or concerns Lab Results  Component Value Date   HGBA1C 6.6* 04/18/2015

## 2015-09-20 NOTE — Telephone Encounter (Signed)
New meds ordered

## 2015-09-20 NOTE — Telephone Encounter (Signed)
Error

## 2015-09-20 NOTE — Assessment & Plan Note (Signed)
I disagree with pt that quality of care is overall less given his evaluations by APP instead of MD, but he is adamant, will refer cardiology

## 2015-09-20 NOTE — Progress Notes (Signed)
Pre visit review using our clinic review tool, if applicable. No additional management support is needed unless otherwise documented below in the visit note. 

## 2015-09-20 NOTE — Patient Instructions (Addendum)
Please take all new medication as prescribed -the citalopram 10 mg per day, and the antibiotic for the nose  Please continue all other medications as before, and refills have been done if requested.  Please have the pharmacy call with any other refills you may need.  Please continue your efforts at being more active, low cholesterol diet, and weight control.  Please keep your appointments with your specialists as you may have planned  You will be contacted regarding the referral for: cardiology  Please go to the LAB in the Basement (turn left off the elevator) for the tests to be done today  You will be contacted by phone if any changes need to be made immediately.  Otherwise, you will receive a letter about your results with an explanation, but please check with MyChart first.  Please remember to sign up for MyChart if you have not done so, as this will be important to you in the future with finding out test results, communicating by private email, and scheduling acute appointments online when needed.  Please return in 6 months, or sooner if needed

## 2015-09-20 NOTE — Assessment & Plan Note (Addendum)
This is most signficant problem today, he has some insight, and willing to start citalopram 10 mg to at least help control the panic episodes,  to f/u any worsening symptoms or concerns  Note:  Total time for pt hx, exam, review of record with pt in the room, determination of diagnoses and plan for further eval and tx is > 40 min, with over 50% spent in coordination and counseling of patient

## 2015-09-20 NOTE — Assessment & Plan Note (Signed)
stable overall by history and exam, recent data reviewed with pt, and pt to continue medical treatment as before,  to f/u any worsening symptoms or concerns Lab Results  Component Value Date   LDLCALC 74 04/18/2015

## 2015-09-20 NOTE — Assessment & Plan Note (Signed)
stable overall by history and exam, recent data reviewed with pt, and pt to continue medical treatment as before,  to f/u any worsening symptoms or concerns Lab Results  Component Value Date   TSH <0.008* 04/18/2015   For cont'd meds, free t4 and free t3 as well

## 2015-09-23 ENCOUNTER — Encounter: Payer: Self-pay | Admitting: Cardiology

## 2015-09-23 NOTE — Telephone Encounter (Signed)
New message     This is a former [facility] pt.  He has been seeing Richardson Dopp since Dr Mare Ferrari retired.  He said his PCP has talked to Dr Stanford Breed and he is now going to be seeing Dr Stanford Breed.  He want to see a doctor and not a PA/NP.  However, pt request to talk to Dr Jacalyn Lefevre nurse prior to being seen to ask "questions".  He is aware the nurse is out and will wait until she returns.  It is not a urgent matter but important.

## 2015-10-07 NOTE — Telephone Encounter (Signed)
This encounter was created in error - please disregard.

## 2015-10-10 DIAGNOSIS — H26491 Other secondary cataract, right eye: Secondary | ICD-10-CM | POA: Diagnosis not present

## 2015-10-23 DIAGNOSIS — H353211 Exudative age-related macular degeneration, right eye, with active choroidal neovascularization: Secondary | ICD-10-CM | POA: Diagnosis not present

## 2015-10-23 DIAGNOSIS — H35372 Puckering of macula, left eye: Secondary | ICD-10-CM | POA: Diagnosis not present

## 2015-10-23 DIAGNOSIS — H35352 Cystoid macular degeneration, left eye: Secondary | ICD-10-CM | POA: Diagnosis not present

## 2015-10-23 DIAGNOSIS — H353133 Nonexudative age-related macular degeneration, bilateral, advanced atrophic without subfoveal involvement: Secondary | ICD-10-CM | POA: Diagnosis not present

## 2015-10-24 ENCOUNTER — Ambulatory Visit (INDEPENDENT_AMBULATORY_CARE_PROVIDER_SITE_OTHER): Payer: Medicare Other | Admitting: Podiatry

## 2015-10-24 ENCOUNTER — Encounter: Payer: Self-pay | Admitting: Podiatry

## 2015-10-24 DIAGNOSIS — E1140 Type 2 diabetes mellitus with diabetic neuropathy, unspecified: Secondary | ICD-10-CM | POA: Diagnosis not present

## 2015-10-24 DIAGNOSIS — M79676 Pain in unspecified toe(s): Secondary | ICD-10-CM | POA: Diagnosis not present

## 2015-10-24 DIAGNOSIS — B351 Tinea unguium: Secondary | ICD-10-CM | POA: Diagnosis not present

## 2015-10-24 NOTE — Progress Notes (Addendum)
Patient ID: Stephen Wilkinson, male   DOB: [birth year], 82 y.o.   MRN: [id number] Complaint:  Visit Type: Patient returns to my office for continued preventative foot care services. Complaint: Patient states" my nails have grown long and thick and become painful to walk and wear shoes" Patient has been diagnosed with DM with no foot complications. The patient presents for preventative foot care services. No changes to ROS.  He has macular degeneration with history of mother and grandmother losing their legs to diabetes.  Podiatric Exam: Vascular: dorsalis pedis and posterior tibial pulses are palpable bilateral. Capillary return is immediate. Temperature gradient is WNL. Skin turgor WNL  Sensorium: Normal Semmes Weinstein monofilament test. Normal tactile sensation bilaterally. Nail Exam: Pt has thick disfigured discolored nails with subungual debris noted bilateral entire nail hallux through fifth toenails Ulcer Exam: There is no evidence of ulcer or pre-ulcerative changes or infection. Orthopedic Exam: Muscle tone and strength are WNL. No limitations in general ROM. No crepitus or effusions noted. Foot type and digits show no abnormalities. Bony prominences are unremarkable. HAV B/L. Skin: . No infection or ulcers. Symptomatic porokeratosis sub 5th right.  Diagnosis:  Onychomycosis, , Pain in right toe, pain in left toes,  Callus sub 5th right foot  Treatment & Plan Procedures and Treatment: Consent by patient was obtained for treatment procedures. The patient understood the discussion of treatment and procedures well. All questions were answered thoroughly reviewed. Debridement of mycotic and hypertrophic toenails, 1 through 5 bilateral and clearing of subungual debris. No ulceration, no infection noted. Debride callus right foot. Return Visit-Office Procedure: Patient instructed to return to the office for a follow up visit 10 weeks  for continued evaluation and treatment.  Gardiner Barefoot DPM

## 2015-11-04 ENCOUNTER — Encounter: Payer: Self-pay | Admitting: Cardiology

## 2015-11-04 NOTE — Progress Notes (Signed)
HPI: Follow-up hypertension and palpitations. Also history of TIA. Previously followed by Dr. Mare Ferrari. Nuclear study March 2017 showed ejection fraction 62% and no ischemia. Since last seen, the patient denies any dyspnea on exertion, orthopnea, PND, pedal edema, palpitations, syncope or chest pain.   Current Outpatient Prescriptions  Medication Sig Dispense Refill  . ALPRAZolam (XANAX) 0.25 MG tablet Take 1 tablet (0.25 mg total) by mouth 2 (two) times daily. 180 tablet 1  . amLODipine (NORVASC) 5 MG tablet Take 1 tablet (5 mg total) by mouth every morning. 90 tablet 3  . aspirin 81 MG tablet Take 81 mg by mouth daily.    . Bevacizumab (AVASTIN IV) Place 1 drop into the right eye as directed. Every 6 weeks    . clobetasol cream (TEMOVATE) [id number] % Apply 1 application topically daily as needed (for contact dermatitis). Contact dermatitis  3  . clopidogrel (PLAVIX) 75 MG tablet Take 1 tablet (75 mg total) by mouth daily. 90 tablet 3  . famotidine (PEPCID) 20 MG tablet Take 1 tablet (20 mg total) by mouth 2 (two) times daily. 180 tablet 3  . glucose blood (ONE TOUCH ULTRA TEST) test strip Use as directed once daily to check blood sugar.  Diagnosis code E11.42 100 each 11  . latanoprost (XALATAN) 0.005 % ophthalmic solution Place 1 drop into both eyes at bedtime.     Marland Kitchen levothyroxine (SYNTHROID, LEVOTHROID) 100 MCG tablet Take 155mcg by mouth on Sunday, Tuesday, Thursday, and Saturday. Take 1.5tablets on Monday Wednesday, and Friday 115 tablet 3  . liothyronine (CYTOMEL) 25 MCG tablet Take 1 tablet (25 mcg total) by mouth every morning. 1 tablet 6 days a week. Pt does not take on sunday 90 tablet 3  . metFORMIN (GLUCOPHAGE) 1000 MG tablet Take 1 tablet (1,000 mg total) by mouth daily. 90 tablet 3  . metoprolol succinate (TOPROL-XL) 25 MG 24 hr tablet Take 1 tablet (25 mg total) by mouth every morning. 90 tablet 3  . multivitamin-lutein (OCUVITE-LUTEIN) CAPS Take 1 capsule by mouth daily.      . mupirocin nasal ointment (BACTROBAN) 2 % Place 1 application into the nose 2 (two) times daily. Use one-half of tube in each nostril twice daily for five (5) days. After application, press sides of nose together and gently massage. 10 g 0  . sildenafil (REVATIO) 20 MG tablet Take 1 tablet (20 mg total) by mouth daily as needed (for erectile dysfunction). 10 tablet 3  . [DISCONTINUED] warfarin (COUMADIN) 3 MG tablet Take 1 tablet (3 mg total) by mouth one time only at 6 PM. 35 tablet 0   No current facility-administered medications for this visit.     Past Medical History  Diagnosis Date  . Hypertension   . Hyperlipidemia   . Diabetes mellitus   . Hypothyroidism   . OA (osteoarthritis)   . Anxiety   . Depression   . PVC's (premature ventricular contractions)   . Hx: UTI (urinary tract infection)   . Insomnia   . Stroke ([place])     mid 8s   . Pneumonia   . Chronic kidney disease     hx of uti due to catheter hx of prostatitis, BPH -   . Cancer ([place])     basal and squamous skin carcinoma   . Cataract   . Blood transfusion without reported diagnosis   . Altered mental status 05/15/2011  . Hypokalemia 05/15/2011  . Acute blood loss anemia 05/15/2011  post surgical  . Glaucoma 01/04/2013  . Hx of transient ischemic attack (TIA) 01/04/2013  . Macular degeneration   . History of nuclear stress test     Myoview 3/17: EF 62%, no ischemia or scar, low risk    Past Surgical History  Procedure Laterality Date  . Replacement total knee    . Thyroidectomy    . Appendectomy    . Tonsillectomy and adenoidectomy    . Inguinal hernia repair    . US echocardiography  11/10/2006    EF 55-60%  . US echocardiography  09/20/2002    EF 65-70%  . Other surgical history      microwave procedure for prostate - 09/12   . Total knee arthroplasty  05/11/2011    Procedure: TOTAL KNEE ARTHROPLASTY;  Surgeon: Gearlean Alf;  Location: [facility];  Service: Orthopedics;  Laterality: Right;  .  Prostate ablation      microwave    Social History   Social History  . Marital Status: Married    Spouse Name: N/A  . Number of Children: 3  . Years of Education: N/A   Occupational History  . 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 Smoking status: Never Smoker   . Smokeless tobacco: Never Used  . Alcohol Use: 4.2 oz/week    7 Shots of liquor per week     Comment: 2 drinks most days  . Drug Use: No  . Sexual Activity: No   Other Topics Concern  . 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 Hypertension Mother   . Arthritis Mother   . Diabetes Mother   . Hypertension Father   . Stroke Father   . Kidney disease Father   . Hypertension Brother   . Diabetes Brother     ROS: no fevers or chills, productive cough, hemoptysis, dysphasia, odynophagia, melena, hematochezia, dysuria, hematuria, rash, seizure activity, orthopnea, PND, pedal edema, claudication. Remaining systems are negative.  Physical Exam: Well-developed well-nourished anxious in no acute distress.  Skin is warm and dry.  HEENT is normal.  Neck is supple.  Chest is clear to auscultation with normal expansion.  Cardiovascular exam is regular rate and rhythm.  Abdominal exam nontender or distended. No masses palpated. Extremities show no edema. neuro grossly intact  ECG 08/23/2015-sinus rhythm, first-degree AV block, cannot rule out prior septal infarct, nonspecific ST changes.

## 2015-11-05 ENCOUNTER — Ambulatory Visit (INDEPENDENT_AMBULATORY_CARE_PROVIDER_SITE_OTHER): Payer: Medicare Other | Admitting: Cardiology

## 2015-11-05 ENCOUNTER — Encounter: Payer: Self-pay | Admitting: Cardiology

## 2015-11-05 VITALS — BP 144/78 | HR 74 | Ht 73.0 in | Wt 174.0 lb

## 2015-11-05 DIAGNOSIS — I119 Hypertensive heart disease without heart failure: Secondary | ICD-10-CM

## 2015-11-05 DIAGNOSIS — E785 Hyperlipidemia, unspecified: Secondary | ICD-10-CM

## 2015-11-05 DIAGNOSIS — Z8673 Personal history of transient ischemic attack (TIA), and cerebral infarction without residual deficits: Secondary | ICD-10-CM

## 2015-11-05 MED ORDER — CLOPIDOGREL BISULFATE 75 MG PO TABS: 75.0000 mg | ORAL_TABLET | Freq: Every day | ORAL | Status: DC

## 2015-11-05 MED ORDER — AMLODIPINE BESYLATE 5 MG PO TABS: 5.0000 mg | ORAL_TABLET | ORAL | Status: DC

## 2015-11-05 MED ORDER — METOPROLOL SUCCINATE ER 25 MG PO TB24: 25.0000 mg | ORAL_TABLET | ORAL | Status: DC

## 2015-11-05 NOTE — Assessment & Plan Note (Signed)
Patient's blood pressure is reasonable for his age.We will continue present medications and follow. Note he is on digoxin without clear indication. We will discontinue this medication.

## 2015-11-05 NOTE — Assessment & Plan Note (Signed)
Management per primary care. 

## 2015-11-05 NOTE — Patient Instructions (Signed)
Medication Instructions:   STOP DIGOXIN  Follow-Up:  Your physician recommends that you schedule a follow-up appointment in: [place]

## 2015-11-12 ENCOUNTER — Other Ambulatory Visit: Payer: Self-pay | Admitting: *Deleted

## 2015-11-12 DIAGNOSIS — Z1283 Encounter for screening for malignant neoplasm of skin: Secondary | ICD-10-CM | POA: Diagnosis not present

## 2015-11-12 DIAGNOSIS — L570 Actinic keratosis: Secondary | ICD-10-CM | POA: Diagnosis not present

## 2015-11-12 DIAGNOSIS — X32XXXD Exposure to sunlight, subsequent encounter: Secondary | ICD-10-CM | POA: Diagnosis not present

## 2015-11-12 DIAGNOSIS — L820 Inflamed seborrheic keratosis: Secondary | ICD-10-CM | POA: Diagnosis not present

## 2015-11-12 MED ORDER — LIOTHYRONINE SODIUM 25 MCG PO TABS: 25.0000 ug | ORAL_TABLET | ORAL | Status: DC

## 2015-11-15 ENCOUNTER — Telehealth: Payer: Self-pay | Admitting: *Deleted

## 2015-11-15 MED ORDER — MUPIROCIN 2 % EX OINT: TOPICAL_OINTMENT | CUTANEOUS | Status: DC

## 2015-11-15 NOTE — Telephone Encounter (Signed)
Received call stating the Bactroban nasal oint that was sent in there is a non release date requesting oint to be change to the plain Mupirocin oint. Sent electronically,.../lmb

## 2015-12-04 DIAGNOSIS — H353211 Exudative age-related macular degeneration, right eye, with active choroidal neovascularization: Secondary | ICD-10-CM | POA: Diagnosis not present

## 2015-12-27 ENCOUNTER — Ambulatory Visit: Payer: Medicare Other | Admitting: Physician Assistant

## 2016-01-02 ENCOUNTER — Encounter: Payer: Self-pay | Admitting: Podiatry

## 2016-01-02 ENCOUNTER — Ambulatory Visit (INDEPENDENT_AMBULATORY_CARE_PROVIDER_SITE_OTHER): Payer: Medicare Other | Admitting: Podiatry

## 2016-01-02 DIAGNOSIS — M79676 Pain in unspecified toe(s): Secondary | ICD-10-CM

## 2016-01-02 DIAGNOSIS — B351 Tinea unguium: Secondary | ICD-10-CM

## 2016-01-02 DIAGNOSIS — E1140 Type 2 diabetes mellitus with diabetic neuropathy, unspecified: Secondary | ICD-10-CM | POA: Diagnosis not present

## 2016-01-02 NOTE — Progress Notes (Signed)
Patient ID: Stephen Wilkinson, male   DOB: 09-Nov-1928, 82 y.o.   MRN: [id number] Complaint:  Visit Type: Patient returns to my office for continued preventative foot care services. Complaint: Patient states" my nails have grown long and thick and become painful to walk and wear shoes" Patient has been diagnosed with DM with no foot complications. The patient presents for preventative foot care services. No changes to ROS.  He has macular degeneration with history of mother and grandmother losing their legs to diabetes.  Podiatric Exam: Vascular: dorsalis pedis and posterior tibial pulses are palpable bilateral. Capillary return is immediate. Temperature gradient is WNL. Skin turgor WNL  Sensorium: Normal Semmes Weinstein monofilament test. Normal tactile sensation bilaterally. Nail Exam: Pt has thick disfigured discolored nails with subungual debris noted bilateral entire nail hallux through fifth toenails Ulcer Exam: There is no evidence of ulcer or pre-ulcerative changes or infection. Orthopedic Exam: Muscle tone and strength are WNL. No limitations in general ROM. No crepitus or effusions noted. Foot type and digits show no abnormalities. Bony prominences are unremarkable. HAV B/L. Skin: . No infection or ulcers.Asymptomatic porokeratosis sub 5th right.  Diagnosis:  Onychomycosis, , Pain in right toe, pain in left toes,  Callus sub 5th right  Treatment & Plan Procedures and Treatment: Consent by patient was obtained for treatment procedures. The patient understood the discussion of treatment and procedures well. All questions were answered thoroughly reviewed. Debridement of mycotic and hypertrophic toenails, 1 through 5 bilateral and clearing of subungual debris. No ulceration, no infection noted. Debride callus right foot. Return Visit-Office Procedure: Patient instructed to return to the office for a follow up visit 10 weeks  for continued evaluation and treatment.  Gardiner Barefoot DPM

## 2016-01-15 DIAGNOSIS — H353211 Exudative age-related macular degeneration, right eye, with active choroidal neovascularization: Secondary | ICD-10-CM | POA: Diagnosis not present

## 2016-01-15 DIAGNOSIS — H35372 Puckering of macula, left eye: Secondary | ICD-10-CM | POA: Diagnosis not present

## 2016-01-15 DIAGNOSIS — H35352 Cystoid macular degeneration, left eye: Secondary | ICD-10-CM | POA: Diagnosis not present

## 2016-01-15 DIAGNOSIS — H43821 Vitreomacular adhesion, right eye: Secondary | ICD-10-CM | POA: Diagnosis not present

## 2016-01-15 DIAGNOSIS — H353133 Nonexudative age-related macular degeneration, bilateral, advanced atrophic without subfoveal involvement: Secondary | ICD-10-CM | POA: Diagnosis not present

## 2016-01-16 ENCOUNTER — Telehealth: Payer: Self-pay

## 2016-01-16 NOTE — Telephone Encounter (Signed)
Patient states he having eye surgery on 8/17. He needs to come in on 8/09 and have an INR blood test done before this surgery. And once we get the results he needs it faxed it over [phone]. He is very worried about making sure this is done. HE states he does not need to see Dr. Jenny Reichmann he just needs the blood work put in. PLease advise or follow up, Thank you.

## 2016-01-16 NOTE — Telephone Encounter (Signed)
Very sorry  We do not perform blood testing for other physicians at our private lab  Please have the procedural clinician arrange for INR and then follow up any lab abnormality

## 2016-01-16 NOTE — Telephone Encounter (Signed)
Patient called back I informed him of this and he was upset about it. He will call the other office. If you have anything that needs to be added please feel free to call him back.

## 2016-01-29 DIAGNOSIS — H5412 Blindness, left eye, low vision right eye: Secondary | ICD-10-CM | POA: Diagnosis not present

## 2016-01-29 DIAGNOSIS — H43821 Vitreomacular adhesion, right eye: Secondary | ICD-10-CM | POA: Diagnosis not present

## 2016-01-29 DIAGNOSIS — H35371 Puckering of macula, right eye: Secondary | ICD-10-CM | POA: Diagnosis not present

## 2016-02-13 DIAGNOSIS — Z09 Encounter for follow-up examination after completed treatment for conditions other than malignant neoplasm: Secondary | ICD-10-CM | POA: Diagnosis not present

## 2016-02-13 DIAGNOSIS — H35371 Puckering of macula, right eye: Secondary | ICD-10-CM | POA: Diagnosis not present

## 2016-02-25 ENCOUNTER — Encounter: Payer: Self-pay | Admitting: Cardiology

## 2016-02-26 DIAGNOSIS — H353211 Exudative age-related macular degeneration, right eye, with active choroidal neovascularization: Secondary | ICD-10-CM | POA: Diagnosis not present

## 2016-03-07 NOTE — Progress Notes (Signed)
[id number] hypertension and palpitations. Also history of TIA. Echo 2008 showed normal LV function; grade 1 DD; mild LVH; trace AI; mild MR and TR. Nuclear study March 2017 showed ejection fraction 62% and no ischemia. Since last seen, the patient denies any dyspnea on exertion, orthopnea, PND, pedal edema, palpitations, syncope or chest pain.   Current Outpatient Prescriptions  Medication Sig Dispense Refill  . ALPRAZolam (XANAX) 0.25 MG tablet Take 1 tablet (0.25 mg total) by mouth 2 (two) times daily. 180 tablet 1  . amLODipine (NORVASC) 5 MG tablet Take 1 tablet (5 mg total) by mouth every morning. 90 tablet 3  . aspirin 81 MG tablet Take 81 mg by mouth daily.    . Bevacizumab (AVASTIN IV) Place 1 drop into the right eye as directed. Every 6 weeks    . clobetasol cream (TEMOVATE) [id number] % Apply 1 application topically daily as needed (for contact dermatitis). Contact dermatitis  3  . clopidogrel (PLAVIX) 75 MG tablet Take 1 tablet (75 mg total) by mouth daily. 90 tablet 3  . famotidine (PEPCID) 20 MG tablet Take 1 tablet (20 mg total) by mouth 2 (two) times daily. 180 tablet 3  . glucose blood (ONE TOUCH ULTRA TEST) test strip Use as directed once daily to check blood sugar.  Diagnosis code E11.42 100 each 11  . latanoprost (XALATAN) 0.005 % ophthalmic solution Place 1 drop into both eyes at bedtime.     Marland Kitchen levothyroxine (SYNTHROID, LEVOTHROID) 100 MCG tablet Take 167mcg by mouth on Sunday, Tuesday, Thursday, and Saturday. Take 1.5tablets on Monday Wednesday, and Friday 115 tablet 3  . liothyronine (CYTOMEL) 25 MCG tablet Take 1 tablet (25 mcg total) by mouth every morning. Except for sunday 90 tablet 2  . metFORMIN (GLUCOPHAGE) 1000 MG tablet Take 1 tablet (1,000 mg total) by mouth daily. 90 tablet 3  . metoprolol succinate (TOPROL-XL) 25 MG 24 hr tablet Take 1 tablet (25 mg total) by mouth every morning. 90 tablet 3  . multivitamin-lutein (OCUVITE-LUTEIN) CAPS Take 1 capsule by  mouth daily.    . sildenafil (REVATIO) 20 MG tablet Take 1 tablet (20 mg total) by mouth daily as needed (for erectile dysfunction). 10 tablet 3   No current facility-administered medications for this visit.      Past Medical History:  Diagnosis Date  . Acute blood loss anemia 05/15/2011   post surgical  . Altered mental status 05/15/2011  . Anxiety   . Blood transfusion without reported diagnosis   . Cancer ([place])    basal and squamous skin carcinoma   . Cataract   . Chronic kidney disease    hx of uti due to catheter hx of prostatitis, BPH -   . Depression   . Diabetes mellitus   . Glaucoma 01/04/2013  . History of nuclear stress test    Myoview 3/17: EF 62%, no ischemia or scar, low risk  . Hx of transient ischemic attack (TIA) 01/04/2013  . Hx: UTI (urinary tract infection)   . Hyperlipidemia   . Hypertension   . Hypokalemia 05/15/2011  . Hypothyroidism   . Insomnia   . Macular degeneration   . OA (osteoarthritis)   . Pneumonia   . PVC's (premature ventricular contractions)   . Stroke ([place])    mid 1990s     Past Surgical History:  Procedure Laterality Date  . APPENDECTOMY    . INGUINAL HERNIA REPAIR    . OTHER SURGICAL HISTORY  microwave procedure for prostate - 09/12   . PROSTATE ABLATION     microwave  . REPLACEMENT TOTAL KNEE    . THYROIDECTOMY    . TONSILLECTOMY AND ADENOIDECTOMY    . TOTAL KNEE ARTHROPLASTY  05/11/2011   Procedure: TOTAL KNEE ARTHROPLASTY;  Surgeon: Gearlean Alf;  Location: [facility];  Service: Orthopedics;  Laterality: Right;  . US ECHOCARDIOGRAPHY  11/10/2006   EF 55-60%  . US ECHOCARDIOGRAPHY  09/20/2002   EF 65-70%    Social History   Social History  . Marital status: Married    Spouse name: N/A  . Number of children: 3  . Years of education: N/A   Occupational History  . 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 Smoking status: Never Smoker  . Smokeless tobacco: Never Used  .  Alcohol use 4.2 oz/week    7 Shots of liquor per week     Comment: 2 drinks most days  . Drug use: No  . Sexual activity: No   Other Topics Concern  . 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 Hypertension Mother   . Arthritis Mother   . Diabetes Mother   . Hypertension Father   . Stroke Father   . Kidney disease Father   . Hypertension Brother   . Diabetes Brother     [id number] leg cramping but  no fevers or chills, productive cough, hemoptysis, dysphasia, odynophagia, melena, hematochezia, dysuria, hematuria, rash, seizure activity, orthopnea, PND, pedal edema, claudication. Remaining systems are negative.  Physical Exam: Well-developed well-nourished in no acute distress.  Skin is warm and dry.  HEENT is normal.  Neck is supple.  Chest is clear to auscultation with normal expansion.  Cardiovascular exam is regular rate and rhythm.  Abdominal exam nontender or distended. No masses palpated. Extremities show no edema. neuro grossly intact  ECG-Sinus bradycardia with first-degree AV block. Cannot rule out prior septal infarct.  A/P  1 hypertension-blood pressure is controlled. Continue present medications.  2 TIA-continue aspirin and Plavix.  3 hyperlipidemia-management per primary care.  Kirk Ruths, MD

## 2016-03-10 ENCOUNTER — Encounter: Payer: Self-pay | Admitting: Cardiology

## 2016-03-10 ENCOUNTER — Ambulatory Visit (INDEPENDENT_AMBULATORY_CARE_PROVIDER_SITE_OTHER): Payer: Medicare Other | Admitting: Cardiology

## 2016-03-10 VITALS — BP 136/82 | HR 57 | Ht 73.0 in | Wt 180.0 lb

## 2016-03-10 DIAGNOSIS — E785 Hyperlipidemia, unspecified: Secondary | ICD-10-CM | POA: Diagnosis not present

## 2016-03-10 DIAGNOSIS — G458 Other transient cerebral ischemic attacks and related syndromes: Secondary | ICD-10-CM

## 2016-03-10 DIAGNOSIS — I10 Essential (primary) hypertension: Secondary | ICD-10-CM | POA: Diagnosis not present

## 2016-03-10 NOTE — Patient Instructions (Signed)
Your physician wants you to follow-up in: 6 MONTHS WITH DR CRENSHAW You will receive a reminder letter in the mail two months in advance. If you Orey't receive a letter, please call our office to schedule the follow-up appointment.   If you need a refill on your cardiac medications before your next appointment, please call your pharmacy.  

## 2016-03-12 ENCOUNTER — Encounter: Payer: Self-pay | Admitting: Podiatry

## 2016-03-12 ENCOUNTER — Ambulatory Visit (INDEPENDENT_AMBULATORY_CARE_PROVIDER_SITE_OTHER): Payer: Medicare Other | Admitting: Podiatry

## 2016-03-12 VITALS — BP 187/91 | HR 57 | Resp 14

## 2016-03-12 DIAGNOSIS — E1140 Type 2 diabetes mellitus with diabetic neuropathy, unspecified: Secondary | ICD-10-CM | POA: Diagnosis not present

## 2016-03-12 DIAGNOSIS — B351 Tinea unguium: Secondary | ICD-10-CM | POA: Diagnosis not present

## 2016-03-12 DIAGNOSIS — M79676 Pain in unspecified toe(s): Secondary | ICD-10-CM | POA: Diagnosis not present

## 2016-03-12 DIAGNOSIS — M216X9 Other acquired deformities of unspecified foot: Secondary | ICD-10-CM

## 2016-03-12 NOTE — Progress Notes (Signed)
Patient ID: Stephen Wilkinson, male   DOB: 1928/08/27, 82 y.o.   MRN: [id number] Complaint:  Visit Type: Patient returns to my office for continued preventative foot care services. Complaint: Patient states" my nails have grown long and thick and become painful to walk and wear shoes" Patient has been diagnosed with DM with no foot complications. The patient presents for preventative foot care services. No changes to ROS.  He has macular degeneration with history of mother and grandmother losing their legs to diabetes.  Podiatric Exam: Vascular: dorsalis pedis and posterior tibial pulses are palpable bilateral. Capillary return is immediate. Temperature gradient is WNL. Skin turgor WNL  Sensorium: Normal Semmes Weinstein monofilament test. Normal tactile sensation bilaterally. Nail Exam: Pt has thick disfigured discolored nails with subungual debris noted bilateral entire nail hallux through fifth toenails Ulcer Exam: There is no evidence of ulcer or pre-ulcerative changes or infection. Orthopedic Exam: Muscle tone and strength are WNL. No limitations in general ROM. No crepitus or effusions noted. Foot type and digits show no abnormalities. Bony prominences are unremarkable. HAV B/L. Skin: . No infection or ulcers.Asymptomatic porokeratosis sub 5th right.  Diagnosis:  Onychomycosis, , Pain in right toe, pain in left toes,  Callus sub 5th right  Treatment & Plan Procedures and Treatment: Consent by patient was obtained for treatment procedures. The patient understood the discussion of treatment and procedures well. All questions were answered thoroughly reviewed. Debridement of mycotic and hypertrophic toenails, 1 through 5 bilateral and clearing of subungual debris. No ulceration, no infection noted. Debride callus right foot. Return Visit-Office Procedure: Patient instructed to return to the office for a follow up visit 10 weeks  for continued evaluation and treatment.  Gardiner Barefoot DPM

## 2016-03-24 ENCOUNTER — Ambulatory Visit (INDEPENDENT_AMBULATORY_CARE_PROVIDER_SITE_OTHER): Payer: Medicare Other | Admitting: Internal Medicine

## 2016-03-24 ENCOUNTER — Encounter: Payer: Self-pay | Admitting: Internal Medicine

## 2016-03-24 ENCOUNTER — Other Ambulatory Visit (INDEPENDENT_AMBULATORY_CARE_PROVIDER_SITE_OTHER): Payer: Medicare Other

## 2016-03-24 VITALS — BP 136/82 | HR 69 | Temp 98.9°F | Resp 20 | Wt 178.0 lb

## 2016-03-24 DIAGNOSIS — R413 Other amnesia: Secondary | ICD-10-CM | POA: Insufficient documentation

## 2016-03-24 DIAGNOSIS — Z23 Encounter for immunization: Secondary | ICD-10-CM | POA: Diagnosis not present

## 2016-03-24 DIAGNOSIS — E034 Atrophy of thyroid (acquired): Secondary | ICD-10-CM

## 2016-03-24 DIAGNOSIS — E785 Hyperlipidemia, unspecified: Secondary | ICD-10-CM

## 2016-03-24 DIAGNOSIS — E1142 Type 2 diabetes mellitus with diabetic polyneuropathy: Secondary | ICD-10-CM | POA: Diagnosis not present

## 2016-03-24 LAB — CBC WITH DIFFERENTIAL/PLATELET
BASOS PCT: 0.4 % (ref 0.0–3.0)
Basophils Absolute: 0 10*3/uL (ref 0.0–0.1)
EOS PCT: 1.7 % (ref 0.0–5.0)
Eosinophils Absolute: 0.1 10*3/uL (ref 0.0–0.7)
HCT: 43.2 % (ref 39.0–52.0)
HEMOGLOBIN: 14.8 g/dL (ref 13.0–17.0)
LYMPHS ABS: 1.4 10*3/uL (ref 0.7–4.0)
Lymphocytes Relative: 23.6 % (ref 12.0–46.0)
MCHC: 34.3 g/dL (ref 30.0–36.0)
MCV: 94.1 fl (ref 78.0–100.0)
MONO ABS: 0.6 10*3/uL (ref 0.1–1.0)
Monocytes Relative: 9.5 % (ref 3.0–12.0)
NEUTROS ABS: 3.8 10*3/uL (ref 1.4–7.7)
Neutrophils Relative %: 64.8 % (ref 43.0–77.0)
PLATELETS: 291 10*3/uL (ref 150.0–400.0)
RBC: 4.59 Mil/uL (ref 4.22–5.81)
RDW: 12.9 % (ref 11.5–15.5)
WBC: 5.9 10*3/uL (ref 4.0–10.5)

## 2016-03-24 LAB — HEPATIC FUNCTION PANEL
ALT: 19 U/L (ref 0–53)
AST: 19 U/L (ref 0–37)
Albumin: 4.4 g/dL (ref 3.5–5.2)
Alkaline Phosphatase: 76 U/L (ref 39–117)
BILIRUBIN DIRECT: 0.1 mg/dL (ref 0.0–0.3)
BILIRUBIN TOTAL: 0.5 mg/dL (ref 0.2–1.2)
Total Protein: 7.5 g/dL (ref 6.0–8.3)

## 2016-03-24 LAB — LIPID PANEL
CHOL/HDL RATIO: 3
Cholesterol: 168 mg/dL (ref 0–200)
HDL: 58.1 mg/dL (ref >39.00)
LDL Cholesterol: 96 mg/dL (ref 0–99)
NONHDL: 109.4
Triglycerides: 67 mg/dL (ref 0.0–149.0)
VLDL: 13.4 mg/dL (ref 0.0–40.0)

## 2016-03-24 LAB — MICROALBUMIN / CREATININE URINE RATIO
CREATININE, U: 74 mg/dL
MICROALB/CREAT RATIO: 0.9 mg/g (ref 0.0–30.0)
Microalb, Ur: <0.7 mg/dL (ref 0.0–1.9)

## 2016-03-24 LAB — URINALYSIS, ROUTINE W REFLEX MICROSCOPIC
Bilirubin Urine: NEGATIVE
Hgb urine dipstick: NEGATIVE
Ketones, ur: NEGATIVE
Leukocytes, UA: NEGATIVE
NITRITE: NEGATIVE
PH: 5.5 (ref 5.0–8.0)
SPECIFIC GRAVITY, URINE: 1.01 (ref 1.000–1.030)
Total Protein, Urine: NEGATIVE
URINE GLUCOSE: NEGATIVE
Urobilinogen, UA: 0.2 (ref 0.0–1.0)

## 2016-03-24 LAB — BASIC METABOLIC PANEL
BUN: 14 mg/dL (ref 6–23)
CHLORIDE: 104 meq/L (ref 96–112)
CO2: 26 mEq/L (ref 19–32)
CREATININE: 1.14 mg/dL (ref 0.40–1.50)
Calcium: 9.7 mg/dL (ref 8.4–10.5)
GFR: 64.79 mL/min (ref >60.00)
Glucose, Bld: 124 mg/dL — ABNORMAL HIGH (ref 70–99)
POTASSIUM: 4.3 meq/L (ref 3.5–5.1)
Sodium: 142 mEq/L (ref 135–145)

## 2016-03-24 LAB — T3, FREE: T3 FREE: 4.6 pg/mL — AB (ref 2.3–4.2)

## 2016-03-24 LAB — TSH: TSH: 0.04 u[IU]/mL — AB (ref 0.35–4.50)

## 2016-03-24 LAB — T4, FREE: FREE T4: 0.86 ng/dL (ref 0.60–1.60)

## 2016-03-24 LAB — VITAMIN B12: VITAMIN B 12: 188 pg/mL — AB (ref 211–911)

## 2016-03-24 LAB — HEMOGLOBIN A1C: Hgb A1c MFr Bld: 6.3 % (ref 4.6–6.5)

## 2016-03-24 MED ORDER — FAMOTIDINE 20 MG PO TABS: 20.0000 mg | ORAL_TABLET | Freq: Two times a day (BID) | ORAL | 3 refills | Status: DC

## 2016-03-24 MED ORDER — GLUCOSE BLOOD VI STRP: ORAL_STRIP | 11 refills | Status: DC

## 2016-03-24 MED ORDER — LEVOTHYROXINE SODIUM 100 MCG PO TABS: ORAL_TABLET | ORAL | 3 refills | Status: DC

## 2016-03-24 MED ORDER — LIOTHYRONINE SODIUM 25 MCG PO TABS: 25.0000 ug | ORAL_TABLET | ORAL | 3 refills | Status: DC

## 2016-03-24 MED ORDER — SILDENAFIL CITRATE 100 MG PO TABS: 50.0000 mg | ORAL_TABLET | Freq: Every day | ORAL | 11 refills | Status: DC | PRN

## 2016-03-24 NOTE — Progress Notes (Signed)
Pre visit review using our clinic review tool, if applicable. No additional management support is needed unless otherwise documented below in the visit note. 

## 2016-03-24 NOTE — Progress Notes (Signed)
Subjective:    Patient ID: Stephen Wilkinson, male    DOB: 07/14/1928, 82 y.o.   MRN: [id number]  HPI  Here for yearly f/u;  Overall doing ok;  Pt denies Chest pain, worsening SOB, DOE, wheezing, orthopnea, PND, worsening LE edema, palpitations, dizziness or syncope.  Pt denies neurological change such as new headache, facial or extremity weakness.  Pt denies polydipsia, polyuria, or low sugar symptoms. Pt states overall good compliance with treatment and medications, good tolerability, and has been trying to follow appropriate diet.  Pt denies worsening depressive symptoms, suicidal ideation or panic. No fever, night sweats, wt loss, loss of appetite, or other constitutional symptoms.  Pt states good ability with ADL's, has low fall risk, home safety reviewed and adequate, no other significant changes in hearing, but vision has gotten some worse , sees Dr Leone Payor on a regular basis,, and only occasionally active with exercise, has some stiffness and slowness assoc with bilat knees but no pain after bilat TKR.  Marland Kitchen"I'm getting older, having some memory issues and I worry too much, with more trouble with basic math like a checkbook."  Physically feels like he is doing ok. Sees cardiology on regular basis.  Can get some bilat hand pain low palmar he thinks is MSK after yard work.  Asks for lab work, declines MRI brain today or neurology referral  Revatio not working well, wants to go back to viagra.   Past Medical History:  Diagnosis Date  . Acute blood loss anemia 05/15/2011   post surgical  . Altered mental status 05/15/2011  . Anxiety   . Blood transfusion without reported diagnosis   . Cancer ([place])    basal and squamous skin carcinoma   . Cataract   . Chronic kidney disease    hx of uti due to catheter hx of prostatitis, BPH -   . Depression   . Diabetes mellitus   . Glaucoma 01/04/2013  . History of nuclear stress test    Myoview 3/17: EF 62%, no ischemia or scar, low risk  . Hx of transient  ischemic attack (TIA) 01/04/2013  . Hx: UTI (urinary tract infection)   . Hyperlipidemia   . Hypertension   . Hypokalemia 05/15/2011  . Hypothyroidism   . Insomnia   . Macular degeneration   . OA (osteoarthritis)   . Pneumonia   . PVC's (premature ventricular contractions)   . Stroke ([place])    mid 1990s    Past Surgical History:  Procedure Laterality Date  . APPENDECTOMY    . INGUINAL HERNIA REPAIR    . OTHER SURGICAL HISTORY     microwave procedure for prostate - 09/12   . PROSTATE ABLATION     microwave  . REPLACEMENT TOTAL KNEE    . THYROIDECTOMY    . TONSILLECTOMY AND ADENOIDECTOMY    . TOTAL KNEE ARTHROPLASTY  05/11/2011   Procedure: TOTAL KNEE ARTHROPLASTY;  Surgeon: Gearlean Alf;  Location: [facility];  Service: Orthopedics;  Laterality: Right;  . US ECHOCARDIOGRAPHY  11/10/2006   EF 55-60%  . US ECHOCARDIOGRAPHY  09/20/2002   EF 65-70%    reports that he has never smoked. He has never used smokeless tobacco. He reports that he drinks about 4.2 oz of alcohol per week . He reports that he does not use drugs. family history includes Arthritis in his mother; Diabetes in his brother and mother; Hypertension in his brother, father, and mother; Kidney disease in his father; Stroke in his father. No  Known Allergies Current Outpatient Prescriptions on File Prior to Visit  Medication Sig Dispense Refill  . ALPRAZolam (XANAX) 0.25 MG tablet Take 1 tablet (0.25 mg total) by mouth 2 (two) times daily. 180 tablet 1  . amLODipine (NORVASC) 5 MG tablet Take 1 tablet (5 mg total) by mouth every morning. 90 tablet 3  . aspirin 81 MG tablet Take 81 mg by mouth daily.    . Bevacizumab (AVASTIN IV) Place 1 drop into the right eye as directed. Every 6 weeks    . clobetasol cream (TEMOVATE) [id number] % Apply 1 application topically daily as needed (for contact dermatitis). Contact dermatitis  3  . clopidogrel (PLAVIX) 75 MG tablet Take 1 tablet (75 mg total) by mouth daily. 90 tablet 3  . famotidine  (PEPCID) 20 MG tablet Take 1 tablet (20 mg total) by mouth 2 (two) times daily. 180 tablet 3  . glucose blood (ONE TOUCH ULTRA TEST) test strip Use as directed once daily to check blood sugar.  Diagnosis code E11.42 100 each 11  . latanoprost (XALATAN) 0.005 % ophthalmic solution Place 1 drop into both eyes at bedtime.     Marland Kitchen levothyroxine (SYNTHROID, LEVOTHROID) 100 MCG tablet Take 199mcg by mouth on Sunday, Tuesday, Thursday, and Saturday. Take 1.5tablets on Monday Wednesday, and Friday 115 tablet 3  . liothyronine (CYTOMEL) 25 MCG tablet Take 1 tablet (25 mcg total) by mouth every morning. Except for sunday 90 tablet 2  . metFORMIN (GLUCOPHAGE) 1000 MG tablet Take 1 tablet (1,000 mg total) by mouth daily. 90 tablet 3  . metoprolol succinate (TOPROL-XL) 25 MG 24 hr tablet Take 1 tablet (25 mg total) by mouth every morning. 90 tablet 3  . multivitamin-lutein (OCUVITE-LUTEIN) CAPS Take 1 capsule by mouth daily.    . sildenafil (REVATIO) 20 MG tablet Take 1 tablet (20 mg total) by mouth daily as needed (for erectile dysfunction). 10 tablet 3  . [DISCONTINUED] warfarin (COUMADIN) 3 MG tablet Take 1 tablet (3 mg total) by mouth one time only at 6 PM. 35 tablet 0   No current facility-administered medications on file prior to visit.    Review of Systems Constitutional: Negative for increased diaphoresis, or other activity, appetite or siginficant weight change other than noted HENT: Negative for worsening hearing loss, ear pain, facial swelling, mouth sores and neck stiffness.   Eyes: Negative for other worsening pain, redness or visual disturbance.  Respiratory: Negative for choking or stridor Cardiovascular: Negative for other chest pain and palpitations.  Gastrointestinal: Negative for worsening diarrhea, blood in stool, or abdominal distention Genitourinary: Negative for hematuria, flank pain or change in urine volume.  Musculoskeletal: Negative for myalgias or other joint complaints.  Skin:  Negative for other color change and wound or drainage.  Neurological: Negative for syncope and numbness. other than noted Hematological: Negative for adenopathy. or other swelling Psychiatric/Behavioral: Negative for hallucinations, SI, self-injury, decreased concentration or other worsening agitation.      Objective:   Physical Exam BP 136/82   Pulse 69   Temp 98.9 F (37.2 C) (Oral)   Resp 20   Wt 178 lb (80.7 kg)   SpO2 97%   BMI 23.48 kg/m  VS noted,  Constitutional: Pt is oriented to person, place, and time. Appears well-developed and well-nourished, in no significant distress Head: Normocephalic and atraumatic  Eyes: Conjunctivae and EOM are normal. Pupils are equal, round, and reactive to light Right Ear: External ear normal.  Left Ear: External ear normal Nose: Nose normal.  Mouth/Throat: Oropharynx is clear and moist  Neck: Normal range of motion. Neck supple. No JVD present. No tracheal deviation present or significant neck LA or mass Cardiovascular: Normal rate, regular rhythm, normal heart sounds and intact distal pulses.   Pulmonary/Chest: Effort normal and breath sounds without rales or wheezing  Abdominal: Soft. Bowel sounds are normal. NT. No HSM  Musculoskeletal: Normal range of motion. Exhibits no edema Lymphadenopathy: Has no cervical adenopathy.  Neurological: Pt is alert and oriented to person, place, and time. Pt has normal reflexes. No cranial nerve deficit. Motor 5/5 intact Skin: Skin is warm and dry. No rash noted or new ulcers Psychiatric:  Has mild nervous mood and affect. Behavior is normal.  Hand OA changes moderate  Assessment & Plan:

## 2016-03-24 NOTE — Patient Instructions (Signed)
Please take all new medication as prescribed - the viagra  Please continue all other medications as before, and refills have been done if requested. - in hardcopy  Please have the pharmacy call with any other refills you may need.  Please continue your efforts at being more active, low cholesterol diet, and weight control.  You are otherwise up to date with prevention measures today.  Please keep your appointments with your specialists as you may have planned  Please go to the LAB in the Basement (turn left off the elevator) for the tests to be done today  You will be contacted by phone if any changes need to be made immediately.  Otherwise, you will receive a letter about your results with an explanation, but please check with MyChart first.  Please remember to sign up for MyChart if you have not done so, as this will be important to you in the future with finding out test results, communicating by private email, and scheduling acute appointments online when needed.  Please return in 6 months, or sooner if needed

## 2016-03-28 NOTE — Assessment & Plan Note (Signed)
stable overall by history and exam, recent data reviewed with pt, and pt to continue medical treatment as before,  to f/u any worsening symptoms or concerns Lab Results  Component Value Date   HGBA1C 6.3 03/24/2016

## 2016-03-28 NOTE — Assessment & Plan Note (Signed)
stable overall by history and exam, recent data reviewed with pt, and pt to continue medical treatment as before,  to f/u any worsening symptoms or concerns Lab Results  Component Value Date   LDLCALC 96 03/24/2016

## 2016-03-28 NOTE — Assessment & Plan Note (Signed)
Mild subjective worsening recently, likely has some mild cognitive impairment, declines further eval or tx at this time,  to f/u any worsening symptoms or concerns

## 2016-03-28 NOTE — Assessment & Plan Note (Signed)
stable overall by history and exam, and pt to continue medical treatment as before,  to f/u any worsening symptoms or concerns, for f/u lab today 

## 2016-04-02 DIAGNOSIS — H353211 Exudative age-related macular degeneration, right eye, with active choroidal neovascularization: Secondary | ICD-10-CM | POA: Diagnosis not present

## 2016-04-27 DIAGNOSIS — E119 Type 2 diabetes mellitus without complications: Secondary | ICD-10-CM | POA: Diagnosis not present

## 2016-04-27 DIAGNOSIS — H353213 Exudative age-related macular degeneration, right eye, with inactive scar: Secondary | ICD-10-CM | POA: Diagnosis not present

## 2016-04-27 DIAGNOSIS — H52203 Unspecified astigmatism, bilateral: Secondary | ICD-10-CM | POA: Diagnosis not present

## 2016-04-27 DIAGNOSIS — H353223 Exudative age-related macular degeneration, left eye, with inactive scar: Secondary | ICD-10-CM | POA: Diagnosis not present

## 2016-04-27 LAB — HM DIABETES EYE EXAM

## 2016-04-28 ENCOUNTER — Encounter: Payer: Self-pay | Admitting: Internal Medicine

## 2016-04-28 NOTE — Progress Notes (Signed)
Abstract sent

## 2016-05-12 DIAGNOSIS — L570 Actinic keratosis: Secondary | ICD-10-CM | POA: Diagnosis not present

## 2016-05-12 DIAGNOSIS — C4442 Squamous cell carcinoma of skin of scalp and neck: Secondary | ICD-10-CM | POA: Diagnosis not present

## 2016-05-12 DIAGNOSIS — D225 Melanocytic nevi of trunk: Secondary | ICD-10-CM | POA: Diagnosis not present

## 2016-05-12 DIAGNOSIS — X32XXXD Exposure to sunlight, subsequent encounter: Secondary | ICD-10-CM | POA: Diagnosis not present

## 2016-05-13 DIAGNOSIS — H35352 Cystoid macular degeneration, left eye: Secondary | ICD-10-CM | POA: Diagnosis not present

## 2016-05-13 DIAGNOSIS — H353211 Exudative age-related macular degeneration, right eye, with active choroidal neovascularization: Secondary | ICD-10-CM | POA: Diagnosis not present

## 2016-05-13 DIAGNOSIS — H35372 Puckering of macula, left eye: Secondary | ICD-10-CM | POA: Diagnosis not present

## 2016-05-20 ENCOUNTER — Encounter: Payer: Self-pay | Admitting: Podiatry

## 2016-05-20 ENCOUNTER — Ambulatory Visit (INDEPENDENT_AMBULATORY_CARE_PROVIDER_SITE_OTHER): Payer: Medicare Other | Admitting: Podiatry

## 2016-05-20 VITALS — Ht 73.0 in | Wt 178.0 lb

## 2016-05-20 DIAGNOSIS — B351 Tinea unguium: Secondary | ICD-10-CM

## 2016-05-20 DIAGNOSIS — M79676 Pain in unspecified toe(s): Secondary | ICD-10-CM

## 2016-05-20 DIAGNOSIS — E1140 Type 2 diabetes mellitus with diabetic neuropathy, unspecified: Secondary | ICD-10-CM

## 2016-05-20 NOTE — Progress Notes (Signed)
Patient ID: Stephen Wilkinson, male   DOB: [birth year], 83 y.o.   MRN: [id number] Complaint:  Visit Type: Patient returns to my office for continued preventative foot care services. Complaint: Patient states" my nails have grown long and thick and become painful to walk and wear shoes" Patient has been diagnosed with DM with no foot complications. The patient presents for preventative foot care services. No changes to ROS.  He has macular degeneration with history of mother and grandmother losing their legs to diabetes.  Podiatric Exam: Vascular: dorsalis pedis and posterior tibial pulses are palpable bilateral. Capillary return is immediate. Temperature gradient is WNL. Skin turgor WNL  Sensorium: Normal Semmes Weinstein monofilament test. Normal tactile sensation bilaterally. Nail Exam: Pt has thick disfigured discolored nails with subungual debris noted bilateral entire nail hallux through fifth toenails Ulcer Exam: There is no evidence of ulcer or pre-ulcerative changes or infection. Orthopedic Exam: Muscle tone and strength are WNL. No limitations in general ROM. No crepitus or effusions noted. Foot type and digits show no abnormalities. Bony prominences are unremarkable. HAV B/L. Skin: . No infection or ulcers.Asymptomatic porokeratosis sub 5th right.  Diagnosis:  Onychomycosis, , Pain in right toe, pain in left toes,  Treatment & Plan Procedures and Treatment: Consent by patient was obtained for treatment procedures. The patient understood the discussion of treatment and procedures well. All questions were answered thoroughly reviewed. Debridement of mycotic and hypertrophic toenails, 1 through 5 bilateral and clearing of subungual debris. No ulceration, no infection noted.  Return Visit-Office Procedure: Patient instructed to return to the office for a follow up visit 10 weeks  for continued evaluation and treatment.  Gardiner Barefoot DPM

## 2016-05-26 ENCOUNTER — Ambulatory Visit: Payer: Self-pay

## 2016-05-26 ENCOUNTER — Encounter: Payer: Self-pay | Admitting: Family Medicine

## 2016-05-26 ENCOUNTER — Ambulatory Visit (INDEPENDENT_AMBULATORY_CARE_PROVIDER_SITE_OTHER): Payer: Medicare Other | Admitting: Family Medicine

## 2016-05-26 VITALS — BP 132/80 | HR 65 | Ht 73.5 in | Wt 182.0 lb

## 2016-05-26 DIAGNOSIS — M1811 Unilateral primary osteoarthritis of first carpometacarpal joint, right hand: Secondary | ICD-10-CM

## 2016-05-26 DIAGNOSIS — M189 Osteoarthritis of first carpometacarpal joint, unspecified: Secondary | ICD-10-CM | POA: Insufficient documentation

## 2016-05-26 DIAGNOSIS — M79641 Pain in right hand: Secondary | ICD-10-CM

## 2016-05-26 NOTE — Patient Instructions (Signed)
Good to see you  You woke up the arthritis.  Ice 20 minutes 2 times daily. Usually after activity and before bed. Wear the brace as much as you want day and night for next 2 weeks and try to decrease over time.  pennsaid pinkie amount topically 2 times daily as needed.  Tylenol 325 mg 3 times a day for arthritis.  Vitamin D 2000 IU daily  See me again in 3-4 weeks and if not better we will try injection.

## 2016-05-26 NOTE — Assessment & Plan Note (Signed)
I believe the patient's fall is secondary to more of a decreased irritation of the degenerative arthritis that was starting better. Patient has some atrophy in the area. Denies any neck pain. No numbness noted. We discussed bracing for short course, topical anti-inflammatories, icing protocol as well as vitamin D supplementation. Follow-up again in 2-3 weeks for further evaluation and treatment.

## 2016-05-26 NOTE — Progress Notes (Signed)
[facility] [address], [place] [address] Phone: [phone] Subjective:     CC: right hand pain after fall.   [id number]  Stephen Wilkinson is a 82 y.o. male coming in with complaint of Right hand pain. 2 weeks ago patient falls on an outstretched hand. Patient had significant pain and swelling of the right thumb initially. States that the swelling, bruising was therefore partially one week and that seemed to get better. Unfortunate since then he continues to have pain in the thumb. Certain movements cause a severe pain. Rates the severity pain is 8 out of 10. Patient states that affecting some of his daily activities. States that there seems to be some mild weakness.     Past Medical History:  Diagnosis Date  . Acute blood loss anemia 05/15/2011   post surgical  . Altered mental status 05/15/2011  . Anxiety   . Blood transfusion without reported diagnosis   . Cancer ([place])    basal and squamous skin carcinoma   . Cataract   . Chronic kidney disease    hx of uti due to catheter hx of prostatitis, BPH -   . Depression   . Diabetes mellitus   . Glaucoma 01/04/2013  . History of nuclear stress test    Myoview 3/17: EF 62%, no ischemia or scar, low risk  . Hx of transient ischemic attack (TIA) 01/04/2013  . Hx: UTI (urinary tract infection)   . Hyperlipidemia   . Hypertension   . Hypokalemia 05/15/2011  . Hypothyroidism   . Insomnia   . Macular degeneration   . OA (osteoarthritis)   . Pneumonia   . PVC's (premature ventricular contractions)   . Stroke ([place])    mid 1990s    Past Surgical History:  Procedure Laterality Date  . APPENDECTOMY    . INGUINAL HERNIA REPAIR    . OTHER SURGICAL HISTORY     microwave procedure for prostate - 09/12   . PROSTATE ABLATION     microwave  . REPLACEMENT TOTAL KNEE    . THYROIDECTOMY    . TONSILLECTOMY AND ADENOIDECTOMY    . TOTAL KNEE ARTHROPLASTY  05/11/2011   Procedure: TOTAL KNEE ARTHROPLASTY;   Surgeon: Gearlean Alf;  Location: [facility];  Service: Orthopedics;  Laterality: Right;  . US ECHOCARDIOGRAPHY  11/10/2006   EF 55-60%  . US ECHOCARDIOGRAPHY  09/20/2002   EF 65-70%   Social History   Social History  . Marital status: Married    Spouse name: N/A  . Number of children: 3  . Years of education: N/A   Occupational History  . 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 Smoking status: Never Smoker  . Smokeless tobacco: Never Used  . Alcohol use 4.2 oz/week    7 Shots of liquor per week     Comment: 2 drinks most days  . Drug use: No  . Sexual activity: No   Other Topics Concern  . 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 Hypertension Mother   .  Arthritis Mother   . Diabetes Mother   . Hypertension Father   . Stroke Father   . Kidney disease Father   . Hypertension Brother   . Diabetes Brother     Past medical history, social, surgical and family history all reviewed in electronic medical record.  No pertanent information unless stated regarding to the chief complaint.   Review of Systems:Review of systems updated and as accurate as of 05/26/16  No headache, visual changes, nausea, vomiting, diarrhea, constipation, dizziness, abdominal pain, skin rash, fevers, chills, night sweats, weight loss, swollen lymph nodes, body aches, joint swelling, muscle aches, chest pain, shortness of breath, mood changes.   Objective  Blood pressure 132/80, pulse 65, height 6' 1.5" (1.867 m), weight 182 lb (82.6 kg), SpO2 96 %. Systems examined below as of 05/26/16   General: No apparent distress alert and oriented x3 mood and affect normal, dressed appropriately.  HEENT: Pupils equal,  extraocular movements intact  Respiratory: Patient's speak in full sentences and does not appear short of breath  Cardiovascular: No lower extremity edema, non tender, no erythema  Skin: Warm dry intact with no signs of infection or rash on extremities or on axial skeleton.  Abdomen: Soft nontender  Neuro: Cranial nerves II through XII are intact, neurovascularly intact in all extremities with 2+ DTRs and 2+ pulses.  Lymph: No lymphadenopathy of posterior or anterior cervical chain or axillae bilaterally.  Gait normal with good balance and coordination.  MSK:  Non tender with full range of motion and good stability and symmetric strength and tone of shoulders, elbows, wrist, hip, knee and ankles bilaterally. Arthritic changes of multiple joints.  Hand exam shows the patient does have mild atrophy of the right thenar eminence compared to contralateral side. Significant arthritic changes of the hands bilaterally. Positive grind test on the right thumb. Tender to palpation over the [facility] joint. No crepitus noted except for with the grinding test. Neurovascularly intact distally. Grip strength 4 out of 5 but seems symmetric to the contralateral side.    Limited muscular skeletal ultrasound was performed and interpreted by  Lyndal Pulley  Limited ultrasound shows the patient does have severe arthritic changes of the [facility] joint. No true fracture appreciated. No capsulitis noted.  Impression: CMC arthritis  Impression and Recommendations:     This case required medical decision making of moderate complexity.      Note: This dictation was prepared with Dragon dictation along with smaller phrase technology. Any transcriptional errors that result from this process are unintentional.

## 2016-06-22 NOTE — Progress Notes (Signed)
[facility] [address], [place] [address] Phone: [phone] Subjective:     CC: right hand pain f/u  [id number]  Stephen Wilkinson is a 83 y.o. male coming in with complaint of Right hand pain. Patient has [place] arthritis. Patient was doing conservative therapy.  Worse with pain since last exam. Patient states it is starting to affect daily activities. Patient states that it is severe amount pain but any pressure on it. Has noticed some weakness as well.      Past Medical History:  Diagnosis Date  . Acute blood loss anemia 05/15/2011   post surgical  . Altered mental status 05/15/2011  . Anxiety   . Blood transfusion without reported diagnosis   . Cancer ([place])    basal and squamous skin carcinoma   . Cataract   . Chronic kidney disease    hx of uti due to catheter hx of prostatitis, BPH -   . Depression   . Diabetes mellitus   . Glaucoma 01/04/2013  . History of nuclear stress test    Myoview 3/17: EF 62%, no ischemia or scar, low risk  . Hx of transient ischemic attack (TIA) 01/04/2013  . Hx: UTI (urinary tract infection)   . Hyperlipidemia   . Hypertension   . Hypokalemia 05/15/2011  . Hypothyroidism   . Insomnia   . Macular degeneration   . OA (osteoarthritis)   . Pneumonia   . PVC's (premature ventricular contractions)   . Stroke ([place])    mid 1990s    Past Surgical History:  Procedure Laterality Date  . APPENDECTOMY    . INGUINAL HERNIA REPAIR    . OTHER SURGICAL HISTORY     microwave procedure for prostate - 09/12   . PROSTATE ABLATION     microwave  . REPLACEMENT TOTAL KNEE    . THYROIDECTOMY    . TONSILLECTOMY AND ADENOIDECTOMY    . TOTAL KNEE ARTHROPLASTY  05/11/2011   Procedure: TOTAL KNEE ARTHROPLASTY;  Surgeon: Gearlean Alf;  Location: [facility];  Service: Orthopedics;  Laterality: Right;  . US ECHOCARDIOGRAPHY  11/10/2006   EF 55-60%  . US ECHOCARDIOGRAPHY  09/20/2002   EF 65-70%   Social History   Social  History  . Marital status: Married    Spouse name: N/A  . Number of children: 3  . Years of education: N/A   Occupational History  . 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 Smoking status: Never Smoker  . Smokeless tobacco: Never Used  . Alcohol use 4.2 oz/week    7 Shots of liquor per week     Comment: 2 drinks most days  . Drug use: No  . Sexual activity: No   Other Topics Concern  . 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 Hypertension Mother   . Arthritis Mother   . Diabetes Mother   . Hypertension Father   . Stroke Father   . Kidney disease Father   . Hypertension Brother   . Diabetes Brother  Past medical history, social, surgical and family history all reviewed in electronic medical record.  No pertanent information unless stated regarding to the chief complaint.   Review of Systems: No headache, visual changes, nausea, vomiting, diarrhea, constipation, dizziness, abdominal pain, skin rash, fevers, chills, night sweats, weight loss, swollen lymph nodes, body aches, joint swelling, muscle aches, chest pain, shortness of breath, mood changes.    Objective  Blood pressure 140/82, pulse 67, height 6' 1.5" (1.867 m), weight 185 lb (83.9 kg), SpO2 96 %.   Systems examined below as of 06/23/16 General: NAD A&O x3 mood, affect normal  HEENT: Pupils equal, extraocular movements intact no nystagmus Respiratory: not short of breath at rest or with speaking Cardiovascular: No lower extremity edema, non tender Skin: Warm dry intact with no signs of infection or rash on extremities or on axial skeleton. Abdomen: Soft nontender, no masses Neuro: Cranial nerves  intact,  neurovascularly intact in all extremities with 2+ DTRs and 2+ pulses. Lymph: No lymphadenopathy appreciated today  Gait normal with good balance and coordination. .    MSK:  Changes of multiple joints. Hand exam shows the patient does have mild atrophy of the right thenar eminence compared to contralateral side.positive grind test. Tenderness over the [facility] joint.     Procedure: Real-time Ultrasound Guided Injection of right CMC joint Device: GE Logiq E  Ultrasound guided injection is preferred based studies that show increased duration, increased effect, greater accuracy, decreased procedural pain, increased response rate, and decreased cost with ultrasound guided versus blind injection.  Verbal informed consent obtained.  Time-out conducted.  Noted no overlying erythema, induration, or other signs of local infection.  Skin prepped in a sterile fashion.  Local anesthesia: Topical Ethyl chloride.  With sterile technique and under real time ultrasound guidance:  With a 25-gauge half-inch needle was injected with a total of 0.5 mL of 0.5% Marcaine and 0.5 mL of Kenalog 40 mg/dL. Completed without difficulty  Pain immediately resolved suggesting accurate placement of the medication.  Advised to call if fevers/chills, erythema, induration, drainage, or persistent bleeding.  Images permanently stored and available for review in the ultrasound unit.  Impression: Technically successful ultrasound guided injection. Impression and Recommendations:     This case required medical decision making of moderate complexity.      Note: This dictation was prepared with Dragon dictation along with smaller phrase technology. Any transcriptional errors that result from this process are unintentional.

## 2016-06-23 ENCOUNTER — Ambulatory Visit (INDEPENDENT_AMBULATORY_CARE_PROVIDER_SITE_OTHER): Payer: Medicare Other | Admitting: Family Medicine

## 2016-06-23 ENCOUNTER — Encounter: Payer: Self-pay | Admitting: Family Medicine

## 2016-06-23 ENCOUNTER — Ambulatory Visit: Payer: Self-pay

## 2016-06-23 VITALS — BP 140/82 | HR 67 | Ht 73.5 in | Wt 185.0 lb

## 2016-06-23 DIAGNOSIS — M1811 Unilateral primary osteoarthritis of first carpometacarpal joint, right hand: Secondary | ICD-10-CM

## 2016-06-23 NOTE — Assessment & Plan Note (Signed)
Worsening symptoms. Responded well to the injection. Declined any type of custom bracing. Discussed icing regimen and given topical anti-inflammatories. We discussed objective is to do in which ones to avoid. Follow-up again in 4-6 weeks.

## 2016-06-23 NOTE — Patient Instructions (Addendum)
Great to see you  We injected the [facility] arthritis.  Ice 20 minutes 2 times daily. Usually after activity and before bed. Continue the vitamin D Tylenol 500mg  could help as well up to 3 times a day  We can repeat injection every 3 months if needed See me when you need me.

## 2016-06-24 DIAGNOSIS — H35352 Cystoid macular degeneration, left eye: Secondary | ICD-10-CM | POA: Diagnosis not present

## 2016-06-24 DIAGNOSIS — H353211 Exudative age-related macular degeneration, right eye, with active choroidal neovascularization: Secondary | ICD-10-CM | POA: Diagnosis not present

## 2016-06-24 DIAGNOSIS — H353133 Nonexudative age-related macular degeneration, bilateral, advanced atrophic without subfoveal involvement: Secondary | ICD-10-CM | POA: Diagnosis not present

## 2016-06-24 DIAGNOSIS — H35372 Puckering of macula, left eye: Secondary | ICD-10-CM | POA: Diagnosis not present

## 2016-07-07 DIAGNOSIS — H2512 Age-related nuclear cataract, left eye: Secondary | ICD-10-CM | POA: Diagnosis not present

## 2016-07-16 DIAGNOSIS — H2512 Age-related nuclear cataract, left eye: Secondary | ICD-10-CM | POA: Diagnosis not present

## 2016-07-16 DIAGNOSIS — H25812 Combined forms of age-related cataract, left eye: Secondary | ICD-10-CM | POA: Diagnosis not present

## 2016-08-05 DIAGNOSIS — H353211 Exudative age-related macular degeneration, right eye, with active choroidal neovascularization: Secondary | ICD-10-CM | POA: Diagnosis not present

## 2016-08-17 ENCOUNTER — Telehealth: Payer: Self-pay | Admitting: Internal Medicine

## 2016-08-17 NOTE — Telephone Encounter (Signed)
Spoke with patient regarding awv. Patient would like a call back at the end of March to schedule awv.

## 2016-08-19 ENCOUNTER — Encounter: Payer: Self-pay | Admitting: Podiatry

## 2016-08-19 ENCOUNTER — Ambulatory Visit (INDEPENDENT_AMBULATORY_CARE_PROVIDER_SITE_OTHER): Payer: Medicare Other | Admitting: Podiatry

## 2016-08-19 DIAGNOSIS — B351 Tinea unguium: Secondary | ICD-10-CM | POA: Diagnosis not present

## 2016-08-19 DIAGNOSIS — E1140 Type 2 diabetes mellitus with diabetic neuropathy, unspecified: Secondary | ICD-10-CM

## 2016-08-19 DIAGNOSIS — M79676 Pain in unspecified toe(s): Secondary | ICD-10-CM | POA: Diagnosis not present

## 2016-08-19 NOTE — Progress Notes (Signed)
Patient ID: Stephen Wilkinson, male   DOB: August 10, 1928, 83 y.o.   MRN: [id number] Complaint:  Visit Type: Patient returns to my office for continued preventative foot care services. Complaint: Patient states" my nails have grown long and thick and become painful to walk and wear shoes" Patient has been diagnosed with DM with no foot complications. The patient presents for preventative foot care services. No changes to ROS.  He has macular degeneration with history of mother and grandmother losing their legs to diabetes.  Podiatric Exam: Vascular: dorsalis pedis and posterior tibial pulses are palpable bilateral. Capillary return is immediate. Temperature gradient is WNL. Skin turgor WNL  Sensorium: Normal Semmes Weinstein monofilament test. Normal tactile sensation bilaterally. Nail Exam: Pt has thick disfigured discolored nails with subungual debris noted bilateral entire nail hallux through fifth toenails Ulcer Exam: There is no evidence of ulcer or pre-ulcerative changes or infection. Orthopedic Exam: Muscle tone and strength are WNL. No limitations in general ROM. No crepitus or effusions noted. Foot type and digits show no abnormalities. Bony prominences are unremarkable. HAV B/L. Skin: . No infection or ulcers.Asymptomatic porokeratosis sub 5th right.  Diagnosis:  Onychomycosis, , Pain in right toe, pain in left toes,  Treatment & Plan Procedures and Treatment: Consent by patient was obtained for treatment procedures. The patient understood the discussion of treatment and procedures well. All questions were answered thoroughly reviewed. Debridement of mycotic and hypertrophic toenails, 1 through 5 bilateral and clearing of subungual debris. No ulceration, no infection noted.  Return Visit-Office Procedure: Patient instructed to return to the office for a follow up visit 10 weeks  for continued evaluation and treatment.  Gardiner Barefoot DPM

## 2016-08-25 NOTE — Progress Notes (Signed)
HPI: Follow-up hypertension and palpitations. Also history of TIA. Echo 2008 showed normal LV function; grade 1 DD; mild LVH; trace AI; mild MR and TR. Nuclear study March 2017 showed ejection fraction 62% and no ischemia. Since last seen, patient denies dyspnea, chest pain, palpitations or syncope.  Current Outpatient Prescriptions  Medication Sig Dispense Refill  . ALPRAZolam (XANAX) 0.25 MG tablet Take 1 tablet (0.25 mg total) by mouth 2 (two) times daily. 180 tablet 1  . amLODipine (NORVASC) 5 MG tablet Take 1 tablet (5 mg total) by mouth every morning. 90 tablet 3  . aspirin 81 MG tablet Take 81 mg by mouth daily.    . Bevacizumab (AVASTIN IV) Place 1 drop into the right eye as directed. Every 6 weeks    . clobetasol cream (TEMOVATE) 6.55 % Apply 1 application topically daily as needed (for contact dermatitis). Contact dermatitis  3  . clopidogrel (PLAVIX) 75 MG tablet Take 1 tablet (75 mg total) by mouth daily. 90 tablet 3  . famotidine (PEPCID) 20 MG tablet Take 1 tablet (20 mg total) by mouth 2 (two) times daily. 180 tablet 3  . glucose blood (ONE TOUCH ULTRA TEST) test strip Use as directed once daily to check blood sugar.  Diagnosis code E11.42 100 each 11  . latanoprost (XALATAN) 0.005 % ophthalmic solution Place 1 drop into both eyes at bedtime.     Marland Kitchen levothyroxine (SYNTHROID, LEVOTHROID) 100 MCG tablet Take 11mcg by mouth on Sunday, Tuesday, Thursday, and Saturday. Take 1.5tablets on Monday Wednesday, and Friday 115 tablet 3  . liothyronine (CYTOMEL) 25 MCG tablet Take 1 tablet (25 mcg total) by mouth every morning. Except for sunday 90 tablet 3  . metFORMIN (GLUCOPHAGE) 1000 MG tablet Take 1 tablet (1,000 mg total) by mouth daily. 90 tablet 3  . metoprolol succinate (TOPROL-XL) 25 MG 24 hr tablet Take 1 tablet (25 mg total) by mouth every morning. 90 tablet 3  . multivitamin-lutein (OCUVITE-LUTEIN) CAPS Take 1 capsule by mouth daily.    . sildenafil (VIAGRA) 100 MG tablet  Take 0.5-1 tablets (50-100 mg total) by mouth daily as needed for erectile dysfunction. 8 tablet 11   No current facility-administered medications for this visit.      Past Medical History:  Diagnosis Date  . Acute blood loss anemia 05/15/2011   post surgical  . Altered mental status 05/15/2011  . Anxiety   . Blood transfusion without reported diagnosis   . Cancer ([place])    basal and squamous skin carcinoma   . Cataract   . Chronic kidney disease    hx of uti due to catheter hx of prostatitis, BPH -   . Depression   . Diabetes mellitus   . Glaucoma 01/04/2013  . History of nuclear stress test    Myoview 3/17: EF 62%, no ischemia or scar, low risk  . Hx of transient ischemic attack (TIA) 01/04/2013  . Hx: UTI (urinary tract infection)   . Hyperlipidemia   . Hypertension   . Hypokalemia 05/15/2011  . Hypothyroidism   . Insomnia   . Macular degeneration   . OA (osteoarthritis)   . Pneumonia   . PVC's (premature ventricular contractions)   . Stroke ([place])    mid 1990s     Past Surgical History:  Procedure Laterality Date  . APPENDECTOMY    . INGUINAL HERNIA REPAIR    . OTHER SURGICAL HISTORY     microwave procedure for prostate - 09/12   .  PROSTATE ABLATION     microwave  . REPLACEMENT TOTAL KNEE    . THYROIDECTOMY    . TONSILLECTOMY AND ADENOIDECTOMY    . TOTAL KNEE ARTHROPLASTY  05/11/2011   Procedure: TOTAL KNEE ARTHROPLASTY;  Surgeon: Gearlean Alf;  Location: [facility];  Service: Orthopedics;  Laterality: Right;  . US ECHOCARDIOGRAPHY  11/10/2006   EF 55-60%  . US ECHOCARDIOGRAPHY  09/20/2002   EF 65-70%    Social History   Social History  . Marital status: Married    Spouse name: N/A  . Number of children: 3  . Years of education: N/A   Occupational History  . 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 Smoking status: Never Smoker  . Smokeless tobacco: Never Used  . Alcohol use 4.2 oz/week    7 Shots of liquor per  week     Comment: 2 drinks most days  . Drug use: No  . Sexual activity: No   Other Topics Concern  . 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 Hypertension Mother   . Arthritis Mother   . Diabetes Mother   . Hypertension Father   . Stroke Father   . Kidney disease Father   . Hypertension Brother   . Diabetes Brother     ROS: no fevers or chills, productive cough, hemoptysis, dysphasia, odynophagia, melena, hematochezia, dysuria, hematuria, rash, seizure activity, orthopnea, PND, pedal edema, claudication. Remaining systems are negative.  Physical Exam: Well-developed well-nourished in no acute distress.  Skin is warm and dry.  HEENT is normal.  Neck is supple. No bruits Chest is clear to auscultation with normal expansion.  Cardiovascular exam is regular rate and rhythm.  Abdominal exam nontender or distended. No masses palpated. Extremities show no edema. neuro grossly intact  ECG- Sinus rhythm at a rate of 62. Cannot rule out prior septal infarct. personally reviewed  A/P  1 Hypertension-blood pressure is borderline. Continue present medications and follow.  2 hyperlipidemia-managed by primary care.  3 TIA-continue aspirin and Plavix.  Kirk Ruths, MD

## 2016-08-27 ENCOUNTER — Telehealth: Payer: Self-pay | Admitting: *Deleted

## 2016-08-27 ENCOUNTER — Encounter: Payer: Self-pay | Admitting: Cardiology

## 2016-08-27 NOTE — Telephone Encounter (Signed)
Patient is needing the okay to hold plavix 5 days prior to injection. He has a follow up appointment 09-07-16 and the injection was scheduled for 09-08-16. Will forward for dr Stanford Breed review

## 2016-08-27 NOTE — Telephone Encounter (Signed)
Ok to hold plavix 5 d prior to procedure and resume after Stephen Wilkinson

## 2016-08-28 NOTE — Telephone Encounter (Signed)
Clearance routed via EPIC to fax # provided in telephone note

## 2016-09-07 ENCOUNTER — Ambulatory Visit (INDEPENDENT_AMBULATORY_CARE_PROVIDER_SITE_OTHER): Payer: Medicare Other | Admitting: Cardiology

## 2016-09-07 ENCOUNTER — Encounter: Payer: Self-pay | Admitting: Cardiology

## 2016-09-07 VITALS — BP 142/74 | HR 62 | Ht 73.5 in | Wt 181.0 lb

## 2016-09-07 DIAGNOSIS — E7800 Pure hypercholesterolemia, unspecified: Secondary | ICD-10-CM | POA: Diagnosis not present

## 2016-09-07 DIAGNOSIS — I119 Hypertensive heart disease without heart failure: Secondary | ICD-10-CM | POA: Diagnosis not present

## 2016-09-07 DIAGNOSIS — R002 Palpitations: Secondary | ICD-10-CM

## 2016-09-07 DIAGNOSIS — I10 Essential (primary) hypertension: Secondary | ICD-10-CM

## 2016-09-07 DIAGNOSIS — Z8673 Personal history of transient ischemic attack (TIA), and cerebral infarction without residual deficits: Secondary | ICD-10-CM | POA: Diagnosis not present

## 2016-09-07 MED ORDER — METOPROLOL SUCCINATE ER 50 MG PO TB24: 50.0000 mg | ORAL_TABLET | ORAL | 3 refills | Status: DC

## 2016-09-07 NOTE — Patient Instructions (Addendum)
Your physician wants you to follow-up in: 6 months with dr crenshaw You will receive a reminder letter in the mail two months in advance. If you Kareen't receive a letter, please call our office to schedule the follow-up appointment.  

## 2016-09-08 DIAGNOSIS — M5136 Other intervertebral disc degeneration, lumbar region: Secondary | ICD-10-CM | POA: Diagnosis not present

## 2016-09-16 DIAGNOSIS — H43822 Vitreomacular adhesion, left eye: Secondary | ICD-10-CM | POA: Diagnosis not present

## 2016-09-16 DIAGNOSIS — H353211 Exudative age-related macular degeneration, right eye, with active choroidal neovascularization: Secondary | ICD-10-CM | POA: Diagnosis not present

## 2016-09-16 DIAGNOSIS — H353133 Nonexudative age-related macular degeneration, bilateral, advanced atrophic without subfoveal involvement: Secondary | ICD-10-CM | POA: Diagnosis not present

## 2016-09-23 ENCOUNTER — Encounter: Payer: Self-pay | Admitting: Internal Medicine

## 2016-09-23 ENCOUNTER — Other Ambulatory Visit: Payer: Self-pay | Admitting: Internal Medicine

## 2016-09-23 ENCOUNTER — Ambulatory Visit (INDEPENDENT_AMBULATORY_CARE_PROVIDER_SITE_OTHER): Payer: Medicare Other | Admitting: Internal Medicine

## 2016-09-23 ENCOUNTER — Other Ambulatory Visit (INDEPENDENT_AMBULATORY_CARE_PROVIDER_SITE_OTHER): Payer: Medicare Other

## 2016-09-23 ENCOUNTER — Telehealth: Payer: Self-pay | Admitting: Internal Medicine

## 2016-09-23 VITALS — BP 122/90 | HR 63 | Temp 97.6°F | Ht 73.5 in | Wt 183.0 lb

## 2016-09-23 DIAGNOSIS — F329 Major depressive disorder, single episode, unspecified: Secondary | ICD-10-CM

## 2016-09-23 DIAGNOSIS — E538 Deficiency of other specified B group vitamins: Secondary | ICD-10-CM | POA: Insufficient documentation

## 2016-09-23 DIAGNOSIS — J3489 Other specified disorders of nose and nasal sinuses: Secondary | ICD-10-CM

## 2016-09-23 DIAGNOSIS — R252 Cramp and spasm: Secondary | ICD-10-CM | POA: Diagnosis not present

## 2016-09-23 DIAGNOSIS — F418 Other specified anxiety disorders: Secondary | ICD-10-CM

## 2016-09-23 DIAGNOSIS — F419 Anxiety disorder, unspecified: Principal | ICD-10-CM

## 2016-09-23 DIAGNOSIS — E039 Hypothyroidism, unspecified: Secondary | ICD-10-CM

## 2016-09-23 DIAGNOSIS — E7800 Pure hypercholesterolemia, unspecified: Secondary | ICD-10-CM | POA: Diagnosis not present

## 2016-09-23 DIAGNOSIS — E1142 Type 2 diabetes mellitus with diabetic polyneuropathy: Secondary | ICD-10-CM | POA: Diagnosis not present

## 2016-09-23 DIAGNOSIS — E034 Atrophy of thyroid (acquired): Secondary | ICD-10-CM

## 2016-09-23 HISTORY — DX: Deficiency of other specified B group vitamins: E53.8

## 2016-09-23 LAB — URINALYSIS, ROUTINE W REFLEX MICROSCOPIC
BILIRUBIN URINE: NEGATIVE
Hgb urine dipstick: NEGATIVE
KETONES UR: NEGATIVE
Leukocytes, UA: NEGATIVE
Nitrite: NEGATIVE
Specific Gravity, Urine: 1.02 (ref 1.000–1.030)
TOTAL PROTEIN, URINE-UPE24: NEGATIVE
URINE GLUCOSE: NEGATIVE
UROBILINOGEN UA: 0.2 (ref 0.0–1.0)
pH: 5.5 (ref 5.0–8.0)

## 2016-09-23 LAB — HEPATIC FUNCTION PANEL
ALBUMIN: 4.4 g/dL (ref 3.5–5.2)
ALK PHOS: 76 U/L (ref 39–117)
ALT: 13 U/L (ref 0–53)
AST: 17 U/L (ref 0–37)
BILIRUBIN DIRECT: 0.2 mg/dL (ref 0.0–0.3)
TOTAL PROTEIN: 7.4 g/dL (ref 6.0–8.3)
Total Bilirubin: 0.6 mg/dL (ref 0.2–1.2)

## 2016-09-23 LAB — CBC WITH DIFFERENTIAL/PLATELET
BASOS ABS: 0 10*3/uL (ref 0.0–0.1)
Basophils Relative: 0.4 % (ref 0.0–3.0)
EOS ABS: 0.1 10*3/uL (ref 0.0–0.7)
Eosinophils Relative: 1.6 % (ref 0.0–5.0)
HCT: 44.4 % (ref 39.0–52.0)
Hemoglobin: 15 g/dL (ref 13.0–17.0)
LYMPHS ABS: 1.4 10*3/uL (ref 0.7–4.0)
Lymphocytes Relative: 19.4 % (ref 12.0–46.0)
MCHC: 33.8 g/dL (ref 30.0–36.0)
MCV: 95.6 fl (ref 78.0–100.0)
MONO ABS: 0.6 10*3/uL (ref 0.1–1.0)
MONOS PCT: 8.6 % (ref 3.0–12.0)
NEUTROS ABS: 5.2 10*3/uL (ref 1.4–7.7)
NEUTROS PCT: 70 % (ref 43.0–77.0)
PLATELETS: 221 10*3/uL (ref 150.0–400.0)
RBC: 4.64 Mil/uL (ref 4.22–5.81)
RDW: 13.1 % (ref 11.5–15.5)
WBC: 7.4 10*3/uL (ref 4.0–10.5)

## 2016-09-23 LAB — LIPID PANEL
CHOLESTEROL: 182 mg/dL (ref 0–200)
HDL: 72.2 mg/dL (ref >39.00)
LDL Cholesterol: 99 mg/dL (ref 0–99)
NONHDL: 109.68
Total CHOL/HDL Ratio: 3
Triglycerides: 54 mg/dL (ref 0.0–149.0)
VLDL: 10.8 mg/dL (ref 0.0–40.0)

## 2016-09-23 LAB — BASIC METABOLIC PANEL
BUN: 18 mg/dL (ref 6–23)
CALCIUM: 9.8 mg/dL (ref 8.4–10.5)
CHLORIDE: 102 meq/L (ref 96–112)
CO2: 26 meq/L (ref 19–32)
Creatinine, Ser: 1.1 mg/dL (ref 0.40–1.50)
GFR: 67.43 mL/min (ref >60.00)
Glucose, Bld: 123 mg/dL — ABNORMAL HIGH (ref 70–99)
Potassium: 4.5 mEq/L (ref 3.5–5.1)
SODIUM: 139 meq/L (ref 135–145)

## 2016-09-23 LAB — MICROALBUMIN / CREATININE URINE RATIO
Creatinine,U: 108.4 mg/dL
MICROALB UR: 0.4 mg/dL (ref 0.0–1.9)
MICROALB/CREAT RATIO: 0.4 mg/g (ref 0.0–30.0)

## 2016-09-23 LAB — HEMOGLOBIN A1C: HEMOGLOBIN A1C: 6.7 % — AB (ref 4.6–6.5)

## 2016-09-23 LAB — VITAMIN B12: Vitamin B-12: >1500 pg/mL — ABNORMAL HIGH (ref 211–911)

## 2016-09-23 LAB — T4, FREE: Free T4: 0.96 ng/dL (ref 0.60–1.60)

## 2016-09-23 LAB — TSH: TSH: <0.01 u[IU]/mL — ABNORMAL LOW (ref 0.35–4.50)

## 2016-09-23 MED ORDER — TIZANIDINE HCL 4 MG PO TABS: 4.0000 mg | ORAL_TABLET | Freq: Four times a day (QID) | ORAL | 1 refills | Status: DC | PRN

## 2016-09-23 MED ORDER — MUPIROCIN CALCIUM 2 % NA OINT: 1.0000 "application " | TOPICAL_OINTMENT | Freq: Two times a day (BID) | NASAL | 0 refills | Status: DC

## 2016-09-23 MED ORDER — CITALOPRAM HYDROBROMIDE 10 MG PO TABS: 10.0000 mg | ORAL_TABLET | Freq: Every day | ORAL | 3 refills | Status: DC

## 2016-09-23 NOTE — Progress Notes (Signed)
Pre visit review using our clinic review tool, if applicable. No additional management support is needed unless otherwise documented below in the visit note. 

## 2016-09-23 NOTE — Telephone Encounter (Signed)
Pharmacy called stating the med  Is $2000, would like the generic sent  In instead   mupirocin nasal ointment (BACTROBAN)

## 2016-09-23 NOTE — Assessment & Plan Note (Signed)
stable overall by history and exam, recent data reviewed with pt, and pt to continue medical treatment as before,  to f/u any worsening symptoms or concerns Lab Results  Component Value Date   LDLCALC 96 03/24/2016   Declines any med change, goal < 70, cont med and diet

## 2016-09-23 NOTE — Patient Instructions (Signed)
Please take all new medication as prescribed  - the ointment antibiotic for the nose, as well as the muscle relaxer to take at bedtime (both given hardcopy)  Please take all new medication as prescribed - the celexa 10 mg per day for nerves, sent to [facility]  Please continue all other medications as before, and refills have been done if requested.  Please have the pharmacy call with any other refills you may need.  Please continue your efforts at being more active, low cholesterol diet, and weight control.  Please keep your appointments with your specialists as you may have planned  Please go to the LAB in the Basement (turn left off the elevator) for the tests to be done today  You will be contacted by phone if any changes need to be made immediately.  Otherwise, you will receive a letter about your results with an explanation, but please check with MyChart first.  Please remember to sign up for MyChart if you have not done so, as this will be important to you in the future with finding out test results, communicating by private email, and scheduling acute appointments online when needed.  If you have Medicare related insurance (such as traditional Medicare, Blue [employer] or [employer], or similar), Please make an appointment at the [facility] with Sharee Pimple, the [employer], for your Wellness Visit in this office, which is a benefit with your insurance.  Please return in 6 months, or sooner if needed, with Lab testing done 3-5 days before

## 2016-09-23 NOTE — Assessment & Plan Note (Signed)
Has been taking oral replacement, will recheck level

## 2016-09-23 NOTE — Assessment & Plan Note (Signed)
Ok for qhs muscle relaxer prn,  to f/u any worsening symptoms or concerns

## 2016-09-23 NOTE — Assessment & Plan Note (Signed)
Ok to add celexa 10 qd,  to f/u any worsening symptoms or concerns, delcines need for counseling referral

## 2016-09-23 NOTE — Telephone Encounter (Signed)
[place] for generic substitution - I think the pharmacy is allowed to do this, but let me know the name of the alternative if this does not work out

## 2016-09-23 NOTE — Assessment & Plan Note (Signed)
stable overall by history and exam, recent data reviewed with pt, and pt to continue medical treatment as before,  to f/u any worsening symptoms or concerns Lab Results  Component Value Date   HGBA1C 6.3 03/24/2016   For f/u lab

## 2016-09-23 NOTE — Progress Notes (Signed)
Subjective:    Patient ID: Stephen Wilkinson, male    DOB: 08/21/1928, 83 y.o.   MRN: [id number]  HPI  Here to f/u; overall doing ok,  Pt denies chest pain, increasing sob or doe, wheezing, orthopnea, PND, increased LE swelling, palpitations, dizziness or syncope.  Pt denies new neurological symptoms such as new headache, or facial or extremity weakness or numbness.  Pt denies polydipsia, polyuria, or low sugar episode.   Pt denies new neurological symptoms such as new headache, or facial or extremity weakness or numbness.   Pt states overall good compliance with meds, mostly trying to follow appropriate diet, with wt overall stable,  but little exercise however. Walks with cane.  Has ongtoing MSk problems.  Does have left nostril infection for 4 wks, hurtus, just not getting better, with swelling but no fever or drainage or blood.  Pt c/o recent LBP without change in severity, bowel or bladder change, fever, wt loss,  worsening LE pain/numbness/weakness, gait change or falls.  Has hx of sciatica , s/p ESI 2 yrs ago, improved with ESI per ortho, pain just not improving.  Plans to f/u with Dr Tamala Julian, may need repeat ESI arranged.   Also c/o cramping pains to bilat legs, at different times, mostly below the knee byt sometimes to thighs before. Better to walk around, worse at night, or with sitting for quite a while.  Can be very painful but always resolved.   Taking his oral b12, as well as VIt D per Dr Tamala Julian, wondering about needing thyroid test, Denies hyper or hypo thyroid symptoms such as voice, skin or hair change, but very nervous and wondering if these had anything to do with this.  Has taken xanax in past, worries all the time, ongoing for many years, tends to worry eve n with nothing to worry about.  Realizes this but cant stop.  Has tried double dose xanax but only made him sleepy.   Past Medical History:  Diagnosis Date  . Acute blood loss anemia 05/15/2011   post surgical  . Altered mental status  05/15/2011  . Anxiety   . Blood transfusion without reported diagnosis   . Cancer ([place])    basal and squamous skin carcinoma   . Cataract   . Chronic kidney disease    hx of uti due to catheter hx of prostatitis, BPH -   . Depression   . Diabetes mellitus   . Glaucoma 01/04/2013  . History of nuclear stress test    Myoview 3/17: EF 62%, no ischemia or scar, low risk  . Hx of transient ischemic attack (TIA) 01/04/2013  . Hx: UTI (urinary tract infection)   . Hyperlipidemia   . Hypertension   . Hypokalemia 05/15/2011  . Hypothyroidism   . Insomnia   . Macular degeneration   . OA (osteoarthritis)   . Pneumonia   . PVC's (premature ventricular contractions)   . Stroke ([place])    mid 1990s    Past Surgical History:  Procedure Laterality Date  . APPENDECTOMY    . INGUINAL HERNIA REPAIR    . OTHER SURGICAL HISTORY     microwave procedure for prostate - 09/12   . PROSTATE ABLATION     microwave  . REPLACEMENT TOTAL KNEE    . THYROIDECTOMY    . TONSILLECTOMY AND ADENOIDECTOMY    . TOTAL KNEE ARTHROPLASTY  05/11/2011   Procedure: TOTAL KNEE ARTHROPLASTY;  Surgeon: Gearlean Alf;  Location: [facility];  Service: Orthopedics;  Laterality: Right;  . US ECHOCARDIOGRAPHY  11/10/2006   EF 55-60%  . US ECHOCARDIOGRAPHY  09/20/2002   EF 65-70%    reports that he has never smoked. He has never used smokeless tobacco. He reports that he drinks about 4.2 oz of alcohol per week . He reports that he does not use drugs. family history includes Arthritis in his mother; Diabetes in his brother and mother; Hypertension in his brother, father, and mother; Kidney disease in his father; Stroke in his father. No Known Allergies Current Outpatient Prescriptions on File Prior to Visit  Medication Sig Dispense Refill  . ALPRAZolam (XANAX) 0.25 MG tablet Take 1 tablet (0.25 mg total) by mouth 2 (two) times daily. 180 tablet 1  . amLODipine (NORVASC) 5 MG tablet Take 1 tablet (5 mg total) by mouth every  morning. 90 tablet 3  . aspirin 81 MG tablet Take 81 mg by mouth daily.    . Bevacizumab (AVASTIN IV) Place 1 drop into the right eye as directed. Every 6 weeks    . clobetasol cream (TEMOVATE) 9.67 % Apply 1 application topically daily as needed (for contact dermatitis). Contact dermatitis  3  . clopidogrel (PLAVIX) 75 MG tablet Take 1 tablet (75 mg total) by mouth daily. 90 tablet 3  . famotidine (PEPCID) 20 MG tablet Take 1 tablet (20 mg total) by mouth 2 (two) times daily. 180 tablet 3  . glucose blood (ONE TOUCH ULTRA TEST) test strip Use as directed once daily to check blood sugar.  Diagnosis code E11.42 100 each 11  . latanoprost (XALATAN) 0.005 % ophthalmic solution Place 1 drop into both eyes at bedtime.     Marland Kitchen levothyroxine (SYNTHROID, LEVOTHROID) 100 MCG tablet Take 137mcg by mouth on Sunday, Tuesday, Thursday, and Saturday. Take 1.5tablets on Monday Wednesday, and Friday 115 tablet 3  . liothyronine (CYTOMEL) 25 MCG tablet Take 1 tablet (25 mcg total) by mouth every morning. Except for sunday 90 tablet 3  . metFORMIN (GLUCOPHAGE) 1000 MG tablet Take 1 tablet (1,000 mg total) by mouth daily. 90 tablet 3  . metoprolol succinate (TOPROL-XL) 25 MG 24 hr tablet Take 1 tablet (25 mg total) by mouth every morning.    . multivitamin-lutein (OCUVITE-LUTEIN) CAPS Take 1 capsule by mouth daily.    . sildenafil (VIAGRA) 100 MG tablet Take 0.5-1 tablets (50-100 mg total) by mouth daily as needed for erectile dysfunction. 8 tablet 11  . [DISCONTINUED] warfarin (COUMADIN) 3 MG tablet Take 1 tablet (3 mg total) by mouth one time only at 6 PM. 35 tablet 0   No current facility-administered medications on file prior to visit.    Review of Systems   Constitutional: Negative for other unusual diaphoresis or sweats HENT: Negative for ear discharge or swelling Eyes: Negative for other worsening visual disturbances Respiratory: Negative for stridor or other swelling  Gastrointestinal: Negative for  worsening distension or other blood Genitourinary: Negative for retention or other urinary change Musculoskeletal: Negative for other MSK pain or swelling Skin: Negative for color change or other new lesions Neurological: Negative for worsening tremors and other numbness  Psychiatric/Behavioral: Negative for worsening agitation or other fatigue All other system neg per pt    Objective:   Physical Exam BP 122/90   Pulse 63   Temp 97.6 F (36.4 C) (Oral)   Ht 6' 1.5" (1.867 m)   Wt 183 lb (83 kg)   SpO2 99%   BMI 23.82 kg/m  VS noted, normal wt, frail appearing, walks with  cane but bright, alert and memory intact Constitutional: Pt appears in NAD HENT: Head: NCAT.  Right Ear: External ear normal.  Left Ear: External ear normal.  Has mild bilat ear canal wax but not impacted Eyes: . Pupils are equal, round, and reactive to light. Conjunctivae and EOM are normal Nose: without d/c or deformity but tender trace swelling to left nares Neck: Neck supple. Gross normal ROM Cardiovascular: Normal rate and regular rhythm.   Pulmonary/Chest: Effort normal and breath sounds without rales or wheezing.  Abd:  Soft, NT, ND, + BS, no organomegaly Neurological: Pt is alert. At baseline orientation, motor grossly intact Skin: Skin is warm. No rashes, other new lesions, no LE edema Psychiatric: Pt behavior is normal without agitation but with 1-2+ anxiety No other exam findings Lab Results  Component Value Date   WBC 5.9 03/24/2016   HGB 14.8 03/24/2016   HCT 43.2 03/24/2016   PLT 291.0 03/24/2016   GLUCOSE 124 (H) 03/24/2016   CHOL 168 03/24/2016   TRIG 67.0 03/24/2016   HDL 58.10 03/24/2016   LDLCALC 96 03/24/2016   ALT 19 03/24/2016   AST 19 03/24/2016   NA 142 03/24/2016   K 4.3 03/24/2016   CL 104 03/24/2016   CREATININE 1.14 03/24/2016   BUN 14 03/24/2016   CO2 26 03/24/2016   TSH 0.04 (L) 03/24/2016   INR 2.16 (H) 05/15/2011   HGBA1C 6.3 03/24/2016   MICROALBUR <0.7  03/24/2016       Assessment & Plan:

## 2016-09-23 NOTE — Assessment & Plan Note (Signed)
May be overcontrolled, possibly contributing to anxiety, will recheck labs, may need decresaed cytomel

## 2016-09-23 NOTE — Assessment & Plan Note (Signed)
Mild, possible staph, for mupirocin asd,  to f/u any worsening symptoms or concerns

## 2016-09-24 ENCOUNTER — Telehealth: Payer: Self-pay | Admitting: *Deleted

## 2016-09-24 NOTE — Telephone Encounter (Signed)
-----   Message from Biagio Borg, MD sent at 09/23/2016  7:13 PM EDT ----- Left message on MyChart, pt to cont same tx except  The test results show that your current treatment is OK, except you appear to be taking too much medication for the thyroid.  Please STOP the Cytomel, but continue all other medication.  Please return for LAB only in 4 weeks to re-check the blood tests.    Stephen Wilkinson to please inform pt, I will cancel the rx off the list

## 2016-09-24 NOTE — Telephone Encounter (Signed)
Spoke with pt, he will stop talking Cytomel. He will come back in 4 weeks to have labs repeated.

## 2016-09-24 NOTE — Telephone Encounter (Signed)
Called [facility] spokr w/Heather gave verbal to change to 22 gm generic...Johny Chess

## 2016-09-24 NOTE — Telephone Encounter (Signed)
Rec'd fax stating mupirocin nasal ointment  is no longer available wanting to know can they change to 22gm tube...Stephen Wilkinson

## 2016-09-24 NOTE — Telephone Encounter (Signed)
Ok for verbal ,thanks 

## 2016-10-13 DIAGNOSIS — M4310 Spondylolisthesis, site unspecified: Secondary | ICD-10-CM | POA: Diagnosis not present

## 2016-10-13 DIAGNOSIS — M5136 Other intervertebral disc degeneration, lumbar region: Secondary | ICD-10-CM | POA: Diagnosis not present

## 2016-10-20 ENCOUNTER — Other Ambulatory Visit (INDEPENDENT_AMBULATORY_CARE_PROVIDER_SITE_OTHER): Payer: Medicare Other

## 2016-10-20 ENCOUNTER — Encounter: Payer: Self-pay | Admitting: Internal Medicine

## 2016-10-20 DIAGNOSIS — E039 Hypothyroidism, unspecified: Secondary | ICD-10-CM | POA: Diagnosis not present

## 2016-10-20 LAB — T4, FREE: FREE T4: 0.81 ng/dL (ref 0.60–1.60)

## 2016-10-20 LAB — TSH: TSH: 0.36 u[IU]/mL (ref 0.35–4.50)

## 2016-10-21 DIAGNOSIS — H353211 Exudative age-related macular degeneration, right eye, with active choroidal neovascularization: Secondary | ICD-10-CM | POA: Diagnosis not present

## 2016-10-27 DIAGNOSIS — M4686 Other specified inflammatory spondylopathies, lumbar region: Secondary | ICD-10-CM | POA: Diagnosis not present

## 2016-10-27 DIAGNOSIS — M4310 Spondylolisthesis, site unspecified: Secondary | ICD-10-CM | POA: Diagnosis not present

## 2016-10-27 DIAGNOSIS — M5136 Other intervertebral disc degeneration, lumbar region: Secondary | ICD-10-CM | POA: Diagnosis not present

## 2016-10-28 ENCOUNTER — Encounter: Payer: Self-pay | Admitting: Podiatry

## 2016-10-28 ENCOUNTER — Ambulatory Visit (INDEPENDENT_AMBULATORY_CARE_PROVIDER_SITE_OTHER): Payer: Medicare Other | Admitting: Podiatry

## 2016-10-28 DIAGNOSIS — E1140 Type 2 diabetes mellitus with diabetic neuropathy, unspecified: Secondary | ICD-10-CM

## 2016-10-28 DIAGNOSIS — M79676 Pain in unspecified toe(s): Secondary | ICD-10-CM

## 2016-10-28 DIAGNOSIS — B351 Tinea unguium: Secondary | ICD-10-CM

## 2016-10-28 NOTE — Progress Notes (Signed)
Patient ID: Stephen Wilkinson, male   DOB: [birth year], 83 y.o.   MRN: [id number] Complaint:  Visit Type: Patient returns to my office for continued preventative foot care services. Complaint: Patient states" my nails have grown long and thick and become painful to walk and wear shoes" Patient has been diagnosed with DM with no foot complications. The patient presents for preventative foot care services. No changes to ROS.  He has macular degeneration with history of mother and grandmother losing their legs to diabetes.  Podiatric Exam: Vascular: dorsalis pedis and posterior tibial pulses are palpable bilateral. Capillary return is immediate. Temperature gradient is WNL. Skin turgor WNL  Sensorium: Normal Semmes Weinstein monofilament test. Normal tactile sensation bilaterally. Nail Exam: Pt has thick disfigured discolored nails with subungual debris noted bilateral entire nail hallux through fifth toenails Ulcer Exam: There is no evidence of ulcer or pre-ulcerative changes or infection. Orthopedic Exam: Muscle tone and strength are WNL. No limitations in general ROM. No crepitus or effusions noted. Foot type and digits show no abnormalities. Bony prominences are unremarkable. HAV B/L. Skin: . No infection or ulcers.Asymptomatic porokeratosis sub 5th right.  Diagnosis:  Onychomycosis, , Pain in right toe, pain in left toes,  Treatment & Plan Procedures and Treatment: Consent by patient was obtained for treatment procedures. The patient understood the discussion of treatment and procedures well. All questions were answered thoroughly reviewed. Debridement of mycotic and hypertrophic toenails, 1 through 5 bilateral and clearing of subungual debris. No ulceration, no infection noted.  Return Visit-Office Procedure: Patient instructed to return to the office for a follow up visit 10 weeks  for continued evaluation and treatment.  Gardiner Barefoot DPM

## 2016-11-05 DIAGNOSIS — H353211 Exudative age-related macular degeneration, right eye, with active choroidal neovascularization: Secondary | ICD-10-CM | POA: Diagnosis not present

## 2016-11-05 DIAGNOSIS — H353133 Nonexudative age-related macular degeneration, bilateral, advanced atrophic without subfoveal involvement: Secondary | ICD-10-CM | POA: Diagnosis not present

## 2016-11-05 DIAGNOSIS — H43391 Other vitreous opacities, right eye: Secondary | ICD-10-CM | POA: Diagnosis not present

## 2016-11-05 DIAGNOSIS — H35051 Retinal neovascularization, unspecified, right eye: Secondary | ICD-10-CM | POA: Diagnosis not present

## 2016-11-06 DIAGNOSIS — M5136 Other intervertebral disc degeneration, lumbar region: Secondary | ICD-10-CM | POA: Diagnosis not present

## 2016-11-13 DIAGNOSIS — M4686 Other specified inflammatory spondylopathies, lumbar region: Secondary | ICD-10-CM | POA: Diagnosis not present

## 2016-11-13 DIAGNOSIS — M5136 Other intervertebral disc degeneration, lumbar region: Secondary | ICD-10-CM | POA: Diagnosis not present

## 2016-11-13 DIAGNOSIS — M4310 Spondylolisthesis, site unspecified: Secondary | ICD-10-CM | POA: Diagnosis not present

## 2016-11-24 ENCOUNTER — Ambulatory Visit (INDEPENDENT_AMBULATORY_CARE_PROVIDER_SITE_OTHER): Payer: Medicare Other | Admitting: Internal Medicine

## 2016-11-24 ENCOUNTER — Encounter: Payer: Self-pay | Admitting: Internal Medicine

## 2016-11-24 DIAGNOSIS — M48061 Spinal stenosis, lumbar region without neurogenic claudication: Secondary | ICD-10-CM | POA: Diagnosis not present

## 2016-11-24 DIAGNOSIS — E1142 Type 2 diabetes mellitus with diabetic polyneuropathy: Secondary | ICD-10-CM | POA: Diagnosis not present

## 2016-11-24 DIAGNOSIS — E034 Atrophy of thyroid (acquired): Secondary | ICD-10-CM

## 2016-11-24 DIAGNOSIS — N281 Cyst of kidney, acquired: Secondary | ICD-10-CM | POA: Insufficient documentation

## 2016-11-24 DIAGNOSIS — M5432 Sciatica, left side: Secondary | ICD-10-CM

## 2016-11-24 DIAGNOSIS — N2889 Other specified disorders of kidney and ureter: Secondary | ICD-10-CM

## 2016-11-24 MED ORDER — GABAPENTIN 100 MG PO CAPS: 100.0000 mg | ORAL_CAPSULE | Freq: Three times a day (TID) | ORAL | 5 refills | Status: DC

## 2016-11-24 NOTE — Assessment & Plan Note (Signed)
Mild to mod recurrent compared to mod to severe pain from lumbar stenosis, for tx as above

## 2016-11-24 NOTE — Assessment & Plan Note (Signed)
Likely small renal cyst, will also check abd u/s but if confirmed will not need further evaluation or tx

## 2016-11-24 NOTE — Progress Notes (Signed)
Subjective:    Patient ID: Stephen Wilkinson, male    DOB: 27-Nov-1928, 83 y.o.   MRN: [id number]  HPI  Here to fu after recent ortho/Dr Rolena Infante visit which determined spinal stenosis, was seen by PA who tried to dissuade him from surgury but pt persisted in asking for tx, so is being scheduled for Lumbar decompression l3-s1; pt brings MRI with him, and due to this experiience and was sort of offended his was being considered to be too old, cannot just "all of a sudden Im a cripple"; is asking for referral for second opinion, and also is concerned about other abnormalities found on recent MRI and how it might affect his surgical clearance.  Had last MRI about 3 yrs ago, then more recent to compare.  Specifically he is asking about a renal cyst 9 mm inferomed aspect of right kidney, no change from previous.  Currently pain is persistent chronic, mod to occasionaly severe, located midline to slightly left, with radiation to the left buttock, but to the left knee with more severe flares of pain.  Has fallen x 1 since pain onset, that he states really was more due to lower vision with macular degeneration, with a trip and fall, no apparent injury that time Pain is better with walking shorter distances, and lumbar corset helps.  He is looking for pain relief, not "cure" .  Pt states per Dr Rolena Infante he also needs clearance from Cardiology/ Dr Stanford Breed.  Needs me to sign off on the ok related to the right renal cortical cyst.  Current pain med is None.  Takes chronic xanax for nerves.  Also on plavix  Denies hyper or hypo thyroid symptoms such as voice, skin or hair change.Stil drives, eyesight is corrected to 20.40.  Last surgury was bilat knees (1 year apart) 2015 and 2016.   Has had PT evaluation Past Medical History:  Diagnosis Date  . Acute blood loss anemia 05/15/2011   post surgical  . Altered mental status 05/15/2011  . Anxiety   . B12 deficiency 09/23/2016  . Blood transfusion without reported diagnosis   .  Cancer ([place])    basal and squamous skin carcinoma   . Cataract   . Chronic kidney disease    hx of uti due to catheter hx of prostatitis, BPH -   . Depression   . Diabetes mellitus   . Glaucoma 01/04/2013  . History of nuclear stress test    Myoview 3/17: EF 62%, no ischemia or scar, low risk  . Hx of transient ischemic attack (TIA) 01/04/2013  . Hx: UTI (urinary tract infection)   . Hyperlipidemia   . Hypertension   . Hypokalemia 05/15/2011  . Hypothyroidism   . Insomnia   . Macular degeneration   . OA (osteoarthritis)   . Pneumonia   . PVC's (premature ventricular contractions)   . Stroke ([place])    mid 1990s    Past Surgical History:  Procedure Laterality Date  . APPENDECTOMY    . INGUINAL HERNIA REPAIR    . OTHER SURGICAL HISTORY     microwave procedure for prostate - 09/12   . PROSTATE ABLATION     microwave  . REPLACEMENT TOTAL KNEE    . THYROIDECTOMY    . TONSILLECTOMY AND ADENOIDECTOMY    . TOTAL KNEE ARTHROPLASTY  05/11/2011   Procedure: TOTAL KNEE ARTHROPLASTY;  Surgeon: Gearlean Alf;  Location: [facility];  Service: Orthopedics;  Laterality: Right;  . US ECHOCARDIOGRAPHY  11/10/2006  EF 55-60%  . US ECHOCARDIOGRAPHY  09/20/2002   EF 65-70%    reports that he has never smoked. He has never used smokeless tobacco. He reports that he drinks about 4.2 oz of alcohol per week . He reports that he does not use drugs. family history includes Arthritis in his mother; Diabetes in his brother and mother; Hypertension in his brother, father, and mother; Kidney disease in his father; Stroke in his father. No Known Allergies Current Outpatient Prescriptions on File Prior to Visit  Medication Sig Dispense Refill  . ALPRAZolam (XANAX) 0.25 MG tablet Take 1 tablet (0.25 mg total) by mouth 2 (two) times daily. 180 tablet 1  . amLODipine (NORVASC) 5 MG tablet Take 1 tablet (5 mg total) by mouth every morning. 90 tablet 3  . aspirin 81 MG tablet Take 81 mg by mouth daily.    .  Bevacizumab (AVASTIN IV) Place 1 drop into the right eye as directed. Every 6 weeks    . citalopram (CELEXA) 10 MG tablet Take 1 tablet (10 mg total) by mouth daily. 90 tablet 3  . clobetasol cream (TEMOVATE) 3.50 % Apply 1 application topically daily as needed (for contact dermatitis). Contact dermatitis  3  . clopidogrel (PLAVIX) 75 MG tablet Take 1 tablet (75 mg total) by mouth daily. 90 tablet 3  . famotidine (PEPCID) 20 MG tablet Take 1 tablet (20 mg total) by mouth 2 (two) times daily. 180 tablet 3  . glucose blood (ONE TOUCH ULTRA TEST) test strip Use as directed once daily to check blood sugar.  Diagnosis code E11.42 100 each 11  . latanoprost (XALATAN) 0.005 % ophthalmic solution Place 1 drop into both eyes at bedtime.     Marland Kitchen levothyroxine (SYNTHROID, LEVOTHROID) 100 MCG tablet Take 171mcg by mouth on Sunday, Tuesday, Thursday, and Saturday. Take 1.5tablets on Monday Wednesday, and Friday 115 tablet 3  . metFORMIN (GLUCOPHAGE) 1000 MG tablet Take 1 tablet (1,000 mg total) by mouth daily. 90 tablet 3  . metoprolol succinate (TOPROL-XL) 25 MG 24 hr tablet Take 1 tablet (25 mg total) by mouth every morning.    . multivitamin-lutein (OCUVITE-LUTEIN) CAPS Take 1 capsule by mouth daily.    . mupirocin nasal ointment (BACTROBAN) 2 % Place 1 application into the nose 2 (two) times daily. Use one-half of tube in each nostril twice daily for five (5) days. After application, press sides of nose together and gently massage. 10 g 0  . sildenafil (VIAGRA) 100 MG tablet Take 0.5-1 tablets (50-100 mg total) by mouth daily as needed for erectile dysfunction. 8 tablet 11  . tiZANidine (ZANAFLEX) 4 MG tablet Take 1 tablet (4 mg total) by mouth every 6 (six) hours as needed for muscle spasms. 600 tablet 1  . [DISCONTINUED] warfarin (COUMADIN) 3 MG tablet Take 1 tablet (3 mg total) by mouth one time only at 6 PM. 35 tablet 0   No current facility-administered medications on file prior to visit.    Review of  Systems  Constitutional: Negative for other unusual diaphoresis or sweats HENT: Negative for ear discharge or swelling Eyes: Negative for other worsening visual disturbances Respiratory: Negative for stridor or other swelling  Gastrointestinal: Negative for worsening distension or other blood Genitourinary: Negative for retention or other urinary change Musculoskeletal: Negative for other MSK pain or swelling Skin: Negative for color change or other new lesions Neurological: Negative for worsening tremors and other numbness  Psychiatric/Behavioral: Negative for worsening agitation or other fatigue All other system neg  per pt    Objective:   Physical Exam BP (!) 142/88   Pulse 70   Ht 6\' 1"  (1.854 m)   Wt 186 lb (84.4 kg)   SpO2 99%   BMI 24.54 kg/m  VS noted, not ill appearing Constitutional: Pt appears in NAD HENT: Head: NCAT.  Right Ear: External ear normal.  Left Ear: External ear normal.  Eyes: . Pupils are equal, round, and reactive to light. Conjunctivae and EOM are normal Nose: without d/c or deformity Neck: Neck supple. Gross normal ROM Cardiovascular: Normal rate and regular rhythm.   Pulmonary/Chest: Effort normal and breath sounds without rales or wheezing.  Abd:  Soft, NT, ND, + BS, no organomegaly Neurological: Pt is alert. At baseline orientation, motor grossly intact Skin: Skin is warm. No rashes, other new lesions, no LE edema Psychiatric: Pt behavior is normal without agitation , 2+ nervous and verbose No other exam findings Lab Results  Component Value Date   WBC 7.4 09/23/2016   HGB 15.0 09/23/2016   HCT 44.4 09/23/2016   PLT 221.0 09/23/2016   GLUCOSE 123 (H) 09/23/2016   CHOL 182 09/23/2016   TRIG 54.0 09/23/2016   HDL 72.20 09/23/2016   LDLCALC 99 09/23/2016   ALT 13 09/23/2016   AST 17 09/23/2016   NA 139 09/23/2016   K 4.5 09/23/2016   CL 102 09/23/2016   CREATININE 1.10 09/23/2016   BUN 18 09/23/2016   CO2 26 09/23/2016   TSH 0.36  10/20/2016   INR 2.16 (H) 05/15/2011   HGBA1C 6.7 (H) 09/23/2016   MICROALBUR 0.4 09/23/2016       Assessment & Plan:

## 2016-11-24 NOTE — Assessment & Plan Note (Signed)
stable overall by history and exam, recent data reviewed with pt, and pt to continue medical treatment as before,  to f/u any worsening symptoms or concerns Lab Results  Component Value Date   HGBA1C 6.7 (H) 09/23/2016

## 2016-11-24 NOTE — Assessment & Plan Note (Signed)
Pt requests second opinion, will ask Dr Trenton Gammon to see, has had MRI and PT, to cont lumbar corset, add gabapentin 100 tid, and tylenol ES 1 every 8 hrs

## 2016-11-24 NOTE — Assessment & Plan Note (Signed)
stable overall by history and exam, recent data reviewed with pt, and pt to continue medical treatment as before,  to f/u any worsening symptoms or concerns Lab Results  Component Value Date   TSH 0.36 10/20/2016   

## 2016-11-24 NOTE — Patient Instructions (Addendum)
Please take all new medication as prescribed - the gabapentin at 100 mg three times per day  You can also take Tylenol ES - 1 every 8 hrs for pain  Please continue all other medications as before, and refills have been done if requested.  Please have the pharmacy call with any other refills you may need.  Please keep your appointments with your specialists as you may have planned - Dr Stanford Breed for heart clearance (though you may wish to try get this moved up from the September appt you now have)  You will be contacted regarding the referral for: Abdomen ultrasound to make sure the mass is a cyst only  You will be contacted regarding the referral for: Dr Trenton Gammon, Neurosurgury

## 2016-11-24 NOTE — Addendum Note (Signed)
Addended by: Biagio Borg on: 11/24/2016 12:49 PM   Modules accepted: Orders

## 2016-11-26 NOTE — Addendum Note (Signed)
Addended by: Biagio Borg on: 11/26/2016 07:46 PM   Modules accepted: Orders

## 2016-11-30 ENCOUNTER — Ambulatory Visit
Admission: RE | Admit: 2016-11-30 | Discharge: 2016-11-30 | Disposition: A | Discharge status: Home / Self Care | Payer: Medicare Other | Source: Ambulatory Visit | Attending: Internal Medicine | Admitting: Internal Medicine

## 2016-11-30 DIAGNOSIS — N2889 Other specified disorders of kidney and ureter: Secondary | ICD-10-CM

## 2016-11-30 DIAGNOSIS — N281 Cyst of kidney, acquired: Secondary | ICD-10-CM | POA: Diagnosis not present

## 2016-12-01 ENCOUNTER — Encounter: Payer: Self-pay | Admitting: Internal Medicine

## 2016-12-03 DIAGNOSIS — H353124 Nonexudative age-related macular degeneration, left eye, advanced atrophic with subfoveal involvement: Secondary | ICD-10-CM | POA: Diagnosis not present

## 2016-12-03 DIAGNOSIS — H43822 Vitreomacular adhesion, left eye: Secondary | ICD-10-CM | POA: Diagnosis not present

## 2016-12-03 DIAGNOSIS — H353211 Exudative age-related macular degeneration, right eye, with active choroidal neovascularization: Secondary | ICD-10-CM | POA: Diagnosis not present

## 2016-12-03 DIAGNOSIS — H353113 Nonexudative age-related macular degeneration, right eye, advanced atrophic without subfoveal involvement: Secondary | ICD-10-CM | POA: Diagnosis not present

## 2016-12-03 DIAGNOSIS — H35372 Puckering of macula, left eye: Secondary | ICD-10-CM | POA: Diagnosis not present

## 2017-01-06 ENCOUNTER — Ambulatory Visit: Payer: Medicare Other | Admitting: Podiatry

## 2017-01-06 DIAGNOSIS — I10 Essential (primary) hypertension: Secondary | ICD-10-CM | POA: Diagnosis not present

## 2017-01-06 DIAGNOSIS — M48062 Spinal stenosis, lumbar region with neurogenic claudication: Secondary | ICD-10-CM | POA: Diagnosis not present

## 2017-01-12 ENCOUNTER — Ambulatory Visit (INDEPENDENT_AMBULATORY_CARE_PROVIDER_SITE_OTHER): Payer: Medicare Other | Admitting: Podiatry

## 2017-01-12 ENCOUNTER — Telehealth: Payer: Self-pay | Admitting: *Deleted

## 2017-01-12 DIAGNOSIS — E1140 Type 2 diabetes mellitus with diabetic neuropathy, unspecified: Secondary | ICD-10-CM

## 2017-01-12 DIAGNOSIS — M79676 Pain in unspecified toe(s): Secondary | ICD-10-CM | POA: Diagnosis not present

## 2017-01-12 DIAGNOSIS — B351 Tinea unguium: Secondary | ICD-10-CM

## 2017-01-12 NOTE — Telephone Encounter (Signed)
Patient is needing clearance for a L3-4, L4-5, and L5-S1 lumbar laminectomy by dr Mallie Mussel pool. They also need direction regarding the patients plavix for the procedure. Will forward for dr Stanford Breed review

## 2017-01-12 NOTE — Progress Notes (Signed)
Patient ID: Stephen Wilkinson, male   DOB: 1929-02-21, 83 y.o.   MRN: [id number] Complaint:  Visit Type: Patient returns to my office for continued preventative foot care services. Complaint: Patient states" my nails have grown long and thick and become painful to walk and wear shoes" Patient has been diagnosed with DM with no foot complications. The patient presents for preventative foot care services. No changes to ROS.  He has macular degeneration with history of mother and grandmother losing their legs to diabetes.  Podiatric Exam: Vascular: dorsalis pedis and posterior tibial pulses are palpable bilateral. Capillary return is immediate. Temperature gradient is WNL. Skin turgor WNL  Sensorium: Normal Semmes Weinstein monofilament test. Normal tactile sensation bilaterally. Nail Exam: Pt has thick disfigured discolored nails with subungual debris noted bilateral entire nail hallux through fifth toenails Ulcer Exam: There is no evidence of ulcer or pre-ulcerative changes or infection. Orthopedic Exam: Muscle tone and strength are WNL. No limitations in general ROM. No crepitus or effusions noted. Foot type and digits show no abnormalities. Bony prominences are unremarkable. HAV B/L. Skin: . No infection or ulcers.Asymptomatic porokeratosis sub 5th right.  Diagnosis:  Onychomycosis, , Pain in right toe, pain in left toes,  Treatment & Plan Procedures and Treatment: Consent by patient was obtained for treatment procedures. The patient understood the discussion of treatment and procedures well. All questions were answered thoroughly reviewed. Debridement of mycotic and hypertrophic toenails, 1 through 5 bilateral and clearing of subungual debris. No ulceration, no infection noted.  Return Visit-Office Procedure: Patient instructed to return to the office for a follow up visit 10 weeks  for continued evaluation and treatment.  Gardiner Barefoot DPM

## 2017-01-12 NOTE — Telephone Encounter (Signed)
[facility] for surgery; can hold plavix for 7 days prior to procedure and resume after when ok with surgery Kirk Ruths

## 2017-01-13 NOTE — Telephone Encounter (Signed)
Will fax this note to the number provided. 

## 2017-01-20 DIAGNOSIS — H353124 Nonexudative age-related macular degeneration, left eye, advanced atrophic with subfoveal involvement: Secondary | ICD-10-CM | POA: Diagnosis not present

## 2017-01-20 DIAGNOSIS — H353113 Nonexudative age-related macular degeneration, right eye, advanced atrophic without subfoveal involvement: Secondary | ICD-10-CM | POA: Diagnosis not present

## 2017-01-20 DIAGNOSIS — H43822 Vitreomacular adhesion, left eye: Secondary | ICD-10-CM | POA: Diagnosis not present

## 2017-01-20 DIAGNOSIS — H35372 Puckering of macula, left eye: Secondary | ICD-10-CM | POA: Diagnosis not present

## 2017-01-20 DIAGNOSIS — H353211 Exudative age-related macular degeneration, right eye, with active choroidal neovascularization: Secondary | ICD-10-CM | POA: Diagnosis not present

## 2017-01-27 ENCOUNTER — Telehealth: Payer: Self-pay | Admitting: Cardiology

## 2017-01-27 ENCOUNTER — Other Ambulatory Visit: Payer: Self-pay | Admitting: Neurosurgery

## 2017-01-27 NOTE — Telephone Encounter (Signed)
Patient walked in returning surgical clearance w/[facility]. Printed 7/31 note where clearance was addressed by Dr. Stanford Breed and gave patient this note for his records.

## 2017-02-04 NOTE — Pre-Procedure Instructions (Signed)
Stephen Wilkinson  02/04/2017      [facility], [place] [address] Phone: [phone] Fax: [phone]  [facility] [address] [place] - [address] [place] [address] Phone: [phone] Fax: [phone]    Your procedure is scheduled on February 12, 2017.  Report to [facility] Admitting at [place] AM.  Call this number if you have problems the morning of surgery:  [phone]   Remember:  Do not eat food or drink liquids after midnight.  Take these medicines the morning of surgery with A SIP OF WATER metoprolol (toprol XL), alprazolam (xanax), amlodipine (norvasc), bevacizumab eye drops, famotidine (pepcid), gabapentin (neurontin), latanoprost (xalatan), levothyroxine (synthroid), tizandidien (zanaflex).  Stop taking plavix as instructed by your surgeon  7 days prior to surgery STOP taking any Aspirin, Aleve, Naproxen, Ibuprofen, Motrin, Advil, Goody's, BC's, all herbal medications, fish oil, and all vitamins     How to Manage Your Diabetes Before and After Surgery  Why is it important to control my blood sugar before and after surgery? . Improving blood sugar levels before and after surgery helps healing and can limit problems. . A way of improving blood sugar control is eating a healthy diet by: o  Eating less sugar and carbohydrates o  Increasing activity/exercise o  Talking with your doctor about reaching your blood sugar goals . High blood sugars (greater than 180 mg/dL) can raise your risk of infections and slow your recovery, so you will need to focus on controlling your diabetes during the weeks before surgery. . Make sure that the doctor who takes care of your diabetes knows about your planned surgery including the date and location.  How do I manage my blood sugar before surgery? . Check your blood sugar at least 4 times a day,  starting 2 days before surgery, to make sure that the level is not too high or low. o Check your blood sugar the morning of your surgery when you wake up and every 2 hours until you get to the Short Stay unit. . If your blood sugar is less than 70 mg/dL, you will need to treat for low blood sugar: o Do not take insulin. o Treat a low blood sugar (less than 70 mg/dL) with  cup of clear juice (cranberry or apple), 4 glucose tablets, OR glucose gel. o Recheck blood sugar in 15 minutes after treatment (to make sure it is greater than 70 mg/dL). If your blood sugar is not greater than 70 mg/dL on recheck, call [phone] for further instructions. . Report your blood sugar to the short stay nurse when you get to Short Stay.  . If you are admitted to the hospital after surgery: o Your blood sugar will be checked by the staff and you will probably be given insulin after surgery (instead of oral diabetes medicines) to make sure you have good blood sugar levels. o The goal for blood sugar control after surgery is 80-180 mg/dL.      WHAT DO I DO ABOUT MY DIABETES MEDICATION?   Marland Kitchen Do not take oral diabetes medicines (pills) the morning of surgery.  . The day of surgery, do not take other diabetes injectables, including Byetta (exenatide), Bydureon (exenatide ER), Victoza (liraglutide), or Trulicity (dulaglutide).  . If your CBG is greater than 220 mg/dL, you may take  of your sliding scale (correction) dose of insulin.  Reviewed and Endorsed by [facility] Patient Education Committee, August 2015   Do not wear jewelry, make-up or nail polish.  Do not wear lotions, powders, or perfumes, or deoderant.  Men may shave face and neck.  Do not bring valuables to the hospital.  [facility] is not responsible for any belongings or valuables.  Contacts, dentures or bridgework may not be worn into surgery.  Leave your suitcase in the car.  After surgery it may be brought to your room.  For patients  admitted to the hospital, discharge time will be determined by your treatment team.  Patients discharged the day of surgery will not be allowed to drive home.   Special instructions:   [facility]- Preparing For Surgery  Before surgery, you can play an important role. Because skin is not sterile, your skin needs to be as free of germs as possible. You can reduce the number of germs on your skin by washing with CHG (chlorahexidine gluconate) Soap before surgery.  CHG is an antiseptic cleaner which kills germs and bonds with the skin to continue killing germs even after washing.  Please do not use if you have an allergy to CHG or antibacterial soaps. If your skin becomes reddened/irritated stop using the CHG.  Do not shave (including legs and underarms) for at least 48 hours prior to first CHG shower. It is OK to shave your face.  Please follow these instructions carefully.   1. Shower the NIGHT BEFORE SURGERY and the MORNING OF SURGERY with CHG.   2. If you chose to wash your hair, wash your hair first as usual with your normal shampoo.  3. After you shampoo, rinse your hair and body thoroughly to remove the shampoo.  4. Use CHG as you would any other liquid soap. You can apply CHG directly to the skin and wash gently with a scrungie or a clean washcloth.   5. Apply the CHG Soap to your body ONLY FROM THE NECK DOWN.  Do not use on open wounds or open sores. Avoid contact with your eyes, ears, mouth and genitals (private parts). Wash genitals (private parts) with your normal soap.  6. Wash thoroughly, paying special attention to the area where your surgery will be performed.  7. Thoroughly rinse your body with warm water from the neck down.  8. DO NOT shower/wash with your normal soap after using and rinsing off the CHG Soap.  9. Pat yourself dry with a CLEAN TOWEL.   10. Wear CLEAN PAJAMAS   11. Place CLEAN SHEETS on your bed the night of your first shower and DO NOT SLEEP WITH  PETS.    Day of Surgery: Do not apply any deodorants/lotions. Please wear clean clothes to the hospital/surgery center.     Please read over the following fact sheets that you were given. Pain Booklet, Coughing and Deep Breathing, MRSA Information and Surgical Site Infection Prevention

## 2017-02-05 ENCOUNTER — Encounter (HOSPITAL_COMMUNITY): Payer: Self-pay

## 2017-02-05 ENCOUNTER — Encounter (HOSPITAL_COMMUNITY)
Admission: RE | Admit: 2017-02-05 | Discharge: 2017-02-05 | Disposition: A | Discharge status: Home / Self Care | Payer: Medicare Other | Source: Ambulatory Visit | Attending: Neurosurgery | Admitting: Neurosurgery

## 2017-02-05 DIAGNOSIS — I120 Hypertensive chronic kidney disease with stage 5 chronic kidney disease or end stage renal disease: Secondary | ICD-10-CM | POA: Insufficient documentation

## 2017-02-05 DIAGNOSIS — N189 Chronic kidney disease, unspecified: Secondary | ICD-10-CM | POA: Diagnosis not present

## 2017-02-05 DIAGNOSIS — E1122 Type 2 diabetes mellitus with diabetic chronic kidney disease: Secondary | ICD-10-CM | POA: Insufficient documentation

## 2017-02-05 DIAGNOSIS — M48061 Spinal stenosis, lumbar region without neurogenic claudication: Secondary | ICD-10-CM | POA: Diagnosis not present

## 2017-02-05 DIAGNOSIS — Z01812 Encounter for preprocedural laboratory examination: Secondary | ICD-10-CM | POA: Diagnosis not present

## 2017-02-05 HISTORY — DX: Gastro-esophageal reflux disease without esophagitis: K21.9

## 2017-02-05 HISTORY — DX: Polyneuropathy, unspecified: G62.9

## 2017-02-05 HISTORY — DX: Other specified disorders of kidney and ureter: N28.89

## 2017-02-05 HISTORY — DX: Cardiac murmur, unspecified: R01.1

## 2017-02-05 LAB — CBC WITH DIFFERENTIAL/PLATELET
Basophils Absolute: 0 10*3/uL (ref 0.0–0.1)
Basophils Relative: 0 %
EOS PCT: 1 %
Eosinophils Absolute: 0.1 10*3/uL (ref 0.0–0.7)
HEMATOCRIT: 42.9 % (ref 39.0–52.0)
Hemoglobin: 14.2 g/dL (ref 13.0–17.0)
LYMPHS ABS: 1.4 10*3/uL (ref 0.7–4.0)
LYMPHS PCT: 22 %
MCH: 32 pg (ref 26.0–34.0)
MCHC: 33.1 g/dL (ref 30.0–36.0)
MCV: 96.6 fL (ref 78.0–100.0)
Monocytes Absolute: 0.5 10*3/uL (ref 0.1–1.0)
Monocytes Relative: 8 %
NEUTROS ABS: 4.5 10*3/uL (ref 1.7–7.7)
Neutrophils Relative %: 69 %
Platelets: 213 10*3/uL (ref 150–400)
RBC: 4.44 MIL/uL (ref 4.22–5.81)
RDW: 13 % (ref 11.5–15.5)
WBC: 6.6 10*3/uL (ref 4.0–10.5)

## 2017-02-05 LAB — BASIC METABOLIC PANEL
ANION GAP: 11 (ref 5–15)
BUN: 16 mg/dL (ref 6–20)
CHLORIDE: 106 mmol/L (ref 101–111)
CO2: 23 mmol/L (ref 22–32)
Calcium: 9.6 mg/dL (ref 8.9–10.3)
Creatinine, Ser: 1.11 mg/dL (ref 0.61–1.24)
GFR calc Af Amer: >60 mL/min (ref >60)
GFR calc non Af Amer: 58 mL/min — ABNORMAL LOW (ref >60)
GLUCOSE: 116 mg/dL — AB (ref 65–99)
POTASSIUM: 4.4 mmol/L (ref 3.5–5.1)
SODIUM: 140 mmol/L (ref 135–145)

## 2017-02-05 LAB — GLUCOSE, CAPILLARY: Glucose-Capillary: 117 mg/dL — ABNORMAL HIGH (ref 65–99)

## 2017-02-05 LAB — SURGICAL PCR SCREEN
MRSA, PCR: NEGATIVE
STAPHYLOCOCCUS AUREUS: POSITIVE — AB

## 2017-02-05 LAB — HEMOGLOBIN A1C
HEMOGLOBIN A1C: 6.3 % — AB (ref 4.8–5.6)
MEAN PLASMA GLUCOSE: 134.11 mg/dL

## 2017-02-05 NOTE — Progress Notes (Addendum)
Patient stated he was instructed per Dr. Stanford Breed to stop plavix 7 days prior to surgery.  Called office and spoke to Dominica at [facility]- who stated patient should stop aspirin 5 days.  Patient has been instructed.  Patient's last dose of plavix is 02/05/17.

## 2017-02-08 NOTE — Progress Notes (Signed)
Anesthesia Chart Review:  Stephen Wilkinson is an 83 year old male scheduled for L3-4, L4-5, L5-S1 laminectomy and foraminotomy on 02/12/2017 with Earnie Larsson, MD  - Cardiologist is Kirk Ruths, MD who is aware of upcoming surgery.   PMH includes:  HTN, DM, hyperlipidemia, PVCs, stroke (1990s), TIA (2014), B12 deficiency, hypothyroidism, renal cysts, GERD. Never smoker. BMI 26. S/p R TKA 05/11/11.   Medications include: Amlodipine, ASA 81 mg, Plavix, Pepcid, levothyroxine, metformin, metoprolol. Stephen Wilkinson to hold plavix 7 days before surgery. ASA to be held 5 days before surgery.   Preoperative labs reviewed.  HbA1c 6.3, glucose 116  EKG 09/07/16: Sinus rhythm at a rate of 62. Cannot rule out prior septal infarct.  Renal US 11/30/16:  1. Large but simple and benign right renal cysts. 2. Normal ultrasound appearance of the left kidney and urinary bladder.  Nuclear stress test 09/09/15:  - Normal lexiscan myovue EF 63% no ischemia or infarction   If no changes, I anticipate Stephen Wilkinson can proceed with surgery as scheduled.   Willeen Cass, FNP-BC [facility] Short Stay Surgical Center/Anesthesiology Phone: [phone] 02/08/2017 12:52 PM

## 2017-02-10 ENCOUNTER — Other Ambulatory Visit: Payer: Self-pay

## 2017-02-10 ENCOUNTER — Telehealth: Payer: Self-pay | Admitting: Internal Medicine

## 2017-02-10 NOTE — Telephone Encounter (Signed)
Patient called in stating he was going to drop a list of current medications off to give to Dr. Jenny Reichmann.  States he is requesting his med list to be updated so it is correct at the hospital when he goes for surgery tomorrow.  I did inform patient he may need an appt.  Patient states that some of the medication that is showing on his current med list is medication that he and Dr. Jenny Reichmann has already had a conversation about and has been decided that he would not take.

## 2017-02-10 NOTE — Telephone Encounter (Signed)
OK for list, but please let pt know, he can simply tell them his medications that he is actually taking , as this a required part of the exam prior to any prodedure

## 2017-02-11 NOTE — Telephone Encounter (Signed)
Has been done! 

## 2017-02-11 NOTE — Telephone Encounter (Signed)
Patient had been informed of this but wanted med list we had on file to be updated.

## 2017-02-12 ENCOUNTER — Encounter (HOSPITAL_COMMUNITY)
Admission: RE | Disposition: A | Discharge status: Home / Self Care | Payer: Self-pay | Source: Ambulatory Visit | Attending: Neurosurgery

## 2017-02-12 ENCOUNTER — Encounter (HOSPITAL_COMMUNITY): Payer: Self-pay | Admitting: *Deleted

## 2017-02-12 ENCOUNTER — Inpatient Hospital Stay (HOSPITAL_COMMUNITY): Payer: Medicare Other | Admitting: Emergency Medicine

## 2017-02-12 ENCOUNTER — Inpatient Hospital Stay (HOSPITAL_COMMUNITY): Payer: Medicare Other | Admitting: Anesthesiology

## 2017-02-12 ENCOUNTER — Observation Stay (HOSPITAL_COMMUNITY)
Admission: RE | Admit: 2017-02-12 | Discharge: 2017-02-13 | Disposition: A | Discharge status: Home / Self Care | Payer: Medicare Other | Source: Ambulatory Visit | Attending: Neurosurgery | Admitting: Neurosurgery

## 2017-02-12 ENCOUNTER — Inpatient Hospital Stay (HOSPITAL_COMMUNITY): Payer: Medicare Other

## 2017-02-12 DIAGNOSIS — M4807 Spinal stenosis, lumbosacral region: Secondary | ICD-10-CM | POA: Insufficient documentation

## 2017-02-12 DIAGNOSIS — E039 Hypothyroidism, unspecified: Secondary | ICD-10-CM | POA: Diagnosis not present

## 2017-02-12 DIAGNOSIS — F418 Other specified anxiety disorders: Secondary | ICD-10-CM | POA: Diagnosis not present

## 2017-02-12 DIAGNOSIS — I10 Essential (primary) hypertension: Secondary | ICD-10-CM | POA: Insufficient documentation

## 2017-02-12 DIAGNOSIS — L259 Unspecified contact dermatitis, unspecified cause: Secondary | ICD-10-CM | POA: Diagnosis not present

## 2017-02-12 DIAGNOSIS — F329 Major depressive disorder, single episode, unspecified: Secondary | ICD-10-CM | POA: Insufficient documentation

## 2017-02-12 DIAGNOSIS — Z9889 Other specified postprocedural states: Secondary | ICD-10-CM | POA: Diagnosis not present

## 2017-02-12 DIAGNOSIS — Z7984 Long term (current) use of oral hypoglycemic drugs: Secondary | ICD-10-CM | POA: Insufficient documentation

## 2017-02-12 DIAGNOSIS — E785 Hyperlipidemia, unspecified: Secondary | ICD-10-CM | POA: Insufficient documentation

## 2017-02-12 DIAGNOSIS — M48062 Spinal stenosis, lumbar region with neurogenic claudication: Principal | ICD-10-CM | POA: Diagnosis present

## 2017-02-12 DIAGNOSIS — N529 Male erectile dysfunction, unspecified: Secondary | ICD-10-CM | POA: Insufficient documentation

## 2017-02-12 DIAGNOSIS — Z8673 Personal history of transient ischemic attack (TIA), and cerebral infarction without residual deficits: Secondary | ICD-10-CM | POA: Diagnosis not present

## 2017-02-12 DIAGNOSIS — M4726 Other spondylosis with radiculopathy, lumbar region: Secondary | ICD-10-CM | POA: Insufficient documentation

## 2017-02-12 DIAGNOSIS — K219 Gastro-esophageal reflux disease without esophagitis: Secondary | ICD-10-CM | POA: Diagnosis not present

## 2017-02-12 DIAGNOSIS — M9973 Connective tissue and disc stenosis of intervertebral foramina of lumbar region: Secondary | ICD-10-CM | POA: Diagnosis not present

## 2017-02-12 DIAGNOSIS — Z79899 Other long term (current) drug therapy: Secondary | ICD-10-CM | POA: Diagnosis not present

## 2017-02-12 DIAGNOSIS — M4727 Other spondylosis with radiculopathy, lumbosacral region: Secondary | ICD-10-CM | POA: Insufficient documentation

## 2017-02-12 DIAGNOSIS — Z7982 Long term (current) use of aspirin: Secondary | ICD-10-CM | POA: Diagnosis not present

## 2017-02-12 DIAGNOSIS — Z96651 Presence of right artificial knee joint: Secondary | ICD-10-CM | POA: Diagnosis not present

## 2017-02-12 DIAGNOSIS — Z7902 Long term (current) use of antithrombotics/antiplatelets: Secondary | ICD-10-CM | POA: Diagnosis not present

## 2017-02-12 DIAGNOSIS — E119 Type 2 diabetes mellitus without complications: Secondary | ICD-10-CM | POA: Insufficient documentation

## 2017-02-12 DIAGNOSIS — E538 Deficiency of other specified B group vitamins: Secondary | ICD-10-CM | POA: Diagnosis not present

## 2017-02-12 DIAGNOSIS — H409 Unspecified glaucoma: Secondary | ICD-10-CM | POA: Diagnosis not present

## 2017-02-12 DIAGNOSIS — F0390 Unspecified dementia without behavioral disturbance: Secondary | ICD-10-CM | POA: Insufficient documentation

## 2017-02-12 DIAGNOSIS — R011 Cardiac murmur, unspecified: Secondary | ICD-10-CM | POA: Insufficient documentation

## 2017-02-12 DIAGNOSIS — Z419 Encounter for procedure for purposes other than remedying health state, unspecified: Secondary | ICD-10-CM

## 2017-02-12 DIAGNOSIS — M1990 Unspecified osteoarthritis, unspecified site: Secondary | ICD-10-CM | POA: Insufficient documentation

## 2017-02-12 DIAGNOSIS — I493 Ventricular premature depolarization: Secondary | ICD-10-CM | POA: Diagnosis not present

## 2017-02-12 HISTORY — PX: LUMBAR LAMINECTOMY/DECOMPRESSION MICRODISCECTOMY: SHX5026

## 2017-02-12 LAB — GLUCOSE, CAPILLARY
Glucose-Capillary: 135 mg/dL — ABNORMAL HIGH (ref 65–99)
Glucose-Capillary: 167 mg/dL — ABNORMAL HIGH (ref 65–99)
Glucose-Capillary: 194 mg/dL — ABNORMAL HIGH (ref 65–99)
Glucose-Capillary: 228 mg/dL — ABNORMAL HIGH (ref 65–99)

## 2017-02-12 SURGERY — LUMBAR LAMINECTOMY/DECOMPRESSION MICRODISCECTOMY 3 LEVELS
Anesthesia: General | Site: Spine Lumbar

## 2017-02-12 MED ORDER — VANCOMYCIN HCL 1000 MG IV SOLR
INTRAVENOUS | Status: AC
  Filled 2017-02-12: qty 1000

## 2017-02-12 MED ORDER — EPHEDRINE SULFATE-NACL 50-0.9 MG/10ML-% IV SOSY
PREFILLED_SYRINGE | INTRAVENOUS | Status: DC | PRN
  Administered 2017-02-12 (×2): 5 mg via INTRAVENOUS
  Administered 2017-02-12: 10 mg via INTRAVENOUS

## 2017-02-12 MED ORDER — CHLORHEXIDINE GLUCONATE CLOTH 2 % EX PADS: 6.0000 | MEDICATED_PAD | Freq: Once | CUTANEOUS | Status: DC

## 2017-02-12 MED ORDER — THROMBIN 20000 UNITS EX SOLR
CUTANEOUS | Status: DC | PRN
  Administered 2017-02-12: 20 mL via TOPICAL

## 2017-02-12 MED ORDER — DEXAMETHASONE SODIUM PHOSPHATE 10 MG/ML IJ SOLN
10.0000 mg | INTRAMUSCULAR | Status: AC
Start: 2017-02-12 — End: 2017-02-12
  Administered 2017-02-12: 5 mg via INTRAVENOUS

## 2017-02-12 MED ORDER — OXYCODONE HCL 5 MG/5ML PO SOLN: 5.0000 mg | Freq: Once | ORAL | Status: DC | PRN

## 2017-02-12 MED ORDER — MENTHOL 3 MG MT LOZG: 1.0000 | LOZENGE | OROMUCOSAL | Status: DC | PRN

## 2017-02-12 MED ORDER — METOPROLOL SUCCINATE ER 25 MG PO TB24
25.0000 mg | ORAL_TABLET | ORAL | Status: DC
  Administered 2017-02-13: 25 mg via ORAL
  Filled 2017-02-12: qty 1

## 2017-02-12 MED ORDER — CYCLOBENZAPRINE HCL 10 MG PO TABS: 10.0000 mg | ORAL_TABLET | Freq: Three times a day (TID) | ORAL | Status: DC | PRN

## 2017-02-12 MED ORDER — MIDAZOLAM HCL 2 MG/2ML IJ SOLN
INTRAMUSCULAR | Status: AC
  Filled 2017-02-12: qty 2

## 2017-02-12 MED ORDER — LEVOTHYROXINE SODIUM 100 MCG PO TABS
100.0000 ug | ORAL_TABLET | Freq: Every day | ORAL | Status: DC
  Administered 2017-02-13: 100 ug via ORAL
  Filled 2017-02-12: qty 1

## 2017-02-12 MED ORDER — BUPIVACAINE HCL (PF) 0.25 % IJ SOLN
INTRAMUSCULAR | Status: DC | PRN
  Administered 2017-02-12: 20 mL

## 2017-02-12 MED ORDER — DEXAMETHASONE SODIUM PHOSPHATE 10 MG/ML IJ SOLN
INTRAMUSCULAR | Status: AC
  Filled 2017-02-12: qty 1

## 2017-02-12 MED ORDER — PROPOFOL 10 MG/ML IV BOLUS
INTRAVENOUS | Status: DC | PRN
  Administered 2017-02-12: 200 mg via INTRAVENOUS

## 2017-02-12 MED ORDER — PHENOL 1.4 % MT LIQD: 1.0000 | OROMUCOSAL | Status: DC | PRN

## 2017-02-12 MED ORDER — HYDROMORPHONE HCL 1 MG/ML IJ SOLN: 0.5000 mg | INTRAMUSCULAR | Status: DC | PRN

## 2017-02-12 MED ORDER — INSULIN ASPART 100 UNIT/ML ~~LOC~~ SOLN
0.0000 [IU] | Freq: Three times a day (TID) | SUBCUTANEOUS | Status: DC
Start: 2017-02-13 — End: 2017-02-13

## 2017-02-12 MED ORDER — ROCURONIUM BROMIDE 100 MG/10ML IV SOLN
INTRAVENOUS | Status: DC | PRN
  Administered 2017-02-12 (×3): 10 mg via INTRAVENOUS
  Administered 2017-02-12: 20 mg via INTRAVENOUS
  Administered 2017-02-12: 50 mg via INTRAVENOUS

## 2017-02-12 MED ORDER — THROMBIN 20000 UNITS EX SOLR
CUTANEOUS | Status: AC
  Filled 2017-02-12: qty 20000

## 2017-02-12 MED ORDER — ASPIRIN EC 81 MG PO TBEC
81.0000 mg | DELAYED_RELEASE_TABLET | Freq: Every day | ORAL | Status: DC
  Administered 2017-02-13: 81 mg via ORAL
  Filled 2017-02-12 (×2): qty 1

## 2017-02-12 MED ORDER — FAMOTIDINE 20 MG PO TABS
20.0000 mg | ORAL_TABLET | Freq: Two times a day (BID) | ORAL | Status: DC
  Administered 2017-02-12 – 2017-02-13 (×2): 20 mg via ORAL
  Filled 2017-02-12 (×2): qty 1

## 2017-02-12 MED ORDER — CEFAZOLIN SODIUM-DEXTROSE 2-4 GM/100ML-% IV SOLN
2.0000 g | INTRAVENOUS | Status: AC
  Administered 2017-02-12: 2 g via INTRAVENOUS

## 2017-02-12 MED ORDER — HYDROCODONE-ACETAMINOPHEN 5-325 MG PO TABS
1.0000 | ORAL_TABLET | ORAL | Status: DC | PRN
  Administered 2017-02-13: 1 via ORAL
  Filled 2017-02-12: qty 1

## 2017-02-12 MED ORDER — THROMBIN 5000 UNITS EX SOLR
CUTANEOUS | Status: DC | PRN
  Administered 2017-02-12: 13:00:00 via TOPICAL

## 2017-02-12 MED ORDER — BUPIVACAINE HCL (PF) 0.5 % IJ SOLN
INTRAMUSCULAR | Status: AC
  Filled 2017-02-12: qty 30

## 2017-02-12 MED ORDER — SODIUM CHLORIDE 0.9 % IV SOLN: 250.0000 mL | INTRAVENOUS | Status: DC

## 2017-02-12 MED ORDER — VANCOMYCIN HCL 1000 MG IV SOLR
INTRAVENOUS | Status: DC | PRN
  Administered 2017-02-12: 1000 mg via TOPICAL

## 2017-02-12 MED ORDER — LATANOPROST 0.005 % OP SOLN
1.0000 [drp] | Freq: Every day | OPHTHALMIC | Status: DC
  Filled 2017-02-12: qty 2.5

## 2017-02-12 MED ORDER — ONDANSETRON HCL 4 MG/2ML IJ SOLN: 4.0000 mg | Freq: Four times a day (QID) | INTRAMUSCULAR | Status: DC | PRN

## 2017-02-12 MED ORDER — BACITRACIN 50000 UNITS IM SOLR
INTRAMUSCULAR | Status: DC | PRN
  Administered 2017-02-12: 500 mL

## 2017-02-12 MED ORDER — ONDANSETRON HCL 4 MG PO TABS: 4.0000 mg | ORAL_TABLET | Freq: Four times a day (QID) | ORAL | Status: DC | PRN

## 2017-02-12 MED ORDER — AMLODIPINE BESYLATE 5 MG PO TABS
5.0000 mg | ORAL_TABLET | ORAL | Status: DC
  Administered 2017-02-13: 5 mg via ORAL
  Filled 2017-02-12: qty 1

## 2017-02-12 MED ORDER — PROPOFOL 10 MG/ML IV BOLUS
INTRAVENOUS | Status: AC
  Filled 2017-02-12: qty 20

## 2017-02-12 MED ORDER — FENTANYL CITRATE (PF) 250 MCG/5ML IJ SOLN
INTRAMUSCULAR | Status: AC
  Filled 2017-02-12: qty 5

## 2017-02-12 MED ORDER — OXYCODONE HCL 5 MG PO TABS: 5.0000 mg | ORAL_TABLET | Freq: Once | ORAL | Status: DC | PRN

## 2017-02-12 MED ORDER — LACTATED RINGERS IV SOLN
INTRAVENOUS | Status: DC
  Administered 2017-02-12 (×2): via INTRAVENOUS

## 2017-02-12 MED ORDER — ONDANSETRON HCL 4 MG/2ML IJ SOLN
INTRAMUSCULAR | Status: DC | PRN
  Administered 2017-02-12: 4 mg via INTRAVENOUS

## 2017-02-12 MED ORDER — 0.9 % SODIUM CHLORIDE (POUR BTL) OPTIME
TOPICAL | Status: DC | PRN
  Administered 2017-02-12: 1000 mL

## 2017-02-12 MED ORDER — GLYCOPYRROLATE 0.2 MG/ML IJ SOLN
INTRAMUSCULAR | Status: DC | PRN
  Administered 2017-02-12: 0.2 mg via INTRAVENOUS

## 2017-02-12 MED ORDER — THROMBIN 5000 UNITS EX SOLR
CUTANEOUS | Status: AC
  Filled 2017-02-12: qty 5000

## 2017-02-12 MED ORDER — SODIUM CHLORIDE 0.9% FLUSH: 3.0000 mL | INTRAVENOUS | Status: DC | PRN

## 2017-02-12 MED ORDER — KETOROLAC TROMETHAMINE 15 MG/ML IJ SOLN
7.5000 mg | Freq: Four times a day (QID) | INTRAMUSCULAR | Status: DC
  Administered 2017-02-12 – 2017-02-13 (×3): 7.5 mg via INTRAVENOUS
  Filled 2017-02-12 (×3): qty 1

## 2017-02-12 MED ORDER — CEFAZOLIN SODIUM-DEXTROSE 1-4 GM/50ML-% IV SOLN
1.0000 g | Freq: Three times a day (TID) | INTRAVENOUS | Status: AC
  Administered 2017-02-12 – 2017-02-13 (×2): 1 g via INTRAVENOUS
  Filled 2017-02-12 (×2): qty 50

## 2017-02-12 MED ORDER — PHENYLEPHRINE HCL 10 MG/ML IJ SOLN
INTRAVENOUS | Status: DC | PRN
  Administered 2017-02-12: 20 ug/min via INTRAVENOUS

## 2017-02-12 MED ORDER — METFORMIN HCL 500 MG PO TABS
1000.0000 mg | ORAL_TABLET | Freq: Every day | ORAL | Status: DC
  Administered 2017-02-13: 1000 mg via ORAL
  Filled 2017-02-12: qty 2

## 2017-02-12 MED ORDER — FENTANYL CITRATE (PF) 100 MCG/2ML IJ SOLN: 25.0000 ug | INTRAMUSCULAR | Status: DC | PRN

## 2017-02-12 MED ORDER — FENTANYL CITRATE (PF) 250 MCG/5ML IJ SOLN
INTRAMUSCULAR | Status: DC | PRN
  Administered 2017-02-12: 150 ug via INTRAVENOUS
  Administered 2017-02-12: 100 ug via INTRAVENOUS

## 2017-02-12 MED ORDER — LIDOCAINE HCL (CARDIAC) 20 MG/ML IV SOLN
INTRAVENOUS | Status: DC | PRN
  Administered 2017-02-12: 50 mg via INTRATRACHEAL

## 2017-02-12 MED ORDER — INSULIN ASPART 100 UNIT/ML ~~LOC~~ SOLN
0.0000 [IU] | Freq: Three times a day (TID) | SUBCUTANEOUS | Status: DC
  Administered 2017-02-12: 3 [IU] via SUBCUTANEOUS

## 2017-02-12 MED ORDER — CEFAZOLIN SODIUM-DEXTROSE 2-4 GM/100ML-% IV SOLN
INTRAVENOUS | Status: AC
  Filled 2017-02-12: qty 100

## 2017-02-12 MED ORDER — ALPRAZOLAM 0.25 MG PO TABS
0.2500 mg | ORAL_TABLET | Freq: Two times a day (BID) | ORAL | Status: DC
  Administered 2017-02-12 – 2017-02-13 (×2): 0.25 mg via ORAL
  Filled 2017-02-12 (×2): qty 1

## 2017-02-12 MED ORDER — INSULIN ASPART 100 UNIT/ML ~~LOC~~ SOLN
0.0000 [IU] | Freq: Every day | SUBCUTANEOUS | Status: DC
  Administered 2017-02-12: 2 [IU] via SUBCUTANEOUS

## 2017-02-12 MED ORDER — SODIUM CHLORIDE 0.9% FLUSH
3.0000 mL | Freq: Two times a day (BID) | INTRAVENOUS | Status: DC
  Administered 2017-02-13: 3 mL via INTRAVENOUS

## 2017-02-12 SURGICAL SUPPLY — 57 items
ADH SKN CLS APL DERMABOND .7 (GAUZE/BANDAGES/DRESSINGS) ×1
APL SKNCLS STERI-STRIP NONHPOA (GAUZE/BANDAGES/DRESSINGS) ×1
BAG DECANTER FOR FLEXI CONT (MISCELLANEOUS) ×3 IMPLANT
BENZOIN TINCTURE PRP APPL 2/3 (GAUZE/BANDAGES/DRESSINGS) ×3 IMPLANT
BLADE CLIPPER SURG (BLADE) ×2 IMPLANT
BUR CUTTER 7.0 ROUND (BURR) ×3 IMPLANT
CANISTER SUCT 3000ML PPV (MISCELLANEOUS) ×3 IMPLANT
CARTRIDGE OIL MAESTRO DRILL (MISCELLANEOUS) ×1 IMPLANT
CLOSURE WOUND 1/2 X4 (GAUZE/BANDAGES/DRESSINGS) ×1
DECANTER SPIKE VIAL GLASS SM (MISCELLANEOUS) ×3 IMPLANT
DERMABOND ADVANCED (GAUZE/BANDAGES/DRESSINGS) ×2
DERMABOND ADVANCED .7 DNX12 (GAUZE/BANDAGES/DRESSINGS) ×1 IMPLANT
DIFFUSER DRILL AIR PNEUMATIC (MISCELLANEOUS) ×3 IMPLANT
DRAPE HALF SHEET 40X57 (DRAPES) IMPLANT
DRAPE LAPAROTOMY 100X72X124 (DRAPES) ×3 IMPLANT
DRAPE MICROSCOPE LEICA (MISCELLANEOUS) ×3 IMPLANT
DRAPE POUCH INSTRU U-SHP 10X18 (DRAPES) ×3 IMPLANT
DRAPE SURG 17X23 STRL (DRAPES) ×6 IMPLANT
DRSG OPSITE POSTOP 4X6 (GAUZE/BANDAGES/DRESSINGS) ×2 IMPLANT
DURAPREP 26ML APPLICATOR (WOUND CARE) ×3 IMPLANT
ELECT REM PT RETURN 9FT ADLT (ELECTROSURGICAL) ×3
ELECTRODE REM PT RTRN 9FT ADLT (ELECTROSURGICAL) ×1 IMPLANT
GAUZE SPONGE 4X4 12PLY STRL (GAUZE/BANDAGES/DRESSINGS) ×3 IMPLANT
GAUZE SPONGE 4X4 16PLY XRAY LF (GAUZE/BANDAGES/DRESSINGS) IMPLANT
GLOVE BIO SURGEON STRL SZ8 (GLOVE) ×2 IMPLANT
GLOVE BIO SURGEON STRL SZ8.5 (GLOVE) ×2 IMPLANT
GLOVE BIOGEL PI IND STRL 7.0 (GLOVE) IMPLANT
GLOVE BIOGEL PI IND STRL 7.5 (GLOVE) IMPLANT
GLOVE BIOGEL PI IND STRL 8 (GLOVE) IMPLANT
GLOVE BIOGEL PI INDICATOR 7.0 (GLOVE) ×4
GLOVE BIOGEL PI INDICATOR 7.5 (GLOVE) ×4
GLOVE BIOGEL PI INDICATOR 8 (GLOVE) ×2
GLOVE ECLIPSE 9.0 STRL (GLOVE) ×3 IMPLANT
GOWN STRL REUS W/ TWL LRG LVL3 (GOWN DISPOSABLE) IMPLANT
GOWN STRL REUS W/ TWL XL LVL3 (GOWN DISPOSABLE) ×1 IMPLANT
GOWN STRL REUS W/TWL 2XL LVL3 (GOWN DISPOSABLE) IMPLANT
GOWN STRL REUS W/TWL LRG LVL3 (GOWN DISPOSABLE) ×6
GOWN STRL REUS W/TWL XL LVL3 (GOWN DISPOSABLE) ×9
HEMOSTAT POWDER SURGIFOAM 1G (HEMOSTASIS) ×2 IMPLANT
KIT BASIN OR (CUSTOM PROCEDURE TRAY) ×3 IMPLANT
KIT ROOM TURNOVER OR (KITS) ×3 IMPLANT
NDL SPNL 22GX3.5 QUINCKE BK (NEEDLE) ×1 IMPLANT
NEEDLE HYPO 22GX1.5 SAFETY (NEEDLE) ×3 IMPLANT
NEEDLE SPNL 22GX3.5 QUINCKE BK (NEEDLE) ×3 IMPLANT
NS IRRIG 1000ML POUR BTL (IV SOLUTION) ×3 IMPLANT
OIL CARTRIDGE MAESTRO DRILL (MISCELLANEOUS) ×3
PACK LAMINECTOMY NEURO (CUSTOM PROCEDURE TRAY) ×3 IMPLANT
PAD ARMBOARD 7.5X6 YLW CONV (MISCELLANEOUS) ×9 IMPLANT
RUBBERBAND STERILE (MISCELLANEOUS) ×6 IMPLANT
SPONGE SURGIFOAM ABS GEL 100 (HEMOSTASIS) ×2 IMPLANT
SPONGE SURGIFOAM ABS GEL SZ50 (HEMOSTASIS) ×1 IMPLANT
STRIP CLOSURE SKIN 1/2X4 (GAUZE/BANDAGES/DRESSINGS) ×2 IMPLANT
SUT VIC AB 2-0 CT1 18 (SUTURE) ×3 IMPLANT
SUT VIC AB 3-0 SH 8-18 (SUTURE) ×3 IMPLANT
TOWEL GREEN STERILE (TOWEL DISPOSABLE) ×3 IMPLANT
TOWEL GREEN STERILE FF (TOWEL DISPOSABLE) ×3 IMPLANT
WATER STERILE IRR 1000ML POUR (IV SOLUTION) ×3 IMPLANT

## 2017-02-12 NOTE — Anesthesia Procedure Notes (Signed)
Procedure Name: Intubation Date/Time: 02/12/2017 11:36 AM Performed by: Mariea Clonts Pre-anesthesia Checklist: Patient identified, Emergency Drugs available, Suction available and Patient being monitored Patient Re-evaluated:Patient Re-evaluated prior to induction Oxygen Delivery Method: Circle System Utilized Preoxygenation: Pre-oxygenation with 100% oxygen Induction Type: IV induction Ventilation: Mask ventilation without difficulty Laryngoscope Size: Mac and 3 Grade View: Grade II Tube type: Oral Tube size: 7.5 mm Number of attempts: 2 Airway Equipment and Method: Stylet and Oral airway Placement Confirmation: ETT inserted through vocal cords under direct vision,  positive ETCO2 and breath sounds checked- equal and bilateral Tube secured with: Tape Dental Injury: Teeth and Oropharynx as per pre-operative assessment

## 2017-02-12 NOTE — Anesthesia Preprocedure Evaluation (Signed)
Anesthesia Evaluation  Patient identified by MRN, date of birth, ID band Patient awake    Reviewed: Allergy & Precautions, H&P , NPO status , Patient's Chart, lab work & pertinent test results  Airway Mallampati: II   Neck ROM: full    Dental   Pulmonary    breath sounds clear to auscultation       Cardiovascular hypertension,  Rhythm:regular Rate:Normal     Neuro/Psych PSYCHIATRIC DISORDERS Anxiety Depression TIA Neuromuscular disease    GI/Hepatic GERD  ,  Endo/Other  diabetes, Type 2Hypothyroidism   Renal/GU      Musculoskeletal  (+) Arthritis ,   Abdominal   Peds  Hematology   Anesthesia Other Findings   Reproductive/Obstetrics                             Anesthesia Physical Anesthesia Plan  ASA: III  Anesthesia Plan: General   Post-op Pain Management:    Induction: Intravenous  PONV Risk Score and Plan: 2 and Ondansetron and Dexamethasone  Airway Management Planned: Oral ETT  Additional Equipment:   Intra-op Plan:   Post-operative Plan: Extubation in OR  Informed Consent: I have reviewed the patients History and Physical, chart, labs and discussed the procedure including the risks, benefits and alternatives for the proposed anesthesia with the patient or authorized representative who has indicated his/her understanding and acceptance.     Plan Discussed with: CRNA, Anesthesiologist and Surgeon  Anesthesia Plan Comments:         Anesthesia Quick Evaluation

## 2017-02-12 NOTE — Op Note (Signed)
Date of procedure: 02/12/2017  Date of dictation: Same  Service: Neurosurgery  Preoperative diagnosis: Lumbar stenosis with neurogenic claudication  Postoperative diagnosis: Same  Procedure Name: L3-4, L4-5, L5-S1 decompressive laminectomy with foraminotomies.  Surgeon:Zauria Dombek A.Meika Earll, M.D.  Asst. Surgeon: Arnoldo Morale  Anesthesia: General  Indication: 83 year old male with bilateral lower trimming pain. Seizures and weakness consistent with neurogenic claudication failing conservative management. Workup demonstrates evidence of severe multilevel lumbar spondylosis with stenosis.  Operative note: Induction anesthesia, patient position prone onto Wilson frame and a properly padded. Lumbar region prepped and draped sterilely. Incision made overlying L4. Dissection performed bilaterally. Retractor placed. X-ray taken. Level confirmed. Decompressive laminectomy then performed using Leksell rongeurs Kerrison rongeurs the high-speed drill to remove the inferior three quarters of the lamina of L3 the entire lamina of L4 and L5. A medial facetectomies of L3-4, L4-5 and L5-S1 were performed. Ligament flavum elevated and resected. Foraminotomies completed by undercutting the lateral gutters bilaterally. At this point a very thorough decompression been achieved. There is no evidence of injury to the thecal sac or nerve roots. Wound is then irrigated antibiotic solution. Gelfoam was placed topically for hemostasis which is under good. Vancomycin powder was placed the deep wound space. Wounds and close in layers with Vicryl sutures. Steri-Strips and sterile dressing were applied. No apparent complications. Patient tolerated the procedure well and he returns to the recovery room postop.

## 2017-02-12 NOTE — Brief Op Note (Signed)
02/12/2017  1:58 PM  PATIENT:  Stephen Wilkinson  83 y.o. male  PRE-OPERATIVE DIAGNOSIS:  Lumbar Stenosis  POST-OPERATIVE DIAGNOSIS:  Lumbar Stenosis  PROCEDURE:  Procedure(s): Lumbar Three-Four, Lumbar Four-Five, Lumbar Five-Sacral One Laminectomy and Foraminotomy (N/A)  SURGEON:  Surgeon(s) and Role:    * Earnie Larsson, MD - Primary    * Newman Pies, MD - Assisting  PHYSICIAN ASSISTANT:   ASSISTANTS:    ANESTHESIA:   general  EBL:  Total I/O In: 1000 [I.V.:1000] Out: 300 [Blood:300]  BLOOD ADMINISTERED:none  DRAINS: none   LOCAL MEDICATIONS USED:  MARCAINE     SPECIMEN:  No Specimen  DISPOSITION OF SPECIMEN:  N/A  COUNTS:  YES  TOURNIQUET:  * No tourniquets in log *  DICTATION: .Dragon Dictation  PLAN OF CARE: Admit for overnight observation  PATIENT DISPOSITION:  PACU - hemodynamically stable.   Delay start of Pharmacological VTE agent (>24hrs) due to surgical blood loss or risk of bleeding: yes

## 2017-02-12 NOTE — OR Nursing (Signed)
Pt w/ short term memory blunted, repetitive w/ questions, very pleasantly reoriented.

## 2017-02-12 NOTE — H&P (Signed)
Stephen Wilkinson is an 83 y.o. male.   Chief Complaint: Back pain and weakness. HPI: 83 year old male with progressive bilateral lower extremity numbness paresthesias and weakness consistent with neurogenic claudication and failing conservative management. Workup demonstrates evidence of severe multilevel lumbar spondylosis with stenosis. Patient presents now for decompressive surgery.  Past Medical History:  Diagnosis Date  . Acute blood loss anemia 05/15/2011   post surgical  . Altered mental status 05/15/2011  . Anxiety   . B12 deficiency 09/23/2016  . Blood transfusion without reported diagnosis   . Cancer ([place])    basal and squamous skin carcinoma   . Cataract   . Chronic kidney disease    hx of uti due to catheter hx of prostatitis, BPH -   . Depression   . Diabetes mellitus   . GERD (gastroesophageal reflux disease)   . Glaucoma 01/04/2013  . Heart murmur   . History of nuclear stress test    Myoview 3/17: EF 62%, no ischemia or scar, low risk  . Hx of transient ischemic attack (TIA) 01/04/2013  . Hx: UTI (urinary tract infection)   . Hyperlipidemia   . Hypertension   . Hypokalemia 05/15/2011  . Hypothyroidism   . Insomnia   . Macular degeneration   . Neuropathy   . OA (osteoarthritis)   . Pneumonia   . PVC's (premature ventricular contractions)   . Renal mass   . Stroke ([place])    mid 1990s     Past Surgical History:  Procedure Laterality Date  . APPENDECTOMY    . CATARACT EXTRACTION W/ INTRAOCULAR LENS  IMPLANT, BILATERAL    . INGUINAL HERNIA REPAIR     X3  . OTHER SURGICAL HISTORY     microwave procedure for prostate - 09/12   . PROSTATE ABLATION     microwave  . REPLACEMENT TOTAL KNEE    . THYROIDECTOMY    . TONSILLECTOMY AND ADENOIDECTOMY    . TOTAL KNEE ARTHROPLASTY  05/11/2011   Procedure: TOTAL KNEE ARTHROPLASTY;  Surgeon: Gearlean Alf;  Location: [facility];  Service: Orthopedics;  Laterality: Right;  . US ECHOCARDIOGRAPHY  11/10/2006   EF 55-60%  . US  ECHOCARDIOGRAPHY  09/20/2002   EF 65-70%    Family History  Problem Relation Age of Onset  . Hypertension Mother   . Arthritis Mother   . Diabetes Mother   . Hypertension Father   . Stroke Father   . Kidney disease Father   . Hypertension Brother   . Diabetes Brother    Social History:  reports that he has never smoked. He has never used smokeless tobacco. He reports that he drinks about 4.2 oz of alcohol per week . He reports that he does not use drugs.  Allergies: No Known Allergies  Medications Prior to Admission  Medication Sig Dispense Refill  . ALPRAZolam (XANAX) 0.25 MG tablet Take 1 tablet (0.25 mg total) by mouth 2 (two) times daily. 180 tablet 1  . amLODipine (NORVASC) 5 MG tablet Take 1 tablet (5 mg total) by mouth every morning. 90 tablet 3  . aspirin 81 MG tablet Take 81 mg by mouth daily.    . Bevacizumab (AVASTIN IV) Place 1 drop into the right eye as directed. Every 6-8 weeks    . clopidogrel (PLAVIX) 75 MG tablet Take 1 tablet (75 mg total) by mouth daily. 90 tablet 3  . famotidine (PEPCID) 20 MG tablet Take 1 tablet (20 mg total) by mouth 2 (two) times daily. [place]  tablet 3  . latanoprost (XALATAN) 0.005 % ophthalmic solution Place 1 drop into both eyes at bedtime.     Marland Kitchen levothyroxine (SYNTHROID, LEVOTHROID) 100 MCG tablet Take 115mcg by mouth on Sunday, Tuesday, Thursday, and Saturday. Take 1.5tablets on Monday Wednesday, and Friday 115 tablet 3  . metFORMIN (GLUCOPHAGE) 1000 MG tablet Take 1 tablet (1,000 mg total) by mouth daily. 90 tablet 3  . metoprolol succinate (TOPROL-XL) 25 MG 24 hr tablet Take 1 tablet (25 mg total) by mouth every morning.    . Multiple Vitamins-Minerals (ICAPS AREDS 2 PO) Take 2 capsules by mouth daily.    . clobetasol cream (TEMOVATE) 8.65 % Apply 1 application topically daily as needed (for contact dermatitis). Contact dermatitis  3  . glucose blood (ONE TOUCH ULTRA TEST) test strip Use as directed once daily to check blood sugar.   Diagnosis code E11.42 100 each 11  . mupirocin nasal ointment (BACTROBAN) 2 % Place 1 application into the nose 2 (two) times daily. Use one-half of tube in each nostril twice daily for five (5) days. After application, press sides of nose together and gently massage. (Patient not taking: Reported on 02/03/2017) 10 g 0  . sildenafil (VIAGRA) 100 MG tablet Take 0.5-1 tablets (50-100 mg total) by mouth daily as needed for erectile dysfunction. (Patient not taking: Reported on 02/03/2017) 8 tablet 11  . tiZANidine (ZANAFLEX) 4 MG tablet Take 1 tablet (4 mg total) by mouth every 6 (six) hours as needed for muscle spasms. (Patient not taking: Reported on 02/03/2017) 600 tablet 1    Results for orders placed or performed during the hospital encounter of 02/12/17 (from the past 48 hour(s))  Glucose, capillary     Status: Abnormal   Collection Time: 02/12/17  8:50 AM  Result Value Ref Range   Glucose-Capillary 135 (H) 65 - 99 mg/dL   No results found.  Pertinent items noted in HPI and remainder of comprehensive ROS otherwise negative.  Blood pressure 120/61, pulse (!) 59, temperature 98.2 F (36.8 C), temperature source Oral, resp. rate 18, height 6\' 1"  (1.854 m), weight 88 kg (194 lb), SpO2 99 %.  Patient is awake and alert. He is oriented and appropriate. His cranial nerve function is intact. Speech is fluent. Judgment and insight are intact. Motor examination with some mild weakness of dorsiflexion bilaterally. Otherwise motor strength intact. Sensory examination with some patchy distal sensory loss in both lower extremities. Deep tendon reflexes in both lower extremities are hypoactive. Gait is slow and shuffling. Posture is flexed. Examination head ears eyes and throat are marked. Chest and abdomen are benign. Extremities are free from injury or deformity.  Assessment/Plan Lumbar stenosis with neurogenic claudication. Plan L3-4, L4-5, L5-S1 decompressive laminectomy with foraminotomies. Risks and  benefits of been explained. Patient wishes to proceed.  Moyinoluwa Dawe A 02/12/2017, 11:17 AM

## 2017-02-12 NOTE — Transfer of Care (Signed)
Immediate Anesthesia Transfer of Care Note  Patient: Stephen Wilkinson  Procedure(s) Performed: Procedure(s): Lumbar Three-Four, Lumbar Four-Five, Lumbar Five-Sacral One Laminectomy and Foraminotomy (N/A)  Patient Location: PACU  Anesthesia Type:General  Level of Consciousness: awake, alert  and oriented  Airway & Oxygen Therapy: Patient Spontanous Breathing and Patient connected to nasal cannula oxygen  Post-op Assessment: Report given to RN and Post -op Vital signs reviewed and stable  Post vital signs: Reviewed and stable  Last Vitals:  Vitals:   02/12/17 0851 02/12/17 1415  BP: 120/61 (!) 147/69  Pulse: (!) 59 71  Resp: 18 (!) 8  Temp: 36.8 C (!) 36.3 C  SpO2: 99% 100%    Last Pain:  Vitals:   02/12/17 0910  TempSrc:   PainSc: 5       Patients Stated Pain Goal: 2 (68/34/19 6222)  Complications: No apparent anesthesia complications

## 2017-02-13 DIAGNOSIS — M4727 Other spondylosis with radiculopathy, lumbosacral region: Secondary | ICD-10-CM | POA: Diagnosis not present

## 2017-02-13 DIAGNOSIS — M4807 Spinal stenosis, lumbosacral region: Secondary | ICD-10-CM | POA: Diagnosis not present

## 2017-02-13 DIAGNOSIS — M4726 Other spondylosis with radiculopathy, lumbar region: Secondary | ICD-10-CM | POA: Diagnosis not present

## 2017-02-13 DIAGNOSIS — N529 Male erectile dysfunction, unspecified: Secondary | ICD-10-CM | POA: Diagnosis not present

## 2017-02-13 DIAGNOSIS — F0390 Unspecified dementia without behavioral disturbance: Secondary | ICD-10-CM | POA: Diagnosis not present

## 2017-02-13 DIAGNOSIS — M48062 Spinal stenosis, lumbar region with neurogenic claudication: Secondary | ICD-10-CM | POA: Diagnosis not present

## 2017-02-13 MED ORDER — HYDROCODONE-ACETAMINOPHEN 5-325 MG PO TABS: 1.0000 | ORAL_TABLET | ORAL | 0 refills | Status: DC | PRN

## 2017-02-13 NOTE — Progress Notes (Signed)
Patient noted to be very agitated and threatened to leave because he wanted to go downstairs and wait for his wife at the lobby. Staff explained to patient that wife will pick him up in his room and that staff will call wife at home to come pick him up. Patient became very upset walking up and down hallway trying to find the exit. Security called and was able to get patient back to his room but patient kept insisting on going down to the lobby to wait for wife. There are no discharge orders at this time and this was explained to patient. Writer took patient down to the lobby twice to show him that wife has not yet arrived. Security was with Probation officer the first time. Several attempts made by staff to call wife on her cell phone and home phone without success. Writer finally got in touch with wife and Probation officer took patient downstairs to the lobby to meet wife. Patient a little calm now but still anxious. Scheduled Xanax given with no relief at this time. Will continue to monitor.

## 2017-02-13 NOTE — Progress Notes (Signed)
Patient is discharged from room 3C05 at this time. Alert and in still anxious. Instructions read to patient and wife with understanding verbalized. Left unit via wheelchair with wife and belongings at side.

## 2017-02-13 NOTE — Discharge Summary (Signed)
Physician Discharge Summary  Patient ID: Stephen Wilkinson MRN: [id number] DOB/AGE: May 17, 1929 83 y.o.  Admit date: 02/12/2017 Discharge date: 02/13/2017  Admission Diagnoses:L3-4, L4-5 and L5-S1 spinal stenosis, neurogenic claudication, lumbar radiculopathy, lumbago, dementia  Discharge Diagnoses: The same Active Problems:   Lumbar stenosis with neurogenic claudication   Discharged Condition: good  Hospital Course: Dr. Annette Stable performed at L3-4, L4-5 and L5-S1 laminectomy/laminotomy/foraminotomy on the patient on 02/12/2017. The surgery went well.  The patient's postoperative course was remarkable only for an exacerbation of his paresis and dementia. On postoperative day #1 the patient, his wife, requested discharge to home. They were given oral and written discharge instructions. All their questions were answered.  Consults: None Significant Diagnostic Studies: None Treatments: L3-4, L4-5 and L5-S1 laminectomy/laminotomy/foraminotomy Discharge Exam: Blood pressure (!) 141/69, pulse 61, temperature 98.8 F (37.1 C), temperature source Oral, resp. rate 16, height 6\' 1"  (1.854 m), weight 88 kg (194 lb), SpO2 100 %. The patient is alert and pleasant. He is ambulating and moving all 4 extremities well.  Disposition: Home  Discharge Instructions    Call MD for:  difficulty breathing, headache or visual disturbances    Complete by:  As directed    Call MD for:  extreme fatigue    Complete by:  As directed    Call MD for:  hives    Complete by:  As directed    Call MD for:  persistant dizziness or light-headedness    Complete by:  As directed    Call MD for:  persistant nausea and vomiting    Complete by:  As directed    Call MD for:  redness, tenderness, or signs of infection (pain, swelling, redness, odor or green/yellow discharge around incision site)    Complete by:  As directed    Call MD for:  severe uncontrolled pain    Complete by:  As directed    Call MD for:  temperature >100.4     Complete by:  As directed    Diet - low sodium heart healthy    Complete by:  As directed    Discharge instructions    Complete by:  As directed    Call [phone] for a followup appointment. Take a stool softener while you are using pain medications.   Driving Restrictions    Complete by:  As directed    Do not drive for 2 weeks.   Increase activity slowly    Complete by:  As directed    Lifting restrictions    Complete by:  As directed    Do not lift more than 5 pounds. No excessive bending or twisting.   May shower / Bathe    Complete by:  As directed    He may shower after the pain she is removed 3 days after surgery. Leave the incision alone.   Remove dressing in 48 hours    Complete by:  As directed    Your stitches are under the scan and will dissolve by themselves. The Steri-Strips will fall off after you take a few showers. Do not rub back or pick at the wound, Leave the wound alone.     Allergies as of 02/13/2017   No Known Allergies     Medication List    STOP taking these medications   mupirocin nasal ointment 2 % Commonly known as:  BACTROBAN     TAKE these medications   ALPRAZolam 0.25 MG tablet Commonly known as:  XANAX Take 1 tablet (0.25 mg  total) by mouth 2 (two) times daily.   amLODipine 5 MG tablet Commonly known as:  NORVASC Take 1 tablet (5 mg total) by mouth every morning.   aspirin 81 MG tablet Take 81 mg by mouth daily.   AVASTIN IV Place 1 drop into the right eye as directed. Every 6-8 weeks   clobetasol cream 0.05 % Commonly known as:  TEMOVATE Apply 1 application topically daily as needed (for contact dermatitis). Contact dermatitis   clopidogrel 75 MG tablet Commonly known as:  PLAVIX Take 1 tablet (75 mg total) by mouth daily.   famotidine 20 MG tablet Commonly known as:  PEPCID Take 1 tablet (20 mg total) by mouth 2 (two) times daily.   glucose blood test strip Commonly known as:  ONE TOUCH ULTRA TEST Use as directed once  daily to check blood sugar.  Diagnosis code E11.42   HYDROcodone-acetaminophen 5-325 MG tablet Commonly known as:  NORCO/VICODIN Take 1-2 tablets by mouth every 4 (four) hours as needed (breakthrough pain).   ICAPS AREDS 2 PO Take 2 capsules by mouth daily.   latanoprost 0.005 % ophthalmic solution Commonly known as:  XALATAN Place 1 drop into both eyes at bedtime.   levothyroxine 100 MCG tablet Commonly known as:  SYNTHROID, LEVOTHROID Take 157mcg by mouth on Sunday, Tuesday, Thursday, and Saturday. Take 1.5tablets on Monday Wednesday, and Friday   metFORMIN 1000 MG tablet Commonly known as:  GLUCOPHAGE Take 1 tablet (1,000 mg total) by mouth daily.   metoprolol succinate 25 MG 24 hr tablet Commonly known as:  TOPROL-XL Take 1 tablet (25 mg total) by mouth every morning.   sildenafil 100 MG tablet Commonly known as:  VIAGRA Take 0.5-1 tablets (50-100 mg total) by mouth daily as needed for erectile dysfunction.   tiZANidine 4 MG tablet Commonly known as:  ZANAFLEX Take 1 tablet (4 mg total) by mouth every 6 (six) hours as needed for muscle spasms.            Discharge Care Instructions        Start     Ordered   02/13/17 0000  HYDROcodone-acetaminophen (NORCO/VICODIN) 5-325 MG tablet  Every 4 hours PRN     02/13/17 1051   02/13/17 0000  Discharge instructions    Comments:  Call [phone] for a followup appointment. Take a stool softener while you are using pain medications.   02/13/17 1051   02/13/17 0000  Increase activity slowly     02/13/17 1051   02/13/17 0000  May shower / Bathe    Comments:  He may shower after the pain she is removed 3 days after surgery. Leave the incision alone.   02/13/17 1051   02/13/17 0000  Driving Restrictions    Comments:  Do not drive for 2 weeks.   02/13/17 1051   02/13/17 0000  Lifting restrictions    Comments:  Do not lift more than 5 pounds. No excessive bending or twisting.   02/13/17 1051   02/13/17 0000  Diet - low  sodium heart healthy     02/13/17 1051   02/13/17 0000  Remove dressing in 48 hours    Comments:  Your stitches are under the scan and will dissolve by themselves. The Steri-Strips will fall off after you take a few showers. Do not rub back or pick at the wound, Leave the wound alone.   02/13/17 1051   02/13/17 0000  Call MD for:  temperature >100.4     09 /01/18 1051  02/13/17 0000  Call MD for:  persistant nausea and vomiting     02/13/17 1051   02/13/17 0000  Call MD for:  severe uncontrolled pain     02/13/17 1051   02/13/17 0000  Call MD for:  redness, tenderness, or signs of infection (pain, swelling, redness, odor or green/yellow discharge around incision site)     02/13/17 1051   02/13/17 0000  Call MD for:  difficulty breathing, headache or visual disturbances     02/13/17 1051   02/13/17 0000  Call MD for:  hives     02/13/17 1051   02/13/17 0000  Call MD for:  persistant dizziness or light-headedness     02/13/17 1051   02/13/17 0000  Call MD for:  extreme fatigue     02/13/17 1051       Signed: Charlize Hathaway D 02/13/2017, 10:52 AM

## 2017-02-13 NOTE — Care Management Note (Signed)
Case Management Note  Patient Details  Name: Stephen Wilkinson MRN: [id number] Date of Birth: Nov 13, 1928  Subjective/Objective:                 Patient with order to DC to home today. Chart reviewed. No Home Health or Equipment needs, no unacknowledged Case Management consults or medication needs identified at the time of this note. Plan for DC to home. If needs arise today prior to discharge, please call Carles Collet RN CM at [phone].    Action/Plan:   Expected Discharge Date:  02/13/17               Expected Discharge Plan:  Home/Self Care  In-House Referral:     Discharge planning Services  CM Consult  Post Acute Care Choice:    Choice offered to:     DME Arranged:    DME Agency:     HH Arranged:    HH Agency:     Status of Service:  Completed, signed off  If discussed at [employer] of Stay Meetings, dates discussed:    Additional Comments:  Carles Collet, RN 02/13/2017, 11:05 AM

## 2017-02-13 NOTE — Anesthesia Postprocedure Evaluation (Signed)
Anesthesia Post Note  Patient: Stephen Wilkinson  Procedure(s) Performed: Procedure(s) (LRB): Lumbar Three-Four, Lumbar Four-Five, Lumbar Five-Sacral One Laminectomy and Foraminotomy (N/A)     Patient location during evaluation: PACU Anesthesia Type: General Level of consciousness: awake and alert Pain management: pain level controlled Vital Signs Assessment: post-procedure vital signs reviewed and stable Respiratory status: spontaneous breathing, nonlabored ventilation, respiratory function stable and patient connected to nasal cannula oxygen Cardiovascular status: blood pressure returned to baseline and stable Postop Assessment: no signs of nausea or vomiting Anesthetic complications: no    Last Vitals:  Vitals:   02/13/17 0000 02/13/17 0600  BP: (!) 113/46 (!) 141/69  Pulse: 60 61  Resp: 16 16  Temp: 36.8 C 37.1 C  SpO2: 100% 100%    Last Pain:  Vitals:   02/13/17 1036  TempSrc:   PainSc: [place]

## 2017-02-16 ENCOUNTER — Encounter (HOSPITAL_COMMUNITY): Payer: Self-pay | Admitting: Neurosurgery

## 2017-02-17 NOTE — Progress Notes (Signed)
HPI: Follow-up hypertension and palpitations. Also history of TIA. Echo 2008 showed normal LV function; grade 1 DD; mild LVH; trace AI; mild MR and TR. Nuclear study March 2017 showed ejection fraction 62% and no ischemia. Since last seen, patient denies dyspnea, chest pain, palpitations or syncope. He had recent back surgery and has had some residual pain.  Current Outpatient Prescriptions  Medication Sig Dispense Refill  . ALPRAZolam (XANAX) 0.25 MG tablet Take 1 tablet (0.25 mg total) by mouth 2 (two) times daily. 180 tablet 1  . amLODipine (NORVASC) 5 MG tablet Take 1 tablet (5 mg total) by mouth every morning. 90 tablet 3  . aspirin 81 MG tablet Take 81 mg by mouth daily.    . Bevacizumab (AVASTIN IV) Place 1 drop into the right eye as directed. Every 6-8 weeks    . clobetasol cream (TEMOVATE) 0.35 % Apply 1 application topically daily as needed (for contact dermatitis). Contact dermatitis  3  . clopidogrel (PLAVIX) 75 MG tablet Take 1 tablet (75 mg total) by mouth daily. 90 tablet 3  . famotidine (PEPCID) 20 MG tablet Take 1 tablet (20 mg total) by mouth 2 (two) times daily. 180 tablet 3  . glucose blood (ONE TOUCH ULTRA TEST) test strip Use as directed once daily to check blood sugar.  Diagnosis code E11.42 100 each 11  . HYDROcodone-acetaminophen (NORCO/VICODIN) 5-325 MG tablet Take 1-2 tablets by mouth every 4 (four) hours as needed (breakthrough pain). 50 tablet 0  . latanoprost (XALATAN) 0.005 % ophthalmic solution Place 1 drop into both eyes at bedtime.     Marland Kitchen levothyroxine (SYNTHROID, LEVOTHROID) 100 MCG tablet Take 128mcg by mouth on Sunday, Tuesday, Thursday, and Saturday. Take 1.5tablets on Monday Wednesday, and Friday 115 tablet 3  . metFORMIN (GLUCOPHAGE) 1000 MG tablet Take 1 tablet (1,000 mg total) by mouth daily. 90 tablet 3  . metoprolol succinate (TOPROL-XL) 25 MG 24 hr tablet Take 1 tablet (25 mg total) by mouth every morning.    . Multiple Vitamins-Minerals (ICAPS  AREDS 2 PO) Take 2 capsules by mouth daily.    . sildenafil (VIAGRA) 100 MG tablet Take 0.5-1 tablets (50-100 mg total) by mouth daily as needed for erectile dysfunction. 8 tablet 11  . tiZANidine (ZANAFLEX) 4 MG tablet Take 1 tablet (4 mg total) by mouth every 6 (six) hours as needed for muscle spasms. 600 tablet 1   No current facility-administered medications for this visit.      Past Medical History:  Diagnosis Date  . Acute blood loss anemia 05/15/2011   post surgical  . Altered mental status 05/15/2011  . Anxiety   . B12 deficiency 09/23/2016  . Blood transfusion without reported diagnosis   . Cancer ([place])    basal and squamous skin carcinoma   . Cataract   . Chronic kidney disease    hx of uti due to catheter hx of prostatitis, BPH -   . Depression   . Diabetes mellitus   . GERD (gastroesophageal reflux disease)   . Glaucoma 01/04/2013  . Heart murmur   . History of nuclear stress test    Myoview 3/17: EF 62%, no ischemia or scar, low risk  . Hx of transient ischemic attack (TIA) 01/04/2013  . Hx: UTI (urinary tract infection)   . Hyperlipidemia   . Hypertension   . Hypokalemia 05/15/2011  . Hypothyroidism   . Insomnia   . Macular degeneration   . Neuropathy   . OA (osteoarthritis)   .  Pneumonia   . PVC's (premature ventricular contractions)   . Renal mass   . Stroke ([place])    mid 1990s     Past Surgical History:  Procedure Laterality Date  . APPENDECTOMY    . CATARACT EXTRACTION W/ INTRAOCULAR LENS  IMPLANT, BILATERAL    . INGUINAL HERNIA REPAIR     X3  . LUMBAR LAMINECTOMY/DECOMPRESSION MICRODISCECTOMY N/A 02/12/2017   Procedure: Lumbar Three-Four, Lumbar Four-Five, Lumbar Five-Sacral One Laminectomy and Foraminotomy;  Surgeon: Earnie Larsson, MD;  Location: [facility];  Service: Neurosurgery;  Laterality: N/A;  . OTHER SURGICAL HISTORY     microwave procedure for prostate - 09/12   . PROSTATE ABLATION     microwave  . REPLACEMENT TOTAL KNEE    . THYROIDECTOMY      . TONSILLECTOMY AND ADENOIDECTOMY    . TOTAL KNEE ARTHROPLASTY  05/11/2011   Procedure: TOTAL KNEE ARTHROPLASTY;  Surgeon: Gearlean Alf;  Location: [facility];  Service: Orthopedics;  Laterality: Right;  . US ECHOCARDIOGRAPHY  11/10/2006   EF 55-60%  . US ECHOCARDIOGRAPHY  09/20/2002   EF 65-70%    Social History   Social History  . Marital status: Married    Spouse name: N/A  . Number of children: 3  . Years of education: N/A   Occupational History  . 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 Smoking status: Never Smoker  . Smokeless tobacco: Never Used  . Alcohol use 4.2 oz/week    7 Shots of liquor per week     Comment: 2 drinks most days  . Drug use: No  . Sexual activity: No   Other Topics Concern  . 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 Hypertension Mother   . Arthritis Mother   . Diabetes Mother   . Hypertension Father   . Stroke Father   . Kidney disease Father   . Hypertension Brother   . Diabetes Brother     ROS: Some back pain but no fevers or chills, productive cough, hemoptysis, dysphasia, odynophagia, melena, hematochezia, dysuria, hematuria, rash, seizure activity, orthopnea, PND, pedal edema, claudication. Remaining systems are negative.  Physical Exam: Well-developed well-nourished in no acute distress.  Skin is warm and dry.  HEENT is normal.  Neck is supple.  Chest is clear to auscultation with normal expansion.  Cardiovascular exam is regular rate and rhythm.  Abdominal exam nontender or distended. No masses palpated. Extremities show no edema. neuro grossly intact  ECG- Sinus rhythm at a rate of 59. Septal infarct. personally reviewed  A/P  1  Hypertension-blood pressure is controlled. Continue present medications.  2 hyperlipidemia-managed by primary care.  3 history of TIA-continue aspirin and Plavix.  Kirk Ruths, MD

## 2017-02-23 ENCOUNTER — Ambulatory Visit (INDEPENDENT_AMBULATORY_CARE_PROVIDER_SITE_OTHER): Payer: Medicare Other | Admitting: Cardiology

## 2017-02-23 ENCOUNTER — Encounter: Payer: Self-pay | Admitting: Cardiology

## 2017-02-23 VITALS — BP 104/64 | HR 59 | Ht 73.5 in | Wt 182.8 lb

## 2017-02-23 DIAGNOSIS — R002 Palpitations: Secondary | ICD-10-CM | POA: Diagnosis not present

## 2017-02-23 DIAGNOSIS — I119 Hypertensive heart disease without heart failure: Secondary | ICD-10-CM | POA: Diagnosis not present

## 2017-02-23 MED ORDER — AMLODIPINE BESYLATE 5 MG PO TABS: 5.0000 mg | ORAL_TABLET | ORAL | 3 refills | Status: DC

## 2017-02-23 MED ORDER — CLOPIDOGREL BISULFATE 75 MG PO TABS
75.0000 mg | ORAL_TABLET | Freq: Every day | ORAL | 3 refills | Status: DC
Start: 2017-02-23 — End: 2017-09-07

## 2017-02-23 MED ORDER — METOPROLOL SUCCINATE ER 25 MG PO TB24: 25.0000 mg | ORAL_TABLET | ORAL | 3 refills | Status: DC

## 2017-02-23 NOTE — Patient Instructions (Signed)
Your physician wants you to follow-up in: 6 months with Dr.Crenshaw. You will receive a reminder letter in the mail two months in advance. If you Dionne't receive a letter, please call our office to schedule the follow-up appointment.  

## 2017-03-11 DIAGNOSIS — H353124 Nonexudative age-related macular degeneration, left eye, advanced atrophic with subfoveal involvement: Secondary | ICD-10-CM | POA: Diagnosis not present

## 2017-03-11 DIAGNOSIS — H353211 Exudative age-related macular degeneration, right eye, with active choroidal neovascularization: Secondary | ICD-10-CM | POA: Diagnosis not present

## 2017-03-11 DIAGNOSIS — H43391 Other vitreous opacities, right eye: Secondary | ICD-10-CM | POA: Diagnosis not present

## 2017-03-11 DIAGNOSIS — H353113 Nonexudative age-related macular degeneration, right eye, advanced atrophic without subfoveal involvement: Secondary | ICD-10-CM | POA: Diagnosis not present

## 2017-03-22 ENCOUNTER — Telehealth: Payer: Self-pay

## 2017-03-22 ENCOUNTER — Other Ambulatory Visit: Payer: Medicare Other

## 2017-03-22 DIAGNOSIS — E039 Hypothyroidism, unspecified: Secondary | ICD-10-CM

## 2017-03-22 DIAGNOSIS — E786 Lipoprotein deficiency: Secondary | ICD-10-CM

## 2017-03-22 DIAGNOSIS — Z23 Encounter for immunization: Secondary | ICD-10-CM | POA: Diagnosis not present

## 2017-03-22 DIAGNOSIS — E1142 Type 2 diabetes mellitus with diabetic polyneuropathy: Secondary | ICD-10-CM

## 2017-03-22 NOTE — Telephone Encounter (Signed)
Patient came in to do labs before appt on Friday 10/12---dr john not in the office, i've ordered labs as best as I can for a 6 mo follow up---I have advised patient that I dont know if insurance will cover labs not being attached to office visit

## 2017-03-22 NOTE — Telephone Encounter (Signed)
error 

## 2017-03-24 ENCOUNTER — Ambulatory Visit: Payer: Medicare Other | Admitting: Podiatry

## 2017-03-25 ENCOUNTER — Ambulatory Visit (INDEPENDENT_AMBULATORY_CARE_PROVIDER_SITE_OTHER): Payer: Medicare Other | Admitting: Internal Medicine

## 2017-03-25 ENCOUNTER — Encounter: Payer: Self-pay | Admitting: Internal Medicine

## 2017-03-25 VITALS — BP 136/84 | HR 78 | Temp 98.4°F | Ht 73.5 in | Wt 182.0 lb

## 2017-03-25 DIAGNOSIS — E1142 Type 2 diabetes mellitus with diabetic polyneuropathy: Secondary | ICD-10-CM

## 2017-03-25 DIAGNOSIS — F419 Anxiety disorder, unspecified: Secondary | ICD-10-CM

## 2017-03-25 DIAGNOSIS — F329 Major depressive disorder, single episode, unspecified: Secondary | ICD-10-CM | POA: Diagnosis not present

## 2017-03-25 DIAGNOSIS — E7800 Pure hypercholesterolemia, unspecified: Secondary | ICD-10-CM | POA: Diagnosis not present

## 2017-03-25 DIAGNOSIS — E034 Atrophy of thyroid (acquired): Secondary | ICD-10-CM

## 2017-03-25 MED ORDER — MUPIROCIN 2 % EX OINT
1.0000 | TOPICAL_OINTMENT | Freq: Two times a day (BID) | CUTANEOUS | 0 refills | Status: DC
Start: 2017-03-25 — End: 2017-09-07

## 2017-03-25 MED ORDER — ALPRAZOLAM 0.25 MG PO TABS: 0.2500 mg | ORAL_TABLET | Freq: Two times a day (BID) | ORAL | 1 refills | Status: DC

## 2017-03-25 MED ORDER — LEVOTHYROXINE SODIUM 100 MCG PO TABS: ORAL_TABLET | ORAL | 3 refills | Status: DC

## 2017-03-25 MED ORDER — FAMOTIDINE 20 MG PO TABS: 20.0000 mg | ORAL_TABLET | Freq: Two times a day (BID) | ORAL | 3 refills | Status: DC

## 2017-03-25 MED ORDER — METFORMIN HCL 1000 MG PO TABS: 1000.0000 mg | ORAL_TABLET | Freq: Every day | ORAL | 3 refills | Status: DC

## 2017-03-25 NOTE — Assessment & Plan Note (Signed)
stable overall by history and exam, recent data reviewed with pt, and pt to continue medical treatment as before,  to f/u any worsening symptoms or concerns Lab Results  Component Value Date   HGBA1C 6.3 (H) 02/05/2017

## 2017-03-25 NOTE — Assessment & Plan Note (Signed)
stable overall by history and exam, recent data reviewed with pt, and pt to continue medical treatment as before,  to f/u any worsening symptoms or concerns Lab Results  Component Value Date   LDLCALC 99 09/23/2016

## 2017-03-25 NOTE — Progress Notes (Signed)
Subjective:    Patient ID: Stephen Wilkinson, male    DOB: [birth year], 83 y.o.   MRN: [id number]  HPI  Here to f/u; overall doing ok,  Pt denies chest pain, increasing sob or doe, wheezing, orthopnea, PND, increased LE swelling, palpitations, dizziness or syncope.  Pt denies new neurological symptoms such as new headache, or facial or extremity weakness or numbness.  Pt denies polydipsia, polyuria, or low sugar episode.  Pt states overall good compliance with meds, mostly trying to follow appropriate diet, with wt overall stable,  but little exercise however.  Jittery, nervous worried about the thyroid testing.  Back pain resolved and very happy with results of aug 31 back surgury.  Only real complaint is seems to be steadily losing his central vision due to macular degeneration. Needs some med refills.  Has already has labs aug 2018.    Was found MRSA + preop but does not think he was treated. Denies worsening reflux, abd pain, dysphagia, n/v, bowel change or blood. Denies hyper or hypo thyroid symptoms such as voice, skin or hair change.  Past Medical History:  Diagnosis Date  . Acute blood loss anemia 05/15/2011   post surgical  . Altered mental status 05/15/2011  . Anxiety   . B12 deficiency 09/23/2016  . Blood transfusion without reported diagnosis   . Cancer ([place])    basal and squamous skin carcinoma   . Cataract   . Chronic kidney disease    hx of uti due to catheter hx of prostatitis, BPH -   . Depression   . Diabetes mellitus   . GERD (gastroesophageal reflux disease)   . Glaucoma 01/04/2013  . Heart murmur   . History of nuclear stress test    Myoview 3/17: EF 62%, no ischemia or scar, low risk  . Hx of transient ischemic attack (TIA) 01/04/2013  . Hx: UTI (urinary tract infection)   . Hyperlipidemia   . Hypertension   . Hypokalemia 05/15/2011  . Hypothyroidism   . Insomnia   . Macular degeneration   . Neuropathy   . OA (osteoarthritis)   . Pneumonia   . PVC's (premature  ventricular contractions)   . Renal mass   . Stroke ([place])    mid 1990s    Past Surgical History:  Procedure Laterality Date  . APPENDECTOMY    . CATARACT EXTRACTION W/ INTRAOCULAR LENS  IMPLANT, BILATERAL    . INGUINAL HERNIA REPAIR     X3  . LUMBAR LAMINECTOMY/DECOMPRESSION MICRODISCECTOMY N/A 02/12/2017   Procedure: Lumbar Three-Four, Lumbar Four-Five, Lumbar Five-Sacral One Laminectomy and Foraminotomy;  Surgeon: Earnie Larsson, MD;  Location: [facility];  Service: Neurosurgery;  Laterality: N/A;  . OTHER SURGICAL HISTORY     microwave procedure for prostate - 09/12   . PROSTATE ABLATION     microwave  . REPLACEMENT TOTAL KNEE    . THYROIDECTOMY    . TONSILLECTOMY AND ADENOIDECTOMY    . TOTAL KNEE ARTHROPLASTY  05/11/2011   Procedure: TOTAL KNEE ARTHROPLASTY;  Surgeon: Gearlean Alf;  Location: [facility];  Service: Orthopedics;  Laterality: Right;  . US ECHOCARDIOGRAPHY  11/10/2006   EF 55-60%  . US ECHOCARDIOGRAPHY  09/20/2002   EF 65-70%    reports that he has never smoked. He has never used smokeless tobacco. He reports that he drinks about 4.2 oz of alcohol per week . He reports that he does not use drugs. family history includes Arthritis in his mother; Diabetes in his brother and mother;  Hypertension in his brother, father, and mother; Kidney disease in his father; Stroke in his father. No Known Allergies Current Outpatient Prescriptions on File Prior to Visit  Medication Sig Dispense Refill  . amLODipine (NORVASC) 5 MG tablet Take 1 tablet (5 mg total) by mouth every morning. 90 tablet 3  . aspirin 81 MG tablet Take 81 mg by mouth daily.    . Bevacizumab (AVASTIN IV) Place 1 drop into the right eye as directed. Every 6-8 weeks    . clobetasol cream (TEMOVATE) 1.19 % Apply 1 application topically daily as needed (for contact dermatitis). Contact dermatitis  3  . clopidogrel (PLAVIX) 75 MG tablet Take 1 tablet (75 mg total) by mouth daily. 90 tablet 3  . glucose blood (ONE TOUCH ULTRA  TEST) test strip Use as directed once daily to check blood sugar.  Diagnosis code E11.42 100 each 11  . HYDROcodone-acetaminophen (NORCO/VICODIN) 5-325 MG tablet Take 1-2 tablets by mouth every 4 (four) hours as needed (breakthrough pain). 50 tablet 0  . latanoprost (XALATAN) 0.005 % ophthalmic solution Place 1 drop into both eyes at bedtime.     . metoprolol succinate (TOPROL-XL) 25 MG 24 hr tablet Take 1 tablet (25 mg total) by mouth every morning. 90 tablet 3  . Multiple Vitamins-Minerals (ICAPS AREDS 2 PO) Take 2 capsules by mouth daily.    . sildenafil (VIAGRA) 100 MG tablet Take 0.5-1 tablets (50-100 mg total) by mouth daily as needed for erectile dysfunction. 8 tablet 11  . tiZANidine (ZANAFLEX) 4 MG tablet Take 1 tablet (4 mg total) by mouth every 6 (six) hours as needed for muscle spasms. 600 tablet 1  . [DISCONTINUED] warfarin (COUMADIN) 3 MG tablet Take 1 tablet (3 mg total) by mouth one time only at 6 PM. 35 tablet 0   No current facility-administered medications on file prior to visit.    Review of Systems  Constitutional: Negative for other unusual diaphoresis or sweats HENT: Negative for ear discharge or swelling Eyes: Negative for other worsening visual disturbances Respiratory: Negative for stridor or other swelling  Gastrointestinal: Negative for worsening distension or other blood Genitourinary: Negative for retention or other urinary change Musculoskeletal: Negative for other MSK pain or swelling Skin: Negative for color change or other new lesions Neurological: Negative for worsening tremors and other numbness  Psychiatric/Behavioral: Negative for worsening agitation or other fatigue All other system neg per pt    Objective:   Physical Exam  BP 136/84   Pulse 78   Temp 98.4 F (36.9 C) (Oral)   Ht 6' 1.5" (1.867 m)   Wt 182 lb (82.6 kg)   SpO2 97%   BMI 23.69 kg/m  VS noted, not ill appearing Constitutional: Pt appears in NAD HENT: Head: NCAT.  Right Ear:  External ear normal.  Left Ear: External ear normal.  Eyes: . Pupils are equal, round, and reactive to light. Conjunctivae and EOM are normal Nose: without d/c or deformity Neck: Neck supple. Gross normal ROM Cardiovascular: Normal rate and regular rhythm.   Pulmonary/Chest: Effort normal and breath sounds without rales or wheezing.  Abd:  Soft, NT, ND, + BS, no organomegaly Neurological: Pt is alert. At baseline orientation, motor grossly intact Skin: Skin is warm. No rashes, other new lesions, no LE edema Psychiatric: Pt behavior is normal without agitation , 2+ nervous No other exam findings Lab Results  Component Value Date   WBC 6.6 02/05/2017   HGB 14.2 02/05/2017   HCT 42.9 02/05/2017  PLT 213 02/05/2017   GLUCOSE 116 (H) 02/05/2017   CHOL 182 09/23/2016   TRIG 54.0 09/23/2016   HDL 72.20 09/23/2016   LDLCALC 99 09/23/2016   ALT 13 09/23/2016   AST 17 09/23/2016   NA 140 02/05/2017   K 4.4 02/05/2017   CL 106 02/05/2017   CREATININE 1.11 02/05/2017   BUN 16 02/05/2017   CO2 23 02/05/2017   TSH 0.36 10/20/2016   INR 2.16 (H) 05/15/2011   HGBA1C 6.3 (H) 02/05/2017   MICROALBUR 0.4 09/23/2016      Assessment & Plan:

## 2017-03-25 NOTE — Patient Instructions (Signed)
Please take all new medication as prescribed - the nasal ointment treatment for the MRSA  Please continue all other medications as before, and refills have been done if requested.  Please have the pharmacy call with any other refills you may need.  Please continue your efforts at being more active, low cholesterol diet, and weight control.  Please keep your appointments with your specialists as you may have planned  Please return in 6 months, or sooner if needed, with Lab testing done 3-5 days before

## 2017-03-25 NOTE — Assessment & Plan Note (Signed)
With marked anxiety, has tried mult meds in past, declines ssri or snri trial or other such as abilfy, cont same tx, declines counseling referral,  to f/u any worsening symptoms or concerns

## 2017-03-25 NOTE — Assessment & Plan Note (Signed)
stable overall by history and exam, recent data reviewed with pt, and pt to continue medical treatment as before,  to f/u any worsening symptoms or concerns Lab Results  Component Value Date   TSH 0.36 10/20/2016

## 2017-04-14 ENCOUNTER — Ambulatory Visit (INDEPENDENT_AMBULATORY_CARE_PROVIDER_SITE_OTHER): Payer: Medicare Other | Admitting: Podiatry

## 2017-04-14 ENCOUNTER — Encounter: Payer: Self-pay | Admitting: Podiatry

## 2017-04-14 DIAGNOSIS — B351 Tinea unguium: Secondary | ICD-10-CM

## 2017-04-14 DIAGNOSIS — M79676 Pain in unspecified toe(s): Secondary | ICD-10-CM

## 2017-04-14 NOTE — Progress Notes (Signed)
Patient ID: Stephen Wilkinson, male   DOB: March 23, 1929, 83 y.o.   MRN: [id number] Complaint:  Visit Type: Patient returns to my office for continued preventative foot care services. Complaint: Patient states" my nails have grown long and thick and become painful to walk and wear shoes" Patient has been diagnosed with DM with no foot complications. The patient presents for preventative foot care services. No changes to ROS.  He has macular degeneration with history of mother and grandmother losing their legs to diabetes.  Podiatric Exam: Vascular: dorsalis pedis and posterior tibial pulses are palpable bilateral. Capillary return is immediate. Temperature gradient is WNL. Skin turgor WNL  Sensorium: Normal Semmes Weinstein monofilament test. Normal tactile sensation bilaterally. Nail Exam: Pt has thick disfigured discolored nails with subungual debris noted bilateral entire nail hallux through fifth toenails Ulcer Exam: There is no evidence of ulcer or pre-ulcerative changes or infection. Orthopedic Exam: Muscle tone and strength are WNL. No limitations in general ROM. No crepitus or effusions noted. Foot type and digits show no abnormalities. Bony prominences are unremarkable. HAV B/L. Skin: . No infection or ulcers.Asymptomatic porokeratosis sub 5th right.  Diagnosis:  Onychomycosis, , Pain in right toe, pain in left toes,  Treatment & Plan Procedures and Treatment: Consent by patient was obtained for treatment procedures. The patient understood the discussion of treatment and procedures well. All questions were answered thoroughly reviewed. Debridement of mycotic and hypertrophic toenails, 1 through 5 bilateral and clearing of subungual debris. No ulceration, no infection noted. ABN signed for 2018 Return Visit-Office Procedure: Patient instructed to return to the office for a follow up visit 10 weeks  for continued evaluation and treatment.  Gardiner Barefoot DPM

## 2017-04-21 DIAGNOSIS — H353113 Nonexudative age-related macular degeneration, right eye, advanced atrophic without subfoveal involvement: Secondary | ICD-10-CM | POA: Diagnosis not present

## 2017-04-21 DIAGNOSIS — H353124 Nonexudative age-related macular degeneration, left eye, advanced atrophic with subfoveal involvement: Secondary | ICD-10-CM | POA: Diagnosis not present

## 2017-04-21 DIAGNOSIS — H353211 Exudative age-related macular degeneration, right eye, with active choroidal neovascularization: Secondary | ICD-10-CM | POA: Diagnosis not present

## 2017-04-21 DIAGNOSIS — H43391 Other vitreous opacities, right eye: Secondary | ICD-10-CM | POA: Diagnosis not present

## 2017-04-21 DIAGNOSIS — H43822 Vitreomacular adhesion, left eye: Secondary | ICD-10-CM | POA: Diagnosis not present

## 2017-05-26 DIAGNOSIS — H353211 Exudative age-related macular degeneration, right eye, with active choroidal neovascularization: Secondary | ICD-10-CM | POA: Diagnosis not present

## 2017-05-26 DIAGNOSIS — H353113 Nonexudative age-related macular degeneration, right eye, advanced atrophic without subfoveal involvement: Secondary | ICD-10-CM | POA: Diagnosis not present

## 2017-05-26 DIAGNOSIS — H353124 Nonexudative age-related macular degeneration, left eye, advanced atrophic with subfoveal involvement: Secondary | ICD-10-CM | POA: Diagnosis not present

## 2017-05-26 DIAGNOSIS — H35372 Puckering of macula, left eye: Secondary | ICD-10-CM | POA: Diagnosis not present

## 2017-05-26 DIAGNOSIS — H43822 Vitreomacular adhesion, left eye: Secondary | ICD-10-CM | POA: Diagnosis not present

## 2017-06-23 ENCOUNTER — Ambulatory Visit (INDEPENDENT_AMBULATORY_CARE_PROVIDER_SITE_OTHER): Payer: Medicare Other | Admitting: Podiatry

## 2017-06-23 ENCOUNTER — Encounter: Payer: Self-pay | Admitting: Podiatry

## 2017-06-23 DIAGNOSIS — E1140 Type 2 diabetes mellitus with diabetic neuropathy, unspecified: Secondary | ICD-10-CM

## 2017-06-23 DIAGNOSIS — B351 Tinea unguium: Secondary | ICD-10-CM

## 2017-06-23 DIAGNOSIS — M79676 Pain in unspecified toe(s): Secondary | ICD-10-CM | POA: Diagnosis not present

## 2017-06-23 DIAGNOSIS — M216X9 Other acquired deformities of unspecified foot: Secondary | ICD-10-CM

## 2017-06-23 NOTE — Progress Notes (Signed)
Patient ID: Stephen Wilkinson, male   DOB: [birth year], 84 y.o.   MRN: [id number] Complaint:  Visit Type: Patient returns to my office for continued preventative foot care services. Complaint: Patient states" my nails have grown long and thick and become painful to walk and wear shoes" Patient has been diagnosed with DM with no foot complications. The patient presents for preventative foot care services. No changes to ROS.  He has macular degeneration with history of mother and grandmother losing their legs to diabetes.  Podiatric Exam: Vascular: dorsalis pedis and posterior tibial pulses are palpable bilateral. Capillary return is immediate. Temperature gradient is WNL. Skin turgor WNL  Sensorium: Normal Semmes Weinstein monofilament test. Normal tactile sensation bilaterally. Nail Exam: Pt has thick disfigured discolored nails with subungual debris noted bilateral entire nail hallux through fifth toenails Ulcer Exam: There is no evidence of ulcer or pre-ulcerative changes or infection. Orthopedic Exam: Muscle tone and strength are WNL. No limitations in general ROM. No crepitus or effusions noted. Foot type and digits show no abnormalities. Bony prominences are unremarkable. HAV B/L. Skin: . No infection or ulcers.Asymptomatic porokeratosis sub 5th right.  Diagnosis:  Onychomycosis, , Pain in right toe, pain in left toes,  Treatment & Plan Procedures and Treatment: Consent by patient was obtained for treatment procedures. The patient understood the discussion of treatment and procedures well. All questions were answered thoroughly reviewed. Debridement of mycotic and hypertrophic toenails, 1 through 5 bilateral and clearing of subungual debris. No ulceration, no infection noted. ABN signed for 2019. Return Visit-Office Procedure: Patient instructed to return to the office for a follow up visit 10 weeks  for continued evaluation and treatment.  Gardiner Barefoot DPM

## 2017-06-29 DIAGNOSIS — L570 Actinic keratosis: Secondary | ICD-10-CM | POA: Diagnosis not present

## 2017-06-29 DIAGNOSIS — C4441 Basal cell carcinoma of skin of scalp and neck: Secondary | ICD-10-CM | POA: Diagnosis not present

## 2017-06-29 DIAGNOSIS — L821 Other seborrheic keratosis: Secondary | ICD-10-CM | POA: Diagnosis not present

## 2017-06-29 DIAGNOSIS — D044 Carcinoma in situ of skin of scalp and neck: Secondary | ICD-10-CM | POA: Diagnosis not present

## 2017-06-29 DIAGNOSIS — D225 Melanocytic nevi of trunk: Secondary | ICD-10-CM | POA: Diagnosis not present

## 2017-06-29 DIAGNOSIS — X32XXXD Exposure to sunlight, subsequent encounter: Secondary | ICD-10-CM | POA: Diagnosis not present

## 2017-06-30 DIAGNOSIS — H353113 Nonexudative age-related macular degeneration, right eye, advanced atrophic without subfoveal involvement: Secondary | ICD-10-CM | POA: Diagnosis not present

## 2017-06-30 DIAGNOSIS — H35372 Puckering of macula, left eye: Secondary | ICD-10-CM | POA: Diagnosis not present

## 2017-06-30 DIAGNOSIS — H43822 Vitreomacular adhesion, left eye: Secondary | ICD-10-CM | POA: Diagnosis not present

## 2017-06-30 DIAGNOSIS — H353211 Exudative age-related macular degeneration, right eye, with active choroidal neovascularization: Secondary | ICD-10-CM | POA: Diagnosis not present

## 2017-06-30 DIAGNOSIS — H353124 Nonexudative age-related macular degeneration, left eye, advanced atrophic with subfoveal involvement: Secondary | ICD-10-CM | POA: Diagnosis not present

## 2017-07-28 DIAGNOSIS — H35372 Puckering of macula, left eye: Secondary | ICD-10-CM | POA: Diagnosis not present

## 2017-07-28 DIAGNOSIS — H43822 Vitreomacular adhesion, left eye: Secondary | ICD-10-CM | POA: Diagnosis not present

## 2017-08-04 DIAGNOSIS — H3570 Unspecified separation of retinal layers: Secondary | ICD-10-CM | POA: Diagnosis not present

## 2017-08-04 DIAGNOSIS — H43822 Vitreomacular adhesion, left eye: Secondary | ICD-10-CM | POA: Diagnosis not present

## 2017-08-04 DIAGNOSIS — H353113 Nonexudative age-related macular degeneration, right eye, advanced atrophic without subfoveal involvement: Secondary | ICD-10-CM | POA: Diagnosis not present

## 2017-08-04 DIAGNOSIS — H353211 Exudative age-related macular degeneration, right eye, with active choroidal neovascularization: Secondary | ICD-10-CM | POA: Diagnosis not present

## 2017-08-10 DIAGNOSIS — Z08 Encounter for follow-up examination after completed treatment for malignant neoplasm: Secondary | ICD-10-CM | POA: Diagnosis not present

## 2017-08-10 DIAGNOSIS — Z85828 Personal history of other malignant neoplasm of skin: Secondary | ICD-10-CM | POA: Diagnosis not present

## 2017-08-10 DIAGNOSIS — C44212 Basal cell carcinoma of skin of right ear and external auricular canal: Secondary | ICD-10-CM | POA: Diagnosis not present

## 2017-08-30 DIAGNOSIS — E119 Type 2 diabetes mellitus without complications: Secondary | ICD-10-CM | POA: Diagnosis not present

## 2017-08-30 DIAGNOSIS — H524 Presbyopia: Secondary | ICD-10-CM | POA: Diagnosis not present

## 2017-08-30 LAB — HM DIABETES EYE EXAM

## 2017-08-30 NOTE — Progress Notes (Signed)
HPI: Follow-up hypertension and palpitations. Also history of TIA. Echo 2008 showed normal LV function; grade 1 DD; mild LVH; trace AI; mild MR and TR. Nuclear study March 2017 showed ejection fraction 62% and no ischemia. Since last seen,  patient denies dyspnea, chest pain, palpitations or syncope.  Current Outpatient Medications  Medication Sig Dispense Refill  . ALPRAZolam (XANAX) 0.25 MG tablet Take 1 tablet (0.25 mg total) by mouth 2 (two) times daily. 180 tablet 1  . amLODipine (NORVASC) 5 MG tablet Take 1 tablet (5 mg total) by mouth every morning. 90 tablet 3  . aspirin 81 MG tablet Take 81 mg by mouth daily.    . Bevacizumab (AVASTIN IV) Place 1 drop into the right eye as directed. Every 6-8 weeks    . clobetasol cream (TEMOVATE) 5.17 % Apply 1 application topically daily as needed (for contact dermatitis). Contact dermatitis  3  . clopidogrel (PLAVIX) 75 MG tablet Take 1 tablet (75 mg total) by mouth daily. 90 tablet 3  . famotidine (PEPCID) 20 MG tablet Take 1 tablet (20 mg total) by mouth 2 (two) times daily. 180 tablet 3  . glucose blood (ONE TOUCH ULTRA TEST) test strip Use as directed once daily to check blood sugar.  Diagnosis code E11.42 100 each 11  . latanoprost (XALATAN) 0.005 % ophthalmic solution Place 1 drop into both eyes at bedtime.     Marland Kitchen levothyroxine (SYNTHROID, LEVOTHROID) 100 MCG tablet Take 121mcg by mouth on Sunday, Tuesday, Thursday, and Saturday. Take 1.5tablets on Monday Wednesday, and Friday 115 tablet 3  . metFORMIN (GLUCOPHAGE) 1000 MG tablet Take 1 tablet (1,000 mg total) by mouth daily. 90 tablet 3  . metoprolol succinate (TOPROL-XL) 25 MG 24 hr tablet Take 1 tablet (25 mg total) by mouth every morning. 90 tablet 3  . Multiple Vitamins-Minerals (ICAPS AREDS 2 PO) Take 2 capsules by mouth daily.    Marland Kitchen tiZANidine (ZANAFLEX) 4 MG tablet Take 1 tablet (4 mg total) by mouth every 6 (six) hours as needed for muscle spasms. 600 tablet 1   No current  facility-administered medications for this visit.      Past Medical History:  Diagnosis Date  . Acute blood loss anemia 05/15/2011   post surgical  . Altered mental status 05/15/2011  . Anxiety   . B12 deficiency 09/23/2016  . Blood transfusion without reported diagnosis   . Cancer ([place])    basal and squamous skin carcinoma   . Cataract   . Chronic kidney disease    hx of uti due to catheter hx of prostatitis, BPH -   . Depression   . Diabetes mellitus   . GERD (gastroesophageal reflux disease)   . Glaucoma 01/04/2013  . Heart murmur   . History of nuclear stress test    Myoview 3/17: EF 62%, no ischemia or scar, low risk  . Hx of transient ischemic attack (TIA) 01/04/2013  . Hx: UTI (urinary tract infection)   . Hyperlipidemia   . Hypertension   . Hypokalemia 05/15/2011  . Hypothyroidism   . Insomnia   . Macular degeneration   . Neuropathy   . OA (osteoarthritis)   . Pneumonia   . PVC's (premature ventricular contractions)   . Renal mass   . Stroke ([place])    mid 1990s     Past Surgical History:  Procedure Laterality Date  . APPENDECTOMY    . CATARACT EXTRACTION W/ INTRAOCULAR LENS  IMPLANT, BILATERAL    .  INGUINAL HERNIA REPAIR     X3  . LUMBAR LAMINECTOMY/DECOMPRESSION MICRODISCECTOMY N/A 02/12/2017   Procedure: Lumbar Three-Four, Lumbar Four-Five, Lumbar Five-Sacral One Laminectomy and Foraminotomy;  Surgeon: Earnie Larsson, MD;  Location: [facility];  Service: Neurosurgery;  Laterality: N/A;  . OTHER SURGICAL HISTORY     microwave procedure for prostate - 09/12   . PROSTATE ABLATION     microwave  . REPLACEMENT TOTAL KNEE    . THYROIDECTOMY    . TONSILLECTOMY AND ADENOIDECTOMY    . TOTAL KNEE ARTHROPLASTY  05/11/2011   Procedure: TOTAL KNEE ARTHROPLASTY;  Surgeon: Gearlean Alf;  Location: [facility];  Service: Orthopedics;  Laterality: Right;  . US ECHOCARDIOGRAPHY  11/10/2006   EF 55-60%  . US ECHOCARDIOGRAPHY  09/20/2002   EF 65-70%    Social History    Socioeconomic History  . Marital status: Married    Spouse name: Not on file  . Number of children: 3  . Years of education: Not on file  . Highest education level: Not on file  Occupational History  . Occupation: retired    Fish farm manager: RETIRED    Comment: [employer]  Social Needs  . Financial resource strain: Not on file  . Food insecurity:    Worry: Not on file    Inability: Not on file  . Transportation needs:    Medical: Not on file    Non-medical: Not on file  Tobacco Use  . Smoking status: Never Smoker  . Smokeless tobacco: Never Used  Substance and Sexual Activity  . Alcohol use: Yes    Alcohol/week: 4.2 oz    Types: 7 Shots of liquor per week    Comment: 2 drinks most days  . Drug use: No  . Sexual activity: Never    Birth control/protection: None  Lifestyle  . Physical activity:    Days per week: Not on file    Minutes per session: Not on file  . Stress: Not on file  Relationships  . Social connections:    Talks on phone: Not on file    Gets together: Not on file    Attends religious service: Not on file    Active member of club or organization: Not on file    Attends meetings of clubs or organizations: Not on file    Relationship status: Not on file  . Intimate partner violence:    Fear of current or ex partner: Not on file    Emotionally abused: Not on file    Physically abused: Not on file    Forced sexual activity: Not on file  Other Topics Concern  . 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 Hypertension Mother   . Arthritis Mother   . Diabetes Mother   . Hypertension Father   . Stroke Father   . Kidney disease Father   . Hypertension Brother   . Diabetes Brother     ROS: Anxiety  and loss of vision due to macular degeneration but no fevers or chills, productive cough, hemoptysis, dysphasia, odynophagia, melena, hematochezia, dysuria, hematuria, rash, seizure activity, orthopnea, PND, pedal edema,  claudication. Remaining systems are negative.  Physical Exam: Well-developed well-nourished in no acute distress.  Skin is warm and dry.  HEENT is normal.  Neck is supple.  Chest is clear to auscultation with normal expansion.  Cardiovascular exam is regular rate and rhythm.  Abdominal exam nontender or distended. No masses palpated. Extremities show no edema. neuro grossly intact  ECG-normal sinus rhythm at a rate of 61.  Left axis deviation.  Left ventricular hypertrophy.  Septal infarct.  Personally reviewed  A/P  1 hypertension-blood pressure is elevated.  Increase amlodipine to 10 mg daily and follow.  2 prior TIA-continue aspirin and Plavix.  3 hyperlipidemia-management per primary care.  Kirk Ruths, MD

## 2017-08-31 ENCOUNTER — Telehealth: Payer: Self-pay | Admitting: Internal Medicine

## 2017-08-31 NOTE — Telephone Encounter (Signed)
Copied from [place]. Topic: Quick Communication - See Telephone Encounter >> Aug 31, 2017  3:29 PM Vernona Rieger wrote: CRM for notification. See Telephone encounter for:   08/31/17.  Pt is requesting blood work for his 4/11 appt. Advised pt no orders in. Please call patient at [phone]

## 2017-09-01 NOTE — Telephone Encounter (Signed)
Pt has active orders in chart.

## 2017-09-07 ENCOUNTER — Encounter: Payer: Self-pay | Admitting: Cardiology

## 2017-09-07 ENCOUNTER — Ambulatory Visit (INDEPENDENT_AMBULATORY_CARE_PROVIDER_SITE_OTHER): Payer: Medicare Other | Admitting: Cardiology

## 2017-09-07 VITALS — BP 156/83 | HR 85 | Ht 73.5 in | Wt 191.6 lb

## 2017-09-07 DIAGNOSIS — I119 Hypertensive heart disease without heart failure: Secondary | ICD-10-CM | POA: Diagnosis not present

## 2017-09-07 DIAGNOSIS — E7800 Pure hypercholesterolemia, unspecified: Secondary | ICD-10-CM

## 2017-09-07 MED ORDER — CLOPIDOGREL BISULFATE 75 MG PO TABS: 75.0000 mg | ORAL_TABLET | Freq: Every day | ORAL | 3 refills | Status: DC

## 2017-09-07 MED ORDER — METFORMIN HCL 1000 MG PO TABS: 1000.0000 mg | ORAL_TABLET | Freq: Every day | ORAL | 3 refills | Status: DC

## 2017-09-07 MED ORDER — AMLODIPINE BESYLATE 10 MG PO TABS: 10.0000 mg | ORAL_TABLET | ORAL | 3 refills | Status: DC

## 2017-09-07 NOTE — Patient Instructions (Signed)
Medication Instructions:   INCREASE AMLODIPINE TO 10 MG ONCE DAILY= 2 OF THE 5 MG TABLETS ONCE DAILY  Follow-Up:  Your physician recommends that you schedule a follow-up appointment in: [place]   If you need a refill on your cardiac medications before your next appointment, please call your pharmacy.

## 2017-09-08 ENCOUNTER — Ambulatory Visit: Payer: Medicare Other | Admitting: Podiatry

## 2017-09-08 DIAGNOSIS — H353124 Nonexudative age-related macular degeneration, left eye, advanced atrophic with subfoveal involvement: Secondary | ICD-10-CM | POA: Diagnosis not present

## 2017-09-08 DIAGNOSIS — H353113 Nonexudative age-related macular degeneration, right eye, advanced atrophic without subfoveal involvement: Secondary | ICD-10-CM | POA: Diagnosis not present

## 2017-09-08 DIAGNOSIS — H353211 Exudative age-related macular degeneration, right eye, with active choroidal neovascularization: Secondary | ICD-10-CM | POA: Diagnosis not present

## 2017-09-08 DIAGNOSIS — H35372 Puckering of macula, left eye: Secondary | ICD-10-CM | POA: Diagnosis not present

## 2017-09-17 ENCOUNTER — Other Ambulatory Visit (INDEPENDENT_AMBULATORY_CARE_PROVIDER_SITE_OTHER): Payer: Medicare Other

## 2017-09-17 DIAGNOSIS — E1142 Type 2 diabetes mellitus with diabetic polyneuropathy: Secondary | ICD-10-CM

## 2017-09-17 LAB — HEPATIC FUNCTION PANEL
ALT: 16 U/L (ref 0–53)
AST: 20 U/L (ref 0–37)
Albumin: 4.4 g/dL (ref 3.5–5.2)
Alkaline Phosphatase: 75 U/L (ref 39–117)
BILIRUBIN DIRECT: 0.1 mg/dL (ref 0.0–0.3)
Total Bilirubin: 0.6 mg/dL (ref 0.2–1.2)
Total Protein: 7.2 g/dL (ref 6.0–8.3)

## 2017-09-17 LAB — CBC WITH DIFFERENTIAL/PLATELET
BASOS PCT: 0.9 % (ref 0.0–3.0)
Basophils Absolute: 0 10*3/uL (ref 0.0–0.1)
EOS ABS: 0.1 10*3/uL (ref 0.0–0.7)
EOS PCT: 1.6 % (ref 0.0–5.0)
HCT: 44.3 % (ref 39.0–52.0)
HEMOGLOBIN: 15.3 g/dL (ref 13.0–17.0)
LYMPHS ABS: 1.4 10*3/uL (ref 0.7–4.0)
Lymphocytes Relative: 23.6 % (ref 12.0–46.0)
MCHC: 34.5 g/dL (ref 30.0–36.0)
MCV: 97.3 fl (ref 78.0–100.0)
MONO ABS: 0.6 10*3/uL (ref 0.1–1.0)
Monocytes Relative: 10.5 % (ref 3.0–12.0)
NEUTROS PCT: 63.4 % (ref 43.0–77.0)
Neutro Abs: 3.7 10*3/uL (ref 1.4–7.7)
Platelets: 233 10*3/uL (ref 150.0–400.0)
RBC: 4.56 Mil/uL (ref 4.22–5.81)
RDW: 13.4 % (ref 11.5–15.5)
WBC: 5.9 10*3/uL (ref 4.0–10.5)

## 2017-09-17 LAB — URINALYSIS, ROUTINE W REFLEX MICROSCOPIC
BILIRUBIN URINE: NEGATIVE
Hgb urine dipstick: NEGATIVE
LEUKOCYTES UA: NEGATIVE
Nitrite: NEGATIVE
PH: 5.5 (ref 5.0–8.0)
TOTAL PROTEIN, URINE-UPE24: NEGATIVE
Urine Glucose: NEGATIVE
Urobilinogen, UA: 0.2 (ref 0.0–1.0)

## 2017-09-17 LAB — LIPID PANEL
CHOL/HDL RATIO: 3
Cholesterol: 186 mg/dL (ref 0–200)
HDL: 69.5 mg/dL (ref >39.00)
LDL CALC: 98 mg/dL (ref 0–99)
NONHDL: 116.42
Triglycerides: 91 mg/dL (ref 0.0–149.0)
VLDL: 18.2 mg/dL (ref 0.0–40.0)

## 2017-09-17 LAB — BASIC METABOLIC PANEL
BUN: 16 mg/dL (ref 6–23)
CHLORIDE: 104 meq/L (ref 96–112)
CO2: 23 meq/L (ref 19–32)
Calcium: 9.4 mg/dL (ref 8.4–10.5)
Creatinine, Ser: 1.24 mg/dL (ref 0.40–1.50)
GFR: 58.59 mL/min — ABNORMAL LOW (ref >60.00)
GLUCOSE: 129 mg/dL — AB (ref 70–99)
POTASSIUM: 4 meq/L (ref 3.5–5.1)
SODIUM: 139 meq/L (ref 135–145)

## 2017-09-17 LAB — MICROALBUMIN / CREATININE URINE RATIO
CREATININE, U: 192.8 mg/dL
MICROALB/CREAT RATIO: 0.4 mg/g (ref 0.0–30.0)
Microalb, Ur: 0.7 mg/dL (ref 0.0–1.9)

## 2017-09-17 LAB — HEMOGLOBIN A1C: HEMOGLOBIN A1C: 6.6 % — AB (ref 4.6–6.5)

## 2017-09-17 LAB — TSH: TSH: 6.61 u[IU]/mL — AB (ref 0.35–4.50)

## 2017-09-23 ENCOUNTER — Ambulatory Visit (INDEPENDENT_AMBULATORY_CARE_PROVIDER_SITE_OTHER): Payer: Medicare Other | Admitting: Internal Medicine

## 2017-09-23 ENCOUNTER — Encounter: Payer: Self-pay | Admitting: Internal Medicine

## 2017-09-23 VITALS — BP 106/78 | HR 69 | Temp 98.6°F | Ht 73.5 in | Wt 191.0 lb

## 2017-09-23 DIAGNOSIS — E034 Atrophy of thyroid (acquired): Secondary | ICD-10-CM | POA: Diagnosis not present

## 2017-09-23 DIAGNOSIS — E7800 Pure hypercholesterolemia, unspecified: Secondary | ICD-10-CM

## 2017-09-23 DIAGNOSIS — E1142 Type 2 diabetes mellitus with diabetic polyneuropathy: Secondary | ICD-10-CM | POA: Diagnosis not present

## 2017-09-23 DIAGNOSIS — F419 Anxiety disorder, unspecified: Secondary | ICD-10-CM

## 2017-09-23 MED ORDER — MIRTAZAPINE 15 MG PO TABS: 15.0000 mg | ORAL_TABLET | Freq: Every day | ORAL | 3 refills | Status: DC

## 2017-09-23 MED ORDER — FAMOTIDINE 20 MG PO TABS: 20.0000 mg | ORAL_TABLET | Freq: Two times a day (BID) | ORAL | 3 refills | Status: DC

## 2017-09-23 MED ORDER — ALPRAZOLAM 0.25 MG PO TABS: 0.2500 mg | ORAL_TABLET | Freq: Two times a day (BID) | ORAL | 1 refills | Status: DC

## 2017-09-23 MED ORDER — LEVOTHYROXINE SODIUM 112 MCG PO TABS: 112.0000 ug | ORAL_TABLET | Freq: Every day | ORAL | 3 refills | Status: DC

## 2017-09-23 NOTE — Progress Notes (Signed)
Subjective:    Patient ID: Stephen Wilkinson, male    DOB: 27-Sep-1928, 84 y.o.   MRN: [id number]  HPI  Here for yearly f/u;  Overall doing ok;  Pt denies Chest pain, worsening SOB, DOE, wheezing, orthopnea, PND, worsening LE edema, palpitations, dizziness or syncope.  Pt denies neurological change such as new headache, facial or extremity weakness.  Pt denies polydipsia, polyuria, or low sugar symptoms. Pt states overall good compliance with treatment and medications, good tolerability, and has been trying to follow appropriate diet.  Pt denies worsening depressive symptoms, suicidal ideation or panic. No fever, night sweats, wt loss, loss of appetite, or other constitutional symptoms.  Pt states good ability with ADL's, has low fall risk, home safety reviewed and adequate, no other significant changes in hearing or vision, and only occasionally active with exercise. BP Readings from Last 3 Encounters:  09/23/17 106/78  09/07/17 (!) 156/83  03/25/17 136/84  has gained some wt.   Wt Readings from Last 3 Encounters:  09/23/17 191 lb (86.6 kg)  09/07/17 191 lb 9.6 oz (86.9 kg)  03/25/17 182 lb (82.6 kg)  States "I am going blind" , has seen optho with floaters and macular degeneration s/p injections where he eventually wont be able to drive or read, but will see shapes and light. He is very concerned about developing neuropathy since his mother and gma lost feet related to this. Now s/p lumbar surgury per Dr Trenton Gammon with remarkable pain improvement to none now. Now off alprazolam and cytomel that he had been on for many years.  Denies hyper or hypo thyroid symptoms such as voice, skin or hair change. Has difficulty sleeping, asks for alternative tx.  Declines statin for now, has tried lipitor in [place]. Past Medical History:  Diagnosis Date  . Acute blood loss anemia 05/15/2011   post surgical  . Altered mental status 05/15/2011  . Anxiety   . B12 deficiency 09/23/2016  . Blood transfusion without reported  diagnosis   . Cancer ([place])    basal and squamous skin carcinoma   . Cataract   . Chronic kidney disease    hx of uti due to catheter hx of prostatitis, BPH -   . Depression   . Diabetes mellitus   . GERD (gastroesophageal reflux disease)   . Glaucoma 01/04/2013  . Heart murmur   . History of nuclear stress test    Myoview 3/17: EF 62%, no ischemia or scar, low risk  . Hx of transient ischemic attack (TIA) 01/04/2013  . Hx: UTI (urinary tract infection)   . Hyperlipidemia   . Hypertension   . Hypokalemia 05/15/2011  . Hypothyroidism   . Insomnia   . Macular degeneration   . Neuropathy   . OA (osteoarthritis)   . Pneumonia   . PVC's (premature ventricular contractions)   . Renal mass   . Stroke ([place])    mid 1990s    Past Surgical History:  Procedure Laterality Date  . APPENDECTOMY    . CATARACT EXTRACTION W/ INTRAOCULAR LENS  IMPLANT, BILATERAL    . INGUINAL HERNIA REPAIR     X3  . LUMBAR LAMINECTOMY/DECOMPRESSION MICRODISCECTOMY N/A 02/12/2017   Procedure: Lumbar Three-Four, Lumbar Four-Five, Lumbar Five-Sacral One Laminectomy and Foraminotomy;  Surgeon: Earnie Larsson, MD;  Location: [facility];  Service: Neurosurgery;  Laterality: N/A;  . OTHER SURGICAL HISTORY     microwave procedure for prostate - 09/12   . PROSTATE ABLATION     microwave  . REPLACEMENT  TOTAL KNEE    . THYROIDECTOMY    . TONSILLECTOMY AND ADENOIDECTOMY    . TOTAL KNEE ARTHROPLASTY  05/11/2011   Procedure: TOTAL KNEE ARTHROPLASTY;  Surgeon: Gearlean Alf;  [facility];  Service: Orthopedics;  Laterality: Right;  . US ECHOCARDIOGRAPHY  11/10/2006   EF 55-60%  . US ECHOCARDIOGRAPHY  09/20/2002   EF 65-70%    reports that he has never smoked. He has never used smokeless tobacco. He reports that he drinks about 4.2 oz of alcohol per week. He reports that he does not use drugs. family history includes Arthritis in his mother; Diabetes in his brother and mother; Hypertension in his brother, father, and  mother; Kidney disease in his father; Stroke in his father. No Known Allergies Current Outpatient Medications on File Prior to Visit  Medication Sig Dispense Refill  . amLODipine (NORVASC) 10 MG tablet Take 1 tablet (10 mg total) by mouth every morning. 90 tablet 3  . aspirin 81 MG tablet Take 81 mg by mouth daily.    . Bevacizumab (AVASTIN IV) Place 1 drop into the right eye as directed. Every 6-8 weeks    . clobetasol cream (TEMOVATE) 6.96 % Apply 1 application topically daily as needed (for contact dermatitis). Contact dermatitis  3  . clopidogrel (PLAVIX) 75 MG tablet Take 1 tablet (75 mg total) by mouth daily. 90 tablet 3  . glucose blood (ONE TOUCH ULTRA TEST) test strip Use as directed once daily to check blood sugar.  Diagnosis code E11.42 100 each 11  . latanoprost (XALATAN) 0.005 % ophthalmic solution Place 1 drop into both eyes at bedtime.     . metFORMIN (GLUCOPHAGE) 1000 MG tablet Take 1 tablet (1,000 mg total) by mouth daily. 90 tablet 3  . metoprolol succinate (TOPROL-XL) 25 MG 24 hr tablet Take 1 tablet (25 mg total) by mouth every morning. 90 tablet 3  . Multiple Vitamins-Minerals (ICAPS AREDS 2 PO) Take 2 capsules by mouth daily.    Marland Kitchen tiZANidine (ZANAFLEX) 4 MG tablet Take 1 tablet (4 mg total) by mouth every 6 (six) hours as needed for muscle spasms. 600 tablet 1  . [DISCONTINUED] warfarin (COUMADIN) 3 MG tablet Take 1 tablet (3 mg total) by mouth one time only at 6 PM. 35 tablet 0   No current facility-administered medications on file prior to visit.    Review of Systems Constitutional: Negative for other unusual diaphoresis, sweats, appetite or weight changes HENT: Negative for other worsening hearing loss, ear pain, facial swelling, mouth sores or neck stiffness.   Eyes: Negative for other worsening pain, redness or other visual disturbance.  Respiratory: Negative for other stridor or swelling Cardiovascular: Negative for other palpitations or other chest pain    Gastrointestinal: Negative for worsening diarrhea or loose stools, blood in stool, distention or other pain Genitourinary: Negative for hematuria, flank pain or other change in urine volume.  Musculoskeletal: Negative for myalgias or other joint swelling.  Skin: Negative for other color change, or other wound or worsening drainage.  Neurological: Negative for other syncope or numbness. Hematological: Negative for other adenopathy or swelling Psychiatric/Behavioral: Negative for hallucinations, other worsening agitation, SI, self-injury, or new decreased concentration All other system neg per pt    Objective:   Physical Exam BP 106/78   Pulse 69   Temp 98.6 F (37 C) (Oral)   Ht 6' 1.5" (1.867 m)   Wt 191 lb (86.6 kg)   SpO2 98%   BMI 24.86 kg/m  VS  noted,  Constitutional: Pt is oriented to person, place, and time. Appears well-developed and well-nourished, in no significant distress and comfortable Head: Normocephalic and atraumatic  Eyes: Conjunctivae and EOM are normal. Pupils are equal, round, and reactive to light Right Ear: External ear normal without discharge Left Ear: External ear normal without discharge Nose: Nose without discharge or deformity Mouth/Throat: Oropharynx is without other ulcerations and moist  Neck: Normal range of motion. Neck supple. No JVD present. No tracheal deviation present or significant neck LA or mass Cardiovascular: Normal rate, regular rhythm, normal heart sounds and intact distal pulses.   Pulmonary/Chest: WOB normal and breath sounds without rales or wheezing  Abdominal: Soft. Bowel sounds are normal. NT. No HSM  Musculoskeletal: Normal range of motion. Exhibits no edema Lymphadenopathy: Has no other cervical adenopathy.  Neurological: Pt is alert and oriented to person, place, and time. Pt has normal reflexes. No cranial nerve deficit. Motor grossly intact, Gait intact Skin: Skin is warm and dry. No rash noted or new  ulcerations Psychiatric:  Has nervous depressed mood and affect. Behavior is normal without agitation No other exam findings  Lab Results  Component Value Date   WBC 5.9 09/17/2017   HGB 15.3 09/17/2017   HCT 44.3 09/17/2017   PLT 233.0 09/17/2017   GLUCOSE 129 (H) 09/17/2017   CHOL 186 09/17/2017   TRIG 91.0 09/17/2017   HDL 69.50 09/17/2017   LDLCALC 98 09/17/2017   ALT 16 09/17/2017   AST 20 09/17/2017   NA 139 09/17/2017   K 4.0 09/17/2017   CL 104 09/17/2017   CREATININE 1.24 09/17/2017   BUN 16 09/17/2017   CO2 23 09/17/2017   TSH 6.61 (H) 09/17/2017   INR 2.16 (H) 05/15/2011   HGBA1C 6.6 (H) 09/17/2017   MICROALBUR 0.7 09/17/2017      Assessment & Plan:

## 2017-09-23 NOTE — Patient Instructions (Signed)
Please take all new medication as prescribed - the remeron for sleep and depression  OK to increase the levothyroxine (synthroid) to 112 mcg per day  Please continue all other medications as before, and refills have been done if requested - the 3 prescriptions you wanted (4 total with the remeron)  Please have the pharmacy call with any other refills you may need.  Please continue your efforts at being more active, low cholesterol diet, and weight control.  Please keep your appointments with your specialists as you may have planned  Please return in 6 months, or sooner if needed, with Lab testing done 3-5 days before

## 2017-09-25 DIAGNOSIS — F419 Anxiety disorder, unspecified: Secondary | ICD-10-CM | POA: Insufficient documentation

## 2017-09-25 NOTE — Assessment & Plan Note (Signed)
Also sleep diffiicutly - for remeron qhs

## 2017-09-25 NOTE — Assessment & Plan Note (Signed)
Lab Results  Component Value Date   HGBA1C 6.6 (H) 09/17/2017   stable overall by history and exam, recent data reviewed with pt, and pt to continue medical treatment as before,  to f/u any worsening symptoms or concerns

## 2017-09-25 NOTE — Assessment & Plan Note (Signed)
Lab Results  Component Value Date   LDLCALC 98 09/17/2017  stable overall by history and exam, recent data reviewed with pt, and pt to continue medical treatment as before,  to f/u any worsening symptoms or concerns

## 2017-09-25 NOTE — Assessment & Plan Note (Addendum)
Mild uncontrolled,  Lab Results  Component Value Date   TSH 6.61 (H) 09/17/2017  stable overall by history and exam, recent data reviewed with pt, and pt to increase synthroid to 112 qd,  to f/u any worsening symptoms or concerns, for f/u lab

## 2017-09-28 ENCOUNTER — Ambulatory Visit: Payer: Medicare Other | Admitting: Podiatry

## 2017-10-05 DIAGNOSIS — X32XXXD Exposure to sunlight, subsequent encounter: Secondary | ICD-10-CM | POA: Diagnosis not present

## 2017-10-05 DIAGNOSIS — T148XXA Other injury of unspecified body region, initial encounter: Secondary | ICD-10-CM | POA: Diagnosis not present

## 2017-10-05 DIAGNOSIS — L570 Actinic keratosis: Secondary | ICD-10-CM | POA: Diagnosis not present

## 2017-10-05 DIAGNOSIS — Z85828 Personal history of other malignant neoplasm of skin: Secondary | ICD-10-CM | POA: Diagnosis not present

## 2017-10-05 DIAGNOSIS — Z08 Encounter for follow-up examination after completed treatment for malignant neoplasm: Secondary | ICD-10-CM | POA: Diagnosis not present

## 2017-10-05 DIAGNOSIS — C4442 Squamous cell carcinoma of skin of scalp and neck: Secondary | ICD-10-CM | POA: Diagnosis not present

## 2017-10-20 DIAGNOSIS — H43391 Other vitreous opacities, right eye: Secondary | ICD-10-CM | POA: Diagnosis not present

## 2017-10-20 DIAGNOSIS — H353113 Nonexudative age-related macular degeneration, right eye, advanced atrophic without subfoveal involvement: Secondary | ICD-10-CM | POA: Diagnosis not present

## 2017-10-20 DIAGNOSIS — H353211 Exudative age-related macular degeneration, right eye, with active choroidal neovascularization: Secondary | ICD-10-CM | POA: Diagnosis not present

## 2017-10-29 ENCOUNTER — Ambulatory Visit: Payer: Medicare Other | Admitting: Podiatry

## 2017-11-12 ENCOUNTER — Encounter: Payer: Self-pay | Admitting: Podiatry

## 2017-11-12 ENCOUNTER — Ambulatory Visit (INDEPENDENT_AMBULATORY_CARE_PROVIDER_SITE_OTHER): Payer: Medicare Other | Admitting: Podiatry

## 2017-11-12 DIAGNOSIS — B351 Tinea unguium: Secondary | ICD-10-CM | POA: Diagnosis not present

## 2017-11-12 DIAGNOSIS — D689 Coagulation defect, unspecified: Secondary | ICD-10-CM | POA: Diagnosis not present

## 2017-11-12 DIAGNOSIS — M79676 Pain in unspecified toe(s): Secondary | ICD-10-CM | POA: Diagnosis not present

## 2017-11-12 DIAGNOSIS — E1140 Type 2 diabetes mellitus with diabetic neuropathy, unspecified: Secondary | ICD-10-CM | POA: Diagnosis not present

## 2017-11-12 NOTE — Progress Notes (Signed)
Patient ID: Stephen Wilkinson, male   DOB: August 27, 1928, 84 y.o.   MRN: [id number] Complaint:  Visit Type: Patient returns to my office for continued preventative foot care services. Complaint: Patient states" my nails have grown long and thick and become painful to walk and wear shoes" Patient has been diagnosed with DM with no foot complications. The patient presents for preventative foot care services. No changes to ROS.  He has macular degeneration with history of mother and grandmother losing their legs to diabetes.  Podiatric Exam: Vascular: dorsalis pedis and posterior tibial pulses are palpable bilateral. Capillary return is immediate. Temperature gradient is WNL. Skin turgor WNL  Sensorium: Normal Semmes Weinstein monofilament test. Normal tactile sensation bilaterally. Nail Exam: Pt has thick disfigured discolored nails with subungual debris noted bilateral entire nail hallux through fifth toenails Ulcer Exam: There is no evidence of ulcer or pre-ulcerative changes or infection. Orthopedic Exam: Muscle tone and strength are WNL. No limitations in general ROM. No crepitus or effusions noted. Foot type and digits show no abnormalities. Bony prominences are unremarkable. HAV B/L. Skin: . No infection or ulcers.Asymptomatic porokeratosis sub 5th right.  Diagnosis:  Onychomycosis, , Pain in right toe, pain in left toes,  Treatment & Plan Procedures and Treatment: Consent by patient was obtained for treatment procedures. The patient understood the discussion of treatment and procedures well. All questions were answered thoroughly reviewed. Debridement of mycotic and hypertrophic toenails, 1 through 5 bilateral and clearing of subungual debris. No ulceration, no infection noted. ABN signed for 2019. Recommended insoles for feet due to leg pain. Return Visit-Office Procedure: Patient instructed to return to the office for a follow up visit 10 weeks  for continued evaluation and treatment.  Gardiner Barefoot  DPM

## 2017-12-01 DIAGNOSIS — H353124 Nonexudative age-related macular degeneration, left eye, advanced atrophic with subfoveal involvement: Secondary | ICD-10-CM | POA: Diagnosis not present

## 2017-12-01 DIAGNOSIS — H353113 Nonexudative age-related macular degeneration, right eye, advanced atrophic without subfoveal involvement: Secondary | ICD-10-CM | POA: Diagnosis not present

## 2017-12-01 DIAGNOSIS — H43391 Other vitreous opacities, right eye: Secondary | ICD-10-CM | POA: Diagnosis not present

## 2017-12-01 DIAGNOSIS — H353211 Exudative age-related macular degeneration, right eye, with active choroidal neovascularization: Secondary | ICD-10-CM | POA: Diagnosis not present

## 2018-01-13 ENCOUNTER — Ambulatory Visit: Payer: Medicare Other

## 2018-01-19 DIAGNOSIS — H35372 Puckering of macula, left eye: Secondary | ICD-10-CM | POA: Diagnosis not present

## 2018-01-19 DIAGNOSIS — H353124 Nonexudative age-related macular degeneration, left eye, advanced atrophic with subfoveal involvement: Secondary | ICD-10-CM | POA: Diagnosis not present

## 2018-01-19 DIAGNOSIS — H353211 Exudative age-related macular degeneration, right eye, with active choroidal neovascularization: Secondary | ICD-10-CM | POA: Diagnosis not present

## 2018-01-19 DIAGNOSIS — H43391 Other vitreous opacities, right eye: Secondary | ICD-10-CM | POA: Diagnosis not present

## 2018-01-19 DIAGNOSIS — H353113 Nonexudative age-related macular degeneration, right eye, advanced atrophic without subfoveal involvement: Secondary | ICD-10-CM | POA: Diagnosis not present

## 2018-01-21 ENCOUNTER — Ambulatory Visit: Payer: Medicare Other | Admitting: Podiatry

## 2018-02-21 DIAGNOSIS — H5211 Myopia, right eye: Secondary | ICD-10-CM | POA: Diagnosis not present

## 2018-02-21 DIAGNOSIS — Z961 Presence of intraocular lens: Secondary | ICD-10-CM | POA: Diagnosis not present

## 2018-02-25 ENCOUNTER — Ambulatory Visit (INDEPENDENT_AMBULATORY_CARE_PROVIDER_SITE_OTHER): Payer: Medicare Other | Admitting: Podiatry

## 2018-02-25 ENCOUNTER — Encounter: Payer: Self-pay | Admitting: Podiatry

## 2018-02-25 DIAGNOSIS — M79676 Pain in unspecified toe(s): Secondary | ICD-10-CM

## 2018-02-25 DIAGNOSIS — B351 Tinea unguium: Secondary | ICD-10-CM | POA: Diagnosis not present

## 2018-02-25 DIAGNOSIS — E1140 Type 2 diabetes mellitus with diabetic neuropathy, unspecified: Secondary | ICD-10-CM

## 2018-02-25 DIAGNOSIS — D689 Coagulation defect, unspecified: Secondary | ICD-10-CM

## 2018-02-25 NOTE — Progress Notes (Signed)
Patient ID: Stephen Wilkinson, male   DOB: [birth year], 84 y.o.   MRN: [id number] Complaint:  Visit Type: Patient returns to my office for continued preventative foot care services. Complaint: Patient states" my nails have grown long and thick and become painful to walk and wear shoes" Patient has been diagnosed with DM with no foot complications. The patient presents for preventative foot care services. No changes to ROS.  He has macular degeneration with history of mother and grandmother losing their legs to diabetes. Patient is taking plavix.  Podiatric Exam: Vascular: dorsalis pedis and posterior tibial pulses are palpable bilateral. Capillary return is immediate. Temperature gradient is WNL. Skin turgor WNL  Sensorium: Normal Semmes Weinstein monofilament test. Normal tactile sensation bilaterally. Nail Exam: Pt has thick disfigured discolored nails with subungual debris noted bilateral entire nail hallux through fifth toenails Ulcer Exam: There is no evidence of ulcer or pre-ulcerative changes or infection. Orthopedic Exam: Muscle tone and strength are WNL. No limitations in general ROM. No crepitus or effusions noted. Foot type and digits show no abnormalities. Bony prominences are unremarkable. HAV B/L. Skin: . No infection or ulcers.Asymptomatic porokeratosis sub 5th right.  Diagnosis:  Onychomycosis, , Pain in right toe, pain in left toes,  Treatment & Plan Procedures and Treatment: Consent by patient was obtained for treatment procedures. The patient understood the discussion of treatment and procedures well. All questions were answered thoroughly reviewed. Debridement of mycotic and hypertrophic toenails, 1 through 5 bilateral and clearing of subungual debris. No ulceration, no infection noted. ABN signed for 2019. Powerstep insoles prescribed. Return Visit-Office Procedure: Patient instructed to return to the office for a follow up visit 10 weeks  for continued evaluation and treatment.  Gardiner Barefoot DPM

## 2018-03-01 NOTE — Progress Notes (Signed)
HPI: Follow-up hypertension and palpitations. Also history of TIA. Echo 2008 showed normal LV function; grade 1 DD; mild LVH; trace AI; mild MR and TR. Nuclear study March 2017 showed ejection fraction 62% and no ischemia. Since last seen,  patient denies dyspnea, chest pain, palpitations or syncope.  Current Outpatient Medications  Medication Sig Dispense Refill  . ALPRAZolam (XANAX) 0.25 MG tablet Take 1 tablet (0.25 mg total) by mouth 2 (two) times daily. 180 tablet 1  . amLODipine (NORVASC) 10 MG tablet Take 1 tablet (10 mg total) by mouth every morning. 90 tablet 3  . aspirin 81 MG tablet Take 81 mg by mouth daily.    . Bevacizumab (AVASTIN IV) Place 1 drop into the right eye as directed. Every 6-8 weeks    . clobetasol cream (TEMOVATE) 9.93 % Apply 1 application topically daily as needed (for contact dermatitis). Contact dermatitis  3  . clopidogrel (PLAVIX) 75 MG tablet Take 1 tablet (75 mg total) by mouth daily. 90 tablet 3  . famotidine (PEPCID) 20 MG tablet Take 1 tablet (20 mg total) by mouth 2 (two) times daily. 180 tablet 3  . glucose blood (ONE TOUCH ULTRA TEST) test strip Use as directed once daily to check blood sugar.  Diagnosis code E11.42 100 each 11  . latanoprost (XALATAN) 0.005 % ophthalmic solution Place 1 drop into both eyes at bedtime.     Marland Kitchen levothyroxine (SYNTHROID, LEVOTHROID) 112 MCG tablet Take 1 tablet (112 mcg total) by mouth daily. 90 tablet 3  . metFORMIN (GLUCOPHAGE) 1000 MG tablet Take 1 tablet (1,000 mg total) by mouth daily. 90 tablet 3  . metoprolol succinate (TOPROL-XL) 25 MG 24 hr tablet Take 1 tablet (25 mg total) by mouth every morning. 90 tablet 3  . mirtazapine (REMERON) 15 MG tablet Take 1 tablet (15 mg total) by mouth at bedtime. 90 tablet 3  . Multiple Vitamins-Minerals (ICAPS AREDS 2 PO) Take 2 capsules by mouth daily.    Marland Kitchen tiZANidine (ZANAFLEX) 4 MG tablet Take 1 tablet (4 mg total) by mouth every 6 (six) hours as needed for muscle spasms.  600 tablet 1   No current facility-administered medications for this visit.      Past Medical History:  Diagnosis Date  . Acute blood loss anemia 05/15/2011   post surgical  . Altered mental status 05/15/2011  . Anxiety   . B12 deficiency 09/23/2016  . Blood transfusion without reported diagnosis   . Cancer ([place])    basal and squamous skin carcinoma   . Cataract   . Chronic kidney disease    hx of uti due to catheter hx of prostatitis, BPH -   . Depression   . Diabetes mellitus   . GERD (gastroesophageal reflux disease)   . Glaucoma 01/04/2013  . Heart murmur   . History of nuclear stress test    Myoview 3/17: EF 62%, no ischemia or scar, low risk  . Hx of transient ischemic attack (TIA) 01/04/2013  . Hx: UTI (urinary tract infection)   . Hyperlipidemia   . Hypertension   . Hypokalemia 05/15/2011  . Hypothyroidism   . Insomnia   . Macular degeneration   . Neuropathy   . OA (osteoarthritis)   . Pneumonia   . PVC's (premature ventricular contractions)   . Renal mass   . Stroke ([place])    mid 1990s     Past Surgical History:  Procedure Laterality Date  . APPENDECTOMY    .  CATARACT EXTRACTION W/ INTRAOCULAR LENS  IMPLANT, BILATERAL    . INGUINAL HERNIA REPAIR     X3  . LUMBAR LAMINECTOMY/DECOMPRESSION MICRODISCECTOMY N/A 02/12/2017   Procedure: Lumbar Three-Four, Lumbar Four-Five, Lumbar Five-Sacral One Laminectomy and Foraminotomy;  Surgeon: Earnie Larsson, MD;  Location: [facility];  Service: Neurosurgery;  Laterality: N/A;  . OTHER SURGICAL HISTORY     microwave procedure for prostate - 09/12   . PROSTATE ABLATION     microwave  . REPLACEMENT TOTAL KNEE    . THYROIDECTOMY    . TONSILLECTOMY AND ADENOIDECTOMY    . TOTAL KNEE ARTHROPLASTY  05/11/2011   Procedure: TOTAL KNEE ARTHROPLASTY;  Surgeon: Gearlean Alf;  Location: [facility];  Service: Orthopedics;  Laterality: Right;  . US ECHOCARDIOGRAPHY  11/10/2006   EF 55-60%  . US ECHOCARDIOGRAPHY  09/20/2002   EF 65-70%     Social History   Socioeconomic History  . Marital status: Married    Spouse name: Not on file  . Number of children: 3  . Years of education: Not on file  . Highest education level: Not on file  Occupational History  . Occupation: retired    Fish farm manager: RETIRED    Comment: [employer]  Social Needs  . Financial resource strain: Not on file  . Food insecurity:    Worry: Not on file    Inability: Not on file  . Transportation needs:    Medical: Not on file    Non-medical: Not on file  Tobacco Use  . Smoking status: Never Smoker  . Smokeless tobacco: Never Used  Substance and Sexual Activity  . Alcohol use: Yes    Alcohol/week: 7.0 standard drinks    Types: 7 Shots of liquor per week    Comment: 2 drinks most days  . Drug use: No  . Sexual activity: Never    Birth control/protection: None  Lifestyle  . Physical activity:    Days per week: Not on file    Minutes per session: Not on file  . Stress: Not on file  Relationships  . Social connections:    Talks on phone: Not on file    Gets together: Not on file    Attends religious service: Not on file    Active member of club or organization: Not on file    Attends meetings of clubs or organizations: Not on file    Relationship status: Not on file  . Intimate partner violence:    Fear of current or ex partner: Not on file    Emotionally abused: Not on file    Physically abused: Not on file    Forced sexual activity: Not on file  Other Topics Concern  . 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 Hypertension Mother   . Arthritis Mother   . Diabetes Mother   . Hypertension Father   . Stroke Father   . Kidney disease Father   . Hypertension Brother   .  Diabetes Brother     ROS: Some fatigue that he attributes to thyroid issues but no fevers or chills, productive cough, hemoptysis, dysphasia,  odynophagia, melena, hematochezia, dysuria, hematuria, rash, seizure activity, orthopnea, PND, pedal edema, claudication. Remaining systems are negative.  Physical Exam: Well-developed well-nourished in no acute distress.  Skin is warm and dry.  HEENT is normal.  Neck is supple.  Chest is clear to auscultation with normal expansion.  Cardiovascular exam is regular rate and rhythm.  1/6 systolic murmur left sternal border. Abdominal exam nontender or distended. No masses palpated. Extremities show no edema. neuro grossly intact   A/P  1 hypertension-blood pressure is controlled.  Continue present medications and follow.  2 hyperlipidemia-managed by primary care.  3 prior TIA-he will continue on aspirin and Plavix.  Kirk Ruths, MD

## 2018-03-09 DIAGNOSIS — H353113 Nonexudative age-related macular degeneration, right eye, advanced atrophic without subfoveal involvement: Secondary | ICD-10-CM | POA: Diagnosis not present

## 2018-03-09 DIAGNOSIS — H353124 Nonexudative age-related macular degeneration, left eye, advanced atrophic with subfoveal involvement: Secondary | ICD-10-CM | POA: Diagnosis not present

## 2018-03-09 DIAGNOSIS — H353211 Exudative age-related macular degeneration, right eye, with active choroidal neovascularization: Secondary | ICD-10-CM | POA: Diagnosis not present

## 2018-03-09 DIAGNOSIS — H43391 Other vitreous opacities, right eye: Secondary | ICD-10-CM | POA: Diagnosis not present

## 2018-03-11 ENCOUNTER — Encounter: Payer: Self-pay | Admitting: Cardiology

## 2018-03-11 ENCOUNTER — Ambulatory Visit (INDEPENDENT_AMBULATORY_CARE_PROVIDER_SITE_OTHER): Payer: Medicare Other | Admitting: Cardiology

## 2018-03-11 VITALS — BP 120/68 | HR 74 | Ht 73.5 in | Wt 189.0 lb

## 2018-03-11 DIAGNOSIS — E7800 Pure hypercholesterolemia, unspecified: Secondary | ICD-10-CM | POA: Diagnosis not present

## 2018-03-11 DIAGNOSIS — I119 Hypertensive heart disease without heart failure: Secondary | ICD-10-CM

## 2018-03-11 MED ORDER — AMLODIPINE BESYLATE 10 MG PO TABS: 10.0000 mg | ORAL_TABLET | ORAL | 3 refills | Status: DC

## 2018-03-11 MED ORDER — CLOPIDOGREL BISULFATE 75 MG PO TABS: 75.0000 mg | ORAL_TABLET | Freq: Every day | ORAL | 3 refills | Status: DC

## 2018-03-11 MED ORDER — METOPROLOL SUCCINATE ER 25 MG PO TB24: 25.0000 mg | ORAL_TABLET | ORAL | 3 refills | Status: DC

## 2018-03-11 NOTE — Patient Instructions (Signed)
Your physician wants you to follow-up in: 6 MONTHS WITH DR CRENSHAW You will receive a reminder letter in the mail two months in advance. If you Naol't receive a letter, please call our office to schedule the follow-up appointment.   If you need a refill on your cardiac medications before your next appointment, please call your pharmacy.  

## 2018-03-16 ENCOUNTER — Other Ambulatory Visit (INDEPENDENT_AMBULATORY_CARE_PROVIDER_SITE_OTHER): Payer: Medicare Other

## 2018-03-16 DIAGNOSIS — E1142 Type 2 diabetes mellitus with diabetic polyneuropathy: Secondary | ICD-10-CM | POA: Diagnosis not present

## 2018-03-16 DIAGNOSIS — E034 Atrophy of thyroid (acquired): Secondary | ICD-10-CM | POA: Diagnosis not present

## 2018-03-16 LAB — LIPID PANEL
CHOL/HDL RATIO: 3
CHOLESTEROL: 193 mg/dL (ref 0–200)
HDL: 69.3 mg/dL (ref >39.00)
LDL CALC: 102 mg/dL — AB (ref 0–99)
NonHDL: 123.33
TRIGLYCERIDES: 108 mg/dL (ref 0.0–149.0)
VLDL: 21.6 mg/dL (ref 0.0–40.0)

## 2018-03-16 LAB — BASIC METABOLIC PANEL
BUN: 20 mg/dL (ref 6–23)
CHLORIDE: 104 meq/L (ref 96–112)
CO2: 22 meq/L (ref 19–32)
CREATININE: 1.42 mg/dL (ref 0.40–1.50)
Calcium: 9.4 mg/dL (ref 8.4–10.5)
GFR: 50.05 mL/min — ABNORMAL LOW (ref >60.00)
Glucose, Bld: 178 mg/dL — ABNORMAL HIGH (ref 70–99)
Potassium: 4.2 mEq/L (ref 3.5–5.1)
Sodium: 139 mEq/L (ref 135–145)

## 2018-03-16 LAB — HEPATIC FUNCTION PANEL
ALBUMIN: 4.5 g/dL (ref 3.5–5.2)
ALK PHOS: 73 U/L (ref 39–117)
ALT: 16 U/L (ref 0–53)
AST: 19 U/L (ref 0–37)
Bilirubin, Direct: 0.1 mg/dL (ref 0.0–0.3)
TOTAL PROTEIN: 7.7 g/dL (ref 6.0–8.3)
Total Bilirubin: 0.6 mg/dL (ref 0.2–1.2)

## 2018-03-16 LAB — T4, FREE: Free T4: 1.06 ng/dL (ref 0.60–1.60)

## 2018-03-16 LAB — TSH: TSH: 6.4 u[IU]/mL — ABNORMAL HIGH (ref 0.35–4.50)

## 2018-03-16 LAB — HEMOGLOBIN A1C: HEMOGLOBIN A1C: 6.7 % — AB (ref 4.6–6.5)

## 2018-03-25 ENCOUNTER — Ambulatory Visit: Payer: Medicare Other | Attending: Ophthalmology | Admitting: Occupational Therapy

## 2018-03-25 DIAGNOSIS — H53412 Scotoma involving central area, left eye: Secondary | ICD-10-CM | POA: Insufficient documentation

## 2018-03-25 NOTE — Patient Instructions (Addendum)
Talk with your bank regarding large print checks  Consider automatic bank drafts for bills that are the same amount each month  Pick up throw rugs to minimize risk for falls  Consider a referral to physical therapy to address falls-talk to Dr. Jenny Reichmann about this.  Consider meeting with counselor to manage stress  Use memory strategies such as a calendar and reminders for bills

## 2018-03-25 NOTE — Therapy (Signed)
[facility] [address] [place], [place], [address] Phone: [phone]   Fax:  [phone]  Occupational Therapy Evaluation  Patient Details  Name: Stephen Wilkinson MRN: [id number] Date of Birth: [birth year] Referring Provider (OT): Dr. Ellie Lunch   Encounter Date: 03/25/2018  OT End of Session - 03/25/18 0943    Visit Number  1    Number of Visits  1    Authorization Type  Medicare     OT Start Time  0941    OT Stop Time  1055    OT Time Calculation (min)  74 min       Past Medical History:  Diagnosis Date  . Acute blood loss anemia 05/15/2011   post surgical  . Altered mental status 05/15/2011  . Anxiety   . B12 deficiency 09/23/2016  . Blood transfusion without reported diagnosis   . Cancer ([facility])    basal and squamous skin carcinoma   . Cataract   . Chronic kidney disease    hx of uti due to catheter hx of prostatitis, BPH -   . Depression   . Diabetes mellitus   . GERD (gastroesophageal reflux disease)   . Glaucoma 01/04/2013  . Heart murmur   . History of nuclear stress test    Myoview 3/17: EF 62%, no ischemia or scar, low risk  . Hx of transient ischemic attack (TIA) 01/04/2013  . Hx: UTI (urinary tract infection)   . Hyperlipidemia   . Hypertension   . Hypokalemia 05/15/2011  . Hypothyroidism   . Insomnia   . Macular degeneration   . Neuropathy   . OA (osteoarthritis)   . Pneumonia   . PVC's (premature ventricular contractions)   . Renal mass   . Stroke ([facility])    mid 1990s     Past Surgical History:  Procedure Laterality Date  . APPENDECTOMY    . CATARACT EXTRACTION W/ INTRAOCULAR LENS  IMPLANT, BILATERAL    . INGUINAL HERNIA REPAIR     X3  . LUMBAR LAMINECTOMY/DECOMPRESSION MICRODISCECTOMY N/A 02/12/2017   Procedure: Lumbar Three-Four, Lumbar Four-Five, Lumbar Five-Sacral One Laminectomy and Foraminotomy;  Surgeon: Earnie Larsson, MD;  Location: [facility];  Service: Neurosurgery;  Laterality: N/A;  .  OTHER SURGICAL HISTORY     microwave procedure for prostate - 09/12   . PROSTATE ABLATION     microwave  . REPLACEMENT TOTAL KNEE    . THYROIDECTOMY    . TONSILLECTOMY AND ADENOIDECTOMY    . TOTAL KNEE ARTHROPLASTY  05/11/2011   Procedure: TOTAL KNEE ARTHROPLASTY;  Surgeon: Gearlean Alf;  [facility];  Service: Orthopedics;  Laterality: Right;  . US ECHOCARDIOGRAPHY  11/10/2006   EF 55-60%  . US ECHOCARDIOGRAPHY  09/20/2002   EF 65-70%    There were no vitals filed for this visit.  Subjective Assessment - 03/25/18 1604    Subjective   Pt reports difficulty reading due to macuular degeneration.    Pertinent History  see snapshot    Patient Stated Goals  read easier    Currently in Pain?  No/denies        [facility] OT Assessment - 03/25/18 0949      Assessment   Medical Diagnosis  macular degeneration    Referring Provider (OT)  Dr. Ellie Lunch    Onset Date/Surgical Date  02/22/18      Precautions   Precautions  Fall    Precaution Comments  low vision      Balance Screen  Has the patient fallen in the past 6 months  Yes    How many times?  6    Has the patient had a decrease in activity level because of a fear of falling?   Yes    Is the patient reluctant to leave their home because of a fear of falling?   No      Home  Environment   Family/patient expects to be discharged to:  Private residence    Lives With  Spouse      Prior Function   Level of [place]  Retired      ADL   ADL comments  modified indpendent with all basic [place] alone or with occasional assistance    Meal Prep  Able to complete simple cold meal and snack prep    Medication Management  Is responsible for taking medication in correct dosages at correct time    Financial Management  Requires assistance      Mobility   Mobility Status  Independent;History of falls      Written Expression   Handwriting  100% legible       Vision - History   Baseline Vision  Bifocals    Visual History  Macular degeneration    Patient Visual Report  Central vision impairment      Vision Assessment   Vision Assessment  Vision tested    Per MD/OD Report  OD 20/30, OS 20/CF    Reading Acuity  (0.5)    Patient has diffculty with activities due to visual impairment  Writing checks;Balancing checkbook;Reading bills      Cognition   Overall Cognitive Status  Within Functional Limits for tasks assessed    Mini Mental State Exam   25/30   WFLS, difficulty with short term recall          Treatment: Pt was instructed in use of his own handheld magnifier, a 3x stand magnifier and electronic video magnifier. Pt was able to read continuous text using all devices. Pt reports recent falls, therapist recommends referral to PT, however pt is not ready yet. Therapist recommends pt considers referral to psychology due to reports of increased anxiety. Therapist recommended pt removes all throw rugs to minimize fall risk. Recommended large print checks and use of auto draft for bill pay.           OT Education - 03/25/18 1616    Education Details  use of 3x stand magnfier, use of pt's own handheld magnifier, use of an Environmental consultant, recommendation that pt considers referrals to PT due to falls, removing throw rugs due to fall risk    Person(s) Educated  Patient    Methods  Explanation;Demonstration;Verbal cues    Comprehension  Verbalized understanding;Returned demonstration;Verbal cues required                 Plan - 03/25/18 1606    Clinical Impression Statement  Pt is an 84 y.o with macular degeneration who presents to occupational therapy with visual impairments which impede perfromance of ADLS/IADLS. Pt was seen for evaluation and education on day of eval to maximize pt's safety and independence.    Occupational Profile and client history currently impacting functional performance  retired,  lives with wife , I with basic ADLs, difficulty reading due to macular degeneration, see snapshot for PMH.    Occupational performance deficits (Please refer  to evaluation for details):  IADL's;ADL's    Rehab Potential  Good    Current Impairments/barriers affecting progress:  tremor, decreased balance, visual impairment    OT Frequency  One time visit    OT Duration  12 weeks    OT Treatment/Interventions  Self-care/ADL training;Visual/perceptual remediation/compensation;Patient/family education;DME and/or AE instruction    Plan  Pt was educated regarding use of adaptive equipment on day of evaluation as well as additional recommendations. Pt verbalized understanding of all education. No further visits needed at this time, therefore no goals set at this time.    Clinical Decision Making  Limited treatment options, no task modification necessary    Consulted and Agree with Plan of Care  Patient       Patient will benefit from skilled therapeutic intervention in order to improve the following deficits and impairments:  Impaired vision/preception, Decreased knowledge of precautions, Decreased safety awareness, Difficulty walking  Visit Diagnosis: No diagnosis found.    Problem List Patient Active Problem List   Diagnosis Date Noted  . Anxiety 09/25/2017  . Lumbar stenosis with neurogenic claudication 02/12/2017  . Lumbar spinal stenosis 11/24/2016  . Left sided sciatica 11/24/2016  . Renal cyst 11/24/2016  . Bilateral leg cramps 09/23/2016  . Nasal sore 09/23/2016  . B12 deficiency 09/23/2016  . CMC arthritis, thumb, degenerative 05/26/2016  . Memory dysfunction 03/24/2016  . Rash and nonspecific skin eruption 11/20/2014  . Cough 10/10/2014  . Sciatica of left side 08/21/2014  . GERD (gastroesophageal reflux disease) 01/04/2014  . Hearing loss, bilateral 10/18/2013  . Wrist arthritis 10/18/2013  . Preventative health care 03/30/2013  . Impaired vision in both eyes 03/30/2013  .  Erectile dysfunction 03/30/2013  . Skin cancer   . Hyperlipidemia   . Chronic cough 03/10/2013  . First degree heart block 01/04/2013  . DM type 2 with diabetic peripheral neuropathy ([facility]) 01/04/2013  . BPH (benign prostatic hyperplasia) 01/04/2013  . Hx of transient ischemic attack (TIA) 01/04/2013  . Glaucoma 01/04/2013  . Osteoarthritis of right knee 05/12/2011  . Hypothyroidism 12/25/2010  . Benign hypertensive heart disease without heart failure 12/25/2010  . Anxiety and depression 12/25/2010  . Macular degeneration of both eyes 12/25/2010    Kebron Pulse 03/25/2018, 4:18 PM  [facility] [address] [place], [place], [address] Phone: [phone]   Fax:  [phone]  Name: Stephen Wilkinson MRN: [id number] Date of Birth: July 29, 1928

## 2018-03-29 ENCOUNTER — Ambulatory Visit (INDEPENDENT_AMBULATORY_CARE_PROVIDER_SITE_OTHER): Payer: Medicare Other | Admitting: Internal Medicine

## 2018-03-29 ENCOUNTER — Encounter: Payer: Self-pay | Admitting: Internal Medicine

## 2018-03-29 VITALS — BP 116/78 | HR 70 | Temp 98.1°F | Ht 73.5 in | Wt 188.0 lb

## 2018-03-29 DIAGNOSIS — F419 Anxiety disorder, unspecified: Secondary | ICD-10-CM

## 2018-03-29 DIAGNOSIS — E7800 Pure hypercholesterolemia, unspecified: Secondary | ICD-10-CM | POA: Diagnosis not present

## 2018-03-29 DIAGNOSIS — H543 Unqualified visual loss, both eyes: Secondary | ICD-10-CM

## 2018-03-29 DIAGNOSIS — Z23 Encounter for immunization: Secondary | ICD-10-CM | POA: Diagnosis not present

## 2018-03-29 DIAGNOSIS — E1142 Type 2 diabetes mellitus with diabetic polyneuropathy: Secondary | ICD-10-CM

## 2018-03-29 DIAGNOSIS — E034 Atrophy of thyroid (acquired): Secondary | ICD-10-CM

## 2018-03-29 MED ORDER — GLUCOSE BLOOD VI STRP: ORAL_STRIP | 11 refills | Status: DC

## 2018-03-29 MED ORDER — CITALOPRAM HYDROBROMIDE 10 MG PO TABS: 10.0000 mg | ORAL_TABLET | Freq: Every day | ORAL | 3 refills | Status: DC

## 2018-03-29 MED ORDER — METFORMIN HCL 1000 MG PO TABS: 1000.0000 mg | ORAL_TABLET | Freq: Every day | ORAL | 3 refills | Status: DC

## 2018-03-29 MED ORDER — LEVOTHYROXINE SODIUM 112 MCG PO TABS: 112.0000 ug | ORAL_TABLET | Freq: Every day | ORAL | 3 refills | Status: DC

## 2018-03-29 MED ORDER — TIZANIDINE HCL 4 MG PO TABS: 4.0000 mg | ORAL_TABLET | Freq: Four times a day (QID) | ORAL | 1 refills | Status: DC | PRN

## 2018-03-29 MED ORDER — ALPRAZOLAM 0.25 MG PO TABS: 0.2500 mg | ORAL_TABLET | Freq: Two times a day (BID) | ORAL | 1 refills | Status: DC

## 2018-03-29 MED ORDER — FAMOTIDINE 20 MG PO TABS: 20.0000 mg | ORAL_TABLET | Freq: Two times a day (BID) | ORAL | 3 refills | Status: DC

## 2018-03-29 NOTE — Assessment & Plan Note (Signed)
stable overall by history and exam, recent data reviewed with pt, and pt to continue medical treatment as before,  to f/u any worsening symptoms or concerns  

## 2018-03-29 NOTE — Progress Notes (Signed)
Subjective:    Patient ID: Stephen Wilkinson, male    DOB: [birth year], 84 y.o.   MRN: [id number]  HPI  Here to f/u; overall doing ok,  Pt denies chest pain, increasing sob or doe, wheezing, orthopnea, PND, increased LE swelling, palpitations, dizziness or syncope.  Pt denies new neurological symptoms such as new headache, or facial or extremity weakness or numbness.  Pt denies polydipsia, polyuria, or low sugar episode.  Pt states overall good compliance with meds, mostly trying to follow appropriate diet, with wt overall up a few lbs as he is no longer as active due to lower vision.  Has worsening macular degeneration, now dx with low vision, requires magnifier for reading, and has fallen several times due to not being able to see well. Today has a knot and bruising to the right upper arm.  Denies hyper or hypo thyroid symptoms such as voice, skin or hair change.  Has had no LBP for 1 yr after Lumbar surgury per Dr Trenton Gammon.  Still feels some pain to most lateral abd sides worse with twisting at the waist or reaching overhead with the arm.  Almost most importantly, has been more nervous with shaky hands recently he thinks due to lower vision, wondering also about thyroid, especially after med increase after last visit , would consider celexa if thyroid is ok. Denies worsening depressive symptoms, suicidal ideation, or panic;   Wt Readings from Last 3 Encounters:  03/29/18 188 lb (85.3 kg)  03/11/18 189 lb (85.7 kg)  09/23/17 191 lb (86.6 kg)   Past Medical History:  Diagnosis Date  . Acute blood loss anemia 05/15/2011   post surgical  . Altered mental status 05/15/2011  . Anxiety   . B12 deficiency 09/23/2016  . Blood transfusion without reported diagnosis   . Cancer ([facility])    basal and squamous skin carcinoma   . Cataract   . Chronic kidney disease    hx of uti due to catheter hx of prostatitis, BPH -   . Depression   . Diabetes mellitus   . GERD (gastroesophageal reflux disease)   . Glaucoma  01/04/2013  . Heart murmur   . History of nuclear stress test    Myoview 3/17: EF 62%, no ischemia or scar, low risk  . Hx of transient ischemic attack (TIA) 01/04/2013  . Hx: UTI (urinary tract infection)   . Hyperlipidemia   . Hypertension   . Hypokalemia 05/15/2011  . Hypothyroidism   . Insomnia   . Macular degeneration   . Neuropathy   . OA (osteoarthritis)   . Pneumonia   . PVC's (premature ventricular contractions)   . Renal mass   . Stroke ([facility])    mid 1990s    Past Surgical History:  Procedure Laterality Date  . APPENDECTOMY    . CATARACT EXTRACTION W/ INTRAOCULAR LENS  IMPLANT, BILATERAL    . INGUINAL HERNIA REPAIR     X3  . LUMBAR LAMINECTOMY/DECOMPRESSION MICRODISCECTOMY N/A 02/12/2017   Procedure: Lumbar Three-Four, Lumbar Four-Five, Lumbar Five-Sacral One Laminectomy and Foraminotomy;  Surgeon: Earnie Larsson, MD;  Location: [facility];  Service: Neurosurgery;  Laterality: N/A;  . OTHER SURGICAL HISTORY     microwave procedure for prostate - 09/12   . PROSTATE ABLATION     microwave  . REPLACEMENT TOTAL KNEE    . THYROIDECTOMY    . TONSILLECTOMY AND ADENOIDECTOMY    . TOTAL KNEE ARTHROPLASTY  05/11/2011   Procedure: TOTAL KNEE ARTHROPLASTY;  Surgeon: Dione Plover  Aluisio;  [facility];  Service: Orthopedics;  Laterality: Right;  . US ECHOCARDIOGRAPHY  11/10/2006   EF 55-60%  . US ECHOCARDIOGRAPHY  09/20/2002   EF 65-70%    reports that he has never smoked. He has never used smokeless tobacco. He reports that he drinks about 7.0 standard drinks of alcohol per week. He reports that he does not use drugs. family history includes Arthritis in his mother; Diabetes in his brother and mother; Hypertension in his brother, father, and mother; Kidney disease in his father; Stroke in his father. No Known Allergies Current Outpatient Medications on File Prior to Visit  Medication Sig Dispense Refill  . amLODipine (NORVASC) 10 MG tablet Take 1 tablet (10 mg total) by mouth every  morning. 90 tablet 3  . aspirin 81 MG tablet Take 81 mg by mouth daily.    . Bevacizumab (AVASTIN IV) Place 1 drop into the right eye as directed. Every 6-8 weeks    . clobetasol cream (TEMOVATE) 9.44 % Apply 1 application topically daily as needed (for contact dermatitis). Contact dermatitis  3  . clopidogrel (PLAVIX) 75 MG tablet Take 1 tablet (75 mg total) by mouth daily. 90 tablet 3  . latanoprost (XALATAN) 0.005 % ophthalmic solution Place 1 drop into both eyes at bedtime.     . metoprolol succinate (TOPROL-XL) 25 MG 24 hr tablet Take 1 tablet (25 mg total) by mouth every morning. 90 tablet 3  . Multiple Vitamins-Minerals (ICAPS AREDS 2 PO) Take 2 capsules by mouth daily.     No current facility-administered medications on file prior to visit.    Review of Systems  Constitutional: Negative for other unusual diaphoresis or sweats HENT: Negative for ear discharge or swelling Eyes: Negative for other worsening visual disturbances Respiratory: Negative for stridor or other swelling  Gastrointestinal: Negative for worsening distension or other blood Genitourinary: Negative for retention or other urinary change Musculoskeletal: Negative for other MSK pain or swelling Skin: Negative for color change or other new lesions Neurological: Negative for worsening tremors and other numbness  Psychiatric/Behavioral: Negative for worsening agitation or other fatigue All other system neg per pt    Objective:   Physical Exam BP 116/78   Pulse 70   Temp 98.1 F (36.7 C) (Oral)   Ht 6' 1.5" (1.867 m)   Wt 188 lb (85.3 kg)   SpO2 97%   BMI 24.47 kg/m  VS noted,  Constitutional: Pt appears in NAD HENT: Head: NCAT.  Right Ear: External ear normal.  Left Ear: External ear normal.  Eyes: . Pupils are equal, round, and reactive to light. Conjunctivae and EOM are normal Nose: without d/c or deformity Neck: Neck supple. Gross normal ROM Cardiovascular: Normal rate and regular rhythm.     Pulmonary/Chest: Effort normal and breath sounds without rales or wheezing.  Abd:  Soft, NT, ND, + BS, no organomegaly Neurological: Pt is alert. At baseline orientation, motor grossly intact Skin: Skin is warm. No rashes, other new lesions, no LE edema except for right upper arm near bicipital tendon with bruising and 1/2 cm tender hematoma like mass Psychiatric: Pt behavior is normal without agitation but with moderate nervous, not depressed affect No other exam findings Lab Results  Component Value Date   WBC 5.9 09/17/2017   HGB 15.3 09/17/2017   HCT 44.3 09/17/2017   PLT 233.0 09/17/2017   GLUCOSE 178 (H) 03/16/2018   CHOL 193 03/16/2018   TRIG 108.0 03/16/2018   HDL 69.30 03/16/2018  LDLCALC 102 (H) 03/16/2018   ALT 16 03/16/2018   AST 19 03/16/2018   NA 139 03/16/2018   K 4.2 03/16/2018   CL 104 03/16/2018   CREATININE 1.42 03/16/2018   BUN 20 03/16/2018   CO2 22 03/16/2018   TSH 6.40 (H) 03/16/2018   INR 2.16 (H) 05/15/2011   HGBA1C 6.7 (H) 03/16/2018   MICROALBUR 0.7 09/17/2017       Assessment & Plan:

## 2018-03-29 NOTE — Assessment & Plan Note (Signed)
With recent worsening, cont optho as planned

## 2018-03-29 NOTE — Assessment & Plan Note (Addendum)
Ok for celexa 10 qd  Note:  Total time for pt hx, exam, review of record with pt in the room, determination of diagnoses and plan for further eval and tx is > 40 min, with over 50% spent in coordination and counseling of patient including the differential dx, tx, further evaluation and other management of anxiety, low vision, DM, HLD, hypothyroidism

## 2018-03-29 NOTE — Patient Instructions (Addendum)
Please take all new medication as prescribed - the celexa 10 mg per day  Please continue all other medications as before, and refills have been done if requested.  Please have the pharmacy call with any other refills you may need.  Please continue your efforts at being more active, low cholesterol diet, and weight control.  Please keep your appointments with your specialists as you may have planned  Please return in 6 months, or sooner if needed

## 2018-04-05 DIAGNOSIS — C44219 Basal cell carcinoma of skin of left ear and external auricular canal: Secondary | ICD-10-CM | POA: Diagnosis not present

## 2018-04-05 DIAGNOSIS — L821 Other seborrheic keratosis: Secondary | ICD-10-CM | POA: Diagnosis not present

## 2018-04-12 ENCOUNTER — Other Ambulatory Visit: Payer: Self-pay

## 2018-04-12 DIAGNOSIS — I119 Hypertensive heart disease without heart failure: Secondary | ICD-10-CM

## 2018-04-12 MED ORDER — METOPROLOL SUCCINATE ER 25 MG PO TB24: 25.0000 mg | ORAL_TABLET | ORAL | 3 refills | Status: DC

## 2018-04-12 MED ORDER — METOPROLOL SUCCINATE ER 25 MG PO TB24: 25.0000 mg | ORAL_TABLET | ORAL | 0 refills | Status: DC

## 2018-04-29 DIAGNOSIS — H35051 Retinal neovascularization, unspecified, right eye: Secondary | ICD-10-CM | POA: Diagnosis not present

## 2018-04-29 DIAGNOSIS — H43391 Other vitreous opacities, right eye: Secondary | ICD-10-CM | POA: Diagnosis not present

## 2018-04-29 DIAGNOSIS — H353211 Exudative age-related macular degeneration, right eye, with active choroidal neovascularization: Secondary | ICD-10-CM | POA: Diagnosis not present

## 2018-05-03 DIAGNOSIS — Z08 Encounter for follow-up examination after completed treatment for malignant neoplasm: Secondary | ICD-10-CM | POA: Diagnosis not present

## 2018-05-03 DIAGNOSIS — Z85828 Personal history of other malignant neoplasm of skin: Secondary | ICD-10-CM | POA: Diagnosis not present

## 2018-05-20 ENCOUNTER — Ambulatory Visit (INDEPENDENT_AMBULATORY_CARE_PROVIDER_SITE_OTHER): Payer: Medicare Other | Admitting: Podiatry

## 2018-05-20 ENCOUNTER — Encounter: Payer: Self-pay | Admitting: Podiatry

## 2018-05-20 DIAGNOSIS — D689 Coagulation defect, unspecified: Secondary | ICD-10-CM

## 2018-05-20 DIAGNOSIS — E1140 Type 2 diabetes mellitus with diabetic neuropathy, unspecified: Secondary | ICD-10-CM

## 2018-05-20 DIAGNOSIS — B351 Tinea unguium: Secondary | ICD-10-CM | POA: Diagnosis not present

## 2018-05-20 DIAGNOSIS — M79676 Pain in unspecified toe(s): Secondary | ICD-10-CM | POA: Diagnosis not present

## 2018-05-20 DIAGNOSIS — E1142 Type 2 diabetes mellitus with diabetic polyneuropathy: Secondary | ICD-10-CM

## 2018-05-20 NOTE — Progress Notes (Signed)
Patient ID: Stephen Wilkinson, male   DOB: January 13, 1929, 84 y.o.   MRN: [id number] Complaint:  Visit Type: Patient returns to my office for continued preventative foot care services. Complaint: Patient states" my nails have grown long and thick and become painful to walk and wear shoes" Patient has been diagnosed with DM with no foot complications. The patient presents for preventative foot care services. No changes to ROS.  He has macular degeneration with history of mother and grandmother losing their legs to diabetes. Patient is taking plavix.  Podiatric Exam: Vascular: dorsalis pedis and posterior tibial pulses are palpable bilateral. Capillary return is immediate. Temperature gradient is WNL. Skin turgor WNL  Sensorium: Absent  Semmes Weinstein monofilament test. Normal tactile sensation bilaterally. Nail Exam: Pt has thick disfigured discolored nails with subungual debris noted bilateral entire nail hallux through fifth toenails Ulcer Exam: There is no evidence of ulcer or pre-ulcerative changes or infection. Orthopedic Exam: Muscle tone and strength are WNL. No limitations in general ROM. No crepitus or effusions noted. Foot type and digits show no abnormalities. Bony prominences are unremarkable. HAV B/L. Skin: . No infection or ulcers.Asymptomatic porokeratosis sub 5th right.  Diagnosis:  Onychomycosis, , Pain in right toe, pain in left toes,  Treatment & Plan Procedures and Treatment: Consent by patient was obtained for treatment procedures. The patient understood the discussion of treatment and procedures well. All questions were answered thoroughly reviewed. Debridement of mycotic and hypertrophic toenails, 1 through 5 bilateral and clearing of subungual debris. No ulceration, no infection noted. ABN signed for 2019.  Return Visit-Office Procedure: Patient instructed to return to the office for a follow up visit 10 weeks  for continued evaluation and treatment.  Gardiner Barefoot DPM

## 2018-06-13 DIAGNOSIS — H35372 Puckering of macula, left eye: Secondary | ICD-10-CM | POA: Diagnosis not present

## 2018-06-13 DIAGNOSIS — H353124 Nonexudative age-related macular degeneration, left eye, advanced atrophic with subfoveal involvement: Secondary | ICD-10-CM | POA: Diagnosis not present

## 2018-06-13 DIAGNOSIS — H353113 Nonexudative age-related macular degeneration, right eye, advanced atrophic without subfoveal involvement: Secondary | ICD-10-CM | POA: Diagnosis not present

## 2018-06-13 DIAGNOSIS — H353211 Exudative age-related macular degeneration, right eye, with active choroidal neovascularization: Secondary | ICD-10-CM | POA: Diagnosis not present

## 2018-06-13 DIAGNOSIS — H43391 Other vitreous opacities, right eye: Secondary | ICD-10-CM | POA: Diagnosis not present

## 2018-07-05 ENCOUNTER — Other Ambulatory Visit: Payer: Self-pay | Admitting: Internal Medicine

## 2018-07-29 ENCOUNTER — Ambulatory Visit: Payer: Medicare Other | Admitting: Podiatry

## 2018-08-03 DIAGNOSIS — H353211 Exudative age-related macular degeneration, right eye, with active choroidal neovascularization: Secondary | ICD-10-CM | POA: Diagnosis not present

## 2018-08-03 DIAGNOSIS — H353124 Nonexudative age-related macular degeneration, left eye, advanced atrophic with subfoveal involvement: Secondary | ICD-10-CM | POA: Diagnosis not present

## 2018-08-03 DIAGNOSIS — H35352 Cystoid macular degeneration, left eye: Secondary | ICD-10-CM | POA: Diagnosis not present

## 2018-08-03 DIAGNOSIS — H43391 Other vitreous opacities, right eye: Secondary | ICD-10-CM | POA: Diagnosis not present

## 2018-08-03 DIAGNOSIS — H353113 Nonexudative age-related macular degeneration, right eye, advanced atrophic without subfoveal involvement: Secondary | ICD-10-CM | POA: Diagnosis not present

## 2018-08-03 DIAGNOSIS — H35372 Puckering of macula, left eye: Secondary | ICD-10-CM | POA: Diagnosis not present

## 2018-08-10 ENCOUNTER — Encounter: Payer: Self-pay | Admitting: Podiatry

## 2018-08-10 ENCOUNTER — Ambulatory Visit (INDEPENDENT_AMBULATORY_CARE_PROVIDER_SITE_OTHER): Payer: Medicare Other | Admitting: Podiatry

## 2018-08-10 DIAGNOSIS — E1140 Type 2 diabetes mellitus with diabetic neuropathy, unspecified: Secondary | ICD-10-CM

## 2018-08-10 DIAGNOSIS — M79676 Pain in unspecified toe(s): Secondary | ICD-10-CM

## 2018-08-10 DIAGNOSIS — D689 Coagulation defect, unspecified: Secondary | ICD-10-CM | POA: Diagnosis not present

## 2018-08-10 DIAGNOSIS — B351 Tinea unguium: Secondary | ICD-10-CM

## 2018-08-10 NOTE — Progress Notes (Signed)
Patient ID: Stephen Wilkinson, male   DOB: September 16, 1928, 85 y.o.   MRN: [id number] Complaint:  Visit Type: Patient returns to my office for continued preventative foot care services. Complaint: Patient states" my nails have grown long and thick and become painful to walk and wear shoes" Patient has been diagnosed with DM with no foot complications. The patient presents for preventative foot care services. No changes to ROS.  He has macular degeneration with history of mother and grandmother losing their legs to diabetes. Patient is taking plavix.  Podiatric Exam: Vascular: dorsalis pedis and posterior tibial pulses are palpable bilateral. Capillary return is immediate. Temperature gradient is WNL. Skin turgor WNL  Sensorium: Absent  Semmes Weinstein monofilament test. Normal tactile sensation bilaterally. Nail Exam: Pt has thick disfigured discolored nails with subungual debris noted bilateral entire nail hallux through fifth toenails Ulcer Exam: There is no evidence of ulcer or pre-ulcerative changes or infection. Orthopedic Exam: Muscle tone and strength are WNL. No limitations in general ROM. No crepitus or effusions noted. Foot type and digits show no abnormalities. Bony prominences are unremarkable. HAV B/L. Skin: . No infection or ulcers.Asymptomatic porokeratosis sub 5th right.  Diagnosis:  Onychomycosis, , Pain in right toe, pain in left toes,  Treatment & Plan Procedures and Treatment: Consent by patient was obtained for treatment procedures. The patient understood the discussion of treatment and procedures well. All questions were answered thoroughly reviewed. Debridement of mycotic and hypertrophic toenails, 1 through 5 bilateral and clearing of subungual debris. No ulceration, no infection noted. Return Visit-Office Procedure: Patient instructed to return to the office for a follow up visit 12 weeks  for continued evaluation and treatment.  Gardiner Barefoot DPM

## 2018-08-18 ENCOUNTER — Ambulatory Visit: Payer: Medicare Other | Admitting: Podiatry

## 2018-08-19 NOTE — Progress Notes (Signed)
HPI: Follow-up hypertension and palpitations. Also history of TIA. Echo 2008 showed normal LV function; grade 1 DD; mild LVH; trace AI; mild MR and TR. Nuclear study March 2017 showed ejection fraction 62% and no ischemia. Since last seen,there is no exertional chest pain.  No significant orthopnea, PND, pedal edema or syncope.  Mild dyspnea with more vigorous activities.  Current Outpatient Medications  Medication Sig Dispense Refill  . ALPRAZolam (XANAX) 0.25 MG tablet Take 1 tablet (0.25 mg total) by mouth 2 (two) times daily. 180 tablet 1  . amLODipine (NORVASC) 10 MG tablet Take 1 tablet (10 mg total) by mouth every morning. 90 tablet 3  . aspirin 81 MG tablet Take 81 mg by mouth daily.    . Bevacizumab (AVASTIN IV) Place 1 drop into the right eye as directed. Every 6-8 weeks    . citalopram (CELEXA) 10 MG tablet Take 1 tablet (10 mg total) by mouth daily. 90 tablet 3  . clobetasol cream (TEMOVATE) 4.25 % Apply 1 application topically daily as needed (for contact dermatitis). Contact dermatitis  3  . clopidogrel (PLAVIX) 75 MG tablet Take 1 tablet (75 mg total) by mouth daily. 90 tablet 3  . famotidine (PEPCID) 20 MG tablet Take 1 tablet (20 mg total) by mouth 2 (two) times daily. 180 tablet 3  . glucose blood (ONE TOUCH ULTRA TEST) test strip Use as directed once daily to check blood sugar.  Diagnosis code E11.42 100 each 11  . latanoprost (XALATAN) 0.005 % ophthalmic solution Place 1 drop into both eyes at bedtime.     Marland Kitchen levothyroxine (SYNTHROID, LEVOTHROID) 112 MCG tablet TAKE 1 TABLET EVERY DAY 90 tablet 1  . metFORMIN (GLUCOPHAGE) 1000 MG tablet Take 1 tablet (1,000 mg total) by mouth daily. 90 tablet 3  . metoprolol succinate (TOPROL-XL) 25 MG 24 hr tablet Take 1 tablet (25 mg total) by mouth every morning. 30 tablet 0  . Multiple Vitamins-Minerals (ICAPS AREDS 2 PO) Take 2 capsules by mouth daily.    Marland Kitchen tiZANidine (ZANAFLEX) 4 MG tablet Take 1 tablet (4 mg total) by mouth  every 6 (six) hours as needed for muscle spasms. 600 tablet 1   No current facility-administered medications for this visit.      Past Medical History:  Diagnosis Date  . Acute blood loss anemia 05/15/2011   post surgical  . Altered mental status 05/15/2011  . Anxiety   . B12 deficiency 09/23/2016  . Blood transfusion without reported diagnosis   . Cancer ([place])    basal and squamous skin carcinoma   . Cataract   . Chronic kidney disease    hx of uti due to catheter hx of prostatitis, BPH -   . Depression   . Diabetes mellitus   . GERD (gastroesophageal reflux disease)   . Glaucoma 01/04/2013  . Heart murmur   . History of nuclear stress test    Myoview 3/17: EF 62%, no ischemia or scar, low risk  . Hx of transient ischemic attack (TIA) 01/04/2013  . Hx: UTI (urinary tract infection)   . Hyperlipidemia   . Hypertension   . Hypokalemia 05/15/2011  . Hypothyroidism   . Insomnia   . Macular degeneration   . Neuropathy   . OA (osteoarthritis)   . Pneumonia   . PVC's (premature ventricular contractions)   . Renal mass   . Stroke ([place])    mid 1990s     Past Surgical History:  Procedure Laterality Date  .  APPENDECTOMY    . CATARACT EXTRACTION W/ INTRAOCULAR LENS  IMPLANT, BILATERAL    . INGUINAL HERNIA REPAIR     X3  . LUMBAR LAMINECTOMY/DECOMPRESSION MICRODISCECTOMY N/A 02/12/2017   Procedure: Lumbar Three-Four, Lumbar Four-Five, Lumbar Five-Sacral One Laminectomy and Foraminotomy;  Surgeon: Earnie Larsson, MD;  Location: [facility];  Service: Neurosurgery;  Laterality: N/A;  . OTHER SURGICAL HISTORY     microwave procedure for prostate - 09/12   . PROSTATE ABLATION     microwave  . REPLACEMENT TOTAL KNEE    . THYROIDECTOMY    . TONSILLECTOMY AND ADENOIDECTOMY    . TOTAL KNEE ARTHROPLASTY  05/11/2011   Procedure: TOTAL KNEE ARTHROPLASTY;  Surgeon: Gearlean Alf;  Location: [facility];  Service: Orthopedics;  Laterality: Right;  . US ECHOCARDIOGRAPHY  11/10/2006   EF 55-60%  .  US ECHOCARDIOGRAPHY  09/20/2002   EF 65-70%    Social History   Socioeconomic History  . Marital status: Married    Spouse name: Not on file  . Number of children: 3  . Years of education: Not on file  . Highest education level: Not on file  Occupational History  . Occupation: retired    Fish farm manager: RETIRED    Comment: [employer]  Social Needs  . Financial resource strain: Not on file  . Food insecurity:    Worry: Not on file    Inability: Not on file  . Transportation needs:    Medical: Not on file    Non-medical: Not on file  Tobacco Use  . Smoking status: Never Smoker  . Smokeless tobacco: Never Used  Substance and Sexual Activity  . Alcohol use: Yes    Alcohol/week: 7.0 standard drinks    Types: 7 Shots of liquor per week    Comment: 2 drinks most days  . Drug use: No  . Sexual activity: Never    Birth control/protection: None  Lifestyle  . Physical activity:    Days per week: Not on file    Minutes per session: Not on file  . Stress: Not on file  Relationships  . Social connections:    Talks on phone: Not on file    Gets together: Not on file    Attends religious service: Not on file    Active member of club or organization: Not on file    Attends meetings of clubs or organizations: Not on file    Relationship status: Not on file  . Intimate partner violence:    Fear of current or ex partner: Not on file    Emotionally abused: Not on file    Physically abused: Not on file    Forced sexual activity: Not on file  Other Topics Concern  . 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 Hypertension Mother   . Arthritis Mother   . Diabetes Mother   . Hypertension Father   . Stroke Father   . Kidney  disease Father   . Hypertension Brother   . Diabetes Brother     ROS: no fevers or chills, productive cough, hemoptysis, dysphasia, odynophagia, melena, hematochezia, dysuria,  hematuria, rash, seizure activity, orthopnea, PND, pedal edema, claudication. Remaining systems are negative.  Physical Exam: Well-developed well-nourished in no acute distress.  Skin is warm and dry.  HEENT is normal.  Neck is supple.  Chest is clear to auscultation with normal expansion.  Cardiovascular exam is regular rate and rhythm.  Abdominal exam nontender or distended. No masses palpated. Extremities show no edema. neuro grossly intact  ECG-normal sinus rhythm at a rate of 61, left axis deviation, no significant ST changes.  Cannot rule out septal infarct.  Personally reviewed  A/P  1 hypertension-patient's blood pressure is controlled today.  Plan to continue present medications and follow.  2 prior TIA-continue present management including aspirin and Plavix.  3 hyperlipidemia-followed by primary care.  4 history of palpitations-continue beta-blocker.  Kirk Ruths, MD

## 2018-08-29 ENCOUNTER — Encounter: Payer: Self-pay | Admitting: Cardiology

## 2018-08-29 ENCOUNTER — Other Ambulatory Visit: Payer: Self-pay

## 2018-08-29 ENCOUNTER — Ambulatory Visit (INDEPENDENT_AMBULATORY_CARE_PROVIDER_SITE_OTHER): Payer: Medicare Other | Admitting: Cardiology

## 2018-08-29 VITALS — BP 118/64 | HR 61 | Ht 73.5 in | Wt 192.0 lb

## 2018-08-29 DIAGNOSIS — R002 Palpitations: Secondary | ICD-10-CM | POA: Diagnosis not present

## 2018-08-29 DIAGNOSIS — E7800 Pure hypercholesterolemia, unspecified: Secondary | ICD-10-CM

## 2018-08-29 DIAGNOSIS — I119 Hypertensive heart disease without heart failure: Secondary | ICD-10-CM | POA: Diagnosis not present

## 2018-08-29 MED ORDER — AMLODIPINE BESYLATE 10 MG PO TABS: 10.0000 mg | ORAL_TABLET | ORAL | 3 refills | Status: DC

## 2018-08-29 MED ORDER — METOPROLOL SUCCINATE ER 25 MG PO TB24: 25.0000 mg | ORAL_TABLET | ORAL | 3 refills | Status: DC

## 2018-08-29 MED ORDER — CLOPIDOGREL BISULFATE 75 MG PO TABS: 75.0000 mg | ORAL_TABLET | Freq: Every day | ORAL | 3 refills | Status: DC

## 2018-08-29 NOTE — Patient Instructions (Signed)
Medication Instructions: NO CHANGE If you need a refill on your cardiac medications before your next appointment, please call your pharmacy.   Lab work: If you have labs (blood work) drawn today and your tests are completely normal, you will receive your results only by: Marland Kitchen MyChart Message (if you have MyChart) OR . A paper copy in the mail If you have any lab test that is abnormal or we need to change your treatment, we will call you to review the results.  Follow-Up: At [facility], you and your health needs are our priority.  As part of our continuing mission to provide you with exceptional heart care, we have created designated Provider Care Teams.  These Care Teams include your primary Cardiologist (physician) and Advanced Practice Providers (APPs -  Physician Assistants and Nurse Practitioners) who all work together to provide you with the care you need, when you need it. You will need a follow up appointment in 6 months.  Please call our office 2 months in advance to schedule this appointment.  You may see Kirk Ruths MD or one of the following Advanced Practice Providers on your designated Care Team:   Kerin Ransom, PA-C Roby Lofts, [place] . Sande Rives, PA-C  CALL IN July TO SCHEDULE APPOINTMENT IN [place]

## 2018-09-21 DIAGNOSIS — H35352 Cystoid macular degeneration, left eye: Secondary | ICD-10-CM | POA: Diagnosis not present

## 2018-09-21 DIAGNOSIS — H353211 Exudative age-related macular degeneration, right eye, with active choroidal neovascularization: Secondary | ICD-10-CM | POA: Diagnosis not present

## 2018-09-21 DIAGNOSIS — H353113 Nonexudative age-related macular degeneration, right eye, advanced atrophic without subfoveal involvement: Secondary | ICD-10-CM | POA: Diagnosis not present

## 2018-09-27 ENCOUNTER — Telehealth: Payer: Self-pay | Admitting: Internal Medicine

## 2018-09-27 NOTE — Telephone Encounter (Signed)
[place] for OV, thanks

## 2018-09-27 NOTE — Telephone Encounter (Signed)
Called patient and informed

## 2018-09-27 NOTE — Telephone Encounter (Signed)
Pt can be set up for a phone visit instead.

## 2018-09-27 NOTE — Telephone Encounter (Signed)
Patient does not have video capabilities and said that he is partially blind.  He also has a bump on his leg that he is concerned about. He would like to come in if possible.  Please advise.

## 2018-09-27 NOTE — Telephone Encounter (Signed)
Patient called regarding his appointment on Friday with Dr Jenny Reichmann. He was told about virtual visits but was confused about how to proceed with labs.  Please advise.

## 2018-09-30 ENCOUNTER — Ambulatory Visit (INDEPENDENT_AMBULATORY_CARE_PROVIDER_SITE_OTHER): Payer: Medicare Other | Admitting: Internal Medicine

## 2018-09-30 ENCOUNTER — Encounter: Payer: Self-pay | Admitting: Internal Medicine

## 2018-09-30 ENCOUNTER — Telehealth: Payer: Self-pay

## 2018-09-30 ENCOUNTER — Other Ambulatory Visit: Payer: Self-pay

## 2018-09-30 ENCOUNTER — Ambulatory Visit (INDEPENDENT_AMBULATORY_CARE_PROVIDER_SITE_OTHER)
Admission: RE | Admit: 2018-09-30 | Discharge: 2018-09-30 | Disposition: A | Discharge status: Home / Self Care | Payer: Medicare Other | Source: Ambulatory Visit | Attending: Internal Medicine | Admitting: Internal Medicine

## 2018-09-30 ENCOUNTER — Other Ambulatory Visit (INDEPENDENT_AMBULATORY_CARE_PROVIDER_SITE_OTHER): Payer: Medicare Other

## 2018-09-30 VITALS — BP 144/96 | HR 71 | Temp 97.9°F | Ht 73.5 in | Wt 195.0 lb

## 2018-09-30 DIAGNOSIS — G3281 Cerebellar ataxia in diseases classified elsewhere: Secondary | ICD-10-CM

## 2018-09-30 DIAGNOSIS — R42 Dizziness and giddiness: Secondary | ICD-10-CM | POA: Diagnosis not present

## 2018-09-30 DIAGNOSIS — I119 Hypertensive heart disease without heart failure: Secondary | ICD-10-CM | POA: Diagnosis not present

## 2018-09-30 DIAGNOSIS — Z8673 Personal history of transient ischemic attack (TIA), and cerebral infarction without residual deficits: Secondary | ICD-10-CM

## 2018-09-30 DIAGNOSIS — E559 Vitamin D deficiency, unspecified: Secondary | ICD-10-CM | POA: Diagnosis not present

## 2018-09-30 DIAGNOSIS — E1142 Type 2 diabetes mellitus with diabetic polyneuropathy: Secondary | ICD-10-CM

## 2018-09-30 DIAGNOSIS — R5383 Other fatigue: Secondary | ICD-10-CM

## 2018-09-30 DIAGNOSIS — H3530 Unspecified macular degeneration: Secondary | ICD-10-CM | POA: Diagnosis not present

## 2018-09-30 DIAGNOSIS — E034 Atrophy of thyroid (acquired): Secondary | ICD-10-CM | POA: Diagnosis not present

## 2018-09-30 DIAGNOSIS — R2689 Other abnormalities of gait and mobility: Secondary | ICD-10-CM

## 2018-09-30 DIAGNOSIS — F419 Anxiety disorder, unspecified: Secondary | ICD-10-CM

## 2018-09-30 DIAGNOSIS — R2242 Localized swelling, mass and lump, left lower limb: Secondary | ICD-10-CM

## 2018-09-30 DIAGNOSIS — E538 Deficiency of other specified B group vitamins: Secondary | ICD-10-CM

## 2018-09-30 DIAGNOSIS — R55 Syncope and collapse: Secondary | ICD-10-CM

## 2018-09-30 DIAGNOSIS — E039 Hypothyroidism, unspecified: Secondary | ICD-10-CM

## 2018-09-30 LAB — CBC WITH DIFFERENTIAL/PLATELET
Basophils Absolute: 0.1 10*3/uL (ref 0.0–0.1)
Basophils Relative: 0.9 % (ref 0.0–3.0)
Eosinophils Absolute: 0.1 10*3/uL (ref 0.0–0.7)
Eosinophils Relative: 2 % (ref 0.0–5.0)
HCT: 45.8 % (ref 39.0–52.0)
Hemoglobin: 16 g/dL (ref 13.0–17.0)
Lymphocytes Relative: 18.7 % (ref 12.0–46.0)
Lymphs Abs: 1.2 10*3/uL (ref 0.7–4.0)
MCHC: 35 g/dL (ref 30.0–36.0)
MCV: 98.9 fl (ref 78.0–100.0)
Monocytes Absolute: 0.6 10*3/uL (ref 0.1–1.0)
Monocytes Relative: 9.1 % (ref 3.0–12.0)
Neutro Abs: 4.4 10*3/uL (ref 1.4–7.7)
Neutrophils Relative %: 69.3 % (ref 43.0–77.0)
Platelets: 236 10*3/uL (ref 150.0–400.0)
RBC: 4.63 Mil/uL (ref 4.22–5.81)
RDW: 13.5 % (ref 11.5–15.5)
WBC: 6.3 10*3/uL (ref 4.0–10.5)

## 2018-09-30 LAB — HEPATIC FUNCTION PANEL
ALT: 15 U/L (ref 0–53)
AST: 20 U/L (ref 0–37)
Albumin: 4.7 g/dL (ref 3.5–5.2)
Alkaline Phosphatase: 74 U/L (ref 39–117)
Bilirubin, Direct: 0.1 mg/dL (ref 0.0–0.3)
Total Bilirubin: 0.6 mg/dL (ref 0.2–1.2)
Total Protein: 7.8 g/dL (ref 6.0–8.3)

## 2018-09-30 LAB — BASIC METABOLIC PANEL
BUN: 15 mg/dL (ref 6–23)
CO2: 27 mEq/L (ref 19–32)
Calcium: 10 mg/dL (ref 8.4–10.5)
Chloride: 103 mEq/L (ref 96–112)
Creatinine, Ser: 1.36 mg/dL (ref 0.40–1.50)
GFR: 49.43 mL/min — ABNORMAL LOW (ref >60.00)
Glucose, Bld: 124 mg/dL — ABNORMAL HIGH (ref 70–99)
Potassium: 4.6 mEq/L (ref 3.5–5.1)
Sodium: 142 mEq/L (ref 135–145)

## 2018-09-30 LAB — VITAMIN D 25 HYDROXY (VIT D DEFICIENCY, FRACTURES): VITD: 34.15 ng/mL (ref 30.00–100.00)

## 2018-09-30 LAB — CORTISOL: Cortisol, Plasma: 11.9 ug/dL

## 2018-09-30 LAB — TSH: TSH: 35.05 u[IU]/mL — ABNORMAL HIGH (ref 0.35–4.50)

## 2018-09-30 LAB — LIPID PANEL
Cholesterol: 215 mg/dL — ABNORMAL HIGH (ref 0–200)
HDL: 70.4 mg/dL (ref >39.00)
LDL Cholesterol: 117 mg/dL — ABNORMAL HIGH (ref 0–99)
NonHDL: 144.55
Total CHOL/HDL Ratio: 3
Triglycerides: 140 mg/dL (ref 0.0–149.0)
VLDL: 28 mg/dL (ref 0.0–40.0)

## 2018-09-30 LAB — VITAMIN B12: Vitamin B-12: 1439 pg/mL — ABNORMAL HIGH (ref 211–911)

## 2018-09-30 LAB — HEMOGLOBIN A1C: Hgb A1c MFr Bld: 6.9 % — ABNORMAL HIGH (ref 4.6–6.5)

## 2018-09-30 MED ORDER — METFORMIN HCL 1000 MG PO TABS: 1000.0000 mg | ORAL_TABLET | Freq: Every day | ORAL | 3 refills | Status: DC

## 2018-09-30 MED ORDER — FAMOTIDINE 20 MG PO TABS: 20.0000 mg | ORAL_TABLET | Freq: Two times a day (BID) | ORAL | 3 refills | Status: DC

## 2018-09-30 MED ORDER — AMLODIPINE BESYLATE 10 MG PO TABS: 10.0000 mg | ORAL_TABLET | ORAL | 3 refills | Status: DC

## 2018-09-30 MED ORDER — METOPROLOL SUCCINATE ER 25 MG PO TB24: 25.0000 mg | ORAL_TABLET | ORAL | 3 refills | Status: DC

## 2018-09-30 MED ORDER — LEVOTHYROXINE SODIUM 112 MCG PO TABS: 112.0000 ug | ORAL_TABLET | Freq: Every day | ORAL | 3 refills | Status: DC

## 2018-09-30 MED ORDER — LEVOTHYROXINE SODIUM 125 MCG PO TABS: 125.0000 ug | ORAL_TABLET | Freq: Every day | ORAL | 3 refills | Status: DC

## 2018-09-30 MED ORDER — TIZANIDINE HCL 4 MG PO TABS: 4.0000 mg | ORAL_TABLET | Freq: Four times a day (QID) | ORAL | 1 refills | Status: DC | PRN

## 2018-09-30 MED ORDER — CLOPIDOGREL BISULFATE 75 MG PO TABS: 75.0000 mg | ORAL_TABLET | Freq: Every day | ORAL | 3 refills | Status: DC

## 2018-09-30 MED ORDER — CITALOPRAM HYDROBROMIDE 10 MG PO TABS: 10.0000 mg | ORAL_TABLET | Freq: Every day | ORAL | 3 refills | Status: DC

## 2018-09-30 NOTE — Telephone Encounter (Signed)
-----   Message from Biagio Borg, MD sent at 09/30/2018  4:02 PM EDT ----- Ok to let pt know - cxr is negative

## 2018-09-30 NOTE — Telephone Encounter (Signed)
We could refer him, but I'm not sure he really needs this,  OK to increase the levothyroxine from 112 to 125 mcg with repeat TSH., Free t4 in 4 wks (just go to lab)  We can still refer to endocrinology, its just that they would do about the same thing

## 2018-09-30 NOTE — Progress Notes (Signed)
Subjective:    Patient ID: Stephen Wilkinson, male    DOB: [birth year], 85 y.o.   MRN: [id number]  HPI  Here in person markedly nervous and talkative difficult historian, c/o 2-3 wks worsening dizziness, balance problem, gradually worsening macular degeneration vision loss, also worsening stamina and generalized weakness, tremulous shaky, Has been less active with wt gain due to fatigue, afraid of falling again, since her fell 1 mo ago on plavix, with intitial superficial bruising noted to left post thigh that tracked to the more distal leg now essentially resolved, but left with very large firm nontender fixed mass to left posteromedial  BP has been more up and down recently, does not want med change  BP Readings from Last 3 Encounters:  09/30/18 (!) 144/96  08/29/18 118/64  03/29/18 116/78   Wt Readings from Last 3 Encounters:  09/30/18 195 lb (88.5 kg)  08/29/18 192 lb (87.1 kg)  03/29/18 188 lb (85.3 kg)  Denies worsening depressive symptoms, suicidal ideation, or panic; has ongoing anxiety, now willing to try SSRI. Has cane at home but not yet using.   Past Medical History:  Diagnosis Date  . Acute blood loss anemia 05/15/2011   post surgical  . Altered mental status 05/15/2011  . Anxiety   . B12 deficiency 09/23/2016  . Blood transfusion without reported diagnosis   . Cancer ([place])    basal and squamous skin carcinoma   . Cataract   . Chronic kidney disease    hx of uti due to catheter hx of prostatitis, BPH -   . Depression   . Diabetes mellitus   . GERD (gastroesophageal reflux disease)   . Glaucoma 01/04/2013  . Heart murmur   . History of nuclear stress test    Myoview 3/17: EF 62%, no ischemia or scar, low risk  . Hx of transient ischemic attack (TIA) 01/04/2013  . Hx: UTI (urinary tract infection)   . Hyperlipidemia   . Hypertension   . Hypokalemia 05/15/2011  . Hypothyroidism   . Insomnia   . Macular degeneration   . Neuropathy   . OA (osteoarthritis)   . Pneumonia   .  PVC's (premature ventricular contractions)   . Renal mass   . Stroke ([place])    mid 1990s    Past Surgical History:  Procedure Laterality Date  . APPENDECTOMY    . CATARACT EXTRACTION W/ INTRAOCULAR LENS  IMPLANT, BILATERAL    . INGUINAL HERNIA REPAIR     X3  . LUMBAR LAMINECTOMY/DECOMPRESSION MICRODISCECTOMY N/A 02/12/2017   Procedure: Lumbar Three-Four, Lumbar Four-Five, Lumbar Five-Sacral One Laminectomy and Foraminotomy;  Surgeon: Earnie Larsson, MD;  Location: [facility];  Service: Neurosurgery;  Laterality: N/A;  . OTHER SURGICAL HISTORY     microwave procedure for prostate - 09/12   . PROSTATE ABLATION     microwave  . REPLACEMENT TOTAL KNEE    . THYROIDECTOMY    . TONSILLECTOMY AND ADENOIDECTOMY    . TOTAL KNEE ARTHROPLASTY  05/11/2011   Procedure: TOTAL KNEE ARTHROPLASTY;  Surgeon: Gearlean Alf;  Location: [facility];  Service: Orthopedics;  Laterality: Right;  . US ECHOCARDIOGRAPHY  11/10/2006   EF 55-60%  . US ECHOCARDIOGRAPHY  09/20/2002   EF 65-70%    reports that he has never smoked. He has never used smokeless tobacco. He reports current alcohol use of about 7.0 standard drinks of alcohol per week. He reports that he does not use drugs. family history includes Arthritis in his mother; Diabetes in  his brother and mother; Hypertension in his brother, father, and mother; Kidney disease in his father; Stroke in his father. No Known Allergies Current Outpatient Medications on File Prior to Visit  Medication Sig Dispense Refill  . ALPRAZolam (XANAX) 0.25 MG tablet Take 1 tablet (0.25 mg total) by mouth 2 (two) times daily. 180 tablet 1  . aspirin 81 MG tablet Take 81 mg by mouth daily.    . Bevacizumab (AVASTIN IV) Place 1 drop into the right eye as directed. Every 6-8 weeks    . clobetasol cream (TEMOVATE) 8.10 % Apply 1 application topically daily as needed (for contact dermatitis). Contact dermatitis  3  . glucose blood (ONE TOUCH ULTRA TEST) test strip Use as directed once daily to  check blood sugar.  Diagnosis code E11.42 100 each 11  . latanoprost (XALATAN) 0.005 % ophthalmic solution Place 1 drop into both eyes at bedtime.     . Multiple Vitamins-Minerals (ICAPS AREDS 2 PO) Take 2 capsules by mouth daily.     No current facility-administered medications on file prior to visit.    Review of Systems  Constitutional: Negative for other unusual diaphoresis or sweats HENT: Negative for ear discharge or swelling Eyes: Negative for other worsening visual disturbances Respiratory: Negative for stridor or other swelling  Gastrointestinal: Negative for worsening distension or other blood Genitourinary: Negative for retention or other urinary change Musculoskeletal: Negative for other MSK pain or swelling Skin: Negative for color change or other new lesions Neurological: Negative for worsening tremors and other numbness  Psychiatric/Behavioral: Negative for worsening agitation or other fatigue No other exam findings    Objective:   Physical Exam BP (!) 144/96   Pulse 71   Temp 97.9 F (36.6 C) (Oral)   Ht 6' 1.5" (1.867 m)   Wt 195 lb (88.5 kg)   SpO2 96%   BMI 25.38 kg/m  VS noted,  Constitutional: Pt appears in NAD HENT: Head: NCAT.  Right Ear: External ear normal.  Left Ear: External ear normal.  Eyes: . Pupils are equal, round, and reactive to light. Conjunctivae and EOM are normal Nose: without d/c or deformity Neck: Neck supple. Gross normal ROM Cardiovascular: Normal rate and regular rhythm.   Pulmonary/Chest: Effort normal and breath sounds without rales or wheezing.  Abd:  Soft, NT, ND, + BS, no organomegaly Neurological: Pt is alert. At baseline orientation, motor grossly intact but with generalized weakness, tremulous, shaky, some balance off with trying to stand up but stand up and up on exam table without assist Skin: Skin is warm. No rashes, other new lesions, no LE edema but has very large firm subq mass firm nonmobile 8 x 8 cm left postmedial  thigh without overlying skin change Psychiatric: Pt behavior is normal without agitation but mod to severe nervous Lab Results  Component Value Date   WBC 5.9 09/17/2017   HGB 15.3 09/17/2017   HCT 44.3 09/17/2017   PLT 233.0 09/17/2017   GLUCOSE 178 (H) 03/16/2018   CHOL 193 03/16/2018   TRIG 108.0 03/16/2018   HDL 69.30 03/16/2018   LDLCALC 102 (H) 03/16/2018   ALT 16 03/16/2018   AST 19 03/16/2018   NA 139 03/16/2018   K 4.2 03/16/2018   CL 104 03/16/2018   CREATININE 1.42 03/16/2018   BUN 20 03/16/2018   CO2 22 03/16/2018   TSH 6.40 (H) 03/16/2018   INR 2.16 (H) 05/15/2011   HGBA1C 6.7 (H) 03/16/2018   MICROALBUR 0.7 09/17/2017  Assessment & Plan:

## 2018-09-30 NOTE — Addendum Note (Signed)
Addended by: Biagio Borg on: 09/30/2018 04:01 PM   Modules accepted: Orders

## 2018-09-30 NOTE — Telephone Encounter (Signed)
Pt stated that he has been taking the medication as prescribed. He would like to know if you could also refer him to a specialist for his thyroid? Please advise.

## 2018-09-30 NOTE — Telephone Encounter (Signed)
-----   Message from Biagio Borg, MD sent at 09/30/2018  1:54 PM EDT ----- Madaline Brilliant for Stephen Wilkinson to contact pt -   All tests ok except the thyroid test shows much worse;  ?? Has he missed any doses of medication?  If he has, then we may just need to repeat, but if not, we need to change his prescription  Stephen Wilkinson to please inform pt

## 2018-09-30 NOTE — Telephone Encounter (Signed)
Pt has been informed and expressed understanding.  

## 2018-09-30 NOTE — Patient Instructions (Addendum)
Please take all new medication as prescribed - the celexa 10 mg per day for nerves  Please continue all other medications as before, and refills have been done if requested.  Please have the pharmacy call with any other refills you may need.  Please continue your efforts at being more active, low cholesterol diet, and weight control.  You are otherwise up to date with prevention measures today.  Please keep your appointments with your specialists as you may have planned  You will be contacted regarding the referral for: MRI for the head and left thigh, carotid testing, and echocardiogram for the heart  Please go to the XRAY Department in the Basement (go straight as you get off the elevator) for the x-ray testing  Please go to the LAB in the Basement (turn left off the elevator) for the tests to be done today  You will be contacted by phone if any changes need to be made immediately.  Otherwise, you will receive a letter about your results with an explanation, but please check with MyChart first.  Please remember to sign up for MyChart if you have not done so, as this will be important to you in the future with finding out test results, communicating by private email, and scheduling acute appointments online when needed.  Please call if you change your mind about having Physical Therapy evaluation  Please always walk with cane for safety  Please return in 6 months, or sooner if needed

## 2018-10-01 ENCOUNTER — Encounter: Payer: Self-pay | Admitting: Internal Medicine

## 2018-10-01 NOTE — Assessment & Plan Note (Signed)
Stable, to cont plavix

## 2018-10-01 NOTE — Assessment & Plan Note (Signed)
Mild elevated likely situational, cont same tx, monitor BP at home and next visit

## 2018-10-01 NOTE — Assessment & Plan Note (Addendum)
Etiology unclear, for labs as ordered, MRI, carotids, echo, and  to f/u any worsening symptoms or concerns, declines PT eval, needs to walk with cane at home at outside home  Note:  Total time for pt hx, exam, review of record with pt in the room, determination of diagnoses and plan for further eval and tx is > 40 min, with over 50% spent in coordination and counseling of patient including the differential dx, tx, further evaluation and other management of balance d/o, dizziness, DM, HTN, anxiety, hypothyroid

## 2018-10-01 NOTE — Assessment & Plan Note (Signed)
For MRI as above, declines neurology for now

## 2018-10-01 NOTE — Assessment & Plan Note (Signed)
stable overall by history and exam, recent data reviewed with pt, and pt to continue medical treatment as before,  to f/u any worsening symptoms or concerns  

## 2018-10-01 NOTE — Assessment & Plan Note (Addendum)
Gradually worsening recently, f/u optho as planned

## 2018-10-01 NOTE — Assessment & Plan Note (Signed)
Moderate to severe persistent with some situational worsening, to add celexa 10 qd

## 2018-10-01 NOTE — Assessment & Plan Note (Signed)
For f/u b12 level,  to f/u any worsening symptoms or concerns

## 2018-10-01 NOTE — Assessment & Plan Note (Signed)
stable overall by history and exam, recent data reviewed with pt, and pt to continue medical treatment as before,  to f/u any worsening symptoms or concerns,  

## 2018-10-03 ENCOUNTER — Telehealth: Payer: Self-pay | Admitting: *Deleted

## 2018-10-03 MED ORDER — LEVOTHYROXINE SODIUM 125 MCG PO TABS: 125.0000 ug | ORAL_TABLET | Freq: Every day | ORAL | 3 refills | Status: DC

## 2018-10-03 NOTE — Telephone Encounter (Signed)
Copied from [place] [id number]. Topic: General - Other >> Oct 03, 2018  9:01 AM Leward Quan A wrote: Reason for CRM: Patient called to say that he want Rx for levothyroxine (SYNTHROID) 125 MCG tablet to be sent to the [facility] on [address] because it will take too long for the medication to be delivered from the mail order pharmacy. Please advise He is also willing to come pick up the printed Rx at the office. Ph# [phone]

## 2018-10-03 NOTE — Telephone Encounter (Signed)
I have reviewed the patient chart and he is updated resent rx to [facility].Marland KitchenJohny Chess

## 2018-10-10 ENCOUNTER — Encounter (HOSPITAL_COMMUNITY): Payer: Medicare Other

## 2018-10-12 ENCOUNTER — Other Ambulatory Visit: Payer: Medicare Other

## 2018-10-21 ENCOUNTER — Ambulatory Visit (HOSPITAL_COMMUNITY)
Admission: RE | Admit: 2018-10-21 | Discharge: 2018-10-21 | Disposition: A | Discharge status: Home / Self Care | Payer: Medicare Other | Source: Ambulatory Visit | Attending: Internal Medicine | Admitting: Internal Medicine

## 2018-10-21 ENCOUNTER — Other Ambulatory Visit: Payer: Self-pay

## 2018-10-21 DIAGNOSIS — R2689 Other abnormalities of gait and mobility: Secondary | ICD-10-CM | POA: Diagnosis not present

## 2018-10-21 DIAGNOSIS — R42 Dizziness and giddiness: Secondary | ICD-10-CM | POA: Diagnosis not present

## 2018-10-22 ENCOUNTER — Encounter: Payer: Self-pay | Admitting: Internal Medicine

## 2018-10-27 ENCOUNTER — Other Ambulatory Visit (INDEPENDENT_AMBULATORY_CARE_PROVIDER_SITE_OTHER): Payer: Medicare Other

## 2018-10-27 ENCOUNTER — Encounter: Payer: Self-pay | Admitting: Internal Medicine

## 2018-10-27 DIAGNOSIS — E039 Hypothyroidism, unspecified: Secondary | ICD-10-CM | POA: Diagnosis not present

## 2018-10-27 LAB — T4, FREE: Free T4: 1.14 ng/dL (ref 0.60–1.60)

## 2018-10-27 LAB — TSH: TSH: 4.1 u[IU]/mL (ref 0.35–4.50)

## 2018-11-02 ENCOUNTER — Encounter: Payer: Self-pay | Admitting: Internal Medicine

## 2018-11-02 ENCOUNTER — Ambulatory Visit
Admission: RE | Admit: 2018-11-02 | Discharge: 2018-11-02 | Disposition: A | Discharge status: Home / Self Care | Payer: Medicare Other | Source: Ambulatory Visit | Attending: Internal Medicine | Admitting: Internal Medicine

## 2018-11-02 ENCOUNTER — Other Ambulatory Visit: Payer: Self-pay

## 2018-11-02 DIAGNOSIS — M7989 Other specified soft tissue disorders: Secondary | ICD-10-CM | POA: Diagnosis not present

## 2018-11-02 DIAGNOSIS — G3281 Cerebellar ataxia in diseases classified elsewhere: Secondary | ICD-10-CM

## 2018-11-02 DIAGNOSIS — R2242 Localized swelling, mass and lump, left lower limb: Secondary | ICD-10-CM

## 2018-11-02 DIAGNOSIS — R413 Other amnesia: Secondary | ICD-10-CM | POA: Diagnosis not present

## 2018-11-02 MED ORDER — GADOBENATE DIMEGLUMINE 529 MG/ML IV SOLN
18.0000 mL | Freq: Once | INTRAVENOUS | Status: AC | PRN
  Administered 2018-11-02: 16:00:00 18 mL via INTRAVENOUS

## 2018-11-09 ENCOUNTER — Ambulatory Visit: Payer: Medicare Other | Admitting: Podiatry

## 2018-11-09 DIAGNOSIS — H43391 Other vitreous opacities, right eye: Secondary | ICD-10-CM | POA: Diagnosis not present

## 2018-11-09 DIAGNOSIS — H353211 Exudative age-related macular degeneration, right eye, with active choroidal neovascularization: Secondary | ICD-10-CM | POA: Diagnosis not present

## 2018-11-24 ENCOUNTER — Other Ambulatory Visit: Payer: Self-pay

## 2018-11-24 ENCOUNTER — Ambulatory Visit (INDEPENDENT_AMBULATORY_CARE_PROVIDER_SITE_OTHER): Payer: Medicare Other | Admitting: Podiatry

## 2018-11-24 ENCOUNTER — Encounter: Payer: Self-pay | Admitting: Podiatry

## 2018-11-24 DIAGNOSIS — M79676 Pain in unspecified toe(s): Secondary | ICD-10-CM | POA: Diagnosis not present

## 2018-11-24 DIAGNOSIS — B351 Tinea unguium: Secondary | ICD-10-CM | POA: Diagnosis not present

## 2018-11-24 DIAGNOSIS — E1140 Type 2 diabetes mellitus with diabetic neuropathy, unspecified: Secondary | ICD-10-CM

## 2018-11-24 DIAGNOSIS — D689 Coagulation defect, unspecified: Secondary | ICD-10-CM

## 2018-11-26 NOTE — Progress Notes (Signed)
Subjective: 85 y.o. returns the office today for painful, elongated, thickened toenails which he cannot trim himself. Denies any redness or drainage around the nails.  He has been seen by Dr. Prudence Davidson for the last few years but the patient states that he was told due to his bleeding it is hard to cut the nails. He has tried to cut the left 1st and 2nd toes which resulted in bleeding. He states it has healed well.  Denies any acute changes since last appointment and no new complaints today. Denies any systemic complaints such as fevers, chills, nausea, vomiting.   On Plavix.   PCP: Biagio Borg, MD  Objective: AAO 3, NAD DP/PT pulses palpable, CRT less than 3 seconds Nails hypertrophic, dystrophic, elongated, brittle, discolored 10. There is tenderness overlying the nails 1-5 bilaterally. There is no surrounding erythema or drainage along the nail sites.  There is evidence of dried blood to the distal aspect the left second and first toes from where he cut himself.  There is no signs of infection identified today. No open lesions or pre-ulcerative lesions are identified. No other areas of tenderness bilateral lower extremities. No overlying edema, erythema, increased warmth. No pain with calf compression, swelling, warmth, erythema.  Assessment: Patient presents with symptomatic onychomycosis, on plavix  Plan: -Treatment options including alternatives, risks, complications were discussed -Nails sharply debrided 10 without complication/bleeding. -Advised him not to try to cut the toenails himself. -Discussed daily foot inspection. If there are any changes, to call the office immediately.  -Follow-up in 3 months or sooner if any problems are to arise. In the meantime, encouraged to call the office with any questions, concerns, changes symptoms.  Celesta Gentile, DPM

## 2018-12-01 ENCOUNTER — Telehealth (HOSPITAL_COMMUNITY): Payer: Self-pay

## 2018-12-01 NOTE — Telephone Encounter (Signed)

## 2018-12-02 ENCOUNTER — Ambulatory Visit (HOSPITAL_COMMUNITY): Payer: Medicare Other | Attending: Cardiology

## 2018-12-02 ENCOUNTER — Encounter: Payer: Self-pay | Admitting: Internal Medicine

## 2018-12-02 ENCOUNTER — Other Ambulatory Visit: Payer: Self-pay

## 2018-12-02 DIAGNOSIS — R2689 Other abnormalities of gait and mobility: Secondary | ICD-10-CM

## 2018-12-02 DIAGNOSIS — R55 Syncope and collapse: Secondary | ICD-10-CM | POA: Diagnosis not present

## 2018-12-02 DIAGNOSIS — R42 Dizziness and giddiness: Secondary | ICD-10-CM | POA: Diagnosis not present

## 2018-12-28 DIAGNOSIS — H353124 Nonexudative age-related macular degeneration, left eye, advanced atrophic with subfoveal involvement: Secondary | ICD-10-CM | POA: Diagnosis not present

## 2018-12-28 DIAGNOSIS — H353113 Nonexudative age-related macular degeneration, right eye, advanced atrophic without subfoveal involvement: Secondary | ICD-10-CM | POA: Diagnosis not present

## 2018-12-28 DIAGNOSIS — H353211 Exudative age-related macular degeneration, right eye, with active choroidal neovascularization: Secondary | ICD-10-CM | POA: Diagnosis not present

## 2018-12-30 ENCOUNTER — Telehealth: Payer: Self-pay | Admitting: *Deleted

## 2018-12-30 NOTE — Telephone Encounter (Signed)
I called pt- he c/o abd pain since the end of April when his Levothyroxine dose was increased and his pharmacist thinks it could be from this or his high vitamin b12 levels. Patient wants his TSH labs and other labs PCP recommends checked. Please advise.

## 2018-12-30 NOTE — Telephone Encounter (Signed)
No need since may 2020 thyroid tests were normal, and high B12 level would be extremely unlikely in the real world to cause actual discomfort

## 2018-12-30 NOTE — Telephone Encounter (Signed)
lvm for pt to call back.  

## 2019-02-15 ENCOUNTER — Telehealth: Payer: Self-pay | Admitting: Internal Medicine

## 2019-02-15 DIAGNOSIS — H353211 Exudative age-related macular degeneration, right eye, with active choroidal neovascularization: Secondary | ICD-10-CM | POA: Diagnosis not present

## 2019-02-15 DIAGNOSIS — H35372 Puckering of macula, left eye: Secondary | ICD-10-CM | POA: Diagnosis not present

## 2019-02-15 NOTE — Telephone Encounter (Signed)
Pt does not care about being billed. He states he will pay. He wants lab work put in for it to be drawn tomorrow.

## 2019-02-15 NOTE — Telephone Encounter (Signed)
No I decline due to office policy to not lose money and pt will not be billed

## 2019-02-15 NOTE — Telephone Encounter (Signed)
Copied from [place] [id number]. Topic: General - Other >> Feb 15, 2019  2:45 PM Leward Quan A wrote: Reason for CRM: Patient called to say that he is having some issues with burning in the esophagus. Patient also complains of see figures and was told by the Eye specialist that he need to see a Neurologist yo correct that problem. Patient would like for Dr Jenny Reichmann to order blood work  to check his B-12 and thyroid levels. Per patient he would like the orders for the blood test to be in so he can get it done before the appointment on 02/21/2019 so it can be discussed during his visit. Ph#  [phone]

## 2019-02-15 NOTE — Telephone Encounter (Signed)
Very sorry, but the office policy is we are unable to do labs for persons with traditional medicare (even if you have a supplement) as they will not pay

## 2019-02-16 ENCOUNTER — Other Ambulatory Visit: Payer: Medicare Other

## 2019-02-16 NOTE — Telephone Encounter (Signed)
Pt's wife has been informed but stated the pt has already left home to have labs drawn. I informed her that I will let pt knows once he arrives.

## 2019-02-21 ENCOUNTER — Other Ambulatory Visit: Payer: Self-pay

## 2019-02-21 ENCOUNTER — Encounter: Payer: Self-pay | Admitting: Internal Medicine

## 2019-02-21 ENCOUNTER — Ambulatory Visit (INDEPENDENT_AMBULATORY_CARE_PROVIDER_SITE_OTHER): Payer: Medicare Other | Admitting: Internal Medicine

## 2019-02-21 ENCOUNTER — Other Ambulatory Visit (INDEPENDENT_AMBULATORY_CARE_PROVIDER_SITE_OTHER): Payer: Medicare Other

## 2019-02-21 VITALS — BP 130/70 | HR 68 | Temp 98.7°F | Wt 181.4 lb

## 2019-02-21 DIAGNOSIS — R443 Hallucinations, unspecified: Secondary | ICD-10-CM | POA: Diagnosis not present

## 2019-02-21 DIAGNOSIS — E538 Deficiency of other specified B group vitamins: Secondary | ICD-10-CM

## 2019-02-21 DIAGNOSIS — E1142 Type 2 diabetes mellitus with diabetic polyneuropathy: Secondary | ICD-10-CM

## 2019-02-21 DIAGNOSIS — E039 Hypothyroidism, unspecified: Secondary | ICD-10-CM | POA: Diagnosis not present

## 2019-02-21 LAB — HEMOGLOBIN A1C: Hgb A1c MFr Bld: 6.6 % — ABNORMAL HIGH (ref 4.6–6.5)

## 2019-02-21 NOTE — Assessment & Plan Note (Addendum)
Mild, not bothersome or activity limiting, declines tx or psychiatric referral  Note:  Total time for pt hx, exam, review of record with pt in the room, determination of diagnoses and plan for further eval and tx is > 40 min, with over 50% spent in coordination and counseling of patient including the differential dx, tx, further evaluation and other management of hallucination, hypothyroidism, DM , b12 deficiency

## 2019-02-21 NOTE — Progress Notes (Signed)
Subjective:    Patient ID: Stephen Wilkinson, male    DOB: 07/06/1928, 85 y.o.   MRN: [id number]  HPI  Here to f/u; overall doing ok,  Pt denies chest pain, increasing sob or doe, wheezing, orthopnea, PND, increased LE swelling, palpitations, dizziness or syncope.  Pt denies new neurological symptoms such as new headache, or facial or extremity weakness or numbness.  Pt denies polydipsia, polyuria, or low sugar episode.  Pt states overall good compliance with meds, mostly trying to follow appropriate diet, with wt overall stable,  but little exercise however. States esophagus burns and losing food taste on the levothyroxin 125, wondering if related.  Denies worsening reflux, abd pain, dysphagia, n/v, bowel change or blood.   Saw optho for macular degneration doing better last wk, but mentioned he was seeing "blue flowers" and admits some visual hallucinations but does not bother him enough to want tx.  Denies hyper or hypo thyroid symptoms such as voice, skin or hair change. Due for b12 stho Past Medical History:  Diagnosis Date  . Acute blood loss anemia 05/15/2011   post surgical  . Altered mental status 05/15/2011  . Anxiety   . B12 deficiency 09/23/2016  . Blood transfusion without reported diagnosis   . Cancer ([place])    basal and squamous skin carcinoma   . Cataract   . Chronic kidney disease    hx of uti due to catheter hx of prostatitis, BPH -   . Depression   . Diabetes mellitus   . GERD (gastroesophageal reflux disease)   . Glaucoma 01/04/2013  . Heart murmur   . History of nuclear stress test    Myoview 3/17: EF 62%, no ischemia or scar, low risk  . Hx of transient ischemic attack (TIA) 01/04/2013  . Hx: UTI (urinary tract infection)   . Hyperlipidemia   . Hypertension   . Hypokalemia 05/15/2011  . Hypothyroidism   . Insomnia   . Macular degeneration   . Neuropathy   . OA (osteoarthritis)   . Pneumonia   . PVC's (premature ventricular contractions)   . Renal mass   . Stroke ([place])     mid 1990s    Past Surgical History:  Procedure Laterality Date  . APPENDECTOMY    . CATARACT EXTRACTION W/ INTRAOCULAR LENS  IMPLANT, BILATERAL    . INGUINAL HERNIA REPAIR     X3  . LUMBAR LAMINECTOMY/DECOMPRESSION MICRODISCECTOMY N/A 02/12/2017   Procedure: Lumbar Three-Four, Lumbar Four-Five, Lumbar Five-Sacral One Laminectomy and Foraminotomy;  Surgeon: Earnie Larsson, MD;  Location: [facility];  Service: Neurosurgery;  Laterality: N/A;  . OTHER SURGICAL HISTORY     microwave procedure for prostate - 09/12   . PROSTATE ABLATION     microwave  . REPLACEMENT TOTAL KNEE    . THYROIDECTOMY    . TONSILLECTOMY AND ADENOIDECTOMY    . TOTAL KNEE ARTHROPLASTY  05/11/2011   Procedure: TOTAL KNEE ARTHROPLASTY;  Surgeon: Gearlean Alf;  Location: [facility];  Service: Orthopedics;  Laterality: Right;  . US ECHOCARDIOGRAPHY  11/10/2006   EF 55-60%  . US ECHOCARDIOGRAPHY  09/20/2002   EF 65-70%    reports that he has never smoked. He has never used smokeless tobacco. He reports current alcohol use of about 7.0 standard drinks of alcohol per week. He reports that he does not use drugs. family history includes Arthritis in his mother; Diabetes in his brother and mother; Hypertension in his brother, father, and mother; Kidney disease in his father; Stroke  in his father. No Known Allergies Current Outpatient Medications on File Prior to Visit  Medication Sig Dispense Refill  . ALPRAZolam (XANAX) 0.25 MG tablet Take 1 tablet (0.25 mg total) by mouth 2 (two) times daily. 180 tablet 1  . amLODipine (NORVASC) 10 MG tablet Take 1 tablet (10 mg total) by mouth every morning. 90 tablet 3  . aspirin 81 MG tablet Take 81 mg by mouth daily.    . Bevacizumab (AVASTIN IV) Place 1 drop into the right eye as directed. Every 6-8 weeks    . citalopram (CELEXA) 10 MG tablet Take 1 tablet (10 mg total) by mouth daily. 90 tablet 3  . clobetasol cream (TEMOVATE) [id number] % Apply 1 application topically daily as needed (for contact  dermatitis). Contact dermatitis  3  . clopidogrel (PLAVIX) 75 MG tablet Take 1 tablet (75 mg total) by mouth daily. 90 tablet 3  . famotidine (PEPCID) 20 MG tablet Take 1 tablet (20 mg total) by mouth 2 (two) times daily. 180 tablet 3  . glucose blood (ONE TOUCH ULTRA TEST) test strip Use as directed once daily to check blood sugar.  Diagnosis code E11.42 100 each 11  . latanoprost (XALATAN) 0.005 % ophthalmic solution Place 1 drop into both eyes at bedtime.     Marland Kitchen levothyroxine (SYNTHROID) 125 MCG tablet Take 1 tablet (125 mcg total) by mouth daily before breakfast. 90 tablet 3  . metFORMIN (GLUCOPHAGE) 1000 MG tablet Take 1 tablet (1,000 mg total) by mouth daily. 90 tablet 3  . metoprolol succinate (TOPROL-XL) 25 MG 24 hr tablet Take 1 tablet (25 mg total) by mouth every morning. 90 tablet 3  . Multiple Vitamins-Minerals (ICAPS AREDS 2 PO) Take 2 capsules by mouth daily.    Marland Kitchen tiZANidine (ZANAFLEX) 4 MG tablet Take 1 tablet (4 mg total) by mouth every 6 (six) hours as needed for muscle spasms. 600 tablet 1   No current facility-administered medications on file prior to visit.    Review of Systems  Constitutional: Negative for other unusual diaphoresis or sweats HENT: Negative for ear discharge or swelling Eyes: Negative for other worsening visual disturbances Respiratory: Negative for stridor or other swelling  Gastrointestinal: Negative for worsening distension or other blood Genitourinary: Negative for retention or other urinary change Musculoskeletal: Negative for other MSK pain or swelling Skin: Negative for color change or other new lesions Neurological: Negative for worsening tremors and other numbness  Psychiatric/Behavioral: Negative for worsening agitation or other fatigue ALl other system neg per pt    Objective:   Physical Exam BP 130/70 (BP Location: Left Arm)   Pulse 68   Temp 98.7 F (37.1 C) (Oral)   Wt 181 lb 6.4 oz (82.3 kg)   SpO2 98%   BMI 23.61 kg/m  VS noted,   Constitutional: Pt appears in NAD HENT: Head: NCAT.  Right Ear: External ear normal.  Left Ear: External ear normal.  Eyes: . Pupils are equal, round, and reactive to light. Conjunctivae and EOM are normal Nose: without d/c or deformity Neck: Neck supple. Gross normal ROM Cardiovascular: Normal rate and regular rhythm.   Pulmonary/Chest: Effort normal and breath sounds without rales or wheezing.  Abd:  Soft, NT, ND, + BS, no organomegaly Neurological: Pt is alert. At baseline orientation, motor grossly intact Skin: Skin is warm. No rashes, other new lesions, no LE edema Psychiatric: Pt behavior is normal without agitation  No other exam findings  Lab Results  Component Value Date   WBC 6.3  09/30/2018   HGB 16.0 09/30/2018   HCT 45.8 09/30/2018   PLT 236.0 09/30/2018   GLUCOSE 124 (H) 09/30/2018   CHOL 215 (H) 09/30/2018   TRIG 140.0 09/30/2018   HDL 70.40 09/30/2018   LDLCALC 117 (H) 09/30/2018   ALT 15 09/30/2018   AST 20 09/30/2018   NA 142 09/30/2018   K 4.6 09/30/2018   CL 103 09/30/2018   CREATININE 1.36 09/30/2018   BUN 15 09/30/2018   CO2 27 09/30/2018   TSH 4.10 10/27/2018   INR 2.16 (H) 05/15/2011   HGBA1C 6.9 (H) 09/30/2018   MICROALBUR 0.7 09/17/2017      Assessment & Plan:

## 2019-02-21 NOTE — Assessment & Plan Note (Signed)
stable overall by history and exam, recent data reviewed with pt, and pt to continue medical treatment as before,  to f/u any worsening symptoms or concerns  

## 2019-02-21 NOTE — Patient Instructions (Signed)

## 2019-02-21 NOTE — Assessment & Plan Note (Signed)
For f/u b12 level,  to f/u any worsening symptoms or concerns 

## 2019-02-22 ENCOUNTER — Encounter: Payer: Self-pay | Admitting: Internal Medicine

## 2019-02-22 LAB — BASIC METABOLIC PANEL
BUN: 20 mg/dL (ref 6–23)
CO2: 25 mEq/L (ref 19–32)
Calcium: 9.7 mg/dL (ref 8.4–10.5)
Chloride: 102 mEq/L (ref 96–112)
Creatinine, Ser: 1.16 mg/dL (ref 0.40–1.50)
GFR: 59.34 mL/min — ABNORMAL LOW (ref >60.00)
Glucose, Bld: 116 mg/dL — ABNORMAL HIGH (ref 70–99)
Potassium: 4.5 mEq/L (ref 3.5–5.1)
Sodium: 139 mEq/L (ref 135–145)

## 2019-02-22 LAB — T4, FREE: Free T4: 1.21 ng/dL (ref 0.60–1.60)

## 2019-02-22 LAB — VITAMIN B12: Vitamin B-12: 390 pg/mL (ref 211–911)

## 2019-02-22 LAB — TSH: TSH: 1.55 u[IU]/mL (ref 0.35–4.50)

## 2019-02-24 ENCOUNTER — Encounter: Payer: Self-pay | Admitting: Podiatry

## 2019-02-24 ENCOUNTER — Other Ambulatory Visit: Payer: Self-pay

## 2019-02-24 ENCOUNTER — Ambulatory Visit (INDEPENDENT_AMBULATORY_CARE_PROVIDER_SITE_OTHER): Payer: Medicare Other | Admitting: Podiatry

## 2019-02-24 DIAGNOSIS — E1140 Type 2 diabetes mellitus with diabetic neuropathy, unspecified: Secondary | ICD-10-CM | POA: Diagnosis not present

## 2019-02-24 DIAGNOSIS — L84 Corns and callosities: Secondary | ICD-10-CM | POA: Diagnosis not present

## 2019-02-24 DIAGNOSIS — Z7901 Long term (current) use of anticoagulants: Secondary | ICD-10-CM | POA: Diagnosis not present

## 2019-02-24 DIAGNOSIS — M79676 Pain in unspecified toe(s): Secondary | ICD-10-CM | POA: Diagnosis not present

## 2019-02-24 DIAGNOSIS — B351 Tinea unguium: Secondary | ICD-10-CM

## 2019-02-27 NOTE — Progress Notes (Signed)
Subjective: 85 y.o. returns the office today for painful, elongated, thickened toenails which he cannot trim himself as well as for callus on the right foot. Denies any redness or drainage around the nails/callus.  Denies any acute changes since last appointment and no new complaints today. Denies any systemic complaints such as fevers, chills, nausea, vomiting.   On Plavix.   PCP: Biagio Borg, MD  Objective: AAO 3, NAD DP/PT pulses palpable, CRT less than 3 seconds Nails hypertrophic, dystrophic, elongated, brittle, discolored 10. There is tenderness overlying the nails 1-5 bilaterally. There is no surrounding erythema or drainage along the nail sites.  Hyperkeratotic pre-ulcerative callus on the right foot submetatarsal.  Upon debridement no ongoing ulceration, drainage or any signs of infection No other areas of tenderness bilateral lower extremities. No overlying edema, erythema, increased warmth. No pain with calf compression, swelling, warmth, erythema.  Assessment: Patient presents with symptomatic onychomycosis, hyperkeratotic lesion on plavix  Plan: -Treatment options including alternatives, risks, complications were discussed -Nails sharply debrided 10 without complication/bleeding. -Hyperkeratotic lesion sharply debrided x1 without any complications or bleeding -Advised him not to try to cut the toenails himself. -Discussed daily foot inspection. If there are any changes, to call the office immediately.  -Follow-up in 3 months or sooner if any problems are to arise. In the meantime, encouraged to call the office with any questions, concerns, changes symptoms.  Celesta Gentile, DPM

## 2019-03-01 DIAGNOSIS — Z961 Presence of intraocular lens: Secondary | ICD-10-CM | POA: Diagnosis not present

## 2019-03-01 DIAGNOSIS — E119 Type 2 diabetes mellitus without complications: Secondary | ICD-10-CM | POA: Diagnosis not present

## 2019-03-01 DIAGNOSIS — H52201 Unspecified astigmatism, right eye: Secondary | ICD-10-CM | POA: Diagnosis not present

## 2019-03-01 LAB — HM DIABETES EYE EXAM

## 2019-03-06 ENCOUNTER — Encounter: Payer: Self-pay | Admitting: Internal Medicine

## 2019-04-04 ENCOUNTER — Encounter: Payer: Self-pay | Admitting: Internal Medicine

## 2019-04-04 ENCOUNTER — Other Ambulatory Visit: Payer: Self-pay

## 2019-04-04 ENCOUNTER — Ambulatory Visit (INDEPENDENT_AMBULATORY_CARE_PROVIDER_SITE_OTHER): Payer: Medicare Other | Admitting: Internal Medicine

## 2019-04-04 VITALS — BP 138/78 | HR 65 | Temp 98.6°F | Ht 73.5 in | Wt 185.0 lb

## 2019-04-04 DIAGNOSIS — F419 Anxiety disorder, unspecified: Secondary | ICD-10-CM

## 2019-04-04 DIAGNOSIS — E7800 Pure hypercholesterolemia, unspecified: Secondary | ICD-10-CM

## 2019-04-04 DIAGNOSIS — Z23 Encounter for immunization: Secondary | ICD-10-CM | POA: Diagnosis not present

## 2019-04-04 DIAGNOSIS — E1142 Type 2 diabetes mellitus with diabetic polyneuropathy: Secondary | ICD-10-CM

## 2019-04-04 DIAGNOSIS — E034 Atrophy of thyroid (acquired): Secondary | ICD-10-CM

## 2019-04-04 DIAGNOSIS — F329 Major depressive disorder, single episode, unspecified: Secondary | ICD-10-CM | POA: Diagnosis not present

## 2019-04-04 DIAGNOSIS — F32A Depression, unspecified: Secondary | ICD-10-CM

## 2019-04-04 MED ORDER — FLUOXETINE HCL 10 MG PO CAPS: 10.0000 mg | ORAL_CAPSULE | Freq: Every day | ORAL | 3 refills | Status: DC

## 2019-04-04 MED ORDER — FAMOTIDINE 20 MG PO TABS: 20.0000 mg | ORAL_TABLET | Freq: Two times a day (BID) | ORAL | 3 refills | Status: DC

## 2019-04-04 MED ORDER — TIZANIDINE HCL 4 MG PO TABS: 4.0000 mg | ORAL_TABLET | Freq: Four times a day (QID) | ORAL | 1 refills | Status: DC | PRN

## 2019-04-04 MED ORDER — LEVOTHYROXINE SODIUM 125 MCG PO TABS: 125.0000 ug | ORAL_TABLET | Freq: Every day | ORAL | 3 refills | Status: DC

## 2019-04-04 MED ORDER — GLUCOSE BLOOD VI STRP: ORAL_STRIP | 11 refills | Status: DC

## 2019-04-04 MED ORDER — METFORMIN HCL 1000 MG PO TABS: 1000.0000 mg | ORAL_TABLET | Freq: Every day | ORAL | 3 refills | Status: DC

## 2019-04-04 NOTE — Progress Notes (Signed)
Subjective:    Patient ID: Stephen Wilkinson, male    DOB: March 11, 1929, 85 y.o.   MRN: [id number]  HPI  Here to f/u; overall doing ok,  Pt denies chest pain, increasing sob or doe, wheezing, orthopnea, PND, increased LE swelling, palpitations, dizziness or syncope.  Pt denies new neurological symptoms such as new headache, or facial or extremity weakness or numbness.  Pt denies polydipsia, polyuria, or low sugar episode.  Pt states overall good compliance with meds, mostly trying to follow appropriate diet BP Readings from Last 3 Encounters:  04/04/19 138/78  02/21/19 130/70  09/30/18 (!) 144/96   Wt Readings from Last 3 Encounters:  04/04/19 185 lb (83.9 kg)  02/21/19 181 lb 6.4 oz (82.3 kg)  09/30/18 195 lb (88.5 kg)  Also has ongoing severe anxiety and worry, realzies this but cant help it.  Had been on xanax to last yr but got off over concerns of dependence,  Tried celexa but "intesified the burning sensation" and thinks it "magnifies the thyroid medication."    Denies hyper or hypo thyroid symptoms such as voice, skin or hair change.  Willing to try low dose prozac. Denies hyper or hypo thyroid symptoms such as voice, skin or hair change.  Also falling more recently he believes due to poor eyesight due to macular degeneration and being frequently off balance with walking, no injuries.  Does use cane occasionally. Tries to walk in the neighborhood but not much as it is is embarrassing to have to be picked up from falling.   Past Medical History:  Diagnosis Date  . Acute blood loss anemia 05/15/2011   post surgical  . Altered mental status 05/15/2011  . Anxiety   . B12 deficiency 09/23/2016  . Blood transfusion without reported diagnosis   . Cancer ([place])    basal and squamous skin carcinoma   . Cataract   . Chronic kidney disease    hx of uti due to catheter hx of prostatitis, BPH -   . Depression   . Diabetes mellitus   . GERD (gastroesophageal reflux disease)   . Glaucoma 01/04/2013  .  Heart murmur   . History of nuclear stress test    Myoview 3/17: EF 62%, no ischemia or scar, low risk  . Hx of transient ischemic attack (TIA) 01/04/2013  . Hx: UTI (urinary tract infection)   . Hyperlipidemia   . Hypertension   . Hypokalemia 05/15/2011  . Hypothyroidism   . Insomnia   . Macular degeneration   . Neuropathy   . OA (osteoarthritis)   . Pneumonia   . PVC's (premature ventricular contractions)   . Renal mass   . Stroke ([place])    mid 1990s    Past Surgical History:  Procedure Laterality Date  . APPENDECTOMY    . CATARACT EXTRACTION W/ INTRAOCULAR LENS  IMPLANT, BILATERAL    . INGUINAL HERNIA REPAIR     X3  . LUMBAR LAMINECTOMY/DECOMPRESSION MICRODISCECTOMY N/A 02/12/2017   Procedure: Lumbar Three-Four, Lumbar Four-Five, Lumbar Five-Sacral One Laminectomy and Foraminotomy;  Surgeon: Earnie Larsson, MD;  Location: [facility];  Service: Neurosurgery;  Laterality: N/A;  . OTHER SURGICAL HISTORY     microwave procedure for prostate - 09/12   . PROSTATE ABLATION     microwave  . REPLACEMENT TOTAL KNEE    . THYROIDECTOMY    . TONSILLECTOMY AND ADENOIDECTOMY    . TOTAL KNEE ARTHROPLASTY  05/11/2011   Procedure: TOTAL KNEE ARTHROPLASTY;  Surgeon: Gearlean Alf;  Location: [facility];  Service: Orthopedics;  Laterality: Right;  . US ECHOCARDIOGRAPHY  11/10/2006   EF 55-60%  . US ECHOCARDIOGRAPHY  09/20/2002   EF 65-70%    reports that he has never smoked. He has never used smokeless tobacco. He reports current alcohol use of about 7.0 standard drinks of alcohol per week. He reports that he does not use drugs. family history includes Arthritis in his mother; Diabetes in his brother and mother; Hypertension in his brother, father, and mother; Kidney disease in his father; Stroke in his father. No Known Allergies Current Outpatient Medications on File Prior to Visit  Medication Sig Dispense Refill  . ALPRAZolam (XANAX) 0.25 MG tablet Take 1 tablet (0.25 mg total) by mouth 2 (two) times  daily. 180 tablet 1  . amLODipine (NORVASC) 10 MG tablet Take 1 tablet (10 mg total) by mouth every morning. 90 tablet 3  . aspirin 81 MG tablet Take 81 mg by mouth daily.    . Bevacizumab (AVASTIN IV) Place 1 drop into the right eye as directed. Every 6-8 weeks    . citalopram (CELEXA) 10 MG tablet Take 1 tablet (10 mg total) by mouth daily. 90 tablet 3  . clobetasol cream (TEMOVATE) [id number] % Apply 1 application topically daily as needed (for contact dermatitis). Contact dermatitis  3  . clopidogrel (PLAVIX) 75 MG tablet Take 1 tablet (75 mg total) by mouth daily. 90 tablet 3  . latanoprost (XALATAN) 0.005 % ophthalmic solution Place 1 drop into both eyes at bedtime.     . metoprolol succinate (TOPROL-XL) 25 MG 24 hr tablet Take 1 tablet (25 mg total) by mouth every morning. 90 tablet 3  . Multiple Vitamins-Minerals (ICAPS AREDS 2 PO) Take 2 capsules by mouth daily.     No current facility-administered medications on file prior to visit.    Review of Systems  Constitutional: Negative for other unusual diaphoresis or sweats HENT: Negative for ear discharge or swelling Eyes: Negative for other worsening visual disturbances Respiratory: Negative for stridor or other swelling  Gastrointestinal: Negative for worsening distension or other blood Genitourinary: Negative for retention or other urinary change Musculoskeletal: Negative for other MSK pain or swelling Skin: Negative for color change or other new lesions Neurological: Negative for worsening tremors and other numbness  Psychiatric/Behavioral: Negative for worsening agitation or other fatigue All otherwise neg per pt    Objective:   Physical Exam BP 138/78 (BP Location: Left Arm, Patient Position: Sitting, Cuff Size: Normal)   Pulse 65   Temp 98.6 F (37 C) (Oral)   Ht 6' 1.5" (1.867 m)   Wt 185 lb (83.9 kg)   SpO2 96%   BMI 24.08 kg/m  VS noted,  Constitutional: Pt appears in NAD HENT: Head: NCAT.  Right Ear: External ear  normal.  Left Ear: External ear normal.  Eyes: . Pupils are equal, round, and reactive to light. Conjunctivae and EOM are normal Nose: without d/c or deformity Neck: Neck supple. Gross normal ROM Cardiovascular: Normal rate and regular rhythm.   Pulmonary/Chest: Effort normal and breath sounds without rales or wheezing.  Abd:  Soft, NT, ND, + BS, no organomegaly Neurological: Pt is alert. At baseline orientation, motor grossly intact Skin: Skin is warm. No rashes, other new lesions, no LE edema Psychiatric: Pt behavior is normal without agitation , 1-2+ nervous All otherwise neg per pt  Lab Results  Component Value Date   WBC 6.3 09/30/2018   HGB 16.0 09/30/2018   HCT 45.8  09/30/2018   PLT 236.0 09/30/2018   GLUCOSE 116 (H) 02/21/2019   CHOL 215 (H) 09/30/2018   TRIG 140.0 09/30/2018   HDL 70.40 09/30/2018   LDLCALC 117 (H) 09/30/2018   ALT 15 09/30/2018   AST 20 09/30/2018   NA 139 02/21/2019   K 4.5 02/21/2019   CL 102 02/21/2019   CREATININE 1.16 02/21/2019   BUN 20 02/21/2019   CO2 25 02/21/2019   TSH 1.55 02/21/2019   INR 2.16 (H) 05/15/2011   HGBA1C 6.6 (H) 02/21/2019   MICROALBUR 0.7 09/17/2017       Assessment & Plan:

## 2019-04-04 NOTE — Patient Instructions (Addendum)
You had the flu shot today  Please take all new medication as prescribed - the generic prozac 10 mg per day  Please continue all other medications as before, including restarting the Pepcid (famotidine) to help with the burning reflux  Please have the pharmacy call with any other refills you may need.  Please continue your efforts at being more active, low cholesterol diet, and weight control.  Please keep your appointments with your specialists as you may have planned  Please return in 4 months, or sooner if needed

## 2019-04-05 DIAGNOSIS — H353124 Nonexudative age-related macular degeneration, left eye, advanced atrophic with subfoveal involvement: Secondary | ICD-10-CM | POA: Diagnosis not present

## 2019-04-05 DIAGNOSIS — H35051 Retinal neovascularization, unspecified, right eye: Secondary | ICD-10-CM | POA: Diagnosis not present

## 2019-04-05 DIAGNOSIS — H353211 Exudative age-related macular degeneration, right eye, with active choroidal neovascularization: Secondary | ICD-10-CM | POA: Diagnosis not present

## 2019-04-07 NOTE — Progress Notes (Signed)
HPI: Follow-up hypertension and palpitations. Also history of TIA. Echo 2008 showed normal LV function; grade 1 DD; mild LVH; trace AI; mild MR and TR. Nuclear study March 2017 showed ejection fraction 62% and no ischemia.  Carotid Dopplers May 2020 showed 1 to 39% bilateral stenosis.  Echocardiogram June 2020 showed normal LV function, trace aortic insufficiency and mild aortic stenosis with mean gradient 9 mmHg.  Since last seen,there is no dyspnea, chest pain, palpitations or syncope.  Current Outpatient Medications  Medication Sig Dispense Refill  . amLODipine (NORVASC) 10 MG tablet Take 1 tablet (10 mg total) by mouth every morning. 90 tablet 3  . aspirin 81 MG tablet Take 81 mg by mouth daily.    . Bevacizumab (AVASTIN IV) Place 1 drop into the right eye as directed. Every 6-8 weeks    . clobetasol cream (TEMOVATE) [id number] % Apply 1 application topically daily as needed (for contact dermatitis). Contact dermatitis  3  . clopidogrel (PLAVIX) 75 MG tablet Take 1 tablet (75 mg total) by mouth daily. 90 tablet 3  . famotidine (PEPCID) 20 MG tablet Take 1 tablet (20 mg total) by mouth 2 (two) times daily. 180 tablet 3  . FLUoxetine (PROZAC) 10 MG capsule Take 1 capsule (10 mg total) by mouth daily. 90 capsule 3  . glucose blood (ONE TOUCH ULTRA TEST) test strip Use as directed once daily to check blood sugar.  Diagnosis code E11.42 100 each 11  . latanoprost (XALATAN) 0.005 % ophthalmic solution Place 1 drop into both eyes at bedtime.     Marland Kitchen levothyroxine (SYNTHROID) 125 MCG tablet Take 1 tablet (125 mcg total) by mouth daily before breakfast. 90 tablet 3  . metFORMIN (GLUCOPHAGE) 1000 MG tablet Take 1 tablet (1,000 mg total) by mouth daily. 90 tablet 3  . metoprolol succinate (TOPROL-XL) 25 MG 24 hr tablet Take 1 tablet (25 mg total) by mouth every morning. 90 tablet 3  . Multiple Vitamins-Minerals (ICAPS AREDS 2 PO) Take 2 capsules by mouth daily.    Marland Kitchen tiZANidine (ZANAFLEX) 4 MG tablet  Take 1 tablet (4 mg total) by mouth every 6 (six) hours as needed for muscle spasms. 600 tablet 1   No current facility-administered medications for this visit.      Past Medical History:  Diagnosis Date  . Acute blood loss anemia 05/15/2011   post surgical  . Altered mental status 05/15/2011  . Anxiety   . B12 deficiency 09/23/2016  . Blood transfusion without reported diagnosis   . Cancer ([place])    basal and squamous skin carcinoma   . Cataract   . Chronic kidney disease    hx of uti due to catheter hx of prostatitis, BPH -   . Depression   . Diabetes mellitus   . GERD (gastroesophageal reflux disease)   . Glaucoma 01/04/2013  . Heart murmur   . History of nuclear stress test    Myoview 3/17: EF 62%, no ischemia or scar, low risk  . Hx of transient ischemic attack (TIA) 01/04/2013  . Hx: UTI (urinary tract infection)   . Hyperlipidemia   . Hypertension   . Hypokalemia 05/15/2011  . Hypothyroidism   . Insomnia   . Macular degeneration   . Neuropathy   . OA (osteoarthritis)   . Pneumonia   . PVC's (premature ventricular contractions)   . Renal mass   . Stroke ([place])    mid 1990s     Past Surgical History:  Procedure  Laterality Date  . APPENDECTOMY    . CATARACT EXTRACTION W/ INTRAOCULAR LENS  IMPLANT, BILATERAL    . INGUINAL HERNIA REPAIR     X3  . LUMBAR LAMINECTOMY/DECOMPRESSION MICRODISCECTOMY N/A 02/12/2017   Procedure: Lumbar Three-Four, Lumbar Four-Five, Lumbar Five-Sacral One Laminectomy and Foraminotomy;  Surgeon: Earnie Larsson, MD;  Location: [facility];  Service: Neurosurgery;  Laterality: N/A;  . OTHER SURGICAL HISTORY     microwave procedure for prostate - 09/12   . PROSTATE ABLATION     microwave  . REPLACEMENT TOTAL KNEE    . THYROIDECTOMY    . TONSILLECTOMY AND ADENOIDECTOMY    . TOTAL KNEE ARTHROPLASTY  05/11/2011   Procedure: TOTAL KNEE ARTHROPLASTY;  Surgeon: Gearlean Alf;  Location: [facility];  Service: Orthopedics;  Laterality: Right;  . US  ECHOCARDIOGRAPHY  11/10/2006   EF 55-60%  . US ECHOCARDIOGRAPHY  09/20/2002   EF 65-70%    Social History   Socioeconomic History  . Marital status: Married    Spouse name: Not on file  . Number of children: 3  . Years of education: Not on file  . Highest education level: Not on file  Occupational History  . Occupation: retired    Fish farm manager: RETIRED    Comment: [place] [employer]  Social Needs  . Financial resource strain: Not on file  . Food insecurity    Worry: Not on file    Inability: Not on file  . Transportation needs    Medical: Not on file    Non-medical: Not on file  Tobacco Use  . Smoking status: Never Smoker  . Smokeless tobacco: Never Used  Substance and Sexual Activity  . Alcohol use: Yes    Alcohol/week: 7.0 standard drinks    Types: 7 Shots of liquor per week    Comment: 2 drinks most days  . Drug use: No  . Sexual activity: Never    Birth control/protection: None  Lifestyle  . Physical activity    Days per week: Not on file    Minutes per session: Not on file  . Stress: Not on file  Relationships  . Social Herbalist on phone: Not on file    Gets together: Not on file    Attends religious service: Not on file    Active member of club or organization: Not on file    Attends meetings of clubs or organizations: Not on file    Relationship status: Not on file  . Intimate partner violence    Fear of current or ex partner: Not on file    Emotionally abused: Not on file    Physically abused: Not on file    Forced sexual activity: Not on file  Other Topics Concern  . 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 Hypertension Mother   . Arthritis Mother   . Diabetes Mother   . Hypertension  Father   . Stroke Father   . Kidney disease Father   . Hypertension Brother   . Diabetes Brother     ROS: Fatigue and decreased memory but no fevers or  chills, productive cough, hemoptysis, dysphasia, odynophagia, melena, hematochezia, dysuria, hematuria, rash, seizure activity, orthopnea, PND, pedal edema, claudication. Remaining systems are negative.  Physical Exam: Well-developed well-nourished in no acute distress.  Skin is warm and dry.  HEENT is normal.  Neck is supple.  Chest is clear to auscultation with normal expansion.  Cardiovascular exam is regular rate and rhythm.  2/6 systolic murmur left sternal border. Abdominal exam nontender or distended. No masses palpated. Extremities show no edema. neuro grossly intact  A/P  1 hypertension-patient's blood pressure is controlled.  Continue present medications and follow-up.  2 mild aortic stenosis-he will need follow-up echoes in the future.  3 prior TIA-continue aspirin and Plavix.  4 hyperlipidemia-followed by primary care.  5 palpitations-continue present dose of beta-blocker.  Symptoms reasonably well controlled.  Kirk Ruths, MD

## 2019-04-09 ENCOUNTER — Encounter: Payer: Self-pay | Admitting: Internal Medicine

## 2019-04-09 NOTE — Assessment & Plan Note (Signed)
stable overall by history and exam, recent data reviewed with pt, and pt to continue medical treatment as before,  to f/u any worsening symptoms or concerns  

## 2019-04-09 NOTE — Assessment & Plan Note (Signed)
Ok for prozac 10 qd,  to f/u any worsening symptoms or concerns

## 2019-04-11 ENCOUNTER — Other Ambulatory Visit: Payer: Self-pay

## 2019-04-11 ENCOUNTER — Encounter: Payer: Self-pay | Admitting: Cardiology

## 2019-04-11 ENCOUNTER — Ambulatory Visit (INDEPENDENT_AMBULATORY_CARE_PROVIDER_SITE_OTHER): Payer: Medicare Other | Admitting: Cardiology

## 2019-04-11 VITALS — BP 128/66 | HR 68 | Temp 97.9°F | Ht 73.0 in | Wt 184.0 lb

## 2019-04-11 DIAGNOSIS — R002 Palpitations: Secondary | ICD-10-CM | POA: Diagnosis not present

## 2019-04-11 DIAGNOSIS — I350 Nonrheumatic aortic (valve) stenosis: Secondary | ICD-10-CM

## 2019-04-11 DIAGNOSIS — I119 Hypertensive heart disease without heart failure: Secondary | ICD-10-CM | POA: Diagnosis not present

## 2019-04-11 MED ORDER — AMLODIPINE BESYLATE 10 MG PO TABS: 10.0000 mg | ORAL_TABLET | ORAL | 3 refills | Status: DC

## 2019-04-11 MED ORDER — METOPROLOL SUCCINATE ER 25 MG PO TB24: 25.0000 mg | ORAL_TABLET | ORAL | 3 refills | Status: DC

## 2019-04-11 MED ORDER — CLOPIDOGREL BISULFATE 75 MG PO TABS: 75.0000 mg | ORAL_TABLET | Freq: Every day | ORAL | 3 refills | Status: DC

## 2019-04-11 NOTE — Patient Instructions (Signed)
Medication Instructions:  NO CHANGE *If you need a refill on your cardiac medications before your next appointment, please call your pharmacy*  Lab Work: If you have labs (blood work) drawn today and your tests are completely normal, you will receive your results only by: Marland Kitchen MyChart Message (if you have MyChart) OR . A paper copy in the mail If you have any lab test that is abnormal or we need to change your treatment, we will call you to review the results.  Follow-Up: At [facility], you and your health needs are our priority.  As part of our continuing mission to provide you with exceptional heart care, we have created designated Provider Care Teams.  These Care Teams include your primary Cardiologist (physician) and Advanced Practice Providers (APPs -  Physician Assistants and Nurse Practitioners) who all work together to provide you with the care you need, when you need it.  Your next appointment:   6 months  The format for your next appointment:   Either In Person or Virtual  Provider:   You may see No primary care provider on file. Kirk Ruths MD or one of the following Advanced Practice Providers on your designated Care Team:    Kerin Ransom, PA-C  [place], [place]  Coletta Memos, [place]

## 2019-05-09 ENCOUNTER — Other Ambulatory Visit: Payer: Self-pay

## 2019-05-24 DIAGNOSIS — H353124 Nonexudative age-related macular degeneration, left eye, advanced atrophic with subfoveal involvement: Secondary | ICD-10-CM | POA: Diagnosis not present

## 2019-05-24 DIAGNOSIS — H35372 Puckering of macula, left eye: Secondary | ICD-10-CM | POA: Diagnosis not present

## 2019-05-24 DIAGNOSIS — H353211 Exudative age-related macular degeneration, right eye, with active choroidal neovascularization: Secondary | ICD-10-CM | POA: Diagnosis not present

## 2019-05-24 DIAGNOSIS — H43391 Other vitreous opacities, right eye: Secondary | ICD-10-CM | POA: Diagnosis not present

## 2019-05-26 ENCOUNTER — Ambulatory Visit: Payer: Medicare Other | Admitting: Podiatry

## 2019-05-29 ENCOUNTER — Ambulatory Visit (INDEPENDENT_AMBULATORY_CARE_PROVIDER_SITE_OTHER): Payer: Medicare Other | Admitting: Podiatry

## 2019-05-29 ENCOUNTER — Other Ambulatory Visit: Payer: Self-pay

## 2019-05-29 DIAGNOSIS — M79676 Pain in unspecified toe(s): Secondary | ICD-10-CM | POA: Diagnosis not present

## 2019-05-29 DIAGNOSIS — E1140 Type 2 diabetes mellitus with diabetic neuropathy, unspecified: Secondary | ICD-10-CM

## 2019-05-29 DIAGNOSIS — B351 Tinea unguium: Secondary | ICD-10-CM | POA: Diagnosis not present

## 2019-05-29 DIAGNOSIS — L84 Corns and callosities: Secondary | ICD-10-CM

## 2019-05-29 DIAGNOSIS — D689 Coagulation defect, unspecified: Secondary | ICD-10-CM | POA: Diagnosis not present

## 2019-06-07 NOTE — Progress Notes (Signed)
Subjective: 85 y.o. returns the office today for painful, elongated, thickened toenails which he cannot trim himself as well as for callus on the right foot. Denies any redness or drainage around the nails/callus.  Denies any acute changes since last appointment and no new complaints today. Denies any systemic complaints such as fevers, chills, nausea, vomiting.   On Plavix.   PCP: Biagio Borg, MD  Objective: AAO 3, NAD DP/PT pulses palpable, CRT less than 3 seconds Nails hypertrophic, dystrophic, elongated, brittle, discolored 10. There is tenderness overlying the nails 1-5 bilaterally. There is no surrounding erythema or drainage along the nail sites.  Hyperkeratotic pre-ulcerative callus on the right foot submetatarsal.  Upon debridement no ongoing ulceration, drainage or any signs of infection No other areas of tenderness bilateral lower extremities. No overlying edema, erythema, increased warmth. No pain with calf compression, swelling, warmth, erythema.  Assessment: Patient presents with symptomatic onychomycosis, hyperkeratotic lesion on plavix  Plan: -Treatment options including alternatives, risks, complications were discussed -Nails sharply debrided 10 without complication/bleeding. -Hyperkeratotic lesion sharply debrided x1 without any complications or bleeding -Discussed daily foot inspection. If there are any changes, to call the office immediately.  -Follow-up in 3 months or sooner if any problems are to arise. In the meantime, encouraged to call the office with any questions, concerns, changes symptoms.  Celesta Gentile, DPM

## 2019-06-30 ENCOUNTER — Other Ambulatory Visit: Payer: Self-pay | Admitting: *Deleted

## 2019-06-30 MED ORDER — TIZANIDINE HCL 4 MG PO TABS: 4.0000 mg | ORAL_TABLET | Freq: Four times a day (QID) | ORAL | 0 refills | Status: DC | PRN

## 2019-07-11 ENCOUNTER — Telehealth: Payer: Self-pay

## 2019-07-11 NOTE — Telephone Encounter (Signed)
Faxed prior authorization for Tizanidine 4mg  to [facility].

## 2019-07-12 DIAGNOSIS — H35051 Retinal neovascularization, unspecified, right eye: Secondary | ICD-10-CM | POA: Diagnosis not present

## 2019-07-12 DIAGNOSIS — H353211 Exudative age-related macular degeneration, right eye, with active choroidal neovascularization: Secondary | ICD-10-CM | POA: Diagnosis not present

## 2019-07-12 DIAGNOSIS — H353113 Nonexudative age-related macular degeneration, right eye, advanced atrophic without subfoveal involvement: Secondary | ICD-10-CM | POA: Diagnosis not present

## 2019-07-12 DIAGNOSIS — H353124 Nonexudative age-related macular degeneration, left eye, advanced atrophic with subfoveal involvement: Secondary | ICD-10-CM | POA: Diagnosis not present

## 2019-07-26 NOTE — Telephone Encounter (Signed)
Spoke with Stephen Wilkinson, he stated [facility] had sent him letters saying the Tizanidine would be harmful to him if he continued to take but they ended up sending him 2 more prescriptions. He said he would discuss the papers to appeal with Dr. Jenny Reichmann at his next appointment.

## 2019-07-28 NOTE — Telephone Encounter (Signed)
Please see previous telephone encounter regarding PA for tizanidine.

## 2019-07-28 NOTE — Telephone Encounter (Signed)
Spoke with Timmothy Sours, he stated [facility] had sent him letters saying the Tizanidine would be harmful to him if he continued to take but they ended up sending him 2 more prescriptions. He said he would discuss the papers to appeal with Dr. Jenny Reichmann at his next appointment.

## 2019-08-28 ENCOUNTER — Other Ambulatory Visit: Payer: Self-pay

## 2019-08-28 ENCOUNTER — Ambulatory Visit (INDEPENDENT_AMBULATORY_CARE_PROVIDER_SITE_OTHER): Payer: Medicare Other | Admitting: Podiatry

## 2019-08-28 ENCOUNTER — Encounter: Payer: Self-pay | Admitting: Podiatry

## 2019-08-28 VITALS — Temp 97.2°F

## 2019-08-28 DIAGNOSIS — M79676 Pain in unspecified toe(s): Secondary | ICD-10-CM | POA: Diagnosis not present

## 2019-08-28 DIAGNOSIS — L84 Corns and callosities: Secondary | ICD-10-CM

## 2019-08-28 DIAGNOSIS — Z7901 Long term (current) use of anticoagulants: Secondary | ICD-10-CM | POA: Diagnosis not present

## 2019-08-28 DIAGNOSIS — B351 Tinea unguium: Secondary | ICD-10-CM

## 2019-08-28 DIAGNOSIS — E1140 Type 2 diabetes mellitus with diabetic neuropathy, unspecified: Secondary | ICD-10-CM

## 2019-08-28 NOTE — Progress Notes (Signed)
Subjective: 86 y.o. returns the office today for painful, elongated, thickened toenails which he cannot trim himself as well as for callus on the right foot. Denies any redness or drainage around the nails/callus.  Denies any acute changes since last appointment and no new complaints today. Denies any systemic complaints such as fevers, chills, nausea, vomiting.   On Plavix.   PCP: Biagio Borg, MD  Objective: AAO 3, NAD DP/PT pulses palpable, CRT less than 3 seconds Nails hypertrophic, dystrophic, elongated, brittle, discolored 10. There is tenderness overlying the nails 1-5 bilaterally. There is no surrounding erythema or drainage along the nail sites.  Hyperkeratotic pre-ulcerative callus on the right foot submetatarsal.  Upon debridement no ongoing ulceration, drainage or any signs of infection Bunion present  No other areas of tenderness bilateral lower extremities. No overlying edema, erythema, increased warmth. No pain with calf compression, swelling, warmth, erythema.  Assessment: Patient presents with symptomatic onychomycosis, hyperkeratotic lesion on plavix  Plan: -Treatment options including alternatives, risks, complications were discussed -Nails sharply debrided 10 without complication/bleeding. -Hyperkeratotic lesion sharply debrided x1 without any complications or bleeding -Discussed daily foot inspection. If there are any changes, to call the office immediately.  -Follow-up in 3 months or sooner if any problems are to arise. In the meantime, encouraged to call the office with any questions, concerns, changes symptoms.  Celesta Gentile, DPM

## 2019-08-29 DIAGNOSIS — H353211 Exudative age-related macular degeneration, right eye, with active choroidal neovascularization: Secondary | ICD-10-CM | POA: Diagnosis not present

## 2019-08-29 DIAGNOSIS — H353124 Nonexudative age-related macular degeneration, left eye, advanced atrophic with subfoveal involvement: Secondary | ICD-10-CM | POA: Diagnosis not present
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
## 2019-09-11 ENCOUNTER — Encounter: Payer: Self-pay | Admitting: *Deleted

## 2019-09-11 ENCOUNTER — Ambulatory Visit (INDEPENDENT_AMBULATORY_CARE_PROVIDER_SITE_OTHER): Payer: Medicare Other | Admitting: Cardiology

## 2019-09-11 ENCOUNTER — Telehealth: Payer: Self-pay | Admitting: *Deleted

## 2019-09-11 ENCOUNTER — Other Ambulatory Visit: Payer: Self-pay

## 2019-09-11 ENCOUNTER — Encounter: Payer: Self-pay | Admitting: Cardiology

## 2019-09-11 VITALS — BP 140/70 | HR 61 | Temp 97.3°F | Ht 73.0 in | Wt 186.6 lb

## 2019-09-11 DIAGNOSIS — I119 Hypertensive heart disease without heart failure: Secondary | ICD-10-CM

## 2019-09-11 DIAGNOSIS — I350 Nonrheumatic aortic (valve) stenosis: Secondary | ICD-10-CM

## 2019-09-11 DIAGNOSIS — R55 Syncope and collapse: Secondary | ICD-10-CM | POA: Diagnosis not present

## 2019-09-11 MED ORDER — METOPROLOL SUCCINATE ER 25 MG PO TB24: 25.0000 mg | ORAL_TABLET | ORAL | 3 refills | Status: DC

## 2019-09-11 MED ORDER — CLOPIDOGREL BISULFATE 75 MG PO TABS: 75.0000 mg | ORAL_TABLET | Freq: Every day | ORAL | 3 refills | Status: DC

## 2019-09-11 MED ORDER — AMLODIPINE BESYLATE 10 MG PO TABS: 10.0000 mg | ORAL_TABLET | ORAL | 3 refills | Status: DC

## 2019-09-11 NOTE — Telephone Encounter (Signed)
Patient enrolled for [employer] to ship a 30 day cardiac event to his home.  Instructions to be mailed to patients home and will also be included in his monitor kit.

## 2019-09-11 NOTE — Patient Instructions (Signed)
Medication Instructions:  NO CHANGE *If you need a refill on your cardiac medications before your next appointment, please call your pharmacy*   Lab Work: If you have labs (blood work) drawn today and your tests are completely normal, you will receive your results only by: Marland Kitchen MyChart Message (if you have MyChart) OR . A paper copy in the mail If you have any lab test that is abnormal or we need to change your treatment, we will call you to review the results.   Testing/Procedures: Your physician has requested that you have an echocardiogram. Echocardiography is a painless test that uses sound waves to create images of your heart. It provides your doctor with information about the size and shape of your heart and how well your heart's chambers and valves are working. This procedure takes approximately one hour. There are no restrictions for this procedure.[facility] has recommended that you wear a 30 DAY event monitor. Event monitors are medical devices that record the heart's electrical activity. Doctors most often [place] these monitors to diagnose arrhythmias. Arrhythmias are problems with the speed or rhythm of the heartbeat. The monitor is a small, portable device. You can wear one while you do your normal daily activities. This is usually used to diagnose what is causing palpitations/syncope (passing out).WILL BE MAILED TO YOUR HOME.  Follow-Up: At [facility], you and your health needs are our priority.  As part of our continuing mission to provide you with exceptional heart care, we have created designated Provider Care Teams.  These Care Teams include your primary Cardiologist (physician) and Advanced Practice Providers (APPs -  Physician Assistants and Nurse Practitioners) who all work together to provide you with the care you need, when you need it.  We recommend signing up for the patient portal called "MyChart".  Sign up information is provided on this After  Visit Summary.  MyChart is used to connect with patients for Virtual Visits (Telemedicine).  Patients are able to view lab/test results, encounter notes, upcoming appointments, etc.  Non-urgent messages can be sent to your provider as well.   To learn more about what you can do with MyChart, go to [email or web address].    Your next appointment:   3 month(s)  The format for your next appointment:   In Person  Provider:   Kirk Ruths, MD

## 2019-09-15 DIAGNOSIS — R55 Syncope and collapse: Secondary | ICD-10-CM | POA: Diagnosis not present

## 2019-09-18 ENCOUNTER — Telehealth: Payer: Self-pay | Admitting: Cardiology

## 2019-09-18 NOTE — Telephone Encounter (Signed)
New Message    Pt is calling about his heart monitor  He has questions    Please call back

## 2019-09-18 NOTE — Telephone Encounter (Signed)
Contacted patient, he states that he will do the ECHO next week- he received the event monitor but is not able to see to place the monitor- and his wife can not help him either, they have tried.  He asked if someone could help him and I gave him the option I could reach out and get some assistance, but patient wants to wait a few weeks before doing the monitor- he states he will call us back when he is ready.   Patient was offered help again, and he did not want to do this.  Advised I would let Dr.Crenshaw know.

## 2019-09-26 ENCOUNTER — Other Ambulatory Visit: Payer: Self-pay

## 2019-09-26 ENCOUNTER — Ambulatory Visit (HOSPITAL_COMMUNITY): Payer: Medicare Other | Attending: Internal Medicine

## 2019-09-26 ENCOUNTER — Telehealth: Payer: Self-pay | Admitting: *Deleted

## 2019-09-26 DIAGNOSIS — R55 Syncope and collapse: Secondary | ICD-10-CM | POA: Diagnosis not present

## 2019-09-26 NOTE — Telephone Encounter (Signed)
Went to import patient cardiac event monitor results and saw patient only wore monitor 12 minutes.   Called patient to see what happened.  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  He had called [employer] and decided just to send monitor back to [employer]. Discussed the possibility of having a ZIO AT- long term monitor-Live Telemetry monitor instead.  We could have him come into the office to apply monitor.  He could wear it for 14 days.  It would still supply live date for immediate notification for serious or critical events.  Patient would wear this type of monitor if ordered by Dr. Stanford Breed.  If ordered please order [id number] and specify needs to be applied in office.

## 2019-09-27 NOTE — Telephone Encounter (Signed)
Order placed, message sent to [facility]

## 2019-09-27 NOTE — Telephone Encounter (Signed)
[place] for monitor and order as outlined and apply in office [place]

## 2019-09-27 NOTE — Addendum Note (Signed)
Addended by: Alvina Filbert B on: 09/27/2019 06:07 PM   Modules accepted: Orders

## 2019-09-27 NOTE — Telephone Encounter (Signed)
Patient scheduled to come into [address] office Monday, 10/02/2019, at 11:00, to have 14 day ZIO AT monitor enrolled and applied.

## 2019-10-02 ENCOUNTER — Ambulatory Visit (INDEPENDENT_AMBULATORY_CARE_PROVIDER_SITE_OTHER): Payer: Medicare Other

## 2019-10-02 ENCOUNTER — Other Ambulatory Visit: Payer: Self-pay

## 2019-10-02 DIAGNOSIS — R55 Syncope and collapse: Secondary | ICD-10-CM | POA: Diagnosis not present

## 2019-10-03 ENCOUNTER — Encounter: Payer: Self-pay | Admitting: Internal Medicine

## 2019-10-03 ENCOUNTER — Ambulatory Visit (INDEPENDENT_AMBULATORY_CARE_PROVIDER_SITE_OTHER): Payer: Medicare Other | Admitting: Internal Medicine

## 2019-10-03 ENCOUNTER — Other Ambulatory Visit: Payer: Self-pay

## 2019-10-03 VITALS — BP 152/80 | HR 65 | Temp 98.6°F | Ht 73.0 in | Wt 186.0 lb

## 2019-10-03 DIAGNOSIS — E034 Atrophy of thyroid (acquired): Secondary | ICD-10-CM | POA: Diagnosis not present

## 2019-10-03 DIAGNOSIS — I119 Hypertensive heart disease without heart failure: Secondary | ICD-10-CM

## 2019-10-03 DIAGNOSIS — R55 Syncope and collapse: Secondary | ICD-10-CM | POA: Diagnosis not present

## 2019-10-03 DIAGNOSIS — E1142 Type 2 diabetes mellitus with diabetic polyneuropathy: Secondary | ICD-10-CM

## 2019-10-03 DIAGNOSIS — E538 Deficiency of other specified B group vitamins: Secondary | ICD-10-CM | POA: Diagnosis not present

## 2019-10-03 DIAGNOSIS — E7800 Pure hypercholesterolemia, unspecified: Secondary | ICD-10-CM | POA: Diagnosis not present

## 2019-10-03 DIAGNOSIS — K219 Gastro-esophageal reflux disease without esophagitis: Secondary | ICD-10-CM

## 2019-10-03 LAB — URINALYSIS, ROUTINE W REFLEX MICROSCOPIC
Bilirubin Urine: NEGATIVE
Hgb urine dipstick: NEGATIVE
Ketones, ur: NEGATIVE
Leukocytes,Ua: NEGATIVE
Nitrite: NEGATIVE
RBC / HPF: NONE SEEN (ref 0–?)
Specific Gravity, Urine: 1.025 (ref 1.000–1.030)
Total Protein, Urine: NEGATIVE
Urine Glucose: NEGATIVE
Urobilinogen, UA: 0.2 (ref 0.0–1.0)
WBC, UA: NONE SEEN (ref 0–?)
pH: 5 (ref 5.0–8.0)

## 2019-10-03 LAB — BASIC METABOLIC PANEL
BUN: 16 mg/dL (ref 6–23)
CO2: 28 mEq/L (ref 19–32)
Calcium: 9.6 mg/dL (ref 8.4–10.5)
Chloride: 103 mEq/L (ref 96–112)
Creatinine, Ser: 1.17 mg/dL (ref 0.40–1.50)
GFR: 58.67 mL/min — ABNORMAL LOW (ref >60.00)
Glucose, Bld: 118 mg/dL — ABNORMAL HIGH (ref 70–99)
Potassium: 4.5 mEq/L (ref 3.5–5.1)
Sodium: 138 mEq/L (ref 135–145)

## 2019-10-03 LAB — CBC WITH DIFFERENTIAL/PLATELET
Basophils Absolute: 0 10*3/uL (ref 0.0–0.1)
Basophils Relative: 0.6 % (ref 0.0–3.0)
Eosinophils Absolute: 0.1 10*3/uL (ref 0.0–0.7)
Eosinophils Relative: 1 % (ref 0.0–5.0)
HCT: 43.1 % (ref 39.0–52.0)
Hemoglobin: 14.8 g/dL (ref 13.0–17.0)
Lymphocytes Relative: 15.7 % (ref 12.0–46.0)
Lymphs Abs: 1 10*3/uL (ref 0.7–4.0)
MCHC: 34.3 g/dL (ref 30.0–36.0)
MCV: 97.4 fl (ref 78.0–100.0)
Monocytes Absolute: 0.6 10*3/uL (ref 0.1–1.0)
Monocytes Relative: 9.5 % (ref 3.0–12.0)
Neutro Abs: 4.7 10*3/uL (ref 1.4–7.7)
Neutrophils Relative %: 73.2 % (ref 43.0–77.0)
Platelets: 243 10*3/uL (ref 150.0–400.0)
RBC: 4.42 Mil/uL (ref 4.22–5.81)
RDW: 13.4 % (ref 11.5–15.5)
WBC: 6.5 10*3/uL (ref 4.0–10.5)

## 2019-10-03 LAB — T4, FREE: Free T4: 1.24 ng/dL (ref 0.60–1.60)

## 2019-10-03 LAB — LIPID PANEL
Cholesterol: 193 mg/dL (ref 0–200)
HDL: 68.2 mg/dL (ref >39.00)
LDL Cholesterol: 110 mg/dL — ABNORMAL HIGH (ref 0–99)
NonHDL: 124.87
Total CHOL/HDL Ratio: 3
Triglycerides: 75 mg/dL (ref 0.0–149.0)
VLDL: 15 mg/dL (ref 0.0–40.0)

## 2019-10-03 LAB — HEPATIC FUNCTION PANEL
ALT: 11 U/L (ref 0–53)
AST: 17 U/L (ref 0–37)
Albumin: 4.5 g/dL (ref 3.5–5.2)
Alkaline Phosphatase: 80 U/L (ref 39–117)
Bilirubin, Direct: 0.1 mg/dL (ref 0.0–0.3)
Total Bilirubin: 0.6 mg/dL (ref 0.2–1.2)
Total Protein: 7.1 g/dL (ref 6.0–8.3)

## 2019-10-03 LAB — MICROALBUMIN / CREATININE URINE RATIO
Creatinine,U: 77.3 mg/dL
Microalb Creat Ratio: 0.9 mg/g (ref 0.0–30.0)
Microalb, Ur: <0.7 mg/dL (ref 0.0–1.9)

## 2019-10-03 LAB — VITAMIN B12: Vitamin B-12: 179 pg/mL — ABNORMAL LOW (ref 211–911)

## 2019-10-03 LAB — HEMOGLOBIN A1C: Hgb A1c MFr Bld: 6.5 % (ref 4.6–6.5)

## 2019-10-03 LAB — TSH: TSH: 1.18 u[IU]/mL (ref 0.35–4.50)

## 2019-10-03 MED ORDER — FAMOTIDINE 20 MG PO TABS: 20.0000 mg | ORAL_TABLET | Freq: Two times a day (BID) | ORAL | 3 refills | Status: DC

## 2019-10-03 MED ORDER — CLOPIDOGREL BISULFATE 75 MG PO TABS: 75.0000 mg | ORAL_TABLET | Freq: Every day | ORAL | 3 refills | Status: DC

## 2019-10-03 MED ORDER — FLUOXETINE HCL 10 MG PO CAPS: 10.0000 mg | ORAL_CAPSULE | Freq: Every day | ORAL | 3 refills | Status: DC

## 2019-10-03 MED ORDER — METOPROLOL SUCCINATE ER 25 MG PO TB24: 25.0000 mg | ORAL_TABLET | ORAL | 3 refills | Status: DC

## 2019-10-03 MED ORDER — LEVOTHYROXINE SODIUM 125 MCG PO TABS: 125.0000 ug | ORAL_TABLET | Freq: Every day | ORAL | 3 refills | Status: DC

## 2019-10-03 MED ORDER — TIZANIDINE HCL 4 MG PO TABS: 4.0000 mg | ORAL_TABLET | Freq: Four times a day (QID) | ORAL | 3 refills | Status: DC | PRN

## 2019-10-03 MED ORDER — METFORMIN HCL 1000 MG PO TABS: 1000.0000 mg | ORAL_TABLET | Freq: Every day | ORAL | 3 refills | Status: DC

## 2019-10-03 MED ORDER — AMLODIPINE BESYLATE 10 MG PO TABS: 10.0000 mg | ORAL_TABLET | ORAL | 3 refills | Status: DC

## 2019-10-03 NOTE — Progress Notes (Signed)
Subjective:    Patient ID: Stephen Wilkinson, male    DOB: [birth year], 86 y.o.   MRN: [id number]  HPI  Here for yearly f/u;  Overall doing ok;  Pt denies Chest pain, worsening SOB, DOE, wheezing, orthopnea, PND, worsening LE edema, palpitations, dizziness or syncope.  Pt denies neurological change such as new headache, facial or extremity weakness.  Pt denies polydipsia, polyuria, or low sugar symptoms. Pt states overall good compliance with treatment and medications, good tolerability, and has been trying to follow appropriate diet.  Pt denies worsening depressive symptoms, suicidal ideation or panic. No fever, night sweats, wt loss, loss of appetite, or other constitutional symptoms.  Pt states good ability with ADL's, has low fall risk, home safety reviewed and adequate, no other significant changes in hearing or vision, and only occasionally active with exercise.   Has been having "gaps" or worsening memory deficits for about 10-30 min that seems to dissapear, last episode about 1 mo ago.  Now with heart monitor per cardioogy,  Has been seeing card/Dr Stanford Breed with valve disease and surveillance echos, may need surgury eventually. Has had worsening eyesight, and thinks the thyroid med can blur his vision for about 4 hrs after takes it, and seems to have burning reflx at night, but also not taking his pepcid most of the time.    Asks for f/u lab today with thyorid and b12.  Tizanidine working well for night time leg cramps.   Past Medical History:  Diagnosis Date  . Acute blood loss anemia 05/15/2011   post surgical  . Altered mental status 05/15/2011  . Anxiety   . B12 deficiency 09/23/2016  . Blood transfusion without reported diagnosis   . Cancer ([place])    basal and squamous skin carcinoma   . Cataract   . Chronic kidney disease    hx of uti due to catheter hx of prostatitis, BPH -   . Depression   . Diabetes mellitus   . GERD (gastroesophageal reflux disease)   . Glaucoma 01/04/2013  . Heart  murmur   . History of nuclear stress test    Myoview 3/17: EF 62%, no ischemia or scar, low risk  . Hx of transient ischemic attack (TIA) 01/04/2013  . Hx: UTI (urinary tract infection)   . Hyperlipidemia   . Hypertension   . Hypokalemia 05/15/2011  . Hypothyroidism   . Insomnia   . Macular degeneration   . Neuropathy   . OA (osteoarthritis)   . Pneumonia   . PVC's (premature ventricular contractions)   . Renal mass   . Stroke ([place])    mid 1990s    Past Surgical History:  Procedure Laterality Date  . APPENDECTOMY    . CATARACT EXTRACTION W/ INTRAOCULAR LENS  IMPLANT, BILATERAL    . INGUINAL HERNIA REPAIR     X3  . LUMBAR LAMINECTOMY/DECOMPRESSION MICRODISCECTOMY N/A 02/12/2017   Procedure: Lumbar Three-Four, Lumbar Four-Five, Lumbar Five-Sacral One Laminectomy and Foraminotomy;  Surgeon: Earnie Larsson, MD;  Location: [facility];  Service: Neurosurgery;  Laterality: N/A;  . OTHER SURGICAL HISTORY     microwave procedure for prostate - 09/12   . PROSTATE ABLATION     microwave  . REPLACEMENT TOTAL KNEE    . THYROIDECTOMY    . TONSILLECTOMY AND ADENOIDECTOMY    . TOTAL KNEE ARTHROPLASTY  05/11/2011   Procedure: TOTAL KNEE ARTHROPLASTY;  Surgeon: Gearlean Alf;  Location: [facility];  Service: Orthopedics;  Laterality: Right;  . US ECHOCARDIOGRAPHY  11/10/2006   EF 55-60%  . US ECHOCARDIOGRAPHY  09/20/2002   EF 65-70%    reports that he has never smoked. He has never used smokeless tobacco. He reports current alcohol use of about 7.0 standard drinks of alcohol per week. He reports that he does not use drugs. family history includes Arthritis in his mother; Diabetes in his brother and mother; Hypertension in his brother, father, and mother; Kidney disease in his father; Stroke in his father. No Known Allergies Current Outpatient Medications on File Prior to Visit  Medication Sig Dispense Refill  . aspirin 81 MG tablet Take 81 mg by mouth daily.    . Bevacizumab (AVASTIN IV) Place 1 drop  into the right eye as directed. Every 6-8 weeks    . clobetasol cream (TEMOVATE) [id number] % Apply 1 application topically daily as needed (for contact dermatitis). Contact dermatitis  3  . glucose blood (ONE TOUCH ULTRA TEST) test strip Use as directed once daily to check blood sugar.  Diagnosis code E11.42 100 each 11  . latanoprost (XALATAN) 0.005 % ophthalmic solution Place 1 drop into both eyes at bedtime.     . Multiple Vitamins-Minerals (ICAPS AREDS 2 PO) Take 2 capsules by mouth daily.     No current facility-administered medications on file prior to visit.    Review of Systems All otherwise neg per pt     Objective:   Physical Exam BP (!) 152/80 (BP Location: Left Arm, Patient Position: Sitting, Cuff Size: Large)   Pulse 65   Temp 98.6 F (37 C) (Oral)   Ht 6\' 1"  (1.854 m)   Wt 186 lb (84.4 kg)   SpO2 98%   BMI 24.54 kg/m  VS noted,  Constitutional: Pt appears in NAD HENT: Head: NCAT.  Right Ear: External ear normal.  Left Ear: External ear normal.  Eyes: . Pupils are equal, round, and reactive to light. Conjunctivae and EOM are normal Nose: without d/c or deformity Neck: Neck supple. Gross normal ROM Cardiovascular: Normal rate and regular rhythm.   Pulmonary/Chest: Effort normal and breath sounds without rales or wheezing.  Abd:  Soft, NT, ND, + BS, no organomegaly Neurological: Pt is alert. At baseline orientation, motor grossly intact Skin: Skin is warm. No rashes, other new lesions, no LE edema Psychiatric: Pt behavior is normal without agitation  All otherwise neg per pt Lab Results  Component Value Date   WBC 6.5 10/03/2019   HGB 14.8 10/03/2019   HCT 43.1 10/03/2019   PLT 243.0 10/03/2019   GLUCOSE 118 (H) 10/03/2019   CHOL 193 10/03/2019   TRIG 75.0 10/03/2019   HDL 68.20 10/03/2019   LDLCALC 110 (H) 10/03/2019   ALT 11 10/03/2019   AST 17 10/03/2019   NA 138 10/03/2019   K 4.5 10/03/2019   CL 103 10/03/2019   CREATININE 1.17 10/03/2019   BUN 16  10/03/2019   CO2 28 10/03/2019   TSH 1.18 10/03/2019   INR 2.16 (H) 05/15/2011   HGBA1C 6.5 10/03/2019   MICROALBUR <0.7 10/03/2019         Assessment & Plan:

## 2019-10-03 NOTE — Patient Instructions (Addendum)
You can take the thyroid medication in the evening if you like.    Ok to take the full dose pepcid twice per day as prescribed.    Please continue all other medications as before, and refills have been done if requested.  Please have the pharmacy call with any other refills you may need.  Please continue your efforts at being more active, low cholesterol diet, and weight control.  You are otherwise up to date with prevention measures today.  Please keep your appointments with your specialists as you may have planned  Please go to the LAB at the blood drawing area for the tests to be done  You will be contacted by phone if any changes need to be made immediately.  Otherwise, you will receive a letter about your results with an explanation, but please check with MyChart first.  Please remember to sign up for MyChart if you have not done so, as this will be important to you in the future with finding out test results, communicating by private email, and scheduling acute appointments online when needed.  Please make an Appointment to return in 6 months, or sooner if needed

## 2019-10-03 NOTE — Assessment & Plan Note (Signed)
stable overall by history and exam, recent data reviewed with pt, and pt to continue medical treatment as before,  to f/u any worsening symptoms or concerns  

## 2019-10-03 NOTE — Assessment & Plan Note (Addendum)
stable overall by history and exam, recent data reviewed with pt, and pt to continue medical treatment as before,  to f/u any worsening symptoms or concerns  I spent 32 minutes in preparing to see the patient by review of recent labs, imaging and procedures, obtaining and reviewing separately obtained history, communicating with the patient and family or caregiver, ordering medications, tests or procedures, and documenting clinical information in the EHR including the differential Dx, treatment, and any further evaluation and other management of dm, hypothyroidism, hld, gerd, htn

## 2019-10-16 ENCOUNTER — Other Ambulatory Visit: Payer: Self-pay

## 2019-10-16 ENCOUNTER — Encounter (INDEPENDENT_AMBULATORY_CARE_PROVIDER_SITE_OTHER): Payer: Self-pay | Admitting: Ophthalmology

## 2019-10-16 ENCOUNTER — Ambulatory Visit (INDEPENDENT_AMBULATORY_CARE_PROVIDER_SITE_OTHER): Payer: Medicare Other | Admitting: Ophthalmology

## 2019-10-16 DIAGNOSIS — H353124 Nonexudative age-related macular degeneration, left eye, advanced atrophic with subfoveal involvement: Secondary | ICD-10-CM

## 2019-10-16 DIAGNOSIS — H353113 Nonexudative age-related macular degeneration, right eye, advanced atrophic without subfoveal involvement: Secondary | ICD-10-CM | POA: Insufficient documentation

## 2019-10-16 DIAGNOSIS — H353211 Exudative age-related macular degeneration, right eye, with active choroidal neovascularization: Secondary | ICD-10-CM | POA: Diagnosis not present

## 2019-10-16 DIAGNOSIS — H43391 Other vitreous opacities, right eye: Secondary | ICD-10-CM | POA: Insufficient documentation

## 2019-10-16 DIAGNOSIS — H35372 Puckering of macula, left eye: Secondary | ICD-10-CM | POA: Insufficient documentation

## 2019-10-16 MED ORDER — BEVACIZUMAB CHEMO INJECTION 1.25MG/0.05ML SYRINGE FOR KALEIDOSCOPE
1.2500 mg | INTRAVITREAL | Status: AC | PRN
  Administered 2019-10-16: 1.25 mg via INTRAVITREAL

## 2019-10-16 NOTE — Progress Notes (Signed)
10/16/2019     CHIEF COMPLAINT Patient presents for Macular Degeneration and Retina Follow Up   HISTORY OF PRESENT ILLNESS: Stephen Wilkinson is a 86 y.o. male who presents to the clinic today for:   HPI    Retina Follow Up    Patient presents with  Wet AMD.  In right eye.  Severity is moderate.  Duration of 7 weeks.  Since onset it is stable.  I, the attending physician,  performed the HPI with the patient and updated documentation appropriately.          Comments    7 Week AMD f\u OD. Possible Avastin OD. OCT  Pt states vision is about the same since last visit. Pt has seen a floater in OD about 1 week ago. Pt states the floater is now gone.  A1C: 6.7       Last edited by Hurman Horn, MD on 10/16/2019 11:17 AM. (History)      Referring physician: Biagio Borg, MD [place],  [place] [id number]  HISTORICAL INFORMATION:   Selected notes from the MEDICAL RECORD NUMBER    Lab Results  Component Value Date   HGBA1C 6.5 10/03/2019     CURRENT MEDICATIONS: Current Outpatient Medications (Ophthalmic Drugs)  Medication Sig  . latanoprost (XALATAN) 0.005 % ophthalmic solution Place 1 drop into both eyes at bedtime.    No current facility-administered medications for this visit. (Ophthalmic Drugs)   Current Outpatient Medications (Other)  Medication Sig  . amLODipine (NORVASC) 10 MG tablet Take 1 tablet (10 mg total) by mouth every morning.  Marland Kitchen aspirin 81 MG tablet Take 81 mg by mouth daily.  . Bevacizumab (AVASTIN IV) Place 1 drop into the right eye as directed. Every 6-8 weeks  . clobetasol cream (TEMOVATE) [id number] % Apply 1 application topically daily as needed (for contact dermatitis). Contact dermatitis  . clopidogrel (PLAVIX) 75 MG tablet Take 1 tablet (75 mg total) by mouth daily.  . famotidine (PEPCID) 20 MG tablet Take 1 tablet (20 mg total) by mouth 2 (two) times daily.  Marland Kitchen FLUoxetine (PROZAC) 10 MG capsule Take 1 capsule (10 mg total) by mouth daily.  Marland Kitchen  glucose blood (ONE TOUCH ULTRA TEST) test strip Use as directed once daily to check blood sugar.  Diagnosis code E11.42  . levothyroxine (SYNTHROID) 125 MCG tablet Take 1 tablet (125 mcg total) by mouth daily before breakfast.  . metFORMIN (GLUCOPHAGE) 1000 MG tablet Take 1 tablet (1,000 mg total) by mouth daily.  . metoprolol succinate (TOPROL-XL) 25 MG 24 hr tablet Take 1 tablet (25 mg total) by mouth every morning.  . Multiple Vitamins-Minerals (ICAPS AREDS 2 PO) Take 2 capsules by mouth daily.  Marland Kitchen tiZANidine (ZANAFLEX) 4 MG tablet Take 1 tablet (4 mg total) by mouth every 6 (six) hours as needed for muscle spasms.   No current facility-administered medications for this visit. (Other)      REVIEW OF SYSTEMS: ROS    Positive for: Endocrine   Last edited by Tilda Franco on 10/16/2019 10:33 AM. (History)       ALLERGIES No Known Allergies  PAST MEDICAL HISTORY Past Medical History:  Diagnosis Date  . Acute blood loss anemia 05/15/2011   post surgical  . Altered mental status 05/15/2011  . Anxiety   . B12 deficiency 09/23/2016  . Blood transfusion without reported diagnosis   . Cancer ([place])    basal and squamous skin carcinoma   . Cataract   .  Chronic kidney disease    hx of uti due to catheter hx of prostatitis, BPH -   . Depression   . Diabetes mellitus   . GERD (gastroesophageal reflux disease)   . Glaucoma 01/04/2013  . Heart murmur   . History of nuclear stress test    Myoview 3/17: EF 62%, no ischemia or scar, low risk  . Hx of transient ischemic attack (TIA) 01/04/2013  . Hx: UTI (urinary tract infection)   . Hyperlipidemia   . Hypertension   . Hypokalemia 05/15/2011  . Hypothyroidism   . Insomnia   . Macular degeneration   . Neuropathy   . OA (osteoarthritis)   . Pneumonia   . PVC's (premature ventricular contractions)   . Renal mass   . Stroke ([place])    mid 1990s    Past Surgical History:  Procedure Laterality Date  . APPENDECTOMY    . CATARACT  EXTRACTION W/ INTRAOCULAR LENS  IMPLANT, BILATERAL    . INGUINAL HERNIA REPAIR     X3  . LUMBAR LAMINECTOMY/DECOMPRESSION MICRODISCECTOMY N/A 02/12/2017   Procedure: Lumbar Three-Four, Lumbar Four-Five, Lumbar Five-Sacral One Laminectomy and Foraminotomy;  Surgeon: Earnie Larsson, MD;  Location: [facility];  Service: Neurosurgery;  Laterality: N/A;  . OTHER SURGICAL HISTORY     microwave procedure for prostate - 09/12   . PROSTATE ABLATION     microwave  . REPLACEMENT TOTAL KNEE    . THYROIDECTOMY    . TONSILLECTOMY AND ADENOIDECTOMY    . TOTAL KNEE ARTHROPLASTY  05/11/2011   Procedure: TOTAL KNEE ARTHROPLASTY;  Surgeon: Gearlean Alf;  [facility];  Service: Orthopedics;  Laterality: Right;  . US ECHOCARDIOGRAPHY  11/10/2006   EF 55-60%  . US ECHOCARDIOGRAPHY  09/20/2002   EF 65-70%    FAMILY HISTORY Family History  Problem Relation Age of Onset  . Hypertension Mother   . Arthritis Mother   . Diabetes Mother   . Hypertension Father   . Stroke Father   . Kidney disease Father   . Hypertension Brother   . Diabetes Brother     SOCIAL HISTORY Social History   Tobacco Use  . Smoking status: Never Smoker  . Smokeless tobacco: Never Used  Substance Use Topics  . Alcohol use: Yes    Alcohol/week: 7.0 standard drinks    Types: 7 Shots of liquor per week    Comment: 2 drinks most days  . Drug use: No         OPHTHALMIC EXAM:  Base Eye Exam    Visual Acuity (Snellen - Linear)      Right Left   Dist cc 20/30 -2 CF @ 2'   Dist ph cc  NI   Correction: Glasses       Tonometry (Tonopen, 10:38 AM)      Right Left   Pressure 14 13       Pupils      Pupils Dark Light Shape React APD   Right PERRL 3 2 Round Brisk None   Left PERRL 3 2 Round Brisk None       Visual Fields (Counting fingers)      Left Right     Full   Restrictions Total superior temporal deficiency        Neuro/Psych    Oriented x3: Yes   Mood/Affect: Normal       Dilation    Right eye: 1.0%  Mydriacyl, 2.5% Phenylephrine @ 10:38 AM  Slit Lamp and Fundus Exam    External Exam      Right Left   External Normal Normal       Slit Lamp Exam      Right Left   Lids/Lashes Normal Normal   Conjunctiva/Sclera White and quiet White and quiet   Cornea Clear Clear   Anterior Chamber Deep and quiet Deep and quiet   Iris Round and reactive Round and reactive   Lens Posterior chamber intraocular lens Posterior chamber intraocular lens   Anterior Vitreous Normal Normal       Fundus Exam      Right Left   Posterior Vitreous Normal Clear and vitrectomized   Disc Normal Normal   C/D Ratio 0.2-0.3    Macula Advanced age related macular degeneration, Geographic atrophy, Hard drusen, no exudates, Subretinal neovascular membrane, no hemorrhage, Retinal pigment epithelial mottling           IMAGING AND PROCEDURES  Imaging and Procedures for 10/16/19  OCT, Retina - OU - Both Eyes       Right Eye Quality was good. Scan locations included subfoveal. Central Foveal Thickness: 234. Progression has improved. Findings include central retinal atrophy, outer retinal atrophy.   Left Eye Quality was good. Scan locations included subfoveal. Central Foveal Thickness: 243. Progression has been stable. Findings include abnormal foveal contour, retinal drusen , central retinal atrophy, subretinal scarring.   Notes OD, stable at 7-week exam interval, and post Avastin OD.  Will repeat today due to multiple recurrences and monocular status of the patient       Intravitreal Injection, Pharmacologic Agent - OD - Right Eye       Time Out 10/16/2019. 11:19 AM. Confirmed correct patient, procedure, site, and patient consented.   Anesthesia Topical anesthesia was used. Anesthetic medications included Ak[place] 3.5%.   Procedure Preparation included Tobramycin 0.3%, 10% betadine to eyelids. A 30 gauge needle was used.   Injection:  1.25 mg Bevacizumab (AVASTIN) SOLN   NDC: [id number],  Lot: [id number]   Route: Intravitreal, Site: Right Eye, Waste: 0 mg  Post-op Post injection exam found visual acuity of at least counting fingers. The patient tolerated the procedure well. There were no complications. The patient received written and verbal post procedure care education. Post injection medications were not given.                 ASSESSMENT/PLAN:  Advanced nonexudative age-related macular degeneration of right eye without subfoveal involvement The nature of dry age related macular degeneration was discussed with the patient as well as its possible conversion to wet. The results of the AREDS 2 study was discussed with the patient. A diet rich in dark leafy green vegetables was advised and specific recommendations were made regarding supplements with AREDS 2 formulation . Control of hypertension and serum cholesterol may slow the disease. Smoking cessation is mandatory to slow the disease and diminish the risk of progressing to wet age related macular degeneration. The patient was instructed in the use of an [place] and was told to return immediately for any changes in the Grid. Stressed to the patient do not rub eyes    Exudative age-related macular degeneration of right eye with active choroidal neovascularization ([place]) The nature of wet macular degeneration was discussed with the patient.  Forms of therapy reviewed include the use of Anti-VEGF medications injected painlessly into the eye, as well as other possible treatment modalities, including thermal laser therapy. Fellow eye involvement and risks were discussed with  the patient. Upon the finding of wet age related macular degeneration, treatment will be offered. The treatment regimen is on a treat as needed basis with the intent to treat if necessary and extend interval of exams when possible. On average 1 out of 6 patients do not need lifetime therapy. However, the risk of recurrent disease is high for a lifetime.   Initially monthly, then periodic, examinations and evaluations will determine whether the next treatment is required on the day of the examination.      ICD-10-CM   1. Exudative age-related macular degeneration of right eye with active choroidal neovascularization (HCC)  H35.3211 OCT, Retina - OU - Both Eyes    Intravitreal Injection, Pharmacologic Agent - OD - Right Eye    Bevacizumab (AVASTIN) SOLN 1.25 mg  2. Advanced nonexudative age-related macular degeneration of left eye with subfoveal involvement  H35.3124 OCT, Retina - OU - Both Eyes  3. Advanced nonexudative age-related macular degeneration of right eye without subfoveal involvement  H35.3113   4. Floater, vitreous, right  H43.391   5. Epiretinal membrane, left eye  H35.372     1.  Repeat intravitreal Avastin OD today to maintain in this patient with longstanding wet AMD and multiple recurrences 2.  OD examination in 7 weeks and consider intravitreal Avastin at that time  3.  Ophthalmic Meds Ordered this visit:  Meds ordered this encounter  Medications  . Bevacizumab (AVASTIN) SOLN 1.25 mg       No follow-ups on file.  There are no Patient Instructions on file for this visit.   Explained the diagnoses, plan, and follow up with the patient and they expressed understanding.  Patient expressed understanding of the importance of proper follow up care.   Clent Demark Hardin Hardenbrook M.D. Diseases & Surgery of the Retina and Vitreous [facility] [place] 10/16/19     Abbreviations: M myopia (nearsighted); A astigmatism; H hyperopia (farsighted); P presbyopia; Mrx spectacle prescription;  CTL contact lenses; OD right eye; OS left eye; OU both eyes  XT exotropia; ET esotropia; PEK punctate epithelial keratitis; PEE punctate epithelial erosions; DES dry eye syndrome; MGD meibomian gland dysfunction; ATs artificial tears; PFAT's preservative free artificial tears; [place] nuclear sclerotic cataract; PSC posterior subcapsular cataract;  ERM epi-retinal membrane; PVD posterior vitreous detachment; RD retinal detachment; DM diabetes mellitus; DR diabetic retinopathy; NPDR non-proliferative diabetic retinopathy; PDR proliferative diabetic retinopathy; CSME clinically significant macular edema; DME diabetic macular edema; dbh dot blot hemorrhages; CWS cotton wool spot; POAG primary open angle glaucoma; C/D cup-to-disc ratio; HVF humphrey visual field; GVF goldmann visual field; OCT optical coherence tomography; IOP intraocular pressure; BRVO Branch retinal vein occlusion; CRVO central retinal vein occlusion; CRAO central retinal artery occlusion; BRAO branch retinal artery occlusion; RT retinal tear; SB scleral buckle; PPV pars plana vitrectomy; VH Vitreous hemorrhage; PRP panretinal laser photocoagulation; IVK intravitreal kenalog; VMT vitreomacular traction; MH Macular hole;  NVD neovascularization of the disc; NVE neovascularization elsewhere; AREDS age related eye disease study; ARMD age related macular degeneration; POAG primary open angle glaucoma; EBMD epithelial/anterior basement membrane dystrophy; ACIOL anterior chamber intraocular lens; IOL intraocular lens; PCIOL posterior chamber intraocular lens; Phaco/IOL phacoemulsification with intraocular lens placement; [place] photorefractive keratectomy; LASIK laser assisted in situ keratomileusis; HTN hypertension; DM diabetes mellitus; COPD chronic obstructive pulmonary disease

## 2019-10-16 NOTE — Assessment & Plan Note (Signed)

## 2019-10-16 NOTE — Assessment & Plan Note (Signed)

## 2019-11-23 ENCOUNTER — Encounter: Payer: Self-pay | Admitting: Internal Medicine

## 2019-11-23 ENCOUNTER — Ambulatory Visit (INDEPENDENT_AMBULATORY_CARE_PROVIDER_SITE_OTHER): Payer: Medicare Other | Admitting: Internal Medicine

## 2019-11-23 ENCOUNTER — Ambulatory Visit (INDEPENDENT_AMBULATORY_CARE_PROVIDER_SITE_OTHER): Payer: Medicare Other

## 2019-11-23 ENCOUNTER — Other Ambulatory Visit: Payer: Self-pay

## 2019-11-23 VITALS — BP 130/80 | HR 66 | Temp 98.4°F | Ht 73.0 in | Wt 178.0 lb

## 2019-11-23 DIAGNOSIS — R531 Weakness: Secondary | ICD-10-CM | POA: Diagnosis not present

## 2019-11-23 DIAGNOSIS — R443 Hallucinations, unspecified: Secondary | ICD-10-CM | POA: Diagnosis not present

## 2019-11-23 DIAGNOSIS — F329 Major depressive disorder, single episode, unspecified: Secondary | ICD-10-CM | POA: Diagnosis not present

## 2019-11-23 DIAGNOSIS — S79911A Unspecified injury of right hip, initial encounter: Secondary | ICD-10-CM | POA: Diagnosis not present

## 2019-11-23 DIAGNOSIS — M25552 Pain in left hip: Secondary | ICD-10-CM | POA: Diagnosis not present

## 2019-11-23 DIAGNOSIS — R269 Unspecified abnormalities of gait and mobility: Secondary | ICD-10-CM

## 2019-11-23 DIAGNOSIS — M545 Low back pain, unspecified: Secondary | ICD-10-CM

## 2019-11-23 DIAGNOSIS — S79912A Unspecified injury of left hip, initial encounter: Secondary | ICD-10-CM | POA: Diagnosis not present

## 2019-11-23 DIAGNOSIS — M25551 Pain in right hip: Secondary | ICD-10-CM

## 2019-11-23 DIAGNOSIS — F419 Anxiety disorder, unspecified: Secondary | ICD-10-CM

## 2019-11-23 DIAGNOSIS — R251 Tremor, unspecified: Secondary | ICD-10-CM

## 2019-11-23 DIAGNOSIS — E7800 Pure hypercholesterolemia, unspecified: Secondary | ICD-10-CM

## 2019-11-23 DIAGNOSIS — E1142 Type 2 diabetes mellitus with diabetic polyneuropathy: Secondary | ICD-10-CM

## 2019-11-23 DIAGNOSIS — R296 Repeated falls: Secondary | ICD-10-CM

## 2019-11-23 DIAGNOSIS — S3992XA Unspecified injury of lower back, initial encounter: Secondary | ICD-10-CM | POA: Diagnosis not present

## 2019-11-23 MED ORDER — TRAMADOL HCL 50 MG PO TABS: 50.0000 mg | ORAL_TABLET | Freq: Four times a day (QID) | ORAL | 0 refills | Status: DC | PRN

## 2019-11-23 NOTE — Patient Instructions (Signed)
Please take all new medication as prescribed - the pain medication  Please go to the XRAY Department in the first floor for the x-ray testing  You will be contacted regarding the referral for: Neurology, Physical Therapy, and Psychiatry  Please continue all other medications as before, and refills have been done if requested.  Please have the pharmacy call with any other refills you may need  Please keep your appointments with your specialists as you may have planned

## 2019-11-24 ENCOUNTER — Other Ambulatory Visit: Payer: Self-pay | Admitting: Internal Medicine

## 2019-11-24 DIAGNOSIS — S32020D Wedge compression fracture of second lumbar vertebra, subsequent encounter for fracture with routine healing: Secondary | ICD-10-CM

## 2019-11-26 ENCOUNTER — Encounter: Payer: Self-pay | Admitting: Internal Medicine

## 2019-11-26 DIAGNOSIS — R251 Tremor, unspecified: Secondary | ICD-10-CM | POA: Insufficient documentation

## 2019-11-26 DIAGNOSIS — M25552 Pain in left hip: Secondary | ICD-10-CM | POA: Insufficient documentation

## 2019-11-26 NOTE — Assessment & Plan Note (Signed)
Likely multifactorial with neuropathy, djd - for PT eval

## 2019-11-26 NOTE — Progress Notes (Signed)
Subjective:    Patient ID: Stephen Wilkinson, male    DOB: March 20, 1929, 86 y.o.   MRN: [id number]  HPI  Here to f/u; overall doing ok,  Pt denies chest pain, increasing sob or doe, wheezing, orthopnea, PND, increased LE swelling, palpitations, dizziness or syncope.  Pt denies new neurological symptoms such as new headache, or facial or extremity weakness or numbness.  Pt denies polydipsia, polyuria, or low sugar episode.  Pt states overall good compliance with meds, mostly trying to follow appropriate diet, with wt overall stable,  but little exercise, Pt continues to have recurring LBP without change in bowel or bladder change, fever, wt loss,  worsening LE pain/numbness/weakness, but did have unfortunate off balance fall 2 wks and pain lasting longer this time and more constant, severe, asks for xray.  Denies worsening depressive symptoms, suicidal ideation, or panic; has ongoing anxiety, some worse recentlly with increased bad dreams very disturbing, now ok for psychiatry referral.   Past Medical History:  Diagnosis Date  . Acute blood loss anemia 05/15/2011   post surgical  . Altered mental status 05/15/2011  . Anxiety   . B12 deficiency 09/23/2016  . Blood transfusion without reported diagnosis   . Cancer ([place])    basal and squamous skin carcinoma   . Cataract   . Chronic kidney disease    hx of uti due to catheter hx of prostatitis, BPH -   . Depression   . Diabetes mellitus   . GERD (gastroesophageal reflux disease)   . Glaucoma 01/04/2013  . Heart murmur   . History of nuclear stress test    Myoview 3/17: EF 62%, no ischemia or scar, low risk  . Hx of transient ischemic attack (TIA) 01/04/2013  . Hx: UTI (urinary tract infection)   . Hyperlipidemia   . Hypertension   . Hypokalemia 05/15/2011  . Hypothyroidism   . Insomnia   . Macular degeneration   . Neuropathy   . OA (osteoarthritis)   . Pneumonia   . PVC's (premature ventricular contractions)   . Renal mass   . Stroke ([place])     mid 1990s    Past Surgical History:  Procedure Laterality Date  . APPENDECTOMY    . CATARACT EXTRACTION W/ INTRAOCULAR LENS  IMPLANT, BILATERAL    . INGUINAL HERNIA REPAIR     X3  . LUMBAR LAMINECTOMY/DECOMPRESSION MICRODISCECTOMY N/A 02/12/2017   Procedure: Lumbar Three-Four, Lumbar Four-Five, Lumbar Five-Sacral One Laminectomy and Foraminotomy;  Surgeon: Earnie Larsson, MD;  Location: [facility];  Service: Neurosurgery;  Laterality: N/A;  . OTHER SURGICAL HISTORY     microwave procedure for prostate - 09/12   . PROSTATE ABLATION     microwave  . REPLACEMENT TOTAL KNEE    . THYROIDECTOMY    . TONSILLECTOMY AND ADENOIDECTOMY    . TOTAL KNEE ARTHROPLASTY  05/11/2011   Procedure: TOTAL KNEE ARTHROPLASTY;  Surgeon: Gearlean Alf;  Location: [facility];  Service: Orthopedics;  Laterality: Right;  . US ECHOCARDIOGRAPHY  11/10/2006   EF 55-60%  . US ECHOCARDIOGRAPHY  09/20/2002   EF 65-70%    reports that he has never smoked. He has never used smokeless tobacco. He reports current alcohol use of about 7.0 standard drinks of alcohol per week. He reports that he does not use drugs. family history includes Arthritis in his mother; Diabetes in his brother and mother; Hypertension in his brother, father, and mother; Kidney disease in his father; Stroke in his father. No Known Allergies Current  Outpatient Medications on File Prior to Visit  Medication Sig Dispense Refill  . amLODipine (NORVASC) 10 MG tablet Take 1 tablet (10 mg total) by mouth every morning. 90 tablet 3  . aspirin 81 MG tablet Take 81 mg by mouth daily.    . Bevacizumab (AVASTIN IV) Place 1 drop into the right eye as directed. Every 6-8 weeks    . clobetasol cream (TEMOVATE) 4.01 % Apply 1 application topically daily as needed (for contact dermatitis). Contact dermatitis  3  . clopidogrel (PLAVIX) 75 MG tablet Take 1 tablet (75 mg total) by mouth daily. 90 tablet 3  . famotidine (PEPCID) 20 MG tablet Take 1 tablet (20 mg total) by mouth 2  (two) times daily. 180 tablet 3  . FLUoxetine (PROZAC) 10 MG capsule Take 1 capsule (10 mg total) by mouth daily. 90 capsule 3  . glucose blood (ONE TOUCH ULTRA TEST) test strip Use as directed once daily to check blood sugar.  Diagnosis code E11.42 100 each 11  . latanoprost (XALATAN) 0.005 % ophthalmic solution Place 1 drop into both eyes at bedtime.     Marland Kitchen levothyroxine (SYNTHROID) 125 MCG tablet Take 1 tablet (125 mcg total) by mouth daily before breakfast. 90 tablet 3  . metFORMIN (GLUCOPHAGE) 1000 MG tablet Take 1 tablet (1,000 mg total) by mouth daily. 90 tablet 3  . metoprolol succinate (TOPROL-XL) 25 MG 24 hr tablet Take 1 tablet (25 mg total) by mouth every morning. 90 tablet 3  . Multiple Vitamins-Minerals (ICAPS AREDS 2 PO) Take 2 capsules by mouth daily.    Marland Kitchen tiZANidine (ZANAFLEX) 4 MG tablet Take 1 tablet (4 mg total) by mouth every 6 (six) hours as needed for muscle spasms. 360 tablet 3   No current facility-administered medications on file prior to visit.   Review of Systems All otherwise neg per pt    Objective:   Physical Exam BP 130/80 (BP Location: Left Arm, Patient Position: Sitting, Cuff Size: Large)   Pulse 66   Temp 98.4 F (36.9 C) (Oral)   Ht 6\' 1"  (1.854 m)   Wt 178 lb (80.7 kg)   SpO2 98%   BMI 23.48 kg/m  VS noted,  Constitutional: Pt appears in NAD HENT: Head: NCAT.  Right Ear: External ear normal.  Left Ear: External ear normal.  Eyes: . Pupils are equal, round, and reactive to light. Conjunctivae and EOM are normal Nose: without d/c or deformity Neck: Neck supple. Gross normal ROM Cardiovascular: Normal rate and regular rhythm.   Pulmonary/Chest: Effort normal and breath sounds without rales or wheezing.  Abd:  Soft, NT, ND, + BS, no organomegaly Spine with diffuse lower tender midline Neurological: Pt is alert. At baseline orientation, motor grossly intact Skin: Skin is warm. No rashes, other new lesions, no LE edema Psychiatric: Pt behavior is  normal without agitation  All otherwise neg per pt Lab Results  Component Value Date   WBC 6.5 10/03/2019   HGB 14.8 10/03/2019   HCT 43.1 10/03/2019   PLT 243.0 10/03/2019   GLUCOSE 118 (H) 10/03/2019   CHOL 193 10/03/2019   TRIG 75.0 10/03/2019   HDL 68.20 10/03/2019   LDLCALC 110 (H) 10/03/2019   ALT 11 10/03/2019   AST 17 10/03/2019   NA 138 10/03/2019   K 4.5 10/03/2019   CL 103 10/03/2019   CREATININE 1.17 10/03/2019   BUN 16 10/03/2019   CO2 28 10/03/2019   TSH 1.18 10/03/2019   INR 2.16 (H) 05/15/2011  HGBA1C 6.5 10/03/2019   MICROALBUR <0.7 10/03/2019       Assessment & Plan:

## 2019-11-26 NOTE — Assessment & Plan Note (Signed)
Also for neurology referral

## 2019-11-26 NOTE — Assessment & Plan Note (Signed)
stable overall by history and exam, recent data reviewed with pt, and pt to continue medical treatment as before,  to f/u any worsening symptoms or concerns  

## 2019-11-26 NOTE — Assessment & Plan Note (Addendum)
Acute on chronic with recent fall - for plain film  Today  I spent 41 minutes in preparing to see the patient by review of recent labs, imaging and procedures, obtaining and reviewing separately obtained history, communicating with the patient and family or caregiver, ordering medications, tests or procedures, and documenting clinical information in the EHR including the differential Dx, treatment, and any further evaluation and other management of acute low back pain, bilateral hip pain, depression, dm, gait d/o, general weakness, hallucination, hld, recurrent falls, tremor

## 2019-11-26 NOTE — Assessment & Plan Note (Signed)
No obvious reason,  to f/u any worsening symptoms or concerns

## 2019-11-26 NOTE — Assessment & Plan Note (Signed)
?   djd - for film,  to f/u any worsening symptoms or concerns

## 2019-11-26 NOTE — Assessment & Plan Note (Signed)
?   Essential vs other

## 2019-11-26 NOTE — Assessment & Plan Note (Signed)
[place] for psychiatry referral

## 2019-11-26 NOTE — Assessment & Plan Note (Signed)
Mild worsening, for cont tx, refer psychiatry

## 2019-11-28 ENCOUNTER — Ambulatory Visit
Admission: RE | Admit: 2019-11-28 | Discharge: 2019-11-28 | Disposition: A | Discharge status: Home / Self Care | Payer: Medicare Other | Source: Ambulatory Visit | Attending: Internal Medicine | Admitting: Internal Medicine

## 2019-11-28 ENCOUNTER — Other Ambulatory Visit: Payer: Self-pay

## 2019-11-28 ENCOUNTER — Other Ambulatory Visit: Payer: Medicare Other

## 2019-11-28 ENCOUNTER — Ambulatory Visit: Payer: Medicare Other | Admitting: Podiatry

## 2019-11-28 DIAGNOSIS — S32020D Wedge compression fracture of second lumbar vertebra, subsequent encounter for fracture with routine healing: Secondary | ICD-10-CM

## 2019-11-28 DIAGNOSIS — M545 Low back pain: Secondary | ICD-10-CM | POA: Diagnosis not present

## 2019-11-29 NOTE — Progress Notes (Signed)
HPI: Follow-up hypertension and palpitations. Also history of TIA. Nuclear study March 2017 showed ejection fraction 62% and no ischemia.Carotid Dopplers May 2020 showed 1 to 39% bilateral stenosis. Question episodes of syncope at last ov. Echo 4/21 showed normal LV function; grade 2 DD; trace AI and mild AS with mean gradient 12 mmHg. Monitor 4/21 showed sinus with PACs, PVCs brief PAT but no pauses. Since last seen,  there is no dyspnea, chest pain or syncope.  He fell recently and injured his back.  He is also having problems with balance and tremors.  He would like assistance with being evaluated by neurology.  Current Outpatient Medications  Medication Sig Dispense Refill  . amLODipine (NORVASC) 10 MG tablet Take 1 tablet (10 mg total) by mouth every morning. 90 tablet 3  . aspirin 81 MG tablet Take 81 mg by mouth daily.    . Bevacizumab (AVASTIN IV) Place 1 drop into the right eye as directed. Every 6-8 weeks    . clobetasol cream (TEMOVATE) 5.64 % Apply 1 application topically daily as needed (for contact dermatitis). Contact dermatitis  3  . clopidogrel (PLAVIX) 75 MG tablet Take 1 tablet (75 mg total) by mouth daily. 90 tablet 3  . famotidine (PEPCID) 20 MG tablet Take 1 tablet (20 mg total) by mouth 2 (two) times daily. 180 tablet 3  . FLUoxetine (PROZAC) 10 MG capsule Take 1 capsule (10 mg total) by mouth daily. 90 capsule 3  . glucose blood (ONE TOUCH ULTRA TEST) test strip Use as directed once daily to check blood sugar.  Diagnosis code E11.42 100 each 11  . latanoprost (XALATAN) 0.005 % ophthalmic solution Place 1 drop into both eyes at bedtime.     Marland Kitchen levothyroxine (SYNTHROID) 125 MCG tablet Take 1 tablet (125 mcg total) by mouth daily before breakfast. 90 tablet 3  . metFORMIN (GLUCOPHAGE) 1000 MG tablet Take 1 tablet (1,000 mg total) by mouth daily. 90 tablet 3  . metoprolol succinate (TOPROL-XL) 25 MG 24 hr tablet Take 1 tablet (25 mg total) by mouth every morning. 90  tablet 3  . Multiple Vitamins-Minerals (ICAPS AREDS 2 PO) Take 2 capsules by mouth daily.    Marland Kitchen tiZANidine (ZANAFLEX) 4 MG tablet Take 1 tablet (4 mg total) by mouth every 6 (six) hours as needed for muscle spasms. 360 tablet 3  . traMADol (ULTRAM) 50 MG tablet Take 1 tablet (50 mg total) by mouth every 6 (six) hours as needed. 30 tablet 0   No current facility-administered medications for this visit.     Past Medical History:  Diagnosis Date  . Acute blood loss anemia 05/15/2011   post surgical  . Altered mental status 05/15/2011  . Anxiety   . B12 deficiency 09/23/2016  . Blood transfusion without reported diagnosis   . Cancer ([place])    basal and squamous skin carcinoma   . Cataract   . Chronic kidney disease    hx of uti due to catheter hx of prostatitis, BPH -   . Depression   . Diabetes mellitus   . GERD (gastroesophageal reflux disease)   . Glaucoma 01/04/2013  . Heart murmur   . History of nuclear stress test    Myoview 3/17: EF 62%, no ischemia or scar, low risk  . Hx of transient ischemic attack (TIA) 01/04/2013  . Hx: UTI (urinary tract infection)   . Hyperlipidemia   . Hypertension   . Hypokalemia 05/15/2011  . Hypothyroidism   .  Insomnia   . Macular degeneration   . Neuropathy   . OA (osteoarthritis)   . Pneumonia   . PVC's (premature ventricular contractions)   . Renal mass   . Stroke ([place])    mid 1990s     Past Surgical History:  Procedure Laterality Date  . APPENDECTOMY    . CATARACT EXTRACTION W/ INTRAOCULAR LENS  IMPLANT, BILATERAL    . INGUINAL HERNIA REPAIR     X3  . LUMBAR LAMINECTOMY/DECOMPRESSION MICRODISCECTOMY N/A 02/12/2017   Procedure: Lumbar Three-Four, Lumbar Four-Five, Lumbar Five-Sacral One Laminectomy and Foraminotomy;  Surgeon: Earnie Larsson, MD;  Location: [facility];  Service: Neurosurgery;  Laterality: N/A;  . OTHER SURGICAL HISTORY     microwave procedure for prostate - 09/12   . PROSTATE ABLATION     microwave  . REPLACEMENT TOTAL KNEE     . THYROIDECTOMY    . TONSILLECTOMY AND ADENOIDECTOMY    . TOTAL KNEE ARTHROPLASTY  05/11/2011   Procedure: TOTAL KNEE ARTHROPLASTY;  Surgeon: Gearlean Alf;  Location: [facility];  Service: Orthopedics;  Laterality: Right;  . US ECHOCARDIOGRAPHY  11/10/2006   EF 55-60%  . US ECHOCARDIOGRAPHY  09/20/2002   EF 65-70%    Social History   Socioeconomic History  . Marital status: Married    Spouse name: Not on file  . Number of children: 3  . Years of education: Not on file  . Highest education level: Not on file  Occupational History  . Occupation: retired    Fish farm manager: RETIRED    Comment: [place] [employer]  Tobacco Use  . Smoking status: Never Smoker  . Smokeless tobacco: Never Used  Vaping Use  . Vaping Use: Never used  Substance and Sexual Activity  . Alcohol use: Yes    Alcohol/week: 7.0 standard drinks    Types: 7 Shots of liquor per week    Comment: 2 drinks most days  . Drug use: No  . Sexual activity: Never    Birth control/protection: None  Other Topics Concern  . 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 Difficulty of Paying Living Expenses:   Food Insecurity:   . Worried About Charity fundraiser in the Last Year:   . Arboriculturist in the Last Year:   Transportation Needs:   . Film/video editor (Medical):   Marland Kitchen Lack of Transportation (Non-Medical):   Physical Activity:   . Days of Exercise per Week:   . Minutes of Exercise per Session:   Stress:   . Feeling of Stress :   Social Connections:   . Frequency of Communication with Friends and Family:   . Frequency of Social Gatherings with Friends and Family:   . Attends Religious Services:   . Active Member of Clubs or Organizations:   .  Attends Archivist Meetings:   Marland Kitchen Marital Status:   Intimate Partner Violence:   . Fear of Current or Ex-Partner:   . Emotionally Abused:   Marland Kitchen Physically Abused:   . Sexually Abused:     Family History  Problem Relation Age of Onset  .  Hypertension Mother   . Arthritis Mother   . Diabetes Mother   . Hypertension Father   . Stroke Father   . Kidney disease Father   . Hypertension Brother   . Diabetes Brother     ROS: back pain but no fevers or chills, productive cough, hemoptysis, dysphasia, odynophagia, melena, hematochezia, dysuria, hematuria, rash, seizure activity, orthopnea, PND, pedal edema, claudication. Remaining systems are negative.  Physical Exam: Well-developed well-nourished in no acute distress.  Skin is warm and dry.  HEENT is normal.  Neck is supple.  Chest is clear to auscultation with normal expansion.  Cardiovascular exam is regular rate and rhythm. 2/6 systolic murmur Abdominal exam nontender or distended. No masses palpated. Extremities show no edema. neuro grossly intact  A/P  1 Question syncope-WU negative and not clearly complete LOC in past; will not pursue further evaluation at this point.  He is describing problems with balance and tremors.  He would like assistance with evaluated by neurology which apparently was recommended by his primary care.  We will arrange.  2 hypertension-BP controlled; continue present meds and follow.  3 hperlipidemia-per primary care.  4 AS-mild on most recent echo.  5 h/o TIA-continue ASA and plavix.  6 palpitations-continue beta blocker.  Kirk Ruths, MD

## 2019-12-01 ENCOUNTER — Other Ambulatory Visit (INDEPENDENT_AMBULATORY_CARE_PROVIDER_SITE_OTHER): Payer: Self-pay | Admitting: Ophthalmology

## 2019-12-04 ENCOUNTER — Ambulatory Visit (INDEPENDENT_AMBULATORY_CARE_PROVIDER_SITE_OTHER): Payer: Medicare Other | Admitting: Ophthalmology

## 2019-12-04 ENCOUNTER — Encounter (INDEPENDENT_AMBULATORY_CARE_PROVIDER_SITE_OTHER): Payer: Self-pay | Admitting: Ophthalmology

## 2019-12-04 ENCOUNTER — Other Ambulatory Visit: Payer: Self-pay

## 2019-12-04 DIAGNOSIS — H353211 Exudative age-related macular degeneration, right eye, with active choroidal neovascularization: Secondary | ICD-10-CM

## 2019-12-04 DIAGNOSIS — Z9889 Other specified postprocedural states: Secondary | ICD-10-CM | POA: Diagnosis not present

## 2019-12-04 MED ORDER — BEVACIZUMAB CHEMO INJECTION 1.25MG/0.05ML SYRINGE FOR KALEIDOSCOPE
1.2500 mg | INTRAVITREAL | Status: AC | PRN
  Administered 2019-12-04: 1.25 mg via INTRAVITREAL

## 2019-12-04 NOTE — Assessment & Plan Note (Signed)

## 2019-12-04 NOTE — Progress Notes (Signed)
12/04/2019     CHIEF COMPLAINT Patient presents for Retina Follow Up   HISTORY OF PRESENT ILLNESS: Stephen Wilkinson is a 86 y.o. male who presents to the clinic today for:   HPI    Retina Follow Up    Patient presents with  Wet AMD.  In right eye.  Duration of 7 weeks.  Since onset it is stable.          Comments    7 week follow up - OCT OU, Poss Avastin OD Patient states that the medicine (Tramadol) he is on for his spine has started to effect his vision.        Last edited by Gerda Diss on 12/04/2019 10:31 AM. (History)      Referring physician: Biagio Borg, MD [place],  [place] [id number]  HISTORICAL INFORMATION:   Selected notes from the MEDICAL RECORD NUMBER    Lab Results  Component Value Date   HGBA1C 6.5 10/03/2019     CURRENT MEDICATIONS: Current Outpatient Medications (Ophthalmic Drugs)  Medication Sig  . latanoprost (XALATAN) 0.005 % ophthalmic solution Place 1 drop into both eyes at bedtime.    No current facility-administered medications for this visit. (Ophthalmic Drugs)   Current Outpatient Medications (Other)  Medication Sig  . amLODipine (NORVASC) 10 MG tablet Take 1 tablet (10 mg total) by mouth every morning.  Marland Kitchen aspirin 81 MG tablet Take 81 mg by mouth daily.  . Bevacizumab (AVASTIN IV) Place 1 drop into the right eye as directed. Every 6-8 weeks  . clobetasol cream (TEMOVATE) 6.04 % Apply 1 application topically daily as needed (for contact dermatitis). Contact dermatitis  . clopidogrel (PLAVIX) 75 MG tablet Take 1 tablet (75 mg total) by mouth daily.  . famotidine (PEPCID) 20 MG tablet Take 1 tablet (20 mg total) by mouth 2 (two) times daily.  Marland Kitchen FLUoxetine (PROZAC) 10 MG capsule Take 1 capsule (10 mg total) by mouth daily.  Marland Kitchen glucose blood (ONE TOUCH ULTRA TEST) test strip Use as directed once daily to check blood sugar.  Diagnosis code E11.42  . levothyroxine (SYNTHROID) 125 MCG tablet Take 1 tablet (125 mcg total) by mouth  daily before breakfast.  . metFORMIN (GLUCOPHAGE) 1000 MG tablet Take 1 tablet (1,000 mg total) by mouth daily.  . metoprolol succinate (TOPROL-XL) 25 MG 24 hr tablet Take 1 tablet (25 mg total) by mouth every morning.  . Multiple Vitamins-Minerals (ICAPS AREDS 2 PO) Take 2 capsules by mouth daily.  Marland Kitchen tiZANidine (ZANAFLEX) 4 MG tablet Take 1 tablet (4 mg total) by mouth every 6 (six) hours as needed for muscle spasms.  . traMADol (ULTRAM) 50 MG tablet Take 1 tablet (50 mg total) by mouth every 6 (six) hours as needed.   No current facility-administered medications for this visit. (Other)      REVIEW OF SYSTEMS:    ALLERGIES No Known Allergies  PAST MEDICAL HISTORY Past Medical History:  Diagnosis Date  . Acute blood loss anemia 05/15/2011   post surgical  . Altered mental status 05/15/2011  . Anxiety   . B12 deficiency 09/23/2016  . Blood transfusion without reported diagnosis   . Cancer ([place])    basal and squamous skin carcinoma   . Cataract   . Chronic kidney disease    hx of uti due to catheter hx of prostatitis, BPH -   . Depression   . Diabetes mellitus   . GERD (gastroesophageal reflux disease)   . Glaucoma  01/04/2013  . Heart murmur   . History of nuclear stress test    Myoview 3/17: EF 62%, no ischemia or scar, low risk  . Hx of transient ischemic attack (TIA) 01/04/2013  . Hx: UTI (urinary tract infection)   . Hyperlipidemia   . Hypertension   . Hypokalemia 05/15/2011  . Hypothyroidism   . Insomnia   . Macular degeneration   . Neuropathy   . OA (osteoarthritis)   . Pneumonia   . PVC's (premature ventricular contractions)   . Renal mass   . Stroke ([place])    mid 1990s    Past Surgical History:  Procedure Laterality Date  . APPENDECTOMY    . CATARACT EXTRACTION W/ INTRAOCULAR LENS  IMPLANT, BILATERAL    . INGUINAL HERNIA REPAIR     X3  . LUMBAR LAMINECTOMY/DECOMPRESSION MICRODISCECTOMY N/A 02/12/2017   Procedure: Lumbar Three-Four, Lumbar Four-Five,  Lumbar Five-Sacral One Laminectomy and Foraminotomy;  Surgeon: Earnie Larsson, MD;  Location: [name];  Service: Neurosurgery;  Laterality: N/A;  . OTHER SURGICAL HISTORY     microwave procedure for prostate - 09/12   . PROSTATE ABLATION     microwave  . REPLACEMENT TOTAL KNEE    . THYROIDECTOMY    . TONSILLECTOMY AND ADENOIDECTOMY    . TOTAL KNEE ARTHROPLASTY  05/11/2011   Procedure: TOTAL KNEE ARTHROPLASTY;  Surgeon: Gearlean Alf;  Location: [facility];  Service: Orthopedics;  Laterality: Right;  . US ECHOCARDIOGRAPHY  11/10/2006   EF 55-60%  . US ECHOCARDIOGRAPHY  09/20/2002   EF 65-70%    FAMILY HISTORY Family History  Problem Relation Age of Onset  . Hypertension Mother   . Arthritis Mother   . Diabetes Mother   . Hypertension Father   . Stroke Father   . Kidney disease Father   . Hypertension Brother   . Diabetes Brother     SOCIAL HISTORY Social History   Tobacco Use  . Smoking status: Never Smoker  . Smokeless tobacco: Never Used  Vaping Use  . Vaping Use: Never used  Substance Use Topics  . Alcohol use: Yes    Alcohol/week: 7.0 standard drinks    Types: 7 Shots of liquor per week    Comment: 2 drinks most days  . Drug use: No         OPHTHALMIC EXAM:  Base Eye Exam    Visual Acuity (Snellen - Linear)      Right Left   Dist [place] 20/30-1 CF @ 3'   Correction: Glasses       Tonometry (Tonopen, 10:35 AM)      Right Left   Pressure 15 15       Pupils      Pupils Dark Light Shape React APD   Right PERRL 3 2 Round Brisk None   Left PERRL 3 2 Round Brisk None       Visual Fields (Counting fingers)      Left Right     Full   Restrictions Total superior temporal deficiency        Extraocular Movement      Right Left    Full Full       Neuro/Psych    Oriented x3: Yes   Mood/Affect: Normal       Dilation    Right eye: 1.0% Mydriacyl, 2.5% Phenylephrine @ 10:35 AM        Slit Lamp and Fundus Exam    External Exam      Right  Left   External  Normal Normal       Slit Lamp Exam      Right Left   Lids/Lashes Normal Normal   Conjunctiva/Sclera White and quiet White and quiet   Cornea Clear Clear   Anterior Chamber Deep and quiet Deep and quiet   Iris Round and reactive Round and reactive   Lens Posterior chamber intraocular lens Posterior chamber intraocular lens   Vitreous Normal Normal          IMAGING AND PROCEDURES  Imaging and Procedures for 12/04/19  OCT, Retina - OU - Both Eyes       Right Eye Quality was good. Scan locations included subfoveal. Central Foveal Thickness: 247. Progression has been stable. Findings include abnormal foveal contour, central retinal atrophy, outer retinal atrophy, subretinal scarring.   Left Eye Quality was good. Scan locations included subfoveal. Central Foveal Thickness: 232. Findings include central retinal atrophy, preretinal fibrosis, intraretinal hyper-reflective material, subretinal hyper-reflective material, subretinal scarring.   Notes The nature of wet macular degeneration was discussed with the patient.  Forms of therapy reviewed include the use of Anti-VEGF medications injected painlessly into the eye, as well as other possible treatment modalities, including thermal laser therapy. Fellow eye involvement and risks were discussed with the patient. Upon the finding of wet age related macular degeneration, treatment will be offered. The treatment regimen is on a treat as needed basis with the intent to treat if necessary and extend interval of exams when possible. On average 1 out of 6 patients do not need lifetime therapy. However, the risk of recurrent disease is high for a lifetime.  Initially monthly, then periodic, examinations and evaluations will determine whether the next treatment is required on the day of the examination.OD with chronic recurrent CNVM and CME in the past, with stable functioning and anatomy at 7-week exam interval.  Repeat intravitreal Avastin OD  today.  OS, with history of severe vitreal macular traction syndrome and epiretinal membrane.  Now resolved and foveal atrophy with no CNVM active       Intravitreal Injection, Pharmacologic Agent - OD - Right Eye       Time Out 12/04/2019. 11:08 AM. Confirmed correct patient, procedure, site, and patient consented.   Anesthesia Topical anesthesia was used. Anesthetic medications included [place] 3.5%.   Procedure Preparation included Ofloxacin , 10% betadine to eyelids. A 30 gauge needle was used.   Injection:  1.25 mg Bevacizumab (AVASTIN) SOLN   NDC: [address]-3, Lot: [id number]   Route: Intravitreal, Site: Right Eye, Waste: 0 mg  Post-op Post injection exam found visual acuity of at least counting fingers. The patient tolerated the procedure well. There were no complications. The patient received written and verbal post procedure care education. Post injection medications were not given.                 ASSESSMENT/PLAN:  Exudative age-related macular degeneration of right eye with active choroidal neovascularization (HCC) The nature of wet macular degeneration was discussed with the patient.  Forms of therapy reviewed include the use of Anti-VEGF medications injected painlessly into the eye, as well as other possible treatment modalities, including thermal laser therapy. Fellow eye involvement and risks were discussed with the patient. Upon the finding of wet age related macular degeneration, treatment will be offered. The treatment regimen is on a treat as needed basis with the intent to treat if necessary and extend interval of exams when possible. On average 1 out of 6 patients do  not need lifetime therapy. However, the risk of recurrent disease is high for a lifetime.  Initially monthly, then periodic, examinations and evaluations will determine whether the next treatment is required on the day of the examination.      ICD-10-CM   1. Exudative age-related macular  degeneration of right eye with active choroidal neovascularization (HCC)  H35.3211 OCT, Retina - OU - Both Eyes    Intravitreal Injection, Pharmacologic Agent - OD - Right Eye    Bevacizumab (AVASTIN) SOLN 1.25 mg  2. History of vitrectomy  Z98.890     1.  2.  3.  Ophthalmic Meds Ordered this visit:  Meds ordered this encounter  Medications  . Bevacizumab (AVASTIN) SOLN 1.25 mg       Return in about 7 weeks (around 01/22/2020) for DILATE OU, AVASTIN OCT, OD.  There are no Patient Instructions on file for this visit.   Explained the diagnoses, plan, and follow up with the patient and they expressed understanding.  Patient expressed understanding of the importance of proper follow up care.   Clent Demark Vana Arif M.D. Diseases & Surgery of the Retina and Vitreous [facility] [place] 12/04/19     Abbreviations: M myopia (nearsighted); A astigmatism; H hyperopia (farsighted); P presbyopia; Mrx spectacle prescription;  CTL contact lenses; OD right eye; OS left eye; OU both eyes  XT exotropia; ET esotropia; PEK punctate epithelial keratitis; PEE punctate epithelial erosions; DES dry eye syndrome; MGD meibomian gland dysfunction; ATs artificial tears; PFAT's preservative free artificial tears; [place] nuclear sclerotic cataract; PSC posterior subcapsular cataract; ERM epi-retinal membrane; PVD posterior vitreous detachment; RD retinal detachment; DM diabetes mellitus; DR diabetic retinopathy; NPDR non-proliferative diabetic retinopathy; PDR proliferative diabetic retinopathy; CSME clinically significant macular edema; DME diabetic macular edema; dbh dot blot hemorrhages; CWS cotton wool spot; POAG primary open angle glaucoma; C/D cup-to-disc ratio; HVF humphrey visual field; GVF goldmann visual field; OCT optical coherence tomography; IOP intraocular pressure; BRVO Branch retinal vein occlusion; CRVO central retinal vein occlusion; CRAO central retinal artery occlusion; BRAO branch retinal  artery occlusion; RT retinal tear; SB scleral buckle; PPV pars plana vitrectomy; VH Vitreous hemorrhage; PRP panretinal laser photocoagulation; IVK intravitreal kenalog; VMT vitreomacular traction; MH Macular hole;  NVD neovascularization of the disc; NVE neovascularization elsewhere; AREDS age related eye disease study; ARMD age related macular degeneration; POAG primary open angle glaucoma; EBMD epithelial/anterior basement membrane dystrophy; ACIOL anterior chamber intraocular lens; IOL intraocular lens; PCIOL posterior chamber intraocular lens; Phaco/IOL phacoemulsification with intraocular lens placement; [place] photorefractive keratectomy; LASIK laser assisted in situ keratomileusis; HTN hypertension; DM diabetes mellitus; COPD chronic obstructive pulmonary disease

## 2019-12-05 ENCOUNTER — Ambulatory Visit (INDEPENDENT_AMBULATORY_CARE_PROVIDER_SITE_OTHER): Payer: Medicare Other | Admitting: Cardiology

## 2019-12-05 ENCOUNTER — Encounter: Payer: Self-pay | Admitting: Cardiology

## 2019-12-05 VITALS — BP 132/78 | HR 81 | Temp 97.9°F | Ht 73.0 in | Wt 175.8 lb

## 2019-12-05 DIAGNOSIS — I119 Hypertensive heart disease without heart failure: Secondary | ICD-10-CM

## 2019-12-05 DIAGNOSIS — R55 Syncope and collapse: Secondary | ICD-10-CM | POA: Diagnosis not present

## 2019-12-05 DIAGNOSIS — E7800 Pure hypercholesterolemia, unspecified: Secondary | ICD-10-CM | POA: Diagnosis not present

## 2019-12-05 DIAGNOSIS — I350 Nonrheumatic aortic (valve) stenosis: Secondary | ICD-10-CM | POA: Diagnosis not present

## 2019-12-05 DIAGNOSIS — R002 Palpitations: Secondary | ICD-10-CM

## 2019-12-05 NOTE — Patient Instructions (Signed)
Medication Instructions:  NO CHANGE *If you need a refill on your cardiac medications before your next appointment, please call your pharmacy*   Lab Work: If you have labs (blood work) drawn today and your tests are completely normal, you will receive your results only by: Marland Kitchen MyChart Message (if you have MyChart) OR . A paper copy in the mail If you have any lab test that is abnormal or we need to change your treatment, we will call you to review the results.   Follow-Up: At [facility], you and your health needs are our priority.  As part of our continuing mission to provide you with exceptional heart care, we have created designated Provider Care Teams.  These Care Teams include your primary Cardiologist (physician) and Advanced Practice Providers (APPs -  Physician Assistants and Nurse Practitioners) who all work together to provide you with the care you need, when you need it.  We recommend signing up for the patient portal called "MyChart".  Sign up information is provided on this After Visit Summary.  MyChart is used to connect with patients for Virtual Visits (Telemedicine).  Patients are able to view lab/test results, encounter notes, upcoming appointments, etc.  Non-urgent messages can be sent to your provider as well.   To learn more about what you can do with MyChart, go to [email or web address].    Your next appointment:   6 month(s)  The format for your next appointment:   Either In Person or Virtual  Provider: Kirk Ruths MD

## 2019-12-06 ENCOUNTER — Ambulatory Visit (INDEPENDENT_AMBULATORY_CARE_PROVIDER_SITE_OTHER): Payer: Medicare Other | Admitting: Neurology

## 2019-12-06 ENCOUNTER — Encounter: Payer: Self-pay | Admitting: Neurology

## 2019-12-06 ENCOUNTER — Other Ambulatory Visit: Payer: Self-pay

## 2019-12-06 VITALS — BP 143/73 | HR 70 | Ht 73.0 in | Wt 175.5 lb

## 2019-12-06 DIAGNOSIS — R296 Repeated falls: Secondary | ICD-10-CM

## 2019-12-06 DIAGNOSIS — R2689 Other abnormalities of gait and mobility: Secondary | ICD-10-CM | POA: Diagnosis not present

## 2019-12-06 NOTE — Progress Notes (Signed)
Subjective:    Patient ID: Stephen Wilkinson is a 86 y.o. male.  HPI     Star Age, MD, PhD [facility] [address], [address] [place], [place] [address]  Dear Dr. Jenny Reichmann,   I saw your patient, Stephen Wilkinson, upon your kind request some neurologic clinic today for initial consultation of his gait disorder, and recurrent falls.  The patient is unaccompanied today.  As you know, Mr. Bones is a 86 year old right-handed gentleman with an underlying complex medical history of stroke, osteoarthritis, macular degeneration, hypertension, hyperlipidemia, hypothyroidism, reflux disease, diabetes, depression, anxiety, B12 deficiency, PVCs, history of renal mass, stroke, BPH, and chronic kidney disease, who reports problems with his balance and gait and recurrent falls for the past year. Symptoms started gradually and have been progressive. He has been using a 2 wheeled walker. He had a recent fall and injured his lower back and had significant pain, the pain is now a little better, he no longer takes tramadol. He has a history of neuropathy for years. He had seen a podiatrist in the past, he has numbness in both feet, up to the ankles. His wife is worried about his cognitive decline, she also reports that he is irritable. He has anger outbursts. His wife is quiet during the appointment and does not contribute a whole lot of information but does add some specifics when requested. He admits that he does not hydrate well with water, that he could do better. His wife estimates that he drinks about 4 ounces of water per day. She also reports that he drinks alcohol daily. He denies drinking daily. She reports that he drinks vodka daily. He reports that it is milder and that he does not drink every day, maybe every other day. He has been on Zanaflex for years, typically takes 4 mg at bedtime for muscle spasms and pain. He has no family history of Parkinson's disease. He reports a family history  of neuropathy affecting his mother and grandmother. He does have some cognitive symptoms including difficulty with calculations. He used to be very good with numbers.  I reviewed your office note from 11/23/2019.  He had a recent lumbar spine x-ray after a fall.  He was found to have a L2 compression fracture.  He had a recent lumbar spine MRI without contrast on 11/28/2019 and I reviewed the results: IMPRESSION: Acute or subacute superior endplate compression fracture of L2 with vertebral body height loss of up to 50% has an appearance most consistent with a senile osteoporotic or posttraumatic injury. No bony retropulsion.   Status post posterior decompression from L2-S1. The central canal is open at each level. Moderate to moderately severe bilateral foraminal narrowing at L2-3 and L3-4 is worse on the left at both levels. Mild to moderate bilateral foraminal narrowing is present at L4-5 and L5-S1.  He had a brain MRI without contrast on 11/02/2018 and I reviewed the results: IMPRESSION: Advanced atrophy. Extensive small vessel disease. No acute intracranial findings.   Remote microbleeds suggesting hypertensive cerebrovascular disease as an etiology.  Vit B12 was low on 10/03/19 at 179. He was advised to restart oral B12 supplementation. He has seen cardiology recently for syncopal spell/concerns.  His Past Medical History Is Significant For: Past Medical History:  Diagnosis Date  . Acute blood loss anemia 05/15/2011   post surgical  . Altered mental status 05/15/2011  . Anxiety   . B12 deficiency 09/23/2016  . Blood transfusion without reported diagnosis   .  Cancer ([place])    basal and squamous skin carcinoma   . Cataract   . Chronic kidney disease    hx of uti due to catheter hx of prostatitis, BPH -   . Depression   . Diabetes mellitus   . GERD (gastroesophageal reflux disease)   . Glaucoma 01/04/2013  . Heart murmur   . History of nuclear stress test    Myoview 3/17: EF 62%,  no ischemia or scar, low risk  . Hx of transient ischemic attack (TIA) 01/04/2013  . Hx: UTI (urinary tract infection)   . Hyperlipidemia   . Hypertension   . Hypokalemia 05/15/2011  . Hypothyroidism   . Insomnia   . Macular degeneration   . Neuropathy   . OA (osteoarthritis)   . Pneumonia   . PVC's (premature ventricular contractions)   . Renal mass   . Stroke [facility])    mid 1990s     His Past Surgical History Is Significant For: Past Surgical History:  Procedure Laterality Date  . APPENDECTOMY    . CATARACT EXTRACTION W/ INTRAOCULAR LENS  IMPLANT, BILATERAL    . INGUINAL HERNIA REPAIR     X3  . LUMBAR LAMINECTOMY/DECOMPRESSION MICRODISCECTOMY N/A 02/12/2017   Procedure: Lumbar Three-Four, Lumbar Four-Five, Lumbar Five-Sacral One Laminectomy and Foraminotomy;  Surgeon: Earnie Larsson, MD;  Location: [facility];  Service: Neurosurgery;  Laterality: N/A;  . OTHER SURGICAL HISTORY     microwave procedure for prostate - 09/12   . PROSTATE ABLATION     microwave  . REPLACEMENT TOTAL KNEE    . THYROIDECTOMY    . TONSILLECTOMY AND ADENOIDECTOMY    . TOTAL KNEE ARTHROPLASTY  05/11/2011   Procedure: TOTAL KNEE ARTHROPLASTY;  Surgeon: Gearlean Alf;  Location: [facility];  Service: Orthopedics;  Laterality: Right;  . US ECHOCARDIOGRAPHY  11/10/2006   EF 55-60%  . US ECHOCARDIOGRAPHY  09/20/2002   EF 65-70%    His Family History Is Significant For: Family History  Problem Relation Age of Onset  . Hypertension Mother   . Arthritis Mother   . Diabetes Mother   . Hypertension Father   . Stroke Father   . Kidney disease Father   . Hypertension Brother   . Diabetes Brother     His Social History Is Significant For: Social History   Socioeconomic History  . Marital status: Married    Spouse name: Not on file  . Number of children: 3  . Years of education: Not on file  . Highest education level: Not on file  Occupational History  . Occupation: retired    Fish farm manager: RETIRED    Comment:  [employer]  Tobacco Use  . Smoking status: Never Smoker  . Smokeless tobacco: Never Used  Vaping Use  . Vaping Use: Never used  Substance and Sexual Activity  . Alcohol use: Yes    Alcohol/week: 7.0 standard drinks    Types: 7 Shots of liquor per week    Comment: 2 drinks most days  . Drug use: No  . Sexual activity: Never    Birth control/protection: None  Other Topics Concern  . 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 Difficulty of Paying Living Expenses:   Food Insecurity:   . Worried About Charity fundraiser in the Last Year:   . Arboriculturist in the Last Year:   Transportation Needs:   . Film/video editor (Medical):   Marland Kitchen Lack of Transportation (Non-Medical):   Physical Activity:   . Days of Exercise per Week:   . Minutes of Exercise per Session:   Stress:   . Feeling of Stress :   Social Connections:   . Frequency of Communication with Friends and Family:   . Frequency of Social Gatherings with Friends and Family:   . Attends Religious Services:   . Active Member of Clubs or Organizations:   . Attends Archivist Meetings:   Marland Kitchen Marital Status:     His Allergies Are:  No Known Allergies:   His Current Medications Are:  Outpatient Encounter Medications as of 12/06/2019  Medication Sig  . amLODipine (NORVASC) 10 MG tablet Take 1 tablet (10 mg total) by mouth every morning.  Marland Kitchen aspirin 81 MG tablet Take 81 mg by mouth daily.  . Bevacizumab (AVASTIN IV) Place 1 drop into the right eye as directed. Every 6-8 weeks  . clobetasol cream (TEMOVATE) 7.51 % Apply 1 application topically daily as needed (for contact dermatitis). Contact dermatitis  . clopidogrel (PLAVIX) 75 MG tablet Take  1 tablet (75 mg total) by mouth daily.  . famotidine (PEPCID) 20 MG tablet Take 1 tablet (20 mg total) by mouth 2 (two) times daily.  Marland Kitchen FLUoxetine (PROZAC) 10 MG capsule Take 1 capsule (10 mg total) by mouth daily.  Marland Kitchen glucose blood (ONE TOUCH ULTRA TEST) test strip Use as directed once daily to check blood sugar.  Diagnosis code E11.42  . latanoprost (XALATAN) 0.005 % ophthalmic solution INSTILL 1 DROP INTO BOTH EYES AT BEDTIME  . levothyroxine (SYNTHROID) 125 MCG tablet Take 1 tablet (125 mcg total) by mouth daily before breakfast.  . metFORMIN (GLUCOPHAGE) 1000 MG tablet Take 1 tablet (1,000 mg total) by mouth daily.  . metoprolol succinate (TOPROL-XL) 25 MG 24 hr tablet Take 1 tablet (25 mg total) by mouth every morning.  . Multiple Vitamins-Minerals (ICAPS AREDS 2 PO) Take 2 capsules by mouth daily.  Marland Kitchen tiZANidine (ZANAFLEX) 4 MG tablet Take 1 tablet (4 mg total) by mouth every 6 (six) hours as needed for muscle spasms.  . [DISCONTINUED] traMADol (ULTRAM) 50 MG tablet Take 1 tablet (50 mg total) by mouth every 6 (six) hours as needed.   No facility-administered encounter medications on file as of 12/06/2019.  :   Review of Systems:  Out of a complete 14 point review of systems, all are reviewed and negative with the exception of these symptoms as listed below:  Review of Systems  Neurological:       Referred by PCP Dr. Cathlean Cower for unsteady gait and tremors. Per patient he has been falling a lot in the past 6 months. Last fall was last month and he ended up with a spine injury. Has been told that he does not dementia.   Has fallen at least 5-6 times in the past 6 months.     Objective:  Neurological Exam  Physical Exam Physical Examination:   Vitals:   12/06/19 1111  BP: (!) 143/73  Pulse: 70    General Examination: The patient is a very pleasant 86 y.o. male in no acute distress.  He appears well-developed and well-nourished and well groomed.   HEENT: Normocephalic,  atraumatic, pupils are equal, round and reactive to light and accommodation. 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 He has mild hearing impairment. Face is symmetric with normal facial animation, neck is supple. Speech is clear without dysarthria. He has no lip, neck or jaw tremor. Airway examination reveals mild to moderate mouth dryness, tongue protrudes centrally and palate elevates symmetrically.   Chest: Clear to auscultation without wheezing, rhonchi or crackles noted.  Heart: S1+S2+0, regular and normal without murmurs, rubs or gallops noted.   Abdomen: Soft, non-tender and non-distended with normal bowel sounds appreciated on auscultation.  Extremities: There is no pitting edema in the distal lower extremities bilaterally. Pedal pulses are intact.  Skin: Warm and dry without trophic changes noted.   Musculoskeletal: exam reveals no obvious joint deformities, tenderness or joint swelling or erythema.   Neurologically:  Mental status: The patient is awake, alert and oriented in all 4 spheres. His immediate and remote memory, attention, language skills and fund of knowledge are fairly appropriate to mildly impaired, no obvious dysarthria, anomia, or apraxia.  Mood is normal, affect appears to be normal.  Cranial nerves II - XII are as described above under HEENT exam. In addition: shoulder shrug is normal with equal shoulder height noted. Motor exam: Normal bulk, strength and tone is noted. There is no drift, tremor or rebound. Reflexes are 1+ in the UEs and absent in the LEs, toes are flexor bilaterally. Fine motor skills and coordination: Mildly impaired globally. No lateralization, no resting tremor, minimal postural tremor in both upper extremities. On Archimedes spiral drawing he has coarse insecurity with his nondominant hand and mild insecurity with the dominant hand, handwriting is  legible, not micrographic, not particularly neat.  Cerebellar testing: No dysmetria or intention tremor on finger to nose testing. Heel to shin is unremarkable bilaterally. Sensory exam: intact to light touch.  Gait, station and balance: He stands with mild difficulty and uses the armrest of the chair to push-up. He has a 2 wheeled walker, maneuvers to walker well, no obvious shuffling, he just walks slightly slowly and cautiously, turns slowly, no freezing spells. I did not ask him to walk without the walker for safety reasons.  Romberg is not tested for safety reasons.   Assessment and Plan:    Assessment and Plan:  In summary, Gionni Vaca is a very pleasant 86 y.o.-year old male with an underlying complex medical history of stroke, osteoarthritis, macular degeneration, hypertension, hyperlipidemia, hypothyroidism, reflux disease, diabetes, depression, anxiety, B12 deficiency, PVCs, history of renal mass, stroke, BPH, and chronic kidney disease, who presents for evaluation of his gait and balance problem, recurrent falls and history of tremors. He has an approximately 1 year history of progressive issues with his gait and recurrent falls. He injured his spine recently, sustained a L2 compression fracture. He is waiting to see neurosurgery. He has a longer standing history of degenerative spine disease and recent lumbar spine MRI confirms multilevel degenerative changes and postsurgical changes as well. He also has a longer standing history of neuropathy. He has B12 deficiency. All of these are contributors, most likely his gait disorder and balance problems are multifactorial in nature, secondary to a number of factors including neuropathy, degenerative spine disease, atherosclerotic changes in the brain, normal imaging, suboptimal hydration, daily or nearly daily alcohol consumption and recurrent  falls. I had a long discussion with the patient and his wife. She is worried about his mood related changes and  cognitive changes. She is advised that he does certainly have risk factors for vascular dementia and he has chronic changes on his brain MRI. He is advised to increase his water intake to 6 to 8 cups/day if possible daily, 8 ounce each. He is advised to refrain from drinking alcohol daily. In fact, if possible, I would recommend that he abstain from alcohol completely. He is advised to use his walker at all times for gait safety and we talked about the importance of fall prevention. Supportive measures will be of importance. He is advised to talk to you about his mood related issues including irritability. Of note, he has stopped taking Prozac several months ago. He may benefit from physical therapy, he is advised to see neurosurgery, I can see where a referral was placed earlier this month. From my end of things I did not suggest any new medications or diagnostic testing. I did not detect any signs of parkinsonism and he is reassured in that regard. I would be happy to see him back as needed. I answered all their questions today and the patient and his wife were in agreement. This was an extended visit of over 1 hour.  Thank you very much for allowing me to participate in the care of this nice patient. If I can be of any further assistance to you please do not hesitate to call me at [phone].  Sincerely,   Star Age, MD, PhD

## 2019-12-06 NOTE — Patient Instructions (Signed)
I believe you have a multifactorial gait disorder, meaning, that it is Stephen Wilkinson is due to a combination of factors. These factors include: normal aging, degenerative arthritis of your back, atherosclerosis of the blood vessels in your brain, prior falls, neuropathy/nerve damage, suboptimal water intake, nearly daily alcohol consumption.  Please remember to stand up slowly and get your bearings first turn slowly, no bending down to pick anything, no heavy lifting, be extra careful at night and first thing in the morning. Also, be careful in the Bathroom and the kitchen.   Please use your walker at all times.  You may benefit from physical therapy but I would await input from the neurosurgeon.  As I can see, Dr. Jenny Reichmann made a referral to neurosurgery.  Please call your primary care physician's office to get an update on the referral to neurosurgery.  As discussed, please increase your water intake, 6 to 8 cups of water per day are recommended, 8 ounce each.  Please try to rest well at night.  Be cautious with medication that can be sedating including tizanidine/Zanaflex.  Please consider eliminating alcohol altogether.  Certainly, I would not recommend daily or nearly daily consumption of alcohol in the case.    I do not see any signs of Parkinson's-like changes.  I do not believe you need any additional test from my end of things.  Please try to exercise on a regular basis.  You may consider using a recumbent stationary bike instead of walking for now at least.  As far as your medications are concerned, I would like to suggest no new medications.   Please notify Dr. Jenny Reichmann that you have stopped taking the Prozac. Please talk to your primary care physician about mood related symptoms including irritability as your wife mentioned this is a concern.  I would be happy to see you back as needed.

## 2019-12-14 ENCOUNTER — Telehealth: Payer: Self-pay | Admitting: Internal Medicine

## 2019-12-14 NOTE — Telephone Encounter (Signed)
New message:   Pt is calling and needs some assistance with where his referral was sent for his back. I told the pt but he states there are two places just alike. He states they [place]'t have the MRI's to schedule him for an appt. Pt states he needs his referral sent to a Dr. Annette Stable and they want the MRI results sent to him and they stated they will see him. Please advise  [facility] [phone] Dr. Annette Stable [address], [place],  [address]

## 2019-12-15 NOTE — Telephone Encounter (Signed)
This patient has already had MRI 11/28/19 and scheduled to see Dr Annette Stable 01/03/20  @ 1245pm

## 2020-01-01 ENCOUNTER — Ambulatory Visit (INDEPENDENT_AMBULATORY_CARE_PROVIDER_SITE_OTHER): Payer: Medicare Other | Admitting: Podiatrist

## 2020-01-01 ENCOUNTER — Encounter: Payer: Self-pay | Admitting: Podiatrist

## 2020-01-01 ENCOUNTER — Other Ambulatory Visit: Payer: Self-pay

## 2020-01-01 DIAGNOSIS — E1140 Type 2 diabetes mellitus with diabetic neuropathy, unspecified: Secondary | ICD-10-CM | POA: Diagnosis not present

## 2020-01-01 DIAGNOSIS — B351 Tinea unguium: Secondary | ICD-10-CM

## 2020-01-01 DIAGNOSIS — M79676 Pain in unspecified toe(s): Secondary | ICD-10-CM | POA: Diagnosis not present

## 2020-01-01 DIAGNOSIS — Z7901 Long term (current) use of anticoagulants: Secondary | ICD-10-CM

## 2020-01-01 NOTE — Progress Notes (Signed)
Chief Complaint  Patient presents with  . Nail Problem    Onychomycosis.  . Diabetes    Most recent A1c on file = 6.5.    Subjective:  Stephen Wilkinson presents today for routine foot care and nail trim.  He is diabetic. He relates a recent fall in the kitchen where he injured his back.  He is using a cane today. No other changes noted.  On Plavix.     PCP: Biagio Borg, MD   Objective: AAO 3, NAD DP/PT pulses palpable, CRT less than 3 seconds Nails hypertrophic, dystrophic, elongated, brittle, discolored 10. There is tenderness overlying the nails 1-5 bilaterally. There is no surrounding erythema or drainage along the nail sites.  Hyperkeratotic pre-ulcerative callus on the right foot submetatarsal 5.  no debridement needed today. Bunion present right No other areas of tenderness bilateral lower extremities. No overlying edema, erythema, increased warmth. No pain with calf compression, swelling, warmth, erythema.   Assessment:   ICD-10-CM   1. Type 2 diabetes mellitus with diabetic neuropathy, without long-term current use of insulin (HCC)  E11.40   2. Chronic anticoagulation  Z79.01   3. Mycotic toenails  B35.1   4. Pain due to onychomycosis of toenail  B35.1    M79.676       Plan: -Treatment options including alternatives, risks, complications were discussed -Nails sharply debrided 10 without complication/bleeding. With a nipper and power burr -Discussed daily foot inspection. If there are any changes, to call the office immediately.  -Follow-up in 3 months or sooner if any problems are to arise. In the meantime, encouraged to call the office with any questions, concerns, changes symptoms.

## 2020-01-03 DIAGNOSIS — S32020A Wedge compression fracture of second lumbar vertebra, initial encounter for closed fracture: Secondary | ICD-10-CM | POA: Diagnosis not present

## 2020-01-22 ENCOUNTER — Ambulatory Visit (INDEPENDENT_AMBULATORY_CARE_PROVIDER_SITE_OTHER): Payer: Medicare Other | Admitting: Ophthalmology

## 2020-01-22 ENCOUNTER — Other Ambulatory Visit: Payer: Self-pay

## 2020-01-22 ENCOUNTER — Encounter (INDEPENDENT_AMBULATORY_CARE_PROVIDER_SITE_OTHER): Payer: Self-pay | Admitting: Ophthalmology

## 2020-01-22 DIAGNOSIS — H353211 Exudative age-related macular degeneration, right eye, with active choroidal neovascularization: Secondary | ICD-10-CM | POA: Diagnosis not present

## 2020-01-22 DIAGNOSIS — H353113 Nonexudative age-related macular degeneration, right eye, advanced atrophic without subfoveal involvement: Secondary | ICD-10-CM | POA: Diagnosis not present

## 2020-01-22 DIAGNOSIS — H353124 Nonexudative age-related macular degeneration, left eye, advanced atrophic with subfoveal involvement: Secondary | ICD-10-CM | POA: Diagnosis not present

## 2020-01-22 MED ORDER — BEVACIZUMAB CHEMO INJECTION 1.25MG/0.05ML SYRINGE FOR KALEIDOSCOPE
1.2500 mg | INTRAVITREAL | Status: AC | PRN
  Administered 2020-01-22: 1.25 mg via INTRAVITREAL

## 2020-01-22 NOTE — Progress Notes (Signed)
01/22/2020     CHIEF COMPLAINT Patient presents for Retina Follow Up   HISTORY OF PRESENT ILLNESS: Stephen Wilkinson is a 86 y.o. male who presents to the clinic today for:   HPI    Retina Follow Up    Patient presents with  Wet AMD.  In right eye.  This started 7 weeks ago.  Severity is mild.  Duration of 7 weeks.  Since onset it is stable.          Comments    7 Week AMD F/U OU, poss Avastin OD  Pt denies noticeable changes to [place] OU since last visit. Pt denies ocular pain, flashes of light, or floaters OU.         Last edited by Rockie Neighbours, [place] on 01/22/2020  9:57 AM. (History)      Referring physician: Biagio Borg, MD [place],  [place] [id number]  HISTORICAL INFORMATION:   Selected notes from the MEDICAL RECORD NUMBER    Lab Results  Component Value Date   HGBA1C 6.5 10/03/2019     CURRENT MEDICATIONS: Current Outpatient Medications (Ophthalmic Drugs)  Medication Sig  . latanoprost (XALATAN) 0.005 % ophthalmic solution INSTILL 1 DROP INTO BOTH EYES AT BEDTIME   No current facility-administered medications for this visit. (Ophthalmic Drugs)   Current Outpatient Medications (Other)  Medication Sig  . amLODipine (NORVASC) 10 MG tablet Take 1 tablet (10 mg total) by mouth every morning.  Marland Kitchen aspirin 81 MG tablet Take 81 mg by mouth daily.  . Bevacizumab (AVASTIN IV) Place 1 drop into the right eye as directed. Every 6-8 weeks  . clobetasol cream (TEMOVATE) 7.12 % Apply 1 application topically daily as needed (for contact dermatitis). Contact dermatitis  . clopidogrel (PLAVIX) 75 MG tablet Take 1 tablet (75 mg total) by mouth daily.  . famotidine (PEPCID) 20 MG tablet Take 1 tablet (20 mg total) by mouth 2 (two) times daily.  Marland Kitchen FLUoxetine (PROZAC) 10 MG capsule Take 1 capsule (10 mg total) by mouth daily.  Marland Kitchen glucose blood (ONE TOUCH ULTRA TEST) test strip Use as directed once daily to check blood sugar.  Diagnosis code E11.42  . levothyroxine  (SYNTHROID) 125 MCG tablet Take 1 tablet (125 mcg total) by mouth daily before breakfast.  . metFORMIN (GLUCOPHAGE) 1000 MG tablet Take 1 tablet (1,000 mg total) by mouth daily.  . metoprolol succinate (TOPROL-XL) 25 MG 24 hr tablet Take 1 tablet (25 mg total) by mouth every morning.  . Multiple Vitamins-Minerals (ICAPS AREDS 2 PO) Take 2 capsules by mouth daily.  Marland Kitchen tiZANidine (ZANAFLEX) 4 MG tablet Take 1 tablet (4 mg total) by mouth every 6 (six) hours as needed for muscle spasms.   No current facility-administered medications for this visit. (Other)      REVIEW OF SYSTEMS:    ALLERGIES No Known Allergies  PAST MEDICAL HISTORY Past Medical History:  Diagnosis Date  . Acute blood loss anemia 05/15/2011   post surgical  . Altered mental status 05/15/2011  . Anxiety   . B12 deficiency 09/23/2016  . Blood transfusion without reported diagnosis   . Cancer ([place])    basal and squamous skin carcinoma   . Cataract   . Chronic kidney disease    hx of uti due to catheter hx of prostatitis, BPH -   . Depression   . Diabetes mellitus   . GERD (gastroesophageal reflux disease)   . Glaucoma 01/04/2013  . Heart murmur   .  History of nuclear stress test    Myoview 3/17: EF 62%, no ischemia or scar, low risk  . Hx of transient ischemic attack (TIA) 01/04/2013  . Hx: UTI (urinary tract infection)   . Hyperlipidemia   . Hypertension   . Hypokalemia 05/15/2011  . Hypothyroidism   . Insomnia   . Macular degeneration   . Neuropathy   . OA (osteoarthritis)   . Pneumonia   . PVC's (premature ventricular contractions)   . Renal mass   . Stroke ([place])    mid 1990s    Past Surgical History:  Procedure Laterality Date  . APPENDECTOMY    . CATARACT EXTRACTION W/ INTRAOCULAR LENS  IMPLANT, BILATERAL    . INGUINAL HERNIA REPAIR     X3  . LUMBAR LAMINECTOMY/DECOMPRESSION MICRODISCECTOMY N/A 02/12/2017   Procedure: Lumbar Three-Four, Lumbar Four-Five, Lumbar Five-Sacral One Laminectomy and  Foraminotomy;  Surgeon: Earnie Larsson, MD;  Location: [facility];  Service: Neurosurgery;  Laterality: N/A;  . OTHER SURGICAL HISTORY     microwave procedure for prostate - 09/12   . PROSTATE ABLATION     microwave  . REPLACEMENT TOTAL KNEE    . THYROIDECTOMY    . TONSILLECTOMY AND ADENOIDECTOMY    . TOTAL KNEE ARTHROPLASTY  05/11/2011   Procedure: TOTAL KNEE ARTHROPLASTY;  Surgeon: Gearlean Alf;  Location: [facility];  Service: Orthopedics;  Laterality: Right;  . US ECHOCARDIOGRAPHY  11/10/2006   EF 55-60%  . US ECHOCARDIOGRAPHY  09/20/2002   EF 65-70%    FAMILY HISTORY Family History  Problem Relation Age of Onset  . Hypertension Mother   . Arthritis Mother   . Diabetes Mother   . Hypertension Father   . Stroke Father   . Kidney disease Father   . Hypertension Brother   . Diabetes Brother     SOCIAL HISTORY Social History   Tobacco Use  . Smoking status: Never Smoker  . Smokeless tobacco: Never Used  Vaping Use  . Vaping Use: Never used  Substance Use Topics  . Alcohol use: Yes    Alcohol/week: 7.0 standard drinks    Types: 7 Shots of liquor per week    Comment: 2 drinks most days  . Drug use: No         OPHTHALMIC EXAM:  Base Eye Exam    Visual Acuity (ETDRS)      Right Left   Dist cc 20/25 -2 CF @ 3'   Dist ph cc  NI   Correction: Glasses       Tonometry (Tonopen, 10:01 AM)      Right Left   Pressure 16 18       Pupils      Pupils Dark Light Shape React APD   Right PERRL 3 2 Round Brisk None   Left PERRL 3 2 Round Brisk None       Visual Fields (Counting fingers)      Left Right     Full   Restrictions Total superior temporal deficiency        Extraocular Movement      Right Left    Full Full       Neuro/Psych    Oriented x3: Yes   Mood/Affect: Normal       Dilation    Both eyes: 1.0% Mydriacyl, 2.5% Phenylephrine @ 10:01 AM        Slit Lamp and Fundus Exam    External Exam      Right Left  External Normal Normal       Slit  Lamp Exam      Right Left   Lids/Lashes Normal Normal   Conjunctiva/Sclera White and quiet White and quiet   Cornea Clear Clear   Anterior Chamber Deep and quiet Deep and quiet   Iris Round and reactive Round and reactive   Lens Posterior chamber intraocular lens Posterior chamber intraocular lens   Anterior Vitreous Normal Normal       Fundus Exam      Right Left   Posterior Vitreous Normal Clear and vitrectomized   Disc Normal Normal   C/D Ratio 0.2-0.3 0.35   Macula Advanced age related macular degeneration, Geographic atrophy, Hard drusen, no exudates, no clinical Subretinal neovascular membrane, no hemorrhage, Retinal pigment epithelial mottling Advanced age related macular degeneration, Pigmented atrophy, Retinal pigment epithelial atrophy - geographic   Vessels Normal Normal   Periphery Normal Normal          IMAGING AND PROCEDURES  Imaging and Procedures for 01/22/20  OCT, Retina - OU - Both Eyes       Right Eye Quality was good. Scan locations included subfoveal. Central Foveal Thickness: 229. Progression has improved. Findings include central retinal atrophy, outer retinal atrophy, inner retinal atrophy, subretinal hyper-reflective material, retinal drusen , no SRF, no IRF.   Left Eye Scan locations included subfoveal. Central Foveal Thickness: 290. Progression has been stable. Findings include no SRF, central retinal atrophy, outer retinal atrophy, inner retinal atrophy.        Intravitreal Injection, Pharmacologic Agent - OD - Right Eye       Time Out 01/22/2020. 10:25 AM. Confirmed correct patient, procedure, site, and patient consented.   Anesthesia Topical anesthesia was used. Anesthetic medications included Akten 3.5%.   Procedure Preparation included Tobramycin 0.3%, Ofloxacin , 5% betadine to ocular surface. A 30 gauge needle was used.   Injection:  1.25 mg Bevacizumab (AVASTIN) SOLN   NDC: [address]-1   Route: Intravitreal, Site: Right Eye,  Waste: 0 mg  Post-op Post injection exam found visual acuity of at least counting fingers. The patient tolerated the procedure well. There were no complications. Post injection medications were not given.                 ASSESSMENT/PLAN:  Advanced nonexudative age-related macular degeneration of right eye without subfoveal involvement OD with no active CNVM today, currently at 7-week interval.  Monocular patient with history of multiple recurrences and high risk for vision loss.  Repeat injection intravitreal Avastin today and examination same interval 7 weeks  Advanced nonexudative age-related macular degeneration of left eye with subfoveal involvement OS, stable condition.  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 Exudative age-related macular degeneration of right eye with active choroidal neovascularization (HCC)  H35.3211 OCT, Retina - OU - Both Eyes    Intravitreal Injection, Pharmacologic Agent - OD - Right Eye    Bevacizumab (AVASTIN) SOLN 1.25 mg  2. Advanced nonexudative age-related macular degeneration of right eye without subfoveal involvement  H35.3113   3. Advanced nonexudative age-related macular degeneration of left eye with subfoveal involvement  H35.3124     1.  2.  3.  Ophthalmic Meds Ordered this visit:  Meds ordered this encounter  Medications  . Bevacizumab (AVASTIN) SOLN 1.25 mg       Return in about 7 weeks (around 03/11/2020) for dilate, OD, AVASTIN OCT.  There are no  Patient Instructions on file for this visit.   Explained the diagnoses, plan, and follow up with the patient and they expressed understanding.  Patient expressed understanding of the importance of proper follow up care.   Clent Demark Johnnye Sandford M.D. Diseases & Surgery of the Retina and Vitreous [facility] 01/22/20     Abbreviations: M myopia (nearsighted); A astigmatism; H hyperopia  (farsighted); P presbyopia; Mrx spectacle prescription;  CTL contact lenses; OD right eye; OS left eye; OU both eyes  XT exotropia; ET esotropia; PEK punctate epithelial keratitis; PEE punctate epithelial erosions; DES dry eye syndrome; MGD meibomian gland dysfunction; ATs artificial tears; PFAT's preservative free artificial tears; [place] nuclear sclerotic cataract; PSC posterior subcapsular cataract; ERM epi-retinal membrane; PVD posterior vitreous detachment; RD retinal detachment; DM diabetes mellitus; DR diabetic retinopathy; NPDR non-proliferative diabetic retinopathy; PDR proliferative diabetic retinopathy; CSME clinically significant macular edema; DME diabetic macular edema; dbh dot blot hemorrhages; CWS cotton wool spot; POAG primary open angle glaucoma; C/D cup-to-disc ratio; HVF humphrey visual field; GVF goldmann visual field; OCT optical coherence tomography; IOP intraocular pressure; BRVO Branch retinal vein occlusion; CRVO central retinal vein occlusion; CRAO central retinal artery occlusion; BRAO branch retinal artery occlusion; RT retinal tear; SB scleral buckle; PPV pars plana vitrectomy; VH Vitreous hemorrhage; PRP panretinal laser photocoagulation; IVK intravitreal kenalog; VMT vitreomacular traction; MH Macular hole;  NVD neovascularization of the disc; NVE neovascularization elsewhere; AREDS age related eye disease study; ARMD age related macular degeneration; POAG primary open angle glaucoma; EBMD epithelial/anterior basement membrane dystrophy; ACIOL anterior chamber intraocular lens; IOL intraocular lens; PCIOL posterior chamber intraocular lens; Phaco/IOL phacoemulsification with intraocular lens placement; [place] photorefractive keratectomy; LASIK laser assisted in situ keratomileusis; HTN hypertension; DM diabetes mellitus; COPD chronic obstructive pulmonary disease

## 2020-01-22 NOTE — Assessment & Plan Note (Signed)
OS, stable condition.  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

## 2020-01-22 NOTE — Assessment & Plan Note (Signed)
OD with no active CNVM today, currently at 7-week interval.  Monocular patient with history of multiple recurrences and high risk for vision loss.  Repeat injection intravitreal Avastin today and examination same interval 7 weeks

## 2020-01-24 DIAGNOSIS — S32020A Wedge compression fracture of second lumbar vertebra, initial encounter for closed fracture: Secondary | ICD-10-CM | POA: Diagnosis not present

## 2020-01-30 ENCOUNTER — Telehealth: Payer: Self-pay | Admitting: *Deleted

## 2020-01-30 NOTE — Telephone Encounter (Signed)
Note faxed to surgeon's office. Note will be removed from [place], [place]  3:02 PM 01/30/2020

## 2020-01-30 NOTE — Telephone Encounter (Signed)
   [facility] Pre-operative Risk Assessment    HEARTCARE STAFF: - Please ensure there is not already an duplicate clearance open for this procedure. - Under Visit Info/Reason for Call, type in Other and utilize the format Clearance MM/DD/YY or Clearance TBD. Do not use dashes or single digits. - If request is for dental extraction, please clarify the # of teeth to be extracted.  Request for surgical clearance:  1. What type of surgery is being performed? L2 KYPHOPLASTY   2. When is this surgery scheduled? 02/08/20   3. What type of clearance is required (medical clearance vs. Pharmacy clearance to hold med vs. Both)? MEDICAL  4. Are there any medications that need to be held prior to surgery and how long? ASA AND PLAVIX   5. Practice name and name of physician performing surgery? [facility]; DR. Mallie Mussel POOL   6. What is the office phone number? [phone]   7.   What is the office fax number? [place]: VANESSA  8.   Anesthesia type (None, local, MAC, general) ? GENERAL   Julaine Hua 01/30/2020, 1:58 PM  _________________________________________________________________   (provider comments below)

## 2020-01-30 NOTE — Telephone Encounter (Signed)
   Primary Cardiologist: Kirk Ruths, MD  Chart reviewed as part of pre-operative protocol coverage. Patient was contacted 01/30/2020 in reference to pre-operative risk assessment for pending surgery as outlined below.   Philipe Laswell has a history of prior stroke, diabetes, hypertension, syncope.  Nuclear stress test 2017 was negative for ischemia.  Echocardiogram in April 2021 demonstrated normal LV function mild aortic stenosis.  An event monitor in April 2021 demonstrated no significant arrhythmia.    He was last seen on 12/05/2019 by Dr. Stanford Breed.  Since that day, Koi Zangara has done well without chest pain or shortness of breath or syncope.  His perioperative risk of major cardiac event is low at 0.9% according to the revised cardiac risk index (RCRI).  Recommendations: Therefore, based on ACC/AHA guidelines, the patient would be at acceptable risk for the planned procedure without further cardiovascular testing.   He may hold his aspirin and Plavix as needed prior to his procedure.  This should be resumed postoperatively as soon as is felt to be safe.  Please call with questions.  Richardson Dopp, PA-C 01/30/2020, 2:57 PM

## 2020-02-08 DIAGNOSIS — S32020A Wedge compression fracture of second lumbar vertebra, initial encounter for closed fracture: Secondary | ICD-10-CM | POA: Diagnosis not present

## 2020-03-05 DIAGNOSIS — H353211 Exudative age-related macular degeneration, right eye, with active choroidal neovascularization: Secondary | ICD-10-CM | POA: Diagnosis not present

## 2020-03-05 DIAGNOSIS — E119 Type 2 diabetes mellitus without complications: Secondary | ICD-10-CM | POA: Diagnosis not present

## 2020-03-05 DIAGNOSIS — H52201 Unspecified astigmatism, right eye: Secondary | ICD-10-CM | POA: Diagnosis not present

## 2020-03-05 DIAGNOSIS — Z961 Presence of intraocular lens: Secondary | ICD-10-CM | POA: Diagnosis not present

## 2020-03-05 LAB — HM DIABETES EYE EXAM

## 2020-03-06 ENCOUNTER — Encounter: Payer: Self-pay | Admitting: Internal Medicine

## 2020-03-12 ENCOUNTER — Other Ambulatory Visit: Payer: Self-pay

## 2020-03-12 ENCOUNTER — Ambulatory Visit (INDEPENDENT_AMBULATORY_CARE_PROVIDER_SITE_OTHER): Payer: Medicare Other | Admitting: Ophthalmology

## 2020-03-12 ENCOUNTER — Encounter (INDEPENDENT_AMBULATORY_CARE_PROVIDER_SITE_OTHER): Payer: Self-pay | Admitting: Ophthalmology

## 2020-03-12 ENCOUNTER — Encounter (INDEPENDENT_AMBULATORY_CARE_PROVIDER_SITE_OTHER): Payer: Medicare Other | Admitting: Ophthalmology

## 2020-03-12 DIAGNOSIS — H353211 Exudative age-related macular degeneration, right eye, with active choroidal neovascularization: Secondary | ICD-10-CM | POA: Diagnosis not present

## 2020-03-12 MED ORDER — BEVACIZUMAB CHEMO INJECTION 1.25MG/0.05ML SYRINGE FOR KALEIDOSCOPE
1.2500 mg | INTRAVITREAL | Status: AC | PRN
  Administered 2020-03-12: 1.25 mg via INTRAVITREAL

## 2020-03-12 NOTE — Progress Notes (Signed)
03/12/2020     CHIEF COMPLAINT Patient presents for Retina Follow Up   HISTORY OF PRESENT ILLNESS: Stephen Wilkinson is a 86 y.o. male who presents to the clinic today for:   HPI    Retina Follow Up    Patient presents with  Wet AMD.  In right eye.  This started 7 weeks ago.  Severity is mild.  Duration of 7 weeks.  Since onset it is stable.          Comments    7 Week AMD F/U OD, poss Avastin OD  Pt denies noticeable changes to [place] OU since last visit. Pt denies ocular pain, flashes of light, or floaters OU.         Last edited by Rockie Neighbours, [place] on 03/12/2020 10:08 AM. (History)      Referring physician: Biagio Borg, MD [place],  [place] [address]  HISTORICAL INFORMATION:   Selected notes from the MEDICAL RECORD NUMBER    Lab Results  Component Value Date   HGBA1C 6.5 10/03/2019     CURRENT MEDICATIONS: Current Outpatient Medications (Ophthalmic Drugs)  Medication Sig  . latanoprost (XALATAN) 0.005 % ophthalmic solution INSTILL 1 DROP INTO BOTH EYES AT BEDTIME   No current facility-administered medications for this visit. (Ophthalmic Drugs)   Current Outpatient Medications (Other)  Medication Sig  . amLODipine (NORVASC) 10 MG tablet Take 1 tablet (10 mg total) by mouth every morning.  Marland Kitchen aspirin 81 MG tablet Take 81 mg by mouth daily.  . Bevacizumab (AVASTIN IV) Place 1 drop into the right eye as directed. Every 6-8 weeks  . clobetasol cream (TEMOVATE) 5.63 % Apply 1 application topically daily as needed (for contact dermatitis). Contact dermatitis  . clopidogrel (PLAVIX) 75 MG tablet Take 1 tablet (75 mg total) by mouth daily.  . famotidine (PEPCID) 20 MG tablet Take 1 tablet (20 mg total) by mouth 2 (two) times daily.  Marland Kitchen FLUoxetine (PROZAC) 10 MG capsule Take 1 capsule (10 mg total) by mouth daily.  Marland Kitchen glucose blood (ONE TOUCH ULTRA TEST) test strip Use as directed once daily to check blood sugar.  Diagnosis code E11.42  . levothyroxine  (SYNTHROID) 125 MCG tablet Take 1 tablet (125 mcg total) by mouth daily before breakfast.  . metFORMIN (GLUCOPHAGE) 1000 MG tablet Take 1 tablet (1,000 mg total) by mouth daily.  . metoprolol succinate (TOPROL-XL) 25 MG 24 hr tablet Take 1 tablet (25 mg total) by mouth every morning.  . Multiple Vitamins-Minerals (ICAPS AREDS 2 PO) Take 2 capsules by mouth daily.  Marland Kitchen tiZANidine (ZANAFLEX) 4 MG tablet Take 1 tablet (4 mg total) by mouth every 6 (six) hours as needed for muscle spasms.   No current facility-administered medications for this visit. (Other)      REVIEW OF SYSTEMS:    ALLERGIES No Known Allergies  PAST MEDICAL HISTORY Past Medical History:  Diagnosis Date  . Acute blood loss anemia 05/15/2011   post surgical  . Altered mental status 05/15/2011  . Anxiety   . B12 deficiency 09/23/2016  . Blood transfusion without reported diagnosis   . Cancer ([place])    basal and squamous skin carcinoma   . Cataract   . Chronic kidney disease    hx of uti due to catheter hx of prostatitis, BPH -   . Depression   . Diabetes mellitus   . GERD (gastroesophageal reflux disease)   . Glaucoma 01/04/2013  . Heart murmur   . History  of nuclear stress test    Myoview 3/17: EF 62%, no ischemia or scar, low risk  . Hx of transient ischemic attack (TIA) 01/04/2013  . Hx: UTI (urinary tract infection)   . Hyperlipidemia   . Hypertension   . Hypokalemia 05/15/2011  . Hypothyroidism   . Insomnia   . Macular degeneration   . Neuropathy   . OA (osteoarthritis)   . Pneumonia   . PVC's (premature ventricular contractions)   . Renal mass   . Stroke ([place])    mid 1990s    Past Surgical History:  Procedure Laterality Date  . APPENDECTOMY    . CATARACT EXTRACTION W/ INTRAOCULAR LENS  IMPLANT, BILATERAL    . INGUINAL HERNIA REPAIR     X3  . LUMBAR LAMINECTOMY/DECOMPRESSION MICRODISCECTOMY N/A 02/12/2017   Procedure: Lumbar Three-Four, Lumbar Four-Five, Lumbar Five-Sacral One Laminectomy and  Foraminotomy;  Surgeon: Earnie Larsson, MD;  Location: [facility];  Service: Neurosurgery;  Laterality: N/A;  . OTHER SURGICAL HISTORY     microwave procedure for prostate - 09/12   . PROSTATE ABLATION     microwave  . REPLACEMENT TOTAL KNEE    . THYROIDECTOMY    . TONSILLECTOMY AND ADENOIDECTOMY    . TOTAL KNEE ARTHROPLASTY  05/11/2011   Procedure: TOTAL KNEE ARTHROPLASTY;  Surgeon: Gearlean Alf;  Location: [facility];  Service: Orthopedics;  Laterality: Right;  . US ECHOCARDIOGRAPHY  11/10/2006   EF 55-60%  . US ECHOCARDIOGRAPHY  09/20/2002   EF 65-70%    FAMILY HISTORY Family History  Problem Relation Age of Onset  . Hypertension Mother   . Arthritis Mother   . Diabetes Mother   . Hypertension Father   . Stroke Father   . Kidney disease Father   . Hypertension Brother   . Diabetes Brother     SOCIAL HISTORY Social History   Tobacco Use  . Smoking status: Never Smoker  . Smokeless tobacco: Never Used  Vaping Use  . Vaping Use: Never used  Substance Use Topics  . Alcohol use: Yes    Alcohol/week: 7.0 standard drinks    Types: 7 Shots of liquor per week    Comment: 2 drinks most days  . Drug use: No         OPHTHALMIC EXAM:  Base Eye Exam    Visual Acuity (ETDRS)      Right Left   Dist cc 20/30 +1 CF at 3'   Dist ph cc  NI   Correction: Glasses       Tonometry (Tonopen, 10:09 AM)      Right Left   Pressure 19 20       Pupils      Pupils Dark Light Shape React APD   Right PERRL 3 2 Round Brisk None   Left PERRL 3 2 Round Brisk None       Visual Fields (Counting fingers)      Left Right     Full   Restrictions Total superior temporal deficiency        Extraocular Movement      Right Left    Full Full       Neuro/Psych    Oriented x3: Yes   Mood/Affect: Normal       Dilation    Right eye: 1.0% Mydriacyl, 2.5% Phenylephrine @ 10:15 AM        Slit Lamp and Fundus Exam    External Exam      Right Left  External Normal Normal       Slit  Lamp Exam      Right Left   Lids/Lashes Normal Normal   Conjunctiva/Sclera White and quiet White and quiet   Cornea Clear Clear   Anterior Chamber Deep and quiet Deep and quiet   Iris Round and reactive Round and reactive   Lens Posterior chamber intraocular lens Posterior chamber intraocular lens   Anterior Vitreous Normal Normal       Fundus Exam      Right Left   Posterior Vitreous Normal    Disc Normal    C/D Ratio 0.2-0.3    Macula Advanced age related macular degeneration, Geographic atrophy, Hard drusen, no exudates, no clinical Subretinal neovascular membrane, no hemorrhage, Retinal pigment epithelial mottling    Vessels Normal    Periphery Normal           IMAGING AND PROCEDURES  Imaging and Procedures for 03/12/20  OCT, Retina - OU - Both Eyes       Right Eye Quality was good. Scan locations included subfoveal. Central Foveal Thickness: 253. Progression has improved. Findings include abnormal foveal contour, disciform scar, no IRF, no SRF, subretinal hyper-reflective material.   Left Eye Quality was good. Scan locations included subfoveal. Central Foveal Thickness: 223. Progression has been stable. Findings include abnormal foveal contour, disciform scar.        Intravitreal Injection, Pharmacologic Agent - OD - Right Eye       Time Out 03/12/2020. 11:31 AM. Confirmed correct patient, procedure, site, and patient consented.   Anesthesia Topical anesthesia was used. Anesthetic medications included [place] 3.5%.   Procedure Preparation included Ofloxacin , 10% betadine to eyelids, 5% betadine to ocular surface. A supplied needle was used.   Injection:  1.25 mg Bevacizumab (AVASTIN) SOLN   NDC: [address]-8, Lot: [id number]   Route: Intravitreal, Site: Right Eye, Waste: 0 mg  Post-op Post injection exam found visual acuity of at least counting fingers. The patient tolerated the procedure well. There were no complications. The patient received written and  verbal post procedure care education. Post injection medications were not given.                 ASSESSMENT/PLAN:  Exudative age-related macular degeneration of right eye with active choroidal neovascularization (HCC) Old disciform macular scar OD is the new perfusion to the outer retina, controlled for the last 12 to 15 years at 7-week interval.  Repeat injection Avastin today as maintenance      ICD-10-CM   1. Exudative age-related macular degeneration of right eye with active choroidal neovascularization (HCC)  H35.3211 OCT, Retina - OU - Both Eyes    Intravitreal Injection, Pharmacologic Agent - OD - Right Eye    Bevacizumab (AVASTIN) SOLN 1.25 mg    1.  2.  3.  Ophthalmic Meds Ordered this visit:  Meds ordered this encounter  Medications  . Bevacizumab (AVASTIN) SOLN 1.25 mg       Return in about 7 weeks (around 04/30/2020) for dilate, OD, AVASTIN OCT.  There are no Patient Instructions on file for this visit.   Explained the diagnoses, plan, and follow up with the patient and they expressed understanding.  Patient expressed understanding of the importance of proper follow up care.   Clent Demark Almando Brawley M.D. Diseases & Surgery of the Retina and Vitreous [facility] [place] 03/12/20     Abbreviations: M myopia (nearsighted); A astigmatism; H hyperopia (farsighted); P presbyopia; Mrx spectacle prescription;  CTL  contact lenses; OD right eye; OS left eye; OU both eyes  XT exotropia; ET esotropia; PEK punctate epithelial keratitis; PEE punctate epithelial erosions; DES dry eye syndrome; MGD meibomian gland dysfunction; ATs artificial tears; PFAT's preservative free artificial tears; [place] nuclear sclerotic cataract; PSC posterior subcapsular cataract; ERM epi-retinal membrane; PVD posterior vitreous detachment; RD retinal detachment; DM diabetes mellitus; DR diabetic retinopathy; NPDR non-proliferative diabetic retinopathy; PDR proliferative diabetic  retinopathy; CSME clinically significant macular edema; DME diabetic macular edema; dbh dot blot hemorrhages; CWS cotton wool spot; POAG primary open angle glaucoma; C/D cup-to-disc ratio; HVF humphrey visual field; GVF goldmann visual field; OCT optical coherence tomography; IOP intraocular pressure; BRVO Branch retinal vein occlusion; CRVO central retinal vein occlusion; CRAO central retinal artery occlusion; BRAO branch retinal artery occlusion; RT retinal tear; SB scleral buckle; PPV pars plana vitrectomy; VH Vitreous hemorrhage; PRP panretinal laser photocoagulation; IVK intravitreal kenalog; VMT vitreomacular traction; MH Macular hole;  NVD neovascularization of the disc; NVE neovascularization elsewhere; AREDS age related eye disease study; ARMD age related macular degeneration; POAG primary open angle glaucoma; EBMD epithelial/anterior basement membrane dystrophy; ACIOL anterior chamber intraocular lens; IOL intraocular lens; PCIOL posterior chamber intraocular lens; Phaco/IOL phacoemulsification with intraocular lens placement; [place] photorefractive keratectomy; LASIK laser assisted in situ keratomileusis; HTN hypertension; DM diabetes mellitus; COPD chronic obstructive pulmonary disease

## 2020-03-12 NOTE — Assessment & Plan Note (Signed)
Old disciform macular scar OD is the new perfusion to the outer retina, controlled for the last 12 to 15 years at 7-week interval.  Repeat injection Avastin today as maintenance

## 2020-03-18 ENCOUNTER — Other Ambulatory Visit: Payer: Self-pay | Admitting: Internal Medicine

## 2020-03-18 ENCOUNTER — Other Ambulatory Visit: Payer: Self-pay | Admitting: Cardiology

## 2020-03-18 DIAGNOSIS — I119 Hypertensive heart disease without heart failure: Secondary | ICD-10-CM

## 2020-03-27 DIAGNOSIS — Z23 Encounter for immunization: Secondary | ICD-10-CM | POA: Diagnosis not present

## 2020-04-03 ENCOUNTER — Ambulatory Visit: Payer: Medicare Other | Admitting: Internal Medicine

## 2020-04-08 ENCOUNTER — Other Ambulatory Visit: Payer: Self-pay

## 2020-04-08 ENCOUNTER — Encounter: Payer: Self-pay | Admitting: Podiatrist

## 2020-04-08 ENCOUNTER — Ambulatory Visit (INDEPENDENT_AMBULATORY_CARE_PROVIDER_SITE_OTHER): Payer: Medicare Other | Admitting: Podiatrist

## 2020-04-08 DIAGNOSIS — E1140 Type 2 diabetes mellitus with diabetic neuropathy, unspecified: Secondary | ICD-10-CM

## 2020-04-08 DIAGNOSIS — B351 Tinea unguium: Secondary | ICD-10-CM

## 2020-04-08 DIAGNOSIS — Z7901 Long term (current) use of anticoagulants: Secondary | ICD-10-CM

## 2020-04-08 DIAGNOSIS — M79676 Pain in unspecified toe(s): Secondary | ICD-10-CM | POA: Diagnosis not present

## 2020-04-08 NOTE — Progress Notes (Signed)
Subjective:  Stephen Wilkinson presents today for routine foot care and nail trim.  He is diabetic. He relates a recent fall in the garden and states he has some pain in his back but it is OK.  No other changes noted.   On Plavix.     PCP: Biagio Borg, MD   Objective: AAO 3, NAD DP/PT pulses palpable, CRT less than 3 seconds Nails hypertrophic, dystrophic, elongated, brittle, discolored 10. There is tenderness overlying the nails 1-5 bilaterally. There is no surrounding erythema or drainage along the nail sites.  Hyperkeratotic pre-ulcerative callus on the right foot submetatarsal 5.  no debridement needed today. Bunion present right No other areas of tenderness bilateral lower extremities. No overlying edema, erythema, increased warmth. No pain with calf compression, swelling, warmth, erythema.   Assessment:     ICD-10-CM    1. Type 2 diabetes mellitus with diabetic neuropathy, without long-term current use of insulin (HCC)  E11.40    2. Chronic anticoagulation  Z79.01    3. Mycotic toenails  B35.1    4. Pain due to onychomycosis of toenail  B35.1      M79.676          Plan:  -Nails sharply debrided 10 without complication/bleeding. With a nipper and power burr -Discussed daily foot inspection. If there are any changes, to call the office immediately.  -Follow-up in 3 months or sooner if any problems are to arise. In the meantime, encouraged to call the office with any questions, concerns, changes symptoms.

## 2020-04-10 ENCOUNTER — Ambulatory Visit (INDEPENDENT_AMBULATORY_CARE_PROVIDER_SITE_OTHER): Payer: Medicare Other | Admitting: Internal Medicine

## 2020-04-10 ENCOUNTER — Encounter: Payer: Self-pay | Admitting: Internal Medicine

## 2020-04-10 ENCOUNTER — Other Ambulatory Visit: Payer: Self-pay

## 2020-04-10 VITALS — BP 140/78 | HR 68 | Temp 98.3°F | Ht 73.0 in | Wt 175.0 lb

## 2020-04-10 DIAGNOSIS — R296 Repeated falls: Secondary | ICD-10-CM | POA: Diagnosis not present

## 2020-04-10 DIAGNOSIS — H6123 Impacted cerumen, bilateral: Secondary | ICD-10-CM | POA: Diagnosis not present

## 2020-04-10 DIAGNOSIS — R2689 Other abnormalities of gait and mobility: Secondary | ICD-10-CM | POA: Diagnosis not present

## 2020-04-10 DIAGNOSIS — E034 Atrophy of thyroid (acquired): Secondary | ICD-10-CM | POA: Diagnosis not present

## 2020-04-10 DIAGNOSIS — F419 Anxiety disorder, unspecified: Secondary | ICD-10-CM

## 2020-04-10 DIAGNOSIS — E1142 Type 2 diabetes mellitus with diabetic polyneuropathy: Secondary | ICD-10-CM

## 2020-04-10 DIAGNOSIS — R269 Unspecified abnormalities of gait and mobility: Secondary | ICD-10-CM

## 2020-04-10 DIAGNOSIS — F32A Depression, unspecified: Secondary | ICD-10-CM

## 2020-04-10 DIAGNOSIS — H900 Conductive hearing loss, bilateral: Secondary | ICD-10-CM | POA: Diagnosis not present

## 2020-04-10 DIAGNOSIS — E538 Deficiency of other specified B group vitamins: Secondary | ICD-10-CM | POA: Diagnosis not present

## 2020-04-10 MED ORDER — CLOPIDOGREL BISULFATE 75 MG PO TABS
75.0000 mg | ORAL_TABLET | Freq: Every day | ORAL | 3 refills | Status: DC
Start: 2020-04-10 — End: 2020-12-24

## 2020-04-10 MED ORDER — METFORMIN HCL 1000 MG PO TABS
1000.0000 mg | ORAL_TABLET | Freq: Every day | ORAL | 3 refills | Status: DC
Start: 2020-04-10 — End: 2020-08-13

## 2020-04-10 MED ORDER — GLUCOSE BLOOD VI STRP
ORAL_STRIP | 11 refills | Status: DC
Start: 2020-04-10 — End: 2020-10-09

## 2020-04-10 MED ORDER — FAMOTIDINE 20 MG PO TABS
20.0000 mg | ORAL_TABLET | Freq: Two times a day (BID) | ORAL | 3 refills | Status: DC
Start: 2020-04-10 — End: 2023-07-01

## 2020-04-10 MED ORDER — VITAMIN B-12 1000 MCG PO TABS: 1000.0000 ug | ORAL_TABLET | Freq: Every day | ORAL | 3 refills | Status: DC

## 2020-04-10 MED ORDER — ZOSTER VAC RECOMB ADJUVANTED 50 MCG/0.5ML IM SUSR: 0.5000 mL | Freq: Once | INTRAMUSCULAR | 1 refills | Status: AC

## 2020-04-10 NOTE — Progress Notes (Signed)
Subjective:    Patient ID: Stephen Wilkinson, male    DOB: [birth year], 86 y.o.   MRN: [id number]  HPI  Here to f/u; overall doing ok,  Pt denies chest pain, increasing sob or doe, wheezing, orthopnea, PND, increased LE swelling, palpitations, dizziness or syncope.  Pt denies new neurological symptoms such as new headache, or facial or extremity weakness or numbness.  Pt denies polydipsia, polyuria,  Pt states overall good compliance with meds, mostly trying to follow appropriate diet, with wt overall stable,  but little exercise however, and has seemed to take a significant turn for him in that he has become more fragile, fatigue, higher risk of fall and in fact has fallen several times in the past month.  Now walks with cane all the time, and does not want any kind of significant testing for a source today- "just getting old."  He is willing to have PT referral however.  Denies worsening depressive symptoms, suicidal ideation, or panic; has ongoing anxiety, overall stable it seems recently.  He is taking his B12 oral, and declines further labs today even such as a1c poct.  Does have reduced hearing in both ears and possible wax impactions again, so asks for this to be checked, denies pain, HA, fever or d/c,  Due for phizer covid booster soon Past Medical History:  Diagnosis Date  . Acute blood loss anemia 05/15/2011   post surgical  . Altered mental status 05/15/2011  . Anxiety   . B12 deficiency 09/23/2016  . Blood transfusion without reported diagnosis   . Cancer ([place])    basal and squamous skin carcinoma   . Cataract   . Chronic kidney disease    hx of uti due to catheter hx of prostatitis, BPH -   . Depression   . Diabetes mellitus   . GERD (gastroesophageal reflux disease)   . Glaucoma 01/04/2013  . Heart murmur   . History of nuclear stress test    Myoview 3/17: EF 62%, no ischemia or scar, low risk  . Hx of transient ischemic attack (TIA) 01/04/2013  . Hx: UTI (urinary tract infection)   .  Hyperlipidemia   . Hypertension   . Hypokalemia 05/15/2011  . Hypothyroidism   . Insomnia   . Macular degeneration   . Neuropathy   . OA (osteoarthritis)   . Pneumonia   . PVC's (premature ventricular contractions)   . Renal mass   . Stroke ([place])    mid 1990s    Past Surgical History:  Procedure Laterality Date  . APPENDECTOMY    . CATARACT EXTRACTION W/ INTRAOCULAR LENS  IMPLANT, BILATERAL    . INGUINAL HERNIA REPAIR     X3  . LUMBAR LAMINECTOMY/DECOMPRESSION MICRODISCECTOMY N/A 02/12/2017   Procedure: Lumbar Three-Four, Lumbar Four-Five, Lumbar Five-Sacral One Laminectomy and Foraminotomy;  Surgeon: Earnie Larsson, MD;  Location: [facility];  Service: Neurosurgery;  Laterality: N/A;  . OTHER SURGICAL HISTORY     microwave procedure for prostate - 09/12   . PROSTATE ABLATION     microwave  . REPLACEMENT TOTAL KNEE    . THYROIDECTOMY    . TONSILLECTOMY AND ADENOIDECTOMY    . TOTAL KNEE ARTHROPLASTY  05/11/2011   Procedure: TOTAL KNEE ARTHROPLASTY;  Surgeon: Gearlean Alf;  [facility];  Service: Orthopedics;  Laterality: Right;  . US ECHOCARDIOGRAPHY  11/10/2006   EF 55-60%  . US ECHOCARDIOGRAPHY  09/20/2002   EF 65-70%    reports that he has never smoked. He  has never used smokeless tobacco. He reports current alcohol use of about 7.0 standard drinks of alcohol per week. He reports that he does not use drugs. family history includes Arthritis in his mother; Diabetes in his brother and mother; Hypertension in his brother, father, and mother; Kidney disease in his father; Stroke in his father. No Known Allergies Current Outpatient Medications on File Prior to Visit  Medication Sig Dispense Refill  . amLODipine (NORVASC) 10 MG tablet Take 1 tablet (10 mg total) by mouth every morning. 90 tablet 3  . aspirin 81 MG tablet Take 81 mg by mouth daily.    . Bevacizumab (AVASTIN IV) Place 1 drop into the right eye as directed. Every 6-8 weeks    . clobetasol cream (TEMOVATE) 2.42 %  Apply 1 application topically daily as needed (for contact dermatitis). Contact dermatitis  3  . FLUoxetine (PROZAC) 10 MG capsule Take 1 capsule (10 mg total) by mouth daily. 90 capsule 3  . latanoprost (XALATAN) 0.005 % ophthalmic solution INSTILL 1 DROP INTO BOTH EYES AT BEDTIME 2.5 mL 12  . levothyroxine (SYNTHROID) 125 MCG tablet Take 1 tablet (125 mcg total) by mouth daily before breakfast. 90 tablet 3  . metoprolol succinate (TOPROL-XL) 25 MG 24 hr tablet TAKE 1 TABLET EVERY MORNING 90 tablet 2  . Multiple Vitamins-Minerals (ICAPS AREDS 2 PO) Take 2 capsules by mouth daily.    Marland Kitchen tiZANidine (ZANAFLEX) 4 MG tablet Take 1 tablet (4 mg total) by mouth every 6 (six) hours as needed for muscle spasms. 360 tablet 3   No current facility-administered medications on file prior to visit.   Review of Systems All otherwise neg per pt    Objective:   Physical Exam BP 140/78 (BP Location: Right Arm, Patient Position: Sitting, Cuff Size: Large)   Pulse 68   Temp 98.3 F (36.8 C) (Oral)   Ht 6\' 1"  (1.854 m)   Wt 175 lb (79.4 kg)   SpO2 95%   BMI 23.09 kg/m  VS noted,  Constitutional: Pt appears in NAD HENT: Head: NCAT.  Right Ear: External ear normal.  Left Ear: External ear normal.  Eyes: . Pupils are equal, round, and reactive to light. Conjunctivae and EOM are normal Nose: without d/c or deformity Neck: Neck supple. Gross normal ROM Cardiovascular: Normal rate and regular rhythm.   Pulmonary/Chest: Effort normal and breath sounds without rales or wheezing.  Abd:  Soft, NT, ND, + BS, no organomegaly Neurological: Pt is alert. At baseline orientation, motor grossly intact, gait more unsteady with cane Skin: Skin is warm. No rashes, other new lesions, no LE edema Psychiatric: Pt behavior is normal without agitation  All otherwise neg per pt Lab Results  Component Value Date   WBC 6.5 10/03/2019   HGB 14.8 10/03/2019   HCT 43.1 10/03/2019   PLT 243.0 10/03/2019   GLUCOSE 118 (H)  10/03/2019   CHOL 193 10/03/2019   TRIG 75.0 10/03/2019   HDL 68.20 10/03/2019   LDLCALC 110 (H) 10/03/2019   ALT 11 10/03/2019   AST 17 10/03/2019   NA 138 10/03/2019   K 4.5 10/03/2019   CL 103 10/03/2019   CREATININE 1.17 10/03/2019   BUN 16 10/03/2019   CO2 28 10/03/2019   TSH 1.18 10/03/2019   INR 2.16 (H) 05/15/2011   HGBA1C 6.5 10/03/2019   MICROALBUR <0.7 10/03/2019         Assessment & Plan:

## 2020-04-10 NOTE — Patient Instructions (Addendum)
Please remember to have your Phizer booster soon  You may wish to consider the shingles shot at the pharmacy  You will be contacted regarding the referral for: Physical Therapy  Your ears were irrigated of wax impactions today  Please continue all other medications as before, and refills have been done if requested.  Please have the pharmacy call with any other refills you may need.  Please continue your efforts at being more active, low cholesterol diet, and weight control.  Please keep your appointments with your specialists as you may have planned  Please make an Appointment to return in 6 months, or sooner if needed

## 2020-04-14 ENCOUNTER — Encounter: Payer: Self-pay | Admitting: Internal Medicine

## 2020-04-14 NOTE — Assessment & Plan Note (Signed)
Overall stable mild to mod chronic, declines any need for change in tx

## 2020-04-14 NOTE — Assessment & Plan Note (Signed)
Declines a1c or labs today, stable overall by history and exam, recent data reviewed with pt, and pt to continue medical treatment as before,  to f/u any worsening symptoms or concerns

## 2020-04-14 NOTE — Assessment & Plan Note (Signed)
Improved with irrigation 

## 2020-04-14 NOTE — Assessment & Plan Note (Signed)
stable overall by history and exam, recent data reviewed with pt, and pt to continue medical treatment as before,  to f/u any worsening symptoms or concerns, delcines lab f/u today

## 2020-04-14 NOTE — Assessment & Plan Note (Signed)
As above, may need walker

## 2020-04-14 NOTE — Assessment & Plan Note (Addendum)
Removed with irrigation and hearing improved,  to f/u any worsening symptoms or concerns  I spent 31 minutes in preparing to see the patient by review of recent labs, imaging and procedures, obtaining and reviewing separately obtained history, communicating with the patient and family or caregiver, ordering medications, tests or procedures, and documenting clinical information in the EHR including the differential Dx, treatment, and any further evaluation and other management of excessive cerumen, hearing loss, balance d/o, gait d/o, recurrent falls, b12 def, DM, anxiety, hypothyroidism

## 2020-04-14 NOTE — Assessment & Plan Note (Signed)
Mild worsening recently, may need walker soon, for PT referral eval and tx

## 2020-04-14 NOTE — Assessment & Plan Note (Signed)
Cont oral replacement 

## 2020-04-14 NOTE — Assessment & Plan Note (Signed)
For PT as above, declines other lab or referral today

## 2020-04-26 DIAGNOSIS — Z23 Encounter for immunization: Secondary | ICD-10-CM | POA: Diagnosis not present

## 2020-04-30 ENCOUNTER — Ambulatory Visit (INDEPENDENT_AMBULATORY_CARE_PROVIDER_SITE_OTHER): Payer: Medicare Other | Admitting: Ophthalmology

## 2020-04-30 ENCOUNTER — Other Ambulatory Visit: Payer: Self-pay

## 2020-04-30 ENCOUNTER — Encounter (INDEPENDENT_AMBULATORY_CARE_PROVIDER_SITE_OTHER): Payer: Self-pay | Admitting: Ophthalmology

## 2020-04-30 DIAGNOSIS — Z9889 Other specified postprocedural states: Secondary | ICD-10-CM

## 2020-04-30 DIAGNOSIS — H35372 Puckering of macula, left eye: Secondary | ICD-10-CM | POA: Diagnosis not present

## 2020-04-30 DIAGNOSIS — H353124 Nonexudative age-related macular degeneration, left eye, advanced atrophic with subfoveal involvement: Secondary | ICD-10-CM

## 2020-04-30 DIAGNOSIS — H353211 Exudative age-related macular degeneration, right eye, with active choroidal neovascularization: Secondary | ICD-10-CM

## 2020-04-30 MED ORDER — BEVACIZUMAB CHEMO INJECTION 1.25MG/0.05ML SYRINGE FOR KALEIDOSCOPE
1.2500 mg | INTRAVITREAL | Status: AC | PRN
  Administered 2020-04-30: 1.25 mg via INTRAVITREAL

## 2020-04-30 NOTE — Assessment & Plan Note (Signed)
Perifoveal ILM change Nasal to FAZ, not visually impactful

## 2020-04-30 NOTE — Patient Instructions (Signed)
Patient instructed to contact the office promptly for new onset visual acuity decline or distortion 

## 2020-04-30 NOTE — Assessment & Plan Note (Signed)
History of chronic active CNVM, recurrent multiple occasions beyond 7-week follow-up interval. Pete injection OD today in this monocular patient

## 2020-04-30 NOTE — Assessment & Plan Note (Signed)
Geographic atrophy subfoveal left eye accounts for the vision

## 2020-04-30 NOTE — Progress Notes (Signed)
04/30/2020     CHIEF COMPLAINT Patient presents for Retina Follow Up   HISTORY OF PRESENT ILLNESS: Stephen Wilkinson is a 86 y.o. male who presents to the clinic today for:   HPI    Retina Follow Up    Patient presents with  Wet AMD.  In right eye.  This started 8 weeks ago.  Severity is mild.  Duration of 8 weeks.  Since onset it is stable.          Comments    8 WK F/U, POSS AVASTIN OD   Stable vision, no new F/F, no pain         Last edited by Nichola Sizer D on 04/30/2020 10:52 AM. (History)      Referring physician: Biagio Borg, MD [place],  [place] [address]  HISTORICAL INFORMATION:   Selected notes from the MEDICAL RECORD NUMBER    Lab Results  Component Value Date   HGBA1C 6.5 10/03/2019     CURRENT MEDICATIONS: Current Outpatient Medications (Ophthalmic Drugs)  Medication Sig  . latanoprost (XALATAN) 0.005 % ophthalmic solution INSTILL 1 DROP INTO BOTH EYES AT BEDTIME   No current facility-administered medications for this visit. (Ophthalmic Drugs)   Current Outpatient Medications (Other)  Medication Sig  . amLODipine (NORVASC) 10 MG tablet Take 1 tablet (10 mg total) by mouth every morning.  Marland Kitchen aspirin 81 MG tablet Take 81 mg by mouth daily.  . Bevacizumab (AVASTIN IV) Place 1 drop into the right eye as directed. Every 6-8 weeks  . clobetasol cream (TEMOVATE) 2.02 % Apply 1 application topically daily as needed (for contact dermatitis). Contact dermatitis  . clopidogrel (PLAVIX) 75 MG tablet Take 1 tablet (75 mg total) by mouth daily.  . famotidine (PEPCID) 20 MG tablet Take 1 tablet (20 mg total) by mouth 2 (two) times daily.  Marland Kitchen FLUoxetine (PROZAC) 10 MG capsule Take 1 capsule (10 mg total) by mouth daily.  Marland Kitchen glucose blood (ONE TOUCH ULTRA TEST) test strip Use as directed once daily to check blood sugar.  Diagnosis code E11.42  . levothyroxine (SYNTHROID) 125 MCG tablet Take 1 tablet (125 mcg total) by mouth daily before breakfast.   . metFORMIN (GLUCOPHAGE) 1000 MG tablet Take 1 tablet (1,000 mg total) by mouth daily.  . metoprolol succinate (TOPROL-XL) 25 MG 24 hr tablet TAKE 1 TABLET EVERY MORNING  . Multiple Vitamins-Minerals (ICAPS AREDS 2 PO) Take 2 capsules by mouth daily.  Marland Kitchen tiZANidine (ZANAFLEX) 4 MG tablet Take 1 tablet (4 mg total) by mouth every 6 (six) hours as needed for muscle spasms.  . vitamin B-12 (CYANOCOBALAMIN) 1000 MCG tablet Take 1 tablet (1,000 mcg total) by mouth daily.   No current facility-administered medications for this visit. (Other)      REVIEW OF SYSTEMS:    ALLERGIES No Known Allergies  PAST MEDICAL HISTORY Past Medical History:  Diagnosis Date  . Acute blood loss anemia 05/15/2011   post surgical  . Altered mental status 05/15/2011  . Anxiety   . B12 deficiency 09/23/2016  . Blood transfusion without reported diagnosis   . Cancer ([place])    basal and squamous skin carcinoma   . Cataract   . Chronic kidney disease    hx of uti due to catheter hx of prostatitis, BPH -   . Depression   . Diabetes mellitus   . GERD (gastroesophageal reflux disease)   . Glaucoma 01/04/2013  . Heart murmur   . History of nuclear  stress test    Myoview 3/17: EF 62%, no ischemia or scar, low risk  . Hx of transient ischemic attack (TIA) 01/04/2013  . Hx: UTI (urinary tract infection)   . Hyperlipidemia   . Hypertension   . Hypokalemia 05/15/2011  . Hypothyroidism   . Insomnia   . Macular degeneration   . Neuropathy   . OA (osteoarthritis)   . Pneumonia   . PVC's (premature ventricular contractions)   . Renal mass   . Stroke ([place])    mid 1990s    Past Surgical History:  Procedure Laterality Date  . APPENDECTOMY    . CATARACT EXTRACTION W/ INTRAOCULAR LENS  IMPLANT, BILATERAL    . INGUINAL HERNIA REPAIR     X3  . LUMBAR LAMINECTOMY/DECOMPRESSION MICRODISCECTOMY N/A 02/12/2017   Procedure: Lumbar Three-Four, Lumbar Four-Five, Lumbar Five-Sacral One Laminectomy and Foraminotomy;   Surgeon: Earnie Larsson, MD;  Location: [facility];  Service: Neurosurgery;  Laterality: N/A;  . OTHER SURGICAL HISTORY     microwave procedure for prostate - 09/12   . PROSTATE ABLATION     microwave  . REPLACEMENT TOTAL KNEE    . THYROIDECTOMY    . TONSILLECTOMY AND ADENOIDECTOMY    . TOTAL KNEE ARTHROPLASTY  05/11/2011   Procedure: TOTAL KNEE ARTHROPLASTY;  Surgeon: Gearlean Alf;  Location: [facility];  Service: Orthopedics;  Laterality: Right;  . US ECHOCARDIOGRAPHY  11/10/2006   EF 55-60%  . US ECHOCARDIOGRAPHY  09/20/2002   EF 65-70%    FAMILY HISTORY Family History  Problem Relation Age of Onset  . Hypertension Mother   . Arthritis Mother   . Diabetes Mother   . Hypertension Father   . Stroke Father   . Kidney disease Father   . Hypertension Brother   . Diabetes Brother     SOCIAL HISTORY Social History   Tobacco Use  . Smoking status: Never Smoker  . Smokeless tobacco: Never Used  Vaping Use  . Vaping Use: Never used  Substance Use Topics  . Alcohol use: Yes    Alcohol/week: 7.0 standard drinks    Types: 7 Shots of liquor per week    Comment: 2 drinks most days  . Drug use: No         OPHTHALMIC EXAM: Base Eye Exam    Visual Acuity (ETDRS)      Right Left   Dist cc 20/30 +2 CF at 1'   Dist ph cc NI NI   Correction: Glasses       Tonometry (Tonopen, 10:53 AM)      Right Left   Pressure 21 17       Pupils      Pupils Dark Light Shape React APD   Right PERRL 3 2 Round Brisk None   Left PERRL 3 2 Round Brisk None       Visual Fields (Counting fingers)      Left Right    Full Full       Extraocular Movement      Right Left    Full Full       Neuro/Psych    Oriented x3: Yes   Mood/Affect: Normal       Dilation    Right eye: 1.0% Mydriacyl, 2.5% Phenylephrine @ 10:59 AM        Slit Lamp and Fundus Exam    External Exam      Right Left   External Normal Normal       Slit Lamp  Exam      Right Left   Lids/Lashes Normal Normal    Conjunctiva/Sclera White and quiet White and quiet   Cornea Clear Clear   Anterior Chamber Deep and quiet Deep and quiet   Iris Round and reactive Round and reactive   Lens Posterior chamber intraocular lens Posterior chamber intraocular lens   Anterior Vitreous Normal Normal       Fundus Exam      Right Left   Posterior Vitreous Normal    Disc Normal    C/D Ratio 0.2-0.3    Macula Advanced age related macular degeneration, Geographic atrophy, Hard drusen, no exudates, no clinical Subretinal neovascular membrane, no hemorrhage, Retinal pigment epithelial mottling    Vessels Normal    Periphery Normal           IMAGING AND PROCEDURES  Imaging and Procedures for 04/30/20  OCT, Retina - OU - Both Eyes       Right Eye Quality was good. Scan locations included subfoveal. Central Foveal Thickness: 229. Progression has been stable. Findings include outer retinal atrophy, central retinal atrophy, subretinal scarring.   Left Eye Quality was good. Scan locations included subfoveal. Central Foveal Thickness: 223. Progression has been stable. Findings include subretinal scarring, central retinal atrophy, outer retinal atrophy.   Notes OD, noncentral involved geographic atrophy, stable, less active CNVM, history of chronic recurrences       Intravitreal Injection, Pharmacologic Agent - OD - Right Eye       Time Out 04/30/2020. 11:28 AM. Confirmed correct patient, procedure, site, and patient consented.   Anesthesia Topical anesthesia was used. Anesthetic medications included Akten 3.5%.   Procedure Preparation included Ofloxacin , 10% betadine to eyelids, 5% betadine to ocular surface. A 30 gauge needle was used.   Injection:  1.25 mg Bevacizumab (AVASTIN) SOLN   NDC: 70360-001-02, Lot: [id number]   Route: Intravitreal, Site: Right Eye, Waste: 0 mg  Post-op Post injection exam found visual acuity of at least counting fingers. The patient tolerated the procedure well. There  were no complications. The patient received written and verbal post procedure care education. Post injection medications were not given.                 ASSESSMENT/PLAN:  Advanced nonexudative age-related macular degeneration of left eye with subfoveal involvement Geographic atrophy subfoveal left eye accounts for the vision  Exudative age-related macular degeneration of right eye with active choroidal neovascularization (HCC) History of chronic active CNVM, recurrent multiple occasions beyond 7-week follow-up interval. Pete injection OD today in this monocular patient  Epiretinal membrane, left eye Perifoveal ILM change Nasal to FAZ, not visually impactful      ICD-10-CM   1. Exudative age-related macular degeneration of right eye with active choroidal neovascularization (HCC)  H35.3211 OCT, Retina - OU - Both Eyes    Intravitreal Injection, Pharmacologic Agent - OD - Right Eye    Bevacizumab (AVASTIN) SOLN 1.25 mg  2. Advanced nonexudative age-related macular degeneration of left eye with subfoveal involvement  H35.3124   3. Epiretinal membrane, left eye  H35.372   4. History of vitrectomy  Z98.890     1. Repeat intravitreal Avastin today, at 7-week interval, due to history of multiple recurrences and monocular status  2. No progression of subfoveal atrophy OU  3. Minor epiretinal membrane OS  Ophthalmic Meds Ordered this visit:  Meds ordered this encounter  Medications  . Bevacizumab (AVASTIN) SOLN 1.25 mg       Return in about  7 weeks (around 06/18/2020) for dilate, OD, AVASTIN OCT.  Patient Instructions  Patient instructed to contact the office promptly for new onset visual acuity decline or distortion    Explained the diagnoses, plan, and follow up with the patient and they expressed understanding.  Patient expressed understanding of the importance of proper follow up care.   Clent Demark Abrianna Sidman M.D. Diseases & Surgery of the Retina and Vitreous [facility]  [facility] [place] 04/30/20     Abbreviations: M myopia (nearsighted); A astigmatism; H hyperopia (farsighted); P presbyopia; Mrx spectacle prescription;  CTL contact lenses; OD right eye; OS left eye; OU both eyes  XT exotropia; ET esotropia; PEK punctate epithelial keratitis; PEE punctate epithelial erosions; DES dry eye syndrome; MGD meibomian gland dysfunction; ATs artificial tears; PFAT's preservative free artificial tears; [place] nuclear sclerotic cataract; PSC posterior subcapsular cataract; ERM epi-retinal membrane; PVD posterior vitreous detachment; RD retinal detachment; DM diabetes mellitus; DR diabetic retinopathy; NPDR non-proliferative diabetic retinopathy; PDR proliferative diabetic retinopathy; CSME clinically significant macular edema; DME diabetic macular edema; dbh dot blot hemorrhages; CWS cotton wool spot; POAG primary open angle glaucoma; C/D cup-to-disc ratio; HVF humphrey visual field; GVF goldmann visual field; OCT optical coherence tomography; IOP intraocular pressure; BRVO Branch retinal vein occlusion; CRVO central retinal vein occlusion; CRAO central retinal artery occlusion; BRAO branch retinal artery occlusion; RT retinal tear; SB scleral buckle; PPV pars plana vitrectomy; VH Vitreous hemorrhage; PRP panretinal laser photocoagulation; IVK intravitreal kenalog; VMT vitreomacular traction; MH Macular hole;  NVD neovascularization of the disc; NVE neovascularization elsewhere; AREDS age related eye disease study; ARMD age related macular degeneration; POAG primary open angle glaucoma; EBMD epithelial/anterior basement membrane dystrophy; ACIOL anterior chamber intraocular lens; IOL intraocular lens; PCIOL posterior chamber intraocular lens; Phaco/IOL phacoemulsification with intraocular lens placement; [place] photorefractive keratectomy; LASIK laser assisted in situ keratomileusis; HTN hypertension; DM diabetes mellitus; COPD chronic obstructive pulmonary disease

## 2020-05-02 ENCOUNTER — Other Ambulatory Visit: Payer: Self-pay

## 2020-05-02 ENCOUNTER — Ambulatory Visit: Payer: Medicare Other | Attending: Internal Medicine | Admitting: Physical Therapy

## 2020-05-02 DIAGNOSIS — M25652 Stiffness of left hip, not elsewhere classified: Secondary | ICD-10-CM | POA: Insufficient documentation

## 2020-05-02 DIAGNOSIS — R296 Repeated falls: Secondary | ICD-10-CM | POA: Diagnosis not present

## 2020-05-02 DIAGNOSIS — M6281 Muscle weakness (generalized): Secondary | ICD-10-CM | POA: Insufficient documentation

## 2020-05-02 DIAGNOSIS — M25651 Stiffness of right hip, not elsewhere classified: Secondary | ICD-10-CM | POA: Insufficient documentation

## 2020-05-02 DIAGNOSIS — R2681 Unsteadiness on feet: Secondary | ICD-10-CM | POA: Diagnosis not present

## 2020-05-02 DIAGNOSIS — R262 Difficulty in walking, not elsewhere classified: Secondary | ICD-10-CM | POA: Insufficient documentation

## 2020-05-02 NOTE — Patient Instructions (Signed)
° ° ° °  Stephen Wilkinson, PT, [place] Certified Exercise Expert for the Aging Adult  05/02/20 12:13 PM Phone: [phone] Fax: [phone]

## 2020-05-02 NOTE — Therapy (Addendum)
[place], [place], [address] Phone: [phone]   Fax:  [phone]  Physical Therapy Evaluation/Discharge Note  Patient Details  Name: Stephen Wilkinson MRN: [id number] Date of Birth: 01-09-1929 Referring Provider (PT): Biagio Borg MD   Encounter Date: 05/02/2020   PT End of Session - 05/02/20 1244     Visit Number 1    Number of Visits 24    Date for PT Re-Evaluation 07/25/20    Authorization Type MCR   Progress note on 10th visit    PT Start Time 1100    PT Stop Time 1215    PT Time Calculation (min) 75 min    Activity Tolerance Patient tolerated treatment well;Patient limited by fatigue    Behavior During Therapy [facility] for tasks assessed/performed             Past Medical History:  Diagnosis Date   Acute blood loss anemia 05/15/2011   post surgical   Altered mental status 05/15/2011   Anxiety    B12 deficiency 09/23/2016   Blood transfusion without reported diagnosis    Cancer ([place])    basal and squamous skin carcinoma    Cataract    Chronic kidney disease    hx of uti due to catheter hx of prostatitis, BPH -    Depression    Diabetes mellitus    GERD (gastroesophageal reflux disease)    Glaucoma 01/04/2013   Heart murmur    History of nuclear stress test    Myoview 3/17: EF 62%, no ischemia or scar, low risk   Hx of transient ischemic attack (TIA) 01/04/2013   Hx: UTI (urinary tract infection)    Hyperlipidemia    Hypertension    Hypokalemia 05/15/2011   Hypothyroidism    Insomnia    Macular degeneration    Neuropathy    OA (osteoarthritis)    Pneumonia    PVC's (premature ventricular contractions)    Renal mass    Stroke ([place])    mid 1990s     Past Surgical History:  Procedure Laterality Date   APPENDECTOMY     CATARACT EXTRACTION W/ INTRAOCULAR LENS  IMPLANT, BILATERAL     INGUINAL HERNIA REPAIR     X3   LUMBAR LAMINECTOMY/DECOMPRESSION MICRODISCECTOMY N/A 02/12/2017    Procedure: Lumbar Three-Four, Lumbar Four-Five, Lumbar Five-Sacral One Laminectomy and Foraminotomy;  Surgeon: Earnie Larsson, MD;  Location: [place];  Service: Neurosurgery;  Laterality: N/A;   OTHER SURGICAL HISTORY     microwave procedure for prostate - 09/12    PROSTATE ABLATION     microwave   REPLACEMENT TOTAL KNEE     THYROIDECTOMY     TONSILLECTOMY AND ADENOIDECTOMY     TOTAL KNEE ARTHROPLASTY  05/11/2011   Procedure: TOTAL KNEE ARTHROPLASTY;  Surgeon: Gearlean Alf;  [facility];  Service: Orthopedics;  Laterality: Right;   US ECHOCARDIOGRAPHY  11/10/2006   EF 55-60%   US ECHOCARDIOGRAPHY  09/20/2002   EF 65-70%    There were no vitals filed for this visit.    Subjective Assessment - 05/02/20 1104     Subjective I have multiple falls.  I was helping my wife take out meat from the oven I fell and gractured my back because I passed out and fell  compression fx L2. I used to be very active and I am disturbed that I am more tired and I cant do what I used to do. I can get out of  the tub because of the pull bars. I do not exercise at all.    Pertinent History 2018 Back surgery L-3 to L-5 laminectomy, Dr  Trenton Gammon, TKA R and L 2012 and 2012 Dr Maureen Ralphs  Macular Degeneration.  Heart Murmur Multiple medical issues, Hypothyroid   See Medical History    Currently in Pain? Yes    Pain Score 4     Pain Location Back    Pain Orientation Mid;Lower;Left    Pain Descriptors / Indicators Aching    Pain Radiating Towards sometimes radiates down the ankle    Pain Onset More than a month ago    Pain Frequency Intermittent                [facility] PT Assessment - 05/02/20 0001       Assessment   Medical Diagnosis gait disorder, balance disorder,      Referring Provider (PT) Biagio Borg MD      Precautions   Precautions Fall    Precaution Comments Blind in LT eye and macular degeneration in RT eye  falls      Balance Screen   Has the patient fallen in the past 6 months Yes    How many  times? 6   pulling meat out of oven  , backyard unlevel.    Has the patient had a decrease in activity level because of a fear of falling?  Yes    Is the patient reluctant to leave their home because of a fear of falling?  Yes      [place] Private residence    Living Arrangements Spouse/significant other    Type of [place] Access Level entry    [place] One level    Additional Comments back steps 8 steps and has trouble falling in back yard      Cognition   Overall Cognitive Status Within Functional Limits for tasks assessed      Observation/Other Assessments   Focus on Therapeutic Outcomes (FOTO)  Intake 46%  predicted 43%      Functional Tests   Functional tests Sit to Stand      Squat   Comments SBAx 1  unsteady as descending 1/2 range      Sit to Stand   Comments 29.34 5 x STS      Posture/Postural Control   Posture/Postural Control Postural limitations    Postural Limitations Rounded Shoulders;Forward head;Posterior pelvic tilt      ROM / Strength   AROM / PROM / Strength AROM;Strength      AROM   Overall AROM Comments shoulder WFL    Right Hip Flexion 110    Right Hip External Rotation  45    Right Hip Internal Rotation  19    Left Hip Flexion 104    Left Hip External Rotation  40    Left Hip Internal Rotation  15    Right Knee Extension -5    Right Knee Flexion 125    Left Knee Extension -5    Left Knee Flexion 120    Lumbar Flexion 75%    Lumbar Extension 25%    Lumbar - Right Side Bend 50%    Lumbar - Left Side Bend 50%    Lumbar - Right Rotation 50%    Lumbar - Left Rotation 50%      Strength   Overall Strength Deficits    Right Hip Flexion 4/5  Right Hip Extension 3-/5    Right Hip ABduction 3-/5    Left Hip Flexion 4/5    Left Hip Extension 3-/5    Left Hip ABduction 3-/5    Right Knee Flexion 4/5    Right Knee Extension 4/5    Left Knee Flexion 4/5    Left Knee Extension 4/5      Palpation    Palpation comment TTP over bil Lumber L-1 to L-4 paraspinals  and over bilateral hips      Ambulation/Gait   Ambulation/Gait Yes    Assistive device Straight cane    Gait Pattern Decreased hip/knee flexion - right;Decreased hip/knee flexion - left;Decreased stride length;Step-to pattern;Trunk flexed;Poor foot clearance - left;Poor foot clearance - right;Wide base of support    Ambulation Surface Level    Gait velocity 1.82 ft/sec    Gait Comments Pt is fearful of falling and not wanting to negotiate steps due to fear and weakness in LE      Standardized Balance Assessment   Standardized Balance Assessment Berg Balance Test      Berg Balance Test   Sit to Stand Able to stand  independently using hands    Standing Unsupported Able to stand safely 2 minutes    Sitting with Back Unsupported but Feet Supported on Floor or Stool Able to sit safely and securely 2 minutes    Stand to Sit Controls descent by using hands    Transfers Able to transfer safely, definite need of hands    Standing Unsupported with Eyes Closed Able to stand 10 seconds with supervision    Standing Unsupported with Feet Together Able to place feet together independently and stand for 1 minute with supervision    From Standing, Reach Forward with Outstretched Arm Can reach forward >12 cm safely (5")    From Standing Position, Pick up Object from Floor Able to pick up shoe, needs supervision    From Standing Position, Turn to Look Behind Over each Shoulder Looks behind one side only/other side shows less weight shift    Turn 360 Degrees Able to turn 360 degrees safely one side only in 4 seconds or less    Standing Unsupported, Alternately Place Feet on Step/Stool Needs assistance to keep from falling or unable to try    Standing Unsupported, One Foot in [employer] balance while stepping or standing    Standing on One Leg Tries to lift leg/unable to hold 3 seconds but remains standing independently    Total Score 36    Berg  comment: 36 out of 56 requires an assistive device walker most stable now      Timed Up and Go Test   TUG Normal TUG    Normal TUG (seconds) 13.45    TUG Comments 4 x greater risk of fall > 12.6 sec                        Objective measurements completed on examination: See above findings.       [facility] Adult PT Treatment/Exercise - 05/02/20 0001       Ambulation/Gait   Ambulation Distance (Feet) 100 Feet    Stairs --   fearful     Self-Care   Self-Care Other Self-Care Comments    Other Self-Care Comments  initial fall prevention             Gave out initial There Ex with sit to stand ( sink squat with chair and  heel raise)        PT Education - 05/02/20 1301     Education Details POC Explanation of findings, initial fall prevention, intiial HEP,  gait training( need for walker due to balance.  BERG 36/56    Person(s) Educated Patient    Methods Explanation;Demonstration;Tactile cues;Verbal cues;Handout    Comprehension Verbalized understanding;Returned demonstration              PT Short Term Goals - 05/02/20 1302       PT SHORT TERM GOAL #1   Title Pt will be independent with initial HEP    Baseline no knowledge    Time 4    Period Weeks    Status New    Target Date 05/30/20      PT SHORT TERM GOAL #2   Title Pt will be educated on fall prevention and list 3 strategies and consistently use walker for safety    Baseline pt using SPC but with balance deficits    Time 4    Period Weeks    Status New    Target Date 05/30/20      PT SHORT TERM GOAL #3   Title Pt will increase gait speed to 2.25 ft/sec from eval using walker    Baseline Pt eval 1.82 ft/sec    Time 4    Period Weeks    Status New    Target Date 05/30/20      PT SHORT TERM GOAL #4   Title Pt  5 x sts to be improved to at least 20 sec to show improved LE strength    Baseline 29.34 sec 5 x STS    Time 4    Period Weeks    Status New    Target Date 05/30/20                PT Long Term Goals - 05/02/20 1313       PT LONG TERM GOAL #1   Title Pt will be independent with advanced HEP exercises given    Baseline no knowledge    Time 12    Period Weeks    Status New    Target Date 07/25/20      PT LONG TERM GOAL #2   Title Pt will be able to negotiate steps independently with alternating steps with knowledge of safety and fall prevention    Baseline Pt fearful and avoids steps    Time 12    Period Weeks    Status New    Target Date 07/25/20      PT LONG TERM GOAL #3   Title Pt will increase gait velocity to at least 2.62 ft/sec with least restrictive device    Baseline eval 1.82 ft/sec    Time 12    Period Weeks    Status New    Target Date 07/25/20      PT LONG TERM GOAL #4   Title Pt will improve BERG balance from 36 to at least 42 to show improvement in balance and ability to use least restrictive AD safely    Baseline BERG eval 36/56    Time 12    Period Weeks    Status New    Target Date 07/25/20      PT LONG TERM GOAL #5   Title Pt will be able to perform fall preparedness and demonstratie floor to stand transfer with Supervision    Baseline pt fearful of falling or getting up from  floor    Time 12    Period Weeks    Status New    Target Date 07/25/20      Additional Long Term Goals   Additional Long Term Goals Yes      PT LONG TERM GOAL #6   Title Pt will be able to demonstrate walking over level and unlevel surfaces using LRAD with no loss of balance over 500 feet    Baseline pt utilizes [facility] with balance deficit and repeated falls and fear of falling verbalized    Time 12    Period Weeks    Status New    Target Date 07/25/20                    Plan - 05/02/20 1212     Clinical Impression Statement Mr Sachse presents to clinic using [facility] and with obvious balance deficits with wide base gait and noticeable fear or movement/ falling. during evaluation. Pt is also blind in LT eye and with RT macular  degeneration. Pt reports being fearful of falling and avoiding stairs and hilly backyard.  He reports falling 6 x in past 6 months.  Pt fell while helping wife retrieve meat out of oven in June 2021 and had subacute superior endplate compression fracture of L2 with vertebral body height loss of up to 50%.  Mr Weinert has pain in low back at times 4/10 but is most affected by balance and difficulty walking.  He has not been involved in any exercise for the past 2-3 years and would like to increase strength of safety and fall prevention.  Pt was educated on need for walker at this time for safety given his BERG score 36/56 and TUG scores over 13 sec which makes Mr Talton at high risk of falls.  Pt will benefit form skilled PT for impairments below to increase strength and be better prepared, reduce fear of falling and avoid falls in home.    Personal Factors and Comorbidities Comorbidity 1;Comorbidity 3+    Comorbidities 2018 Back surgery PLIF L-2 to S-1 laminectomy, Dr  Trenton Gammon, TKA R and L 2012 and 2012 Dr Maureen Ralphs  Macular Degeneration. DM,  Repeated falls  Heart Murmur Multiple medical issues, Hypothyroid blind in LT eye macular degeneration RT repeated falls/   See Medical History    Examination-Activity Limitations Dressing;Lift;Locomotion Level;Squat;Stairs;Stand;Transfers    Examination-Participation Restrictions Community Activity;Laundry;Yard Work    Biomedical scientist High    Rehab Potential Good    PT Frequency 2x / week    PT Duration 12 weeks    PT Treatment/Interventions ADLs/Self Care Home Management;Aquatic Therapy;Cryotherapy;Electrical Stimulation;Iontophoresis 4mg /ml Dexamethasone;Moist Heat;DME Instruction;Gait training;Stair training;Functional mobility training;Therapeutic activities;Therapeutic exercise;Balance training;Neuromuscular re-education;Patient/family education;Passive range of motion;Manual techniques;Dry  needling;Taping;Vestibular;Spinal Manipulations;Joint Manipulations    PT Next Visit Plan Given HEP for sit to stand and heel raise,    PT Home Exercise Plan [email or web address]             Patient will benefit from skilled therapeutic intervention in order to improve the following deficits and impairments:  Pain, Postural dysfunction, Improper body mechanics, Impaired vision/preception, Decreased activity tolerance, Decreased balance, Decreased mobility, Decreased knowledge of use of DME, Decreased endurance, Decreased range of motion, Decreased safety awareness, Decreased strength, Hypomobility, Difficulty walking, Increased muscle spasms  Visit Diagnosis: Unsteadiness on feet  Difficulty in walking, not elsewhere classified  Muscle weakness (generalized)  Repeated falls  Stiffness of left hip, not elsewhere  classified  Stiffness of right hip, not elsewhere classified   Access Code: [email or web address]: [email or web address]: 11/18/2021Prepared by: Thornton Dales  Squat with Chair Touch - 1 x daily - 7 x weekly - 3 sets - 20 reps  Heel Toe Raises with Counter Support - 1 x daily - 7 x weekly - 3 sets - 10 reps    Problem List Patient Active Problem List   Diagnosis Date Noted   Excessive cerumen in both ear canals 04/10/2020   History of vitrectomy 12/04/2019   Bilateral hip pain 11/26/2019   Tremor 11/26/2019   Low back pain 11/23/2019   Gait disorder 11/23/2019   Recurrent falls 11/23/2019   General weakness 11/23/2019   Exudative age-related macular degeneration of right eye with active choroidal neovascularization ([place]) 10/16/2019   Advanced nonexudative age-related macular degeneration of left eye with subfoveal involvement 10/16/2019   Advanced nonexudative age-related macular degeneration of right eye without subfoveal involvement 10/16/2019   [place], vitreous, right 10/16/2019   Epiretinal membrane, left eye 10/16/2019   Hallucination 02/21/2019    Balance disorder 09/30/2018   Dizziness 09/30/2018   Anxiety 09/25/2017   Lumbar stenosis with neurogenic claudication 02/12/2017   Lumbar spinal stenosis 11/24/2016   Left sided sciatica 11/24/2016   Renal cyst 11/24/2016   Bilateral leg cramps 09/23/2016   Nasal sore 09/23/2016   B12 deficiency 09/23/2016   CMC arthritis, thumb, degenerative 05/26/2016   Memory dysfunction 03/24/2016   Rash and nonspecific skin eruption 11/20/2014   Cough 10/10/2014   Sciatica of left side 08/21/2014   GERD (gastroesophageal reflux disease) 01/04/2014   Hearing loss, bilateral 10/18/2013   Wrist arthritis 10/18/2013   Preventative health care 03/30/2013   Impaired vision in both eyes 03/30/2013   Erectile dysfunction 03/30/2013   Skin cancer    Hyperlipidemia    Chronic cough 03/10/2013   First degree heart block 01/04/2013   DM type 2 with diabetic peripheral neuropathy ([place]) 01/04/2013   BPH (benign prostatic hyperplasia) 01/04/2013   Hx of transient ischemic attack (TIA) 01/04/2013   Glaucoma 01/04/2013   Osteoarthritis of right knee 05/12/2011   Hypothyroidism 12/25/2010   Benign hypertensive heart disease without heart failure 12/25/2010   Anxiety and depression 12/25/2010   Macular degeneration of both eyes 12/25/2010    Voncille Lo, PT, [place] Certified Exercise Expert for the Aging Adult  05/02/20 1:37 PM Phone: [phone] Fax: [facility] [address] [place], [place], [address] Phone: [phone]   Fax:  [phone]  Name: Stephen Wilkinson MRN: [id number] Date of Birth: [birth year]  PHYSICAL THERAPY DISCHARGE SUMMARY  Visits from Start of Care: 1  Current functional level related to goals / functional outcomes: unknown   Remaining deficits: Same as above , pt called to confirm DC   Education / Equipment: Initial DC   Patient agrees to discharge. Patient goals were not met. Patient is being  discharged due to not returning since the last visit.  Pt called and he decided to DC due to turning 86 yo and he has other medical issues and he does not want to pursue PT at this time.   Voncille Lo, PT, [place] Certified Exercise Expert for the Aging Adult  04/22/21 2:39 PM Phone: [phone] Fax: [phone]

## 2020-05-17 NOTE — Progress Notes (Signed)
HPI: Follow-up hypertension and palpitations. Also history of TIA. Nuclear study March 2017 showed ejection fraction 62% and no ischemia.Carotid Dopplers May 2020 showed 1 to 39% bilateral stenosis. Question episodes of syncope at previous ov. Echo 4/21 showed normal LV function; grade 2 DD; trace AI and mild AS with mean gradient 12 mmHg. Monitor 4/21 showed sinus with PACs, PVCs brief PAT but no pauses. Since last seen, he denies dyspnea, chest pain, palpitations or syncope.  He is having difficulties with falls due to imbalance.  Current Outpatient Medications  Medication Sig Dispense Refill  . amLODipine (NORVASC) 10 MG tablet Take 1 tablet (10 mg total) by mouth every morning. 90 tablet 3  . aspirin 81 MG tablet Take 81 mg by mouth daily.    . Bevacizumab (AVASTIN IV) Place 1 drop into the right eye as directed. Every 6-8 weeks    . clobetasol cream (TEMOVATE) 7.06 % Apply 1 application topically daily as needed (for contact dermatitis). Contact dermatitis  3  . clopidogrel (PLAVIX) 75 MG tablet Take 1 tablet (75 mg total) by mouth daily. 90 tablet 3  . famotidine (PEPCID) 20 MG tablet Take 1 tablet (20 mg total) by mouth 2 (two) times daily. 180 tablet 3  . FLUoxetine (PROZAC) 10 MG capsule Take 1 capsule (10 mg total) by mouth daily. 90 capsule 3  . glucose blood (ONE TOUCH ULTRA TEST) test strip Use as directed once daily to check blood sugar.  Diagnosis code E11.42 100 each 11  . latanoprost (XALATAN) 0.005 % ophthalmic solution INSTILL 1 DROP INTO BOTH EYES AT BEDTIME 2.5 mL 12  . levothyroxine (SYNTHROID) 125 MCG tablet Take 1 tablet (125 mcg total) by mouth daily before breakfast. 90 tablet 3  . metFORMIN (GLUCOPHAGE) 1000 MG tablet Take 1 tablet (1,000 mg total) by mouth daily. 90 tablet 3  . metoprolol succinate (TOPROL-XL) 25 MG 24 hr tablet TAKE 1 TABLET EVERY MORNING 90 tablet 2  . Multiple Vitamins-Minerals (ICAPS AREDS 2 PO) Take 2 capsules by mouth daily.    Marland Kitchen  tiZANidine (ZANAFLEX) 4 MG tablet Take 1 tablet (4 mg total) by mouth every 6 (six) hours as needed for muscle spasms. 360 tablet 3  . vitamin B-12 (CYANOCOBALAMIN) 1000 MCG tablet Take 1 tablet (1,000 mcg total) by mouth daily. 90 tablet 3   No current facility-administered medications for this visit.     Past Medical History:  Diagnosis Date  . Acute blood loss anemia 05/15/2011   post surgical  . Altered mental status 05/15/2011  . Anxiety   . B12 deficiency 09/23/2016  . Blood transfusion without reported diagnosis   . Cancer ([place])    basal and squamous skin carcinoma   . Cataract   . Chronic kidney disease    hx of uti due to catheter hx of prostatitis, BPH -   . Depression   . Diabetes mellitus   . GERD (gastroesophageal reflux disease)   . Glaucoma 01/04/2013  . Heart murmur   . History of nuclear stress test    Myoview 3/17: EF 62%, no ischemia or scar, low risk  . Hx of transient ischemic attack (TIA) 01/04/2013  . Hx: UTI (urinary tract infection)   . Hyperlipidemia   . Hypertension   . Hypokalemia 05/15/2011  . Hypothyroidism   . Insomnia   . Macular degeneration   . Neuropathy   . OA (osteoarthritis)   . Pneumonia   . PVC's (premature ventricular contractions)   .  Renal mass   . Stroke ([place])    mid 1990s     Past Surgical History:  Procedure Laterality Date  . APPENDECTOMY    . CATARACT EXTRACTION W/ INTRAOCULAR LENS  IMPLANT, BILATERAL    . INGUINAL HERNIA REPAIR     X3  . LUMBAR LAMINECTOMY/DECOMPRESSION MICRODISCECTOMY N/A 02/12/2017   Procedure: Lumbar Three-Four, Lumbar Four-Five, Lumbar Five-Sacral One Laminectomy and Foraminotomy;  Surgeon: Earnie Larsson, MD;  Location: [place];  Service: Neurosurgery;  Laterality: N/A;  . OTHER SURGICAL HISTORY     microwave procedure for prostate - 09/12   . PROSTATE ABLATION     microwave  . REPLACEMENT TOTAL KNEE    . THYROIDECTOMY    . TONSILLECTOMY AND ADENOIDECTOMY    . TOTAL KNEE ARTHROPLASTY  05/11/2011    Procedure: TOTAL KNEE ARTHROPLASTY;  Surgeon: Gearlean Alf;  [facility];  Service: Orthopedics;  Laterality: Right;  . US ECHOCARDIOGRAPHY  11/10/2006   EF 55-60%  . US ECHOCARDIOGRAPHY  09/20/2002   EF 65-70%    Social History   Socioeconomic History  . Marital status: Married    Spouse name: Not on file  . Number of children: 3  . Years of education: Not on file  . Highest education level: Not on file  Occupational History  . Occupation: retired    Fish farm manager: RETIRED    Comment: [employer]  Tobacco Use  . Smoking status: Never Smoker  . Smokeless tobacco: Never Used  Vaping Use  . Vaping Use: Never used  Substance and Sexual Activity  . Alcohol use: Yes    Alcohol/week: 7.0 standard drinks    Types: 7 Shots of liquor per week    Comment: 2 drinks most days  . Drug use: No  . Sexual activity: Never    Birth control/protection: None  Other Topics Concern  . 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 Hypertension Mother   . Arthritis Mother   . Diabetes Mother   . Hypertension Father   . Stroke Father   . Kidney disease Father   . Hypertension Brother   . Diabetes Brother     ROS: Decreased vision and falls but no fevers or chills, productive cough, hemoptysis, dysphasia, odynophagia, melena, hematochezia, dysuria, hematuria, rash, seizure activity, orthopnea, PND, pedal edema, claudication. Remaining systems are negative.  Physical  Exam: Well-developed well-nourished in no acute distress.  Skin is warm and dry.  HEENT is normal.  Neck is supple.  Chest is clear to auscultation with normal expansion.  Cardiovascular exam is regular rate and rhythm.  Abdominal exam nontender or distended. No masses palpated. Extremities show no edema. neuro grossly intact  ECG-sinus bradycardia at a rate of 57, cannot rule out septal infarct.  Personally reviewed  A/P  1 question history of syncope-previous evaluation  unremarkable.  No recurrences.  2 hypertension-patient's blood pressure is controlled.  Continue present medical regimen.  3 hyperlipidemia-managed by primary care.  4 aortic stenosis-mild on most recent echocardiogram.  5 history of palpitations-continue beta-blocker at present dose.  6 history of TIA-continue aspirin and Plavix.  Kirk Ruths, MD

## 2020-05-21 ENCOUNTER — Telehealth: Payer: Self-pay | Admitting: Physical Therapy

## 2020-05-21 NOTE — Telephone Encounter (Signed)
Pt called to check on cancellation and report of fall.  Pt is interested in continuing PT but he needs a couple of weeks to recover.  He has checked with his doctor and he feels like he will be ready to resume after Christmas.   Will continue with POC when pt is able to return.   Voncille Lo, PT, [place] Certified [facility] Expert for the Aging Adult  05/21/20 2:01 PM Phone: [phone] Fax: [phone]

## 2020-05-27 ENCOUNTER — Encounter: Payer: Medicare Other | Admitting: Physical Therapy

## 2020-05-28 ENCOUNTER — Other Ambulatory Visit: Payer: Self-pay

## 2020-05-28 ENCOUNTER — Encounter: Payer: Self-pay | Admitting: Cardiology

## 2020-05-28 ENCOUNTER — Ambulatory Visit (INDEPENDENT_AMBULATORY_CARE_PROVIDER_SITE_OTHER): Payer: Medicare Other | Admitting: Cardiology

## 2020-05-28 VITALS — BP 138/70 | HR 57 | Ht 73.0 in | Wt 178.6 lb

## 2020-05-28 DIAGNOSIS — E7800 Pure hypercholesterolemia, unspecified: Secondary | ICD-10-CM

## 2020-05-28 DIAGNOSIS — I10 Essential (primary) hypertension: Secondary | ICD-10-CM

## 2020-05-28 DIAGNOSIS — R55 Syncope and collapse: Secondary | ICD-10-CM | POA: Diagnosis not present

## 2020-05-28 DIAGNOSIS — R002 Palpitations: Secondary | ICD-10-CM

## 2020-05-28 DIAGNOSIS — I350 Nonrheumatic aortic (valve) stenosis: Secondary | ICD-10-CM | POA: Diagnosis not present

## 2020-05-28 MED ORDER — AMLODIPINE BESYLATE 10 MG PO TABS
10.0000 mg | ORAL_TABLET | ORAL | 3 refills | Status: DC
Start: 2020-05-28 — End: 2020-08-13

## 2020-05-28 NOTE — Patient Instructions (Signed)

## 2020-05-29 ENCOUNTER — Encounter: Payer: Medicare Other | Admitting: Physical Therapy

## 2020-06-03 ENCOUNTER — Other Ambulatory Visit: Payer: Self-pay | Admitting: Internal Medicine

## 2020-06-03 ENCOUNTER — Encounter: Payer: Medicare Other | Admitting: Physical Therapy

## 2020-06-03 NOTE — Telephone Encounter (Signed)
Please refill as per office routine med refill policy (all routine meds refilled for 3 mo or monthly per pt preference up to one year from last visit, then month to month grace period for 3 mo, then further med refills will have to be denied)  

## 2020-06-05 ENCOUNTER — Encounter: Payer: Medicare Other | Admitting: Physical Therapy

## 2020-06-10 ENCOUNTER — Encounter: Payer: Medicare Other | Admitting: Physical Therapy

## 2020-06-12 ENCOUNTER — Encounter: Payer: Medicare Other | Admitting: Physical Therapy

## 2020-06-18 ENCOUNTER — Encounter (INDEPENDENT_AMBULATORY_CARE_PROVIDER_SITE_OTHER): Payer: Self-pay | Admitting: Ophthalmology

## 2020-06-18 ENCOUNTER — Other Ambulatory Visit: Payer: Self-pay

## 2020-06-18 ENCOUNTER — Ambulatory Visit (INDEPENDENT_AMBULATORY_CARE_PROVIDER_SITE_OTHER): Payer: Medicare Other | Admitting: Ophthalmology

## 2020-06-18 DIAGNOSIS — H353211 Exudative age-related macular degeneration, right eye, with active choroidal neovascularization: Secondary | ICD-10-CM | POA: Diagnosis not present

## 2020-06-18 DIAGNOSIS — H353124 Nonexudative age-related macular degeneration, left eye, advanced atrophic with subfoveal involvement: Secondary | ICD-10-CM | POA: Diagnosis not present

## 2020-06-18 MED ORDER — BEVACIZUMAB 2.5 MG/0.1ML IZ SOSY
2.5000 mg | PREFILLED_SYRINGE | INTRAVITREAL | Status: AC | PRN
Start: 2020-06-18 — End: 2020-06-18
  Administered 2020-06-18: 2.5 mg via INTRAVITREAL

## 2020-06-18 NOTE — Assessment & Plan Note (Signed)
History of multiple recurrences right eye, CNVM, stable only at 7-week interval.  Repeat injection OD today and examination again in 7 weeks

## 2020-06-18 NOTE — Assessment & Plan Note (Signed)
Graphic after beginning to encroach upon fovea subfoveal involvement of geographic atrophy subfoveal involvement of CNVM geographic atrophy is stable

## 2020-06-18 NOTE — Progress Notes (Signed)
06/18/2020     CHIEF COMPLAINT Patient presents for Retina Follow Up (7 WK FU OD, POSS AVASTIN OD ////Pt reports stable vision OD, no new F/F OD, no pain or pressure OD. ////Last A1C: 6.8 yesterday////Last BS: 127 2 days ago. )   HISTORY OF PRESENT ILLNESS: Stephen Wilkinson is a 87 y.o. male who presents to the clinic today for:   HPI    Retina Follow Up    Patient presents with  Wet AMD.  In right eye.  This started 7 weeks ago.  Severity is mild.  Duration of 7 weeks.  Since onset it is stable. Additional comments: 7 WK FU OD, POSS AVASTIN OD     Pt reports stable vision OD, no new F/F OD, no pain or pressure OD.     Last A1C: 6.8 yesterday    Last BS: 127 2 days ago.        Last edited by Varney Biles D on 06/18/2020 10:30 AM. (History)      Referring physician: Corwin Levins, MD [address] [place],  [place] [address]  HISTORICAL INFORMATION:   Selected notes from the MEDICAL RECORD NUMBER    Lab Results  Component Value Date   HGBA1C 6.5 10/03/2019     CURRENT MEDICATIONS: Current Outpatient Medications (Ophthalmic Drugs)  Medication Sig  . latanoprost (XALATAN) 0.005 % ophthalmic solution INSTILL 1 DROP INTO BOTH EYES AT BEDTIME   No current facility-administered medications for this visit. (Ophthalmic Drugs)   Current Outpatient Medications (Other)  Medication Sig  . amLODipine (NORVASC) 10 MG tablet Take 1 tablet (10 mg total) by mouth every morning.  Marland Kitchen aspirin 81 MG tablet Take 81 mg by mouth daily.  . Bevacizumab (AVASTIN IV) Place 1 drop into the right eye as directed. Every 6-8 weeks  . clobetasol cream (TEMOVATE) 0.05 % Apply 1 application topically daily as needed (for contact dermatitis). Contact dermatitis  . clopidogrel (PLAVIX) 75 MG tablet Take 1 tablet (75 mg total) by mouth daily.  . famotidine (PEPCID) 20 MG tablet Take 1 tablet (20 mg total) by mouth 2 (two) times daily.  Marland Kitchen FLUoxetine (PROZAC) 10 MG capsule Take 1 capsule (10 mg  total) by mouth daily.  Marland Kitchen glucose blood (ONE TOUCH ULTRA TEST) test strip Use as directed once daily to check blood sugar.  Diagnosis code E11.42  . levothyroxine (SYNTHROID) 125 MCG tablet TAKE 1 TABLET EVERY DAY BEFORE BREAKFAST  . metFORMIN (GLUCOPHAGE) 1000 MG tablet Take 1 tablet (1,000 mg total) by mouth daily.  . metoprolol succinate (TOPROL-XL) 25 MG 24 hr tablet TAKE 1 TABLET EVERY MORNING  . Multiple Vitamins-Minerals (ICAPS AREDS 2 PO) Take 2 capsules by mouth daily.  Marland Kitchen tiZANidine (ZANAFLEX) 4 MG tablet Take 1 tablet (4 mg total) by mouth every 6 (six) hours as needed for muscle spasms.  . vitamin B-12 (CYANOCOBALAMIN) 1000 MCG tablet Take 1 tablet (1,000 mcg total) by mouth daily.   No current facility-administered medications for this visit. (Other)      REVIEW OF SYSTEMS:    ALLERGIES No Known Allergies  PAST MEDICAL HISTORY Past Medical History:  Diagnosis Date  . Acute blood loss anemia 05/15/2011   post surgical  . Altered mental status 05/15/2011  . Anxiety   . B12 deficiency 09/23/2016  . Blood transfusion without reported diagnosis   . Cancer (HCC)    basal and squamous skin carcinoma   . Cataract   . Chronic kidney disease  hx of uti due to catheter hx of prostatitis, BPH -   . Depression   . Diabetes mellitus   . GERD (gastroesophageal reflux disease)   . Glaucoma 01/04/2013  . Heart murmur   . History of nuclear stress test    Myoview 3/17: EF 62%, no ischemia or scar, low risk  . Hx of transient ischemic attack (TIA) 01/04/2013  . Hx: UTI (urinary tract infection)   . Hyperlipidemia   . Hypertension   . Hypokalemia 05/15/2011  . Hypothyroidism   . Insomnia   . Macular degeneration   . Neuropathy   . OA (osteoarthritis)   . Pneumonia   . PVC's (premature ventricular contractions)   . Renal mass   . Stroke ([place])    mid 1990s    Past Surgical History:  Procedure Laterality Date  . APPENDECTOMY    . CATARACT EXTRACTION W/ INTRAOCULAR  LENS  IMPLANT, BILATERAL    . INGUINAL HERNIA REPAIR     X3  . LUMBAR LAMINECTOMY/DECOMPRESSION MICRODISCECTOMY N/A 02/12/2017   Procedure: Lumbar Three-Four, Lumbar Four-Five, Lumbar Five-Sacral One Laminectomy and Foraminotomy;  Surgeon: Earnie Larsson, MD;  Location: [facility];  Service: Neurosurgery;  Laterality: N/A;  . OTHER SURGICAL HISTORY     microwave procedure for prostate - 09/12   . PROSTATE ABLATION     microwave  . REPLACEMENT TOTAL KNEE    . THYROIDECTOMY    . TONSILLECTOMY AND ADENOIDECTOMY    . TOTAL KNEE ARTHROPLASTY  05/11/2011   Procedure: TOTAL KNEE ARTHROPLASTY;  Surgeon: Gearlean Alf;  Location: [facility];  Service: Orthopedics;  Laterality: Right;  . US ECHOCARDIOGRAPHY  11/10/2006   EF 55-60%  . US ECHOCARDIOGRAPHY  09/20/2002   EF 65-70%    FAMILY HISTORY Family History  Problem Relation Age of Onset  . Hypertension Mother   . Arthritis Mother   . Diabetes Mother   . Hypertension Father   . Stroke Father   . Kidney disease Father   . Hypertension Brother   . Diabetes Brother     SOCIAL HISTORY Social History   Tobacco Use  . Smoking status: Never Smoker  . Smokeless tobacco: Never Used  Vaping Use  . Vaping Use: Never used  Substance Use Topics  . Alcohol use: Yes    Alcohol/week: 7.0 standard drinks    Types: 7 Shots of liquor per week    Comment: 2 drinks most days  . Drug use: No         OPHTHALMIC EXAM:  Base Eye Exam    Visual Acuity (ETDRS)      Right Left   Dist cc 20/50 +2 CF at 1'   Dist ph cc NI NI   Correction: Glasses       Tonometry (Tonopen, 10:36 AM)      Right Left   Pressure 15 14       Pupils      Pupils Dark Light Shape React APD   Right PERRL 3 2 Round Brisk None   Left PERRL 3 2 Round Brisk None       Visual Fields (Counting fingers)      Left Right    Full Full       Extraocular Movement      Right Left    Full Full       Neuro/Psych    Oriented x3: Yes   Mood/Affect: Normal       Dilation     Right eye:  1.0% Mydriacyl, 2.5% Phenylephrine @ 10:36 AM        Slit Lamp and Fundus Exam    External Exam      Right Left   External Normal Normal       Slit Lamp Exam      Right Left   Lids/Lashes Normal Normal   Conjunctiva/Sclera White and quiet White and quiet   Cornea Clear Clear   Anterior Chamber Deep and quiet Deep and quiet   Iris Round and reactive Round and reactive   Lens Posterior chamber intraocular lens Posterior chamber intraocular lens   Anterior Vitreous Normal Normal       Fundus Exam      Right Left   Posterior Vitreous Normal    Disc Normal    C/D Ratio 0.3    Macula Advanced age related macular degeneration, Geographic atrophy, Hard drusen, no exudates, no clinical Subretinal neovascular membrane, no hemorrhage, Retinal pigment epithelial mottling    Vessels Normal    Periphery Normal           IMAGING AND PROCEDURES  Imaging and Procedures for 06/18/20  OCT, Retina - OU - Both Eyes       Right Eye Quality was good. Scan locations included subfoveal. Central Foveal Thickness: 257. Progression has been stable. Findings include abnormal foveal contour, central retinal atrophy, outer retinal atrophy, retinal drusen , subretinal hyper-reflective material, outer retinal tubulation.   Left Eye Quality was good. Scan locations included subfoveal. Central Foveal Thickness: 248. Progression has been stable. Findings include abnormal foveal contour, central retinal atrophy, outer retinal atrophy, retinal drusen , outer retinal tubulation, subretinal hyper-reflective material.   Notes Much improved and stable vision OD, geographic atrophy encroaching near the fovea  No signs of CNVM today with history of multiple recurrences repeat injection intravitreal Avastin OD today at 7-week interval  OS with outer retinal atrophy geographic atrophy from combination of ARMD as well as prior massive vitreomacular traction syndrome now released        Intravitreal Injection, Pharmacologic Agent - OD - Right Eye       Time Out 06/18/2020. 11:01 AM. Confirmed correct patient, procedure, site, and patient consented.   Anesthesia Topical anesthesia was used. Anesthetic medications included Akten 3.5%.   Procedure Preparation included Ofloxacin , 10% betadine to eyelids, 5% betadine to ocular surface. A 30 gauge needle was used.   Injection:  2.5 mg Bevacizumab (AVASTIN) 2.5mg /0.13mL SOSY   NDC: [id number], [id number]   Route: Intravitreal, Site: Right Eye  Post-op Post injection exam found visual acuity of at least counting fingers. The patient tolerated the procedure well. There were no complications. The patient received written and verbal post procedure care education. Post injection medications were not given.                 ASSESSMENT/PLAN:  Exudative age-related macular degeneration of right eye with active choroidal neovascularization (HCC) History of multiple recurrences right eye, CNVM, stable only at 7-week interval.  Repeat injection OD today and examination again in 7 weeks  Advanced nonexudative age-related macular degeneration of left eye with subfoveal involvement Graphic after beginning to encroach upon fovea subfoveal involvement of geographic atrophy subfoveal involvement of CNVM geographic atrophy is stable      ICD-10-CM   1. Exudative age-related macular degeneration of right eye with active choroidal neovascularization (HCC)  H35.3211 OCT, Retina - OU - Both Eyes    Intravitreal Injection, Pharmacologic Agent - OD - Right Eye  bevacizumab (AVASTIN) SOSY 2.5 mg  2. Advanced nonexudative age-related macular degeneration of left eye with subfoveal involvement  H35.3124     1.  History of multiple recurrences of CNVM OD, stable only on follow-up examinations every 7 weeks.  Repeat today [place] 7 weeks  2.  Dilate OU next  3.  Ophthalmic Meds Ordered this visit:  Meds ordered this encounter   Medications  . bevacizumab (AVASTIN) SOSY 2.5 mg       Return in about 7 weeks (around 08/06/2020) for DILATE OU, COLOR FP, AVASTIN OCT, OD.  There are no Patient Instructions on file for this visit.   Explained the diagnoses, plan, and follow up with the patient and they expressed understanding.  Patient expressed understanding of the importance of proper follow up care.   Clent Demark Layla Gramm M.D. Diseases & Surgery of the Retina and Vitreous [facility] 06/18/20     Abbreviations: M myopia (nearsighted); A astigmatism; H hyperopia (farsighted); P presbyopia; Mrx spectacle prescription;  CTL contact lenses; OD right eye; OS left eye; OU both eyes  XT exotropia; ET esotropia; PEK punctate epithelial keratitis; PEE punctate epithelial erosions; DES dry eye syndrome; MGD meibomian gland dysfunction; ATs artificial tears; PFAT's preservative free artificial tears; [place] nuclear sclerotic cataract; PSC posterior subcapsular cataract; ERM epi-retinal membrane; PVD posterior vitreous detachment; RD retinal detachment; DM diabetes mellitus; DR diabetic retinopathy; NPDR non-proliferative diabetic retinopathy; PDR proliferative diabetic retinopathy; CSME clinically significant macular edema; DME diabetic macular edema; dbh dot blot hemorrhages; CWS cotton wool spot; POAG primary open angle glaucoma; C/D cup-to-disc ratio; HVF humphrey visual field; GVF goldmann visual field; OCT optical coherence tomography; IOP intraocular pressure; BRVO Branch retinal vein occlusion; CRVO central retinal vein occlusion; CRAO central retinal artery occlusion; BRAO branch retinal artery occlusion; RT retinal tear; SB scleral buckle; PPV pars plana vitrectomy; VH Vitreous hemorrhage; PRP panretinal laser photocoagulation; IVK intravitreal kenalog; VMT vitreomacular traction; MH Macular hole;  NVD neovascularization of the disc; NVE neovascularization elsewhere; AREDS age related eye disease study; ARMD age  related macular degeneration; POAG primary open angle glaucoma; EBMD epithelial/anterior basement membrane dystrophy; ACIOL anterior chamber intraocular lens; IOL intraocular lens; PCIOL posterior chamber intraocular lens; Phaco/IOL phacoemulsification with intraocular lens placement; [place] photorefractive keratectomy; LASIK laser assisted in situ keratomileusis; HTN hypertension; DM diabetes mellitus; COPD chronic obstructive pulmonary disease

## 2020-07-15 ENCOUNTER — Other Ambulatory Visit: Payer: Self-pay

## 2020-07-15 ENCOUNTER — Ambulatory Visit (INDEPENDENT_AMBULATORY_CARE_PROVIDER_SITE_OTHER): Payer: Medicare Other | Admitting: Podiatry

## 2020-07-15 DIAGNOSIS — B351 Tinea unguium: Secondary | ICD-10-CM

## 2020-07-15 DIAGNOSIS — L84 Corns and callosities: Secondary | ICD-10-CM

## 2020-07-15 DIAGNOSIS — Z7901 Long term (current) use of anticoagulants: Secondary | ICD-10-CM

## 2020-07-15 DIAGNOSIS — E1140 Type 2 diabetes mellitus with diabetic neuropathy, unspecified: Secondary | ICD-10-CM | POA: Diagnosis not present

## 2020-07-15 DIAGNOSIS — M79676 Pain in unspecified toe(s): Secondary | ICD-10-CM | POA: Diagnosis not present

## 2020-07-15 NOTE — Progress Notes (Signed)
Subjective: 87 y.o. returns the office today for painful, elongated, thickened toenails which he cannot trim himself as well as for callus on the right foot. Denies any acute changes since last appointment and no new complaints today. Denies any systemic complaints such as fevers, chills, nausea, vomiting.   On Plavix.   PCP: Biagio Borg, MD Last seen 04/10/2020  Objective: AAO 3, NAD DP/PT pulses palpable, CRT less than 3 seconds Nails hypertrophic, dystrophic, elongated, brittle, discolored 10. There is tenderness overlying the nails 1-5 bilaterally. There is no surrounding erythema or drainage along the nail sites.  Hyperkeratotic pre-ulcerative callus on the right foot submetatarsal.  Upon debridement no ongoing ulceration, drainage or any signs of infection Bunion present  No pain with calf compression, swelling, warmth, erythema.  Assessment: Patient presents with symptomatic onychomycosis, hyperkeratotic lesion on plavix  Plan: -Treatment options including alternatives, risks, complications were discussed -Nails sharply debrided 10 without complication/bleeding. -Hyperkeratotic lesion sharply debrided x1 without any complications or bleeding -Discussed daily foot inspection. If there are any changes, to call the office immediately.  -Follow-up in 3 months or sooner if any problems are to arise. In the meantime, encouraged to call the office with any questions, concerns, changes symptoms.  Celesta Gentile, DPM

## 2020-08-06 ENCOUNTER — Other Ambulatory Visit: Payer: Self-pay

## 2020-08-06 ENCOUNTER — Encounter (INDEPENDENT_AMBULATORY_CARE_PROVIDER_SITE_OTHER): Payer: Self-pay | Admitting: Ophthalmology

## 2020-08-06 ENCOUNTER — Ambulatory Visit (INDEPENDENT_AMBULATORY_CARE_PROVIDER_SITE_OTHER): Payer: Medicare Other | Admitting: Ophthalmology

## 2020-08-06 DIAGNOSIS — H353211 Exudative age-related macular degeneration, right eye, with active choroidal neovascularization: Secondary | ICD-10-CM

## 2020-08-06 DIAGNOSIS — H353124 Nonexudative age-related macular degeneration, left eye, advanced atrophic with subfoveal involvement: Secondary | ICD-10-CM | POA: Diagnosis not present

## 2020-08-06 DIAGNOSIS — H353113 Nonexudative age-related macular degeneration, right eye, advanced atrophic without subfoveal involvement: Secondary | ICD-10-CM | POA: Diagnosis not present

## 2020-08-06 MED ORDER — BEVACIZUMAB 2.5 MG/0.1ML IZ SOSY
2.5000 mg | PREFILLED_SYRINGE | INTRAVITREAL | Status: AC | PRN
  Administered 2020-08-06: 2.5 mg via INTRAVITREAL

## 2020-08-06 NOTE — Patient Instructions (Signed)
Patient instructed to notify the office promptly new onset visual acuity declines or distortions

## 2020-08-06 NOTE — Assessment & Plan Note (Signed)
Stable over time. 

## 2020-08-06 NOTE — Assessment & Plan Note (Signed)
Stable atrophy, nearing the FAZThe nature of dry age related macular degeneration was discussed with the patient as well as its possible conversion to wet. The results of the AREDS 2 study was discussed with the patient. A diet rich in dark leafy green vegetables was advised and specific recommendations were made regarding supplements with AREDS 2 formulation . Control of hypertension and serum cholesterol may slow the disease. Smoking cessation is mandatory to slow the disease and diminish the risk of progressing to wet age related macular degeneration. The patient was instructed in the use of an [place] and was told to return immediately for any changes in the Grid. Stressed to the patient do not rub eyes

## 2020-08-06 NOTE — Assessment & Plan Note (Signed)
History of multiple recurrences in the right eye, stable now at 7-week interval.  May increase the interval of therapy to 8 weeks so as to minimize the risk of dry ARMD progression

## 2020-08-06 NOTE — Progress Notes (Signed)
08/06/2020     CHIEF COMPLAINT Patient presents for Retina Follow Up (7 WK FU OD, POSS AVASTIN OD///Pt reports stable vision OD. Pt denies any new F/F, pain, or pressure OU. ///Last BS: unsure, hasn't checked today. )   HISTORY OF PRESENT ILLNESS: Stephen Wilkinson is a 87 y.o. male who presents to the clinic today for:   HPI    Retina Follow Up    Patient presents with  Wet AMD.  In right eye.  This started 7 weeks ago.  Duration of 7 weeks.  Since onset it is stable. Additional comments: 7 WK FU OD, POSS AVASTIN OD   Pt reports stable vision OD. Pt denies any new F/F, pain, or pressure OU.    Last BS: unsure, hasn't checked today.        Last edited by Donne Hazel on 08/06/2020 10:46 AM. (History)      Referring physician: Biagio Borg, MD [place],  [place] [id number]  HISTORICAL INFORMATION:   Selected notes from the MEDICAL RECORD NUMBER    Lab Results  Component Value Date   HGBA1C 6.5 10/03/2019     CURRENT MEDICATIONS: Current Outpatient Medications (Ophthalmic Drugs)  Medication Sig  . latanoprost (XALATAN) 0.005 % ophthalmic solution INSTILL 1 DROP INTO BOTH EYES AT BEDTIME   No current facility-administered medications for this visit. (Ophthalmic Drugs)   Current Outpatient Medications (Other)  Medication Sig  . amLODipine (NORVASC) 10 MG tablet Take 1 tablet (10 mg total) by mouth every morning.  Marland Kitchen aspirin 81 MG tablet Take 81 mg by mouth daily.  . Bevacizumab (AVASTIN IV) Place 1 drop into the right eye as directed. Every 6-8 weeks  . clobetasol cream (TEMOVATE) 9.51 % Apply 1 application topically daily as needed (for contact dermatitis). Contact dermatitis  . clopidogrel (PLAVIX) 75 MG tablet Take 1 tablet (75 mg total) by mouth daily.  . famotidine (PEPCID) 20 MG tablet Take 1 tablet (20 mg total) by mouth 2 (two) times daily.  Marland Kitchen FLUoxetine (PROZAC) 10 MG capsule Take 1 capsule (10 mg total) by mouth daily.  Marland Kitchen glucose blood (ONE  TOUCH ULTRA TEST) test strip Use as directed once daily to check blood sugar.  Diagnosis code E11.42  . levothyroxine (SYNTHROID) 125 MCG tablet TAKE 1 TABLET EVERY DAY BEFORE BREAKFAST  . metFORMIN (GLUCOPHAGE) 1000 MG tablet Take 1 tablet (1,000 mg total) by mouth daily.  . metoprolol succinate (TOPROL-XL) 25 MG 24 hr tablet TAKE 1 TABLET EVERY MORNING  . Multiple Vitamins-Minerals (ICAPS AREDS 2 PO) Take 2 capsules by mouth daily.  Marland Kitchen tiZANidine (ZANAFLEX) 4 MG tablet Take 1 tablet (4 mg total) by mouth every 6 (six) hours as needed for muscle spasms.  . vitamin B-12 (CYANOCOBALAMIN) 1000 MCG tablet Take 1 tablet (1,000 mcg total) by mouth daily.   No current facility-administered medications for this visit. (Other)      REVIEW OF SYSTEMS:    ALLERGIES No Known Allergies  PAST MEDICAL HISTORY Past Medical History:  Diagnosis Date  . Acute blood loss anemia 05/15/2011   post surgical  . Altered mental status 05/15/2011  . Anxiety   . B12 deficiency 09/23/2016  . Blood transfusion without reported diagnosis   . Cancer ([place])    basal and squamous skin carcinoma   . Cataract   . Chronic kidney disease    hx of uti due to catheter hx of prostatitis, BPH -   . Depression   .  Diabetes mellitus   . GERD (gastroesophageal reflux disease)   . Glaucoma 01/04/2013  . Heart murmur   . History of nuclear stress test    Myoview 3/17: EF 62%, no ischemia or scar, low risk  . Hx of transient ischemic attack (TIA) 01/04/2013  . Hx: UTI (urinary tract infection)   . Hyperlipidemia   . Hypertension   . Hypokalemia 05/15/2011  . Hypothyroidism   . Insomnia   . Macular degeneration   . Neuropathy   . OA (osteoarthritis)   . Pneumonia   . PVC's (premature ventricular contractions)   . Renal mass   . Stroke ([place])    mid 1990s    Past Surgical History:  Procedure Laterality Date  . APPENDECTOMY    . CATARACT EXTRACTION W/ INTRAOCULAR LENS  IMPLANT, BILATERAL    . INGUINAL HERNIA  REPAIR     X3  . LUMBAR LAMINECTOMY/DECOMPRESSION MICRODISCECTOMY N/A 02/12/2017   Procedure: Lumbar Three-Four, Lumbar Four-Five, Lumbar Five-Sacral One Laminectomy and Foraminotomy;  Surgeon: Earnie Larsson, MD;  Location: [facility];  Service: Neurosurgery;  Laterality: N/A;  . OTHER SURGICAL HISTORY     microwave procedure for prostate - 09/12   . PROSTATE ABLATION     microwave  . REPLACEMENT TOTAL KNEE    . THYROIDECTOMY    . TONSILLECTOMY AND ADENOIDECTOMY    . TOTAL KNEE ARTHROPLASTY  05/11/2011   Procedure: TOTAL KNEE ARTHROPLASTY;  Surgeon: Gearlean Alf;  Location: [facility];  Service: Orthopedics;  Laterality: Right;  . US ECHOCARDIOGRAPHY  11/10/2006   EF 55-60%  . US ECHOCARDIOGRAPHY  09/20/2002   EF 65-70%    FAMILY HISTORY Family History  Problem Relation Age of Onset  . Hypertension Mother   . Arthritis Mother   . Diabetes Mother   . Hypertension Father   . Stroke Father   . Kidney disease Father   . Hypertension Brother   . Diabetes Brother     SOCIAL HISTORY Social History   Tobacco Use  . Smoking status: Never Smoker  . Smokeless tobacco: Never Used  Vaping Use  . Vaping Use: Never used  Substance Use Topics  . Alcohol use: Yes    Alcohol/week: 7.0 standard drinks    Types: 7 Shots of liquor per week    Comment: 2 drinks most days  . Drug use: No         OPHTHALMIC EXAM:  Base Eye Exam    Visual Acuity (ETDRS)      Right Left   Dist [place] 20/60 +2 CF at 1'   Dist ph  NI NI       Tonometry (Tonopen, 10:51 AM)      Right Left   Pressure 15 14       Pupils      Pupils Dark Light Shape React APD   Right PERRL 3 2 Round Brisk None   Left PERRL 3 2 Round Brisk None       Visual Fields (Counting fingers)      Left Right    Full Full       Extraocular Movement      Right Left    Full Full       Neuro/Psych    Oriented x3: Yes   Mood/Affect: Normal       Dilation    Both eyes: 1.0% Mydriacyl, 2.5% Phenylephrine @ 10:51 AM         Slit Lamp and Fundus Exam  External Exam      Right Left   External Normal Normal       Slit Lamp Exam      Right Left   Lids/Lashes Normal Normal   Conjunctiva/Sclera White and quiet White and quiet   Cornea Clear Clear   Anterior Chamber Deep and quiet Deep and quiet   Iris Round and reactive Round and reactive   Lens Posterior chamber intraocular lens Posterior chamber intraocular lens   Anterior Vitreous Normal Normal       Fundus Exam      Right Left   Posterior Vitreous Normal    Disc Normal    C/D Ratio 0.3    Macula Advanced age related macular degeneration, Geographic atrophy, Hard drusen, no exudates, no clinical Subretinal neovascular membrane, no hemorrhage, Retinal pigment epithelial mottling    Vessels Normal    Periphery Normal           IMAGING AND PROCEDURES  Imaging and Procedures for 08/06/20  OCT, Retina - OU - Both Eyes       Right Eye Quality was good. Scan locations included subfoveal. Central Foveal Thickness: 231. Progression has been stable. Findings include abnormal foveal contour, central retinal atrophy, outer retinal atrophy, retinal drusen , subretinal hyper-reflective material, outer retinal tubulation.   Left Eye Quality was good. Scan locations included subfoveal. Central Foveal Thickness: 271. Progression has been stable. Findings include abnormal foveal contour, central retinal atrophy, outer retinal atrophy, retinal drusen , outer retinal tubulation, subretinal hyper-reflective material.   Notes Much improved and stable vision OD, geographic atrophy encroaching near the fovea  No signs of CNVM today with history of multiple recurrences repeat injection intravitreal Avastin OD today at 7-week interval  OS with outer retinal atrophy geographic atrophy from combination of ARMD as well as prior massive vitreomacular traction syndrome now released       Color Fundus Photography Optos - OU - Both Eyes       Right  Eye Progression has been stable. Disc findings include normal observations. Macula : geographic atrophy. Vessels : normal observations. Periphery : normal observations.   Left Eye Progression has been stable. Disc findings include normal observations. Macula : geographic atrophy.   Notes OD with geographic atrophy nearing the foveal avascular zone region.  OS with large geographic atrophy encompassing the macular region.  No remaining traction remains.  Good PRP inferotemporal for sector vein occlusion in the past       Intravitreal Injection, Pharmacologic Agent - OD - Right Eye       Time Out 08/06/2020. 11:40 AM. Confirmed correct patient, procedure, site, and patient consented.   Anesthesia Anesthetic medications included Proparacaine 0.5%, Akten 3.5%.   Procedure Preparation included 5% betadine to ocular surface, 10% betadine to eyelids, Tobramycin 0.3%. A 30 gauge needle was used.   Injection:  2.5 mg Bevacizumab (AVASTIN) 2.5mg /0.89mL SOSY   NDC: 32202-542-70, Lot: [id number]   Route: Intravitreal, Site: Right Eye  Post-op Post injection exam found visual acuity of at least counting fingers. The patient tolerated the procedure well. There were no complications. The patient received written and verbal post procedure care education. Post injection medications were not given.                 ASSESSMENT/PLAN:  Advanced nonexudative age-related macular degeneration of left eye with subfoveal involvement Stable over time  Advanced nonexudative age-related macular degeneration of right eye without subfoveal involvement Stable atrophy, nearing the FAZThe nature of dry age  related macular degeneration was discussed with the patient as well as its possible conversion to wet. The results of the AREDS 2 study was discussed with the patient. A diet rich in dark leafy green vegetables was advised and specific recommendations were made regarding supplements with AREDS 2  formulation . Control of hypertension and serum cholesterol may slow the disease. Smoking cessation is mandatory to slow the disease and diminish the risk of progressing to wet age related macular degeneration. The patient was instructed in the use of an [place] and was told to return immediately for any changes in the Grid. Stressed to the patient do not rub eyes  Exudative age-related macular degeneration of right eye with active choroidal neovascularization ([place]) History of multiple recurrences in the right eye, stable now at 7-week interval.  May increase the interval of therapy to 8 weeks so as to minimize the risk of dry ARMD progression      ICD-10-CM   1. Exudative age-related macular degeneration of right eye with active choroidal neovascularization (HCC)  H35.3211 OCT, Retina - OU - Both Eyes    Color Fundus Photography Optos - OU - Both Eyes    Intravitreal Injection, Pharmacologic Agent - OD - Right Eye    bevacizumab (AVASTIN) SOSY 2.5 mg  2. Advanced nonexudative age-related macular degeneration of left eye with subfoveal involvement  H35.3124   3. Advanced nonexudative age-related macular degeneration of right eye without subfoveal involvement  H35.3113     1.  OD with a lengthy history of wet ARMD and recurrences yet now also with progression of dry ARMD.  Has been stable in the past at 6-week exam intervals and therapy follow-up now for the last 1 year x 7-week intervals and now will increase the interval of examination to 8 weeks  2.  3.  Ophthalmic Meds Ordered this visit:  Meds ordered this encounter  Medications  . bevacizumab (AVASTIN) SOSY 2.5 mg       Return in about 8 weeks (around 10/01/2020) for dilate, OD, AVASTIN OCT.  Patient Instructions  Patient instructed to notify the office promptly new onset visual acuity declines or distortions    Explained the diagnoses, plan, and follow up with the patient and they expressed understanding.  Patient  expressed understanding of the importance of proper follow up care.   Clent Demark Ger Ringenberg M.D. Diseases & Surgery of the Retina and Vitreous [facility] [place] 08/06/20     Abbreviations: M myopia (nearsighted); A astigmatism; H hyperopia (farsighted); P presbyopia; Mrx spectacle prescription;  CTL contact lenses; OD right eye; OS left eye; OU both eyes  XT exotropia; ET esotropia; PEK punctate epithelial keratitis; PEE punctate epithelial erosions; DES dry eye syndrome; MGD meibomian gland dysfunction; ATs artificial tears; PFAT's preservative free artificial tears; [place] nuclear sclerotic cataract; PSC posterior subcapsular cataract; ERM epi-retinal membrane; PVD posterior vitreous detachment; RD retinal detachment; DM diabetes mellitus; DR diabetic retinopathy; NPDR non-proliferative diabetic retinopathy; PDR proliferative diabetic retinopathy; CSME clinically significant macular edema; DME diabetic macular edema; dbh dot blot hemorrhages; CWS cotton wool spot; POAG primary open angle glaucoma; C/D cup-to-disc ratio; HVF humphrey visual field; GVF goldmann visual field; OCT optical coherence tomography; IOP intraocular pressure; BRVO Branch retinal vein occlusion; CRVO central retinal vein occlusion; CRAO central retinal artery occlusion; BRAO branch retinal artery occlusion; RT retinal tear; SB scleral buckle; PPV pars plana vitrectomy; VH Vitreous hemorrhage; PRP panretinal laser photocoagulation; IVK intravitreal kenalog; VMT vitreomacular traction; MH Macular hole;  NVD neovascularization  of the disc; NVE neovascularization elsewhere; AREDS age related eye disease study; ARMD age related macular degeneration; POAG primary open angle glaucoma; EBMD epithelial/anterior basement membrane dystrophy; ACIOL anterior chamber intraocular lens; IOL intraocular lens; PCIOL posterior chamber intraocular lens; Phaco/IOL phacoemulsification with intraocular lens placement; [place] photorefractive keratectomy;  LASIK laser assisted in situ keratomileusis; HTN hypertension; DM diabetes mellitus; COPD chronic obstructive pulmonary disease

## 2020-08-13 ENCOUNTER — Other Ambulatory Visit: Payer: Self-pay | Admitting: Internal Medicine

## 2020-08-13 ENCOUNTER — Other Ambulatory Visit: Payer: Self-pay | Admitting: Cardiology

## 2020-09-10 ENCOUNTER — Other Ambulatory Visit: Payer: Self-pay

## 2020-09-10 ENCOUNTER — Ambulatory Visit (INDEPENDENT_AMBULATORY_CARE_PROVIDER_SITE_OTHER): Payer: Medicare Other

## 2020-09-10 VITALS — BP 126/80 | HR 67 | Temp 98.3°F | Ht 73.0 in | Wt 179.2 lb

## 2020-09-10 DIAGNOSIS — Z0000 Encounter for general adult medical examination without abnormal findings: Secondary | ICD-10-CM | POA: Diagnosis not present

## 2020-09-10 NOTE — Progress Notes (Signed)
Subjective:   Stephen Wilkinson is a 87 y.o. male who presents for Medicare Annual/Subsequent preventive examination.  Review of Systems    No ROS. Medicare Wellness Visit. Additional risk factors are reflected in social history. Cardiac Risk Factors include: male gender;hypertension;family history of premature cardiovascular disease;dyslipidemia;diabetes mellitus;advanced age (>63men, >70 women)     Objective:    Today's Vitals   09/10/20 1101  BP: 126/80  Pulse: 67  Temp: 98.3 F (36.8 C)  SpO2: 98%  Weight: 179 lb 3.2 oz (81.3 kg)  Height: 6\' 1"  (1.854 m)  PainSc: 0-No pain   Body mass index is 23.64 kg/m.  Advanced Directives 09/10/2020 05/02/2020 02/12/2017 02/12/2017 02/05/2017 08/19/2015 06/08/2014  Does Patient Have a Medical Advance Directive? Yes Yes Yes Yes Yes Yes Yes  Type of Advance Directive Living will;Healthcare Power of [place];Living will [place];Living will [place];Living will [place];Living will - Living will;Healthcare Power of [place];Out of facility DNR (pink MOST or yellow form)  Does patient want to make changes to medical advance directive? No - Patient declined No - Patient declined No - Patient declined - - - -  Copy of [place] in Chart? No - copy requested Yes - validated most recent copy scanned in chart (See row information) No - copy requested No - copy requested (No Data) Yes -  Would patient like information on creating a medical advance directive? - No - Patient declined - - - - -  Pre-existing out of facility DNR order (yellow form or pink MOST form) - - - - - - -    Current Medications (verified) Outpatient Encounter Medications as of 09/10/2020  Medication Sig  . amLODipine (NORVASC) 10 MG tablet TAKE 1 TABLET EVERY MORNING  . aspirin 81 MG tablet Take 81 mg by mouth daily.  . Bevacizumab (AVASTIN IV) Place 1 drop into the right eye as  directed. Every 6-8 weeks  . clobetasol cream (TEMOVATE) 7.56 % Apply 1 application topically daily as needed (for contact dermatitis). Contact dermatitis  . clopidogrel (PLAVIX) 75 MG tablet Take 1 tablet (75 mg total) by mouth daily.  . famotidine (PEPCID) 20 MG tablet Take 1 tablet (20 mg total) by mouth 2 (two) times daily.  Marland Kitchen glucose blood (ONE TOUCH ULTRA TEST) test strip Use as directed once daily to check blood sugar.  Diagnosis code E11.42  . latanoprost (XALATAN) 0.005 % ophthalmic solution INSTILL 1 DROP INTO BOTH EYES AT BEDTIME  . levothyroxine (SYNTHROID) 125 MCG tablet TAKE 1 TABLET EVERY DAY BEFORE BREAKFAST  . metFORMIN (GLUCOPHAGE) 1000 MG tablet TAKE 1 TABLET EVERY DAY  . metoprolol succinate (TOPROL-XL) 25 MG 24 hr tablet TAKE 1 TABLET EVERY MORNING  . Multiple Vitamins-Minerals (ICAPS AREDS 2 PO) Take 2 capsules by mouth daily.  Marland Kitchen tiZANidine (ZANAFLEX) 4 MG tablet Take 1 tablet (4 mg total) by mouth every 6 (six) hours as needed for muscle spasms.  . [DISCONTINUED] FLUoxetine (PROZAC) 10 MG capsule TAKE 1 CAPSULE EVERY DAY  . [DISCONTINUED] vitamin B-12 (CYANOCOBALAMIN) 1000 MCG tablet Take 1 tablet (1,000 mcg total) by mouth daily.   No facility-administered encounter medications on file as of 09/10/2020.    Allergies (verified) Patient has no known allergies.   History: Past Medical History:  Diagnosis Date  . Acute blood loss anemia 05/15/2011   post surgical  . Altered mental status 05/15/2011  . Anxiety   . B12 deficiency 09/23/2016  .  Blood transfusion without reported diagnosis   . Cancer ([place])    basal and squamous skin carcinoma   . Cataract   . Chronic kidney disease    hx of uti due to catheter hx of prostatitis, BPH -   . Depression   . Diabetes mellitus   . GERD (gastroesophageal reflux disease)   . Glaucoma 01/04/2013  . Heart murmur   . History of nuclear stress test    Myoview 3/17: EF 62%, no ischemia or scar, low risk  . Hx of transient  ischemic attack (TIA) 01/04/2013  . Hx: UTI (urinary tract infection)   . Hyperlipidemia   . Hypertension   . Hypokalemia 05/15/2011  . Hypothyroidism   . Insomnia   . Macular degeneration   . Neuropathy   . OA (osteoarthritis)   . Pneumonia   . PVC's (premature ventricular contractions)   . Renal mass   . Stroke ([place])    mid 1990s    Past Surgical History:  Procedure Laterality Date  . APPENDECTOMY    . CATARACT EXTRACTION W/ INTRAOCULAR LENS  IMPLANT, BILATERAL    . INGUINAL HERNIA REPAIR     X3  . LUMBAR LAMINECTOMY/DECOMPRESSION MICRODISCECTOMY N/A 02/12/2017   Procedure: Lumbar Three-Four, Lumbar Four-Five, Lumbar Five-Sacral One Laminectomy and Foraminotomy;  Surgeon: Earnie Larsson, MD;  Location: [facility];  Service: Neurosurgery;  Laterality: N/A;  . OTHER SURGICAL HISTORY     microwave procedure for prostate - 09/12   . PROSTATE ABLATION     microwave  . REPLACEMENT TOTAL KNEE    . THYROIDECTOMY    . TONSILLECTOMY AND ADENOIDECTOMY    . TOTAL KNEE ARTHROPLASTY  05/11/2011   Procedure: TOTAL KNEE ARTHROPLASTY;  Surgeon: Gearlean Alf;  Location: [facility];  Service: Orthopedics;  Laterality: Right;  . US ECHOCARDIOGRAPHY  11/10/2006   EF 55-60%  . US ECHOCARDIOGRAPHY  09/20/2002   EF 65-70%   Family History  Problem Relation Age of Onset  . Hypertension Mother   . Arthritis Mother   . Diabetes Mother   . Hypertension Father   . Stroke Father   . Kidney disease Father   . Hypertension Brother   . Diabetes Brother    Social History   Socioeconomic History  . Marital status: Married    Spouse name: Not on file  . Number of children: 3  . Years of education: Not on file  . Highest education level: Not on file  Occupational History  . Occupation: retired    Fish farm manager: RETIRED    Comment: [place] [employer]  Tobacco Use  . Smoking status: Never Smoker  . Smokeless tobacco: Never Used  Vaping Use  . Vaping Use: Never used  Substance and Sexual Activity   . Alcohol use: Yes    Alcohol/week: 7.0 standard drinks    Types: 7 Shots of liquor per week    Comment: 2 drinks most days  . Drug use: No  . Sexual activity: Never    Birth control/protection: None  Other Topics Concern  . 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  Difficulty of Paying Living Expenses: Not hard at all  Food Insecurity: No Food Insecurity  . Worried About Charity fundraiser in the Last Year: Never true  . Ran Out of Food in the Last Year: Never true  Transportation Needs: No Transportation Needs  . Lack of Transportation (Medical): No  . Lack of Transportation (Non-Medical): No  Physical Activity: Inactive  . Days of Exercise per Week: 0 days  . Minutes of Exercise per Session: 0 min  Stress: No Stress Concern Present  . Feeling of Stress : Not at all  Social Connections: Not on file    Tobacco Counseling Counseling given: Not Answered   Clinical Intake:  Pre-visit preparation completed: Yes  Pain : No/denies pain Pain Score: 0-No pain     BMI - recorded: 23.64 Nutritional Status: BMI of 19-24  Normal Nutritional Risks: None Diabetes: Yes CBG done?: No Did pt. bring in CBG monitor from home?: No  How often do you need to have someone help you when you read instructions, pamphlets, or other written materials from your doctor or pharmacy?: 1 - Never What is the last grade level you completed in school?: Graduated from [place]; Retired VP of [employer]  Diabetic? yes  Interpreter Needed?: No  Information entered by :: Lisette Abu, LPN   Activities of Daily Living In your present state of health, do you have any difficulty performing the following activities:  09/10/2020  Hearing? Y  Vision? Y  Difficulty concentrating or making decisions? N  Walking or climbing stairs? Y  Dressing or bathing? N  Doing errands, shopping? N  Preparing Food and eating ? N  Using the Toilet? N  In the past six months, have you accidently leaked urine? N  Do you have problems with loss of bowel control? N  Managing your Medications? N  Managing your Finances? N  Housekeeping or managing your Housekeeping? N  Some recent data might be hidden    Patient Care Team: Biagio Borg, MD as PCP - General (Internal Medicine) Stanford Breed Denice Bors, MD as PCP - Cardiology (Cardiology) Biagio Borg, MD as Consulting Physician (Internal Medicine)  Indicate any recent Medical Services you may have received from other than [facility] providers in the past year (date may be approximate).     Assessment:   This is a routine wellness examination for [facility].  Hearing/Vision screen No exam data present  Dietary issues and exercise activities discussed: Current Exercise Habits: The patient does not participate in regular exercise at present, Exercise limited by: cardiac condition(s);neurologic condition(s)  Goals    .  patient (pt-stated)      To stay active; stop worrying!       Depression Screen PHQ 2/9 Scores 09/10/2020 11/23/2019 10/03/2019 10/03/2019 09/23/2017 09/23/2016 03/24/2016  PHQ - 2 Score 0 4 1 2 1  0 0  PHQ- 9 Score - 12 - 6 - 8 -    Fall Risk Fall Risk  09/10/2020 11/23/2019 10/03/2019 10/03/2019 05/09/2019  Falls in the past year? 1 1 0 1 1  Comment - - - - Emmi Telephone Survey: data to providers prior to load  Number falls in past yr: 1 1 - 1 1  Comment - - - - Emmi Telephone Survey Actual Response = 5  Injury with Fall? 1 1 - 1 0  Risk for fall due to : Impaired balance/gait;Impaired vision;History of fall(s) Impaired balance/gait;History of fall(s);Impaired vision - Impaired vision;History of fall(s) -  Risk for fall due  to: Comment - - - - -  Follow up Falls  evaluation completed Falls evaluation completed - - -    FALL RISK PREVENTION PERTAINING TO THE HOME:  Any stairs in or around the home? Yes  If so, are there any without handrails? No  Home free of loose throw rugs in walkways, pet beds, electrical cords, etc? Yes  Adequate lighting in your home to reduce risk of falls? Yes   ASSISTIVE DEVICES UTILIZED TO PREVENT FALLS:  Life alert? No  Use of a cane, walker or w/c? Yes  Grab bars in the bathroom? No  Shower chair or bench in shower? No  Elevated toilet seat or a handicapped toilet? No   TIMED UP AND GO:  Was the test performed? No .  Length of time to ambulate 10 feet: 0 sec.   Gait steady and fast with assistive device  Cognitive Function: Normal cognitive status assessed by direct observation by this Nurse Health Advisor. No abnormalities found.   MMSE - Mini Mental State Exam 08/19/2015  Not completed: (No Data)        Immunizations Immunization History  Administered Date(s) Administered  . Fluad Quad(high Dose 65+) 04/04/2019  . Influenza, High Dose Seasonal PF 03/30/2013, 03/22/2017, 03/29/2018  . Influenza,inj,Quad PF,6+ Mos 03/24/2016  . Influenza-Unspecified 04/15/2014, 03/27/2015, 03/26/2020  . PFIZER(Purple Top)SARS-COV-2 Vaccination 06/26/2019, 07/17/2019  . Pneumococcal Conjugate-13 10/18/2013  . Pneumococcal Polysaccharide-23 06/15/2010  . Tdap 01/04/2013    TDAP status: Up to date  Flu Vaccine status: Up to date  Pneumococcal vaccine status: Up to date  Covid-19 vaccine status: Completed vaccines  Qualifies for Shingles Vaccine? Yes   Zostavax completed No   Shingrix Completed?: No.    Education has been provided regarding the importance of this vaccine. Patient has been advised to call insurance company to determine out of pocket expense if they have not yet received this vaccine. Advised may also receive vaccine at local pharmacy or Health Dept. Verbalized acceptance and  understanding.  Screening Tests Health Maintenance  Topic Date Due  . COVID-19 Vaccine (3 - Pfizer risk 4-dose series) 08/14/2019  . HEMOGLOBIN A1C  04/03/2020  . URINE MICROALBUMIN  10/02/2020  . FOOT EXAM  10/02/2020  . OPHTHALMOLOGY EXAM  08/06/2021  . TETANUS/TDAP  01/05/2023  . INFLUENZA VACCINE  Completed  . PNA vac Low Risk Adult  Completed  . HPV VACCINES  Aged Out    Health Maintenance  Health Maintenance Due  Topic Date Due  . COVID-19 Vaccine (3 - Pfizer risk 4-dose series) 08/14/2019  . HEMOGLOBIN A1C  04/03/2020  . URINE MICROALBUMIN  10/02/2020    Colorectal cancer screening: No longer required.   Lung Cancer Screening: (Low Dose CT Chest recommended if Age 41-80 years, 30 pack-year currently smoking OR have quit w/in 15years.) does not qualify.   Lung Cancer Screening Referral: no  Additional Screening:  Hepatitis C Screening: does not qualify; Completed no  Vision Screening: Recommended annual ophthalmology exams for early detection of glaucoma and other disorders of the eye. Is the patient up to date with their annual eye exam?  Yes  Who is the provider or what is the name of the office in which the patient attends annual eye exams? Deloria Lair, MD. If pt is not established with a provider, would they like to be referred to a provider to establish care? No .   Dental Screening: Recommended annual dental exams for proper oral hygiene  Community Resource Referral / Chronic Care  Management: CRR required this visit?  No   CCM required this visit?  No      Plan:     I have personally reviewed and noted the following in the patient's chart:   . Medical and social history . Use of alcohol, tobacco or illicit drugs  . Current medications and supplements . Functional ability and status . Nutritional status . Physical activity . Advanced directives . List of other physicians . Hospitalizations, surgeries, and ER visits in previous 12  months . Vitals . Screenings to include cognitive, depression, and falls . Referrals and appointments  In addition, I have reviewed and discussed with patient certain preventive protocols, quality metrics, and best practice recommendations. A written personalized care plan for preventive services as well as general preventive health recommendations were provided to patient.     Sheral Flow, LPN   [id number]   Nurse Notes:  Medications reviewed with patient; no opioid use noted.

## 2020-09-10 NOTE — Patient Instructions (Signed)
[name] , Thank you for taking time to come for your Medicare Wellness Visit. I appreciate your ongoing commitment to your health goals. Please review the following plan we discussed and let me know if I can assist you in the future.   Screening recommendations/referrals: Colonoscopy: not a candidate for colon cancer screening due to age Recommended yearly ophthalmology/optometry visit for glaucoma screening and checkup Recommended yearly dental visit for hygiene and checkup  Vaccinations: Influenza vaccine: 03/26/2020 Pneumococcal vaccine: 06/15/2010, 10/18/2013 (completed) Tdap vaccine: 01/04/2013; due every 10 years Shingles vaccine: no record; can check with local pharmacy   Covid-19: 06/26/2019, 07/17/2019, 11/12/02021  Advanced directives: Please bring a copy of your health care power of attorney and living will to the office at your convenience.  Conditions/risks identified: Yes; Reviewed health maintenance screenings with patient today and relevant education, vaccines, and/or referrals were provided. Please continue to do your personal lifestyle choices by: daily care of teeth and gums, regular physical activity (goal should be 5 days a week for 30 minutes), eat a healthy diet, avoid tobacco and drug use, limiting any alcohol intake, taking a low-dose aspirin (if not allergic or have been advised by your provider otherwise) and taking vitamins and minerals as recommended by your provider. Continue doing brain stimulating activities (puzzles, reading, adult coloring books, staying active) to keep memory sharp. Continue to eat heart healthy diet (full of fruits, vegetables, whole grains, lean protein, water--limit salt, fat, and sugar intake) and increase physical activity as tolerated.  Next appointment: Please schedule your next Medicare Wellness Visit with your Nurse Health Advisor in 1 year by calling [phone].  Preventive Care 35 Years and Older, Male Preventive care refers to  lifestyle choices and visits with your health care provider that can promote health and wellness. What does preventive care include?  A yearly physical exam. This is also called an annual well check.  Dental exams once or twice a year.  Routine eye exams. Ask your health care provider how often you should have your eyes checked.  Personal lifestyle choices, including:  Daily care of your teeth and gums.  Regular physical activity.  Eating a healthy diet.  Avoiding tobacco and drug use.  Limiting alcohol use.  Practicing safe sex.  Taking low doses of aspirin every day.  Taking vitamin and mineral supplements as recommended by your health care provider. What happens during an annual well check? The services and screenings done by your health care provider during your annual well check will depend on your age, overall health, lifestyle risk factors, and family history of disease. Counseling  Your health care provider may ask you questions about your:  Alcohol use.  Tobacco use.  Drug use.  Emotional well-being.  Home and relationship well-being.  Sexual activity.  Eating habits.  History of falls.  Memory and ability to understand (cognition).  Work and work Statistician. Screening  You may have the following tests or measurements:  Height, weight, and BMI.  Blood pressure.  Lipid and cholesterol levels. These may be checked every 5 years, or more frequently if you are over 51 years old.  Skin check.  Lung cancer screening. You may have this screening every year starting at age 8 if you have a 30-pack-year history of smoking and currently smoke or have quit within the past 15 years.  Fecal occult blood test (FOBT) of the stool. You may have this test every year starting at age 45.  Flexible sigmoidoscopy or colonoscopy. You may have a  sigmoidoscopy every 5 years or a colonoscopy every 10 years starting at age 4.  Prostate cancer screening.  Recommendations will vary depending on your family history and other risks.  Hepatitis C blood test.  Hepatitis B blood test.  Sexually transmitted disease (STD) testing.  Diabetes screening. This is done by checking your blood sugar (glucose) after you have not eaten for a while (fasting). You may have this done every 1-3 years.  Abdominal aortic aneurysm (AAA) screening. You may need this if you are a current or former smoker.  Osteoporosis. You may be screened starting at age 10 if you are at high risk. Talk with your health care provider about your test results, treatment options, and if necessary, the need for more tests. Vaccines  Your health care provider may recommend certain vaccines, such as:  Influenza vaccine. This is recommended every year.  Tetanus, diphtheria, and acellular pertussis (Tdap, Td) vaccine. You may need a Td booster every 10 years.  Zoster vaccine. You may need this after age 27.  Pneumococcal 13-valent conjugate (PCV13) vaccine. One dose is recommended after age 23.  Pneumococcal polysaccharide (PPSV23) vaccine. One dose is recommended after age 58. Talk to your health care provider about which screenings and vaccines you need and how often you need them. This information is not intended to replace advice given to you by your health care provider. Make sure you discuss any questions you have with your health care provider. Document Released: 06/28/2015 Document Revised: 02/19/2016 Document Reviewed: 04/02/2015 Elsevier Interactive Patient Education  2017 [place] Prevention in the Home Falls can cause injuries. They can happen to people of all ages. There are many things you can do to make your home safe and to help prevent falls. What can I do on the outside of my home?  Regularly fix the edges of walkways and driveways and fix any cracks.  Remove anything that might make you trip as you walk through a door, such as a raised step or  threshold.  Trim any bushes or trees on the path to your home.  Use bright outdoor lighting.  Clear any walking paths of anything that might make someone trip, such as rocks or tools.  Regularly check to see if handrails are loose or broken. Make sure that both sides of any steps have handrails.  Any raised decks and porches should have guardrails on the edges.  Have any leaves, snow, or ice cleared regularly.  Use sand or salt on walking paths during winter.  Clean up any spills in your garage right away. This includes oil or grease spills. What can I do in the bathroom?  Use night lights.  Install grab bars by the toilet and in the tub and shower. Do not use towel bars as grab bars.  Use non-skid mats or decals in the tub or shower.  If you need to sit down in the shower, use a plastic, non-slip stool.  Keep the floor dry. Clean up any water that spills on the floor as soon as it happens.  Remove soap buildup in the tub or shower regularly.  Attach bath mats securely with double-sided non-slip rug tape.  Do not have throw rugs and other things on the floor that can make you trip. What can I do in the bedroom?  Use night lights.  Make sure that you have a light by your bed that is easy to reach.  Do not use any sheets or blankets that are  too big for your bed. They should not hang down onto the floor.  Have a firm chair that has side arms. You can use this for support while you get dressed.  Do not have throw rugs and other things on the floor that can make you trip. What can I do in the kitchen?  Clean up any spills right away.  Avoid walking on wet floors.  Keep items that you use a lot in easy-to-reach places.  If you need to reach something above you, use a strong step stool that has a grab bar.  Keep electrical cords out of the way.  Do not use floor polish or wax that makes floors slippery. If you must use wax, use non-skid floor wax.  Do not have  throw rugs and other things on the floor that can make you trip. What can I do with my stairs?  Do not leave any items on the stairs.  Make sure that there are handrails on both sides of the stairs and use them. Fix handrails that are broken or loose. Make sure that handrails are as long as the stairways.  Check any carpeting to make sure that it is firmly attached to the stairs. Fix any carpet that is loose or worn.  Avoid having throw rugs at the top or bottom of the stairs. If you do have throw rugs, attach them to the floor with carpet tape.  Make sure that you have a light switch at the top of the stairs and the bottom of the stairs. If you do not have them, ask someone to add them for you. What else can I do to help prevent falls?  Wear shoes that:  Do not have high heels.  Have rubber bottoms.  Are comfortable and fit you well.  Are closed at the toe. Do not wear sandals.  If you use a stepladder:  Make sure that it is fully opened. Do not climb a closed stepladder.  Make sure that both sides of the stepladder are locked into place.  Ask someone to hold it for you, if possible.  Clearly mark and make sure that you can see:  Any grab bars or handrails.  First and last steps.  Where the edge of each step is.  Use tools that help you move around (mobility aids) if they are needed. These include:  Canes.  Walkers.  Scooters.  Crutches.  Turn on the lights when you go into a dark area. Replace any light bulbs as soon as they burn out.  Set up your furniture so you have a clear path. Avoid moving your furniture around.  If any of your floors are uneven, fix them.  If there are any pets around you, be aware of where they are.  Review your medicines with your doctor. Some medicines can make you feel dizzy. This can increase your chance of falling. Ask your doctor what other things that you can do to help prevent falls. This information is not intended to  replace advice given to you by your health care provider. Make sure you discuss any questions you have with your health care provider. Document Released: 03/28/2009 Document Revised: 11/07/2015 Document Reviewed: 07/06/2014 Elsevier Interactive Patient Education  2017 [employer].

## 2020-10-01 ENCOUNTER — Ambulatory Visit (INDEPENDENT_AMBULATORY_CARE_PROVIDER_SITE_OTHER): Payer: Medicare Other | Admitting: Ophthalmology

## 2020-10-01 ENCOUNTER — Encounter (INDEPENDENT_AMBULATORY_CARE_PROVIDER_SITE_OTHER): Payer: Self-pay | Admitting: Ophthalmology

## 2020-10-01 ENCOUNTER — Other Ambulatory Visit: Payer: Self-pay

## 2020-10-01 DIAGNOSIS — H353211 Exudative age-related macular degeneration, right eye, with active choroidal neovascularization: Secondary | ICD-10-CM | POA: Diagnosis not present

## 2020-10-01 MED ORDER — BEVACIZUMAB 2.5 MG/0.1ML IZ SOSY
2.5000 mg | PREFILLED_SYRINGE | INTRAVITREAL | Status: AC | PRN
  Administered 2020-10-01: 2.5 mg via INTRAVITREAL

## 2020-10-01 NOTE — Assessment & Plan Note (Signed)
The nature of wet macular degeneration was discussed with the patient.  Forms of therapy reviewed include the use of Anti-VEGF medications injected painlessly into the eye, as well as other possible treatment modalities, including thermal laser therapy. Fellow eye involvement and risks were discussed with the patient. Upon the finding of wet age related macular degeneration, treatment will be offered. The treatment regimen is on a treat as needed basis with the intent to treat if necessary and extend interval of exams when possible. On average 1 out of 6 patients do not need lifetime therapy. However, the risk of recurrent disease is high for a lifetime.  Initially monthly, then periodic, examinations and evaluations will determine whether the next treatment is required on the day of the examination.  History of multiple recurrences right eye with CNVM now the outer blood supply to the retina, maintaining 8-week follow-up interval will repeat injection Avastin today to maintain an examination again in 8 weeks

## 2020-10-01 NOTE — Progress Notes (Signed)
10/01/2020     CHIEF COMPLAINT Patient presents for Retina Follow Up (8 Wk F/U OD, poss Avastin OD//Pt sts he has been on high dose PCN for tooth extraction recently, and it blurs his [facility] OU. Pt denies any other new symptoms OU.)   HISTORY OF PRESENT ILLNESS: Stephen Wilkinson is a 87 y.o. male who presents to the clinic today for:   HPI    Retina Follow Up    Patient presents with  Wet AMD.  In right eye.  This started 8 weeks ago.  Severity is mild.  Duration of 8 weeks.  Since onset it is stable. Additional comments: 8 Wk F/U OD, poss Avastin OD  Pt sts he has been on high dose PCN for tooth extraction recently, and it blurs his [facility] OU. Pt denies any other new symptoms OU.       Last edited by Rockie Neighbours, Arthur on 10/01/2020 10:42 AM. (History)      Referring physician: Biagio Borg, MD [place],  [place] [id number]  HISTORICAL INFORMATION:   Selected notes from the MEDICAL RECORD NUMBER    Lab Results  Component Value Date   HGBA1C 6.5 10/03/2019     CURRENT MEDICATIONS: Current Outpatient Medications (Ophthalmic Drugs)  Medication Sig  . latanoprost (XALATAN) 0.005 % ophthalmic solution INSTILL 1 DROP INTO BOTH EYES AT BEDTIME   No current facility-administered medications for this visit. (Ophthalmic Drugs)   Current Outpatient Medications (Other)  Medication Sig  . amLODipine (NORVASC) 10 MG tablet TAKE 1 TABLET EVERY MORNING  . aspirin 81 MG tablet Take 81 mg by mouth daily.  . Bevacizumab (AVASTIN IV) Place 1 drop into the right eye as directed. Every 6-8 weeks  . clobetasol cream (TEMOVATE) 6.83 % Apply 1 application topically daily as needed (for contact dermatitis). Contact dermatitis  . clopidogrel (PLAVIX) 75 MG tablet Take 1 tablet (75 mg total) by mouth daily.  . famotidine (PEPCID) 20 MG tablet Take 1 tablet (20 mg total) by mouth 2 (two) times daily.  Marland Kitchen glucose blood (ONE TOUCH ULTRA TEST) test strip Use as directed once daily to check blood  sugar.  Diagnosis code E11.42  . levothyroxine (SYNTHROID) 125 MCG tablet TAKE 1 TABLET EVERY DAY BEFORE BREAKFAST  . metFORMIN (GLUCOPHAGE) 1000 MG tablet TAKE 1 TABLET EVERY DAY  . metoprolol succinate (TOPROL-XL) 25 MG 24 hr tablet TAKE 1 TABLET EVERY MORNING  . Multiple Vitamins-Minerals (ICAPS AREDS 2 PO) Take 2 capsules by mouth daily.  Marland Kitchen tiZANidine (ZANAFLEX) 4 MG tablet Take 1 tablet (4 mg total) by mouth every 6 (six) hours as needed for muscle spasms.   No current facility-administered medications for this visit. (Other)      REVIEW OF SYSTEMS:    ALLERGIES No Known Allergies  PAST MEDICAL HISTORY Past Medical History:  Diagnosis Date  . Acute blood loss anemia 05/15/2011   post surgical  . Altered mental status 05/15/2011  . Anxiety   . B12 deficiency 09/23/2016  . Blood transfusion without reported diagnosis   . Cancer ([place])    basal and squamous skin carcinoma   . Cataract   . Chronic kidney disease    hx of uti due to catheter hx of prostatitis, BPH -   . Depression   . Diabetes mellitus   . GERD (gastroesophageal reflux disease)   . Glaucoma 01/04/2013  . Heart murmur   . History of nuclear stress test    Myoview 3/17: EF  62%, no ischemia or scar, low risk  . Hx of transient ischemic attack (TIA) 01/04/2013  . Hx: UTI (urinary tract infection)   . Hyperlipidemia   . Hypertension   . Hypokalemia 05/15/2011  . Hypothyroidism   . Insomnia   . Macular degeneration   . Neuropathy   . OA (osteoarthritis)   . Pneumonia   . PVC's (premature ventricular contractions)   . Renal mass   . Stroke ([place])    mid 1990s    Past Surgical History:  Procedure Laterality Date  . APPENDECTOMY    . CATARACT EXTRACTION W/ INTRAOCULAR LENS  IMPLANT, BILATERAL    . INGUINAL HERNIA REPAIR     X3  . LUMBAR LAMINECTOMY/DECOMPRESSION MICRODISCECTOMY N/A 02/12/2017   Procedure: Lumbar Three-Four, Lumbar Four-Five, Lumbar Five-Sacral One Laminectomy and Foraminotomy;   Surgeon: Earnie Larsson, MD;  Location: [facility];  Service: Neurosurgery;  Laterality: N/A;  . OTHER SURGICAL HISTORY     microwave procedure for prostate - 09/12   . PROSTATE ABLATION     microwave  . REPLACEMENT TOTAL KNEE    . THYROIDECTOMY    . TONSILLECTOMY AND ADENOIDECTOMY    . TOTAL KNEE ARTHROPLASTY  05/11/2011   Procedure: TOTAL KNEE ARTHROPLASTY;  Surgeon: Gearlean Alf;  Location: [facility];  Service: Orthopedics;  Laterality: Right;  . US ECHOCARDIOGRAPHY  11/10/2006   EF 55-60%  . US ECHOCARDIOGRAPHY  09/20/2002   EF 65-70%    FAMILY HISTORY Family History  Problem Relation Age of Onset  . Hypertension Mother   . Arthritis Mother   . Diabetes Mother   . Hypertension Father   . Stroke Father   . Kidney disease Father   . Hypertension Brother   . Diabetes Brother     SOCIAL HISTORY Social History   Tobacco Use  . Smoking status: Never Smoker  . Smokeless tobacco: Never Used  Vaping Use  . Vaping Use: Never used  Substance Use Topics  . Alcohol use: Yes    Alcohol/week: 7.0 standard drinks    Types: 7 Shots of liquor per week    Comment: 2 drinks most days  . Drug use: No         OPHTHALMIC EXAM: Base Eye Exam    Visual Acuity (ETDRS)      Right Left   Dist cc 20/50 +2 CF at 3'   Dist ph cc NI NI   Correction: Glasses       Tonometry (Tonopen, 10:45 AM)      Right Left   Pressure 15 14       Pupils      Pupils Dark Light Shape React APD   Right PERRL 4 3 Round Brisk None   Left PERRL 4 3 Round Brisk None       Visual Fields (Counting fingers)      Left Right    Full Full       Extraocular Movement      Right Left    Full Full       Neuro/Psych    Oriented x3: Yes   Mood/Affect: Normal       Dilation    Right eye: 1.0% Mydriacyl, 2.5% Phenylephrine @ 10:45 AM        Slit Lamp and Fundus Exam    External Exam      Right Left   External Normal Normal       Slit Lamp Exam      Right Left  Lids/Lashes Normal Normal    Conjunctiva/Sclera White and quiet White and quiet   Cornea Clear Clear   Anterior Chamber Deep and quiet Deep and quiet   Iris Round and reactive Round and reactive   Lens Posterior chamber intraocular lens Posterior chamber intraocular lens   Anterior Vitreous Normal Normal       Fundus Exam      Right Left   Posterior Vitreous Normal    Disc Normal    C/D Ratio 0.35    Macula Advanced age related macular degeneration, Geographic atrophy, Hard drusen, no exudates, no clinical Subretinal neovascular membrane, no hemorrhage, Retinal pigment epithelial mottling, Retinal pigment epithelial atrophy    Vessels Normal    Periphery Normal           IMAGING AND PROCEDURES  Imaging and Procedures for 10/01/20  OCT, Retina - OU - Both Eyes       Right Eye Quality was good. Scan locations included subfoveal. Central Foveal Thickness: 248. Progression has been stable. Findings include abnormal foveal contour, central retinal atrophy, outer retinal atrophy, retinal drusen , subretinal hyper-reflective material, outer retinal tubulation.   Left Eye Quality was good. Scan locations included subfoveal. Central Foveal Thickness: 253. Progression has been stable. Findings include abnormal foveal contour, central retinal atrophy, outer retinal atrophy, retinal drusen , outer retinal tubulation, subretinal hyper-reflective material.   Notes Much improved and stable vision OD, geographic atrophy encroaching near the fovea  No signs of CNVM today with history of multiple recurrences repeat injection intravitreal Avastin OD today at 8-week interval  OS with outer retinal atrophy geographic atrophy from combination of ARMD as well as prior massive vitreomacular traction syndrome now released       Intravitreal Injection, Pharmacologic Agent - OD - Right Eye       Time Out 10/01/2020. 11:37 AM. Confirmed correct patient, procedure, site, and patient consented.   Anesthesia Anesthetic  medications included Proparacaine 0.5%, Akten 3.5%.   Procedure Preparation included 5% betadine to ocular surface, 10% betadine to eyelids, Tobramycin 0.3%. A 30 gauge needle was used.   Injection:  2.5 mg Bevacizumab (AVASTIN) 2.5mg /0.77mL SOSY   NDC: 67209-470-96, Lot: [id number]   Route: Intravitreal, Site: Right Eye  Post-op Post injection exam found visual acuity of at least counting fingers. The patient tolerated the procedure well. There were no complications. The patient received written and verbal post procedure care education. Post injection medications were not given.                 ASSESSMENT/PLAN:  Exudative age-related macular degeneration of right eye with active choroidal neovascularization (HCC) The nature of wet macular degeneration was discussed with the patient.  Forms of therapy reviewed include the use of Anti-VEGF medications injected painlessly into the eye, as well as other possible treatment modalities, including thermal laser therapy. Fellow eye involvement and risks were discussed with the patient. Upon the finding of wet age related macular degeneration, treatment will be offered. The treatment regimen is on a treat as needed basis with the intent to treat if necessary and extend interval of exams when possible. On average 1 out of 6 patients do not need lifetime therapy. However, the risk of recurrent disease is high for a lifetime.  Initially monthly, then periodic, examinations and evaluations will determine whether the next treatment is required on the day of the examination.  History of multiple recurrences right eye with CNVM now the outer blood supply to the retina, maintaining 8-week follow-up  interval will repeat injection Avastin today to maintain an examination again in 8 weeks      ICD-10-CM   1. Exudative age-related macular degeneration of right eye with active choroidal neovascularization (HCC)  H35.3211 OCT, Retina - OU - Both Eyes     Intravitreal Injection, Pharmacologic Agent - OD - Right Eye    bevacizumab (AVASTIN) SOSY 2.5 mg    1.  OD, stable over time care.  Repeat Avastin today to maintain current functioning and examination next in 8 weeks right eye  2.  3.  Ophthalmic Meds Ordered this visit:  Meds ordered this encounter  Medications  . bevacizumab (AVASTIN) SOSY 2.5 mg       Return in about 8 weeks (around 11/26/2020) for dilate, OD, AVASTIN OCT.  There are no Patient Instructions on file for this visit.   Explained the diagnoses, plan, and follow up with the patient and they expressed understanding.  Patient expressed understanding of the importance of proper follow up care.   Clent Demark Arabella Revelle M.D. Diseases & Surgery of the Retina and Vitreous [facility] [place] 10/01/20     Abbreviations: M myopia (nearsighted); A astigmatism; H hyperopia (farsighted); P presbyopia; Mrx spectacle prescription;  CTL contact lenses; OD right eye; OS left eye; OU both eyes  XT exotropia; ET esotropia; PEK punctate epithelial keratitis; PEE punctate epithelial erosions; DES dry eye syndrome; MGD meibomian gland dysfunction; ATs artificial tears; PFAT's preservative free artificial tears; [place] nuclear sclerotic cataract; PSC posterior subcapsular cataract; ERM epi-retinal membrane; PVD posterior vitreous detachment; RD retinal detachment; DM diabetes mellitus; DR diabetic retinopathy; NPDR non-proliferative diabetic retinopathy; PDR proliferative diabetic retinopathy; CSME clinically significant macular edema; DME diabetic macular edema; dbh dot blot hemorrhages; CWS cotton wool spot; POAG primary open angle glaucoma; C/D cup-to-disc ratio; HVF humphrey visual field; GVF goldmann visual field; OCT optical coherence tomography; IOP intraocular pressure; BRVO Branch retinal vein occlusion; CRVO central retinal vein occlusion; CRAO central retinal artery occlusion; BRAO branch retinal artery occlusion; RT retinal tear;  SB scleral buckle; PPV pars plana vitrectomy; VH Vitreous hemorrhage; PRP panretinal laser photocoagulation; IVK intravitreal kenalog; VMT vitreomacular traction; MH Macular hole;  NVD neovascularization of the disc; NVE neovascularization elsewhere; AREDS age related eye disease study; ARMD age related macular degeneration; POAG primary open angle glaucoma; EBMD epithelial/anterior basement membrane dystrophy; ACIOL anterior chamber intraocular lens; IOL intraocular lens; PCIOL posterior chamber intraocular lens; Phaco/IOL phacoemulsification with intraocular lens placement; [place] photorefractive keratectomy; LASIK laser assisted in situ keratomileusis; HTN hypertension; DM diabetes mellitus; COPD chronic obstructive pulmonary disease

## 2020-10-09 ENCOUNTER — Encounter: Payer: Self-pay | Admitting: Internal Medicine

## 2020-10-09 ENCOUNTER — Other Ambulatory Visit: Payer: Self-pay

## 2020-10-09 ENCOUNTER — Ambulatory Visit (INDEPENDENT_AMBULATORY_CARE_PROVIDER_SITE_OTHER): Payer: Medicare Other | Admitting: Internal Medicine

## 2020-10-09 VITALS — BP 114/78 | HR 56 | Temp 97.9°F | Ht 73.0 in | Wt 177.0 lb

## 2020-10-09 DIAGNOSIS — R296 Repeated falls: Secondary | ICD-10-CM | POA: Diagnosis not present

## 2020-10-09 DIAGNOSIS — E1142 Type 2 diabetes mellitus with diabetic polyneuropathy: Secondary | ICD-10-CM

## 2020-10-09 DIAGNOSIS — I700 Atherosclerosis of aorta: Secondary | ICD-10-CM

## 2020-10-09 DIAGNOSIS — F32A Depression, unspecified: Secondary | ICD-10-CM

## 2020-10-09 DIAGNOSIS — E538 Deficiency of other specified B group vitamins: Secondary | ICD-10-CM | POA: Diagnosis not present

## 2020-10-09 DIAGNOSIS — E7800 Pure hypercholesterolemia, unspecified: Secondary | ICD-10-CM | POA: Diagnosis not present

## 2020-10-09 DIAGNOSIS — E559 Vitamin D deficiency, unspecified: Secondary | ICD-10-CM

## 2020-10-09 DIAGNOSIS — I119 Hypertensive heart disease without heart failure: Secondary | ICD-10-CM | POA: Diagnosis not present

## 2020-10-09 DIAGNOSIS — F419 Anxiety disorder, unspecified: Secondary | ICD-10-CM | POA: Diagnosis not present

## 2020-10-09 DIAGNOSIS — E034 Atrophy of thyroid (acquired): Secondary | ICD-10-CM | POA: Diagnosis not present

## 2020-10-09 LAB — MICROALBUMIN / CREATININE URINE RATIO
Creatinine,U: 123.1 mg/dL
Microalb Creat Ratio: 0.6 mg/g (ref 0.0–30.0)
Microalb, Ur: 0.8 mg/dL (ref 0.0–1.9)

## 2020-10-09 LAB — CBC WITH DIFFERENTIAL/PLATELET
Basophils Absolute: 0.1 10*3/uL (ref 0.0–0.1)
Basophils Relative: 0.7 % (ref 0.0–3.0)
Eosinophils Absolute: 0.1 10*3/uL (ref 0.0–0.7)
Eosinophils Relative: 1.8 % (ref 0.0–5.0)
HCT: 41.5 % (ref 39.0–52.0)
Hemoglobin: 14.4 g/dL (ref 13.0–17.0)
Lymphocytes Relative: 17.4 % (ref 12.0–46.0)
Lymphs Abs: 1.3 10*3/uL (ref 0.7–4.0)
MCHC: 34.7 g/dL (ref 30.0–36.0)
MCV: 95.3 fl (ref 78.0–100.0)
Monocytes Absolute: 0.5 10*3/uL (ref 0.1–1.0)
Monocytes Relative: 7 % (ref 3.0–12.0)
Neutro Abs: 5.3 10*3/uL (ref 1.4–7.7)
Neutrophils Relative %: 73.1 % (ref 43.0–77.0)
Platelets: 250 10*3/uL (ref 150.0–400.0)
RBC: 4.36 Mil/uL (ref 4.22–5.81)
RDW: 13.1 % (ref 11.5–15.5)
WBC: 7.3 10*3/uL (ref 4.0–10.5)

## 2020-10-09 LAB — URINALYSIS, ROUTINE W REFLEX MICROSCOPIC
Bilirubin Urine: NEGATIVE
Hgb urine dipstick: NEGATIVE
Ketones, ur: NEGATIVE
Nitrite: NEGATIVE
Specific Gravity, Urine: 1.025 (ref 1.000–1.030)
Total Protein, Urine: NEGATIVE
Urine Glucose: NEGATIVE
Urobilinogen, UA: 0.2 (ref 0.0–1.0)
pH: 5 (ref 5.0–8.0)

## 2020-10-09 LAB — BASIC METABOLIC PANEL
BUN: 18 mg/dL (ref 6–23)
CO2: 25 mEq/L (ref 19–32)
Calcium: 9.6 mg/dL (ref 8.4–10.5)
Chloride: 103 mEq/L (ref 96–112)
Creatinine, Ser: 1.2 mg/dL (ref 0.40–1.50)
GFR: 53.35 mL/min — ABNORMAL LOW (ref >60.00)
Glucose, Bld: 110 mg/dL — ABNORMAL HIGH (ref 70–99)
Potassium: 4.5 mEq/L (ref 3.5–5.1)
Sodium: 140 mEq/L (ref 135–145)

## 2020-10-09 LAB — HEPATIC FUNCTION PANEL
ALT: 11 U/L (ref 0–53)
AST: 16 U/L (ref 0–37)
Albumin: 4.4 g/dL (ref 3.5–5.2)
Alkaline Phosphatase: 92 U/L (ref 39–117)
Bilirubin, Direct: 0.1 mg/dL (ref 0.0–0.3)
Total Bilirubin: 0.5 mg/dL (ref 0.2–1.2)
Total Protein: 7.5 g/dL (ref 6.0–8.3)

## 2020-10-09 LAB — LIPID PANEL
Cholesterol: 183 mg/dL (ref 0–200)
HDL: 69 mg/dL (ref >39.00)
LDL Cholesterol: 100 mg/dL — ABNORMAL HIGH (ref 0–99)
NonHDL: 114.37
Total CHOL/HDL Ratio: 3
Triglycerides: 72 mg/dL (ref 0.0–149.0)
VLDL: 14.4 mg/dL (ref 0.0–40.0)

## 2020-10-09 LAB — VITAMIN D 25 HYDROXY (VIT D DEFICIENCY, FRACTURES): VITD: 14.71 ng/mL — ABNORMAL LOW (ref 30.00–100.00)

## 2020-10-09 LAB — TSH: TSH: 1.75 u[IU]/mL (ref 0.35–4.50)

## 2020-10-09 LAB — VITAMIN B12: Vitamin B-12: 498 pg/mL (ref 211–911)

## 2020-10-09 LAB — HEMOGLOBIN A1C: Hgb A1c MFr Bld: 6.5 % (ref 4.6–6.5)

## 2020-10-09 MED ORDER — GLUCOSE BLOOD VI STRP: ORAL_STRIP | 11 refills | Status: DC

## 2020-10-09 NOTE — Progress Notes (Signed)
Patient ID: Stephen Wilkinson, male   DOB: 06/01/1929, 87 y.o.   MRN: [id number]        Chief Complaint: follow up HTN, HLD and hyperglycemia , aortic atheroscleroris, recurrent falls, hypothyroidism, anxiety depression       HPI:  Stephen Wilkinson is a 87 y.o. male here with above overall doing about the same, laments he is 30 and has some worsening fatigue and getting tired quickly it seems, had 2 recent falls in the home without injury, and declines need for PT at this time.  Pt denies chest pain, increased sob or doe, wheezing, orthopnea, PND, increased LE swelling, palpitations, dizziness or syncope.   Pt denies polydipsia, polyuria, or new focal neuro s/s.  Denies hyper or hypo thyroid symptoms such as voice, skin or hair change.  Denies worsening depressive symptoms, suicidal ideation, or panic; has ongoing anxiety, declines need for med change or referral today.       Pt denies fever, wt loss, night sweats, loss of appetite, or other constitutional symptoms  No other new complaint Wt Readings from Last 3 Encounters:  10/09/20 177 lb (80.3 kg)  09/10/20 179 lb 3.2 oz (81.3 kg)  05/28/20 178 lb 9.6 oz (81 kg)   BP Readings from Last 3 Encounters:  10/09/20 114/78  09/10/20 126/80  05/28/20 138/70         Past Medical History:  Diagnosis Date  . Acute blood loss anemia 05/15/2011   post surgical  . Altered mental status 05/15/2011  . Anxiety   . B12 deficiency 09/23/2016  . Blood transfusion without reported diagnosis   . Cancer ([place])    basal and squamous skin carcinoma   . Cataract   . Chronic kidney disease    hx of uti due to catheter hx of prostatitis, BPH -   . Depression   . Diabetes mellitus   . GERD (gastroesophageal reflux disease)   . Glaucoma 01/04/2013  . Heart murmur   . History of nuclear stress test    Myoview 3/17: EF 62%, no ischemia or scar, low risk  . Hx of transient ischemic attack (TIA) 01/04/2013  . Hx: UTI (urinary tract infection)   . Hyperlipidemia   . Hypertension    . Hypokalemia 05/15/2011  . Hypothyroidism   . Insomnia   . Macular degeneration   . Neuropathy   . OA (osteoarthritis)   . Pneumonia   . PVC's (premature ventricular contractions)   . Renal mass   . Stroke ([place])    mid 1990s    Past Surgical History:  Procedure Laterality Date  . APPENDECTOMY    . CATARACT EXTRACTION W/ INTRAOCULAR LENS  IMPLANT, BILATERAL    . INGUINAL HERNIA REPAIR     X3  . LUMBAR LAMINECTOMY/DECOMPRESSION MICRODISCECTOMY N/A 02/12/2017   Procedure: Lumbar Three-Four, Lumbar Four-Five, Lumbar Five-Sacral One Laminectomy and Foraminotomy;  Surgeon: Earnie Larsson, MD;  Location: [facility];  Service: Neurosurgery;  Laterality: N/A;  . OTHER SURGICAL HISTORY     microwave procedure for prostate - 09/12   . PROSTATE ABLATION     microwave  . REPLACEMENT TOTAL KNEE    . THYROIDECTOMY    . TONSILLECTOMY AND ADENOIDECTOMY    . TOTAL KNEE ARTHROPLASTY  05/11/2011   Procedure: TOTAL KNEE ARTHROPLASTY;  Surgeon: Gearlean Alf;  Location[facility];  Service: Orthopedics;  Laterality: Right;  . US ECHOCARDIOGRAPHY  11/10/2006   EF 55-60%  . US ECHOCARDIOGRAPHY  09/20/2002   EF 65-70%  reports that he has never smoked. He has never used smokeless tobacco. He reports current alcohol use of about 7.0 standard drinks of alcohol per week. He reports that he does not use drugs. family history includes Arthritis in his mother; Diabetes in his brother and mother; Hypertension in his brother, father, and mother; Kidney disease in his father; Stroke in his father. No Known Allergies Current Outpatient Medications on File Prior to Visit  Medication Sig Dispense Refill  . amLODipine (NORVASC) 10 MG tablet TAKE 1 TABLET EVERY MORNING 90 tablet 0  . aspirin 81 MG tablet Take 81 mg by mouth daily.    . Bevacizumab (AVASTIN IV) Place 1 drop into the right eye as directed. Every 6-8 weeks    . clobetasol cream (TEMOVATE) 3.97 % Apply 1 application topically daily as needed (for contact  dermatitis). Contact dermatitis  3  . clopidogrel (PLAVIX) 75 MG tablet Take 1 tablet (75 mg total) by mouth daily. 90 tablet 3  . famotidine (PEPCID) 20 MG tablet Take 1 tablet (20 mg total) by mouth 2 (two) times daily. 180 tablet 3  . latanoprost (XALATAN) 0.005 % ophthalmic solution INSTILL 1 DROP INTO BOTH EYES AT BEDTIME 2.5 mL 12  . levothyroxine (SYNTHROID) 125 MCG tablet TAKE 1 TABLET EVERY DAY BEFORE BREAKFAST 90 tablet 3  . metFORMIN (GLUCOPHAGE) 1000 MG tablet TAKE 1 TABLET EVERY DAY 90 tablet 1  . metoprolol succinate (TOPROL-XL) 25 MG 24 hr tablet TAKE 1 TABLET EVERY MORNING 90 tablet 2  . Multiple Vitamins-Minerals (ICAPS AREDS 2 PO) Take 2 capsules by mouth daily.    Marland Kitchen tiZANidine (ZANAFLEX) 4 MG tablet Take 1 tablet (4 mg total) by mouth every 6 (six) hours as needed for muscle spasms. 360 tablet 3   No current facility-administered medications on file prior to visit.        ROS:  All others reviewed and negative.  Objective        PE:  BP 114/78 (BP Location: Left Arm, Patient Position: Sitting, Cuff Size: Normal)   Pulse (!) 56   Temp 97.9 F (36.6 C)   Ht 6\' 1"  (1.854 m)   Wt 177 lb (80.3 kg)   SpO2 98%   BMI 23.35 kg/m                 Constitutional: Pt appears in NAD               HENT: Head: NCAT.                Right Ear: External ear normal.                 Left Ear: External ear normal.                Eyes: . Pupils are equal, round, and reactive to light. Conjunctivae and EOM are normal               Nose: without d/c or deformity               Neck: Neck supple. Gross normal ROM               Cardiovascular: Normal rate and regular rhythm.                 Pulmonary/Chest: Effort normal and breath sounds without rales or wheezing.                Abd:  Soft, NT, ND, +  BS, no organomegaly               Neurological: Pt is alert. At baseline orientation, motor grossly intact               Skin: Skin is warm. No rashes, no other new lesions, LE edema -  none               Psychiatric: Pt behavior is normal without agitation   Micro: none  Cardiac tracings I have personally interpreted today:  none  Pertinent Radiological findings (summarize): none   Lab Results  Component Value Date   WBC 7.3 10/09/2020   HGB 14.4 10/09/2020   HCT 41.5 10/09/2020   PLT 250.0 10/09/2020   GLUCOSE 110 (H) 10/09/2020   CHOL 183 10/09/2020   TRIG 72.0 10/09/2020   HDL 69.00 10/09/2020   LDLCALC 100 (H) 10/09/2020   ALT 11 10/09/2020   AST 16 10/09/2020   NA 140 10/09/2020   K 4.5 10/09/2020   CL 103 10/09/2020   CREATININE 1.20 10/09/2020   BUN 18 10/09/2020   CO2 25 10/09/2020   TSH 1.75 10/09/2020   INR 2.16 (H) 05/15/2011   HGBA1C 6.5 10/09/2020   MICROALBUR 0.8 10/09/2020   Assessment/Plan:  Stephen Wilkinson is a 77 y.o. White or Caucasian [1] male with  has a past medical history of Acute blood loss anemia (05/15/2011), Altered mental status (05/15/2011), Anxiety, B12 deficiency (09/23/2016), Blood transfusion without reported diagnosis, Cancer ([place]), Cataract, Chronic kidney disease, Depression, Diabetes mellitus, GERD (gastroesophageal reflux disease), Glaucoma (01/04/2013), Heart murmur, History of nuclear stress test, transient ischemic attack (TIA) (01/04/2013), UTI (urinary tract infection), Hyperlipidemia, Hypertension, Hypokalemia (05/15/2011), Hypothyroidism, Insomnia, Macular degeneration, Neuropathy, OA (osteoarthritis), Pneumonia, PVC's (premature ventricular contractions), Renal mass, and Stroke ([place]).  Aortic atherosclerosis (HCC) To cont low chol diet, exercise, declines statin   Recurrent falls With 2 recent without injury, declines PT referral  Hypothyroidism Lab Results  Component Value Date   TSH 1.75 10/09/2020   Stable, pt to continue levothyroxine  Current Outpatient Medications (Endocrine & Metabolic):  .  levothyroxine (SYNTHROID) 125 MCG tablet, TAKE 1 TABLET EVERY DAY BEFORE BREAKFAST .  metFORMIN (GLUCOPHAGE)  1000 MG tablet, TAKE 1 TABLET EVERY DAY  Current Outpatient Medications (Cardiovascular):  .  amLODipine (NORVASC) 10 MG tablet, TAKE 1 TABLET EVERY MORNING .  metoprolol succinate (TOPROL-XL) 25 MG 24 hr tablet, TAKE 1 TABLET EVERY MORNING   Current Outpatient Medications (Analgesics):  .  aspirin 81 MG tablet, Take 81 mg by mouth daily.  Current Outpatient Medications (Hematological):  .  clopidogrel (PLAVIX) 75 MG tablet, Take 1 tablet (75 mg total) by mouth daily.  Current Outpatient Medications (Other):  .  Bevacizumab (AVASTIN IV), Place 1 drop into the right eye as directed. Every 6-8 weeks .  clobetasol cream (TEMOVATE) [id number] %, Apply 1 application topically daily as needed (for contact dermatitis). Contact dermatitis .  famotidine (PEPCID) 20 MG tablet, Take 1 tablet (20 mg total) by mouth 2 (two) times daily. Marland Kitchen  latanoprost (XALATAN) 0.005 % ophthalmic solution, INSTILL 1 DROP INTO BOTH EYES AT BEDTIME .  Multiple Vitamins-Minerals (ICAPS AREDS 2 PO), Take 2 capsules by mouth daily. Marland Kitchen  tiZANidine (ZANAFLEX) 4 MG tablet, Take 1 tablet (4 mg total) by mouth every 6 (six) hours as needed for muscle spasms. Marland Kitchen  glucose blood (ONE TOUCH ULTRA TEST) test strip, Use as directed once daily to check blood sugar.  Diagnosis code E11.42  Hyperlipidemia Lab  Results  Component Value Date   LDLCALC 100 (H) 10/09/2020   Stable, pt to continue current low chol diet, declines statin   DM type 2 with diabetic peripheral neuropathy Lab Results  Component Value Date   HGBA1C 6.5 10/09/2020   Stable, pt to continue current medical treatment  - metformin   Benign hypertensive heart disease without heart failure BP Readings from Last 3 Encounters:  10/09/20 114/78  09/10/20 126/80  05/28/20 138/70   Stable, pt to continue medical treatment norvasc, toprol   Anxiety and depression Chronic mild persistent, declines change in tx at this time such as med or counseling  referral  Followup: Return in about 6 months (around 04/10/2021).  Cathlean Cower, MD 10/13/2020 6:31 PM [facility]

## 2020-10-09 NOTE — Patient Instructions (Signed)

## 2020-10-13 ENCOUNTER — Encounter: Payer: Self-pay | Admitting: Internal Medicine

## 2020-10-13 NOTE — Assessment & Plan Note (Signed)
Chronic mild persistent, declines change in tx at this time such as med or counseling referral

## 2020-10-13 NOTE — Assessment & Plan Note (Addendum)
Lab Results  Component Value Date   LDLCALC 100 (H) 10/09/2020   Stable, pt to continue current low chol diet, declines statin

## 2020-10-13 NOTE — Assessment & Plan Note (Signed)
BP Readings from Last 3 Encounters:  10/09/20 114/78  09/10/20 126/80  05/28/20 138/70   Stable, pt to continue medical treatment norvasc, toprol

## 2020-10-13 NOTE — Assessment & Plan Note (Signed)
With 2 recent without injury, declines PT referral

## 2020-10-13 NOTE — Assessment & Plan Note (Signed)
To cont low chol diet, exercise, declines statin

## 2020-10-13 NOTE — Assessment & Plan Note (Signed)
Lab Results  Component Value Date   TSH 1.75 10/09/2020   Stable, pt to continue levothyroxine  Current Outpatient Medications (Endocrine & Metabolic):  .  levothyroxine (SYNTHROID) 125 MCG tablet, TAKE 1 TABLET EVERY DAY BEFORE BREAKFAST .  metFORMIN (GLUCOPHAGE) 1000 MG tablet, TAKE 1 TABLET EVERY DAY  Current Outpatient Medications (Cardiovascular):  .  amLODipine (NORVASC) 10 MG tablet, TAKE 1 TABLET EVERY MORNING .  metoprolol succinate (TOPROL-XL) 25 MG 24 hr tablet, TAKE 1 TABLET EVERY MORNING   Current Outpatient Medications (Analgesics):  .  aspirin 81 MG tablet, Take 81 mg by mouth daily.  Current Outpatient Medications (Hematological):  .  clopidogrel (PLAVIX) 75 MG tablet, Take 1 tablet (75 mg total) by mouth daily.  Current Outpatient Medications (Other):  .  Bevacizumab (AVASTIN IV), Place 1 drop into the right eye as directed. Every 6-8 weeks .  clobetasol cream (TEMOVATE) 6.76 %, Apply 1 application topically daily as needed (for contact dermatitis). Contact dermatitis .  famotidine (PEPCID) 20 MG tablet, Take 1 tablet (20 mg total) by mouth 2 (two) times daily. Marland Kitchen  latanoprost (XALATAN) 0.005 % ophthalmic solution, INSTILL 1 DROP INTO BOTH EYES AT BEDTIME .  Multiple Vitamins-Minerals (ICAPS AREDS 2 PO), Take 2 capsules by mouth daily. Marland Kitchen  tiZANidine (ZANAFLEX) 4 MG tablet, Take 1 tablet (4 mg total) by mouth every 6 (six) hours as needed for muscle spasms. Marland Kitchen  glucose blood (ONE TOUCH ULTRA TEST) test strip, Use as directed once daily to check blood sugar.  Diagnosis code E11.42

## 2020-10-13 NOTE — Assessment & Plan Note (Signed)
Lab Results  Component Value Date   HGBA1C 6.5 10/09/2020   Stable, pt to continue current medical treatment  - metformin

## 2020-10-14 ENCOUNTER — Ambulatory Visit (INDEPENDENT_AMBULATORY_CARE_PROVIDER_SITE_OTHER): Payer: Medicare Other | Admitting: Podiatry

## 2020-10-14 ENCOUNTER — Other Ambulatory Visit: Payer: Self-pay

## 2020-10-14 DIAGNOSIS — B351 Tinea unguium: Secondary | ICD-10-CM

## 2020-10-14 DIAGNOSIS — Z7901 Long term (current) use of anticoagulants: Secondary | ICD-10-CM | POA: Diagnosis not present

## 2020-10-14 DIAGNOSIS — E1140 Type 2 diabetes mellitus with diabetic neuropathy, unspecified: Secondary | ICD-10-CM | POA: Diagnosis not present

## 2020-10-14 DIAGNOSIS — M79676 Pain in unspecified toe(s): Secondary | ICD-10-CM

## 2020-10-14 NOTE — Progress Notes (Signed)
Subjective: 87 y.o. returns the office today for painful, elongated, thickened toenails which he cannot trim himself. Denies any redness or drainage around the nails. Denies any acute changes since last appointment and no new complaints today. Denies any systemic complaints such as fevers, chills, nausea, vomiting.   PCP: Biagio Borg, MD A1c: 6.8  Objective: NAD DP/PT pulses palpable, CRT less than 3 seconds Nails hypertrophic, dystrophic, elongated, brittle, discolored 10. There is tenderness overlying the nails 1-5 bilaterally. There is no surrounding erythema or drainage along the nail sites. No open lesions or pre-ulcerative lesions are identified. No other areas of tenderness bilateral lower extremities. No overlying edema, erythema, increased warmth. No pain with calf compression, swelling, warmth, erythema.  Assessment: Patient presents with symptomatic onychomycosis  Plan: -Treatment options including alternatives, risks, complications were discussed -Nails sharply debrided 10 without complication/bleeding. -Discussed daily foot inspection. If there are any changes, to call the office immediately.  -Follow-up in 3 months or sooner if any problems are to arise. In the meantime, encouraged to call the office with any questions, concerns, changes symptoms.  Celesta Gentile, DPM

## 2020-11-15 NOTE — Progress Notes (Signed)
HPI: Follow-up hypertension and palpitations. Also history of TIA. Nuclear study March 2017 showed ejection fraction 62% and no ischemia.  Carotid Dopplers May 2020 showed 1 to 39% bilateral stenosis. Question episodes of syncope at previous ov. Echo 4/21 showed normal LV function; grade 2 DD; trace AI and mild AS with mean gradient 12 mmHg. Monitor 4/21 showed sinus with PACs, PVCs brief PAT but no pauses. Since last seen, he denies dyspnea, chest pain, palpitations or syncope.  He has problems with balance and falls frequently by his report.  Current Outpatient Medications  Medication Sig Dispense Refill   amLODipine (NORVASC) 10 MG tablet TAKE 1 TABLET EVERY MORNING 90 tablet 0   aspirin 81 MG tablet Take 81 mg by mouth daily.     Bevacizumab (AVASTIN IV) Place 1 drop into the right eye as directed. Every 6-8 weeks     clobetasol cream (TEMOVATE) 0.16 % Apply 1 application topically daily as needed (for contact dermatitis). Contact dermatitis  3   clopidogrel (PLAVIX) 75 MG tablet Take 1 tablet (75 mg total) by mouth daily. 90 tablet 3   famotidine (PEPCID) 20 MG tablet Take 1 tablet (20 mg total) by mouth 2 (two) times daily. 180 tablet 3   glucose blood (ONE TOUCH ULTRA TEST) test strip Use as directed once daily to check blood sugar.  Diagnosis code E11.42 100 each 11   latanoprost (XALATAN) 0.005 % ophthalmic solution INSTILL 1 DROP INTO BOTH EYES AT BEDTIME 2.5 mL 12   levothyroxine (SYNTHROID) 125 MCG tablet TAKE 1 TABLET EVERY DAY BEFORE BREAKFAST 90 tablet 3   metFORMIN (GLUCOPHAGE) 1000 MG tablet TAKE 1 TABLET EVERY DAY 90 tablet 1   metoprolol succinate (TOPROL-XL) 25 MG 24 hr tablet TAKE 1 TABLET EVERY MORNING 90 tablet 2   Multiple Vitamins-Minerals (ICAPS AREDS 2 PO) Take 2 capsules by mouth daily.     tiZANidine (ZANAFLEX) 4 MG tablet Take 1 tablet (4 mg total) by mouth every 6 (six) hours as needed for muscle spasms. 360 tablet 3   No current facility-administered  medications for this visit.     Past Medical History:  Diagnosis Date   Acute blood loss anemia 05/15/2011   post surgical   Altered mental status 05/15/2011   Anxiety    B12 deficiency 09/23/2016   Blood transfusion without reported diagnosis    Cancer ([place])    basal and squamous skin carcinoma    Cataract    Chronic kidney disease    hx of uti due to catheter hx of prostatitis, BPH -    Depression    Diabetes mellitus    GERD (gastroesophageal reflux disease)    Glaucoma 01/04/2013   Heart murmur    History of nuclear stress test    Myoview 3/17: EF 62%, no ischemia or scar, low risk   Hx of transient ischemic attack (TIA) 01/04/2013   Hx: UTI (urinary tract infection)    Hyperlipidemia    Hypertension    Hypokalemia 05/15/2011   Hypothyroidism    Insomnia    Macular degeneration    Neuropathy    OA (osteoarthritis)    Pneumonia    PVC's (premature ventricular contractions)    Renal mass    Stroke ([place])    mid 1990s     Past Surgical History:  Procedure Laterality Date   APPENDECTOMY     CATARACT EXTRACTION W/ INTRAOCULAR LENS  IMPLANT, BILATERAL     INGUINAL HERNIA REPAIR  X3   LUMBAR LAMINECTOMY/DECOMPRESSION MICRODISCECTOMY N/A 02/12/2017   Procedure: Lumbar Three-Four, Lumbar Four-Five, Lumbar Five-Sacral One Laminectomy and Foraminotomy;  Surgeon: Earnie Larsson, MD;  Location: [facility];  Service: Neurosurgery;  Laterality: N/A;   OTHER SURGICAL HISTORY     microwave procedure for prostate - 09/12    PROSTATE ABLATION     microwave   REPLACEMENT TOTAL KNEE     THYROIDECTOMY     TONSILLECTOMY AND ADENOIDECTOMY     TOTAL KNEE ARTHROPLASTY  05/11/2011   Procedure: TOTAL KNEE ARTHROPLASTY;  Surgeon: Gearlean Alf;  Location: [facility];  Service: Orthopedics;  Laterality: Right;   US ECHOCARDIOGRAPHY  11/10/2006   EF 55-60%   US ECHOCARDIOGRAPHY  09/20/2002   EF 65-70%    Social History   Socioeconomic History   Marital status: Married    Spouse name: Not on  file   Number of children: 3   Years of education: Not on file   Highest education level: Not on file  Occupational History   Occupation: retired    Fish farm manager: RETIRED    Comment: [employer]  Tobacco Use   Smoking status: Never   Smokeless tobacco: Never  Vaping Use   Vaping Use: Never used  Substance and Sexual Activity   Alcohol use: Yes    Alcohol/week: 7.0 standard drinks    Types: 7 Shots of liquor per week    Comment: 2 drinks most days   Drug use: No   Sexual activity: Never    Birth control/protection: None  Other Topics Concern   Not on file  Social History Narrative   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, 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[employer]      Home Situation: living with wife      Spiritual Beliefs: episcopalian      Lifestyle: walks daily, working on diet            Social Determinants of Radio broadcast assistant Strain: Low Risk    Difficulty of Paying Living Expenses: Not hard at all  Food Insecurity: No Food Insecurity   Worried About Charity fundraiser in the Last Year: Never true   Arboriculturist in the Last Year: Never true  Transportation Needs: No Transportation Needs   Lack of Transportation (Medical): No   Lack of Transportation (Non-Medical): No  Physical Activity: Inactive   Days of Exercise per Week: 0 days   Minutes of Exercise per Session: 0 min  Stress: No Stress Concern Present   Feeling of Stress : Not at all  Social Connections: Not on file  Intimate Partner Violence: Not on file    Family History  Problem Relation Age of Onset   Hypertension Mother    Arthritis Mother    Diabetes Mother    Hypertension Father    Stroke Father    Kidney disease Father    Hypertension Brother    Diabetes Brother     ROS: Fatigue but no fevers or chills, productive cough, hemoptysis, dysphasia, odynophagia, melena, hematochezia, dysuria, hematuria, rash, seizure activity, orthopnea, PND, pedal edema, claudication. Remaining systems are  negative.  Physical Exam: Well-developed well-nourished in no acute distress.  Skin is warm and dry.  HEENT is normal.  Neck is supple.  Chest is clear to auscultation with normal expansion.  Cardiovascular exam is regular rate and rhythm.  2/6 systolic murmur left sternal border.  S2 is not diminished. Abdominal exam nontender or distended. No  masses palpated. Extremities show no edema. neuro grossly intact   A/P  1 question history of syncope-patient has had no recurrences and previous evaluation negative.  2 history of aortic stenosis-will arrange fu echo when he returns in 6 months.  3 hypertension-blood pressure controlled.  Continue present medications and follow.  4 palpitations-controlled.  Continue beta-blocker.  5 prior TIA-continue aspirin and Plavix.  6 hyperlipidemia-Per primary care.  Kirk Ruths, MD

## 2020-11-26 ENCOUNTER — Other Ambulatory Visit: Payer: Self-pay

## 2020-11-26 ENCOUNTER — Ambulatory Visit (INDEPENDENT_AMBULATORY_CARE_PROVIDER_SITE_OTHER): Payer: Medicare Other | Admitting: Ophthalmology

## 2020-11-26 ENCOUNTER — Encounter (INDEPENDENT_AMBULATORY_CARE_PROVIDER_SITE_OTHER): Payer: Self-pay | Admitting: Ophthalmology

## 2020-11-26 DIAGNOSIS — H35372 Puckering of macula, left eye: Secondary | ICD-10-CM | POA: Diagnosis not present

## 2020-11-26 DIAGNOSIS — H353211 Exudative age-related macular degeneration, right eye, with active choroidal neovascularization: Secondary | ICD-10-CM

## 2020-11-26 DIAGNOSIS — H353124 Nonexudative age-related macular degeneration, left eye, advanced atrophic with subfoveal involvement: Secondary | ICD-10-CM | POA: Diagnosis not present

## 2020-11-26 MED ORDER — BEVACIZUMAB 2.5 MG/0.1ML IZ SOSY
2.5000 mg | PREFILLED_SYRINGE | INTRAVITREAL | Status: AC | PRN
  Administered 2020-11-26: 2.5 mg via INTRAVITREAL

## 2020-11-26 NOTE — Assessment & Plan Note (Signed)
Geographic atrophy limits acuity OS

## 2020-11-26 NOTE — Assessment & Plan Note (Signed)
Parafoveal atrophy some impact on acuity yet no signs of recurrence of CNVM at this longer interval of 8 weeks.  Stephen Wilkinson injection Avastin today for history of multiple recurrences of CNVM

## 2020-11-26 NOTE — Progress Notes (Signed)
11/26/2020     CHIEF COMPLAINT Patient presents for Retina Follow Up (8 Wk F/U OD, poss Avastin OD//Pt sts VA fluctuates OU. No new symptoms reported OU. Pt sts he recently found out he is low in Vitamin D.)   HISTORY OF PRESENT ILLNESS: Stephen Wilkinson is a 87 y.o. male who presents to the clinic today for:   HPI     Retina Follow Up           Diagnosis: Wet AMD   Laterality: right eye   Onset: 8 weeks ago   Severity: moderate   Duration: 8 weeks   Course: stable   Comments: 8 Wk F/U OD, poss Avastin OD  Pt sts VA fluctuates OU. No new symptoms reported OU. Pt sts he recently found out he is low in Vitamin D.         Comments   No change in acuity noted OD      Last edited by Hurman Horn, MD on 11/26/2020 11:10 AM.      Referring physician: Biagio Borg, MD [place],  [place] [address]  HISTORICAL INFORMATION:   Selected notes from the MEDICAL RECORD NUMBER    Lab Results  Component Value Date   HGBA1C 6.5 10/09/2020     CURRENT MEDICATIONS: Current Outpatient Medications (Ophthalmic Drugs)  Medication Sig   latanoprost (XALATAN) 0.005 % ophthalmic solution INSTILL 1 DROP INTO BOTH EYES AT BEDTIME   No current facility-administered medications for this visit. (Ophthalmic Drugs)   Current Outpatient Medications (Other)  Medication Sig   amLODipine (NORVASC) 10 MG tablet TAKE 1 TABLET EVERY MORNING   aspirin 81 MG tablet Take 81 mg by mouth daily.   Bevacizumab (AVASTIN IV) Place 1 drop into the right eye as directed. Every 6-8 weeks   clobetasol cream (TEMOVATE) 3.14 % Apply 1 application topically daily as needed (for contact dermatitis). Contact dermatitis   clopidogrel (PLAVIX) 75 MG tablet Take 1 tablet (75 mg total) by mouth daily.   famotidine (PEPCID) 20 MG tablet Take 1 tablet (20 mg total) by mouth 2 (two) times daily.   glucose blood (ONE TOUCH ULTRA TEST) test strip Use as directed once daily to check blood sugar.  Diagnosis  code E11.42   levothyroxine (SYNTHROID) 125 MCG tablet TAKE 1 TABLET EVERY DAY BEFORE BREAKFAST   metFORMIN (GLUCOPHAGE) 1000 MG tablet TAKE 1 TABLET EVERY DAY   metoprolol succinate (TOPROL-XL) 25 MG 24 hr tablet TAKE 1 TABLET EVERY MORNING   Multiple Vitamins-Minerals (ICAPS AREDS 2 PO) Take 2 capsules by mouth daily.   tiZANidine (ZANAFLEX) 4 MG tablet Take 1 tablet (4 mg total) by mouth every 6 (six) hours as needed for muscle spasms.   No current facility-administered medications for this visit. (Other)      REVIEW OF SYSTEMS:    ALLERGIES No Known Allergies  PAST MEDICAL HISTORY Past Medical History:  Diagnosis Date   Acute blood loss anemia 05/15/2011   post surgical   Altered mental status 05/15/2011   Anxiety    B12 deficiency 09/23/2016   Blood transfusion without reported diagnosis    Cancer ([place])    basal and squamous skin carcinoma    Cataract    Chronic kidney disease    hx of uti due to catheter hx of prostatitis, BPH -    Depression    Diabetes mellitus    GERD (gastroesophageal reflux disease)    Glaucoma 01/04/2013   Heart murmur  History of nuclear stress test    Myoview 3/17: EF 62%, no ischemia or scar, low risk   Hx of transient ischemic attack (TIA) 01/04/2013   Hx: UTI (urinary tract infection)    Hyperlipidemia    Hypertension    Hypokalemia 05/15/2011   Hypothyroidism    Insomnia    Macular degeneration    Neuropathy    OA (osteoarthritis)    Pneumonia    PVC's (premature ventricular contractions)    Renal mass    Stroke ([place])    mid 1990s    Past Surgical History:  Procedure Laterality Date   APPENDECTOMY     CATARACT EXTRACTION W/ INTRAOCULAR LENS  IMPLANT, BILATERAL     INGUINAL HERNIA REPAIR     X3   LUMBAR LAMINECTOMY/DECOMPRESSION MICRODISCECTOMY N/A 02/12/2017   Procedure: Lumbar Three-Four, Lumbar Four-Five, Lumbar Five-Sacral One Laminectomy and Foraminotomy;  Surgeon: Earnie Larsson, MD;  Location: [facility];  Service:  Neurosurgery;  Laterality: N/A;   OTHER SURGICAL HISTORY     microwave procedure for prostate - 09/12    PROSTATE ABLATION     microwave   REPLACEMENT TOTAL KNEE     THYROIDECTOMY     TONSILLECTOMY AND ADENOIDECTOMY     TOTAL KNEE ARTHROPLASTY  05/11/2011   Procedure: TOTAL KNEE ARTHROPLASTY;  Surgeon: Gearlean Alf;  Location[facility];  Service: Orthopedics;  Laterality: Right;   US ECHOCARDIOGRAPHY  11/10/2006   EF 55-60%   US ECHOCARDIOGRAPHY  09/20/2002   EF 65-70%    FAMILY HISTORY Family History  Problem Relation Age of Onset   Hypertension Mother    Arthritis Mother    Diabetes Mother    Hypertension Father    Stroke Father    Kidney disease Father    Hypertension Brother    Diabetes Brother     SOCIAL HISTORY Social History   Tobacco Use   Smoking status: Never   Smokeless tobacco: Never  Vaping Use   Vaping Use: Never used  Substance Use Topics   Alcohol use: Yes    Alcohol/week: 7.0 standard drinks    Types: 7 Shots of liquor per week    Comment: 2 drinks most days   Drug use: No         OPHTHALMIC EXAM:  Base Eye Exam     Visual Acuity (ETDRS)       Right Left   Dist cc 20/40 +2 CF at 3'   Dist ph cc NI NI    Correction: Glasses         Tonometry (Tonopen, 10:27 AM)       Right Left   Pressure 17 19         Pupils       Pupils Dark Light Shape React APD   Right PERRL 4 3 Round Brisk None   Left PERRL 4 3 Round Brisk None         Visual Fields (Counting fingers)       Left Right    Full Full         Extraocular Movement       Right Left    Full Full         Neuro/Psych     Oriented x3: Yes   Mood/Affect: Normal         Dilation     Right eye: 1.0% Mydriacyl, 2.5% Phenylephrine @ 10:27 AM           Slit Lamp and Fundus  Exam     External Exam       Right Left   External Normal Normal         Slit Lamp Exam       Right Left   Lids/Lashes Normal Normal   Conjunctiva/Sclera White and  quiet White and quiet   Cornea Clear Clear   Anterior Chamber Deep and quiet Deep and quiet   Iris Round and reactive Round and reactive   Lens Posterior chamber intraocular lens Posterior chamber intraocular lens   Anterior Vitreous Normal Normal         Fundus Exam       Right Left   Posterior Vitreous Normal    Disc Normal    C/D Ratio 0.35    Macula Advanced age related macular degeneration, Geographic atrophy, Hard drusen, no exudates, no clinical Subretinal neovascular membrane, no hemorrhage, Retinal pigment epithelial mottling, Retinal pigment epithelial atrophy    Vessels Normal    Periphery Normal             IMAGING AND PROCEDURES  Imaging and Procedures for 11/26/20  OCT, Retina - OU - Both Eyes       Right Eye Quality was good. Scan locations included subfoveal. Central Foveal Thickness: 225. Progression has improved. Findings include abnormal foveal contour, outer retinal atrophy, inner retinal atrophy.   Left Eye Quality was good. Scan locations included subfoveal. Central Foveal Thickness: 266. Progression has been stable. Findings include epiretinal membrane, outer retinal atrophy, inner retinal atrophy.   Notes OS with minor epiretinal membrane nasal aspect of fovea yet with atrophy no or intervention  OD, no sign of recurrence of CNVM 8-week interval today             ASSESSMENT/PLAN:  Advanced nonexudative age-related macular degeneration of left eye with subfoveal involvement Geographic atrophy limits acuity OS  Exudative age-related macular degeneration of right eye with active choroidal neovascularization ([place]) Parafoveal atrophy some impact on acuity yet no signs of recurrence of CNVM at this longer interval of 8 weeks.  Pete injection Avastin today for history of multiple recurrences of CNVM     ICD-10-CM   1. Exudative age-related macular degeneration of right eye with active choroidal neovascularization (HCC)  H35.3211 OCT, Retina  - OU - Both Eyes    2. Advanced nonexudative age-related macular degeneration of left eye with subfoveal involvement  H35.3124     3. Epiretinal membrane, left eye  H35.372       1.  OD with a history of multiple recurrences of CNVM and this monocular patient with preserved acuity.  Repeat injection today at 8-week interval post Avastin.  2.  Maintain 8-week interval as in the past 6 or 7 weeks with the longest duration we could safely use between exam  3.  OS geographic atrophy limits acuity stable  Ophthalmic Meds Ordered this visit:  No orders of the defined types were placed in this encounter.      Return in about 8 weeks (around 01/21/2021) for DILATE OU, AVASTIN OCT, OD.  There are no Patient Instructions on file for this visit.   Explained the diagnoses, plan, and follow up with the patient and they expressed understanding.  Patient expressed understanding of the importance of proper follow up care.   Clent Demark Kameria Canizares M.D. Diseases & Surgery of the Retina and Vitreous [facility]ina [facility] Diabetic [place] 11/26/20     Abbreviations: M myopia (nearsighted); A astigmatism; H hyperopia (farsighted); P presbyopia; Mrx spectacle  prescription;  CTL contact lenses; OD right eye; OS left eye; OU both eyes  XT exotropia; ET esotropia; PEK punctate epithelial keratitis; PEE punctate epithelial erosions; DES dry eye syndrome; MGD meibomian gland dysfunction; ATs artificial tears; PFAT's preservative free artificial tears; [place] nuclear sclerotic cataract; PSC posterior subcapsular cataract; ERM epi-retinal membrane; PVD posterior vitreous detachment; RD retinal detachment; DM diabetes mellitus; DR diabetic retinopathy; NPDR non-proliferative diabetic retinopathy; PDR proliferative diabetic retinopathy; CSME clinically significant macular edema; DME diabetic macular edema; dbh dot blot hemorrhages; CWS cotton wool spot; POAG primary open angle glaucoma; C/D cup-to-disc ratio; HVF humphrey visual  field; GVF goldmann visual field; OCT optical coherence tomography; IOP intraocular pressure; BRVO Branch retinal vein occlusion; CRVO central retinal vein occlusion; CRAO central retinal artery occlusion; BRAO branch retinal artery occlusion; RT retinal tear; SB scleral buckle; PPV pars plana vitrectomy; VH Vitreous hemorrhage; PRP panretinal laser photocoagulation; IVK intravitreal kenalog; VMT vitreomacular traction; MH Macular hole;  NVD neovascularization of the disc; NVE neovascularization elsewhere; AREDS age related eye disease study; ARMD age related macular degeneration; POAG primary open angle glaucoma; EBMD epithelial/anterior basement membrane dystrophy; ACIOL anterior chamber intraocular lens; IOL intraocular lens; PCIOL posterior chamber intraocular lens; Phaco/IOL phacoemulsification with intraocular lens placement; [place] photorefractive keratectomy; LASIK laser assisted in situ keratomileusis; HTN hypertension; DM diabetes mellitus; COPD chronic obstructive pulmonary disease

## 2020-11-28 ENCOUNTER — Encounter: Payer: Self-pay | Admitting: Cardiology

## 2020-11-28 ENCOUNTER — Ambulatory Visit (INDEPENDENT_AMBULATORY_CARE_PROVIDER_SITE_OTHER): Payer: Medicare Other | Admitting: Cardiology

## 2020-11-28 ENCOUNTER — Other Ambulatory Visit: Payer: Self-pay

## 2020-11-28 VITALS — BP 130/74 | HR 62 | Ht 73.0 in | Wt 176.4 lb

## 2020-11-28 DIAGNOSIS — I350 Nonrheumatic aortic (valve) stenosis: Secondary | ICD-10-CM | POA: Diagnosis not present

## 2020-11-28 DIAGNOSIS — R55 Syncope and collapse: Secondary | ICD-10-CM

## 2020-11-28 DIAGNOSIS — E7800 Pure hypercholesterolemia, unspecified: Secondary | ICD-10-CM

## 2020-11-28 DIAGNOSIS — I10 Essential (primary) hypertension: Secondary | ICD-10-CM

## 2020-11-28 NOTE — Patient Instructions (Signed)

## 2020-11-30 ENCOUNTER — Other Ambulatory Visit: Payer: Self-pay | Admitting: Internal Medicine

## 2020-12-24 ENCOUNTER — Other Ambulatory Visit: Payer: Self-pay | Admitting: Cardiology

## 2020-12-24 DIAGNOSIS — I119 Hypertensive heart disease without heart failure: Secondary | ICD-10-CM

## 2021-01-14 ENCOUNTER — Ambulatory Visit (INDEPENDENT_AMBULATORY_CARE_PROVIDER_SITE_OTHER): Payer: Medicare Other | Admitting: Podiatry

## 2021-01-14 ENCOUNTER — Other Ambulatory Visit: Payer: Self-pay

## 2021-01-14 ENCOUNTER — Encounter: Payer: Self-pay | Admitting: Podiatry

## 2021-01-14 DIAGNOSIS — Z7901 Long term (current) use of anticoagulants: Secondary | ICD-10-CM | POA: Diagnosis not present

## 2021-01-14 DIAGNOSIS — M79676 Pain in unspecified toe(s): Secondary | ICD-10-CM

## 2021-01-14 DIAGNOSIS — E1140 Type 2 diabetes mellitus with diabetic neuropathy, unspecified: Secondary | ICD-10-CM

## 2021-01-14 DIAGNOSIS — L84 Corns and callosities: Secondary | ICD-10-CM

## 2021-01-14 DIAGNOSIS — B351 Tinea unguium: Secondary | ICD-10-CM

## 2021-01-14 NOTE — Progress Notes (Signed)
Subjective: 87 y.o. returns the office today for painful, elongated, thickened toenails which he cannot trim himself.  Also has a painful skin lesion on the bottom of his right heel which is causing discomfort.  Denies any redness or drainage around the nails. Denies any acute changes since last appointment and no new complaints today. Denies any systemic complaints such as fevers, chills, nausea, vomiting.   PCP: Biagio Borg, MD- last seen 10/09/2020 A1c: 6.5 on 10/09/2020  Objective: NAD DP/PT pulses palpable, CRT less than 3 seconds Nails hypertrophic, dystrophic, elongated, brittle, discolored 10. There is tenderness overlying the nails 1-5 bilaterally. There is no surrounding erythema or drainage along the nail sites. Hyperkeratotic lesion plantar right heel without any underlying ulceration drainage or signs of infection. No open lesions or pre-ulcerative lesions are identified. No pain with calf compression, swelling, warmth, erythema.  Assessment: Patient presents with symptomatic onychomycosis, hyperkeratotic lesion; on Plavix  Plan: -Treatment options including alternatives, risks, complications were discussed -Nails sharply debrided 10 without complication/bleeding. -Hyperkeratotic lesion sharply debrided x1 without any complications or bleeding.  Moisturizer and offloading. -Discussed daily foot inspection. If there are any changes, to call the office immediately.  -Follow-up in 3 months or sooner if any problems are to arise. In the meantime, encouraged to call the office with any questions, concerns, changes symptoms.  Celesta Gentile, DPM

## 2021-01-16 ENCOUNTER — Other Ambulatory Visit (INDEPENDENT_AMBULATORY_CARE_PROVIDER_SITE_OTHER): Payer: Medicare Other | Admitting: Ophthalmology

## 2021-01-16 MED ORDER — LATANOPROST 0.005 % OP SOLN: 1.0000 [drp] | Freq: Every day | OPHTHALMIC | 12 refills | Status: AC

## 2021-01-16 NOTE — Progress Notes (Signed)
Refill request received via fax to yesterday at 4:30 PM reviewed this morning yet patient and pharmacy on the phone calling several times on the morning of 01-16-2021, asking for prompt refill of this and somewhat of harsh tones with the staff

## 2021-01-21 ENCOUNTER — Ambulatory Visit (INDEPENDENT_AMBULATORY_CARE_PROVIDER_SITE_OTHER): Payer: Medicare Other | Admitting: Ophthalmology

## 2021-01-21 ENCOUNTER — Other Ambulatory Visit: Payer: Self-pay

## 2021-01-21 ENCOUNTER — Encounter (INDEPENDENT_AMBULATORY_CARE_PROVIDER_SITE_OTHER): Payer: Self-pay | Admitting: Ophthalmology

## 2021-01-21 DIAGNOSIS — H401132 Primary open-angle glaucoma, bilateral, moderate stage: Secondary | ICD-10-CM

## 2021-01-21 DIAGNOSIS — H353211 Exudative age-related macular degeneration, right eye, with active choroidal neovascularization: Secondary | ICD-10-CM | POA: Diagnosis not present

## 2021-01-21 DIAGNOSIS — H353124 Nonexudative age-related macular degeneration, left eye, advanced atrophic with subfoveal involvement: Secondary | ICD-10-CM | POA: Diagnosis not present

## 2021-01-21 MED ORDER — BEVACIZUMAB 2.5 MG/0.1ML IZ SOSY
2.5000 mg | PREFILLED_SYRINGE | INTRAVITREAL | Status: AC | PRN
  Administered 2021-01-21: 2.5 mg via INTRAVITREAL

## 2021-01-21 NOTE — Assessment & Plan Note (Signed)
OS, geographic atrophy limits acuity

## 2021-01-21 NOTE — Progress Notes (Signed)
01/21/2021     CHIEF COMPLAINT Patient presents for Retina Follow Up (8 week fu OU and Avastin OD/Pt states, "I think I did better when my shots were every 7 weeks. I am seeing more floaters and my vision is sort of blurred."/Pt states that he uses Latanoprost QHS OU and needs a new rx)   HISTORY OF PRESENT ILLNESS: Stephen Wilkinson is a 87 y.o. male who presents to the clinic today for:   HPI     Retina Follow Up           Diagnosis: Wet AMD   Laterality: right eye   Onset: 8 weeks ago   Severity: mild   Duration: 8 weeks   Course: stable   Comments: 8 week fu OU and Avastin OD Pt states, "I think I did better when my shots were every 7 weeks. I am seeing more floaters and my vision is sort of blurred." Pt states that he uses Latanoprost QHS OU and needs a new rx       Last edited by Kendra Opitz, COA on 01/21/2021 10:11 AM.      Referring physician: Biagio Borg, MD [place],  [place] [address]  HISTORICAL INFORMATION:   Selected notes from the MEDICAL RECORD NUMBER    Lab Results  Component Value Date   HGBA1C 6.5 10/09/2020     CURRENT MEDICATIONS: Current Outpatient Medications (Ophthalmic Drugs)  Medication Sig   latanoprost (XALATAN) 0.005 % ophthalmic solution Place 1 drop into both eyes at bedtime.   No current facility-administered medications for this visit. (Ophthalmic Drugs)   Current Outpatient Medications (Other)  Medication Sig   amLODipine (NORVASC) 10 MG tablet TAKE 1 TABLET EVERY MORNING   aspirin 81 MG tablet Take 81 mg by mouth daily.   Bevacizumab (AVASTIN IV) Place 1 drop into the right eye as directed. Every 6-8 weeks   clobetasol cream (TEMOVATE) [id number] % Apply 1 application topically daily as needed (for contact dermatitis). Contact dermatitis   clopidogrel (PLAVIX) 75 MG tablet TAKE 1 TABLET EVERY DAY   famotidine (PEPCID) 20 MG tablet Take 1 tablet (20 mg total) by mouth 2 (two) times daily.   glucose blood (ONE TOUCH  ULTRA TEST) test strip Use as directed once daily to check blood sugar.  Diagnosis code E11.42   levothyroxine (SYNTHROID) 125 MCG tablet TAKE 1 TABLET EVERY DAY BEFORE BREAKFAST   metFORMIN (GLUCOPHAGE) 1000 MG tablet TAKE 1 TABLET EVERY DAY   metoprolol succinate (TOPROL-XL) 25 MG 24 hr tablet TAKE 1 TABLET EVERY MORNING   Multiple Vitamins-Minerals (ICAPS AREDS 2 PO) Take 2 capsules by mouth daily.   tiZANidine (ZANAFLEX) 4 MG tablet TAKE 1 TABLET (4 MG TOTAL) BY MOUTH EVERY 6 (SIX) HOURS AS NEEDED FOR MUSCLE SPASMS.   No current facility-administered medications for this visit. (Other)      REVIEW OF SYSTEMS:    ALLERGIES No Known Allergies  PAST MEDICAL HISTORY Past Medical History:  Diagnosis Date   Acute blood loss anemia 05/15/2011   post surgical   Altered mental status 05/15/2011   Anxiety    B12 deficiency 09/23/2016   Blood transfusion without reported diagnosis    Cancer ([facility])    basal and squamous skin carcinoma    Cataract    Chronic kidney disease    hx of uti due to catheter hx of prostatitis, BPH -    Depression    Diabetes mellitus    GERD (gastroesophageal  reflux disease)    Glaucoma 01/04/2013   Heart murmur    History of nuclear stress test    Myoview 3/17: EF 62%, no ischemia or scar, low risk   Hx of transient ischemic attack (TIA) 01/04/2013   Hx: UTI (urinary tract infection)    Hyperlipidemia    Hypertension    Hypokalemia 05/15/2011   Hypothyroidism    Insomnia    Macular degeneration    Neuropathy    OA (osteoarthritis)    Pneumonia    PVC's (premature ventricular contractions)    Renal mass    Stroke ([facility])    mid 1990s    Past Surgical History:  Procedure Laterality Date   APPENDECTOMY     CATARACT EXTRACTION W/ INTRAOCULAR LENS  IMPLANT, BILATERAL     INGUINAL HERNIA REPAIR     X3   LUMBAR LAMINECTOMY/DECOMPRESSION MICRODISCECTOMY N/A 02/12/2017   Procedure: Lumbar Three-Four, Lumbar Four-Five, Lumbar Five-Sacral One  Laminectomy and Foraminotomy;  Surgeon: Earnie Larsson, MD;  Location: [facility];  Service: Neurosurgery;  Laterality: N/A;   OTHER SURGICAL HISTORY     microwave procedure for prostate - 09/12    PROSTATE ABLATION     microwave   REPLACEMENT TOTAL KNEE     THYROIDECTOMY     TONSILLECTOMY AND ADENOIDECTOMY     TOTAL KNEE ARTHROPLASTY  05/11/2011   Procedure: TOTAL KNEE ARTHROPLASTY;  Surgeon: Gearlean Alf;  Location: [facility];  Service: Orthopedics;  Laterality: Right;   US ECHOCARDIOGRAPHY  11/10/2006   EF 55-60%   US ECHOCARDIOGRAPHY  09/20/2002   EF 65-70%    FAMILY HISTORY Family History  Problem Relation Age of Onset   Hypertension Mother    Arthritis Mother    Diabetes Mother    Hypertension Father    Stroke Father    Kidney disease Father    Hypertension Brother    Diabetes Brother     SOCIAL HISTORY Social History   Tobacco Use   Smoking status: Never   Smokeless tobacco: Never  Vaping Use   Vaping Use: Never used  Substance Use Topics   Alcohol use: Yes    Alcohol/week: 7.0 standard drinks    Types: 7 Shots of liquor per week    Comment: 2 drinks most days   Drug use: No         OPHTHALMIC EXAM:  Base Eye Exam     Visual Acuity (ETDRS)       Right Left   Dist cc 20/40 -2 CF at Face   Dist ph cc NI          Tonometry (Tonopen, 10:16 AM)       Right Left   Pressure 14 17         Pupils       Pupils Dark Light Shape React APD   Right PERRL 4 3 Round Brisk None   Left PERRL 4 3 Round Brisk None         Visual Fields       Left Right    Full Full         Extraocular Movement       Right Left    Full Full         Neuro/Psych     Oriented x3: Yes   Mood/Affect: Normal         Dilation     Both eyes: 1.0% Mydriacyl, 2.5% Phenylephrine @ 10:16 AM  Slit Lamp and Fundus Exam     External Exam       Right Left   External Normal Normal         Slit Lamp Exam       Right Left   Lids/Lashes Normal  Normal   Conjunctiva/Sclera White and quiet White and quiet   Cornea Clear Clear   Anterior Chamber Deep and quiet Deep and quiet   Iris Round and reactive Round and reactive   Lens Posterior chamber intraocular lens Posterior chamber intraocular lens   Anterior Vitreous Normal Normal         Fundus Exam       Right Left   Posterior Vitreous Normal Clear and vitrectomized   Disc Normal Normal   C/D Ratio 0.35 0.35   Macula Advanced age related macular degeneration, Geographic atrophy, Hard drusen, no exudates, no clinical Subretinal neovascular membrane, no hemorrhage, Retinal pigment epithelial mottling, Retinal pigment epithelial atrophy Advanced age related macular degeneration, Pigmented atrophy, Retinal pigment epithelial atrophy - geographic   Vessels Normal Normal   Periphery Normal Normal            IMAGING AND PROCEDURES  Imaging and Procedures for 01/21/21  OCT, Retina - OU - Both Eyes       Right Eye Quality was good. Scan locations included subfoveal. Central Foveal Thickness: 261. Progression has improved. Findings include abnormal foveal contour, outer retinal atrophy, inner retinal atrophy.   Left Eye Quality was good. Scan locations included subfoveal. Central Foveal Thickness: 297. Progression has been stable. Findings include epiretinal membrane, outer retinal atrophy, inner retinal atrophy.   Notes OS with minor epiretinal membrane nasal aspect of fovea yet with atrophy no or intervention  OD, no sign of recurrence of CNVM 8-week interval today, stable overall no signs of intraretinal fluid or subretinal fluid     Intravitreal Injection, Pharmacologic Agent - OD - Right Eye       Time Out 01/21/2021. 11:10 AM. Confirmed correct patient, procedure, site, and patient consented.   Anesthesia Anesthetic medications included Proparacaine 0.5%, Akten 3.5%.   Procedure Preparation included 5% betadine to ocular surface, 10% betadine to eyelids,  Tobramycin 0.3%. A 30 gauge needle was used.   Injection: 2.5 mg bevacizumab 2.5 MG/0.1ML   Route: Intravitreal, Site: Right Eye   NDC: [phone], Lot: [id number]   Post-op Post injection exam found visual acuity of at least counting fingers. The patient tolerated the procedure well. There were no complications. The patient received written and verbal post procedure care education. Post injection medications were not given.              ASSESSMENT/PLAN:  Exudative age-related macular degeneration of right eye with active choroidal neovascularization (HCC) OD again at 8 weeks stable, no signs of recurrent CNVM.  I have explained to the patient I am trying to strike the balance of preventing or exacerbating progression of geographic atrophy while controlling CNVM and its recurrence history.  He inquired if it would be appropriate to curtail to cut the interval down to 7 weeks because he is now seeing "floaters".  I explained to the patient that floaters or not a symptom of recurring CNVM.  Advanced nonexudative age-related macular degeneration of left eye with subfoveal involvement OS, geographic atrophy limits acuity     ICD-10-CM   1. Exudative age-related macular degeneration of right eye with active choroidal neovascularization (HCC)  H35.3211 OCT, Retina - OU - Both Eyes    Intravitreal Injection,  Pharmacologic Agent - OD - Right Eye    bevacizumab (AVASTIN) SOSY 2.5 mg    2. Primary open angle glaucoma (POAG) of both eyes, moderate stage  H40.1132     3. Advanced nonexudative age-related macular degeneration of left eye with subfoveal involvement  H35.3124       1.  Repeat injection intravitreal Avastin OD today for history of recurrent CNVM's in the past.  Prior interval of 6 weeks was subsequently extended to 7 weeks and now has on several occasions the interval extend 8 weeks with no signs of recurrences.  I reassured the patient that he is doing extremely well while we  are balancing the potential harmful effects of chronic suppressive panvegF with Avastin and its potential  2.  We will contact Dr. Benna Dunks office on patient's behalf so that he can have his current glaucoma medications rotated to [facility] on    3.  Ophthalmic Meds Ordered this visit:  Meds ordered this encounter  Medications   bevacizumab (AVASTIN) SOSY 2.5 mg       Return in about 8 weeks (around 03/18/2021) for dilate, OD, AVASTIN OCT.  There are no Patient Instructions on file for this visit.   Explained the diagnoses, plan, and follow up with the patient and they expressed understanding.  Patient expressed understanding of the importance of proper follow up care.   Clent Demark Sophira Rumler M.D. Diseases & Surgery of the Retina and Vitreous [facility]ina [facility] [place] 01/21/21     Abbreviations: M myopia (nearsighted); A astigmatism; H hyperopia (farsighted); P presbyopia; Mrx spectacle prescription;  CTL contact lenses; OD right eye; OS left eye; OU both eyes  XT exotropia; ET esotropia; PEK punctate epithelial keratitis; PEE punctate epithelial erosions; DES dry eye syndrome; MGD meibomian gland dysfunction; ATs artificial tears; PFAT's preservative free artificial tears; [place] nuclear sclerotic cataract; PSC posterior subcapsular cataract; ERM epi-retinal membrane; PVD posterior vitreous detachment; RD retinal detachment; DM diabetes mellitus; DR diabetic retinopathy; NPDR non-proliferative diabetic retinopathy; PDR proliferative diabetic retinopathy; CSME clinically significant macular edema; DME diabetic macular edema; dbh dot blot hemorrhages; CWS cotton wool spot; POAG primary open angle glaucoma; C/D cup-to-disc ratio; HVF humphrey visual field; GVF goldmann visual field; OCT optical coherence tomography; IOP intraocular pressure; BRVO Branch retinal vein occlusion; CRVO central retinal vein occlusion; CRAO central retinal artery occlusion; BRAO branch retinal artery occlusion; RT  retinal tear; SB scleral buckle; PPV pars plana vitrectomy; VH Vitreous hemorrhage; PRP panretinal laser photocoagulation; IVK intravitreal kenalog; VMT vitreomacular traction; MH Macular hole;  NVD neovascularization of the disc; NVE neovascularization elsewhere; AREDS age related eye disease study; ARMD age related macular degeneration; POAG primary open angle glaucoma; EBMD epithelial/anterior basement membrane dystrophy; ACIOL anterior chamber intraocular lens; IOL intraocular lens; PCIOL posterior chamber intraocular lens; Phaco/IOL phacoemulsification with intraocular lens placement; [place] photorefractive keratectomy; LASIK laser assisted in situ keratomileusis; HTN hypertension; DM diabetes mellitus; COPD chronic obstructive pulmonary disease

## 2021-01-21 NOTE — Assessment & Plan Note (Signed)
OD again at 8 weeks stable, no signs of recurrent CNVM.  I have explained to the patient I am trying to strike the balance of preventing or exacerbating progression of geographic atrophy while controlling CNVM and its recurrence history.  He inquired if it would be appropriate to curtail to cut the interval down to 7 weeks because he is now seeing "floaters".  I explained to the patient that floaters or not a symptom of recurring CNVM.

## 2021-02-13 ENCOUNTER — Other Ambulatory Visit: Payer: Self-pay | Admitting: Cardiology

## 2021-03-11 ENCOUNTER — Ambulatory Visit (INDEPENDENT_AMBULATORY_CARE_PROVIDER_SITE_OTHER): Payer: Medicare Other | Admitting: Ophthalmology

## 2021-03-11 ENCOUNTER — Encounter (INDEPENDENT_AMBULATORY_CARE_PROVIDER_SITE_OTHER): Payer: Self-pay | Admitting: Ophthalmology

## 2021-03-11 ENCOUNTER — Other Ambulatory Visit: Payer: Self-pay

## 2021-03-11 DIAGNOSIS — H353124 Nonexudative age-related macular degeneration, left eye, advanced atrophic with subfoveal involvement: Secondary | ICD-10-CM | POA: Diagnosis not present

## 2021-03-11 DIAGNOSIS — H353211 Exudative age-related macular degeneration, right eye, with active choroidal neovascularization: Secondary | ICD-10-CM

## 2021-03-11 DIAGNOSIS — H353113 Nonexudative age-related macular degeneration, right eye, advanced atrophic without subfoveal involvement: Secondary | ICD-10-CM | POA: Diagnosis not present

## 2021-03-11 MED ORDER — BEVACIZUMAB 2.5 MG/0.1ML IZ SOSY
2.5000 mg | PREFILLED_SYRINGE | INTRAVITREAL | Status: AC | PRN
  Administered 2021-03-11: 2.5 mg via INTRAVITREAL

## 2021-03-11 NOTE — Progress Notes (Signed)
03/11/2021     CHIEF COMPLAINT Patient presents for  Chief Complaint  Patient presents with   Retina Follow Up      HISTORY OF PRESENT ILLNESS: Per Stephen Wilkinson is a 87 y.o. male who presents to the clinic today for:   HPI     Retina Follow Up   Patient presents with  Wet AMD.  In right eye.  This started 7 weeks ago.  Severity is mild.  Duration of 7 weeks.  Since onset it is stable.        Comments   7 week fu OD and Avastin OD and OCT Pt states VA OU stable since last visit. Pt denies FOL, floaters, or ocular pain OU.  A1C: unknown [id number] Pt reports using Latanoprost QHS OU       Last edited by Kendra Opitz, COA on 03/11/2021 10:33 AM.      Referring physician: Biagio Borg, MD [place],  [place] [address]  HISTORICAL INFORMATION:   Selected notes from the MEDICAL RECORD NUMBER    Lab Results  Component Value Date   HGBA1C 6.5 10/09/2020     CURRENT MEDICATIONS: Current Outpatient Medications (Ophthalmic Drugs)  Medication Sig   latanoprost (XALATAN) 0.005 % ophthalmic solution Place 1 drop into both eyes at bedtime.   No current facility-administered medications for this visit. (Ophthalmic Drugs)   Current Outpatient Medications (Other)  Medication Sig   amLODipine (NORVASC) 10 MG tablet TAKE 1 TABLET EVERY MORNING   aspirin 81 MG tablet Take 81 mg by mouth daily.   Bevacizumab (AVASTIN IV) Place 1 drop into the right eye as directed. Every 6-8 weeks   clobetasol cream (TEMOVATE) 1.83 % Apply 1 application topically daily as needed (for contact dermatitis). Contact dermatitis   clopidogrel (PLAVIX) 75 MG tablet TAKE 1 TABLET EVERY DAY   famotidine (PEPCID) 20 MG tablet Take 1 tablet (20 mg total) by mouth 2 (two) times daily.   glucose blood (ONE TOUCH ULTRA TEST) test strip Use as directed once daily to check blood sugar.  Diagnosis code E11.42   levothyroxine (SYNTHROID) 125 MCG tablet TAKE 1 TABLET EVERY DAY BEFORE BREAKFAST    metFORMIN (GLUCOPHAGE) 1000 MG tablet TAKE 1 TABLET EVERY DAY   metoprolol succinate (TOPROL-XL) 25 MG 24 hr tablet TAKE 1 TABLET EVERY MORNING   Multiple Vitamins-Minerals (ICAPS AREDS 2 PO) Take 2 capsules by mouth daily.   tiZANidine (ZANAFLEX) 4 MG tablet TAKE 1 TABLET (4 MG TOTAL) BY MOUTH EVERY 6 (SIX) HOURS AS NEEDED FOR MUSCLE SPASMS.   No current facility-administered medications for this visit. (Other)      REVIEW OF SYSTEMS:    ALLERGIES No Known Allergies  PAST MEDICAL HISTORY Past Medical History:  Diagnosis Date   Acute blood loss anemia 05/15/2011   post surgical   Altered mental status 05/15/2011   Anxiety    B12 deficiency 09/23/2016   Blood transfusion without reported diagnosis    Cancer ([place])    basal and squamous skin carcinoma    Cataract    Chronic kidney disease    hx of uti due to catheter hx of prostatitis, BPH -    Depression    Diabetes mellitus    GERD (gastroesophageal reflux disease)    Glaucoma 01/04/2013   Heart murmur    History of nuclear stress test    Myoview 3/17: EF 62%, no ischemia or scar, low risk   Hx of transient ischemic attack (TIA)  01/04/2013   Hx: UTI (urinary tract infection)    Hyperlipidemia    Hypertension    Hypokalemia 05/15/2011   Hypothyroidism    Insomnia    Macular degeneration    Neuropathy    OA (osteoarthritis)    Pneumonia    PVC's (premature ventricular contractions)    Renal mass    Stroke ([place])    mid 1990s    Past Surgical History:  Procedure Laterality Date   APPENDECTOMY     CATARACT EXTRACTION W/ INTRAOCULAR LENS  IMPLANT, BILATERAL     INGUINAL HERNIA REPAIR     X3   LUMBAR LAMINECTOMY/DECOMPRESSION MICRODISCECTOMY N/A 02/12/2017   Procedure: Lumbar Three-Four, Lumbar Four-Five, Lumbar Five-Sacral One Laminectomy and Foraminotomy;  Surgeon: Earnie Larsson, MD;  Location: [facility];  Service: Neurosurgery;  Laterality: N/A;   OTHER SURGICAL HISTORY     microwave procedure for prostate - 09/12     PROSTATE ABLATION     microwave   REPLACEMENT TOTAL KNEE     THYROIDECTOMY     TONSILLECTOMY AND ADENOIDECTOMY     TOTAL KNEE ARTHROPLASTY  05/11/2011   Procedure: TOTAL KNEE ARTHROPLASTY;  Surgeon: Gearlean Alf;  Location: [facility];  Service: Orthopedics;  Laterality: Right;   US ECHOCARDIOGRAPHY  11/10/2006   EF 55-60%   US ECHOCARDIOGRAPHY  09/20/2002   EF 65-70%    FAMILY HISTORY Family History  Problem Relation Age of Onset   Hypertension Mother    Arthritis Mother    Diabetes Mother    Hypertension Father    Stroke Father    Kidney disease Father    Hypertension Brother    Diabetes Brother     SOCIAL HISTORY Social History   Tobacco Use   Smoking status: Never   Smokeless tobacco: Never  Vaping Use   Vaping Use: Never used  Substance Use Topics   Alcohol use: Yes    Alcohol/week: 7.0 standard drinks    Types: 7 Shots of liquor per week    Comment: 2 drinks most days   Drug use: No         OPHTHALMIC EXAM:  Base Eye Exam     Visual Acuity (ETDRS)       Right Left   Dist cc 20/80 -1 CF @Face    Dist ph cc 20/50 +1     Correction: Glasses         Tonometry (Tonopen, 11:02 AM)       Right Left   Pressure 16 16         Pupils       Pupils Dark Light Shape React APD   Right PERRL 4 3 Round Brisk None   Left PERRL 4 3 Round Brisk None         Visual Fields (Counting fingers)       Left Right    Full Full         Extraocular Movement       Right Left    Full Full         Neuro/Psych     Oriented x3: Yes   Mood/Affect: Normal         Dilation     Right eye: 1.0% Mydriacyl, 2.5% Phenylephrine @ 10:36 AM           Slit Lamp and Fundus Exam     External Exam       Right Left   External Normal Normal  Slit Lamp Exam       Right Left   Lids/Lashes Normal Normal   Conjunctiva/Sclera White and quiet White and quiet   Cornea Clear Clear   Anterior Chamber Deep and quiet Deep and quiet   Iris Round  and reactive Round and reactive   Lens Posterior chamber intraocular lens Posterior chamber intraocular lens   Anterior Vitreous Normal Normal         Fundus Exam       Right Left   Posterior Vitreous Normal Clear and vitrectomized   Disc Normal Normal   C/D Ratio 0.35 0.35   Macula Advanced age related macular degeneration, Geographic atrophy, Hard drusen, no exudates, no clinical Subretinal neovascular membrane, no hemorrhage, Retinal pigment epithelial mottling, Retinal pigment epithelial atrophy, small hemorrhage on nonfoveal side of atrophy Advanced age related macular degeneration, Pigmented atrophy, Retinal pigment epithelial atrophy - geographic   Vessels Normal Normal   Periphery Normal Normal            IMAGING AND PROCEDURES  Imaging and Procedures for 03/11/21  OCT, Retina - OU - Both Eyes       Right Eye Quality was good. Scan locations included subfoveal. Central Foveal Thickness: 233. Progression has improved. Findings include abnormal foveal contour, outer retinal atrophy, inner retinal atrophy.   Left Eye Quality was good. Scan locations included subfoveal. Central Foveal Thickness: 266. Progression has been stable. Findings include epiretinal membrane, outer retinal atrophy, inner retinal atrophy.   Notes OS with minor epiretinal membrane nasal aspect of fovea yet with atrophy no or intervention, with significant subfoveal geographic atrophy and scarring from prior CNVM And dry AMD a  OD, no sign of recurrence of CNVM 8-week interval today, stable overall no signs of intraretinal fluid or subretinal fluid     Intravitreal Injection, Pharmacologic Agent - OD - Right Eye       Time Out 03/11/2021. 11:03 AM. Confirmed correct patient, procedure, site, and patient consented.   Anesthesia Anesthetic medications included Proparacaine 0.5%, Akten 3.5%.   Procedure Preparation included 5% betadine to ocular surface, 10% betadine to eyelids, Tobramycin  0.3%. A 30 gauge needle was used.   Injection: 2.5 mg bevacizumab 2.5 MG/0.1ML   Route: Intravitreal, Site: Right Eye   NDC: [phone], Lot: [id number]   Post-op Post injection exam found visual acuity of at least counting fingers. The patient tolerated the procedure well. There were no complications. The patient received written and verbal post procedure care education. Post injection medications included ocuflox.              ASSESSMENT/PLAN:  Advanced nonexudative age-related macular degeneration of left eye with subfoveal involvement Accounts for acuity OS  Advanced nonexudative age-related macular degeneration of right eye without subfoveal involvement Near the FAZ not in the fast as of today  Exudative age-related macular degeneration of right eye with active choroidal neovascularization (HCC) Chronic active CNVM with recurrences  multiple in the past, will continue to treat today at 7-week interval.     ICD-10-CM   1. Exudative age-related macular degeneration of right eye with active choroidal neovascularization (HCC)  H35.3211 OCT, Retina - OU - Both Eyes    Intravitreal Injection, Pharmacologic Agent - OD - Right Eye    bevacizumab (AVASTIN) SOSY 2.5 mg    2. Advanced nonexudative age-related macular degeneration of left eye with subfoveal involvement  H35.3124     3. Advanced nonexudative age-related macular degeneration of right eye without subfoveal involvement  H35.3113  1.  OD looks great, status post injection CNVM treatment with intravitreal Avastin currently at 7-week interval.  History of multiple recurrences.  2.  Preserved acuity.  I did discuss with the patient his pinhole acuity of improvement 20/80  to 20/50 does suggest that if change in glasses may be possible  3.  Ophthalmic Meds Ordered this visit:  Meds ordered this encounter  Medications   bevacizumab (AVASTIN) SOSY 2.5 mg       Return in about 7 weeks (around 04/29/2021) for  dilate, OD, AVASTIN OCT.  There are no Patient Instructions on file for this visit.   Explained the diagnoses, plan, and follow up with the patient and they expressed understanding.  Patient expressed understanding of the importance of proper follow up care.   Stephen Wilkinson M.D. Diseases & Surgery of the Retina and Vitreous [facility] [place] 03/11/21     Abbreviations: M myopia (nearsighted); A astigmatism; H hyperopia (farsighted); P presbyopia; Mrx spectacle prescription;  CTL contact lenses; OD right eye; OS left eye; OU both eyes  XT exotropia; ET esotropia; PEK punctate epithelial keratitis; PEE punctate epithelial erosions; DES dry eye syndrome; MGD meibomian gland dysfunction; ATs artificial tears; PFAT's preservative free artificial tears; [place] nuclear sclerotic cataract; PSC posterior subcapsular cataract; ERM epi-retinal membrane; PVD posterior vitreous detachment; RD retinal detachment; DM diabetes mellitus; DR diabetic retinopathy; NPDR non-proliferative diabetic retinopathy; PDR proliferative diabetic retinopathy; CSME clinically significant macular edema; DME diabetic macular edema; dbh dot blot hemorrhages; CWS cotton wool spot; POAG primary open angle glaucoma; C/D cup-to-disc ratio; HVF humphrey visual field; GVF goldmann visual field; OCT optical coherence tomography; IOP intraocular pressure; BRVO Branch retinal vein occlusion; CRVO central retinal vein occlusion; CRAO central retinal artery occlusion; BRAO branch retinal artery occlusion; RT retinal tear; SB scleral buckle; PPV pars plana vitrectomy; VH Vitreous hemorrhage; PRP panretinal laser photocoagulation; IVK intravitreal kenalog; VMT vitreomacular traction; MH Macular hole;  NVD neovascularization of the disc; NVE neovascularization elsewhere; AREDS age related eye disease study; ARMD age related macular degeneration; POAG primary open angle glaucoma; EBMD epithelial/anterior basement membrane dystrophy; ACIOL  anterior chamber intraocular lens; IOL intraocular lens; PCIOL posterior chamber intraocular lens; Phaco/IOL phacoemulsification with intraocular lens placement; [place] photorefractive keratectomy; LASIK laser assisted in situ keratomileusis; HTN hypertension; DM diabetes mellitus; COPD chronic obstructive pulmonary disease

## 2021-03-11 NOTE — Assessment & Plan Note (Signed)
Accounts for acuity OS 

## 2021-03-11 NOTE — Assessment & Plan Note (Signed)
Chronic active CNVM with recurrences  multiple in the past, will continue to treat today at 7-week interval.

## 2021-03-11 NOTE — Assessment & Plan Note (Signed)
Near the [facility] not in the fast as of today

## 2021-03-12 ENCOUNTER — Encounter: Payer: Self-pay | Admitting: Internal Medicine

## 2021-03-12 DIAGNOSIS — E119 Type 2 diabetes mellitus without complications: Secondary | ICD-10-CM | POA: Diagnosis not present

## 2021-03-12 DIAGNOSIS — Z961 Presence of intraocular lens: Secondary | ICD-10-CM | POA: Diagnosis not present

## 2021-03-12 DIAGNOSIS — H52203 Unspecified astigmatism, bilateral: Secondary | ICD-10-CM | POA: Diagnosis not present

## 2021-03-12 LAB — HM DIABETES EYE EXAM

## 2021-03-18 ENCOUNTER — Encounter (INDEPENDENT_AMBULATORY_CARE_PROVIDER_SITE_OTHER): Payer: Medicare Other | Admitting: Ophthalmology

## 2021-03-19 ENCOUNTER — Encounter (INDEPENDENT_AMBULATORY_CARE_PROVIDER_SITE_OTHER): Payer: Medicare Other | Admitting: Ophthalmology

## 2021-04-17 ENCOUNTER — Ambulatory Visit (INDEPENDENT_AMBULATORY_CARE_PROVIDER_SITE_OTHER): Payer: Medicare Other | Admitting: Podiatry

## 2021-04-17 ENCOUNTER — Other Ambulatory Visit: Payer: Self-pay

## 2021-04-17 ENCOUNTER — Ambulatory Visit (INDEPENDENT_AMBULATORY_CARE_PROVIDER_SITE_OTHER): Payer: Medicare Other

## 2021-04-17 DIAGNOSIS — Z7901 Long term (current) use of anticoagulants: Secondary | ICD-10-CM

## 2021-04-17 DIAGNOSIS — M7731 Calcaneal spur, right foot: Secondary | ICD-10-CM | POA: Diagnosis not present

## 2021-04-17 DIAGNOSIS — M79676 Pain in unspecified toe(s): Secondary | ICD-10-CM

## 2021-04-17 DIAGNOSIS — B351 Tinea unguium: Secondary | ICD-10-CM | POA: Diagnosis not present

## 2021-04-17 DIAGNOSIS — M722 Plantar fascial fibromatosis: Secondary | ICD-10-CM | POA: Diagnosis not present

## 2021-04-17 DIAGNOSIS — L84 Corns and callosities: Secondary | ICD-10-CM

## 2021-04-17 DIAGNOSIS — E1140 Type 2 diabetes mellitus with diabetic neuropathy, unspecified: Secondary | ICD-10-CM

## 2021-04-17 DIAGNOSIS — M79671 Pain in right foot: Secondary | ICD-10-CM

## 2021-04-17 MED ORDER — TRIAMCINOLONE ACETONIDE 10 MG/ML IJ SUSP
10.0000 mg | Freq: Once | INTRAMUSCULAR | Status: AC
  Administered 2021-04-17: 10 mg

## 2021-04-17 NOTE — Patient Instructions (Signed)

## 2021-04-20 NOTE — Progress Notes (Signed)
Subjective: 87 y.o. returns the office today for painful, elongated, thickened toenails which he cannot trim himself.  Also has a painful skin lesion on the bottom of his right heel which is causing discomfort.  Also he feels a [place] fasciitis is coming back and he is asking for steroid injection right foot.  Denies any recent injury or trauma.  No swelling.  He has no other concerns today.   PCP: Biagio Borg, MD- last seen 10/09/2020 A1c: 6.5 on 10/09/2020  Objective: NAD DP/PT pulses palpable, CRT less than 3 seconds Nails hypertrophic, dystrophic, elongated, brittle, discolored 10. There is tenderness overlying the nails 1-5 bilaterally. There is no surrounding erythema or drainage along the nail sites. Hyperkeratotic lesion plantar right heel without any underlying ulceration drainage or signs of infection. Also tenderness palpation on plantar medial tubercle of the calcaneus at the insertion of plantar fashion the right foot.  There is no pain with lateral compression of calcaneus.  No edema, erythema.  No pain with Achilles tendon. No open lesions or pre-ulcerative lesions are identified. No pain with calf compression, swelling, warmth, erythema.  Assessment: Patient presents with symptomatic onychomycosis, hyperkeratotic lesion; on Plavix; right plantar fasciitis  Plan: -Treatment options including alternatives, risks, complications were discussed -X-rays obtained and reviewed of the right foot.  No subacute fracture.  Vessel calcifications are noted.  Minimal inferior calcaneal spur present. -I discussed risks of steroid injection he understands this but he wants to proceed with it.  See procedure note below.  Continue stretching, icing daily as well to help with the midpoint of fasciitis. -Nails sharply debrided 10 without complication/bleeding. -Hyperkeratotic lesion sharply debrided x1 without any complications or bleeding.  Moisturizer and offloading. -Discussed daily foot  inspection. If there are any changes, to call the office immediately.  -Follow-up in 3 months or sooner if any problems are to arise. In the meantime, encouraged to call the office with any questions, concerns, changes symptoms.  Procedure: Injection Tendon/Ligament Discussed alternatives, risks, complications and verbal consent was obtained.  Location: Right plantar fascia at the glabrous junction; medial approach. Skin Prep: Alcohol. Injectate: 0.5cc 0.5% marcaine plain, 0.5 cc 2% lidocaine plain and, 1 cc kenalog 10. Disposition: Patient tolerated procedure well. Injection site dressed with a band-aid.  Post-injection care was discussed and return precautions discussed.   Celesta Gentile, DPM

## 2021-04-28 ENCOUNTER — Other Ambulatory Visit: Payer: Self-pay | Admitting: Podiatry

## 2021-04-28 ENCOUNTER — Other Ambulatory Visit: Payer: Self-pay | Admitting: Internal Medicine

## 2021-04-28 DIAGNOSIS — M7731 Calcaneal spur, right foot: Secondary | ICD-10-CM

## 2021-04-28 NOTE — Telephone Encounter (Signed)
Please refill as per office routine med refill policy (all routine meds to be refilled for 3 mo or monthly (per pt preference) up to one year from last visit, then month to month grace period for 3 mo, then further med refills will have to be denied) ? ?

## 2021-04-29 ENCOUNTER — Encounter (INDEPENDENT_AMBULATORY_CARE_PROVIDER_SITE_OTHER): Payer: Medicare Other | Admitting: Ophthalmology

## 2021-04-30 ENCOUNTER — Ambulatory Visit (INDEPENDENT_AMBULATORY_CARE_PROVIDER_SITE_OTHER): Payer: Medicare Other | Admitting: Ophthalmology

## 2021-04-30 ENCOUNTER — Other Ambulatory Visit: Payer: Self-pay

## 2021-04-30 ENCOUNTER — Encounter (INDEPENDENT_AMBULATORY_CARE_PROVIDER_SITE_OTHER): Payer: Self-pay | Admitting: Ophthalmology

## 2021-04-30 DIAGNOSIS — H353211 Exudative age-related macular degeneration, right eye, with active choroidal neovascularization: Secondary | ICD-10-CM

## 2021-04-30 DIAGNOSIS — H35372 Puckering of macula, left eye: Secondary | ICD-10-CM | POA: Diagnosis not present

## 2021-04-30 DIAGNOSIS — H353124 Nonexudative age-related macular degeneration, left eye, advanced atrophic with subfoveal involvement: Secondary | ICD-10-CM | POA: Diagnosis not present

## 2021-04-30 MED ORDER — BEVACIZUMAB 2.5 MG/0.1ML IZ SOSY
2.5000 mg | PREFILLED_SYRINGE | INTRAVITREAL | Status: AC | PRN
  Administered 2021-04-30: 2.5 mg via INTRAVITREAL

## 2021-04-30 NOTE — Assessment & Plan Note (Signed)
Minor nasal to FAZ, atrophy centrally limits acuity no impact on acuity now

## 2021-04-30 NOTE — Assessment & Plan Note (Signed)
Will repeat treatment today at 7-week interval for chronic reactive and multiple recurrences of CNVM.  Region of atrophy adjacent to the [place] not active yet on the temporal aspect of this region of atrophy of small hemorrhage remains seen last visit, not enlarging, will control with intravitreal Avastin  OD today

## 2021-04-30 NOTE — Assessment & Plan Note (Signed)
Accounts for acuity 

## 2021-04-30 NOTE — Progress Notes (Signed)
04/30/2021     CHIEF COMPLAINT Patient presents for  Chief Complaint  Patient presents with   Retina Follow Up      HISTORY OF PRESENT ILLNESS: Stephen Wilkinson is a 87 y.o. male who presents to the clinic today for:   HPI     Retina Follow Up   Patient presents with  Wet AMD.  In right eye.  This started 7 weeks ago.  Duration of 7 weeks.  Since onset it is stable.        Comments   7 week f/u OD with OCT and possible Avastin injection  EyeMeds: Latanoprost QHS OU      Last edited by Reather Littler, COA on 04/30/2021 10:16 AM.      Referring physician: Biagio Borg, MD [place],  [place] [id number]  HISTORICAL INFORMATION:   Selected notes from the MEDICAL RECORD NUMBER    Lab Results  Component Value Date   HGBA1C 6.5 10/09/2020     CURRENT MEDICATIONS: Current Outpatient Medications (Ophthalmic Drugs)  Medication Sig   latanoprost (XALATAN) 0.005 % ophthalmic solution Place 1 drop into both eyes at bedtime.   No current facility-administered medications for this visit. (Ophthalmic Drugs)   Current Outpatient Medications (Other)  Medication Sig   metFORMIN (GLUCOPHAGE) 1000 MG tablet TAKE 1 TABLET EVERY DAY   amLODipine (NORVASC) 10 MG tablet TAKE 1 TABLET EVERY MORNING   aspirin 81 MG tablet Take 81 mg by mouth daily.   Bevacizumab (AVASTIN IV) Place 1 drop into the right eye as directed. Every 6-8 weeks   clobetasol cream (TEMOVATE) 1.19 % Apply 1 application topically daily as needed (for contact dermatitis). Contact dermatitis   clopidogrel (PLAVIX) 75 MG tablet TAKE 1 TABLET EVERY DAY   famotidine (PEPCID) 20 MG tablet Take 1 tablet (20 mg total) by mouth 2 (two) times daily.   glucose blood (ONE TOUCH ULTRA TEST) test strip Use as directed once daily to check blood sugar.  Diagnosis code E11.42   levothyroxine (SYNTHROID) 125 MCG tablet TAKE 1 TABLET EVERY DAY BEFORE BREAKFAST   metoprolol succinate (TOPROL-XL) 25 MG 24 hr tablet  TAKE 1 TABLET EVERY MORNING   Multiple Vitamins-Minerals (ICAPS AREDS 2 PO) Take 2 capsules by mouth daily.   tiZANidine (ZANAFLEX) 4 MG tablet TAKE 1 TABLET (4 MG TOTAL) BY MOUTH EVERY 6 (SIX) HOURS AS NEEDED FOR MUSCLE SPASMS.   No current facility-administered medications for this visit. (Other)      REVIEW OF SYSTEMS:    ALLERGIES No Known Allergies  PAST MEDICAL HISTORY Past Medical History:  Diagnosis Date   Acute blood loss anemia 05/15/2011   post surgical   Altered mental status 05/15/2011   Anxiety    B12 deficiency 09/23/2016   Blood transfusion without reported diagnosis    Cancer ([place])    basal and squamous skin carcinoma    Cataract    Chronic kidney disease    hx of uti due to catheter hx of prostatitis, BPH -    Depression    Diabetes mellitus    GERD (gastroesophageal reflux disease)    Glaucoma 01/04/2013   Heart murmur    History of nuclear stress test    Myoview 3/17: EF 62%, no ischemia or scar, low risk   Hx of transient ischemic attack (TIA) 01/04/2013   Hx: UTI (urinary tract infection)    Hyperlipidemia    Hypertension    Hypokalemia 05/15/2011   Hypothyroidism  Insomnia    Macular degeneration    Neuropathy    OA (osteoarthritis)    Pneumonia    PVC's (premature ventricular contractions)    Renal mass    Stroke ([place])    mid 1990s    Past Surgical History:  Procedure Laterality Date   APPENDECTOMY     CATARACT EXTRACTION W/ INTRAOCULAR LENS  IMPLANT, BILATERAL     INGUINAL HERNIA REPAIR     X3   LUMBAR LAMINECTOMY/DECOMPRESSION MICRODISCECTOMY N/A 02/12/2017   Procedure: Lumbar Three-Four, Lumbar Four-Five, Lumbar Five-Sacral One Laminectomy and Foraminotomy;  Surgeon: Earnie Larsson, MD;  Location: [facility];  Service: Neurosurgery;  Laterality: N/A;   OTHER SURGICAL HISTORY     microwave procedure for prostate - 09/12    PROSTATE ABLATION     microwave   REPLACEMENT TOTAL KNEE     THYROIDECTOMY     TONSILLECTOMY AND ADENOIDECTOMY      TOTAL KNEE ARTHROPLASTY  05/11/2011   Procedure: TOTAL KNEE ARTHROPLASTY;  Surgeon: Gearlean Alf;  Location: [facility];  Service: Orthopedics;  Laterality: Right;   US ECHOCARDIOGRAPHY  11/10/2006   EF 55-60%   US ECHOCARDIOGRAPHY  09/20/2002   EF 65-70%    FAMILY HISTORY Family History  Problem Relation Age of Onset   Hypertension Mother    Arthritis Mother    Diabetes Mother    Hypertension Father    Stroke Father    Kidney disease Father    Hypertension Brother    Diabetes Brother     SOCIAL HISTORY Social History   Tobacco Use   Smoking status: Never   Smokeless tobacco: Never  Vaping Use   Vaping Use: Never used  Substance Use Topics   Alcohol use: Yes    Alcohol/week: 7.0 standard drinks    Types: 7 Shots of liquor per week    Comment: 2 drinks most days   Drug use: No         OPHTHALMIC EXAM:  Base Eye Exam     Visual Acuity (ETDRS)       Right Left   Dist cc 20/50 -2 CF@face    Dist ph cc NI NI    Correction: Glasses         Tonometry (Tonopen, 10:21 AM)       Right Left   Pressure 16 14         Pupils       Pupils Dark Light Shape React APD   Right PERRL 4 3 Round Brisk None   Left PERRL 4 3 Round Brisk None         Visual Fields (Counting fingers)       Left Right    Full Full         Extraocular Movement       Right Left    Full, Ortho Full, Ortho         Neuro/Psych     Oriented x3: Yes   Mood/Affect: Normal         Dilation     Right eye: 1.0% Mydriacyl, 2.5% Phenylephrine @ 10:21 AM           Slit Lamp and Fundus Exam     External Exam       Right Left   External Normal Normal         Slit Lamp Exam       Right Left   Lids/Lashes Normal Normal   Conjunctiva/Sclera White and quiet White and quiet  Cornea Clear Clear   Anterior Chamber Deep and quiet Deep and quiet   Iris Round and reactive Round and reactive   Lens Posterior chamber intraocular lens Posterior chamber intraocular  lens   Anterior Vitreous Normal Normal         Fundus Exam       Right Left   Posterior Vitreous Normal    Disc Normal    C/D Ratio 0.35 0.35   Macula Advanced age related macular degeneration, Geographic atrophy, Hard drusen, no exudates, no clinical Subretinal neovascular membrane, no hemorrhage, Retinal pigment epithelial mottling, Retinal pigment epithelial atrophy, small hemorrhage on nonfoveal side of atrophy, temporally but not larger    Vessels Normal    Periphery Normal             IMAGING AND PROCEDURES  Imaging and Procedures for 04/30/21  OCT, Retina - OU - Both Eyes       Right Eye Quality was good. Scan locations included subfoveal. Central Foveal Thickness: 22. Progression has improved. Findings include abnormal foveal contour, outer retinal atrophy, inner retinal atrophy.   Left Eye Quality was good. Scan locations included subfoveal. Central Foveal Thickness: 266. Progression has been stable. Findings include epiretinal membrane, outer retinal atrophy, inner retinal atrophy.   Notes OS with minor epiretinal membrane nasal aspect of fovea yet with atrophy no or intervention, with significant subfoveal geographic atrophy and scarring from prior CNVM And dry AMD a  OD, no sign of recurrence of CNVM 7-week interval today, stable overall no signs of intraretinal fluid or subretinal fluid     Intravitreal Injection, Pharmacologic Agent - OD - Right Eye       Time Out 04/30/2021. 11:04 AM. Confirmed correct patient, procedure, site, and patient consented.   Anesthesia Topical anesthesia was used. Anesthetic medications included Lidocaine 4%.   Procedure Preparation included 5% betadine to ocular surface, 10% betadine to eyelids, Tobramycin 0.3%. A 30 gauge needle was used.   Injection: 2.5 mg bevacizumab 2.5 MG/0.1ML   Route: Intravitreal, Site: Right Eye   NDC: [phone], Lot: [id number]   Post-op Post injection exam found visual acuity of at  least counting fingers. The patient tolerated the procedure well. There were no complications. The patient received written and verbal post procedure care education. Post injection medications included ocuflox.              ASSESSMENT/PLAN:  Exudative age-related macular degeneration of right eye with active choroidal neovascularization ([place]) Will repeat treatment today at 7-week interval for chronic reactive and multiple recurrences of CNVM.  Region of atrophy adjacent to the [place] not active yet on the temporal aspect of this region of atrophy of small hemorrhage remains seen last visit, not enlarging, will control with intravitreal Avastin  OD today  Advanced nonexudative age-related macular degeneration of left eye with subfoveal involvement Accounts for acuity  Epiretinal membrane, left eye Minor nasal to FAZ, atrophy centrally limits acuity no impact on acuity now     ICD-10-CM   1. Exudative age-related macular degeneration of right eye with active choroidal neovascularization (HCC)  H35.3211 OCT, Retina - OU - Both Eyes    Intravitreal Injection, Pharmacologic Agent - OD - Right Eye    bevacizumab (AVASTIN) SOSY 2.5 mg    2. Advanced nonexudative age-related macular degeneration of left eye with subfoveal involvement  H35.3124     3. Epiretinal membrane, left eye  H35.372       1.  OD with chronic multiple recurrences of CNVM.  Stable in the past 7 to 8-week interval.  Today repeat injection Avastin today at 7 weeks.  Continue to monitor region of small hemorrhage not in the FAZ not threatening to [place] but in the clinicians macula temporal edge of previous atrophy.  2.  OS vision limited by subfoveal atrophy nonetheless anatomically vastly improved once VMT released some years previous  3.  Ophthalmic Meds Ordered this visit:  Meds ordered this encounter  Medications   bevacizumab (AVASTIN) SOSY 2.5 mg       Return in about 7 weeks (around 06/18/2021) for dilate, OD,  AVASTIN OCT.  There are no Patient Instructions on file for this visit.   Explained the diagnoses, plan, and follow up with the patient and they expressed understanding.  Patient expressed understanding of the importance of proper follow up care.   Clent Demark Melroy Bougher M.D. Diseases & Surgery of the Retina and Vitreous [facility] [place] 04/30/21     Abbreviations: M myopia (nearsighted); A astigmatism; H hyperopia (farsighted); P presbyopia; Mrx spectacle prescription;  CTL contact lenses; OD right eye; OS left eye; OU both eyes  XT exotropia; ET esotropia; PEK punctate epithelial keratitis; PEE punctate epithelial erosions; DES dry eye syndrome; MGD meibomian gland dysfunction; ATs artificial tears; PFAT's preservative free artificial tears; [place] nuclear sclerotic cataract; PSC posterior subcapsular cataract; ERM epi-retinal membrane; PVD posterior vitreous detachment; RD retinal detachment; DM diabetes mellitus; DR diabetic retinopathy; NPDR non-proliferative diabetic retinopathy; PDR proliferative diabetic retinopathy; CSME clinically significant macular edema; DME diabetic macular edema; dbh dot blot hemorrhages; CWS cotton wool spot; POAG primary open angle glaucoma; C/D cup-to-disc ratio; HVF humphrey visual field; GVF goldmann visual field; OCT optical coherence tomography; IOP intraocular pressure; BRVO Branch retinal vein occlusion; CRVO central retinal vein occlusion; CRAO central retinal artery occlusion; BRAO branch retinal artery occlusion; RT retinal tear; SB scleral buckle; PPV pars plana vitrectomy; VH Vitreous hemorrhage; PRP panretinal laser photocoagulation; IVK intravitreal kenalog; VMT vitreomacular traction; MH Macular hole;  NVD neovascularization of the disc; NVE neovascularization elsewhere; AREDS age related eye disease study; ARMD age related macular degeneration; POAG primary open angle glaucoma; EBMD epithelial/anterior basement membrane dystrophy; ACIOL anterior  chamber intraocular lens; IOL intraocular lens; PCIOL posterior chamber intraocular lens; Phaco/IOL phacoemulsification with intraocular lens placement; [place] photorefractive keratectomy; LASIK laser assisted in situ keratomileusis; HTN hypertension; DM diabetes mellitus; COPD chronic obstructive pulmonary disease

## 2021-05-20 ENCOUNTER — Encounter: Payer: Self-pay | Admitting: Internal Medicine

## 2021-05-20 ENCOUNTER — Ambulatory Visit (INDEPENDENT_AMBULATORY_CARE_PROVIDER_SITE_OTHER): Payer: Medicare Other | Admitting: Internal Medicine

## 2021-05-20 ENCOUNTER — Ambulatory Visit: Payer: Medicare Other

## 2021-05-20 ENCOUNTER — Other Ambulatory Visit: Payer: Self-pay

## 2021-05-20 VITALS — BP 100/60 | HR 78 | Temp 98.4°F | Ht 73.0 in | Wt 176.0 lb

## 2021-05-20 DIAGNOSIS — E1142 Type 2 diabetes mellitus with diabetic polyneuropathy: Secondary | ICD-10-CM

## 2021-05-20 DIAGNOSIS — W19XXXA Unspecified fall, initial encounter: Secondary | ICD-10-CM | POA: Diagnosis not present

## 2021-05-20 DIAGNOSIS — E034 Atrophy of thyroid (acquired): Secondary | ICD-10-CM

## 2021-05-20 DIAGNOSIS — I959 Hypotension, unspecified: Secondary | ICD-10-CM | POA: Diagnosis not present

## 2021-05-20 DIAGNOSIS — R531 Weakness: Secondary | ICD-10-CM

## 2021-05-20 NOTE — Progress Notes (Signed)
Patient ID: Stephen Wilkinson, male   DOB: 1929/06/13, 87 y.o.   MRN: [id number]        Chief Complaint: follow up HTN, fall and fatigue       HPI:  Stephen Wilkinson is a 87 y.o. male here with c/o 2 days onset worsening more than baseline generalized weakness, lack of energy, fall x 1 without injury.  Pt denies chest pain, increased sob or doe, wheezing, orthopnea, PND, increased LE swelling, palpitations, dizziness or syncope   Pt denies polydipsia, polyuria, or new focal neuro s/s.   Pt denies fever, wt loss, night sweats, loss of appetite, or other constitutional symptoms   Asks for lab evalaution   Denies hyper or hypo thyroid symptoms such as voice, skin or hair change.       Wt Readings from Last 3 Encounters:  05/20/21 176 lb (79.8 kg)  11/28/20 176 lb 6.4 oz (80 kg)  10/09/20 177 lb (80.3 kg)   BP Readings from Last 3 Encounters:  05/20/21 100/60  11/28/20 130/74  10/09/20 114/78         Past Medical History:  Diagnosis Date   Acute blood loss anemia 05/15/2011   post surgical   Altered mental status 05/15/2011   Anxiety    B12 deficiency 09/23/2016   Blood transfusion without reported diagnosis    Cancer ([place])    basal and squamous skin carcinoma    Cataract    Chronic kidney disease    hx of uti due to catheter hx of prostatitis, BPH -    Depression    Diabetes mellitus    GERD (gastroesophageal reflux disease)    Glaucoma 01/04/2013   Heart murmur    History of nuclear stress test    Myoview 3/17: EF 62%, no ischemia or scar, low risk   Hx of transient ischemic attack (TIA) 01/04/2013   Hx: UTI (urinary tract infection)    Hyperlipidemia    Hypertension    Hypokalemia 05/15/2011   Hypothyroidism    Insomnia    Macular degeneration    Neuropathy    OA (osteoarthritis)    Pneumonia    PVC's (premature ventricular contractions)    Renal mass    Stroke ([facility])    mid 1990s    Past Surgical History:  Procedure Laterality Date   APPENDECTOMY     CATARACT EXTRACTION W/  INTRAOCULAR LENS  IMPLANT, BILATERAL     INGUINAL HERNIA REPAIR     X3   LUMBAR LAMINECTOMY/DECOMPRESSION MICRODISCECTOMY N/A 02/12/2017   Procedure: Lumbar Three-Four, Lumbar Four-Five, Lumbar Five-Sacral One Laminectomy and Foraminotomy;  Surgeon: Earnie Larsson, MD;  Location: [facility];  Service: Neurosurgery;  Laterality: N/A;   OTHER SURGICAL HISTORY     microwave procedure for prostate - 09/12    PROSTATE ABLATION     microwave   REPLACEMENT TOTAL KNEE     THYROIDECTOMY     TONSILLECTOMY AND ADENOIDECTOMY     TOTAL KNEE ARTHROPLASTY  05/11/2011   Procedure: TOTAL KNEE ARTHROPLASTY;  Surgeon: Gearlean Alf;  Location: [facility];  Service: Orthopedics;  Laterality: Right;   US ECHOCARDIOGRAPHY  11/10/2006   EF 55-60%   US ECHOCARDIOGRAPHY  09/20/2002   EF 65-70%    reports that he has never smoked. He has never used smokeless tobacco. He reports current alcohol use of about 7.0 standard drinks per week. He reports that he does not use drugs. family history includes Arthritis in his mother; Diabetes in his brother and mother;  Hypertension in his brother, father, and mother; Kidney disease in his father; Stroke in his father. No Known Allergies Current Outpatient Medications on File Prior to Visit  Medication Sig Dispense Refill   aspirin 81 MG tablet Take 81 mg by mouth daily.     Bevacizumab (AVASTIN IV) Place 1 drop into the right eye as directed. Every 6-8 weeks     clobetasol cream (TEMOVATE) 8.31 % Apply 1 application topically daily as needed (for contact dermatitis). Contact dermatitis  3   clopidogrel (PLAVIX) 75 MG tablet TAKE 1 TABLET EVERY DAY 90 tablet 3   famotidine (PEPCID) 20 MG tablet Take 1 tablet (20 mg total) by mouth 2 (two) times daily. 180 tablet 3   glucose blood (ONE TOUCH ULTRA TEST) test strip Use as directed once daily to check blood sugar.  Diagnosis code E11.42 100 each 11   latanoprost (XALATAN) 0.005 % ophthalmic solution Place 1 drop into both eyes at bedtime.  2.5 mL 12   levothyroxine (SYNTHROID) 125 MCG tablet TAKE 1 TABLET EVERY DAY BEFORE BREAKFAST 90 tablet 3   metFORMIN (GLUCOPHAGE) 1000 MG tablet TAKE 1 TABLET EVERY DAY 90 tablet 0   metoprolol succinate (TOPROL-XL) 25 MG 24 hr tablet TAKE 1 TABLET EVERY MORNING 90 tablet 2   Multiple Vitamins-Minerals (ICAPS AREDS 2 PO) Take 2 capsules by mouth daily.     tiZANidine (ZANAFLEX) 4 MG tablet TAKE 1 TABLET (4 MG TOTAL) BY MOUTH EVERY 6 (SIX) HOURS AS NEEDED FOR MUSCLE SPASMS. 360 tablet 3   No current facility-administered medications on file prior to visit.        ROS:  All others reviewed and negative.  Objective        PE:  BP 100/60 (BP Location: Left Arm, Patient Position: Sitting, Cuff Size: Large)   Pulse 78   Temp 98.4 F (36.9 C) (Oral)   Ht 6\' 1"  (1.854 m)   Wt 176 lb (79.8 kg)   SpO2 97%   BMI 23.22 kg/m                 Constitutional: Pt appears in NAD               HENT: Head: NCAT.                Right Ear: External ear normal.                 Left Ear: External ear normal.                Eyes: . Pupils are equal, round, and reactive to light. Conjunctivae and EOM are normal               Nose: without d/c or deformity               Neck: Neck supple. Gross normal ROM               Cardiovascular: Normal rate and regular rhythm.                 Pulmonary/Chest: Effort normal and breath sounds without rales or wheezing.                Abd:  Soft, NT, ND, + BS, no organomegaly               Neurological: Pt is alert. At baseline orientation, motor grossly intact  Skin: Skin is warm. No rashes, no other new lesions, LE edema - none               Psychiatric: Pt behavior is normal without agitation   Micro: none  Cardiac tracings I have personally interpreted today:  none  Pertinent Radiological findings (summarize): none   Lab Results  Component Value Date   WBC 8.1 05/21/2021   HGB 16.2 05/21/2021   HCT 47.0 05/21/2021   PLT 237.0 05/21/2021    GLUCOSE 185 (H) 05/21/2021   CHOL 182 05/21/2021   TRIG 76.0 05/21/2021   HDL 70.00 05/21/2021   LDLCALC 97 05/21/2021   ALT 12 05/21/2021   AST 25 05/21/2021   NA 134 (L) 05/21/2021   K 4.7 05/21/2021   CL 97 05/21/2021   CREATININE 1.58 (H) 05/21/2021   BUN 27 (H) 05/21/2021   CO2 24 05/21/2021   TSH 4.33 05/21/2021   INR 2.16 (H) 05/15/2011   HGBA1C 7.0 (H) 05/21/2021   MICROALBUR 0.8 10/09/2020   Assessment/Plan:  Stephen Wilkinson is a 82 y.o. White or Caucasian [1] male with  has a past medical history of Acute blood loss anemia (05/15/2011), Altered mental status (05/15/2011), Anxiety, B12 deficiency (09/23/2016), Blood transfusion without reported diagnosis, Cancer ([place]), Cataract, Chronic kidney disease, Depression, Diabetes mellitus, GERD (gastroesophageal reflux disease), Glaucoma (01/04/2013), Heart murmur, History of nuclear stress test, transient ischemic attack (TIA) (01/04/2013), UTI (urinary tract infection), Hyperlipidemia, Hypertension, Hypokalemia (05/15/2011), Hypothyroidism, Insomnia, Macular degeneration, Neuropathy, OA (osteoarthritis), Pneumonia, PVC's (premature ventricular contractions), Renal mass, and Stroke ([place]).  Hypotension BP low normal today - to d/c amlodipine 10 and f/u bp as he does regularly at home  Hypothyroidism With fatigue - also for TFTs with labs  DM type 2 with diabetic peripheral neuropathy Lab Results  Component Value Date   HGBA1C 7.0 (H) 05/21/2021   Stable, pt to continue current medical treatment metformin   General weakness Exam o/w benign - for labs as ordered including cbc  [facility] with cane, has hx of falls, pt is not concerned about this one in particular, no injury, cont cane use though may need walker , declines PT for now  Followup: Return in about 6 months (around 11/18/2021).  Cathlean Cower, MD 05/25/2021 3:05 PM [facility]

## 2021-05-20 NOTE — Patient Instructions (Addendum)
Ok to STOP the amlodipine 10 mg for now  Please continue all other medications as before, and refills have been done if requested.  Please have the pharmacy call with any other refills you may need.  Please continue your efforts at being more active, low cholesterol diet, and weight control.  Please keep your appointments with your specialists as you may have planned - Dr Stanford Breed in about 3 wks  Please go to the XRAY Department in the first floor for the x-ray testing  Please go to the LAB at the blood drawing area for the tests to be done  You will be contacted by phone if any changes need to be made immediately.  Otherwise, you will receive a letter about your results with an explanation, but please check with MyChart first.  Please remember to sign up for MyChart if you have not done so, as this will be important to you in the future with finding out test results, communicating by private email, and scheduling acute appointments online when needed.  Please make an Appointment to return in 6 months, or sooner if needed

## 2021-05-21 ENCOUNTER — Other Ambulatory Visit (INDEPENDENT_AMBULATORY_CARE_PROVIDER_SITE_OTHER): Payer: Medicare Other

## 2021-05-21 DIAGNOSIS — E1142 Type 2 diabetes mellitus with diabetic polyneuropathy: Secondary | ICD-10-CM | POA: Diagnosis not present

## 2021-05-21 DIAGNOSIS — E034 Atrophy of thyroid (acquired): Secondary | ICD-10-CM

## 2021-05-21 LAB — CBC WITH DIFFERENTIAL/PLATELET
Basophils Absolute: 0 10*3/uL (ref 0.0–0.1)
Basophils Relative: 0.3 % (ref 0.0–3.0)
Eosinophils Absolute: 0 10*3/uL (ref 0.0–0.7)
Eosinophils Relative: 0.1 % (ref 0.0–5.0)
HCT: 47 % (ref 39.0–52.0)
Hemoglobin: 16.2 g/dL (ref 13.0–17.0)
Lymphocytes Relative: 11.6 % — ABNORMAL LOW (ref 12.0–46.0)
Lymphs Abs: 0.9 10*3/uL (ref 0.7–4.0)
MCHC: 34.5 g/dL (ref 30.0–36.0)
MCV: 95.8 fl (ref 78.0–100.0)
Monocytes Absolute: 1 10*3/uL (ref 0.1–1.0)
Monocytes Relative: 13 % — ABNORMAL HIGH (ref 3.0–12.0)
Neutro Abs: 6 10*3/uL (ref 1.4–7.7)
Neutrophils Relative %: 75 % (ref 43.0–77.0)
Platelets: 237 10*3/uL (ref 150.0–400.0)
RBC: 4.9 Mil/uL (ref 4.22–5.81)
RDW: 13.4 % (ref 11.5–15.5)
WBC: 8.1 10*3/uL (ref 4.0–10.5)

## 2021-05-21 LAB — TSH: TSH: 4.33 u[IU]/mL (ref 0.35–5.50)

## 2021-05-21 LAB — BASIC METABOLIC PANEL
BUN: 27 mg/dL — ABNORMAL HIGH (ref 6–23)
CO2: 24 mEq/L (ref 19–32)
Calcium: 9.4 mg/dL (ref 8.4–10.5)
Chloride: 97 mEq/L (ref 96–112)
Creatinine, Ser: 1.58 mg/dL — ABNORMAL HIGH (ref 0.40–1.50)
GFR: 38.19 mL/min — ABNORMAL LOW (ref >60.00)
Glucose, Bld: 185 mg/dL — ABNORMAL HIGH (ref 70–99)
Potassium: 4.7 mEq/L (ref 3.5–5.1)
Sodium: 134 mEq/L — ABNORMAL LOW (ref 135–145)

## 2021-05-21 LAB — LIPID PANEL
Cholesterol: 182 mg/dL (ref 0–200)
HDL: 70 mg/dL (ref >39.00)
LDL Cholesterol: 97 mg/dL (ref 0–99)
NonHDL: 112.32
Total CHOL/HDL Ratio: 3
Triglycerides: 76 mg/dL (ref 0.0–149.0)
VLDL: 15.2 mg/dL (ref 0.0–40.0)

## 2021-05-21 LAB — HEPATIC FUNCTION PANEL
ALT: 12 U/L (ref 0–53)
AST: 25 U/L (ref 0–37)
Albumin: 4.3 g/dL (ref 3.5–5.2)
Alkaline Phosphatase: 75 U/L (ref 39–117)
Bilirubin, Direct: 0.2 mg/dL (ref 0.0–0.3)
Total Bilirubin: 0.9 mg/dL (ref 0.2–1.2)
Total Protein: 7.2 g/dL (ref 6.0–8.3)

## 2021-05-21 LAB — HEMOGLOBIN A1C: Hgb A1c MFr Bld: 7 % — ABNORMAL HIGH (ref 4.6–6.5)

## 2021-05-23 ENCOUNTER — Encounter: Payer: Self-pay | Admitting: Internal Medicine

## 2021-05-25 ENCOUNTER — Encounter: Payer: Self-pay | Admitting: Internal Medicine

## 2021-05-25 NOTE — Assessment & Plan Note (Addendum)
BP low normal today - to d/c amlodipine 10 and f/u bp as he does regularly at home, has f/u with cardiology in 3 wks

## 2021-05-25 NOTE — Assessment & Plan Note (Signed)
With fatigue - also for TFTs with labs

## 2021-05-25 NOTE — Assessment & Plan Note (Addendum)
Walks with cane, has hx of falls, pt is not concerned about this one in particular, no injury, cont cane use though may need walker , declines PT for now

## 2021-05-25 NOTE — Assessment & Plan Note (Signed)
Lab Results  Component Value Date   HGBA1C 7.0 (H) 05/21/2021   Stable, pt to continue current medical treatment metformin

## 2021-05-25 NOTE — Assessment & Plan Note (Signed)
Exam o/w benign - for labs as ordered including cbc

## 2021-05-26 NOTE — Progress Notes (Signed)
YQM:VHQION-GE hypertension and palpitations. Also history of TIA. Nuclear study March 2017 showed ejection fraction 62% and no ischemia.  Carotid Dopplers May 2020 showed 1 to 39% bilateral stenosis. Question episodes of syncope at previous ov. Echo 4/21 showed normal LV function; grade 2 DD; trace AI and mild AS with mean gradient 12 mmHg. Monitor 4/21 showed sinus with PACs, PVCs brief PAT but no pauses. Since last seen, over the past week he notes increased fatigue.  He denies dyspnea, chest pain, palpitations or syncope.  No bleeding.  Current Outpatient Medications  Medication Sig Dispense Refill   aspirin 81 MG tablet Take 81 mg by mouth daily.     Bevacizumab (AVASTIN IV) Place 1 drop into the right eye as directed. Every 6-8 weeks     clobetasol cream (TEMOVATE) 9.52 % Apply 1 application topically daily as needed (for contact dermatitis). Contact dermatitis  3   clopidogrel (PLAVIX) 75 MG tablet TAKE 1 TABLET EVERY DAY 90 tablet 3   famotidine (PEPCID) 20 MG tablet Take 1 tablet (20 mg total) by mouth 2 (two) times daily. 180 tablet 3   glucose blood (ONE TOUCH ULTRA TEST) test strip Use as directed once daily to check blood sugar.  Diagnosis code E11.42 100 each 11   latanoprost (XALATAN) 0.005 % ophthalmic solution Place 1 drop into both eyes at bedtime. 2.5 mL 12   levothyroxine (SYNTHROID) 125 MCG tablet TAKE 1 TABLET EVERY DAY BEFORE BREAKFAST 90 tablet 3   metFORMIN (GLUCOPHAGE) 1000 MG tablet TAKE 1 TABLET EVERY DAY 90 tablet 0   metoprolol succinate (TOPROL-XL) 25 MG 24 hr tablet TAKE 1 TABLET EVERY MORNING 90 tablet 2   Multiple Vitamins-Minerals (ICAPS AREDS 2 PO) Take 2 capsules by mouth daily.     tiZANidine (ZANAFLEX) 4 MG tablet TAKE 1 TABLET (4 MG TOTAL) BY MOUTH EVERY 6 (SIX) HOURS AS NEEDED FOR MUSCLE SPASMS. 360 tablet 3   No current facility-administered medications for this visit.     Past Medical History:  Diagnosis Date   Acute blood loss anemia  05/15/2011   post surgical   Altered mental status 05/15/2011   Anxiety    B12 deficiency 09/23/2016   Blood transfusion without reported diagnosis    Cancer ([place])    basal and squamous skin carcinoma    Cataract    Chronic kidney disease    hx of uti due to catheter hx of prostatitis, BPH -    Depression    Diabetes mellitus    GERD (gastroesophageal reflux disease)    Glaucoma 01/04/2013   Heart murmur    History of nuclear stress test    Myoview 3/17: EF 62%, no ischemia or scar, low risk   Hx of transient ischemic attack (TIA) 01/04/2013   Hx: UTI (urinary tract infection)    Hyperlipidemia    Hypertension    Hypokalemia 05/15/2011   Hypothyroidism    Insomnia    Macular degeneration    Neuropathy    OA (osteoarthritis)    Pneumonia    PVC's (premature ventricular contractions)    Renal mass    Stroke ([place])    mid 1990s     Past Surgical History:  Procedure Laterality Date   APPENDECTOMY     CATARACT EXTRACTION W/ INTRAOCULAR LENS  IMPLANT, BILATERAL     INGUINAL HERNIA REPAIR     X3   LUMBAR LAMINECTOMY/DECOMPRESSION MICRODISCECTOMY N/A 02/12/2017   Procedure: Lumbar Three-Four, Lumbar Four-Five, Lumbar Five-Sacral One  Laminectomy and Foraminotomy;  Surgeon: Earnie Larsson, MD;  Location: [facility];  Service: Neurosurgery;  Laterality: N/A;   OTHER SURGICAL HISTORY     microwave procedure for prostate - 09/12    PROSTATE ABLATION     microwave   REPLACEMENT TOTAL KNEE     THYROIDECTOMY     TONSILLECTOMY AND ADENOIDECTOMY     TOTAL KNEE ARTHROPLASTY  05/11/2011   Procedure: TOTAL KNEE ARTHROPLASTY;  Surgeon: Gearlean Alf;  Location: [facility];  Service: Orthopedics;  Laterality: Right;   US ECHOCARDIOGRAPHY  11/10/2006   EF 55-60%   US ECHOCARDIOGRAPHY  09/20/2002   EF 65-70%    Social History   Socioeconomic History   Marital status: Married    Spouse name: Not on file   Number of children: 3   Years of education: Not on file   Highest education level: Not on  file  Occupational History   Occupation: retired    Fish farm manager: RETIRED    Comment: [employer]  Tobacco Use   Smoking status: Never   Smokeless tobacco: Never  Vaping Use   Vaping Use: Never used  Substance and Sexual Activity   Alcohol use: Yes    Alcohol/week: 7.0 standard drinks    Types: 7 Shots of liquor per week    Comment: 2 drinks most days   Drug use: No   Sexual activity: Never    Birth control/protection: None  Other Topics Concern   Not on file  Social History Narrative   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, 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[employer]      Home Situation: living with wife      Spiritual Beliefs: episcopalian      Lifestyle: walks daily, working on diet            Social Determinants of Radio broadcast assistant Strain: Low Risk    Difficulty of Paying Living Expenses: Not hard at all  Food Insecurity: No Food Insecurity   Worried About Charity fundraiser in the Last Year: Never true   Arboriculturist in the Last Year: Never true  Transportation Needs: No Transportation Needs   Lack of Transportation (Medical): No   Lack of Transportation (Non-Medical): No  Physical Activity: Inactive   Days of Exercise per Week: 0 days   Minutes of Exercise per Session: 0 min  Stress: No Stress Concern Present   Feeling of Stress : Not at all  Social Connections: Not on file  Intimate Partner Violence: Not on file    Family History  Problem Relation Age of Onset   Hypertension Mother    Arthritis Mother    Diabetes Mother    Hypertension Father    Stroke Father    Kidney disease Father    Hypertension Brother    Diabetes Brother     ROS: no fevers or chills, productive cough, hemoptysis, dysphasia, odynophagia, melena, hematochezia, dysuria, hematuria, rash, seizure activity, orthopnea, PND, pedal edema, claudication. Remaining systems are negative.  Physical Exam: Well-developed well-nourished in no acute distress.  Skin is warm and dry.   HEENT is normal.  Neck is supple.  Chest is clear to auscultation with normal expansion.  Cardiovascular exam is irregular, 2/6 systolic murmur left sternal border. Abdominal exam nontender or distended. No masses palpated. Extremities show no edema. neuro grossly intact  ECG-atrial fibrillation at a rate of 85, cannot rule out septal infarct.  Personally reviewed  A/P  1  question history of syncope-previous evaluation unrevealing and patient has had no recurrent episodes.  2 aortic stenosis-plan follow-up echocardiogram.  3 hypertension-patient's blood pressure is controlled.  Continue present medical regimen.  4 palpitations-continue beta-blocker.    5 history of TIA-we are initiating apixaban for atrial fibrillation.  We will discontinue aspirin and Plavix when apixaban begins.  6 hyperlipidemia-Per primary care.  7 new onset atrial fibrillation-patient is in atrial fibrillation today.  He states he has had increased fatigue for approximately 1 week and this is likely the cause.  CHA2DS2-VASc is 6.  We will discontinue aspirin and Plavix.  Check renal function (recent creatinine greater than 1.5 but typically runs less; if creatinine remains 1.5 or higher would need 2.5 mg of apixaban twice daily; if less than 1.5 would need 5 mg twice daily).  Recent TSH normal.  Continue beta-blocker as his heart rate is controlled.  Once he has been anticoagulated we will plan cardioversion as he is symptomatic with increased fatigue.  If atrial fibrillation recurs we will add antiarrhythmic likely amiodarone.  Kirk Ruths, MD

## 2021-05-30 ENCOUNTER — Other Ambulatory Visit: Payer: Self-pay

## 2021-05-30 ENCOUNTER — Ambulatory Visit (INDEPENDENT_AMBULATORY_CARE_PROVIDER_SITE_OTHER): Payer: Medicare Other | Admitting: Cardiology

## 2021-05-30 ENCOUNTER — Encounter: Payer: Self-pay | Admitting: Cardiology

## 2021-05-30 VITALS — BP 120/78 | HR 85 | Ht 73.0 in | Wt 180.2 lb

## 2021-05-30 DIAGNOSIS — I4819 Other persistent atrial fibrillation: Secondary | ICD-10-CM

## 2021-05-30 DIAGNOSIS — I119 Hypertensive heart disease without heart failure: Secondary | ICD-10-CM

## 2021-05-30 DIAGNOSIS — I10 Essential (primary) hypertension: Secondary | ICD-10-CM | POA: Diagnosis not present

## 2021-05-30 DIAGNOSIS — E7800 Pure hypercholesterolemia, unspecified: Secondary | ICD-10-CM

## 2021-05-30 DIAGNOSIS — I350 Nonrheumatic aortic (valve) stenosis: Secondary | ICD-10-CM

## 2021-05-30 LAB — BASIC METABOLIC PANEL
BUN/Creatinine Ratio: 12 (ref 10–24)
BUN: 14 mg/dL (ref 10–36)
CO2: 22 mmol/L (ref 20–29)
Calcium: 9.3 mg/dL (ref 8.6–10.2)
Chloride: 104 mmol/L (ref 96–106)
Creatinine, Ser: 1.18 mg/dL (ref 0.76–1.27)
Glucose: 109 mg/dL — ABNORMAL HIGH (ref 70–99)
Potassium: 4.8 mmol/L (ref 3.5–5.2)
Sodium: 139 mmol/L (ref 134–144)
eGFR: 59 mL/min/{1.73_m2} — ABNORMAL LOW (ref >59)

## 2021-05-30 MED ORDER — METOPROLOL SUCCINATE ER 25 MG PO TB24: 25.0000 mg | ORAL_TABLET | Freq: Every morning | ORAL | 3 refills | Status: DC

## 2021-05-30 NOTE — Patient Instructions (Signed)
°  Testing/Procedures:  Your physician has requested that you have an echocardiogram. Echocardiography is a painless test that uses sound waves to create images of your heart. It provides your doctor with information about the size and shape of your heart and how well your hearts chambers and valves are working. This procedure takes approximately one hour. There are no restrictions for this procedure. [place]   Follow-Up: At [facility], you and your health needs are our priority.  As part of our continuing mission to provide you with exceptional heart care, we have created designated Provider Care Teams.  These Care Teams include your primary Cardiologist (physician) and Advanced Practice Providers (APPs -  Physician Assistants and Nurse Practitioners) who all work together to provide you with the care you need, when you need it.  We recommend signing up for the patient portal called "MyChart".  Sign up information is provided on this After Visit Summary.  MyChart is used to connect with patients for Virtual Visits (Telemedicine).  Patients are able to view lab/test results, encounter notes, upcoming appointments, etc.  Non-urgent messages can be sent to your provider as well.   To learn more about what you can do with MyChart, go to [email or web address].    Your next appointment:   6 week(s)  The format for your next appointment:   In Person  Provider:   Kirk Ruths, MD

## 2021-06-02 ENCOUNTER — Telehealth: Payer: Self-pay | Admitting: *Deleted

## 2021-06-02 NOTE — Telephone Encounter (Signed)
-----   Message from [name], MD sent at 06/02/2021  7:19 AM EST ----- DC ASA and plavix; apixaban 5 mg BID Kirk Ruths

## 2021-06-02 NOTE — Telephone Encounter (Signed)
Left message for pt to call.

## 2021-06-03 MED ORDER — APIXABAN 5 MG PO TABS: 5.0000 mg | ORAL_TABLET | Freq: Two times a day (BID) | ORAL | 1 refills | Status: DC

## 2021-06-03 NOTE — Telephone Encounter (Signed)
Spoke with pt regarding results. Results explained and recommendations per Dr. Stanford Breed given to pt. Eliquis prescription explained to pt and all questions answered. Prescription sent to pt's pharmacy. Pt verbalizes understanding. Medication list updated.

## 2021-06-03 NOTE — Telephone Encounter (Signed)
Daryel is returning Debra's call. Please advise.

## 2021-06-18 ENCOUNTER — Other Ambulatory Visit: Payer: Self-pay

## 2021-06-18 ENCOUNTER — Ambulatory Visit (INDEPENDENT_AMBULATORY_CARE_PROVIDER_SITE_OTHER): Payer: Medicare Other | Admitting: Ophthalmology

## 2021-06-18 ENCOUNTER — Encounter (INDEPENDENT_AMBULATORY_CARE_PROVIDER_SITE_OTHER): Payer: Self-pay | Admitting: Ophthalmology

## 2021-06-18 DIAGNOSIS — H353113 Nonexudative age-related macular degeneration, right eye, advanced atrophic without subfoveal involvement: Secondary | ICD-10-CM

## 2021-06-18 DIAGNOSIS — H353211 Exudative age-related macular degeneration, right eye, with active choroidal neovascularization: Secondary | ICD-10-CM | POA: Diagnosis not present

## 2021-06-18 MED ORDER — BEVACIZUMAB 2.5 MG/0.1ML IZ SOSY
2.5000 mg | PREFILLED_SYRINGE | INTRAVITREAL | Status: AC | PRN
  Administered 2021-06-18: 2.5 mg via INTRAVITREAL

## 2021-06-18 NOTE — Assessment & Plan Note (Signed)
History of multiple recurrences, currently stable at 7-week follow-up.  Repeat injection today, no hemorrhage threatening fovea yet clinically on the edge of the temporal aspect of the atrophy hemorrhages seen

## 2021-06-18 NOTE — Assessment & Plan Note (Signed)
Accounts for acuity 

## 2021-06-18 NOTE — Progress Notes (Signed)
06/18/2021     CHIEF COMPLAINT Patient presents for  Chief Complaint  Patient presents with   Retina Follow Up      HISTORY OF PRESENT ILLNESS: Stephen Wilkinson is a 88 y.o. male who presents to the clinic today for:   HPI     Retina Follow Up           Diagnosis: Wet AMD   Laterality: right eye   Onset: 7 weeks ago   Duration: 7 weeks   Course: stable         Comments   7 week dilate OD Avastin OCT. Patient states vision is stable and unchanged since last visit. Denies any new floaters or FOL. Pt states "the only thing is I have a heart issue that has developed since my last visit, AFIB, under cardiologist Dr. Stanford Breed." Pt states he started Eliquis for that, and states he notices it has impacted his eye sight. Pt states he uses Latanoprost QHS OU.        Last edited by Laurin Coder on 06/18/2021 10:26 AM.      Referring physician: Biagio Borg, MD [place],  [place] [id number]  HISTORICAL INFORMATION:   Selected notes from the MEDICAL RECORD NUMBER    Lab Results  Component Value Date   HGBA1C 7.0 (H) 05/21/2021     CURRENT MEDICATIONS: Current Outpatient Medications (Ophthalmic Drugs)  Medication Sig   latanoprost (XALATAN) 0.005 % ophthalmic solution Place 1 drop into both eyes at bedtime.   No current facility-administered medications for this visit. (Ophthalmic Drugs)   Current Outpatient Medications (Other)  Medication Sig   apixaban (ELIQUIS) 5 MG TABS tablet Take 1 tablet (5 mg total) by mouth 2 (two) times daily.   Bevacizumab (AVASTIN IV) Place 1 drop into the right eye as directed. Every 6-8 weeks   clobetasol cream (TEMOVATE) 7.01 % Apply 1 application topically daily as needed (for contact dermatitis). Contact dermatitis   famotidine (PEPCID) 20 MG tablet Take 1 tablet (20 mg total) by mouth 2 (two) times daily.   glucose blood (ONE TOUCH ULTRA TEST) test strip Use as directed once daily to check blood sugar.  Diagnosis  code E11.42   levothyroxine (SYNTHROID) 125 MCG tablet TAKE 1 TABLET EVERY DAY BEFORE BREAKFAST   metFORMIN (GLUCOPHAGE) 1000 MG tablet TAKE 1 TABLET EVERY DAY   metoprolol succinate (TOPROL-XL) 25 MG 24 hr tablet Take 1 tablet (25 mg total) by mouth every morning.   Multiple Vitamins-Minerals (ICAPS AREDS 2 PO) Take 2 capsules by mouth daily.   tiZANidine (ZANAFLEX) 4 MG tablet TAKE 1 TABLET (4 MG TOTAL) BY MOUTH EVERY 6 (SIX) HOURS AS NEEDED FOR MUSCLE SPASMS.   No current facility-administered medications for this visit. (Other)      REVIEW OF SYSTEMS:    ALLERGIES No Known Allergies  PAST MEDICAL HISTORY Past Medical History:  Diagnosis Date   Acute blood loss anemia 05/15/2011   post surgical   Altered mental status 05/15/2011   Anxiety    B12 deficiency 09/23/2016   Blood transfusion without reported diagnosis    Cancer ([place])    basal and squamous skin carcinoma    Cataract    Chronic kidney disease    hx of uti due to catheter hx of prostatitis, BPH -    Depression    Diabetes mellitus    GERD (gastroesophageal reflux disease)    Glaucoma 01/04/2013   Heart murmur    History  of nuclear stress test    Myoview 3/17: EF 62%, no ischemia or scar, low risk   Hx of transient ischemic attack (TIA) 01/04/2013   Hx: UTI (urinary tract infection)    Hyperlipidemia    Hypertension    Hypokalemia 05/15/2011   Hypothyroidism    Insomnia    Macular degeneration    Neuropathy    OA (osteoarthritis)    Pneumonia    PVC's (premature ventricular contractions)    Renal mass    Stroke ([place])    mid 1990s    Past Surgical History:  Procedure Laterality Date   APPENDECTOMY     CATARACT EXTRACTION W/ INTRAOCULAR LENS  IMPLANT, BILATERAL     INGUINAL HERNIA REPAIR     X3   LUMBAR LAMINECTOMY/DECOMPRESSION MICRODISCECTOMY N/A 02/12/2017   Procedure: Lumbar Three-Four, Lumbar Four-Five, Lumbar Five-Sacral One Laminectomy and Foraminotomy;  Surgeon: Earnie Larsson, MD;  Location:  [facility];  Service: Neurosurgery;  Laterality: N/A;   OTHER SURGICAL HISTORY     microwave procedure for prostate - 09/12    PROSTATE ABLATION     microwave   REPLACEMENT TOTAL KNEE     THYROIDECTOMY     TONSILLECTOMY AND ADENOIDECTOMY     TOTAL KNEE ARTHROPLASTY  05/11/2011   Procedure: TOTAL KNEE ARTHROPLASTY;  Surgeon: Gearlean Alf;  Location: [facility];  Service: Orthopedics;  Laterality: Right;   US ECHOCARDIOGRAPHY  11/10/2006   EF 55-60%   US ECHOCARDIOGRAPHY  09/20/2002   EF 65-70%    FAMILY HISTORY Family History  Problem Relation Age of Onset   Hypertension Mother    Arthritis Mother    Diabetes Mother    Hypertension Father    Stroke Father    Kidney disease Father    Hypertension Brother    Diabetes Brother     SOCIAL HISTORY Social History   Tobacco Use   Smoking status: Never   Smokeless tobacco: Never  Vaping Use   Vaping Use: Never used  Substance Use Topics   Alcohol use: Yes    Alcohol/week: 7.0 standard drinks    Types: 7 Shots of liquor per week    Comment: 2 drinks most days   Drug use: No         OPHTHALMIC EXAM:  Base Eye Exam     Visual Acuity (ETDRS)       Right Left   Dist cc 20/50 -2+2 CF at 1'   Dist ph cc NI NI    Correction: Glasses         Tonometry (Tonopen, 10:26 AM)       Right Left   Pressure 17 16         Pupils       Pupils Dark Light APD   Right PERRL 4 3 None   Left PERRL 4 3 None         Extraocular Movement       Right Left    Full Full         Neuro/Psych     Oriented x3: Yes   Mood/Affect: Normal         Dilation     Right eye: 1.0% Mydriacyl, 2.5% Phenylephrine @ 10:26 AM           Slit Lamp and Fundus Exam     External Exam       Right Left   External Normal Normal         Slit Lamp Exam  Right Left   Lids/Lashes Normal Normal   Conjunctiva/Sclera White and quiet White and quiet   Cornea Clear Clear   Anterior Chamber Deep and quiet Deep and quiet    Iris Round and reactive Round and reactive   Lens Posterior chamber intraocular lens Posterior chamber intraocular lens   Anterior Vitreous Normal Normal         Fundus Exam       Right Left   Posterior Vitreous Normal    Disc Normal    C/D Ratio 0.35 0.35   Macula Advanced age related macular degeneration, Geographic atrophy, Hard drusen, no exudates, no clinical Subretinal neovascular membrane, no hemorrhage, Retinal pigment epithelial mottling, Retinal pigment epithelial atrophy, small hemorrhage on nonfoveal side of atrophy, temporally but not larger    Vessels Normal    Periphery Normal             IMAGING AND PROCEDURES  Imaging and Procedures for 06/18/21  Intravitreal Injection, Pharmacologic Agent - OD - Right Eye       Time Out 06/18/2021. 11:16 AM. Confirmed correct patient, procedure, site, and patient consented.   Anesthesia Topical anesthesia was used. Anesthetic medications included Lidocaine 4%.   Procedure Preparation included 5% betadine to ocular surface, 10% betadine to eyelids, Tobramycin 0.3%. A 30 gauge needle was used.   Injection: 2.5 mg bevacizumab 2.5 MG/0.1ML   Route: Intravitreal, Site: Right Eye   NDC: [phone], Lot: [id number]   Post-op Post injection exam found visual acuity of at least counting fingers. The patient tolerated the procedure well. There were no complications. The patient received written and verbal post procedure care education. Post injection medications included ocuflox.      OCT, Retina - OU - Both Eyes       Right Eye Quality was good. Scan locations included subfoveal. Central Foveal Thickness: 222. Progression has improved. Findings include abnormal foveal contour, outer retinal atrophy, inner retinal atrophy.   Left Eye Quality was good. Scan locations included subfoveal. Central Foveal Thickness: 266. Progression has been stable. Findings include epiretinal membrane, outer retinal atrophy, inner retinal  atrophy.   Notes OS with minor epiretinal membrane nasal aspect of fovea yet with atrophy no or intervention, with significant subfoveal geographic atrophy and scarring from prior CNVM And dry AMD a  OD, no sign of recurrence of CNVM 7-week interval today, stable overall no signs of intraretinal fluid or subretinal fluid             ASSESSMENT/PLAN:  Advanced nonexudative age-related macular degeneration of right eye without subfoveal involvement Accounts for acuity  Exudative age-related macular degeneration of right eye with active choroidal neovascularization ([place]) History of multiple recurrences, currently stable at 7-week follow-up.  Repeat injection today, no hemorrhage threatening fovea yet clinically on the edge of the temporal aspect of the atrophy hemorrhages seen     ICD-10-CM   1. Exudative age-related macular degeneration of right eye with active choroidal neovascularization (HCC)  H35.3211 Intravitreal Injection, Pharmacologic Agent - OD - Right Eye    OCT, Retina - OU - Both Eyes    bevacizumab (AVASTIN) SOSY 2.5 mg    2. Advanced nonexudative age-related macular degeneration of right eye without subfoveal involvement  H35.3113       1.  OD vastly improved and stabilized condition.  Stable acuity.  At 7-week follow-up post injection Avastin for wet AMD, follow-up in 7 weeks again after injection today 2.  Atrophy centrally limits acuity OS  3.  Ophthalmic Meds  Ordered this visit:  Meds ordered this encounter  Medications   bevacizumab (AVASTIN) SOSY 2.5 mg       Return in about 7 weeks (around 08/06/2021) for dilate, OD, AVASTIN OCT.  There are no Patient Instructions on file for this visit.   Explained the diagnoses, plan, and follow up with the patient and they expressed understanding.  Patient expressed understanding of the importance of proper follow up care.   Clent Demark Rayen Dafoe M.D. Diseases & Surgery of the Retina and Vitreous Retina & Diabetic  [facility] 06/18/21     Abbreviations: M myopia (nearsighted); A astigmatism; H hyperopia (farsighted); P presbyopia; Mrx spectacle prescription;  CTL contact lenses; OD right eye; OS left eye; OU both eyes  XT exotropia; ET esotropia; PEK punctate epithelial keratitis; PEE punctate epithelial erosions; DES dry eye syndrome; MGD meibomian gland dysfunction; ATs artificial tears; PFAT's preservative free artificial tears; [place] nuclear sclerotic cataract; PSC posterior subcapsular cataract; ERM epi-retinal membrane; PVD posterior vitreous detachment; RD retinal detachment; DM diabetes mellitus; DR diabetic retinopathy; NPDR non-proliferative diabetic retinopathy; PDR proliferative diabetic retinopathy; CSME clinically significant macular edema; DME diabetic macular edema; dbh dot blot hemorrhages; CWS cotton wool spot; POAG primary open angle glaucoma; C/D cup-to-disc ratio; HVF humphrey visual field; GVF goldmann visual field; OCT optical coherence tomography; IOP intraocular pressure; BRVO Branch retinal vein occlusion; CRVO central retinal vein occlusion; CRAO central retinal artery occlusion; BRAO branch retinal artery occlusion; RT retinal tear; SB scleral buckle; PPV pars plana vitrectomy; VH Vitreous hemorrhage; PRP panretinal laser photocoagulation; IVK intravitreal kenalog; VMT vitreomacular traction; MH Macular hole;  NVD neovascularization of the disc; NVE neovascularization elsewhere; AREDS age related eye disease study; ARMD age related macular degeneration; POAG primary open angle glaucoma; EBMD epithelial/anterior basement membrane dystrophy; ACIOL anterior chamber intraocular lens; IOL intraocular lens; PCIOL posterior chamber intraocular lens; Phaco/IOL phacoemulsification with intraocular lens placement; [place] photorefractive keratectomy; LASIK laser assisted in situ keratomileusis; HTN hypertension; DM diabetes mellitus; COPD chronic obstructive pulmonary disease

## 2021-06-24 ENCOUNTER — Other Ambulatory Visit: Payer: Self-pay | Admitting: Internal Medicine

## 2021-06-24 NOTE — Telephone Encounter (Signed)
Please refill as per office routine med refill policy (all routine meds to be refilled for 3 mo or monthly (per pt preference) up to one year from last visit, then month to month grace period for 3 mo, then further med refills will have to be denied) ? ?

## 2021-06-30 ENCOUNTER — Ambulatory Visit (HOSPITAL_COMMUNITY): Payer: Medicare Other | Attending: Cardiology

## 2021-06-30 ENCOUNTER — Other Ambulatory Visit: Payer: Self-pay

## 2021-06-30 DIAGNOSIS — I4819 Other persistent atrial fibrillation: Secondary | ICD-10-CM | POA: Insufficient documentation

## 2021-06-30 LAB — ECHOCARDIOGRAM COMPLETE
AR max vel: 1.68 cm2
AV Area VTI: 1.53 cm2
AV Area mean vel: 1.62 cm2
AV Mean grad: 12 mmHg
AV Peak grad: 22.1 mmHg
Ao pk vel: 2.35 m/s
Area-P 1/2: 3.2 cm2
S' Lateral: 2.1 cm

## 2021-07-01 NOTE — Progress Notes (Signed)
VEL:[id number] hypertension and atrial fibrillation. Also history of TIA. Nuclear study March 2017 showed ejection fraction 62% and no ischemia.  Carotid Dopplers May 2020 showed 1 to 39% bilateral stenosis. Question episodes of syncope at previous ov. Monitor 4/21 showed sinus with PACs, PVCs brief PAT but no pauses.  Found to be in atrial fibrillation at last office visit May 30, 2021.  Apixaban was initiated with plans for cardioversion if atrial fibrillation persisted as he did complain of increased fatigue.  Echocardiogram repeated January 2023 and showed normal LV function, mild left ventricular hypertrophy, mild right ventricular enlargement, mild left atrial enlargement, mild to moderate tricuspid regurgitation, mildly dilated ascending aorta, moderate aortic stenosis with mean gradient 12 mmHg, aortic valve area 1.5 cm and dimensionless index 0.3.  Since last seen, he denies dyspnea, chest pain, palpitations, syncope or bleeding.  Current Outpatient Medications  Medication Sig Dispense Refill   apixaban (ELIQUIS) 5 MG TABS tablet Take 1 tablet (5 mg total) by mouth 2 (two) times daily. 180 tablet 1   Bevacizumab (AVASTIN IV) Place 1 drop into the right eye as directed. Every 6-8 weeks     clobetasol cream (TEMOVATE) 0.25 % Apply 1 application topically daily as needed (for contact dermatitis). Contact dermatitis  3   famotidine (PEPCID) 20 MG tablet Take 1 tablet (20 mg total) by mouth 2 (two) times daily. 180 tablet 3   glucose blood (ONE TOUCH ULTRA TEST) test strip Use as directed once daily to check blood sugar.  Diagnosis code E11.42 100 each 11   latanoprost (XALATAN) 0.005 % ophthalmic solution Place 1 drop into both eyes at bedtime. 2.5 mL 12   levothyroxine (SYNTHROID) 125 MCG tablet TAKE 1 TABLET EVERY DAY BEFORE BREAKFAST 90 tablet 3   metFORMIN (GLUCOPHAGE) 1000 MG tablet TAKE 1 TABLET EVERY DAY 90 tablet 0   metoprolol succinate (TOPROL-XL) 25 MG 24 hr tablet Take 1  tablet (25 mg total) by mouth every morning. 90 tablet 3   Multiple Vitamins-Minerals (ICAPS AREDS 2 PO) Take 2 capsules by mouth daily.     tiZANidine (ZANAFLEX) 4 MG tablet TAKE 1 TABLET (4 MG TOTAL) BY MOUTH EVERY 6 (SIX) HOURS AS NEEDED FOR MUSCLE SPASMS. 360 tablet 3   No current facility-administered medications for this visit.     Past Medical History:  Diagnosis Date   Acute blood loss anemia 05/15/2011   post surgical   Altered mental status 05/15/2011   Anxiety    B12 deficiency 09/23/2016   Blood transfusion without reported diagnosis    Cancer ([place])    basal and squamous skin carcinoma    Cataract    Chronic kidney disease    hx of uti due to catheter hx of prostatitis, BPH -    Depression    Diabetes mellitus    GERD (gastroesophageal reflux disease)    Glaucoma 01/04/2013   Heart murmur    History of nuclear stress test    Myoview 3/17: EF 62%, no ischemia or scar, low risk   Hx of transient ischemic attack (TIA) 01/04/2013   Hx: UTI (urinary tract infection)    Hyperlipidemia    Hypertension    Hypokalemia 05/15/2011   Hypothyroidism    Insomnia    Macular degeneration    Neuropathy    OA (osteoarthritis)    Pneumonia    PVC's (premature ventricular contractions)    Renal mass    Stroke ([place])    mid 1990s  Past Surgical History:  Procedure Laterality Date   APPENDECTOMY     CATARACT EXTRACTION W/ INTRAOCULAR LENS  IMPLANT, BILATERAL     INGUINAL HERNIA REPAIR     X3   LUMBAR LAMINECTOMY/DECOMPRESSION MICRODISCECTOMY N/A 02/12/2017   Procedure: Lumbar Three-Four, Lumbar Four-Five, Lumbar Five-Sacral One Laminectomy and Foraminotomy;  Surgeon: Earnie Larsson, MD;  Location: [facility];  Service: Neurosurgery;  Laterality: N/A;   OTHER SURGICAL HISTORY     microwave procedure for prostate - 09/12    PROSTATE ABLATION     microwave   REPLACEMENT TOTAL KNEE     THYROIDECTOMY     TONSILLECTOMY AND ADENOIDECTOMY     TOTAL KNEE ARTHROPLASTY  05/11/2011    Procedure: TOTAL KNEE ARTHROPLASTY;  Surgeon: Gearlean Alf;  Location: [facility];  Service: Orthopedics;  Laterality: Right;   US ECHOCARDIOGRAPHY  11/10/2006   EF 55-60%   US ECHOCARDIOGRAPHY  09/20/2002   EF 65-70%    Social History   Socioeconomic History   Marital status: Married    Spouse name: Not on file   Number of children: 3   Years of education: Not on file   Highest education level: Not on file  Occupational History   Occupation: retired    Fish farm manager: RETIRED    Comment: [employer]  Tobacco Use   Smoking status: Never   Smokeless tobacco: Never  Vaping Use   Vaping Use: Never used  Substance and Sexual Activity   Alcohol use: Yes    Alcohol/week: 7.0 standard drinks    Types: 7 Shots of liquor per week    Comment: 2 drinks most days   Drug use: No   Sexual activity: Never    Birth control/protection: None  Other Topics Concern   Not on file  Social History Narrative   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, 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[employer]      Home Situation: living with wife      Spiritual Beliefs: episcopalian      Lifestyle: walks daily, working on diet            Social Determinants of Radio broadcast assistant Strain: Low Risk    Difficulty of Paying Living Expenses: Not hard at all  Food Insecurity: No Food Insecurity   Worried About Charity fundraiser in the Last Year: Never true   Arboriculturist in the Last Year: Never true  Transportation Needs: No Transportation Needs   Lack of Transportation (Medical): No   Lack of Transportation (Non-Medical): No  Physical Activity: Inactive   Days of Exercise per Week: 0 days   Minutes of Exercise per Session: 0 min  Stress: No Stress Concern Present   Feeling of Stress : Not at all  Social Connections: Not on file  Intimate Partner Violence: Not on file    Family History  Problem Relation Age of Onset   Hypertension Mother    Arthritis Mother    Diabetes Mother    Hypertension Father     Stroke Father    Kidney disease Father    Hypertension Brother    Diabetes Brother     ROS: no fevers or chills, productive cough, hemoptysis, dysphasia, odynophagia, melena, hematochezia, dysuria, hematuria, rash, seizure activity, orthopnea, PND, pedal edema, claudication. Remaining systems are negative.  Physical Exam: Well-developed well-nourished in no acute distress.  Skin is warm and dry.  HEENT is normal.  Neck is supple.  Chest is clear  to auscultation with normal expansion.  Cardiovascular exam is irregular, 2/6 systolic murmur left sternal border.  S2 is not diminished. Abdominal exam nontender or distended. No masses palpated. Extremities show no edema. neuro grossly intact  ECG-atrial fibrillation at a rate of 92, left axis deviation, cannot rule out septal infarct.  Personally reviewed  A/P  1 recently diagnosed atrial fibrillation-continue beta-blocker for rate control.  Continue apixaban.  He describes some fatigue which may be related to atrial fibrillation.  We will arrange a cardioversion in 3 weeks (I have stressed that he should miss no doses of Eliquis prior to his procedure; he thinks he may have missed 1 or 2 doses since it was initiated in early December).  If he feels better are plan will be rhythm control.  If his symptoms do not change if sinus is reestablished we will plan rate control in the future if atrial fibrillation recurs.  2 aortic stenosis-moderate on most recent echocardiogram.  He will need follow-up studies in the future and may require TAVR.  3 hypertension-blood pressure controlled.  Continue present medical regimen.  4 question history of syncope-patient has had no recurrent episodes and previous evaluation unrevealing.  5 history of TIA-continue apixaban.  He will need long-term anticoagulation given diagnosis of atrial fibrillation.  6 hyperlipidemia-managed by primary care.  Kirk Ruths, MD

## 2021-07-01 NOTE — H&P (View-Only) (Signed)
CNO:[id number] hypertension and atrial fibrillation. Also history of TIA. Nuclear study March 2017 showed ejection fraction 62% and no ischemia.  Carotid Dopplers May 2020 showed 1 to 39% bilateral stenosis. Question episodes of syncope at previous ov. Monitor 4/21 showed sinus with PACs, PVCs brief PAT but no pauses.  Found to be in atrial fibrillation at last office visit May 30, 2021.  Apixaban was initiated with plans for cardioversion if atrial fibrillation persisted as he did complain of increased fatigue.  Echocardiogram repeated January 2023 and showed normal LV function, mild left ventricular hypertrophy, mild right ventricular enlargement, mild left atrial enlargement, mild to moderate tricuspid regurgitation, mildly dilated ascending aorta, moderate aortic stenosis with mean gradient 12 mmHg, aortic valve area 1.5 cm and dimensionless index 0.3.  Since last seen, he denies dyspnea, chest pain, palpitations, syncope or bleeding.  Current Outpatient Medications  Medication Sig Dispense Refill   apixaban (ELIQUIS) 5 MG TABS tablet Take 1 tablet (5 mg total) by mouth 2 (two) times daily. 180 tablet 1   Bevacizumab (AVASTIN IV) Place 1 drop into the right eye as directed. Every 6-8 weeks     clobetasol cream (TEMOVATE) 6.29 % Apply 1 application topically daily as needed (for contact dermatitis). Contact dermatitis  3   famotidine (PEPCID) 20 MG tablet Take 1 tablet (20 mg total) by mouth 2 (two) times daily. 180 tablet 3   glucose blood (ONE TOUCH ULTRA TEST) test strip Use as directed once daily to check blood sugar.  Diagnosis code E11.42 100 each 11   latanoprost (XALATAN) 0.005 % ophthalmic solution Place 1 drop into both eyes at bedtime. 2.5 mL 12   levothyroxine (SYNTHROID) 125 MCG tablet TAKE 1 TABLET EVERY DAY BEFORE BREAKFAST 90 tablet 3   metFORMIN (GLUCOPHAGE) 1000 MG tablet TAKE 1 TABLET EVERY DAY 90 tablet 0   metoprolol succinate (TOPROL-XL) 25 MG 24 hr tablet Take 1  tablet (25 mg total) by mouth every morning. 90 tablet 3   Multiple Vitamins-Minerals (ICAPS AREDS 2 PO) Take 2 capsules by mouth daily.     tiZANidine (ZANAFLEX) 4 MG tablet TAKE 1 TABLET (4 MG TOTAL) BY MOUTH EVERY 6 (SIX) HOURS AS NEEDED FOR MUSCLE SPASMS. 360 tablet 3   No current facility-administered medications for this visit.     Past Medical History:  Diagnosis Date   Acute blood loss anemia 05/15/2011   post surgical   Altered mental status 05/15/2011   Anxiety    B12 deficiency 09/23/2016   Blood transfusion without reported diagnosis    Cancer ([place])    basal and squamous skin carcinoma    Cataract    Chronic kidney disease    hx of uti due to catheter hx of prostatitis, BPH -    Depression    Diabetes mellitus    GERD (gastroesophageal reflux disease)    Glaucoma 01/04/2013   Heart murmur    History of nuclear stress test    Myoview 3/17: EF 62%, no ischemia or scar, low risk   Hx of transient ischemic attack (TIA) 01/04/2013   Hx: UTI (urinary tract infection)    Hyperlipidemia    Hypertension    Hypokalemia 05/15/2011   Hypothyroidism    Insomnia    Macular degeneration    Neuropathy    OA (osteoarthritis)    Pneumonia    PVC's (premature ventricular contractions)    Renal mass    Stroke ([place])    mid 1990s  Past Surgical History:  Procedure Laterality Date   APPENDECTOMY     CATARACT EXTRACTION W/ INTRAOCULAR LENS  IMPLANT, BILATERAL     INGUINAL HERNIA REPAIR     X3   LUMBAR LAMINECTOMY/DECOMPRESSION MICRODISCECTOMY N/A 02/12/2017   Procedure: Lumbar Three-Four, Lumbar Four-Five, Lumbar Five-Sacral One Laminectomy and Foraminotomy;  Surgeon: Earnie Larsson, MD;  Location: [facility];  Service: Neurosurgery;  Laterality: N/A;   OTHER SURGICAL HISTORY     microwave procedure for prostate - 09/12    PROSTATE ABLATION     microwave   REPLACEMENT TOTAL KNEE     THYROIDECTOMY     TONSILLECTOMY AND ADENOIDECTOMY     TOTAL KNEE ARTHROPLASTY  05/11/2011    Procedure: TOTAL KNEE ARTHROPLASTY;  Surgeon: Gearlean Alf;  Location: [facility];  Service: Orthopedics;  Laterality: Right;   US ECHOCARDIOGRAPHY  11/10/2006   EF 55-60%   US ECHOCARDIOGRAPHY  09/20/2002   EF 65-70%    Social History   Socioeconomic History   Marital status: Married    Spouse name: Not on file   Number of children: 3   Years of education: Not on file   Highest education level: Not on file  Occupational History   Occupation: retired    Fish farm manager: RETIRED    Comment: [employer]  Tobacco Use   Smoking status: Never   Smokeless tobacco: Never  Vaping Use   Vaping Use: Never used  Substance and Sexual Activity   Alcohol use: Yes    Alcohol/week: 7.0 standard drinks    Types: 7 Shots of liquor per week    Comment: 2 drinks most days   Drug use: No   Sexual activity: Never    Birth control/protection: None  Other Topics Concern   Not on file  Social History Narrative   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, 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[employer]      Home Situation: living with wife      Spiritual Beliefs: episcopalian      Lifestyle: walks daily, working on diet            Social Determinants of Radio broadcast assistant Strain: Low Risk    Difficulty of Paying Living Expenses: Not hard at all  Food Insecurity: No Food Insecurity   Worried About Charity fundraiser in the Last Year: Never true   Arboriculturist in the Last Year: Never true  Transportation Needs: No Transportation Needs   Lack of Transportation (Medical): No   Lack of Transportation (Non-Medical): No  Physical Activity: Inactive   Days of Exercise per Week: 0 days   Minutes of Exercise per Session: 0 min  Stress: No Stress Concern Present   Feeling of Stress : Not at all  Social Connections: Not on file  Intimate Partner Violence: Not on file    Family History  Problem Relation Age of Onset   Hypertension Mother    Arthritis Mother    Diabetes Mother    Hypertension Father     Stroke Father    Kidney disease Father    Hypertension Brother    Diabetes Brother     ROS: no fevers or chills, productive cough, hemoptysis, dysphasia, odynophagia, melena, hematochezia, dysuria, hematuria, rash, seizure activity, orthopnea, PND, pedal edema, claudication. Remaining systems are negative.  Physical Exam: Well-developed well-nourished in no acute distress.  Skin is warm and dry.  HEENT is normal.  Neck is supple.  Chest is clear  to auscultation with normal expansion.  Cardiovascular exam is irregular, 2/6 systolic murmur left sternal border.  S2 is not diminished. Abdominal exam nontender or distended. No masses palpated. Extremities show no edema. neuro grossly intact  ECG-atrial fibrillation at a rate of 92, left axis deviation, cannot rule out septal infarct.  Personally reviewed  A/P  1 recently diagnosed atrial fibrillation-continue beta-blocker for rate control.  Continue apixaban.  He describes some fatigue which may be related to atrial fibrillation.  We will arrange a cardioversion in 3 weeks (I have stressed that he should miss no doses of Eliquis prior to his procedure; he thinks he may have missed 1 or 2 doses since it was initiated in early December).  If he feels better are plan will be rhythm control.  If his symptoms do not change if sinus is reestablished we will plan rate control in the future if atrial fibrillation recurs.  2 aortic stenosis-moderate on most recent echocardiogram.  He will need follow-up studies in the future and may require TAVR.  3 hypertension-blood pressure controlled.  Continue present medical regimen.  4 question history of syncope-patient has had no recurrent episodes and previous evaluation unrevealing.  5 history of TIA-continue apixaban.  He will need long-term anticoagulation given diagnosis of atrial fibrillation.  6 hyperlipidemia-managed by primary care.  Kirk Ruths, MD

## 2021-07-03 ENCOUNTER — Encounter: Payer: Self-pay | Admitting: *Deleted

## 2021-07-08 ENCOUNTER — Encounter: Payer: Self-pay | Admitting: Cardiology

## 2021-07-08 ENCOUNTER — Ambulatory Visit (INDEPENDENT_AMBULATORY_CARE_PROVIDER_SITE_OTHER): Payer: Medicare Other | Admitting: Cardiology

## 2021-07-08 ENCOUNTER — Other Ambulatory Visit: Payer: Self-pay

## 2021-07-08 VITALS — BP 132/74 | HR 92 | Ht 73.0 in | Wt 177.3 lb

## 2021-07-08 DIAGNOSIS — I119 Hypertensive heart disease without heart failure: Secondary | ICD-10-CM

## 2021-07-08 DIAGNOSIS — E7800 Pure hypercholesterolemia, unspecified: Secondary | ICD-10-CM

## 2021-07-08 DIAGNOSIS — I4819 Other persistent atrial fibrillation: Secondary | ICD-10-CM | POA: Diagnosis not present

## 2021-07-08 DIAGNOSIS — I350 Nonrheumatic aortic (valve) stenosis: Secondary | ICD-10-CM

## 2021-07-08 LAB — BASIC METABOLIC PANEL
BUN/Creatinine Ratio: 14 (ref 10–24)
BUN: 20 mg/dL (ref 10–36)
CO2: 23 mmol/L (ref 20–29)
Calcium: 9.6 mg/dL (ref 8.6–10.2)
Chloride: 103 mmol/L (ref 96–106)
Creatinine, Ser: 1.38 mg/dL — ABNORMAL HIGH (ref 0.76–1.27)
Glucose: 134 mg/dL — ABNORMAL HIGH (ref 70–99)
Potassium: 4.5 mmol/L (ref 3.5–5.2)
Sodium: 140 mmol/L (ref 134–144)
eGFR: 49 mL/min/{1.73_m2} — ABNORMAL LOW (ref >59)

## 2021-07-08 LAB — CBC
Hematocrit: 40.5 % (ref 37.5–51.0)
Hemoglobin: 14.3 g/dL (ref 13.0–17.7)
MCH: 32.7 pg (ref 26.6–33.0)
MCHC: 35.3 g/dL (ref 31.5–35.7)
MCV: 93 fL (ref 79–97)
Platelets: 216 10*3/uL (ref 150–450)
RBC: 4.37 x10E6/uL (ref 4.14–5.80)
RDW: 11.7 % (ref 11.6–15.4)
WBC: 6.7 10*3/uL (ref 3.4–10.8)

## 2021-07-08 NOTE — Patient Instructions (Addendum)
°  You are scheduled for a Cardioversion on Wednesday 07/30/21 with Dr. Johnsie Cancel.  Please arrive at the [facility] (Main Entrance A) at [facility]: [address] [place], [place] [address] at 11 pm. (1 hour prior to procedure unless lab work is needed; if lab work is needed arrive 1.5 hours ahead)  DIET: Nothing to eat or drink after midnight except a sip of water with medications (see medication instructions below)  FYI: For your safety, and to allow [place] to monitor your vital signs accurately during the surgery/procedure we request that   if you have artificial nails, gel coating, SNS etc. Please have those removed prior to your surgery/procedure. Not having the nail coverings /polish removed may result in cancellation or delay of your surgery/procedure.   Medication Instructions: DO NOT TAKE METOPROLOL THE MORNING OF THE PROCEDURE  Continue your anticoagulant: ELIQUIS   You must have a responsible person to drive you home and stay in the waiting area during your procedure. Failure to do so could result in cancellation.   Your physician recommends that you schedule a follow-up appointment ON 08/28/21 @ 3:30 PM WITH DR [employer]abil [employer] cards.  *Special Note: Every effort is made to have your procedure done on time. Occasionally there are emergencies that occur at the hospital that may cause delays. Please be patient if a delay does occur.

## 2021-07-11 ENCOUNTER — Encounter: Payer: Self-pay | Admitting: *Deleted

## 2021-07-22 ENCOUNTER — Encounter (HOSPITAL_COMMUNITY): Payer: Self-pay | Admitting: Cardiovascular Disease

## 2021-07-24 ENCOUNTER — Other Ambulatory Visit: Payer: Self-pay

## 2021-07-24 ENCOUNTER — Ambulatory Visit (INDEPENDENT_AMBULATORY_CARE_PROVIDER_SITE_OTHER): Payer: Medicare Other | Admitting: Podiatry

## 2021-07-24 DIAGNOSIS — M79676 Pain in unspecified toe(s): Secondary | ICD-10-CM

## 2021-07-24 DIAGNOSIS — Z7901 Long term (current) use of anticoagulants: Secondary | ICD-10-CM

## 2021-07-24 DIAGNOSIS — B351 Tinea unguium: Secondary | ICD-10-CM

## 2021-07-28 NOTE — Progress Notes (Signed)
Subjective: 88 y.o. returns the office today for painful, elongated, thickened toenails which he cannot trim himself. Denies any redness or drainage around the nails.  States that the heel pain has been doing well.  Denies any systemic complaints such as fevers, chills, nausea, vomiting.   PCP: Biagio Borg, MD A1c: 7.0 on May 21, 2021  Objective: NAD DP/PT pulses palpable, CRT less than 3 seconds Nails hypertrophic, dystrophic, elongated, brittle, discolored 10. There is tenderness overlying the nails 1-5 bilaterally. There is no surrounding erythema or drainage along the nail sites. No significant pain on course or insertion of plantar fascia today.  No areas of pinpoint tenderness otherwise No open lesions or pre-ulcerative lesions are identified. No pain with calf compression, swelling, warmth, erythema.  Assessment: Patient presents with symptomatic onychomycosis  Plan: -Treatment options including alternatives, risks, complications were discussed -Nails sharply debrided 10 without complication/bleeding. -Continue stretching for plantar fasciitis as well as wearing supportive shoe gear. -Discussed daily foot inspection. If there are any changes, to call the office immediately.  -Follow-up in 3 months or sooner if any problems are to arise. In the meantime, encouraged to call the office with any questions, concerns, changes symptoms.  Celesta Gentile, DPM

## 2021-07-29 ENCOUNTER — Other Ambulatory Visit: Payer: Self-pay | Admitting: *Deleted

## 2021-07-29 DIAGNOSIS — I4819 Other persistent atrial fibrillation: Secondary | ICD-10-CM

## 2021-07-30 ENCOUNTER — Ambulatory Visit (HOSPITAL_BASED_OUTPATIENT_CLINIC_OR_DEPARTMENT_OTHER): Payer: Medicare Other | Admitting: Anesthesiology

## 2021-07-30 ENCOUNTER — Ambulatory Visit (HOSPITAL_COMMUNITY): Payer: Medicare Other | Admitting: Anesthesiology

## 2021-07-30 ENCOUNTER — Ambulatory Visit (HOSPITAL_COMMUNITY)
Admission: RE | Admit: 2021-07-30 | Discharge: 2021-07-30 | Disposition: A | Discharge status: Home / Self Care | Payer: Medicare Other | Attending: Cardiovascular Disease | Admitting: Cardiovascular Disease

## 2021-07-30 ENCOUNTER — Encounter (HOSPITAL_COMMUNITY): Payer: Self-pay | Admitting: Cardiovascular Disease

## 2021-07-30 ENCOUNTER — Ambulatory Visit (HOSPITAL_BASED_OUTPATIENT_CLINIC_OR_DEPARTMENT_OTHER)
Admission: RE | Admit: 2021-07-30 | Discharge: 2021-07-30 | Disposition: A | Discharge status: Home / Self Care | Payer: Medicare Other | Source: Home / Self Care | Attending: Cardiovascular Disease | Admitting: Cardiovascular Disease

## 2021-07-30 ENCOUNTER — Encounter (HOSPITAL_COMMUNITY)
Admission: RE | Disposition: A | Discharge status: Home / Self Care | Payer: Self-pay | Source: Home / Self Care | Attending: Cardiovascular Disease

## 2021-07-30 ENCOUNTER — Other Ambulatory Visit: Payer: Self-pay

## 2021-07-30 DIAGNOSIS — I4891 Unspecified atrial fibrillation: Secondary | ICD-10-CM

## 2021-07-30 DIAGNOSIS — I340 Nonrheumatic mitral (valve) insufficiency: Secondary | ICD-10-CM | POA: Diagnosis not present

## 2021-07-30 DIAGNOSIS — I080 Rheumatic disorders of both mitral and aortic valves: Secondary | ICD-10-CM | POA: Diagnosis not present

## 2021-07-30 DIAGNOSIS — N189 Chronic kidney disease, unspecified: Secondary | ICD-10-CM | POA: Insufficient documentation

## 2021-07-30 DIAGNOSIS — E785 Hyperlipidemia, unspecified: Secondary | ICD-10-CM | POA: Insufficient documentation

## 2021-07-30 DIAGNOSIS — I350 Nonrheumatic aortic (valve) stenosis: Secondary | ICD-10-CM

## 2021-07-30 DIAGNOSIS — I10 Essential (primary) hypertension: Secondary | ICD-10-CM | POA: Diagnosis not present

## 2021-07-30 DIAGNOSIS — F418 Other specified anxiety disorders: Secondary | ICD-10-CM | POA: Diagnosis not present

## 2021-07-30 DIAGNOSIS — E1122 Type 2 diabetes mellitus with diabetic chronic kidney disease: Secondary | ICD-10-CM | POA: Insufficient documentation

## 2021-07-30 DIAGNOSIS — Z7984 Long term (current) use of oral hypoglycemic drugs: Secondary | ICD-10-CM | POA: Diagnosis not present

## 2021-07-30 DIAGNOSIS — I4819 Other persistent atrial fibrillation: Secondary | ICD-10-CM

## 2021-07-30 DIAGNOSIS — E1142 Type 2 diabetes mellitus with diabetic polyneuropathy: Secondary | ICD-10-CM | POA: Diagnosis not present

## 2021-07-30 DIAGNOSIS — E119 Type 2 diabetes mellitus without complications: Secondary | ICD-10-CM | POA: Diagnosis not present

## 2021-07-30 DIAGNOSIS — K219 Gastro-esophageal reflux disease without esophagitis: Secondary | ICD-10-CM | POA: Insufficient documentation

## 2021-07-30 DIAGNOSIS — I1310 Hypertensive heart and chronic kidney disease without heart failure, with stage 1 through stage 4 chronic kidney disease, or unspecified chronic kidney disease: Secondary | ICD-10-CM | POA: Diagnosis not present

## 2021-07-30 DIAGNOSIS — M1990 Unspecified osteoarthritis, unspecified site: Secondary | ICD-10-CM | POA: Insufficient documentation

## 2021-07-30 DIAGNOSIS — I088 Other rheumatic multiple valve diseases: Secondary | ICD-10-CM

## 2021-07-30 DIAGNOSIS — E039 Hypothyroidism, unspecified: Secondary | ICD-10-CM | POA: Diagnosis not present

## 2021-07-30 DIAGNOSIS — Z8673 Personal history of transient ischemic attack (TIA), and cerebral infarction without residual deficits: Secondary | ICD-10-CM

## 2021-07-30 DIAGNOSIS — Z7901 Long term (current) use of anticoagulants: Secondary | ICD-10-CM | POA: Diagnosis not present

## 2021-07-30 HISTORY — PX: CARDIOVERSION: SHX1299

## 2021-07-30 LAB — POCT I-STAT, CHEM 8
BUN: 19 mg/dL (ref 8–23)
Calcium, Ion: 1.08 mmol/L — ABNORMAL LOW (ref 1.15–1.40)
Chloride: 104 mmol/L (ref 98–111)
Creatinine, Ser: 1.1 mg/dL (ref 0.61–1.24)
Glucose, Bld: 150 mg/dL — ABNORMAL HIGH (ref 70–99)
HCT: 45 % (ref 39.0–52.0)
Hemoglobin: 15.3 g/dL (ref 13.0–17.0)
Potassium: 4.3 mmol/L (ref 3.5–5.1)
Sodium: 139 mmol/L (ref 135–145)
TCO2: 25 mmol/L (ref 22–32)

## 2021-07-30 SURGERY — CARDIOVERSION
Anesthesia: General

## 2021-07-30 MED ORDER — PROPOFOL 500 MG/50ML IV EMUL
INTRAVENOUS | Status: DC | PRN
  Administered 2021-07-30: 100 ug/kg/min via INTRAVENOUS

## 2021-07-30 MED ORDER — SODIUM CHLORIDE 0.9 % IV SOLN: INTRAVENOUS | Status: DC

## 2021-07-30 NOTE — Transfer of Care (Signed)
Immediate Anesthesia Transfer of Care Note  Patient: Stephen Wilkinson  Procedure(s) Performed: CARDIOVERSION  Patient Location: Endoscopy Unit  Anesthesia Type:MAC  Level of Consciousness: awake, alert  and oriented  Airway & Oxygen Therapy: Patient Spontanous Breathing  Post-op Assessment: Report given to RN and Post -op Vital signs reviewed and stable  Post vital signs: Reviewed and stable  Last Vitals:  Vitals Value Taken Time  BP    Temp    Pulse 70 07/30/21 1231  Resp    SpO2 94 % 07/30/21 1231  Vitals shown include unvalidated device data.  Last Pain:  Vitals:   07/30/21 1135  TempSrc: Oral  PainSc: 0-No pain         Complications: No notable events documented.

## 2021-07-30 NOTE — Anesthesia Procedure Notes (Signed)
Procedure Name: MAC Date/Time: 07/30/2021 12:15 PM Performed by: Griffin Dakin, CRNA Pre-anesthesia Checklist: Patient identified, Emergency Drugs available, Suction available, Patient being monitored and Timeout performed Patient Re-evaluated:Patient Re-evaluated prior to induction Oxygen Delivery Method: Nasal cannula Induction Type: IV induction Placement Confirmation: positive ETCO2 and breath sounds checked- equal and bilateral Dental Injury: Teeth and Oropharynx as per pre-operative assessment

## 2021-07-30 NOTE — Anesthesia Postprocedure Evaluation (Signed)
Anesthesia Post Note  Patient: Stephen Wilkinson  Procedure(s) Performed: CARDIOVERSION     Patient location during evaluation: Endoscopy Anesthesia Type: General Level of consciousness: awake Pain management: pain level controlled Vital Signs Assessment: post-procedure vital signs reviewed and stable Cardiovascular status: stable Postop Assessment: no apparent nausea or vomiting Anesthetic complications: no   No notable events documented.  Last Vitals:  Vitals:   07/30/21 1135 07/30/21 1231  BP: (!) 155/91 (!) 98/59  Pulse: (!) 105 70  Resp: 16 10  Temp: (!) 36.4 C   SpO2: 98% 94%    Last Pain:  Vitals:   07/30/21 1135  TempSrc: Oral  PainSc: 0-No pain                 Aideen Fenster

## 2021-07-30 NOTE — Interval H&P Note (Signed)
History and Physical Interval Note:  07/30/2021 11:36 AM  Stephen Wilkinson  has presented today for surgery, with the diagnosis of ATRIAL FIB.  The various methods of treatment have been discussed with the patient and family. After consideration of risks, benefits and other options for treatment, the patient has consented to  Procedure(s): CARDIOVERSION (N/A) as a surgical intervention.  The patient's history has been reviewed, patient examined, no change in status, stable for surgery.  I have reviewed the patient's chart and labs.  Questions were answered to the patient's satisfaction.     Jenkins Rouge

## 2021-07-30 NOTE — Anesthesia Preprocedure Evaluation (Addendum)
Anesthesia Evaluation  Patient identified by MRN, date of birth, ID band Patient awake    Reviewed: Allergy & Precautions, Patient's Chart, lab work & pertinent test results  Airway Mallampati: II  TM Distance: >3 FB     Dental   Pulmonary neg pulmonary ROS,    breath sounds clear to auscultation       Cardiovascular hypertension, Pt. on medications and Pt. on home beta blockers + dysrhythmias (eliquis) Atrial Fibrillation + Valvular Problems/Murmurs (mild-mod AS) AS  Rhythm:Irregular  Echo 06/30/21: 1. Left ventricular ejection fraction, by estimation, is 60 to 65%. The  left ventricle has normal function. The left ventricle has no regional  wall motion abnormalities. There is mild left ventricular hypertrophy.  Left ventricular diastolic parameters  are indeterminate.  2. Right ventricular systolic function is normal. The right ventricular  size is mildly enlarged. There is normal pulmonary artery systolic  pressure. The estimated right ventricular systolic pressure is 42.5 mmHg.  3. Left atrial size was mildly dilated.  4. The mitral valve is normal in structure. Trivial mitral valve  regurgitation.  5. Tricuspid valve regurgitation is mild to moderate.  6. Aortic dilatation noted. There is mild dilatation of the ascending  aorta, measuring 39 mm.  7. The inferior vena cava is normal in size with greater than 50%  respiratory variability, suggesting right atrial pressure of 3 mmHg.  8. The aortic valve is tricuspid. There is severe calcification of the  aortic valve. Aortic valve regurgitation is not visualized. Moderate  aortic valve stenosis. Mild AS by gradients (MG 89mmHg, Vmax 2.4 m/s),  moderate by AVA (1.5cm^2) and DI (0.3).  Low SV index (31cc/m^2), suspect paradoxical low flow low gradient  moderate AS    Neuro/Psych PSYCHIATRIC DISORDERS Anxiety Depression  Neuromuscular disease (peripheral neuropathy 2/2  T2DM) CVA, No Residual Symptoms    GI/Hepatic Neg liver ROS, GERD  Controlled and Medicated,  Endo/Other  diabetes, Type 2, Oral Hypoglycemic AgentsHypothyroidism   Renal/GU Renal disease  negative genitourinary   Musculoskeletal  (+) Arthritis , Osteoarthritis,    Abdominal   Peds  Hematology negative hematology ROS (+)   Anesthesia Other Findings   Reproductive/Obstetrics negative OB ROS                            Anesthesia Physical Anesthesia Plan  ASA: 3  Anesthesia Plan: General   Post-op Pain Management:    Induction: Intravenous  PONV Risk Score and Plan: TIVA and Treatment may vary due to age or medical condition  Airway Management Planned: Natural Airway and Mask  Additional Equipment: None  Intra-op Plan:   Post-operative Plan:   Informed Consent:   Plan Discussed with:   Anesthesia Plan Comments:        Anesthesia Quick Evaluation

## 2021-07-30 NOTE — Consult Note (Signed)
Patient has some memory issues Has not taken all of his eliquis doses over last 3 weeks He blames his memory but also fact that meds not delivered By pharmacy for 203 days  He has taken it regularly last 5 days Dicussed option of TEE/DCC today and he is agreeable To add the TEE to procedure Risks including esophageal injury , aspiration, intubation in addition To risks of [facility] with pacemaker and stroke discussed willing to proceed I left voice message for his wife Claudie Fisherman MD [facility]

## 2021-07-30 NOTE — Discharge Instructions (Signed)
TEE  YOU HAD AN CARDIAC PROCEDURE TODAY: Refer to the procedure report and other information in the discharge instructions given to you for any specific questions about what was found during the examination. If this information does not answer your questions, please call [place] office at [phone] to clarify.   DIET: Your first meal following the procedure should be a light meal and then it is ok to progress to your normal diet. A half-sandwich or bowl of soup is an example of a good first meal. Heavy or fried foods are harder to digest and may make you feel nauseous or bloated. Drink plenty of fluids but you should avoid alcoholic beverages for 24 hours. If you had a esophageal dilation, please see attached instructions for diet.   ACTIVITY: Your care partner should take you home directly after the procedure. You should plan to take it easy, moving slowly for the rest of the day. You can resume normal activity the day after the procedure however YOU SHOULD NOT DRIVE, use power tools, machinery or perform tasks that involve climbing or major physical exertion for 24 hours (because of the sedation medicines used during the test).   SYMPTOMS TO REPORT IMMEDIATELY: A cardiologist can be reached at any hour. Please call [phone] for any of the following symptoms:  Vomiting of blood or coffee ground material  New, significant abdominal pain  New, significant chest pain or pain under the shoulder blades  Painful or persistently difficult swallowing  New shortness of breath  Black, tarry-looking or red, bloody stools  FOLLOW UP:  Please also call with any specific questions about appointments or follow up tests.  Electrical Cardioversion Electrical cardioversion is the delivery of a jolt of electricity to restore a normal rhythm to the heart. A rhythm that is too fast or is not regular keeps the heart from pumping well. In this procedure, sticky patches or metal paddles are placed on the  chest to deliver electricity to the heart from a device. This procedure may be done in an emergency if: There is low or no blood pressure as a result of the heart rhythm. Normal rhythm must be restored as fast as possible to protect the brain and heart from further damage. It may save a life. This may also be a scheduled procedure for irregular or fast heart rhythms that are not immediately life-threatening.  What can I expect after the procedure? Your blood pressure, heart rate, breathing rate, and blood oxygen level will be monitored until you leave the hospital or clinic. Your heart rhythm will be watched to make sure it does not change. You may have some redness on the skin where the shocks were given. Over the counter cortizone cream may be helpful.  Follow these instructions at home: Do not drive for 24 hours if you were given a sedative during your procedure. Take over-the-counter and prescription medicines only as told by your health care provider. Ask your health care provider how to check your pulse. Check it often. Rest for 48 hours after the procedure or as told by your health care provider. Avoid or limit your caffeine use as told by your health care provider. Keep all follow-up visits as told by your health care provider. This is important. Contact a health care provider if: You feel like your heart is beating too quickly or your pulse is not regular. You have a serious muscle cramp that does not go away. Get help right away if: You have discomfort in  your chest. You are dizzy or you feel faint. You have trouble breathing or you are short of breath. Your speech is slurred. You have trouble moving an arm or leg on one side of your body. Your fingers or toes turn cold or blue. Summary Electrical cardioversion is the delivery of a jolt of electricity to restore a normal rhythm to the heart. This procedure may be done right away in an emergency or may be a scheduled procedure  if the condition is not an emergency. Generally, this is a safe procedure. After the procedure, check your pulse often as told by your health care provider. This information is not intended to replace advice given to you by your health care provider. Make sure you discuss any questions you have with your health care provider. Document Revised: 01/02/2019 Document Reviewed: 01/02/2019 Elsevier Patient Education  [place]. Hospital doctor cardioversion is the delivery of a jolt of electricity to restore a normal rhythm to the heart. A rhythm that is too fast or is not regular keeps the heart from pumping well. In this procedure, sticky patches or metal paddles are placed on the chest to deliver electricity to the heart from a device. This procedure may be done in an emergency if: There is low or no blood pressure as a result of the heart rhythm. Normal rhythm must be restored as fast as possible to protect the brain and heart from further damage. It may save a life. This may also be a scheduled procedure for irregular or fast heart rhythms that are not immediately life-threatening.  What can I expect after the procedure? Your blood pressure, heart rate, breathing rate, and blood oxygen level will be monitored until you leave the hospital or clinic. Your heart rhythm will be watched to make sure it does not change. You may have some redness on the skin where the shocks were given. Over the counter cortizone cream may be helpful.  Follow these instructions at home: Do not drive for 24 hours if you were given a sedative during your procedure. Take over-the-counter and prescription medicines only as told by your health care provider. Ask your health care provider how to check your pulse. Check it often. Rest for 48 hours after the procedure or as told by your health care provider. Avoid or limit your caffeine use as told by your health care provider. Keep all follow-up  visits as told by your health care provider. This is important. Contact a health care provider if: You feel like your heart is beating too quickly or your pulse is not regular. You have a serious muscle cramp that does not go away. Get help right away if: You have discomfort in your chest. You are dizzy or you feel faint. You have trouble breathing or you are short of breath. Your speech is slurred. You have trouble moving an arm or leg on one side of your body. Your fingers or toes turn cold or blue. Summary Electrical cardioversion is the delivery of a jolt of electricity to restore a normal rhythm to the heart. This procedure may be done right away in an emergency or may be a scheduled procedure if the condition is not an emergency. Generally, this is a safe procedure. After the procedure, check your pulse often as told by your health care provider. This information is not intended to replace advice given to you by your health care provider. Make sure you discuss any questions you have with your health  care provider. Document Revised: 01/02/2019 Document Reviewed: 01/02/2019 Elsevier Patient Education  [place].

## 2021-07-30 NOTE — CV Procedure (Signed)
TEE/DCC: Anesthesia:  Propofol  No LAA thrombus No ASD/PFO Moderate AS Trivial MR LAE Normal RV No effusion  DCC x 1 120 J biphasic Converted from afib rate 111 to NSR rate 80 bpm with rare PACls  No immediate neurologic sequelae  Jenkins Rouge MD [facility]

## 2021-07-30 NOTE — Addendum Note (Signed)
Addendum  created 07/30/21 1243 by Belinda Block, MD   Order list changed

## 2021-07-31 ENCOUNTER — Encounter (HOSPITAL_COMMUNITY): Payer: Self-pay | Admitting: Cardiovascular Disease

## 2021-08-06 ENCOUNTER — Other Ambulatory Visit: Payer: Self-pay

## 2021-08-06 ENCOUNTER — Encounter (INDEPENDENT_AMBULATORY_CARE_PROVIDER_SITE_OTHER): Payer: Self-pay | Admitting: Ophthalmology

## 2021-08-06 ENCOUNTER — Ambulatory Visit (INDEPENDENT_AMBULATORY_CARE_PROVIDER_SITE_OTHER): Payer: Medicare Other | Admitting: Ophthalmology

## 2021-08-06 DIAGNOSIS — H353211 Exudative age-related macular degeneration, right eye, with active choroidal neovascularization: Secondary | ICD-10-CM

## 2021-08-06 DIAGNOSIS — H353124 Nonexudative age-related macular degeneration, left eye, advanced atrophic with subfoveal involvement: Secondary | ICD-10-CM

## 2021-08-06 DIAGNOSIS — Z9889 Other specified postprocedural states: Secondary | ICD-10-CM

## 2021-08-06 MED ORDER — BEVACIZUMAB 2.5 MG/0.1ML IZ SOSY
2.5000 mg | PREFILLED_SYRINGE | INTRAVITREAL | Status: AC | PRN
  Administered 2021-08-06: 2.5 mg via INTRAVITREAL

## 2021-08-06 NOTE — Assessment & Plan Note (Signed)
Chronic active and recurrent CNVM in the past, currently at 7-week follow-up.  We will repeat injection today and examination again in 7 weeks

## 2021-08-06 NOTE — Assessment & Plan Note (Signed)
Central geographic atrophy limits acuity

## 2021-08-06 NOTE — Assessment & Plan Note (Signed)
Stabilized and improved perimacular vision with vitrectomy release of tractional detachment in the past

## 2021-08-06 NOTE — Progress Notes (Signed)
08/06/2021     CHIEF COMPLAINT Patient presents for  Chief Complaint  Patient presents with   Macular Degeneration      HISTORY OF PRESENT ILLNESS: Stephen Wilkinson is a 88 y.o. male who presents to the clinic today for:   HPI   7 weeks dilate OD Avastin OCT. Patient states vision is stable and unchanged since last visit. Denies any new floaters or FOL. overall no interval change in acuity Pt states he broke his glasses and those were tri focals. Latanoprost QHS OU. Last edited by Hurman Horn, MD on 08/06/2021 11:33 AM.      Referring physician: Biagio Borg, MD [place],  [place] [address]  HISTORICAL INFORMATION:   Selected notes from the MEDICAL RECORD NUMBER    Lab Results  Component Value Date   HGBA1C 7.0 (H) 05/21/2021     CURRENT MEDICATIONS: Current Outpatient Medications (Ophthalmic Drugs)  Medication Sig   latanoprost (XALATAN) 0.005 % ophthalmic solution Place 1 drop into both eyes at bedtime.   No current facility-administered medications for this visit. (Ophthalmic Drugs)   Current Outpatient Medications (Other)  Medication Sig   apixaban (ELIQUIS) 5 MG TABS tablet Take 1 tablet (5 mg total) by mouth 2 (two) times daily.   Bevacizumab (AVASTIN IV) Place 1 Syringe into the right eye every 8 (eight) weeks.   famotidine (PEPCID) 20 MG tablet Take 1 tablet (20 mg total) by mouth 2 (two) times daily. (Patient taking differently: Take 20 mg by mouth in the morning.)   glucose blood (ONE TOUCH ULTRA TEST) test strip Use as directed once daily to check blood sugar.  Diagnosis code E11.42   levothyroxine (SYNTHROID) 125 MCG tablet TAKE 1 TABLET EVERY DAY BEFORE BREAKFAST   metFORMIN (GLUCOPHAGE) 1000 MG tablet TAKE 1 TABLET EVERY DAY   metoprolol succinate (TOPROL-XL) 25 MG 24 hr tablet Take 1 tablet (25 mg total) by mouth every morning.   Multiple Vitamins-Minerals (ICAPS AREDS 2 PO) Take 2 capsules by mouth daily.   tiZANidine (ZANAFLEX) 4 MG  tablet TAKE 1 TABLET (4 MG TOTAL) BY MOUTH EVERY 6 (SIX) HOURS AS NEEDED FOR MUSCLE SPASMS. (Patient not taking: Reported on 07/24/2021)   No current facility-administered medications for this visit. (Other)      REVIEW OF SYSTEMS:    ALLERGIES No Known Allergies  PAST MEDICAL HISTORY Past Medical History:  Diagnosis Date   Acute blood loss anemia 05/15/2011   post surgical   Altered mental status 05/15/2011   Anxiety    B12 deficiency 09/23/2016   Blood transfusion without reported diagnosis    Cancer ([place])    basal and squamous skin carcinoma    Cataract    Chronic kidney disease    hx of uti due to catheter hx of prostatitis, BPH -    Depression    Diabetes mellitus    GERD (gastroesophageal reflux disease)    Glaucoma 01/04/2013   Heart murmur    History of nuclear stress test    Myoview 3/17: EF 62%, no ischemia or scar, low risk   Hx of transient ischemic attack (TIA) 01/04/2013   Hx: UTI (urinary tract infection)    Hyperlipidemia    Hypertension    Hypokalemia 05/15/2011   Hypothyroidism    Insomnia    Macular degeneration    Neuropathy    OA (osteoarthritis)    Pneumonia    PVC's (premature ventricular contractions)    Renal mass  Stroke [facility])    mid 1990s    Past Surgical History:  Procedure Laterality Date   APPENDECTOMY     CARDIOVERSION N/A 07/30/2021   Procedure: CARDIOVERSION;  Surgeon: Josue Hector, MD;  Location: [facility];  Service: Cardiovascular;  Laterality: N/A;   CATARACT EXTRACTION W/ INTRAOCULAR LENS  IMPLANT, BILATERAL     INGUINAL HERNIA REPAIR     X3   LUMBAR LAMINECTOMY/DECOMPRESSION MICRODISCECTOMY N/A 02/12/2017   Procedure: Lumbar Three-Four, Lumbar Four-Five, Lumbar Five-Sacral One Laminectomy and Foraminotomy;  Surgeon: Earnie Larsson, MD;  Location: [place];  Service: Neurosurgery;  Laterality: N/A;   OTHER SURGICAL HISTORY     microwave procedure for prostate - 09/12    PROSTATE ABLATION     microwave   REPLACEMENT TOTAL  KNEE     THYROIDECTOMY     TONSILLECTOMY AND ADENOIDECTOMY     TOTAL KNEE ARTHROPLASTY  05/11/2011   Procedure: TOTAL KNEE ARTHROPLASTY;  Surgeon: Gearlean Alf;  Location: [facility];  Service: Orthopedics;  Laterality: Right;   US ECHOCARDIOGRAPHY  11/10/2006   EF 55-60%   US ECHOCARDIOGRAPHY  09/20/2002   EF 65-70%    FAMILY HISTORY Family History  Problem Relation Age of Onset   Hypertension Mother    Arthritis Mother    Diabetes Mother    Hypertension Father    Stroke Father    Kidney disease Father    Hypertension Brother    Diabetes Brother     SOCIAL HISTORY Social History   Tobacco Use   Smoking status: Never   Smokeless tobacco: Never  Vaping Use   Vaping Use: Never used  Substance Use Topics   Alcohol use: Yes    Alcohol/week: 7.0 standard drinks    Types: 7 Shots of liquor per week    Comment: 2 drinks most days   Drug use: No         OPHTHALMIC EXAM:  Base Eye Exam     Visual Acuity (ETDRS)       Right Left   Dist [place] 20/160 CF at 2'   Dist ph [place] 20/50 -1 NI         Tonometry (Tonopen, 11:11 AM)       Right Left   Pressure 11 10         Pupils       Pupils Dark Light APD   Right PERRL 4 3 None   Left PERRL 4 3 None         Extraocular Movement       Right Left    Full Full         Neuro/Psych     Oriented x3: Yes   Mood/Affect: Normal         Dilation     Right eye: 1.0% Mydriacyl, 2.5% Phenylephrine @ 11:11 AM           Slit Lamp and Fundus Exam     External Exam       Right Left   External Normal Normal         Slit Lamp Exam       Right Left   Lids/Lashes Normal Normal   Conjunctiva/Sclera White and quiet White and quiet   Cornea Clear Clear   Anterior Chamber Deep and quiet Deep and quiet   Iris Round and reactive Round and reactive   Lens Posterior chamber intraocular lens Posterior chamber intraocular lens   Anterior Vitreous Normal Normal  Fundus Exam       Right Left    Posterior Vitreous Normal    Disc Normal    C/D Ratio 0.35 0.35   Macula Advanced age related macular degeneration, Geographic atrophy, Hard drusen, no exudates, no clinical Subretinal neovascular membrane, no hemorrhage, Retinal pigment epithelial mottling, Retinal pigment epithelial atrophy, small hemorrhage on nonfoveal side of atrophy, temporally but not larger    Vessels Normal    Periphery Normal             IMAGING AND PROCEDURES  Imaging and Procedures for 08/06/21  Intravitreal Injection, Pharmacologic Agent - OD - Right Eye       Time Out 08/06/2021. 11:33 AM. Confirmed correct patient, procedure, site, and patient consented.   Anesthesia Topical anesthesia was used. Anesthetic medications included Lidocaine 4%.   Procedure Preparation included 5% betadine to ocular surface, 10% betadine to eyelids, Tobramycin 0.3%. A 30 gauge needle was used.   Injection: 2.5 mg bevacizumab 2.5 MG/0.1ML   Route: Intravitreal, Site: Right Eye   NDC: [id number], Lot: [id number]   Post-op Post injection exam found visual acuity of at least counting fingers. The patient tolerated the procedure well. There were no complications. The patient received written and verbal post procedure care education. Post injection medications included ocuflox.      OCT, Retina - OU - Both Eyes       Right Eye Quality was good. Scan locations included subfoveal. Central Foveal Thickness: 251. Progression has improved. Findings include abnormal foveal contour, outer retinal atrophy, inner retinal atrophy.   Left Eye Quality was good. Scan locations included subfoveal. Central Foveal Thickness: 299. Progression has been stable. Findings include epiretinal membrane, outer retinal atrophy, inner retinal atrophy.   Notes OS with minor epiretinal membrane nasal aspect of fovea yet with atrophy no or intervention, with significant subfoveal geographic atrophy and scarring from prior CNVM And dry AMD  a  OD, no sign of recurrence of CNVM 7-week interval today, stable overall no signs of intraretinal fluid or subretinal fluid             ASSESSMENT/PLAN:  Advanced nonexudative age-related macular degeneration of left eye with subfoveal involvement Central geographic atrophy limits acuity  History of vitrectomy Stabilized and improved perimacular vision with vitrectomy release of tractional detachment in the past  Exudative age-related macular degeneration of right eye with active choroidal neovascularization (HCC) Chronic active and recurrent CNVM in the past, currently at 7-week follow-up.  We will repeat injection today and examination again in 7 weeks     ICD-10-CM   1. Exudative age-related macular degeneration of right eye with active choroidal neovascularization (HCC)  H35.3211 Intravitreal Injection, Pharmacologic Agent - OD - Right Eye    OCT, Retina - OU - Both Eyes    bevacizumab (AVASTIN) SOSY 2.5 mg    2. Advanced nonexudative age-related macular degeneration of left eye with subfoveal involvement  H35.3124     3. History of vitrectomy  Z98.890       1.  OD, doing great with preserved acuity.  We will follow-up again in 7 weeks after repeat injection Avastin today to maintain with a history  chronic and recurrent CNVM  2.  OS, stable acuity, with atrophy centrally  3.  Ophthalmic Meds Ordered this visit:  Meds ordered this encounter  Medications   bevacizumab (AVASTIN) SOSY 2.5 mg       Return in about 7 weeks (around 09/24/2021) for DILATE OU, AVASTIN OCT, OD, COLOR FP.  There are no Patient Instructions on file for this visit.   Explained the diagnoses, plan, and follow up with the patient and they expressed understanding.  Patient expressed understanding of the importance of proper follow up care.   Clent Demark Dalon Reichart M.D. Diseases & Surgery of the Retina and Vitreous [facility] [place] 08/06/21     Abbreviations: M myopia  (nearsighted); A astigmatism; H hyperopia (farsighted); P presbyopia; Mrx spectacle prescription;  CTL contact lenses; OD right eye; OS left eye; OU both eyes  XT exotropia; ET esotropia; PEK punctate epithelial keratitis; PEE punctate epithelial erosions; DES dry eye syndrome; MGD meibomian gland dysfunction; ATs artificial tears; PFAT's preservative free artificial tears; [place] nuclear sclerotic cataract; PSC posterior subcapsular cataract; ERM epi-retinal membrane; PVD posterior vitreous detachment; RD retinal detachment; DM diabetes mellitus; DR diabetic retinopathy; NPDR non-proliferative diabetic retinopathy; PDR proliferative diabetic retinopathy; CSME clinically significant macular edema; DME diabetic macular edema; dbh dot blot hemorrhages; CWS cotton wool spot; POAG primary open angle glaucoma; C/D cup-to-disc ratio; HVF humphrey visual field; GVF goldmann visual field; OCT optical coherence tomography; IOP intraocular pressure; BRVO Branch retinal vein occlusion; CRVO central retinal vein occlusion; CRAO central retinal artery occlusion; BRAO branch retinal artery occlusion; RT retinal tear; SB scleral buckle; PPV pars plana vitrectomy; VH Vitreous hemorrhage; PRP panretinal laser photocoagulation; IVK intravitreal kenalog; VMT vitreomacular traction; MH Macular hole;  NVD neovascularization of the disc; NVE neovascularization elsewhere; AREDS age related eye disease study; ARMD age related macular degeneration; POAG primary open angle glaucoma; EBMD epithelial/anterior basement membrane dystrophy; ACIOL anterior chamber intraocular lens; IOL intraocular lens; PCIOL posterior chamber intraocular lens; Phaco/IOL phacoemulsification with intraocular lens placement; [place] photorefractive keratectomy; LASIK laser assisted in situ keratomileusis; HTN hypertension; DM diabetes mellitus; COPD chronic obstructive pulmonary disease

## 2021-08-15 NOTE — Progress Notes (Unsigned)
RSW:NIOEVO-JJ hypertension and atrial fibrillation. Also history of TIA. Nuclear study March 2017 showed ejection fraction 62% and no ischemia.  Carotid Dopplers May 2020 showed 1 to 39% bilateral stenosis. Question episodes of syncope at previous ov. Monitor 4/21 showed sinus with PACs, PVCs brief PAT but no pauses.  Found to be in atrial fibrillation at last office visit May 30, 2021.  Apixaban was initiated with plans for cardioversion if atrial fibrillation persisted as he did complain of increased fatigue.  Echocardiogram repeated January 2023 and showed normal LV function, mild left ventricular hypertrophy, mild right ventricular enlargement, mild left atrial enlargement, mild to moderate tricuspid regurgitation, mildly dilated ascending aorta, moderate aortic stenosis with mean gradient 12 mmHg, aortic valve area 1.5 cm and dimensionless index 0.3.  TEE February 2023 showed no left atrial appendage thrombus, ejection fraction 55 to 60%, mild biatrial enlargement, mild mitral regurgitation and moderate aortic stenosis.  Patient had successful cardioversion to normal sinus rhythm.  Since last seen,   Current Outpatient Medications  Medication Sig Dispense Refill   apixaban (ELIQUIS) 5 MG TABS tablet Take 1 tablet (5 mg total) by mouth 2 (two) times daily. 180 tablet 1   Bevacizumab (AVASTIN IV) Place 1 Syringe into the right eye every 8 (eight) weeks.     famotidine (PEPCID) 20 MG tablet Take 1 tablet (20 mg total) by mouth 2 (two) times daily. (Patient taking differently: Take 20 mg by mouth in the morning.) 180 tablet 3   glucose blood (ONE TOUCH ULTRA TEST) test strip Use as directed once daily to check blood sugar.  Diagnosis code E11.42 100 each 11   latanoprost (XALATAN) 0.005 % ophthalmic solution Place 1 drop into both eyes at bedtime. 2.5 mL 12   levothyroxine (SYNTHROID) 125 MCG tablet TAKE 1 TABLET EVERY DAY BEFORE BREAKFAST 90 tablet 3   metFORMIN (GLUCOPHAGE) 1000 MG  tablet TAKE 1 TABLET EVERY DAY 90 tablet 0   metoprolol succinate (TOPROL-XL) 25 MG 24 hr tablet Take 1 tablet (25 mg total) by mouth every morning. 90 tablet 3   Multiple Vitamins-Minerals (ICAPS AREDS 2 PO) Take 2 capsules by mouth daily.     tiZANidine (ZANAFLEX) 4 MG tablet TAKE 1 TABLET (4 MG TOTAL) BY MOUTH EVERY 6 (SIX) HOURS AS NEEDED FOR MUSCLE SPASMS. (Patient not taking: Reported on 07/24/2021) 360 tablet 3   No current facility-administered medications for this visit.     Past Medical History:  Diagnosis Date   Acute blood loss anemia 05/15/2011   post surgical   Altered mental status 05/15/2011   Anxiety    B12 deficiency 09/23/2016   Blood transfusion without reported diagnosis    Cancer ([place])    basal and squamous skin carcinoma    Cataract    Chronic kidney disease    hx of uti due to catheter hx of prostatitis, BPH -    Depression    Diabetes mellitus    GERD (gastroesophageal reflux disease)    Glaucoma 01/04/2013   Heart murmur    History of nuclear stress test    Myoview 3/17: EF 62%, no ischemia or scar, low risk   Hx of transient ischemic attack (TIA) 01/04/2013   Hx: UTI (urinary tract infection)    Hyperlipidemia    Hypertension    Hypokalemia 05/15/2011   Hypothyroidism    Insomnia    Macular degeneration    Neuropathy    OA (osteoarthritis)    Pneumonia    PVC's (  premature ventricular contractions)    Renal mass    Stroke ([place])    mid 1990s     Past Surgical History:  Procedure Laterality Date   APPENDECTOMY     CARDIOVERSION N/A 07/30/2021   Procedure: CARDIOVERSION;  Surgeon: Josue Hector, MD;  Location[facility] ENDOSCOPY;  Service: Cardiovascular;  Laterality: N/A;   CATARACT EXTRACTION W/ INTRAOCULAR LENS  IMPLANT, BILATERAL     INGUINAL HERNIA REPAIR     X3   LUMBAR LAMINECTOMY/DECOMPRESSION MICRODISCECTOMY N/A 02/12/2017   Procedure: Lumbar Three-Four, Lumbar Four-Five, Lumbar Five-Sacral One Laminectomy and Foraminotomy;  Surgeon: Earnie Larsson, MD;  Location: [place];  Service: Neurosurgery;  Laterality: N/A;   OTHER SURGICAL HISTORY     microwave procedure for prostate - 09/12    PROSTATE ABLATION     microwave   REPLACEMENT TOTAL KNEE     THYROIDECTOMY     TONSILLECTOMY AND ADENOIDECTOMY     TOTAL KNEE ARTHROPLASTY  05/11/2011   Procedure: TOTAL KNEE ARTHROPLASTY;  Surgeon: Gearlean Alf;  Location: [facility] ORS;  Service: Orthopedics;  Laterality: Right;   US ECHOCARDIOGRAPHY  11/10/2006   EF 55-60%   US ECHOCARDIOGRAPHY  09/20/2002   EF 65-70%    Social History   Socioeconomic History   Marital status: Married    Spouse name: Not on file   Number of children: 3   Years of education: Not on file   Highest education level: Not on file  Occupational History   Occupation: retired    Fish farm manager: RETIRED    Comment: [employer]  Tobacco Use   Smoking status: Never   Smokeless tobacco: Never  Vaping Use   Vaping Use: Never used  Substance and Sexual Activity   Alcohol use: Yes    Alcohol/week: 7.0 standard drinks    Types: 7 Shots of liquor per week    Comment: 2 drinks most days   Drug use: No   Sexual activity: Never    Birth control/protection: None  Other Topics Concern   Not on file  Social History Narrative   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, 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[employer]      Home Situation: living with wife      Spiritual Beliefs: episcopalian      Lifestyle: walks daily, working on diet            Social Determinants of Radio broadcast assistant Strain: Low Risk    Difficulty of Paying Living Expenses: Not hard at all  Food Insecurity: No Food Insecurity   Worried About Charity fundraiser in the Last Year: Never true   Arboriculturist in the Last Year: Never true  Transportation Needs: No Transportation Needs   Lack of Transportation (Medical): No   Lack of Transportation (Non-Medical): No  Physical Activity: Inactive   Days of Exercise per Week: 0 days   Minutes of Exercise  per Session: 0 min  Stress: No Stress Concern Present   Feeling of Stress : Not at all  Social Connections: Not on file  Intimate Partner Violence: Not on file    Family History  Problem Relation Age of Onset   Hypertension Mother    Arthritis Mother    Diabetes Mother    Hypertension Father    Stroke Father    Kidney disease Father    Hypertension Brother    Diabetes Brother     ROS: no fevers or chills, productive  cough, hemoptysis, dysphasia, odynophagia, melena, hematochezia, dysuria, hematuria, rash, seizure activity, orthopnea, PND, pedal edema, claudication. Remaining systems are negative.  Physical Exam: Well-developed well-nourished in no acute distress.  Skin is warm and dry.  HEENT is normal.  Neck is supple.  Chest is clear to auscultation with normal expansion.  Cardiovascular exam is regular rate and rhythm.  Abdominal exam nontender or distended. No masses palpated. Extremities show no edema. neuro grossly intact  ECG- personally reviewed  A/P  1 paroxysmal atrial fibrillation-  2 aortic stenosis-moderate on last echocardiogram.  Plan follow-up studies in the future.  He understands he may require TAVR in the future.  3 hypertension-patient's blood pressure is controlled.  Continue present medications.  4 history of TIA-continue apixaban.  Given history of atrial fibrillation he will need long term anticoagulation.  5 hyperlipidemia-Per primary care.  Kirk Ruths, MD

## 2021-08-26 ENCOUNTER — Other Ambulatory Visit: Payer: Self-pay

## 2021-08-26 ENCOUNTER — Ambulatory Visit (INDEPENDENT_AMBULATORY_CARE_PROVIDER_SITE_OTHER): Payer: Medicare Other | Admitting: Internal Medicine

## 2021-08-26 ENCOUNTER — Encounter: Payer: Self-pay | Admitting: Internal Medicine

## 2021-08-26 VITALS — BP 142/70 | HR 96 | Ht 73.0 in | Wt 179.8 lb

## 2021-08-26 DIAGNOSIS — F32A Depression, unspecified: Secondary | ICD-10-CM

## 2021-08-26 DIAGNOSIS — R413 Other amnesia: Secondary | ICD-10-CM | POA: Diagnosis not present

## 2021-08-26 DIAGNOSIS — E1142 Type 2 diabetes mellitus with diabetic polyneuropathy: Secondary | ICD-10-CM

## 2021-08-26 DIAGNOSIS — F419 Anxiety disorder, unspecified: Secondary | ICD-10-CM | POA: Diagnosis not present

## 2021-08-26 DIAGNOSIS — E538 Deficiency of other specified B group vitamins: Secondary | ICD-10-CM

## 2021-08-26 DIAGNOSIS — M109 Gout, unspecified: Secondary | ICD-10-CM

## 2021-08-26 DIAGNOSIS — R443 Hallucinations, unspecified: Secondary | ICD-10-CM | POA: Diagnosis not present

## 2021-08-26 DIAGNOSIS — E559 Vitamin D deficiency, unspecified: Secondary | ICD-10-CM | POA: Diagnosis not present

## 2021-08-26 MED ORDER — TRAMADOL HCL 50 MG PO TABS: 50.0000 mg | ORAL_TABLET | Freq: Four times a day (QID) | ORAL | 0 refills | Status: DC | PRN

## 2021-08-26 MED ORDER — PREDNISONE 10 MG PO TABS: ORAL_TABLET | ORAL | 0 refills | Status: DC

## 2021-08-26 MED ORDER — QUETIAPINE FUMARATE 25 MG PO TABS: 25.0000 mg | ORAL_TABLET | Freq: Every day | ORAL | 5 refills | Status: DC

## 2021-08-26 NOTE — Patient Instructions (Signed)
Please take all new medication as prescribed - the prednisone for probable gout, and the tramadol for pain ? ?Please take all new medication as prescribed - the very low dose seroquel for hallucinations ? ?You will be contacted regarding the referral for: Neuropsychology evaluation ? ?Please continue all other medications as before, and refills have been done if requested. ? ?Please have the pharmacy call with any other refills you may need. ? ?Please continue your efforts at being more active, low cholesterol diet, and weight control. ? ?Please keep your appointments with your specialists as you may have planned ? ?Please go to the LAB at the blood drawing area for the tests to be done - at the [facility] lab at your convenience ? ?You will be contacted by phone if any changes need to be made immediately.  Otherwise, you will receive a letter about your results with an explanation, but please check with MyChart first. ? ?Please remember to sign up for MyChart if you have not done so, as this will be important to you in the future with finding out test results, communicating by private email, and scheduling acute appointments online when needed. ? ?Please make an Appointment to return in 3 months, or sooner if needed ?

## 2021-08-26 NOTE — Progress Notes (Signed)
Patient ID: Stephen Wilkinson, male   DOB: 1928/10/08, 88 y.o.   MRN: [id number]        Chief Complaint: follow up right foot pain, hallucinations, worsening memory loss and anxiety       HPI:  Stephen Wilkinson is a 88 y.o. male here with c/o distal right foot pain and sweling without trauma or misstep or fall or fever or prior hx of same;  Has had worsening memory loss in the past 6 mo with intermittent hallucinations of which he is aware but dont really bother him, just makes him more confused sometimes; Denies worsening depressive symptoms, suicidal ideation, or panic; has ongoing anxiety,Pt denies chest pain, increased sob or doe, wheezing, orthopnea, PND, increased LE swelling, palpitations, dizziness or syncope.   Pt denies polydipsia, polyuria, or new focal neuro s/s.    Pt denies fever, wt loss, night sweats, loss of appetite, or other constitutional symptoms         Wt Readings from Last 3 Encounters:  08/28/21 177 lb 12.8 oz (80.6 kg)  08/26/21 179 lb 12.8 oz (81.6 kg)  07/30/21 175 lb (79.4 kg)   BP Readings from Last 3 Encounters:  08/28/21 116/76  08/26/21 (!) 142/70  07/30/21 123/78         Past Medical History:  Diagnosis Date   Acute blood loss anemia 05/15/2011   post surgical   Altered mental status 05/15/2011   Anxiety    B12 deficiency 09/23/2016   Blood transfusion without reported diagnosis    Cancer ([facility])    basal and squamous skin carcinoma    Cataract    Chronic kidney disease    hx of uti due to catheter hx of prostatitis, BPH -    Depression    Diabetes mellitus    GERD (gastroesophageal reflux disease)    Glaucoma 01/04/2013   Heart murmur    History of nuclear stress test    Myoview 3/17: EF 62%, no ischemia or scar, low risk   Hx of transient ischemic attack (TIA) 01/04/2013   Hx: UTI (urinary tract infection)    Hyperlipidemia    Hypertension    Hypokalemia 05/15/2011   Hypothyroidism    Insomnia    Macular degeneration    Neuropathy    OA (osteoarthritis)     Pneumonia    PVC's (premature ventricular contractions)    Renal mass    Stroke ([facility])    mid 1990s    Past Surgical History:  Procedure Laterality Date   APPENDECTOMY     CARDIOVERSION N/A 07/30/2021   Procedure: CARDIOVERSION;  Surgeon: Wendall Stade, MD;  Location: [facility] ENDOSCOPY;  Service: Cardiovascular;  Laterality: N/A;   CATARACT EXTRACTION W/ INTRAOCULAR LENS  IMPLANT, BILATERAL     INGUINAL HERNIA REPAIR     X3   LUMBAR LAMINECTOMY/DECOMPRESSION MICRODISCECTOMY N/A 02/12/2017   Procedure: Lumbar Three-Four, Lumbar Four-Five, Lumbar Five-Sacral One Laminectomy and Foraminotomy;  Surgeon: Julio Sicks, MD;  Location: [facility] OR;  Service: Neurosurgery;  Laterality: N/A;   OTHER SURGICAL HISTORY     microwave procedure for prostate - 09/12    PROSTATE ABLATION     microwave   REPLACEMENT TOTAL KNEE     THYROIDECTOMY     TONSILLECTOMY AND ADENOIDECTOMY     TOTAL KNEE ARTHROPLASTY  05/11/2011   Procedure: TOTAL KNEE ARTHROPLASTY;  Surgeon: Loanne Drilling;  Location: [facility];  Service: Orthopedics;  Laterality: Right;   US ECHOCARDIOGRAPHY  11/10/2006   EF 55-60%  US ECHOCARDIOGRAPHY  09/20/2002   EF 65-70%    reports that he has never smoked. He has never used smokeless tobacco. He reports current alcohol use of about 7.0 standard drinks per week. He reports that he does not use drugs. family history includes Arthritis in his mother; Diabetes in his brother and mother; Hypertension in his brother, father, and mother; Kidney disease in his father; Stroke in his father. No Known Allergies Current Outpatient Medications on File Prior to Visit  Medication Sig Dispense Refill   apixaban (ELIQUIS) 5 MG TABS tablet Take 1 tablet (5 mg total) by mouth 2 (two) times daily. 180 tablet 1   Bevacizumab (AVASTIN IV) Place 1 Syringe into the right eye every 8 (eight) weeks.     famotidine (PEPCID) 20 MG tablet Take 1 tablet (20 mg total) by mouth 2 (two) times daily. (Patient taking differently:  Take 20 mg by mouth in the morning.) 180 tablet 3   glucose blood (ONE TOUCH ULTRA TEST) test strip Use as directed once daily to check blood sugar.  Diagnosis code E11.42 100 each 11   latanoprost (XALATAN) 0.005 % ophthalmic solution Place 1 drop into both eyes at bedtime. 2.5 mL 12   levothyroxine (SYNTHROID) 125 MCG tablet TAKE 1 TABLET EVERY DAY BEFORE BREAKFAST 90 tablet 3   metFORMIN (GLUCOPHAGE) 1000 MG tablet TAKE 1 TABLET EVERY DAY 90 tablet 0   metoprolol succinate (TOPROL-XL) 25 MG 24 hr tablet Take 1 tablet (25 mg total) by mouth every morning. 90 tablet 3   Multiple Vitamins-Minerals (ICAPS AREDS 2 PO) Take 2 capsules by mouth daily.     tiZANidine (ZANAFLEX) 4 MG tablet TAKE 1 TABLET (4 MG TOTAL) BY MOUTH EVERY 6 (SIX) HOURS AS NEEDED FOR MUSCLE SPASMS. 360 tablet 3   No current facility-administered medications on file prior to visit.        ROS:  All others reviewed and negative.  Objective        PE:  BP (!) 142/70 (BP Location: Right Arm, Patient Position: Sitting, Cuff Size: Normal)   Pulse 96   Ht 6\' 1"  (1.854 m)   Wt 179 lb 12.8 oz (81.6 kg)   SpO2 96%   BMI 23.72 kg/m                 Constitutional: Pt appears in NAD               HENT: Head: NCAT.                Right Ear: External ear normal.                 Left Ear: External ear normal.                Eyes: . Pupils are equal, round, and reactive to light. Conjunctivae and EOM are normal               Nose: without d/c or deformity               Neck: Neck supple. Gross normal ROM               Cardiovascular: Normal rate and regular rhythm.                 Pulmonary/Chest: Effort normal and breath sounds without rales or wheezing.                Abd:  Soft, NT, ND, + BS,  no organomegaly               Neurological: Pt is alert. At baseline orientation, motor grossly intact               Skin: Skin is warm. No rashes, no other new lesions, LE edema - none except for distal right foot all MTPs with mild  swelling and tender - no erythema               Psychiatric: Pt behavior is normal without agitation   Micro: none  Cardiac tracings I have personally interpreted today:  none  Pertinent Radiological findings (summarize): none   Lab Results  Component Value Date   WBC 7.7 08/27/2021   HGB 14.6 08/27/2021   HCT 43.2 08/27/2021   PLT 214.0 08/27/2021   GLUCOSE 177 (H) 08/27/2021   CHOL 177 08/27/2021   TRIG 75.0 08/27/2021   HDL 61.10 08/27/2021   LDLCALC 101 (H) 08/27/2021   ALT 9 08/27/2021   AST 14 08/27/2021   NA 138 08/27/2021   K 4.2 08/27/2021   CL 104 08/27/2021   CREATININE 1.26 08/27/2021   BUN 18 08/27/2021   CO2 25 08/27/2021   TSH 0.67 08/27/2021   INR 2.16 (H) 05/15/2011   HGBA1C 7.4 (H) 08/27/2021   MICROALBUR 0.8 10/09/2020   Assessment/Plan:  Omair Chhabra is a 88 y.o. White or Caucasian [1] male with  has a past medical history of Acute blood loss anemia (05/15/2011), Altered mental status (05/15/2011), Anxiety, B12 deficiency (09/23/2016), Blood transfusion without reported diagnosis, Cancer ([facility]), Cataract, Chronic kidney disease, Depression, Diabetes mellitus, GERD (gastroesophageal reflux disease), Glaucoma (01/04/2013), Heart murmur, History of nuclear stress test, transient ischemic attack (TIA) (01/04/2013), UTI (urinary tract infection), Hyperlipidemia, Hypertension, Hypokalemia (05/15/2011), Hypothyroidism, Insomnia, Macular degeneration, Neuropathy, OA (osteoarthritis), Pneumonia, PVC's (premature ventricular contractions), Renal mass, and Stroke ([facility]).  Vitamin D deficiency Last vitamin D Lab Results  Component Value Date   VD25OH 72.85 08/27/2021   Stable, cont oral replacement   Memory loss Mild worsening, for neuropsych referral for further consdieration  Hallucinations Mild to mod, for seroquel 25 bid,  to f/u any worsening symptoms or concerns  Acute gouty arthritis Mild to mod, for prednisone asd, to f/u any worsening symptoms or  concerns  Vitamin B 12 deficiency Lab Results  Component Value Date   VITAMINB12 473 08/27/2021   Stable, cont oral replacement - b12 1000 mcg qd   Anxiety and depression Chronic peristent, for neuropsych referral,  to f/u any worsening symptoms or concerns  DM type 2 with diabetic peripheral neuropathy Lab Results  Component Value Date   HGBA1C 7.4 (H) 08/27/2021   Mild uncontrolled pt to continue current medical treatment metformin as declines change  Followup: Return in about 3 months (around 11/26/2021).  Oliver Barre, MD 08/29/2021 10:48 PM [facility]

## 2021-08-27 ENCOUNTER — Other Ambulatory Visit (INDEPENDENT_AMBULATORY_CARE_PROVIDER_SITE_OTHER): Payer: Medicare Other

## 2021-08-27 DIAGNOSIS — E538 Deficiency of other specified B group vitamins: Secondary | ICD-10-CM

## 2021-08-27 DIAGNOSIS — E1142 Type 2 diabetes mellitus with diabetic polyneuropathy: Secondary | ICD-10-CM

## 2021-08-27 DIAGNOSIS — M109 Gout, unspecified: Secondary | ICD-10-CM

## 2021-08-27 DIAGNOSIS — E559 Vitamin D deficiency, unspecified: Secondary | ICD-10-CM

## 2021-08-27 LAB — CBC WITH DIFFERENTIAL/PLATELET
Basophils Absolute: 0 10*3/uL (ref 0.0–0.1)
Basophils Relative: 0.6 % (ref 0.0–3.0)
Eosinophils Absolute: 0.1 10*3/uL (ref 0.0–0.7)
Eosinophils Relative: 1.3 % (ref 0.0–5.0)
HCT: 43.2 % (ref 39.0–52.0)
Hemoglobin: 14.6 g/dL (ref 13.0–17.0)
Lymphocytes Relative: 15.4 % (ref 12.0–46.0)
Lymphs Abs: 1.2 10*3/uL (ref 0.7–4.0)
MCHC: 33.8 g/dL (ref 30.0–36.0)
MCV: 95.5 fl (ref 78.0–100.0)
Monocytes Absolute: 0.7 10*3/uL (ref 0.1–1.0)
Monocytes Relative: 8.5 % (ref 3.0–12.0)
Neutro Abs: 5.7 10*3/uL (ref 1.4–7.7)
Neutrophils Relative %: 74.2 % (ref 43.0–77.0)
Platelets: 214 10*3/uL (ref 150.0–400.0)
RBC: 4.52 Mil/uL (ref 4.22–5.81)
RDW: 13.7 % (ref 11.5–15.5)
WBC: 7.7 10*3/uL (ref 4.0–10.5)

## 2021-08-27 LAB — HEPATIC FUNCTION PANEL
ALT: 9 U/L (ref 0–53)
AST: 14 U/L (ref 0–37)
Albumin: 4.5 g/dL (ref 3.5–5.2)
Alkaline Phosphatase: 69 U/L (ref 39–117)
Bilirubin, Direct: 0.1 mg/dL (ref 0.0–0.3)
Total Bilirubin: 0.8 mg/dL (ref 0.2–1.2)
Total Protein: 6.9 g/dL (ref 6.0–8.3)

## 2021-08-27 LAB — URINALYSIS, ROUTINE W REFLEX MICROSCOPIC
Bilirubin Urine: NEGATIVE
Hgb urine dipstick: NEGATIVE
Leukocytes,Ua: NEGATIVE
Nitrite: NEGATIVE
Specific Gravity, Urine: 1.025 (ref 1.000–1.030)
Total Protein, Urine: NEGATIVE
Urine Glucose: NEGATIVE
Urobilinogen, UA: 0.2 (ref 0.0–1.0)
pH: 5.5 (ref 5.0–8.0)

## 2021-08-27 LAB — BASIC METABOLIC PANEL
BUN: 18 mg/dL (ref 6–23)
CO2: 25 mEq/L (ref 19–32)
Calcium: 9.4 mg/dL (ref 8.4–10.5)
Chloride: 104 mEq/L (ref 96–112)
Creatinine, Ser: 1.26 mg/dL (ref 0.40–1.50)
GFR: 50.01 mL/min — ABNORMAL LOW (ref >60.00)
Glucose, Bld: 177 mg/dL — ABNORMAL HIGH (ref 70–99)
Potassium: 4.2 mEq/L (ref 3.5–5.1)
Sodium: 138 mEq/L (ref 135–145)

## 2021-08-27 LAB — LIPID PANEL
Cholesterol: 177 mg/dL (ref 0–200)
HDL: 61.1 mg/dL (ref >39.00)
LDL Cholesterol: 101 mg/dL — ABNORMAL HIGH (ref 0–99)
NonHDL: 116.26
Total CHOL/HDL Ratio: 3
Triglycerides: 75 mg/dL (ref 0.0–149.0)
VLDL: 15 mg/dL (ref 0.0–40.0)

## 2021-08-27 LAB — TSH: TSH: 0.67 u[IU]/mL (ref 0.35–5.50)

## 2021-08-27 LAB — VITAMIN D 25 HYDROXY (VIT D DEFICIENCY, FRACTURES): VITD: 72.85 ng/mL (ref 30.00–100.00)

## 2021-08-27 LAB — VITAMIN B12: Vitamin B-12: 473 pg/mL (ref 211–911)

## 2021-08-27 LAB — HEMOGLOBIN A1C: Hgb A1c MFr Bld: 7.4 % — ABNORMAL HIGH (ref 4.6–6.5)

## 2021-08-27 LAB — URIC ACID: Uric Acid, Serum: 6.6 mg/dL (ref 4.0–7.8)

## 2021-08-28 ENCOUNTER — Other Ambulatory Visit: Payer: Self-pay | Admitting: *Deleted

## 2021-08-28 ENCOUNTER — Ambulatory Visit (INDEPENDENT_AMBULATORY_CARE_PROVIDER_SITE_OTHER): Payer: Medicare Other | Admitting: Cardiology

## 2021-08-28 ENCOUNTER — Other Ambulatory Visit: Payer: Self-pay

## 2021-08-28 ENCOUNTER — Encounter: Payer: Self-pay | Admitting: Cardiology

## 2021-08-28 VITALS — BP 116/76 | HR 91 | Ht 73.0 in | Wt 177.8 lb

## 2021-08-28 DIAGNOSIS — I350 Nonrheumatic aortic (valve) stenosis: Secondary | ICD-10-CM | POA: Diagnosis not present

## 2021-08-28 DIAGNOSIS — I10 Essential (primary) hypertension: Secondary | ICD-10-CM | POA: Diagnosis not present

## 2021-08-28 DIAGNOSIS — E7800 Pure hypercholesterolemia, unspecified: Secondary | ICD-10-CM | POA: Diagnosis not present

## 2021-08-28 DIAGNOSIS — I4819 Other persistent atrial fibrillation: Secondary | ICD-10-CM | POA: Diagnosis not present

## 2021-08-28 MED ORDER — AMIODARONE HCL 200 MG PO TABS: 200.0000 mg | ORAL_TABLET | Freq: Every day | ORAL | 3 refills | Status: DC

## 2021-08-28 MED ORDER — AMIODARONE HCL 200 MG PO TABS: 200.0000 mg | ORAL_TABLET | Freq: Two times a day (BID) | ORAL | 0 refills | Status: DC

## 2021-08-28 NOTE — Patient Instructions (Signed)
Medication Instructions:  ? ?START AMIODARONE 200 MG ONE TABLET TWICE DAILY X 2 WEEKS THEN TAKE  ?AMIODARONE 200 MG ONCE DAILY ? ?*If you need a refill on your cardiac medications before your next appointment, please call your pharmacy* ? ? ?Testing/Procedures: ? ?You are scheduled for a  Cardioversion on 10/08/21 with Dr. Stanford Breed.  Please arrive at the [facility] (Main Entrance A) at [facility]: [address] [place], [place] [address] at 10 am. (1 hour prior to procedure unless lab work is needed; if lab work is needed arrive 1.5 hours ahead) ? ?DIET: Nothing to eat or drink after midnight except a sip of water with medications (see medication instructions below) ? ?FYI: For your safety, and to allow [place] to monitor your vital signs accurately during the surgery/procedure we request that   ?if you have artificial nails, gel coating, SNS etc. Please have those removed prior to your surgery/procedure. Not having the nail coverings /polish removed may result in cancellation or delay of your surgery/procedure. ? ? ?Medication Instructions: ?DO NOT TAKE METOPROLOL THE MORNING OF THE PROCEDURE ? ?Continue your anticoagulant:ELIQUIS ? ?You must have a responsible person to drive you home and stay in the waiting area during your procedure. Failure to do so could result in cancellation. ? ?Interior and spatial designer cards. ? ?*Special Note: Every effort is made to have your procedure done on time. Occasionally there are emergencies that occur at the hospital that may cause delays. Please be patient if a delay does occur.   ? ? ?Follow-Up: ?At [facility], you and your health needs are our priority.  As part of our continuing mission to provide you with exceptional heart care, we have created designated Provider Care Teams.  These Care Teams include your primary Cardiologist (physician) and Advanced Practice Providers (APPs -  Physician Assistants and Nurse Practitioners) who all work together to provide you with the  care you need, when you need it. ? ?We recommend signing up for the patient portal called "MyChart".  Sign up information is provided on this After Visit Summary.  MyChart is used to connect with patients for Virtual Visits (Telemedicine).  Patients are able to view lab/test results, encounter notes, upcoming appointments, etc.  Non-urgent messages can be sent to your provider as well.   ?To learn more about what you can do with MyChart, go to [email or web address].   ? ?Your next appointment:   ?6-8 week(s) ? ?The format for your next appointment:   ?In Person ? ?Provider:   ?Kirk Ruths, MD   ? ? ? ?

## 2021-08-29 ENCOUNTER — Encounter: Payer: Self-pay | Admitting: Internal Medicine

## 2021-08-29 DIAGNOSIS — R413 Other amnesia: Secondary | ICD-10-CM | POA: Insufficient documentation

## 2021-08-29 DIAGNOSIS — M109 Gout, unspecified: Secondary | ICD-10-CM | POA: Insufficient documentation

## 2021-08-29 DIAGNOSIS — E559 Vitamin D deficiency, unspecified: Secondary | ICD-10-CM | POA: Insufficient documentation

## 2021-08-29 DIAGNOSIS — R443 Hallucinations, unspecified: Secondary | ICD-10-CM | POA: Insufficient documentation

## 2021-08-29 NOTE — Assessment & Plan Note (Signed)
Mild worsening, for neuropsych referral for further consdieration ?

## 2021-08-29 NOTE — Assessment & Plan Note (Signed)
Chronic peristent, for neuropsych referral,  to f/u any worsening symptoms or concerns ?

## 2021-08-29 NOTE — Assessment & Plan Note (Signed)
Last vitamin D ?Lab Results  ?Component Value Date  ? VD25OH 72.85 08/27/2021  ? ?Stable, cont oral replacement ? ?

## 2021-08-29 NOTE — Assessment & Plan Note (Signed)
Lab Results  ?Component Value Date  ? ZESPQZRA07 473 08/27/2021  ? ?Stable, cont oral replacement - b12 1000 mcg qd ? ?

## 2021-08-29 NOTE — Assessment & Plan Note (Signed)
Mild to mod, for prednisone asd, to f/u any worsening symptoms or concerns ?

## 2021-08-29 NOTE — Assessment & Plan Note (Signed)
Lab Results  ?Component Value Date  ? HGBA1C 7.4 (H) 08/27/2021  ? ?Mild uncontrolled pt to continue current medical treatment metformin as declines change ? ?

## 2021-08-29 NOTE — Assessment & Plan Note (Signed)
Mild to mod, for seroquel 25 bid,  to f/u any worsening symptoms or concerns ?

## 2021-09-01 ENCOUNTER — Telehealth: Payer: Self-pay | Admitting: Cardiology

## 2021-09-01 DIAGNOSIS — I4819 Other persistent atrial fibrillation: Secondary | ICD-10-CM

## 2021-09-01 MED ORDER — AMIODARONE HCL 200 MG PO TABS: 200.0000 mg | ORAL_TABLET | Freq: Every day | ORAL | 3 refills | Status: DC

## 2021-09-01 NOTE — Telephone Encounter (Signed)
Rx(s) sent to pharmacy electronically.  

## 2021-09-01 NOTE — Telephone Encounter (Signed)
?*  STAT* If patient is at the pharmacy, call can be transferred to refill team. ? ? ?1. Which medications need to be refilled? (please list name of each medication and dose if known) new prescription for Amiodarone 200 mg 1 tablet daily ? ?2. Which pharmacy/location (including street and city if local pharmacy) is medication to be sent to? [facility]  [address] [place], [place] ? ?3. Do they need a 30 day or 90 day supply? 90 days and refills ? ?

## 2021-09-11 ENCOUNTER — Telehealth: Payer: Self-pay | Admitting: Cardiology

## 2021-09-11 MED ORDER — APIXABAN 5 MG PO TABS: 5.0000 mg | ORAL_TABLET | Freq: Two times a day (BID) | ORAL | 3 refills | Status: DC

## 2021-09-11 NOTE — Telephone Encounter (Signed)
Spoke with pt, he will restart using the humidifier and saline nasal spray. He is aware to continue the eliquis. ?

## 2021-09-11 NOTE — Telephone Encounter (Signed)
Contacted patient, he states that he has had bleeding from his nose off and on since this morning. His wife was able to get it to stop, however when he went to eat lunch it started bleeding again. He was able to get it to stop, and he is not currently bleeding now. But he states anytime he uses his mouth to eat it begins to bleed again. Only out of his left nostril. He states when it does occur it is consistent dripping from his nose, he can pinch his nose together and get it to stop but it will come back. He is not sure what is causing this to occur. He would like recommendations on what to do. He has not picked his nose to cause the bleeding. ? ?I did advise patient we could send a message over to Dr.Crenshaw, and [facility]- however with upcoming cardioversion they would like for him to continue if possible, which he was aware of. I advised I would wait for their recommendations for this situation and call him back.  ? ?Patient verbalized understanding. ? ? ? ? ?

## 2021-09-11 NOTE — Telephone Encounter (Signed)
Pt c/o medication issue: ? ?1. Name of Medication:  ?apixaban (ELIQUIS) 5 MG TABS tablet ?amiodarone (PACERONE) 200 MG tablet ? ?2. How are you currently taking this medication (dosage and times per day)? as prescribed ? ?3. Are you having a reaction (difficulty breathing--STAT)? Yes ? ?4. What is your medication issue? Patient had a nose bleed from the left nostril start this morning after breakfast. He reports his wife who used to be a nurse was able to get it stopped this morning by holding it. After lunch his nose began to bleed again and is still currently bleeding now, only from the left nostril. Patient is unsure if this is from starting amiodarone, which he his taking two tablets daily of until tomorrow or his blood thinner. Please advise.   ?

## 2021-09-16 ENCOUNTER — Emergency Department (HOSPITAL_COMMUNITY)
Admission: EM | Admit: 2021-09-16 | Discharge: 2021-09-16 | Disposition: A | Discharge status: Home / Self Care | Payer: Medicare Other | Attending: Emergency Medicine | Admitting: Emergency Medicine

## 2021-09-16 ENCOUNTER — Other Ambulatory Visit: Payer: Self-pay

## 2021-09-16 ENCOUNTER — Encounter (HOSPITAL_COMMUNITY): Payer: Self-pay | Admitting: Emergency Medicine

## 2021-09-16 DIAGNOSIS — Z85828 Personal history of other malignant neoplasm of skin: Secondary | ICD-10-CM | POA: Diagnosis not present

## 2021-09-16 DIAGNOSIS — Z79899 Other long term (current) drug therapy: Secondary | ICD-10-CM | POA: Diagnosis not present

## 2021-09-16 DIAGNOSIS — R42 Dizziness and giddiness: Secondary | ICD-10-CM | POA: Insufficient documentation

## 2021-09-16 DIAGNOSIS — R5383 Other fatigue: Secondary | ICD-10-CM | POA: Diagnosis not present

## 2021-09-16 DIAGNOSIS — R531 Weakness: Secondary | ICD-10-CM | POA: Diagnosis not present

## 2021-09-16 DIAGNOSIS — I10 Essential (primary) hypertension: Secondary | ICD-10-CM | POA: Diagnosis not present

## 2021-09-16 DIAGNOSIS — R7989 Other specified abnormal findings of blood chemistry: Secondary | ICD-10-CM | POA: Insufficient documentation

## 2021-09-16 DIAGNOSIS — I129 Hypertensive chronic kidney disease with stage 1 through stage 4 chronic kidney disease, or unspecified chronic kidney disease: Secondary | ICD-10-CM | POA: Diagnosis not present

## 2021-09-16 DIAGNOSIS — Z7984 Long term (current) use of oral hypoglycemic drugs: Secondary | ICD-10-CM | POA: Diagnosis not present

## 2021-09-16 DIAGNOSIS — R040 Epistaxis: Secondary | ICD-10-CM | POA: Diagnosis not present

## 2021-09-16 DIAGNOSIS — I2510 Atherosclerotic heart disease of native coronary artery without angina pectoris: Secondary | ICD-10-CM | POA: Diagnosis not present

## 2021-09-16 DIAGNOSIS — R9431 Abnormal electrocardiogram [ECG] [EKG]: Secondary | ICD-10-CM | POA: Diagnosis not present

## 2021-09-16 DIAGNOSIS — E1122 Type 2 diabetes mellitus with diabetic chronic kidney disease: Secondary | ICD-10-CM | POA: Insufficient documentation

## 2021-09-16 DIAGNOSIS — I4891 Unspecified atrial fibrillation: Secondary | ICD-10-CM | POA: Insufficient documentation

## 2021-09-16 DIAGNOSIS — N189 Chronic kidney disease, unspecified: Secondary | ICD-10-CM | POA: Diagnosis not present

## 2021-09-16 DIAGNOSIS — E039 Hypothyroidism, unspecified: Secondary | ICD-10-CM | POA: Insufficient documentation

## 2021-09-16 DIAGNOSIS — Z7901 Long term (current) use of anticoagulants: Secondary | ICD-10-CM | POA: Diagnosis not present

## 2021-09-16 LAB — COMPREHENSIVE METABOLIC PANEL
ALT: 10 U/L (ref 0–44)
AST: 15 U/L (ref 15–41)
Albumin: 4.2 g/dL (ref 3.5–5.0)
Alkaline Phosphatase: 61 U/L (ref 38–126)
Anion gap: 9 (ref 5–15)
BUN: 21 mg/dL (ref 8–23)
CO2: 28 mmol/L (ref 22–32)
Calcium: 9.5 mg/dL (ref 8.9–10.3)
Chloride: 102 mmol/L (ref 98–111)
Creatinine, Ser: 1.52 mg/dL — ABNORMAL HIGH (ref 0.61–1.24)
GFR, Estimated: 43 mL/min — ABNORMAL LOW (ref >60)
Glucose, Bld: 152 mg/dL — ABNORMAL HIGH (ref 70–99)
Potassium: 4.4 mmol/L (ref 3.5–5.1)
Sodium: 139 mmol/L (ref 135–145)
Total Bilirubin: 0.7 mg/dL (ref 0.3–1.2)
Total Protein: 7 g/dL (ref 6.5–8.1)

## 2021-09-16 LAB — CBC WITH DIFFERENTIAL/PLATELET
Abs Immature Granulocytes: 0.04 10*3/uL (ref 0.00–0.07)
Basophils Absolute: 0 10*3/uL (ref 0.0–0.1)
Basophils Relative: 0 %
Eosinophils Absolute: 0.1 10*3/uL (ref 0.0–0.5)
Eosinophils Relative: 1 %
HCT: 46.2 % (ref 39.0–52.0)
Hemoglobin: 15.6 g/dL (ref 13.0–17.0)
Immature Granulocytes: 1 %
Lymphocytes Relative: 13 %
Lymphs Abs: 1.1 10*3/uL (ref 0.7–4.0)
MCH: 32.7 pg (ref 26.0–34.0)
MCHC: 33.8 g/dL (ref 30.0–36.0)
MCV: 96.9 fL (ref 80.0–100.0)
Monocytes Absolute: 0.6 10*3/uL (ref 0.1–1.0)
Monocytes Relative: 7 %
Neutro Abs: 6.5 10*3/uL (ref 1.7–7.7)
Neutrophils Relative %: 78 %
Platelets: 210 10*3/uL (ref 150–400)
RBC: 4.77 MIL/uL (ref 4.22–5.81)
RDW: 12.8 % (ref 11.5–15.5)
WBC: 8.4 10*3/uL (ref 4.0–10.5)
nRBC: 0 % (ref 0.0–0.2)

## 2021-09-16 LAB — PROTIME-INR
INR: 1.1 (ref 0.8–1.2)
Prothrombin Time: 14.6 seconds (ref 11.4–15.2)

## 2021-09-16 MED ORDER — OXYMETAZOLINE HCL 0.05 % NA SOLN
1.0000 | Freq: Once | NASAL | Status: AC
  Administered 2021-09-16: 1 via NASAL
  Filled 2021-09-16: qty 30

## 2021-09-16 NOTE — ED Triage Notes (Signed)
Pt arrives via [facility] from home with C/O of nose bleed. Per ems pt. Nose bleed started two days ago, but has worsened today.  ?Pt. Is taking Eloquis. Pt. C/o weakness and dizziness.  ? ?Vs:  ?150/90, 96%, 83P  ?

## 2021-09-16 NOTE — Consult Note (Signed)
Reason for Consult: Epistaxis ?Referring Physician: ER MD ? ?Stephen Wilkinson is an 88 y.o. male.  ?HPI: Stephen Wilkinson is a 66 year old anxious male who presents with left-sided nosebleed.  It began 2 days ago and then stopped only to restart last night.  He is being managed on Eliquis by cardiology.  The patient says, "my cardiologist made me promise him that I would not listen to any other physicians if they recommended that I stop this medication."  Currently, the patient has no bleeding but a large clot in his left naris.  From review of the chart, it appears that he is on his platelet inhibitor for management of chronic atrial fibrillation although he is also had a stroke in the past.  The ER attending spoke with his cardiologist who said that holding the Eliquis was acceptable.  I had a nice interaction with Stephen Wilkinson in the room lasting 25 minutes. ? ?Past Medical History:  ?Diagnosis Date  ? Acute blood loss anemia 05/15/2011  ? post surgical  ? Altered mental status 05/15/2011  ? Anxiety   ? B12 deficiency 09/23/2016  ? Blood transfusion without reported diagnosis   ? Cancer [facility])   ? basal and squamous skin carcinoma   ? Cataract   ? Chronic kidney disease   ? hx of uti due to catheter hx of prostatitis, BPH -   ? Depression   ? Diabetes mellitus   ? GERD (gastroesophageal reflux disease)   ? Glaucoma 01/04/2013  ? Heart murmur   ? History of nuclear stress test   ? Myoview 3/17: EF 62%, no ischemia or scar, low risk  ? Hx of transient ischemic attack (TIA) 01/04/2013  ? Hx: UTI (urinary tract infection)   ? Hyperlipidemia   ? Hypertension   ? Hypokalemia 05/15/2011  ? Hypothyroidism   ? Insomnia   ? Macular degeneration   ? Neuropathy   ? OA (osteoarthritis)   ? Pneumonia   ? PVC's (premature ventricular contractions)   ? Renal mass   ? Stroke [facility])   ? mid 1990s   ? ? ?Past Surgical History:  ?Procedure Laterality Date  ? APPENDECTOMY    ? CARDIOVERSION N/A 07/30/2021  ? Procedure: CARDIOVERSION;  Surgeon: Josue Hector,  MD;  Location: [facility];  Service: Cardiovascular;  Laterality: N/A;  ? CATARACT EXTRACTION W/ INTRAOCULAR LENS  IMPLANT, BILATERAL    ? INGUINAL HERNIA REPAIR    ? X3  ? LUMBAR LAMINECTOMY/DECOMPRESSION MICRODISCECTOMY N/A 02/12/2017  ? Procedure: Lumbar Three-Four, Lumbar Four-Five, Lumbar Five-Sacral One Laminectomy and Foraminotomy;  Surgeon: Earnie Larsson, MD;  Location: [place];  Service: Neurosurgery;  Laterality: N/A;  ? OTHER SURGICAL HISTORY    ? microwave procedure for prostate - 09/12   ? PROSTATE ABLATION    ? microwave  ? REPLACEMENT TOTAL KNEE    ? THYROIDECTOMY    ? TONSILLECTOMY AND ADENOIDECTOMY    ? TOTAL KNEE ARTHROPLASTY  05/11/2011  ? Procedure: TOTAL KNEE ARTHROPLASTY;  Surgeon: Gearlean Alf;  Location: [facility];  Service: Orthopedics;  Laterality: Right;  ? US ECHOCARDIOGRAPHY  11/10/2006  ? EF 55-60%  ? US ECHOCARDIOGRAPHY  09/20/2002  ? EF 65-70%  ? ? ?Family History  ?Problem Relation Age of Onset  ? Hypertension Mother   ? Arthritis Mother   ? Diabetes Mother   ? Hypertension Father   ? Stroke Father   ? Kidney disease Father   ? Hypertension Brother   ? Diabetes Brother   ? ? ?  Social History:  reports that he has never smoked. He has never used smokeless tobacco. He reports current alcohol use of about 7.0 standard drinks per week. He reports that he does not use drugs. ? ?Allergies: No Known Allergies ? ?Medications: I have reviewed the patient's current medications. ? ?Results for orders placed or performed during the hospital encounter of 09/16/21 (from the past 48 hour(s))  ?Comprehensive metabolic panel     Status: Abnormal  ? Collection Time: 09/16/21  7:24 AM  ?Result Value Ref Range  ? Sodium 139 135 - 145 mmol/L  ? Potassium 4.4 3.5 - 5.1 mmol/L  ? Chloride 102 98 - 111 mmol/L  ? CO2 28 22 - 32 mmol/L  ? Glucose, Bld 152 (H) 70 - 99 mg/dL  ?  Comment: Glucose reference range applies only to samples taken after fasting for at least 8 hours.  ? BUN 21 8 - 23 mg/dL  ? Creatinine, Ser  1.52 (H) 0.61 - 1.24 mg/dL  ? Calcium 9.5 8.9 - 10.3 mg/dL  ? Total Protein 7.0 6.5 - 8.1 g/dL  ? Albumin 4.2 3.5 - 5.0 g/dL  ? AST 15 15 - 41 U/L  ? ALT 10 0 - 44 U/L  ? Alkaline Phosphatase 61 38 - 126 U/L  ? Total Bilirubin 0.7 0.3 - 1.2 mg/dL  ? GFR, Estimated 43 (L) >60 mL/min  ?  Comment: (NOTE) ?Calculated using the CKD-EPI Creatinine Equation (2021) ?  ? Anion gap 9 5 - 15  ?  Comment: Performed at [facility], [place] [address] [place], [place] [address]  ?CBC with Differential     Status: None  ? Collection Time: 09/16/21  7:24 AM  ?Result Value Ref Range  ? WBC 8.4 4.0 - 10.5 K/uL  ? RBC 4.77 4.22 - 5.81 MIL/uL  ? Hemoglobin 15.6 13.0 - 17.0 g/dL  ? HCT 46.2 39.0 - 52.0 %  ? MCV 96.9 80.0 - 100.0 fL  ? MCH 32.7 26.0 - 34.0 pg  ? MCHC 33.8 30.0 - 36.0 g/dL  ? RDW 12.8 11.5 - 15.5 %  ? Platelets 210 150 - 400 K/uL  ? nRBC 0.0 0.0 - 0.2 %  ? Neutrophils Relative % 78 %  ? Neutro Abs 6.5 1.7 - 7.7 K/uL  ? Lymphocytes Relative 13 %  ? Lymphs Abs 1.1 0.7 - 4.0 K/uL  ? Monocytes Relative 7 %  ? Monocytes Absolute 0.6 0.1 - 1.0 K/uL  ? Eosinophils Relative 1 %  ? Eosinophils Absolute 0.1 0.0 - 0.5 K/uL  ? Basophils Relative 0 %  ? Basophils Absolute 0.0 0.0 - 0.1 K/uL  ? Immature Granulocytes 1 %  ? Abs Immature Granulocytes 0.04 0.00 - 0.07 K/uL  ?  Comment: Performed at [facility], [place] [address], [place], [place] [address]  ?Protime-INR     Status: None  ? Collection Time: 09/16/21  7:24 AM  ?Result Value Ref Range  ? Prothrombin Time 14.6 11.4 - 15.2 seconds  ? INR 1.1 0.8 - 1.2  ?  Comment: (NOTE) ?INR goal varies based on device and disease states. ?Performed at [facility], [place] [address], [place], [place] ?[address] ?  ? ? ?No results found. ? ?Review of Systems ?Blood pressure (!) 175/89, pulse 74, temperature 98.6 ?F (37 ?C), temperature source Oral, resp. rate (!) 21, height '6\' 1"'$  (1.854 m), weight 81.6 kg, SpO2 97 %. ?Physical Exam ? ?Patient appears alert and oriented responding  in complete sentences without shortness of breath or distress ?No active bleeding noted ?Clot on external nose and in left naris ?Without mass or adenopathy ?Pinna normal without mastoid tenderness ?Chest symmetric expansions bilaterally without use of accessory muscles ? ?Assessment/Plan: ? ?Epistaxis -currently controlled ?Platelet dysfunction secondary to medication ? ?I went in the room and sat down in the chair for 25 minutes with Stephen Wilkinson.  We had a nice chat about his current situation and I listened to a long description of the past couple of days.  Once he was done, I asked if you would like me to explain the typical standard of care we provide for this problem.  He was receptive to this.  I explained in some detail the process of removing the clot and examining the nose.  In some cases I am able to cauterize an area but most of the time bleeds such as this in a man with platelet dysfunction require packing.  That packing first begins with a Merocel sponge which I described as an expandable hemostatic sponge placed into the nose.  If that fails, the sponges removed and replaced with an epistat catheter.  Lastly, if he continues to bleed or we take the Epistat catheter out in 2 days and he bleeds again he can either go to the operating room for control of the epistaxis or to interventional radiology for embolization.  As an adjunct therapy but very importantly into this, we should follow cardiology's recommendation to stop the Eliquis.  In my experience, patients on Eliquis who continue that medication tend to fail packing when they are having active epistaxis. ? ?Once explained this to the patient, he had numerous questions and thoughts of his own about how best to manage his nosebleed and medications.  In the end, the patient voiced appreciation for my opinion and having listened to him but declined my help.  Please reconsult as needed.  The good news is he is not actively bleeding now.  I have encouraged  him to speak with his cardiologist to get some clarification on the medication recommendations so we can provide the care he needs should he return to the ER later today or this evening. ? ?Luetta Nutting

## 2021-09-16 NOTE — ED Provider Notes (Signed)
?[place] ?Provider Note ? ? ?CSN: [id number] ?Arrival date & time: 09/16/21  0608 ? ?  ? ?History ? ?Chief Complaint  ?Patient presents with  ? Epistaxis  ? ? ?Stephen Wilkinson is a 88 y.o. male with chief complaint of nosebleed.  Began 3 days ago and worsened over the last 24 hours.  On Eliquis for persistent Afib.  Bleeding occurred the first two days, then pt placed humidifier in his room and the bleeding temporarily ceased.  Then over the last 24 hrs, pt described bleeding as "gushing".  States he woke up and his shirt and face were covered in blood.  Extensive hx of persistent Afib, HTN, diabetes mellitus, CVA, cancer, CAD, HLD, hypothyroidism, CKD, balance/gait disorder, and First degree heart block.  Endorses dizziness and weakness over the last year, and states this hasn't changed since the nosebleed onset.   Denies chest pain, new vision changes, shortness of breath.  Denies recent trauma, fall, or LOC.  Has cardioversion scheduled for end of April.  Contacted cardiologist two days ago about this nosebleed and was instructed not to come of the Eliquis. ? ?The history is provided by the patient and medical records.  ?Epistaxis ? ?  ? ?Home Medications ?Prior to Admission medications   ?Medication Sig Start Date End Date Taking? Authorizing Provider  ?amiodarone (PACERONE) 200 MG tablet Take 1 tablet (200 mg total) by mouth 2 (two) times daily. 08/28/21   [name], MD  ?amiodarone (PACERONE) 200 MG tablet Take 1 tablet (200 mg total) by mouth daily. 09/01/21   [name], MD  ?apixaban (ELIQUIS) 5 MG TABS tablet Take 1 tablet (5 mg total) by mouth 2 (two) times daily. 09/11/21   [name], MD  ?Bevacizumab (AVASTIN IV) Place 1 Syringe into the right eye every 8 (eight) weeks.    [provider]  ?famotidine (PEPCID) 20 MG tablet Take 1 tablet (20 mg total) by mouth 2 (two) times daily. ?Patient taking differently: Take 20 mg by mouth in the  morning. 04/10/20   Biagio Borg, MD  ?glucose blood (ONE TOUCH ULTRA TEST) test strip Use as directed once daily to check blood sugar.  Diagnosis code E11.42 10/09/20   Biagio Borg, MD  ?latanoprost (XALATAN) 0.005 % ophthalmic solution Place 1 drop into both eyes at bedtime. 01/16/21 01/16/22  Rankin, Clent Demark, MD  ?levothyroxine (SYNTHROID) 125 MCG tablet TAKE 1 TABLET EVERY DAY BEFORE BREAKFAST ?Patient taking differently: Take 125 mcg by mouth daily before breakfast. 06/24/21   Biagio Borg, MD  ?metFORMIN (GLUCOPHAGE) 1000 MG tablet TAKE 1 TABLET EVERY DAY ?Patient taking differently: Take 1,000 mg by mouth daily with breakfast. 04/29/21   Biagio Borg, MD  ?metoprolol succinate (TOPROL-XL) 25 MG 24 hr tablet Take 1 tablet (25 mg total) by mouth every morning. 05/30/21   [name], MD  ?Multiple Vitamins-Minerals (ICAPS AREDS 2 PO) Take 2 capsules by mouth daily.    [provider]  ?predniSONE (DELTASONE) 10 MG tablet 3 tabs by mouth per day for 3 days,2tabs per day for 3 days,1tab per day for 3 days 08/26/21   Biagio Borg, MD  ?QUEtiapine (SEROQUEL) 25 MG tablet Take 1 tablet (25 mg total) by mouth at bedtime. ?Patient not taking: Reported on 08/28/2021 08/26/21   Biagio Borg, MD  ?tiZANidine (ZANAFLEX) 4 MG tablet TAKE 1 TABLET (4 MG TOTAL) BY MOUTH EVERY 6 (SIX) HOURS AS NEEDED FOR MUSCLE  SPASMS. 12/01/20   Biagio Borg, MD  ?traMADol (ULTRAM) 50 MG tablet Take 1 tablet (50 mg total) by mouth every 6 (six) hours as needed. ?Patient not taking: Reported on 08/28/2021 08/26/21   Biagio Borg, MD  ?   ? ?Allergies    ?Patient has no known allergies.   ? ?Review of Systems   ?Review of Systems  ?HENT:  Positive for nosebleeds.   ? ?Physical Exam ?Updated Vital Signs ?BP (!) 151/91 (BP Location: Left Arm)   Pulse 63   Temp 98.6 ?F (37 ?C) (Oral)   Resp 16   Ht '6\' 1"'$  (1.854 m)   Wt 81.6 kg   SpO2 98%   BMI 23.75 kg/m?  ?Physical Exam ?Vitals and nursing note reviewed.  ?Constitutional:   ?    General: He is not in acute distress. ?   Appearance: Normal appearance. He is well-developed. He is not ill-appearing or diaphoretic.  ?HENT:  ?   Head: Normocephalic and atraumatic.  ?   Nose:  ?   Left Nostril: Epistaxis present.  ?   Mouth/Throat:  ?   Mouth: Mucous membranes are moist.  ?   Pharynx: Oropharynx is clear. Uvula midline.  ? ?   Comments: Blood visualized in posterior oropharynx ?Soft clot visualized protruding from left nare ? ?Eyes:  ?   General: No scleral icterus. ?   Conjunctiva/sclera: Conjunctivae normal.  ?Cardiovascular:  ?   Rate and Rhythm: Normal rate. Rhythm irregular.  ?   Heart sounds: Murmur (Systolic) heard.  ?Pulmonary:  ?   Effort: Pulmonary effort is normal. No respiratory distress.  ?   Breath sounds: Normal breath sounds. No wheezing.  ?Abdominal:  ?   General: Bowel sounds are normal.  ?   Palpations: Abdomen is soft.  ?   Tenderness: There is no abdominal tenderness.  ?Musculoskeletal:     ?   General: No swelling.  ?   Cervical back: Neck supple.  ?Skin: ?   General: Skin is warm and dry.  ?   Capillary Refill: Capillary refill takes less than 2 seconds.  ?Neurological:  ?   Mental Status: He is alert and oriented to person, place, and time.  ?Psychiatric:     ?   Mood and Affect: Mood normal.  ? ? ?ED Results / Procedures / Treatments   ?Labs ?(all labs ordered are listed, but only abnormal results are displayed) ?Labs Reviewed  ?COMPREHENSIVE METABOLIC PANEL - Abnormal; Notable for the following components:  ?    Result Value  ? Glucose, Bld 152 (*)   ? Creatinine, Ser 1.52 (*)   ? GFR, Estimated 43 (*)   ? All other components within normal limits  ?CBC WITH DIFFERENTIAL/PLATELET  ?PROTIME-INR  ? ? ?EKG ?EKG Interpretation ? ?Date/Time:  Tuesday September 16 2021 07:10:45 EDT ?Ventricular Rate:  72 ?PR Interval:    ?QRS Duration: 109 ?QT Interval:  427 ?QTC Calculation: 468 ?R Axis:   -19 ?Text Interpretation: Atrial fibrillation Nonspecific ST abnormality Confirmed by  Lajean Saver [phone]) on 09/17/2021 11:44:32 AM ? ?Radiology ?No results found. ? ?Procedures ?Procedures  ? ? ?Medications Ordered in ED ?Medications  ?oxymetazoline (AFRIN) 0.05 % nasal spray 1 spray (1 spray Each Nare Given 09/16/21 0813)  ? ? ?ED Course/ Medical Decision Making/ A&P ?  ?                        ?Medical Decision Making ?Amount  and/or Complexity of Data Reviewed ?External Data Reviewed: notes. ?Labs: ordered. Decision-making details documented in ED Course. ?Radiology:  Decision-making details documented in ED Course. ?ECG/medicine tests:  Decision-making details documented in ED Course. ? ?Risk ?OTC drugs. ?Prescription drug management. ? ? ?88 y.o. male presents to the ED for concern of Epistaxis.  This involves an extensive number of treatment options, and is a complaint that carries with it a high risk of complications and morbidity.  The differential diagnosis includes epistaxis, nasal trauma ? ?Comorbidities that complicate the patient evaluation include HTN, HLD, diabetes mellitus, anxiety, hypothyroidism, depression, chronic atrial fibrillation, chronic kidney disease, prior CVA, GERD, CAD, gait disorder, chronic weakness/fatigue ? ?Additional history obtained from spouse and internal/external records available via epic ? ?Interpretation: ?I ordered, and personally interpreted labs.  The pertinent results include:   ?-CBC: no evidence of anemia ?-PT-INR: unremarkable ?-CMP: mild elevated creatinine of 1.52; hx of chronic kidney disease ? ?I ordered and independently visualized EKG 12 lead which showed atrial fibrillation with nonspecific ST abnormality.   ? ?Intervention: ?The patient was maintained on a cardiac monitor.  I personally viewed and interpreted the cardiac monitored which showed an underlying rhythm of: atrial fibrillation ? ?I requested consultation with the ENT and cardiology,  and discussed lab and imaging findings as well as pertinent plan.  ENT requested the ability to  temporarily stop pt's anticoagulation for epistaxis treatment.  Cardiology agreed that if this is necessary, this would put him at increased risk for a thrombus such as a CVA due to chronic Afib, but that he is not chronica

## 2021-09-16 NOTE — Discharge Instructions (Addendum)
Follow up with your cardiologist within the next 2-3 days as discussed for re-evaluation and continued medical management ? ?Return to the ED for new or worsening symptoms as discussed ?

## 2021-09-17 ENCOUNTER — Telehealth: Payer: Self-pay | Admitting: Cardiology

## 2021-09-17 NOTE — Telephone Encounter (Signed)
Patient states he was recently in the hospital. He says the doctor at the hospital told him to contact the office to speak with Dr. Stanford Breed to see what kind of blood work he needs to have done.  ?

## 2021-09-17 NOTE — Telephone Encounter (Signed)
Spoke with pt, Aware of dr crenshaw's recommendations.  °

## 2021-09-17 NOTE — Telephone Encounter (Signed)
Message routed to [facility] MD to review hospital notes and advise what labs, if any are needed.  ?

## 2021-09-24 ENCOUNTER — Encounter (INDEPENDENT_AMBULATORY_CARE_PROVIDER_SITE_OTHER): Payer: Medicare Other | Admitting: Ophthalmology

## 2021-09-24 ENCOUNTER — Encounter (INDEPENDENT_AMBULATORY_CARE_PROVIDER_SITE_OTHER): Payer: Self-pay | Admitting: Ophthalmology

## 2021-09-24 ENCOUNTER — Ambulatory Visit (INDEPENDENT_AMBULATORY_CARE_PROVIDER_SITE_OTHER): Payer: Medicare Other | Admitting: Ophthalmology

## 2021-09-24 DIAGNOSIS — H353211 Exudative age-related macular degeneration, right eye, with active choroidal neovascularization: Secondary | ICD-10-CM | POA: Diagnosis not present

## 2021-09-24 DIAGNOSIS — H353124 Nonexudative age-related macular degeneration, left eye, advanced atrophic with subfoveal involvement: Secondary | ICD-10-CM | POA: Diagnosis not present

## 2021-09-24 DIAGNOSIS — H353113 Nonexudative age-related macular degeneration, right eye, advanced atrophic without subfoveal involvement: Secondary | ICD-10-CM | POA: Diagnosis not present

## 2021-09-24 DIAGNOSIS — H35371 Puckering of macula, right eye: Secondary | ICD-10-CM

## 2021-09-24 MED ORDER — BEVACIZUMAB 2.5 MG/0.1ML IZ SOSY
2.5000 mg | PREFILLED_SYRINGE | INTRAVITREAL | Status: AC | PRN
  Administered 2021-09-24: 2.5 mg via INTRAVITREAL

## 2021-09-24 NOTE — Assessment & Plan Note (Signed)
Large macular geographic atrophy limits acuity.  Involves entire clinicians macula ?

## 2021-09-24 NOTE — Progress Notes (Signed)
? ? ?09/24/2021 ? ?  ? ?CHIEF COMPLAINT ?Patient presents for  ?Chief Complaint  ?Patient presents with  ? Macular Degeneration  ? ? ? ? ?HISTORY OF PRESENT ILLNESS: ?Stephen Wilkinson is a 88 y.o. male who presents to the clinic today for:  ? ?HPI   ?7 weeks dilate OU, Avastin OCT, OD, Color FP. ?Patient states vision is stable and unchanged since last visit. Denies any new floaters or FOL. ?Patient reports he had a heart attack 2 weeks ago. Patient is currently using latanoprost QHS OU. ?Last edited by Laurin Coder on 09/24/2021  9:16 AM.  ?  ? ? ?Referring physician: ?Luberta Mutter, MD ?[address] ?[place],  [place] [address] ? ?HISTORICAL INFORMATION:  ? ?Selected notes from the [facility] [place] ?  ? ?Lab Results  ?Component Value Date  ? HGBA1C 7.4 (H) 08/27/2021  ?  ? ?CURRENT MEDICATIONS: ?Current Outpatient Medications (Ophthalmic Drugs)  ?Medication Sig  ? latanoprost (XALATAN) 0.005 % ophthalmic solution Place 1 drop into both eyes at bedtime.  ? ?No current facility-administered medications for this visit. (Ophthalmic Drugs)  ? ?Current Outpatient Medications (Other)  ?Medication Sig  ? amiodarone (PACERONE) 200 MG tablet Take 1 tablet (200 mg total) by mouth 2 (two) times daily.  ? amiodarone (PACERONE) 200 MG tablet Take 1 tablet (200 mg total) by mouth daily.  ? apixaban (ELIQUIS) 5 MG TABS tablet Take 1 tablet (5 mg total) by mouth 2 (two) times daily.  ? Bevacizumab (AVASTIN IV) Place 1 Syringe into the right eye every 8 (eight) weeks.  ? famotidine (PEPCID) 20 MG tablet Take 1 tablet (20 mg total) by mouth 2 (two) times daily. (Patient taking differently: Take 20 mg by mouth in the morning.)  ? glucose blood (ONE TOUCH ULTRA TEST) test strip Use as directed once daily to check blood sugar.  Diagnosis code E11.42  ? levothyroxine (SYNTHROID) 125 MCG tablet TAKE 1 TABLET EVERY DAY BEFORE BREAKFAST (Patient taking differently: Take 125 mcg by mouth daily before breakfast.)  ? metFORMIN (GLUCOPHAGE)  1000 MG tablet TAKE 1 TABLET EVERY DAY (Patient taking differently: Take 1,000 mg by mouth daily with breakfast.)  ? metoprolol succinate (TOPROL-XL) 25 MG 24 hr tablet Take 1 tablet (25 mg total) by mouth every morning.  ? Multiple Vitamins-Minerals (ICAPS AREDS 2 PO) Take 2 capsules by mouth daily.  ? predniSONE (DELTASONE) 10 MG tablet 3 tabs by mouth per day for 3 days,2tabs per day for 3 days,1tab per day for 3 days  ? QUEtiapine (SEROQUEL) 25 MG tablet Take 1 tablet (25 mg total) by mouth at bedtime. (Patient not taking: Reported on 08/28/2021)  ? tiZANidine (ZANAFLEX) 4 MG tablet TAKE 1 TABLET (4 MG TOTAL) BY MOUTH EVERY 6 (SIX) HOURS AS NEEDED FOR MUSCLE SPASMS.  ? traMADol (ULTRAM) 50 MG tablet Take 1 tablet (50 mg total) by mouth every 6 (six) hours as needed. (Patient not taking: Reported on 08/28/2021)  ? ?No current facility-administered medications for this visit. (Other)  ? ? ? ? ?REVIEW OF SYSTEMS: ?ROS   ?Negative for: Constitutional, Gastrointestinal, Neurological, Skin, Genitourinary, Musculoskeletal, HENT, Endocrine, Cardiovascular, Eyes, Respiratory, Psychiatric, Allergic/Imm, Heme/Lymph ?Last edited by Hurman Horn, MD on 09/24/2021  9:58 AM.  ?  ? ? ? ?ALLERGIES ?No Known Allergies ? ?PAST MEDICAL HISTORY ?Past Medical History:  ?Diagnosis Date  ? Acute blood loss anemia 05/15/2011  ? post surgical  ? Altered mental status 05/15/2011  ? Anxiety   ? B12 deficiency  09/23/2016  ? Blood transfusion without reported diagnosis   ? Cancer [facility])   ? basal and squamous skin carcinoma   ? Cataract   ? Chronic kidney disease   ? hx of uti due to catheter hx of prostatitis, BPH -   ? Depression   ? Diabetes mellitus   ? GERD (gastroesophageal reflux disease)   ? Glaucoma 01/04/2013  ? Heart murmur   ? History of nuclear stress test   ? Myoview 3/17: EF 62%, no ischemia or scar, low risk  ? Hx of transient ischemic attack (TIA) 01/04/2013  ? Hx: UTI (urinary tract infection)   ? Hyperlipidemia   ? Hypertension    ? Hypokalemia 05/15/2011  ? Hypothyroidism   ? Insomnia   ? Macular degeneration   ? Neuropathy   ? OA (osteoarthritis)   ? Pneumonia   ? PVC's (premature ventricular contractions)   ? Renal mass   ? Stroke [facility])   ? mid 1990s   ? ?Past Surgical History:  ?Procedure Laterality Date  ? APPENDECTOMY    ? CARDIOVERSION N/A 07/30/2021  ? Procedure: CARDIOVERSION;  Surgeon: Josue Hector, MD;  Location: [facility] ENDOSCOPY;  Service: Cardiovascular;  Laterality: N/A;  ? CATARACT EXTRACTION W/ INTRAOCULAR LENS  IMPLANT, BILATERAL    ? INGUINAL HERNIA REPAIR    ? X3  ? LUMBAR LAMINECTOMY/DECOMPRESSION MICRODISCECTOMY N/A 02/12/2017  ? Procedure: Lumbar Three-Four, Lumbar Four-Five, Lumbar Five-Sacral One Laminectomy and Foraminotomy;  Surgeon: Earnie Larsson, MD;  Location: [place];  Service: Neurosurgery;  Laterality: N/A;  ? OTHER SURGICAL HISTORY    ? microwave procedure for prostate - 09/12   ? PROSTATE ABLATION    ? microwave  ? REPLACEMENT TOTAL KNEE    ? THYROIDECTOMY    ? TONSILLECTOMY AND ADENOIDECTOMY    ? TOTAL KNEE ARTHROPLASTY  05/11/2011  ? Procedure: TOTAL KNEE ARTHROPLASTY;  Surgeon: Gearlean Alf;  Location: [facility];  Service: Orthopedics;  Laterality: Right;  ? US ECHOCARDIOGRAPHY  11/10/2006  ? EF 55-60%  ? US ECHOCARDIOGRAPHY  09/20/2002  ? EF 65-70%  ? ? ?FAMILY HISTORY ?Family History  ?Problem Relation Age of Onset  ? Hypertension Mother   ? Arthritis Mother   ? Diabetes Mother   ? Hypertension Father   ? Stroke Father   ? Kidney disease Father   ? Hypertension Brother   ? Diabetes Brother   ? ? ?SOCIAL HISTORY ?Social History  ? ?Tobacco Use  ? Smoking status: Never  ? Smokeless tobacco: Never  ?Vaping Use  ? Vaping Use: Never used  ?Substance Use Topics  ? Alcohol use: Yes  ?  Alcohol/week: 7.0 standard drinks  ?  Types: 7 Shots of liquor per week  ?  Comment: 2 drinks most days  ? Drug use: No  ? ?  ? ?  ? ?OPHTHALMIC EXAM: ? ?Base Eye Exam   ? ? Visual Acuity (ETDRS)   ? ?   Right Left  ? Dist cc 20/40 -1 CF  at face  ? Dist ph cc NI NI  ? ? Correction: Glasses  ? ?  ?  ? ? Tonometry (Tonopen, 9:20 AM)   ? ?   Right Left  ? Pressure 13 12  ? ?  ?  ? ? Pupils   ? ?   Pupils Dark Light APD  ? Right PERRL 3 2 None  ? Left PERRL 3 2 None  ? ?  ?  ? ? Visual Fields   ? ?  Left Right  ?  Full Full  ? ?  ?  ? ? Extraocular Movement   ? ?   Right Left  ?  Full Full  ? ?  ?  ? ? Neuro/Psych   ? ? Oriented x3: Yes  ? Mood/Affect: Normal  ? ?  ?  ? ? Dilation   ? ? Both eyes: 1.0% Mydriacyl, 2.5% Phenylephrine @ 9:20 AM  ? ?  ?  ? ?  ? ?Slit Lamp and Fundus Exam   ? ? External Exam   ? ?   Right Left  ? External Normal Normal  ? ?  ?  ? ? Slit Lamp Exam   ? ?   Right Left  ? Lids/Lashes Normal Normal  ? Conjunctiva/Sclera White and quiet White and quiet  ? Cornea Clear Clear  ? Anterior Chamber Deep and quiet Deep and quiet  ? Iris Round and reactive Round and reactive  ? Lens Posterior chamber intraocular lens Posterior chamber intraocular lens  ? Anterior Vitreous Normal Normal  ? ?  ?  ? ? Fundus Exam   ? ?   Right Left  ? Posterior Vitreous Normal Clear and vitrectomized  ? Disc Normal Normal  ? C/D Ratio 0.35 0.35  ? Macula Advanced age related macular degeneration, Geographic atrophy, Hard drusen, no exudates, no clinical Subretinal neovascular membrane, no hemorrhage, Retinal pigment epithelial mottling, Retinal pigment epithelial atrophy, small hemorrhage on nonfoveal side of atrophy, temporally but not larger Advanced age related macular degeneration, Pigmented atrophy, Retinal pigment epithelial atrophy - geographic  ? Vessels Normal Normal  ? Periphery Normal Normal  ? ?  ?  ? ?  ? ? ?IMAGING AND PROCEDURES  ?Imaging and Procedures for 09/24/21 ? ?Intravitreal Injection, Pharmacologic Agent - OD - Right Eye   ? ?   ?Time Out ?09/24/2021. 10:01 AM. Confirmed correct patient, procedure, site, and patient consented.  ? ?Anesthesia ?Topical anesthesia was used. Anesthetic medications included Lidocaine 4%.   ? ?Procedure ?Preparation included 5% betadine to ocular surface, 10% betadine to eyelids, Tobramycin 0.3%. A 30 gauge needle was used.  ? ?Injection: ?2.5 mg bevacizumab 2.5 MG/0.1ML ?  Route: Intravitreal, Site: [employer]

## 2021-09-24 NOTE — Assessment & Plan Note (Signed)
Evaluation and seen only on OCT no impact on acuity observe ?

## 2021-09-24 NOTE — Assessment & Plan Note (Signed)
Parafoveal atrophy yet preserved acuity continue to monitor and observe ?

## 2021-09-24 NOTE — Assessment & Plan Note (Signed)
Chronic active with history of multiple recurrences beyond 7-week interval and this monocular patient will continue to treat today to maintain.  At 7-week interval ? ?Repeat injection today and evaluate symptoms ?

## 2021-10-01 ENCOUNTER — Encounter (HOSPITAL_COMMUNITY): Payer: Self-pay | Admitting: Cardiology

## 2021-10-08 ENCOUNTER — Ambulatory Visit (HOSPITAL_COMMUNITY): Payer: Medicare Other | Admitting: Certified Registered Nurse Anesthetist

## 2021-10-08 ENCOUNTER — Encounter (HOSPITAL_COMMUNITY): Payer: Self-pay | Admitting: Cardiology

## 2021-10-08 ENCOUNTER — Encounter (HOSPITAL_COMMUNITY)
Admission: RE | Disposition: A | Discharge status: Home / Self Care | Payer: Self-pay | Source: Home / Self Care | Attending: Cardiology

## 2021-10-08 ENCOUNTER — Ambulatory Visit (HOSPITAL_COMMUNITY)
Admission: RE | Admit: 2021-10-08 | Discharge: 2021-10-08 | Disposition: A | Discharge status: Home / Self Care | Payer: Medicare Other | Attending: Cardiology | Admitting: Cardiology

## 2021-10-08 DIAGNOSIS — Z8673 Personal history of transient ischemic attack (TIA), and cerebral infarction without residual deficits: Secondary | ICD-10-CM | POA: Insufficient documentation

## 2021-10-08 DIAGNOSIS — Z7901 Long term (current) use of anticoagulants: Secondary | ICD-10-CM | POA: Diagnosis not present

## 2021-10-08 DIAGNOSIS — I10 Essential (primary) hypertension: Secondary | ICD-10-CM | POA: Diagnosis not present

## 2021-10-08 DIAGNOSIS — I4819 Other persistent atrial fibrillation: Secondary | ICD-10-CM | POA: Diagnosis not present

## 2021-10-08 DIAGNOSIS — I350 Nonrheumatic aortic (valve) stenosis: Secondary | ICD-10-CM | POA: Diagnosis not present

## 2021-10-08 DIAGNOSIS — Z538 Procedure and treatment not carried out for other reasons: Secondary | ICD-10-CM | POA: Diagnosis not present

## 2021-10-08 SURGERY — CANCELLED PROCEDURE
Anesthesia: General

## 2021-10-08 NOTE — Procedures (Signed)
Electrical Cardioversion Procedure Note ?Stephen Wilkinson ?[id number] ?1928/08/03 ? ?Procedure: Electrical Cardioversion ?Indications:  Atrial Fibrillation ? ?Procedure Details ?Procedure canceled as he did not start amiodarone as instructed; he will initiate amiodarone and we will reschedule in 6-8 weeks ? ? ? ?Kirk Ruths ?10/08/2021, 10:19 AM ? ? ? ?

## 2021-10-08 NOTE — H&P (Addendum)
Office Visit ?08/28/2021 ?[facility] ?[name], MD ?Cardiology Persistent atrial fibrillation [facility]) +3 more ?Dx Follow-up  Atrial Fibrillation; Referred by Biagio Borg, MD ?Reason for Visit  ? ?Additional Documentation ? ?Vitals:  BP 116/76 ?Pulse 91 ?Ht '6\' 1"'$  (1.854 m) ?Wt 80.6 kg ?SpO2 96% ?BMI 23.46 kg/m? ?BSA 2.04 m? ? ?More Vitals  ?Flowsheets:  MEWS Score, ?Anthropometrics, ?NEWS ?  ?Encounter Info:  Billing Info, ?History, ?Allergies, ?Detailed Report ?  ? ?All Notes ? ? ? Progress Notes by [name], MD at 08/28/2021 3:30 PM ? ?Author: [name], MD Author Type: Physician Filed: 08/28/2021  3:45 PM  ?Note Status: Signed Cosign: Cosign Not Required Encounter Date: 08/28/2021  ?Editor: [name], MD (Physician)      ?       ?  ? ?  ?  ?  ?  ?TML:[id number] hypertension and atrial fibrillation. Also history of TIA. Nuclear study March 2017 showed ejection fraction 62% and no ischemia.  Carotid Dopplers May 2020 showed 1 to 39% bilateral stenosis. Question episodes of syncope at previous ov. Monitor 4/21 showed sinus with PACs, PVCs brief PAT but no pauses.  Found to be in atrial fibrillation at last office visit May 30, 2021.  Apixaban was initiated with plans for cardioversion if atrial fibrillation persisted as he did complain of increased fatigue.  Echocardiogram repeated January 2023 and showed normal LV function, mild left ventricular hypertrophy, mild right ventricular enlargement, mild left atrial enlargement, mild to moderate tricuspid regurgitation, mildly dilated ascending aorta, moderate aortic stenosis with mean gradient 12 mmHg, aortic valve area 1.5 cm? and dimensionless index 0.3.  TEE February 2023 showed no left atrial appendage thrombus, ejection fraction 55 to 60%, mild biatrial enlargement, mild mitral regurgitation and moderate aortic stenosis.  Patient had successful cardioversion to normal sinus rhythm.  Since last seen, patient states that  for 1 week following his cardioversion he felt much improved.  However he then developed worsening dyspnea on exertion and fatigue similar to symptoms he had with atrial fibrillation.  No chest pain, palpitations or syncope. ?  ?      ?Current Outpatient Medications  ?Medication Sig Dispense Refill  ? apixaban (ELIQUIS) 5 MG TABS tablet Take 1 tablet (5 mg total) by mouth 2 (two) times daily. 180 tablet 1  ? Bevacizumab (AVASTIN IV) Place 1 Syringe into the right eye every 8 (eight) weeks.      ? famotidine (PEPCID) 20 MG tablet Take 1 tablet (20 mg total) by mouth 2 (two) times daily. (Patient taking differently: Take 20 mg by mouth in the morning.) 180 tablet 3  ? glucose blood (ONE TOUCH ULTRA TEST) test strip Use as directed once daily to check blood sugar.  Diagnosis code E11.42 100 each 11  ? latanoprost (XALATAN) 0.005 % ophthalmic solution Place 1 drop into both eyes at bedtime. 2.5 mL 12  ? levothyroxine (SYNTHROID) 125 MCG tablet TAKE 1 TABLET EVERY DAY BEFORE BREAKFAST 90 tablet 3  ? metFORMIN (GLUCOPHAGE) 1000 MG tablet TAKE 1 TABLET EVERY DAY 90 tablet 0  ? metoprolol succinate (TOPROL-XL) 25 MG 24 hr tablet Take 1 tablet (25 mg total) by mouth every morning. 90 tablet 3  ? Multiple Vitamins-Minerals (ICAPS AREDS 2 PO) Take 2 capsules by mouth daily.      ? predniSONE (DELTASONE) 10 MG tablet 3 tabs by mouth per day for 3 days,2tabs per day for 3 days,1tab per day for 3 days 18 tablet  0  ? tiZANidine (ZANAFLEX) 4 MG tablet TAKE 1 TABLET (4 MG TOTAL) BY MOUTH EVERY 6 (SIX) HOURS AS NEEDED FOR MUSCLE SPASMS. 360 tablet 3  ? QUEtiapine (SEROQUEL) 25 MG tablet Take 1 tablet (25 mg total) by mouth at bedtime. (Patient not taking: Reported on 08/28/2021) 60 tablet 5  ? traMADol (ULTRAM) 50 MG tablet Take 1 tablet (50 mg total) by mouth every 6 (six) hours as needed. (Patient not taking: Reported on 08/28/2021) 30 tablet 0  ?  ?No current facility-administered medications for this visit.  ?  ?  ?  ?    ?Past  Medical History:  ?Diagnosis Date  ? Acute blood loss anemia 05/15/2011  ?  post surgical  ? Altered mental status 05/15/2011  ? Anxiety    ? B12 deficiency 09/23/2016  ? Blood transfusion without reported diagnosis    ? Cancer [facility])    ?  basal and squamous skin carcinoma   ? Cataract    ? Chronic kidney disease    ?  hx of uti due to catheter hx of prostatitis, BPH -   ? Depression    ? Diabetes mellitus    ? GERD (gastroesophageal reflux disease)    ? Glaucoma 01/04/2013  ? Heart murmur    ? History of nuclear stress test    ?  Myoview 3/17: EF 62%, no ischemia or scar, low risk  ? Hx of transient ischemic attack (TIA) 01/04/2013  ? Hx: UTI (urinary tract infection)    ? Hyperlipidemia    ? Hypertension    ? Hypokalemia 05/15/2011  ? Hypothyroidism    ? Insomnia    ? Macular degeneration    ? Neuropathy    ? OA (osteoarthritis)    ? Pneumonia    ? PVC's (premature ventricular contractions)    ? Renal mass    ? Stroke [facility])    ?  mid 1990s   ?  ?  ?     ?Past Surgical History:  ?Procedure Laterality Date  ? APPENDECTOMY      ? CARDIOVERSION N/A 07/30/2021  ?  Procedure: CARDIOVERSION;  Surgeon: Josue Hector, MD;  Location: [facility] ENDOSCOPY;  Service: Cardiovascular;  Laterality: N/A;  ? CATARACT EXTRACTION W/ INTRAOCULAR LENS  IMPLANT, BILATERAL      ? INGUINAL HERNIA REPAIR      ?  X3  ? LUMBAR LAMINECTOMY/DECOMPRESSION MICRODISCECTOMY N/A 02/12/2017  ?  Procedure: Lumbar Three-Four, Lumbar Four-Five, Lumbar Five-Sacral One Laminectomy and Foraminotomy;  Surgeon: Earnie Larsson, MD;  Location: [place];  Service: Neurosurgery;  Laterality: N/A;  ? OTHER SURGICAL HISTORY      ?  microwave procedure for prostate - 09/12   ? PROSTATE ABLATION      ?  microwave  ? REPLACEMENT TOTAL KNEE      ? THYROIDECTOMY      ? TONSILLECTOMY AND ADENOIDECTOMY      ? TOTAL KNEE ARTHROPLASTY   05/11/2011  ?  Procedure: TOTAL KNEE ARTHROPLASTY;  Surgeon: Gearlean Alf;  Location: [facility];  Service: Orthopedics;  Laterality: Right;  ? US  ECHOCARDIOGRAPHY   11/10/2006  ?  EF 55-60%  ? US ECHOCARDIOGRAPHY   09/20/2002  ?  EF 65-70%  ?  ?  ?Social History  ?  ?     ?Socioeconomic History  ? Marital status: Married  ?    Spouse name: Not on file  ? Number of children: 3  ? Years of education: Not  on file  ? Highest education level: Not on file  ?Occupational History  ? Occupation: retired  ?    Employer: RETIRED  ?    Comment: [employer]  ?Tobacco Use  ? Smoking status: Never  ? Smokeless tobacco: Never  ?Vaping Use  ? Vaping Use: Never used  ?Substance and Sexual Activity  ? Alcohol use: Yes  ?    Alcohol/week: 7.0 standard drinks  ?    Types: 7 Shots of liquor per week  ?    Comment: 2 drinks most days  ? Drug use: No  ? Sexual activity: Never  ?    Birth control/protection: None  ?Other Topics Concern  ? Not on file  ?Social History Narrative  ?  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     ?  Home Situation: living with wife  ?     ?  Spiritual Beliefs: episcopalian  ?     ?  Lifestyle: walks daily, working on diet  ?     ?     ?     ?  ?Social Determinants of Health  ?  ?   ?Financial Resource Strain: Low Risk   ? Difficulty of Paying Living Expenses: Not hard at all  ?Food Insecurity: No Food Insecurity  ? Worried About Charity fundraiser in the Last Year: Never true  ? Ran Out of Food in the Last Year: Never true  ?Transportation Needs: No Transportation Needs  ? Lack of Transportation (Medical): No  ? Lack of Transportation (Non-Medical): No  ?Physical Activity: Inactive  ? Days of Exercise per Week: 0 days  ? Minutes of Exercise per Session: 0 min  ?Stress: No Stress Concern Present  ? Feeling of Stress : Not at all  ?Social Connections: Not on file  ?Intimate Partner Violence: Not on file  ?  ?  ?     ?Family History  ?Problem Relation Age of Onset  ? Hypertension Mother    ? Arthritis Mother    ? Diabetes Mother    ? Hypertension Father    ? Stroke Father    ? Kidney disease Father    ? Hypertension Brother     ? Diabetes Brother    ?  ?  ?ROS: no fevers or chills, productive cough, hemoptysis, dysphasia, odynophagia, melena, hematochezia, dysuria, hematuria, rash, seizure activity, orthopnea, PND, pedal edema, clau

## 2021-10-08 NOTE — Anesthesia Preprocedure Evaluation (Deleted)
Anesthesia Evaluation  ?Patient identified by MRN, date of birth, ID band ?Patient awake ? ? ? ?Reviewed: ?Allergy & Precautions, H&P , NPO status , Patient's Chart, lab work & pertinent test results, reviewed documented beta blocker date and time  ? ?Airway ?Mallampati: III ? ?TM Distance: >3 FB ?Neck ROM: Full ? ? ? Dental ?no notable dental hx. ?(+) Teeth Intact, Dental Advisory Given ?  ?Pulmonary ?neg pulmonary ROS,  ?  ?Pulmonary exam normal ?breath sounds clear to auscultation ? ? ? ? ? ? Cardiovascular ?hypertension, Pt. on medications and Pt. on home beta blockers ?+ dysrhythmias Atrial Fibrillation + Valvular Problems/Murmurs AS  ?Rhythm:Irregular Rate:Normal ? ? ?  ?Neuro/Psych ?Anxiety Depression CVA   ? GI/Hepatic ?Neg liver ROS, GERD  Medicated,  ?Endo/Other  ?diabetes, Type 2, Oral Hypoglycemic AgentsHypothyroidism  ? Renal/GU ?Renal InsufficiencyRenal disease  ?negative genitourinary ?  ?Musculoskeletal ? ?(+) Arthritis , Osteoarthritis,   ? Abdominal ?  ?Peds ? Hematology ? ?(+) Blood dyscrasia, anemia ,   ?Anesthesia Other Findings ? ? Reproductive/Obstetrics ?negative OB ROS ? ?  ? ? ? ? ? ? ? ? ? ? ? ? ? ?  ?  ? ? ? ? ? ? ? ? ?Anesthesia Physical ?Anesthesia Plan ? ?ASA: 3 ? ?Anesthesia Plan: General  ? ?Post-op Pain Management: Minimal or no pain anticipated  ? ?Induction: Intravenous ? ?PONV Risk Score and Plan: 2 and Propofol infusion and Treatment may vary due to age or medical condition ? ?Airway Management Planned: Mask ? ?Additional Equipment:  ? ?Intra-op Plan:  ? ?Post-operative Plan:  ? ?Informed Consent: I have reviewed the patients History and Physical, chart, labs and discussed the procedure including the risks, benefits and alternatives for the proposed anesthesia with the patient or authorized representative who has indicated his/her understanding and acceptance.  ? ? ? ?Dental advisory given ? ?Plan Discussed with: CRNA ? ?Anesthesia Plan  Comments: (Case cancelled due to pt had not started amiodarone.)  ? ? ? ? ? ?Anesthesia Quick Evaluation ? ?

## 2021-10-21 ENCOUNTER — Ambulatory Visit (INDEPENDENT_AMBULATORY_CARE_PROVIDER_SITE_OTHER): Payer: Medicare Other | Admitting: Podiatry

## 2021-10-21 DIAGNOSIS — Z7901 Long term (current) use of anticoagulants: Secondary | ICD-10-CM | POA: Diagnosis not present

## 2021-10-21 DIAGNOSIS — B351 Tinea unguium: Secondary | ICD-10-CM

## 2021-10-21 DIAGNOSIS — M79676 Pain in unspecified toe(s): Secondary | ICD-10-CM | POA: Diagnosis not present

## 2021-10-23 NOTE — Progress Notes (Signed)
Subjective: ?88 y.o. returns the office today for painful, elongated, thickened toenails which he cannot trim himself. Denies any redness or drainage around the nails.  States that the heel pain has been doing well.  Denies any systemic complaints such as fevers, chills, nausea, vomiting.  ? ?PCP: Biagio Borg, MD ?A1c: 7.4 on August 27, 2021 ? ?Objective: ?NAD ?DP/PT pulses palpable, CRT less than 3 seconds ?Nails hypertrophic, dystrophic, elongated, brittle, discolored ?10. There is tenderness overlying the nails 1-5 bilaterally. There is no surrounding erythema or drainage along the nail sites. ?No significant pain on course or insertion of plantar fascia today.  No areas of pinpoint tenderness otherwise ?No open lesions or pre-ulcerative lesions are identified. ?No pain with calf compression, swelling, warmth, erythema. ? ?Assessment: ?Patient presents with symptomatic onychomycosis ? ?Plan: ?-Treatment options including alternatives, risks, complications were discussed ?-Nails sharply debrided ?10 without complication/bleeding. ?-Continue stretching for plantar fasciitis as well as wearing supportive shoe gear. ?-Discussed daily foot inspection. If there are any changes, to call the office immediately.  ?-Follow-up in 3 months or sooner if any problems are to arise. In the meantime, encouraged to call the office with any questions, concerns, changes symptoms. ? ?Celesta Gentile, DPM ? ?

## 2021-10-28 ENCOUNTER — Inpatient Hospital Stay (HOSPITAL_COMMUNITY)
Admission: EM | Admit: 2021-10-28 | Discharge: 2021-11-05 | DRG: 516 | Disposition: A | Discharge status: Skilled Nursing Facility | Payer: Medicare Other | Attending: Internal Medicine | Admitting: Internal Medicine

## 2021-10-28 ENCOUNTER — Emergency Department (HOSPITAL_COMMUNITY): Payer: Medicare Other

## 2021-10-28 ENCOUNTER — Other Ambulatory Visit: Payer: Self-pay

## 2021-10-28 ENCOUNTER — Encounter (HOSPITAL_COMMUNITY): Payer: Self-pay

## 2021-10-28 DIAGNOSIS — I482 Chronic atrial fibrillation, unspecified: Secondary | ICD-10-CM | POA: Diagnosis present

## 2021-10-28 DIAGNOSIS — R52 Pain, unspecified: Secondary | ICD-10-CM | POA: Diagnosis not present

## 2021-10-28 DIAGNOSIS — Z7984 Long term (current) use of oral hypoglycemic drugs: Secondary | ICD-10-CM

## 2021-10-28 DIAGNOSIS — N189 Chronic kidney disease, unspecified: Secondary | ICD-10-CM | POA: Diagnosis present

## 2021-10-28 DIAGNOSIS — I129 Hypertensive chronic kidney disease with stage 1 through stage 4 chronic kidney disease, or unspecified chronic kidney disease: Secondary | ICD-10-CM | POA: Diagnosis present

## 2021-10-28 DIAGNOSIS — Z841 Family history of disorders of kidney and ureter: Secondary | ICD-10-CM

## 2021-10-28 DIAGNOSIS — Z8673 Personal history of transient ischemic attack (TIA), and cerebral infarction without residual deficits: Secondary | ICD-10-CM | POA: Diagnosis not present

## 2021-10-28 DIAGNOSIS — N179 Acute kidney failure, unspecified: Secondary | ICD-10-CM | POA: Diagnosis present

## 2021-10-28 DIAGNOSIS — W08XXXA Fall from other furniture, initial encounter: Secondary | ICD-10-CM | POA: Diagnosis present

## 2021-10-28 DIAGNOSIS — Z833 Family history of diabetes mellitus: Secondary | ICD-10-CM | POA: Diagnosis not present

## 2021-10-28 DIAGNOSIS — E039 Hypothyroidism, unspecified: Secondary | ICD-10-CM | POA: Diagnosis present

## 2021-10-28 DIAGNOSIS — Z8261 Family history of arthritis: Secondary | ICD-10-CM

## 2021-10-28 DIAGNOSIS — M4316 Spondylolisthesis, lumbar region: Secondary | ICD-10-CM | POA: Diagnosis not present

## 2021-10-28 DIAGNOSIS — F419 Anxiety disorder, unspecified: Secondary | ICD-10-CM | POA: Diagnosis present

## 2021-10-28 DIAGNOSIS — Z9181 History of falling: Secondary | ICD-10-CM | POA: Diagnosis not present

## 2021-10-28 DIAGNOSIS — S32000A Wedge compression fracture of unspecified lumbar vertebra, initial encounter for closed fracture: Secondary | ICD-10-CM | POA: Diagnosis present

## 2021-10-28 DIAGNOSIS — R262 Difficulty in walking, not elsewhere classified: Secondary | ICD-10-CM | POA: Diagnosis not present

## 2021-10-28 DIAGNOSIS — H3530 Unspecified macular degeneration: Secondary | ICD-10-CM | POA: Diagnosis present

## 2021-10-28 DIAGNOSIS — R2689 Other abnormalities of gait and mobility: Secondary | ICD-10-CM | POA: Diagnosis not present

## 2021-10-28 DIAGNOSIS — E1142 Type 2 diabetes mellitus with diabetic polyneuropathy: Secondary | ICD-10-CM | POA: Diagnosis present

## 2021-10-28 DIAGNOSIS — I4821 Permanent atrial fibrillation: Secondary | ICD-10-CM | POA: Diagnosis present

## 2021-10-28 DIAGNOSIS — E890 Postprocedural hypothyroidism: Secondary | ICD-10-CM | POA: Diagnosis present

## 2021-10-28 DIAGNOSIS — R296 Repeated falls: Secondary | ICD-10-CM | POA: Diagnosis present

## 2021-10-28 DIAGNOSIS — Z823 Family history of stroke: Secondary | ICD-10-CM

## 2021-10-28 DIAGNOSIS — M4856XA Collapsed vertebra, not elsewhere classified, lumbar region, initial encounter for fracture: Principal | ICD-10-CM | POA: Diagnosis present

## 2021-10-28 DIAGNOSIS — E1122 Type 2 diabetes mellitus with diabetic chronic kidney disease: Secondary | ICD-10-CM | POA: Diagnosis present

## 2021-10-28 DIAGNOSIS — Z9842 Cataract extraction status, left eye: Secondary | ICD-10-CM

## 2021-10-28 DIAGNOSIS — Z9841 Cataract extraction status, right eye: Secondary | ICD-10-CM

## 2021-10-28 DIAGNOSIS — Z79890 Hormone replacement therapy (postmenopausal): Secondary | ICD-10-CM

## 2021-10-28 DIAGNOSIS — R413 Other amnesia: Secondary | ICD-10-CM | POA: Diagnosis present

## 2021-10-28 DIAGNOSIS — I4891 Unspecified atrial fibrillation: Secondary | ICD-10-CM | POA: Diagnosis not present

## 2021-10-28 DIAGNOSIS — N2889 Other specified disorders of kidney and ureter: Secondary | ICD-10-CM | POA: Diagnosis present

## 2021-10-28 DIAGNOSIS — F32A Depression, unspecified: Secondary | ICD-10-CM | POA: Diagnosis present

## 2021-10-28 DIAGNOSIS — Z7401 Bed confinement status: Secondary | ICD-10-CM | POA: Diagnosis not present

## 2021-10-28 DIAGNOSIS — E538 Deficiency of other specified B group vitamins: Secondary | ICD-10-CM | POA: Diagnosis present

## 2021-10-28 DIAGNOSIS — M48061 Spinal stenosis, lumbar region without neurogenic claudication: Secondary | ICD-10-CM | POA: Diagnosis present

## 2021-10-28 DIAGNOSIS — K219 Gastro-esophageal reflux disease without esophagitis: Secondary | ICD-10-CM | POA: Diagnosis present

## 2021-10-28 DIAGNOSIS — R41841 Cognitive communication deficit: Secondary | ICD-10-CM | POA: Diagnosis not present

## 2021-10-28 DIAGNOSIS — S32000S Wedge compression fracture of unspecified lumbar vertebra, sequela: Secondary | ICD-10-CM | POA: Diagnosis not present

## 2021-10-28 DIAGNOSIS — G319 Degenerative disease of nervous system, unspecified: Secondary | ICD-10-CM | POA: Diagnosis not present

## 2021-10-28 DIAGNOSIS — M5410 Radiculopathy, site unspecified: Secondary | ICD-10-CM | POA: Diagnosis present

## 2021-10-28 DIAGNOSIS — I440 Atrioventricular block, first degree: Secondary | ICD-10-CM | POA: Diagnosis present

## 2021-10-28 DIAGNOSIS — S32030A Wedge compression fracture of third lumbar vertebra, initial encounter for closed fracture: Secondary | ICD-10-CM

## 2021-10-28 DIAGNOSIS — Z96651 Presence of right artificial knee joint: Secondary | ICD-10-CM | POA: Diagnosis present

## 2021-10-28 DIAGNOSIS — R1312 Dysphagia, oropharyngeal phase: Secondary | ICD-10-CM | POA: Diagnosis not present

## 2021-10-28 DIAGNOSIS — Z79899 Other long term (current) drug therapy: Secondary | ICD-10-CM

## 2021-10-28 DIAGNOSIS — G4700 Insomnia, unspecified: Secondary | ICD-10-CM | POA: Diagnosis present

## 2021-10-28 DIAGNOSIS — E785 Hyperlipidemia, unspecified: Secondary | ICD-10-CM | POA: Diagnosis present

## 2021-10-28 DIAGNOSIS — S0990XA Unspecified injury of head, initial encounter: Secondary | ICD-10-CM | POA: Diagnosis not present

## 2021-10-28 DIAGNOSIS — M6281 Muscle weakness (generalized): Secondary | ICD-10-CM | POA: Diagnosis not present

## 2021-10-28 DIAGNOSIS — Z961 Presence of intraocular lens: Secondary | ICD-10-CM | POA: Diagnosis present

## 2021-10-28 DIAGNOSIS — Z781 Physical restraint status: Secondary | ICD-10-CM

## 2021-10-28 DIAGNOSIS — E440 Moderate protein-calorie malnutrition: Secondary | ICD-10-CM | POA: Diagnosis present

## 2021-10-28 DIAGNOSIS — H409 Unspecified glaucoma: Secondary | ICD-10-CM | POA: Diagnosis present

## 2021-10-28 DIAGNOSIS — Z7901 Long term (current) use of anticoagulants: Secondary | ICD-10-CM

## 2021-10-28 DIAGNOSIS — M545 Low back pain, unspecified: Secondary | ICD-10-CM | POA: Diagnosis not present

## 2021-10-28 DIAGNOSIS — Z7952 Long term (current) use of systemic steroids: Secondary | ICD-10-CM

## 2021-10-28 DIAGNOSIS — R410 Disorientation, unspecified: Secondary | ICD-10-CM | POA: Diagnosis not present

## 2021-10-28 DIAGNOSIS — M549 Dorsalgia, unspecified: Secondary | ICD-10-CM | POA: Diagnosis not present

## 2021-10-28 DIAGNOSIS — I2510 Atherosclerotic heart disease of native coronary artery without angina pectoris: Secondary | ICD-10-CM | POA: Diagnosis present

## 2021-10-28 DIAGNOSIS — W19XXXA Unspecified fall, initial encounter: Secondary | ICD-10-CM | POA: Diagnosis not present

## 2021-10-28 DIAGNOSIS — Z8249 Family history of ischemic heart disease and other diseases of the circulatory system: Secondary | ICD-10-CM

## 2021-10-28 LAB — COMPREHENSIVE METABOLIC PANEL
ALT: 15 U/L (ref 0–44)
AST: 20 U/L (ref 15–41)
Albumin: 3.4 g/dL — ABNORMAL LOW (ref 3.5–5.0)
Alkaline Phosphatase: 82 U/L (ref 38–126)
Anion gap: 9 (ref 5–15)
BUN: 19 mg/dL (ref 8–23)
CO2: 24 mmol/L (ref 22–32)
Calcium: 8.8 mg/dL — ABNORMAL LOW (ref 8.9–10.3)
Chloride: 102 mmol/L (ref 98–111)
Creatinine, Ser: 1.42 mg/dL — ABNORMAL HIGH (ref 0.61–1.24)
GFR, Estimated: 47 mL/min — ABNORMAL LOW (ref >60)
Glucose, Bld: 176 mg/dL — ABNORMAL HIGH (ref 70–99)
Potassium: 4.4 mmol/L (ref 3.5–5.1)
Sodium: 135 mmol/L (ref 135–145)
Total Bilirubin: 1.5 mg/dL — ABNORMAL HIGH (ref 0.3–1.2)
Total Protein: 6.8 g/dL (ref 6.5–8.1)

## 2021-10-28 LAB — CBC WITH DIFFERENTIAL/PLATELET
Abs Immature Granulocytes: 0.05 10*3/uL (ref 0.00–0.07)
Basophils Absolute: 0 10*3/uL (ref 0.0–0.1)
Basophils Relative: 0 %
Eosinophils Absolute: 0 10*3/uL (ref 0.0–0.5)
Eosinophils Relative: 0 %
HCT: 42.1 % (ref 39.0–52.0)
Hemoglobin: 13.8 g/dL (ref 13.0–17.0)
Immature Granulocytes: 1 %
Lymphocytes Relative: 8 %
Lymphs Abs: 0.8 10*3/uL (ref 0.7–4.0)
MCH: 31.7 pg (ref 26.0–34.0)
MCHC: 32.8 g/dL (ref 30.0–36.0)
MCV: 96.6 fL (ref 80.0–100.0)
Monocytes Absolute: 0.8 10*3/uL (ref 0.1–1.0)
Monocytes Relative: 9 %
Neutro Abs: 7.8 10*3/uL — ABNORMAL HIGH (ref 1.7–7.7)
Neutrophils Relative %: 82 %
Platelets: 211 10*3/uL (ref 150–400)
RBC: 4.36 MIL/uL (ref 4.22–5.81)
RDW: 12.7 % (ref 11.5–15.5)
WBC: 9.6 10*3/uL (ref 4.0–10.5)
nRBC: 0 % (ref 0.0–0.2)

## 2021-10-28 LAB — CK: Total CK: 218 U/L (ref 49–397)

## 2021-10-28 LAB — GLUCOSE, CAPILLARY: Glucose-Capillary: 147 mg/dL — ABNORMAL HIGH (ref 70–99)

## 2021-10-28 LAB — CBG MONITORING, ED: Glucose-Capillary: 115 mg/dL — ABNORMAL HIGH (ref 70–99)

## 2021-10-28 MED ORDER — OXYCODONE HCL 5 MG PO TABS
5.0000 mg | ORAL_TABLET | ORAL | Status: DC | PRN
  Administered 2021-10-28 – 2021-10-31 (×7): 5 mg via ORAL
  Filled 2021-10-28 (×7): qty 1

## 2021-10-28 MED ORDER — LEVOTHYROXINE SODIUM 25 MCG PO TABS
125.0000 ug | ORAL_TABLET | Freq: Every day | ORAL | Status: DC
  Administered 2021-10-29 – 2021-11-05 (×8): 125 ug via ORAL
  Filled 2021-10-28 (×8): qty 1

## 2021-10-28 MED ORDER — FAMOTIDINE 20 MG PO TABS
20.0000 mg | ORAL_TABLET | Freq: Every day | ORAL | Status: DC
  Administered 2021-10-28 – 2021-11-05 (×9): 20 mg via ORAL
  Filled 2021-10-28 (×9): qty 1

## 2021-10-28 MED ORDER — METHOCARBAMOL 1000 MG/10ML IJ SOLN
500.0000 mg | Freq: Four times a day (QID) | INTRAVENOUS | Status: DC | PRN
  Administered 2021-10-29: 500 mg via INTRAVENOUS
  Filled 2021-10-28 (×2): qty 5

## 2021-10-28 MED ORDER — LIDOCAINE 5 % EX PTCH
1.0000 | MEDICATED_PATCH | CUTANEOUS | Status: DC
  Administered 2021-10-28 – 2021-11-04 (×5): 1 via TRANSDERMAL
  Filled 2021-10-28 (×7): qty 1

## 2021-10-28 MED ORDER — INSULIN ASPART 100 UNIT/ML IJ SOLN
0.0000 [IU] | Freq: Three times a day (TID) | INTRAMUSCULAR | Status: DC
  Administered 2021-10-29: 3 [IU] via SUBCUTANEOUS
  Administered 2021-10-30: 5 [IU] via SUBCUTANEOUS
  Administered 2021-10-30: 3 [IU] via SUBCUTANEOUS
  Administered 2021-10-31: 8 [IU] via SUBCUTANEOUS
  Administered 2021-10-31: 3 [IU] via SUBCUTANEOUS
  Administered 2021-11-01 (×2): 2 [IU] via SUBCUTANEOUS
  Administered 2021-11-01: 3 [IU] via SUBCUTANEOUS
  Administered 2021-11-02: 2 [IU] via SUBCUTANEOUS
  Administered 2021-11-02 (×2): 5 [IU] via SUBCUTANEOUS
  Administered 2021-11-03: 2 [IU] via SUBCUTANEOUS
  Administered 2021-11-03: 8 [IU] via SUBCUTANEOUS
  Administered 2021-11-04 (×2): 3 [IU] via SUBCUTANEOUS
  Administered 2021-11-04: 5 [IU] via SUBCUTANEOUS
  Administered 2021-11-05 (×2): 3 [IU] via SUBCUTANEOUS

## 2021-10-28 MED ORDER — ACETAMINOPHEN 325 MG PO TABS
650.0000 mg | ORAL_TABLET | Freq: Four times a day (QID) | ORAL | Status: DC | PRN
  Administered 2021-10-28 – 2021-11-02 (×7): 650 mg via ORAL
  Filled 2021-10-28 (×7): qty 2

## 2021-10-28 MED ORDER — APIXABAN 5 MG PO TABS
5.0000 mg | ORAL_TABLET | Freq: Two times a day (BID) | ORAL | Status: DC
  Administered 2021-10-28 – 2021-10-29 (×3): 5 mg via ORAL
  Filled 2021-10-28 (×3): qty 1

## 2021-10-28 MED ORDER — METOPROLOL SUCCINATE ER 25 MG PO TB24
25.0000 mg | ORAL_TABLET | Freq: Every morning | ORAL | Status: DC
  Administered 2021-10-29 – 2021-11-05 (×8): 25 mg via ORAL
  Filled 2021-10-28 (×8): qty 1

## 2021-10-28 MED ORDER — ONDANSETRON HCL 4 MG/2ML IJ SOLN: 4.0000 mg | Freq: Four times a day (QID) | INTRAMUSCULAR | Status: DC | PRN

## 2021-10-28 MED ORDER — SODIUM CHLORIDE 0.9% FLUSH
3.0000 mL | Freq: Two times a day (BID) | INTRAVENOUS | Status: DC
  Administered 2021-10-28 – 2021-11-05 (×16): 3 mL via INTRAVENOUS

## 2021-10-28 MED ORDER — LATANOPROST 0.005 % OP SOLN
1.0000 [drp] | Freq: Every day | OPHTHALMIC | Status: DC
  Administered 2021-10-28 – 2021-11-04 (×7): 1 [drp] via OPHTHALMIC
  Filled 2021-10-28: qty 2.5

## 2021-10-28 MED ORDER — HYDRALAZINE HCL 20 MG/ML IJ SOLN: 5.0000 mg | INTRAMUSCULAR | Status: DC | PRN

## 2021-10-28 MED ORDER — DOCUSATE SODIUM 100 MG PO CAPS
100.0000 mg | ORAL_CAPSULE | Freq: Two times a day (BID) | ORAL | Status: DC
  Administered 2021-10-28 – 2021-11-05 (×14): 100 mg via ORAL
  Filled 2021-10-28 (×14): qty 1

## 2021-10-28 MED ORDER — POLYETHYLENE GLYCOL 3350 17 G PO PACK: 17.0000 g | PACK | Freq: Every day | ORAL | Status: DC | PRN

## 2021-10-28 MED ORDER — AMIODARONE HCL 200 MG PO TABS
200.0000 mg | ORAL_TABLET | Freq: Every day | ORAL | Status: DC
  Administered 2021-10-28 – 2021-11-05 (×9): 200 mg via ORAL
  Filled 2021-10-28 (×9): qty 1

## 2021-10-28 MED ORDER — ONDANSETRON HCL 4 MG PO TABS: 4.0000 mg | ORAL_TABLET | Freq: Four times a day (QID) | ORAL | Status: DC | PRN

## 2021-10-28 MED ORDER — ACETAMINOPHEN 500 MG PO TABS
1000.0000 mg | ORAL_TABLET | Freq: Once | ORAL | Status: AC
  Administered 2021-10-28: 1000 mg via ORAL
  Filled 2021-10-28: qty 2

## 2021-10-28 MED ORDER — BISACODYL 5 MG PO TBEC: 5.0000 mg | DELAYED_RELEASE_TABLET | Freq: Every day | ORAL | Status: DC | PRN

## 2021-10-28 MED ORDER — ACETAMINOPHEN 650 MG RE SUPP
650.0000 mg | Freq: Four times a day (QID) | RECTAL | Status: DC | PRN
Start: 2021-10-28 — End: 2021-11-05

## 2021-10-28 NOTE — ED Notes (Signed)
Patient transported to CT 

## 2021-10-28 NOTE — ED Provider Notes (Addendum)
?[name] ?Provider Note ? ? ?CSN: [id number] ?Arrival date & time: 10/28/21  1136 ? ?  ? ?History ? ?Chief Complaint  ?Patient presents with  ? Fall  ? ? ?Stephen Wilkinson is a 88 y.o. male. ? ?Pt is a 88y/o male with hx of persistent Afib on plavix and eliquis, HTN, diabetes mellitus, CVA, cancer, CAD, HLD, hypothyroidism, CKD, balance/gait disorder, and First degree heart block who has been having more recurrent falls he reports over the last few months however comes to the emergency room today because he had 3 falls in the last 24 hours.  Patient reports he is not really sure why he is falling.  He does lose his balance but always seems to fall forward.  He reports it is because he worries and does not pay attention to what he is doing.  However he was unable to get up over the last few falls and last night fell off the couch and his wife did not want to call fire department again so she just put pillows around him throughout the night and called this morning.  He reports in the past he would have pain in his lower back but once he got up and started moving around it would always get better however since the fall was yesterday he has had an acute pain in his lower back that is a 9 out of 10 and worse with any movement.  He also reports feeling heaviness and weakness in his legs.  He denies any numbness.  He has no abdominal pain, chest pain, upper back pain or headache.  He does report he has memory issues but does not feel like that is any different.  He is not having any shortness of breath or palpitations today.  He cannot recall hitting his head during the falls yesterday.  He does usually get around his home with a walker. ? ?The history is provided by the patient, the EMS personnel and medical records.  ?Fall ? ? ?  ? ?Home Medications ?Prior to Admission medications   ?Medication Sig Start Date End Date Taking? Authorizing Provider  ?amiodarone (PACERONE) 200 MG tablet  Take 1 tablet (200 mg total) by mouth daily. 09/01/21  Yes [name], MD  ?apixaban (ELIQUIS) 5 MG TABS tablet Take 1 tablet (5 mg total) by mouth 2 (two) times daily. 09/11/21  Yes [name], MD  ?Bevacizumab (AVASTIN IV) Place 1 Syringe into the right eye See admin instructions. 1 dose injected in the right eye every 7 weeks   Yes [provider]  ?famotidine (PEPCID) 20 MG tablet Take 1 tablet (20 mg total) by mouth 2 (two) times daily. ?Patient taking differently: Take 20 mg by mouth in the morning. 04/10/20  Yes Biagio Borg, MD  ?latanoprost (XALATAN) 0.005 % ophthalmic solution Place 1 drop into both eyes at bedtime. 01/16/21 01/16/22 Yes Rankin, Clent Demark, MD  ?levothyroxine (SYNTHROID) 125 MCG tablet TAKE 1 TABLET EVERY DAY BEFORE BREAKFAST ?Patient taking differently: Take 125 mcg by mouth daily before breakfast. 06/24/21  Yes Biagio Borg, MD  ?metFORMIN (GLUCOPHAGE) 1000 MG tablet TAKE 1 TABLET EVERY DAY ?Patient taking differently: Take 1,000 mg by mouth daily with breakfast. 04/29/21  Yes Biagio Borg, MD  ?metoprolol succinate (TOPROL-XL) 25 MG 24 hr tablet Take 1 tablet (25 mg total) by mouth every morning. 05/30/21  Yes [name], MD  ?Multiple Vitamins-Minerals (ICAPS AREDS 2 PO) Take 2 capsules  by mouth daily.   Yes [provider]  ?glucose blood (ONE TOUCH ULTRA TEST) test strip Use as directed once daily to check blood sugar.  Diagnosis code E11.42 10/09/20   Biagio Borg, MD  ?predniSONE (DELTASONE) 10 MG tablet 3 tabs by mouth per day for 3 days,2tabs per day for 3 days,1tab per day for 3 days ?Patient not taking: Reported on 10/02/2021 08/26/21   Biagio Borg, MD  ?QUEtiapine (SEROQUEL) 25 MG tablet Take 1 tablet (25 mg total) by mouth at bedtime. ?Patient not taking: Reported on 08/28/2021 08/26/21   Biagio Borg, MD  ?traMADol (ULTRAM) 50 MG tablet Take 1 tablet (50 mg total) by mouth every 6 (six) hours as needed. ?Patient not taking: Reported on 08/28/2021  08/26/21   Biagio Borg, MD  ?   ? ?Allergies    ?Patient has no known allergies.   ? ?Review of Systems   ?Review of Systems ? ?Physical Exam ?Updated Vital Signs ?BP 139/85   Pulse 80   Temp 98.7 ?F (37.1 ?C) (Oral)   Resp (!) 21   Ht 6' 1.5" (1.867 m)   Wt 81.6 kg   SpO2 97%   BMI 23.43 kg/m?  ?Physical Exam ?Vitals and nursing note reviewed.  ?Constitutional:   ?   General: He is not in acute distress. ?   Appearance: He is well-developed.  ?HENT:  ?   Head: Normocephalic and atraumatic.  ?   Mouth/Throat:  ?   Mouth: Mucous membranes are dry.  ?Eyes:  ?   Conjunctiva/sclera: Conjunctivae normal.  ?   Pupils: Pupils are equal, round, and reactive to light.  ?Cardiovascular:  ?   Rate and Rhythm: Normal rate. Rhythm irregularly irregular.  ?   Pulses: Normal pulses.  ?   Heart sounds: Murmur heard.  ?Systolic murmur is present.  ?Pulmonary:  ?   Effort: Pulmonary effort is normal. No respiratory distress.  ?   Breath sounds: Normal breath sounds. No wheezing or rales.  ?Abdominal:  ?   General: There is no distension.  ?   Palpations: Abdomen is soft.  ?   Tenderness: There is no abdominal tenderness. There is no guarding or rebound.  ?Musculoskeletal:     ?   General: Tenderness present. Normal range of motion.  ?   Cervical back: Normal range of motion and neck supple. Bony tenderness present. No tenderness.  ?     Back: ? ?   Right lower leg: No edema.  ?   Left lower leg: No edema.  ?Skin: ?   General: Skin is warm and dry.  ?   Capillary Refill: Capillary refill takes less than 2 seconds.  ?   Findings: No erythema or rash.  ?Neurological:  ?   Mental Status: He is alert and oriented to person, place, and time.  ?   Comments: 4 out of 5 strength in bilateral lower extremities.  Positive straight leg raise bilaterally.  Sensation intact.  5 out of 5 strength in bilateral upper extremities.  Occasional word finding difficulty and stuttering speech  ?Psychiatric:     ?   Mood and Affect: Mood normal.      ?   Behavior: Behavior normal.  ? ? ?ED Results / Procedures / Treatments   ?Labs ?(all labs ordered are listed, but only abnormal results are displayed) ?Labs Reviewed  ?CBC WITH DIFFERENTIAL/PLATELET - Abnormal; Notable for the following components:  ?    Result Value  ?  Neutro Abs 7.8 (*)   ? All other components within normal limits  ?COMPREHENSIVE METABOLIC PANEL - Abnormal; Notable for the following components:  ? Glucose, Bld 176 (*)   ? Creatinine, Ser 1.42 (*)   ? Calcium 8.8 (*)   ? Albumin 3.4 (*)   ? Total Bilirubin 1.5 (*)   ? GFR, Estimated 47 (*)   ? All other components within normal limits  ?CK  ?URINALYSIS, ROUTINE W REFLEX MICROSCOPIC  ? ? ?EKG ?EKG Interpretation ? ?Date/Time:  Tuesday Oct 28 2021 11:52:04 EDT ?Ventricular Rate:  86 ?PR Interval:    ?QRS Duration: 106 ?QT Interval:  399 ?QTC Calculation: 478 ?R Axis:   -49 ?Text Interpretation: Atrial fibrillation Left anterior fascicular block Anterior infarct, old No significant change since last tracing Confirmed by Blanchie Dessert [phone]) on 10/28/2021 12:26:44 PM ? ?Radiology ?CT Head Wo Contrast ? ?Result Date: 10/28/2021 ?CLINICAL DATA:  Trauma slid off couch EXAM: CT HEAD WITHOUT CONTRAST TECHNIQUE: Contiguous axial images were obtained from the base of the skull through the vertex without intravenous contrast. RADIATION DOSE REDUCTION: This exam was performed according to the departmental dose-optimization program which includes automated exposure control, adjustment of the mA and/or kV according to patient size and/or use of iterative reconstruction technique. COMPARISON:  06/08/2014 correlation is also made with MRI head 11/02/2018 FINDINGS: Brain: No evidence of acute infarction, hemorrhage, cerebral edema, mass, mass effect, or midline shift. No hydrocephalus or extra-axial fluid collection. Generalized cerebral and cerebellar atrophy, with prominent ventricles. Periventricular white matter changes, likely the sequela of chronic  small vessel ischemic disease. Vascular: No hyperdense vessel. Skull: Normal. Negative for fracture or focal lesion. Sinuses/Orbits: Mucosal thickening in the left-greater-than-right maxillary sinus. Status post

## 2021-10-28 NOTE — ED Triage Notes (Signed)
Pt BIB [facility] from home c/o sliding off the couch 3 times yesterday but was unable to get up after the 3rd time and spent about 12 hrs on the floor. Pt is c/o lower back pain but also states that is chronic but is now worse since after sliding off the couch.  ? ? ?

## 2021-10-28 NOTE — ED Notes (Signed)
Got patient undressed on the monitor did ekg shown to Dr Theora Gianotti patient is resting with call bell in reach  ?

## 2021-10-28 NOTE — Progress Notes (Signed)
Orthopedic Tech Progress Note ?Patient Details:  ?Stephen Wilkinson ?[birth year] ?[id number] ? ?Ortho Devices ?Type of Ortho Device: Thoracolumbar corset (TLSO) ?Ortho Device/Splint Location: BACK ?Ortho Device/Splint Interventions: Ordered ?  ?Post Interventions ?Patient Tolerated: Well ?Instructions Provided: Care of device ? ?Janit Pagan ?10/28/2021, 5:26 PM ? ?

## 2021-10-28 NOTE — ED Notes (Addendum)
Ortho at bedside to drop off TLSO brace. Brace is at bedside with pt and will go up with pt when he goes upstairs.  ?

## 2021-10-28 NOTE — H&P (Signed)
?History and Physical  ? ? ?Patient: Stephen Wilkinson GLO:[id number] DOB: February 02, 1929 ?DOA: 10/28/2021 ?DOS: the patient was seen and examined on 10/28/2021 ?PCP: Biagio Borg, MD  ?Patient coming from: Home - lives with wife; NOK: Wife, Stephen Wilkinson, [phone] (he requests that I not call her today) ? ? ?Chief Complaint: Falls ? ?HPI: Stephen Wilkinson is a 88 y.o. male with medical history significant of DM; glaucoma; HTN; HLD; afib; and hypothyroidism presenting with recurrent falls.  He reports recent h/o afib and plan for repeat cardioversion in the future.  He has been falling recently and had a significant fall last Wednesday with a lumbar compression fracture.  Since then, he has had recurrent falls and LE weakness.  The pain is diffuse across his low back with some radicular pain along both legs.   ? ? ? ?ER Course:  3 falls in 24 hours, couldn't get up.  Likely multifactorial - macular degeneration, gait disturbance, prior back surgeries.  Has compression fracture which likely made for recurrent falls.  Still having pain.  Needs PT/OT.  Neurosurgery recommends TLSO. ? ? ? ? ?Review of Systems: As mentioned in the history of present illness. All other systems reviewed and are negative. ?Past Medical History:  ?Diagnosis Date  ? Anxiety   ? B12 deficiency 09/23/2016  ? Blood transfusion without reported diagnosis   ? Cancer [facility])   ? basal and squamous skin carcinoma   ? Cataract   ? Depression   ? Diabetes mellitus   ? GERD (gastroesophageal reflux disease)   ? Glaucoma 01/04/2013  ? Hx of transient ischemic attack (TIA) 01/04/2013  ? Hx: UTI (urinary tract infection)   ? Hyperlipidemia   ? Hypertension   ? Hypothyroidism   ? Insomnia   ? Macular degeneration   ? Neuropathy   ? OA (osteoarthritis)   ? Pneumonia   ? PVC's (premature ventricular contractions)   ? Renal mass   ? Stroke [facility])   ? mid 1990s   ? ?Past Surgical History:  ?Procedure Laterality Date  ? APPENDECTOMY    ? CARDIOVERSION N/A 07/30/2021  ? Procedure:  CARDIOVERSION;  Surgeon: Josue Hector, MD;  Location: [facility] ENDOSCOPY;  Service: Cardiovascular;  Laterality: N/A;  ? CATARACT EXTRACTION W/ INTRAOCULAR LENS  IMPLANT, BILATERAL    ? INGUINAL HERNIA REPAIR    ? X3  ? LUMBAR LAMINECTOMY/DECOMPRESSION MICRODISCECTOMY N/A 02/12/2017  ? Procedure: Lumbar Three-Four, Lumbar Four-Five, Lumbar Five-Sacral One Laminectomy and Foraminotomy;  Surgeon: Earnie Larsson, MD;  Location: [facility];  Service: Neurosurgery;  Laterality: N/A;  ? OTHER SURGICAL HISTORY    ? microwave procedure for prostate - 09/12   ? PROSTATE ABLATION    ? microwave  ? REPLACEMENT TOTAL KNEE    ? THYROIDECTOMY    ? TONSILLECTOMY AND ADENOIDECTOMY    ? TOTAL KNEE ARTHROPLASTY  05/11/2011  ? Procedure: TOTAL KNEE ARTHROPLASTY;  Surgeon: Gearlean Alf;  Location: [facility] ORS;  Service: Orthopedics;  Laterality: Right;  ? US ECHOCARDIOGRAPHY  11/10/2006  ? EF 55-60%  ? US ECHOCARDIOGRAPHY  09/20/2002  ? EF 65-70%  ? ?Social History:  reports that he has never smoked. He has never used smokeless tobacco. He reports current alcohol use of about 7.0 standard drinks per week. He reports that he does not use drugs. ? ?No Known Allergies ? ?Family History  ?Problem Relation Age of Onset  ? Hypertension Mother   ? Arthritis Mother   ? Diabetes Mother   ? Hypertension Father   ?  Stroke Father   ? Kidney disease Father   ? Hypertension Brother   ? Diabetes Brother   ? ? ?Prior to Admission medications   ?Medication Sig Start Date End Date Taking? Authorizing Provider  ?amiodarone (PACERONE) 200 MG tablet Take 1 tablet (200 mg total) by mouth daily. 09/01/21  Yes [name], MD  ?apixaban (ELIQUIS) 5 MG TABS tablet Take 1 tablet (5 mg total) by mouth 2 (two) times daily. 09/11/21  Yes [name], MD  ?Bevacizumab (AVASTIN IV) Place 1 Syringe into the right eye See admin instructions. 1 dose injected in the right eye every 7 weeks   Yes [provider]  ?famotidine (PEPCID) 20 MG tablet Take 1 tablet (20 mg  total) by mouth 2 (two) times daily. ?Patient taking differently: Take 20 mg by mouth in the morning. 04/10/20  Yes Biagio Borg, MD  ?latanoprost (XALATAN) 0.005 % ophthalmic solution Place 1 drop into both eyes at bedtime. 01/16/21 01/16/22 Yes Rankin, Clent Demark, MD  ?levothyroxine (SYNTHROID) 125 MCG tablet TAKE 1 TABLET EVERY DAY BEFORE BREAKFAST ?Patient taking differently: Take 125 mcg by mouth daily before breakfast. 06/24/21  Yes Biagio Borg, MD  ?metFORMIN (GLUCOPHAGE) 1000 MG tablet TAKE 1 TABLET EVERY DAY ?Patient taking differently: Take 1,000 mg by mouth daily with breakfast. 04/29/21  Yes Biagio Borg, MD  ?metoprolol succinate (TOPROL-XL) 25 MG 24 hr tablet Take 1 tablet (25 mg total) by mouth every morning. 05/30/21  Yes [name], MD  ?Multiple Vitamins-Minerals (ICAPS AREDS 2 PO) Take 2 capsules by mouth daily.   Yes [provider]  ?glucose blood (ONE TOUCH ULTRA TEST) test strip Use as directed once daily to check blood sugar.  Diagnosis code E11.42 10/09/20   Biagio Borg, MD  ?predniSONE (DELTASONE) 10 MG tablet 3 tabs by mouth per day for 3 days,2tabs per day for 3 days,1tab per day for 3 days ?Patient not taking: Reported on 10/02/2021 08/26/21   Biagio Borg, MD  ?QUEtiapine (SEROQUEL) 25 MG tablet Take 1 tablet (25 mg total) by mouth at bedtime. ?Patient not taking: Reported on 08/28/2021 08/26/21   Biagio Borg, MD  ?traMADol (ULTRAM) 50 MG tablet Take 1 tablet (50 mg total) by mouth every 6 (six) hours as needed. ?Patient not taking: Reported on 08/28/2021 08/26/21   Biagio Borg, MD  ? ? ?Physical Exam: ?Vitals:  ? 10/28/21 1300 10/28/21 1315 10/28/21 1345 10/28/21 1445  ?BP: (!) 143/99 139/85 125/82 114/85  ?Pulse: 96 80 87 80  ?Resp: (!) 21 (!) '21 16 19  '$ ?Temp:      ?TempSrc:      ?SpO2: 97% 97% 98% 97%  ?Weight:      ?Height:      ? ?General:  Appears calm and comfortable and is in NAD ?Eyes:  PERRL, EOMI, normal lids, iris ?ENT:  grossly normal hearing, lips & tongue,  mmm ?Neck:  no LAD, masses or thyromegaly ?Cardiovascular:  RRR, no m/r/g. Trace LE edema.  ?Respiratory:   CTA bilaterally with no wheezes/rales/rhonchi.  Normal respiratory effort. ?Abdomen:  soft, NT, ND ?Skin:  no rash or induration seen on limited exam ?Musculoskeletal:  grossly normal tone BUE/BLE with mildly decreased strength of B LE, good ROM, no bony abnormality ?Psychiatric:  grossly normal mood and affect, speech fluent and appropriate, AOx3 ?Neurologic:  CN 2-12 grossly intact, moves all extremities in coordinated fashion ? ? ?Radiological Exams on Admission: ?Independently reviewed - see discussion  in A/P where applicable ? ?CT Head Wo Contrast ? ?Result Date: 10/28/2021 ?CLINICAL DATA:  Trauma slid off couch EXAM: CT HEAD WITHOUT CONTRAST TECHNIQUE: Contiguous axial images were obtained from the base of the skull through the vertex without intravenous contrast. RADIATION DOSE REDUCTION: This exam was performed according to the departmental dose-optimization program which includes automated exposure control, adjustment of the mA and/or kV according to patient size and/or use of iterative reconstruction technique. COMPARISON:  06/08/2014 correlation is also made with MRI head 11/02/2018 FINDINGS: Brain: No evidence of acute infarction, hemorrhage, cerebral edema, mass, mass effect, or midline shift. No hydrocephalus or extra-axial fluid collection. Generalized cerebral and cerebellar atrophy, with prominent ventricles. Periventricular white matter changes, likely the sequela of chronic small vessel ischemic disease. Vascular: No hyperdense vessel. Skull: Normal. Negative for fracture or focal lesion. Sinuses/Orbits: Mucosal thickening in the left-greater-than-right maxillary sinus. Status post bilateral lens replacements. Other: The mastoid air cells are well aerated. IMPRESSION: No acute intracranial process. Electronically Signed   By: Merilyn Baba M.D.   On: 10/28/2021 12:32  ? ?CT Lumbar Spine Wo  Contrast ? ?Result Date: 10/28/2021 ?CLINICAL DATA:  Fall, lower back pain EXAM: CT LUMBAR SPINE WITHOUT CONTRAST TECHNIQUE: Multidetector CT imaging of the lumbar spine was performed without intravenous contra

## 2021-10-29 DIAGNOSIS — R296 Repeated falls: Secondary | ICD-10-CM | POA: Diagnosis not present

## 2021-10-29 LAB — GLUCOSE, CAPILLARY
Glucose-Capillary: 197 mg/dL — ABNORMAL HIGH (ref 70–99)
Glucose-Capillary: 177 mg/dL — ABNORMAL HIGH (ref 70–99)
Glucose-Capillary: 187 mg/dL — ABNORMAL HIGH (ref 70–99)
Glucose-Capillary: 167 mg/dL — ABNORMAL HIGH (ref 70–99)

## 2021-10-29 MED ORDER — QUETIAPINE FUMARATE 50 MG PO TABS
25.0000 mg | ORAL_TABLET | Freq: Every day | ORAL | Status: DC
  Administered 2021-10-29 – 2021-11-02 (×5): 25 mg via ORAL
  Filled 2021-10-29 (×5): qty 1

## 2021-10-29 MED ORDER — HALOPERIDOL LACTATE 5 MG/ML IJ SOLN
2.0000 mg | Freq: Four times a day (QID) | INTRAMUSCULAR | Status: DC | PRN
  Administered 2021-10-29 – 2021-11-03 (×2): 2 mg via INTRAVENOUS
  Filled 2021-10-29 (×2): qty 1

## 2021-10-29 NOTE — Progress Notes (Signed)
Initial Nutrition Assessment  DOCUMENTATION CODES:   Non-severe (moderate) malnutrition in context of chronic illness  INTERVENTION:   - Given malnutrition and advanced age, liberalize diet to Regular to promote PO intake and provide more food options  - Ensure Enlive po BID, each supplement provides 350 kcal and 20 grams of protein  - Magic Cup TID with meals, each supplement provides 290 kcal and 9 grams of protein  - MVI with minerals daily  NUTRITION DIAGNOSIS:   Moderate Malnutrition related to chronic illness as evidenced by moderate fat depletion, severe muscle depletion.  GOAL:   Patient will meet greater than or equal to 90% of their needs  MONITOR:   PO intake, Supplement acceptance, Labs, Weight trends  REASON FOR ASSESSMENT:   Consult Other (nutritional goals)  ASSESSMENT:   88 year old male who presented to the ED on 5/16 after a fall. PMH of T2DM, glaucoma, HLD, atrial fibrillation, hypothyroidism, basal and squamous skin carcinoma, anxiety, depression, GERD, HTN.  Discussed pt with NT prior to entering pt's room. NT reports pt consumed ~75% of dinner last night and fed himself. Pt required feeding assistance this morning but completed 100% of his breakfast meal tray.  Spoke with pt at bedside. Pt slightly confused, thinking he has met this RD previously and saying he was glad to see this RD again. Pt reports good appetite and endorses good PO intake. Pt unable to provide details regarding PO intake PTA or weight history. Reviewed weight history in chart. Pt with a 4.6 kg weight loss since 09/16/21. This is a 5.6% weight loss in 1.5 months which is not clinically significant for timeframe but is concerning given advanced age and NFPE findings. Pt meets criteria for malnutrition.  RD to liberalize diet to provide more food options and promote PO intake. Will also order oral nutrition supplements to aid pt in meeting kcal and protein needs. Will order daily MVI with  minerals.  Meal Completion: 50-100%  Medications reviewed and include: colace, pepcid, SSI  Labs reviewed: creatinine 1.26 CBG's: 166-187 x 24 hours  NUTRITION - FOCUSED PHYSICAL EXAM:  Flowsheet Row Most Recent Value  Orbital Region Severe depletion  Upper Arm Region Moderate depletion  Thoracic and Lumbar Region Moderate depletion  Buccal Region Moderate depletion  Temple Region Moderate depletion  Clavicle Bone Region Severe depletion  Clavicle and Acromion Bone Region Severe depletion  Scapular Bone Region Moderate depletion  Dorsal Hand Moderate depletion  Patellar Region Severe depletion  Anterior Thigh Region Severe depletion  Posterior Calf Region Severe depletion  Edema (RD Assessment) None  Hair Reviewed  Eyes Reviewed  Mouth Reviewed  Skin Reviewed  Nails Reviewed       Diet Order:   Diet Order             Diet regular Room service appropriate? Yes; Fluid consistency: Thin  Diet effective now                   EDUCATION NEEDS:   Not appropriate for education at this time  Skin:  Skin Assessment: Reviewed RN Assessment  Last BM:  10/28/21  Height:   Ht Readings from Last 1 Encounters:  10/28/21 6' 1.5" (1.867 m)    Weight:   Wt Readings from Last 1 Encounters:  10/28/21 77 kg    BMI:  Body mass index is 22.09 kg/m.  Estimated Nutritional Needs:   Kcal:  1700-1900  Protein:  85-100 grams  Fluid:  1.7-1.9 L    Anda Kraft  Vertell Limber, MS, RD, LDN Inpatient Clinical Dietitian Please see [facility] for contact information.

## 2021-10-29 NOTE — Progress Notes (Signed)
?PROGRESS NOTE ? ?Stephen Wilkinson  GHW:[id number] DOB: September 15, 1928 DOA: 10/28/2021 ?PCP: Biagio Borg, MD  ? ?Brief Narrative: ? ?Patient is a 88 year old male with history of diabetes type 2, glaucoma, hypertension, hyperlipidemia, paroxysmal A-fib, hypothyroidism who presented with recurrent falls from home.  Report of falling frequently, significant fall few days ago before admission with a lumbar compression fracture.  Since then more falls occurred and he was also complaining of lower extremity weakness.  CT lumbar spine showed  acute compression fracture of the L3 vertebral body up to ?approximately 10% loss of vertebral body height centrally.neurosurgery consulted on admission, recommended TLSO.  IR consulted for possible kyphoplasty.  PT/OT recommending/facility on discharge. ? ?Assessment & Plan: ? ?Principal Problem: ?  Recurrent falls ?Active Problems: ?  Lumbar compression fracture, closed, initial encounter ([place]) ?  Atrial fibrillation, chronic ([place]) ?  Hypothyroidism ?  Macular degeneration of both eyes ?  DM type 2 with diabetic peripheral neuropathy ([place]) ?  Memory loss ? ?Recurrent falls: Worsening.  Came from home.  Also complained of bilateral lower extremity weakness, gait disturbance.  Patient also has macular degeneration.  CT head did not show any acute intracranial findings.  PT/OT consulted and recommended skilled nursing facility on discharge. ? ?Back pain/compression fracture:  CT lumbar spine showed  acute compression fracture of the L3 vertebral body up to ?approximately 10% loss of vertebral body height centrally.neurosurgery consulted on admission, recommended TLSO.  IR consulted for possible kyphoplasty.  Imagings also showed previous compression fracture, other degenerative changes. ?Continue pain medication, supportive care. ? ?Permanent A-fib: Remains in A-fib but controlled rate on Toprol, amiodarone.  On Eliquis for anticoagulation.  If IR decides to do kyphoplasty, will hold  Eliquis ? ?Diabetes type 2: Recent hemoglobin of 7.4.  Takes metformin at home.  Currently on sliding scale insulin ? ?Hypertension: Continue Toprol ? ?Hypothyroidism: Continue Synthyroid ? ?AKI: Mild: Continue monitoring ? ?Glaucoma/macular degeneration: Might be contributing to the recurrent falls.  Receives Avastin injection as an outpatient.  Continue latanoprost ? ?Confusion/memory loss: Suspected to have memory impairment.  Currently confused, delirious.  Most likely W delirium.  Continue delirium precaution.  Started on low-dose Seroquel.  Continue IV Haldol for severe agitation.  Landscape architect. ? ?Goals of care: Remains full code for now.  Will discuss goals of care with family ? ? ? ? ?  ?  ? ?DVT prophylaxis: ?apixaban (ELIQUIS) tablet 5 mg  ? ?  Code Status: Full Code ? ?Family Communication: None at the bedside ? ?Patient status: Inpatient ? ?Patient is from : Home ? ?Anticipated discharge to: Skilled nursing facility ? ?Estimated DC date: Next year ? ? ?Consultants: IR ? ?Procedures: None yet ? ?Antimicrobials:  ?Anti-infectives (From admission, onward)  ? ? None  ? ?  ? ? ?Subjective: ? ?Patient seen and examined at the bedside this morning.  Hemodynamically stable, sitting in the chair.  Confused, mildly agitated.  Complains of back pain.  Relevant information could not be gathered due to his confusion ?Objective: ?Vitals:  ? 10/28/21 2052 10/28/21 2306 10/29/21 0255 10/29/21 0728  ?BP: (!) 150/91 (!) 149/92 (!) 117/97 (!) 160/98  ?Pulse: 85 91 (!) 105 95  ?Resp: '11 17 19 15  '$ ?Temp: 98.6 ?F (37 ?C) 98 ?F (36.7 ?C) 98 ?F (36.7 ?C) (!) 97.5 ?F (36.4 ?C)  ?TempSrc: Oral Oral Oral Oral  ?SpO2: 92% 92% 95% 96%  ?Weight: 77 kg     ?Height:      ? ? ?Intake/Output  Summary (Last 24 hours) at 10/29/2021 0934 ?Last data filed at 10/28/2021 2203 ?Gross per 24 hour  ?Intake 3 ml  ?Output --  ?Net 3 ml  ? ?Filed Weights  ? 10/28/21 1147 10/28/21 2052  ?Weight: 81.6 kg 77 kg  ? ? ?Examination: ? ?General exam:  Overall comfortable, mildly agitated, sitting in the chair, confused ?HEENT: PERRL ?Respiratory system:  no wheezes or crackles  ?Cardiovascular system: S1 & S2 heard, RRR.  ?Gastrointestinal system: Abdomen is nondistended, soft and nontender. ?Central nervous system: Alert and awake but not oriented ?Extremities: No edema, no clubbing ,no cyanosis, TLSO brace ?Skin: No rashes, no ulcers,no icterus   ? ? ?Data Reviewed: I have personally reviewed following labs and imaging studies ? ?CBC: ?Recent Labs  ?Lab 10/28/21 ?1226  ?WBC 9.6  ?NEUTROABS 7.8*  ?HGB 13.8  ?HCT 42.1  ?MCV 96.6  ?PLT 211  ? ?Basic Metabolic Panel: ?Recent Labs  ?Lab 10/28/21 ?1226  ?NA 135  ?K 4.4  ?CL 102  ?CO2 24  ?GLUCOSE 176*  ?BUN 19  ?CREATININE 1.42*  ?CALCIUM 8.8*  ? ? ? ?No results found for this or any previous visit (from the past 240 hour(s)).  ? ?Radiology Studies: ?CT Head Wo Contrast ? ?Result Date: 10/28/2021 ?CLINICAL DATA:  Trauma slid off couch EXAM: CT HEAD WITHOUT CONTRAST TECHNIQUE: Contiguous axial images were obtained from the base of the skull through the vertex without intravenous contrast. RADIATION DOSE REDUCTION: This exam was performed according to the departmental dose-optimization program which includes automated exposure control, adjustment of the mA and/or kV according to patient size and/or use of iterative reconstruction technique. COMPARISON:  06/08/2014 correlation is also made with MRI head 11/02/2018 FINDINGS: Brain: No evidence of acute infarction, hemorrhage, cerebral edema, mass, mass effect, or midline shift. No hydrocephalus or extra-axial fluid collection. Generalized cerebral and cerebellar atrophy, with prominent ventricles. Periventricular white matter changes, likely the sequela of chronic small vessel ischemic disease. Vascular: No hyperdense vessel. Skull: Normal. Negative for fracture or focal lesion. Sinuses/Orbits: Mucosal thickening in the left-greater-than-right maxillary sinus. Status post  bilateral lens replacements. Other: The mastoid air cells are well aerated. IMPRESSION: No acute intracranial process. Electronically Signed   By: Merilyn Baba M.D.   On: 10/28/2021 12:32  ? ?CT Lumbar Spine Wo Contrast ? ?Result Date: 10/28/2021 ?CLINICAL DATA:  Fall, lower back pain EXAM: CT LUMBAR SPINE WITHOUT CONTRAST TECHNIQUE: Multidetector CT imaging of the lumbar spine was performed without intravenous contrast administration. Multiplanar CT image reconstructions were also generated. RADIATION DOSE REDUCTION: This exam was performed according to the departmental dose-optimization program which includes automated exposure control, adjustment of the mA and/or kV according to patient size and/or use of iterative reconstruction technique. COMPARISON:  Lumbar spine MRI 11/28/2019 FINDINGS: Segmentation: Standard; the lowest formed disc space is designated L5-S1. Alignment: There is trace retrolisthesis of L2 on L3 and grade 1 anterolisthesis of L5 on S1, similar to the prior MRI. Alignment is otherwise normal. There is no evidence of traumatic malalignment. Vertebrae: Compression deformity of the L2 vertebral body with up to approximately 50% loss of vertebral body height is similar in extent compared to the MRI from 2021, with interval vertebral augmentation. There is an acute compression fracture of the L3 vertebral body with fracture planes extending from the anterior endplate to the superior endplate as well as into posterior endplate and bilateral pedicles (12-52, 12-32). There is no bony retropulsion or definite epidural hematoma. There is up to approximately 10% loss of  vertebral body height centrally. The other vertebral body heights are preserved. There are flowing osteophytes throughout the lower thoracic spine. The bones are diffusely demineralized. Paraspinal and other soft tissues: Coronary artery and aortic valve calcifications are noted. There is calcified atherosclerotic plaque throughout the  imaged aorta. There is fatty atrophy of the pancreas. Paraspinal soft tissues are unremarkable. Disc levels: T12-L1: No significant spinal canal or neural foraminal stenosis L1-L2: There is a mild disc bulge and mild b

## 2021-10-29 NOTE — Evaluation (Signed)
Physical Therapy Evaluation ?Patient Details ?Name: Stephen Wilkinson ?MRN: [id number] ?DOB: January 24, 1929 ?Today's Date: 10/29/2021 ? ?History of Present Illness ? 88 y.o. male [facility] to [facility] hospital on 10/28/2021 with recurrent falls and LE weakness. Pt diagnosed 5/10 with a lumbar compression fx. PMH includes DM; glaucoma; HTN; HLD; afib; and hypothyroidism.  ?Clinical Impression ? Pt presents to PT with deficits in strength, power, cognition, gait, balance, endurance, sensation, and vision. Pt with chronic vision and sensation deficits, greatly increasing his risk for falls. Pt does appears tremulous with mobility, depending on UE support of walker when ambulating. Pt bumps into doorway twice when ambulating and is often confused during session. Due to impaired cognition, vision, and sensation the pt remains at a high risk for falls. Pt's spouse reports she can not consistently provide supervision for the patient and has had difficulty managing physically with his recent falls. PT recommends SNF placement at this time in an effort to improve balance. Pt and family may need to consider ALF long term to improve safety.   ?   ? ?Recommendations for follow up therapy are one component of a multi-disciplinary discharge planning process, led by the attending physician.  Recommendations may be updated based on patient status, additional functional criteria and insurance authorization. ? ?Follow Up Recommendations Skilled nursing-short term rehab (<3 hours/day) (or home with HHPT and increased supervision from family or addition of extra caregiver support. May need to consider ALF long term) ? ?  ?Assistance Recommended at Discharge Frequent or constant Supervision/Assistance  ?Patient can return home with the following ? A lot of help with walking and/or transfers;A little help with bathing/dressing/bathroom;Assistance with cooking/housework;Direct supervision/assist for medications management;Direct supervision/assist for financial  management;Assist for transportation;Help with stairs or ramp for entrance ? ?  ?Equipment Recommendations None recommended by PT  ?Recommendations for Other Services ?    ?  ?Functional Status Assessment Patient has had a recent decline in their functional status and demonstrates the ability to make significant improvements in function in a reasonable and predictable amount of time.  ? ?  ?Precautions / Restrictions Precautions ?Precautions: Fall;Back ?Precaution Booklet Issued: Yes (comment) ?Precaution Comments: compression fx, TLSO at all times other than when showering ?Required Braces or Orthoses: Spinal Brace ?Spinal Brace: Thoracolumbosacral orthotic;Applied in sitting position ?Restrictions ?Weight Bearing Restrictions: No  ? ?  ? ?Mobility ? Bed Mobility ?Overal bed mobility: Needs Assistance ?Bed Mobility: Rolling, Sidelying to Sit ?Rolling: Min assist ?Sidelying to sit: Min guard ?  ?  ?  ?General bed mobility comments: verbal cues for technique ?  ? ?Transfers ?Overall transfer level: Needs assistance ?Equipment used: Rolling walker (2 wheels) ?Transfers: Sit to/from Stand ?Sit to Stand: Min guard ?  ?  ?  ?  ?  ?  ?  ? ?Ambulation/Gait ?Ambulation/Gait assistance: Min guard ?Gait Distance (Feet): 80 Feet ?Assistive device: Rolling walker (2 wheels) ?Gait Pattern/deviations: Step-through pattern ?Gait velocity: reduced ?Gait velocity interpretation: <1.8 ft/sec, indicate of risk for recurrent falls ?  ?General Gait Details: pt with slowed step-through gait, pt bumps into doorway twice when ambulating, difficulty maneuvering around objects ? ?Stairs ?  ?  ?  ?  ?  ? ?Wheelchair Mobility ?  ? ?Modified Rankin (Stroke Patients Only) ?  ? ?  ? ?Balance Overall balance assessment: Needs assistance ?Sitting-balance support: No upper extremity supported, Feet supported ?Sitting balance-Leahy Scale: Fair ?  ?  ?Standing balance support: Bilateral upper extremity supported, Reliant on assistive device for  balance ?Standing balance-Leahy Scale:  Poor ?  ?  ?  ?  ?  ?  ?  ?  ?  ?  ?  ?  ?   ? ? ? ?Pertinent Vitals/Pain Pain Assessment ?Pain Assessment: Faces ?Faces Pain Scale: Hurts little more ?Pain Location: back ?Pain Descriptors / Indicators: Grimacing ?Pain Intervention(s): Monitored during session  ? ? ?Home Living Family/patient expects to be discharged to:: Private residence ?Living Arrangements: Spouse/significant other ?Available Help at Discharge: Family;Available PRN/intermittently ?Type of Home: House ?Home Access: Stairs to enter ?Entrance Stairs-Rails: None ?Entrance Stairs-Number of Steps: 3 ?  ?Home Layout: One level ?Home Equipment: Conservation officer, nature (2 wheels);Cane - single point ?Additional Comments: pt is confused, spouse provides history over phone  ?  ?Prior Function Prior Level of Function : Independent/Modified Independent ?  ?  ?  ?  ?  ?  ?Mobility Comments: spouse reports the pt was independent with mobility until ~1 week ago when he began falling. PRN utilization of cane vs walker ?  ?  ? ? ?Hand Dominance  ?   ? ?  ?Extremity/Trunk Assessment  ? Upper Extremity Assessment ?Upper Extremity Assessment: Overall WFL for tasks assessed ?  ? ?Lower Extremity Assessment ?Lower Extremity Assessment: Generalized weakness;RLE deficits/detail;LLE deficits/detail ?RLE Sensation: history of peripheral neuropathy ?LLE Sensation: history of peripheral neuropathy ?  ? ?Cervical / Trunk Assessment ?Cervical / Trunk Assessment: Kyphotic  ?Communication  ? Communication: No difficulties  ?Cognition Arousal/Alertness: Awake/alert ?Behavior During Therapy: [facility] for tasks assessed/performed ?Overall Cognitive Status: Impaired/Different from baseline ?Area of Impairment: Orientation, Attention, Memory, Following commands, Safety/judgement, Awareness, Problem solving ?  ?  ?  ?  ?  ?  ?  ?  ?Orientation Level: Disoriented to, Time ?Current Attention Level: Focused ?Memory: Decreased recall of precautions, Decreased  short-term memory ?Following Commands: Follows one step commands consistently ?Safety/Judgement: Decreased awareness of safety, Decreased awareness of deficits ?Awareness: Intellectual ?Problem Solving: Requires verbal cues ?  ?  ?  ? ?  ?General Comments General comments (skin integrity, edema, etc.): tachycardia into 140s with mobility, pt with macular degeneration limiting his ability to read and impairing far-sight vision as well. Pt is able to discern between objects in path in hall, however does bump into multiple objects ? ?  ?Exercises    ? ?Assessment/Plan  ?  ?PT Assessment Patient needs continued PT services  ?PT Problem List Decreased strength;Decreased activity tolerance;Decreased balance;Decreased mobility;Decreased cognition;Decreased knowledge of use of DME;Decreased safety awareness;Decreased knowledge of precautions;Cardiopulmonary status limiting activity;Pain;Impaired sensation ? ?   ?  ?PT Treatment Interventions DME instruction;Gait training;Stair training;Functional mobility training;Therapeutic activities;Therapeutic exercise;Balance training;Neuromuscular re-education;Cognitive remediation;Patient/family education   ? ?PT Goals (Current goals can be found in the Care Plan section)  ?Acute Rehab PT Goals ?Patient Stated Goal: to stop falling ?PT Goal Formulation: With patient ?Time For Goal Achievement: 11/12/21 ?Potential to Achieve Goals: Fair ?Additional Goals ?Additional Goal #1: Pt will score >45/56 on the BERG to indicate a reduced risk for falls ? ?  ?Frequency Min 3X/week ?  ? ? ?Co-evaluation   ?  ?  ?  ?  ? ? ?  ?AM-PAC PT "6 Clicks" Mobility  ?Outcome Measure Help needed turning from your back to your side while in a flat bed without using bedrails?: A Little ?Help needed moving from lying on your back to sitting on the side of a flat bed without using bedrails?: A Little ?Help needed moving to and from a bed to a chair (including a wheelchair)?: A Little ?Help  needed standing up  from a chair using your arms (e.g., wheelchair or bedside chair)?: A Little ?Help needed to walk in hospital room?: A Little ?Help needed climbing 3-5 steps with a railing? : Total ?6 Click Score: 16 ? ?  ?End

## 2021-10-29 NOTE — Progress Notes (Signed)
IR consult received for potential KP on L3 fracture after fall. ?Chart reviewed. ?Pt is on Eliquis. ?Will have Dr. Gerhard Perches review for candidacy and/or further imaging such as MRI. ?Agree that Eliquis will need to be held 2 days prior to procedure. ?Formal consult to follow. ? ?Ascencion Dike PA-C ?Interventional Radiology ?10/29/2021 ?5:02 PM ? ?

## 2021-10-29 NOTE — TOC CAGE-AID Note (Signed)
Transition of Care (TOC) - CAGE-AID Screening ? ? ?Patient Details  ?Name: Najeh Credit ?MRN: [id number] ?Date of Birth: 21-Dec-1928 ? ?Transition of Care (TOC) CM/SW Contact:    ?Kenyon Eichelberger C Tarpley-Carter, LCSWA ?Phone Number: ?10/29/2021, 9:36 AM ? ? ?Clinical Narrative: ?Pt participated in [place].  Pt stated he does not use substance, but drinks ETOH socially.  Pt was  offered resources, due to usage of ETOH. ? ?Passenger transport manager, MSW, LCSW-A ?Pronouns:  She/Her/Hers ?[facility] HealthTransitions of Care ?Clinical Social Worker ?Direct Number:  [phone] ?[email or web address] ? ?CAGE-AID Screening: ?  ? ?Have You Ever Felt You Ought to Cut Down on Your Drinking or Drug Use?: No ?Have People Annoyed You By Critizing Your Drinking Or Drug Use?: No ?Have You Felt Bad Or Guilty About Your Drinking Or Drug Use?: No ?Have You Ever Had a Drink or Used Drugs First Thing In The Morning to Steady Your Nerves or to Get Rid of a Hangover?: No ?CAGE-AID Score: 0 ? ?Substance Abuse Education Offered: Yes ? ?Substance abuse interventions: Educational Materials ? ? ? ? ? ? ?

## 2021-10-29 NOTE — Evaluation (Signed)
Occupational Therapy Evaluation ?Patient Details ?Name: Stephen Wilkinson ?MRN: [id number] ?DOB: 1928-11-07 ?Today's Date: 10/29/2021 ? ? ?History of Present Illness 88 y.o. male presents to [facility] hospital on 10/28/2021 with recurrent falls and LE weakness. Pt diagnosed 5/10 with a lumbar compression fx. PMH includes DM; glaucoma; HTN; HLD; afib; and hypothyroidism.  ? ?Clinical Impression ?  ?Daray was evaluated s/p the above admission list. He is generally indep at baseline and lives with his wife who reports she is unable to physically assist him at d/c. Per pt's wife, he usually walks without AD and is oriented, the falls and confusion are new. Upon evaluation pt was oriented to self and year only, and remains confused and verbose throughout. He required min A for ambulation and transfers for safety, impulsivity and fall prevention. Pt had no recall of back precautions despite education throughout, and needs min A for UB ADLs and mod A for LB ADLs. Pt also has poor vision at baseline. OT to continue to follow. Recommend SNF at d/c for safety.   ?   ? ?Recommendations for follow up therapy are one component of a multi-disciplinary discharge planning process, led by the attending physician.  Recommendations may be updated based on patient status, additional functional criteria and insurance authorization.  ? ?Follow Up Recommendations ? Skilled nursing-short term rehab (<3 hours/day)  ?  ?Assistance Recommended at Discharge Frequent or constant Supervision/Assistance  ?Patient can return home with the following A lot of help with walking and/or transfers;A lot of help with bathing/dressing/bathroom;Direct supervision/assist for medications management;Assist for transportation;Help with stairs or ramp for entrance ? ?  ?Functional Status Assessment ? Patient has had a recent decline in their functional status and demonstrates the ability to make significant improvements in function in a reasonable and predictable amount of time.   ?Equipment Recommendations ? Tub/shower seat  ?  ?   ?Precautions / Restrictions Precautions ?Precautions: Fall;Back ?Precaution Booklet Issued: Yes (comment) ?Precaution Comments: compression fx, TLSO at all times other than when showering ?Required Braces or Orthoses: Spinal Brace ?Spinal Brace: Thoracolumbosacral orthotic;Applied in sitting position ?Restrictions ?Weight Bearing Restrictions: No  ? ?  ? ?Mobility Bed Mobility ?Overal bed mobility: Needs Assistance ?  ?  ?  ?  ?  ?  ?General bed mobility comments: OOB upon arrival ?  ? ?Transfers ?Overall transfer level: Needs assistance ?Equipment used: Rolling walker (2 wheels) ?Transfers: Sit to/from Stand ?Sit to Stand: Min assist ?  ?  ?  ?  ?  ?General transfer comment: min G- min , pt attempting to sit on chair and toilet prematurely required min A for cues and physical assist to prevent a fall ?  ? ?  ?Balance Overall balance assessment: Needs assistance ?Sitting-balance support: No upper extremity supported, Feet supported ?Sitting balance-Leahy Scale: Fair ?Sitting balance - Comments: balanced on toiet unsupported ?  ?Standing balance support: Bilateral upper extremity supported, Reliant on assistive device for balance ?Standing balance-Leahy Scale: Poor ?  ?  ?  ?  ?  ?  ?  ?  ?  ?  ?  ?  ?   ? ?ADL either performed or assessed with clinical judgement  ? ?ADL Overall ADL's : Needs assistance/impaired ?Eating/Feeding: Independent;Sitting ?  ?Grooming: Min guard;Standing ?  ?Upper Body Bathing: Min guard;Sitting ?  ?Lower Body Bathing: Moderate assistance;Sit to/from stand ?  ?Upper Body Dressing : Minimal assistance;Sitting ?  ?Lower Body Dressing: Moderate assistance;Sit to/from stand ?  ?Toilet Transfer: Minimal Teacher, English as a foreign language;Ambulation;Rolling walker (2 wheels) ?  Toilet Transfer Details (indicate cue type and reason): impulsive and requires cues ?Toileting- Clothing Manipulation and Hygiene: Min guard;Sitting/lateral lean ?  ?  ?   ?Functional mobility during ADLs: Minimal assistance;Rolling walker (2 wheels) ?General ADL Comments: confused, impulsive, generally weak, poor activity tolerance  ? ? ? ?Vision Baseline Vision/History: 0 No visual deficits;6 Macular Degeneration ?Ability to See in Adequate Light: 1 Impaired ?Patient Visual Report: No change from baseline ?Vision Assessment?: No apparent visual deficits;Vision impaired- to be further tested in functional context ?Additional Comments: macular dengeneration - not following cues well enough for assessment  ?   ?   ?   ? ?Pertinent Vitals/Pain Pain Assessment ?Faces Pain Scale: Hurts a little bit ?Pain Location: back ?Pain Descriptors / Indicators: Grimacing ?Pain Intervention(s): Limited activity within patient's tolerance, Monitored during session  ? ? ? ?Hand Dominance Right ?  ?Extremity/Trunk Assessment Upper Extremity Assessment ?Upper Extremity Assessment: Generalized weakness ?  ?Lower Extremity Assessment ?Lower Extremity Assessment: Defer to PT evaluation ?RLE Sensation: history of peripheral neuropathy ?LLE Sensation: history of peripheral neuropathy ?  ?Cervical / Trunk Assessment ?Cervical / Trunk Assessment: Kyphotic ?  ?Communication Communication ?Communication: Expressive difficulties (word finding and verbose) ?  ?Cognition Arousal/Alertness: Awake/alert ?Behavior During Therapy: Impulsive ?Overall Cognitive Status: Impaired/Different from baseline ?Area of Impairment: Orientation, Attention, Memory, Following commands, Safety/judgement, Awareness, Problem solving ?  ?  ?  ?  ?  ?  ?  ?  ?Orientation Level: Disoriented to, Place, Time, Situation ?Current Attention Level: Focused ?Memory: Decreased recall of precautions, Decreased short-term memory ?Following Commands: Follows one step commands with increased time ?Safety/Judgement: Decreased awareness of safety, Decreased awareness of deficits ?Awareness: Intellectual ?Problem Solving: Requires verbal cues ?General  Comments: Pt confused throughout and difficult to re-direct/orient. Verbose. Impulsive. ?  ?  ?General Comments  tachy with HR in 130s during mobility - very confused throughout ? ?  ?   ?   ? ? ?Home Living Family/patient expects to be discharged to:: Private residence ?Living Arrangements: Spouse/significant other ?Available Help at Discharge: Family;Available PRN/intermittently ?Type of Home: House ?Home Access: Stairs to enter ?Entrance Stairs-Number of Steps: 3 ?Entrance Stairs-Rails: None ?Home Layout: One level ?  ?  ?  ?  ?  ?  ?  ?Home Equipment: Conservation officer, nature (2 wheels);Cane - single point ?  ?Additional Comments: pt is confused, spouse provides history over phone ? Lives With: Spouse ? ?  ?Prior Functioning/Environment Prior Level of Function : Independent/Modified Independent ?  ?  ?  ?  ?  ?  ?Mobility Comments: spouse reports the pt was independent with mobility until ~1 week ago when he began falling. PRN utilization of cane vs walker ?  ?  ? ?  ?  ?OT Problem List: Decreased strength;Decreased range of motion;Impaired balance (sitting and/or standing);Decreased activity tolerance;Impaired vision/perception;Decreased cognition;Decreased safety awareness;Pain ?  ?   ?OT Treatment/Interventions: Self-care/ADL training;Therapeutic exercise;DME and/or AE instruction;Therapeutic activities;Patient/family education;Balance training  ?  ?OT Goals(Current goals can be found in the care plan section) Acute Rehab OT Goals ?Patient Stated Goal: unable to state ?OT Goal Formulation: With patient ?Time For Goal Achievement: 11/12/21 ?Potential to Achieve Goals: Good ?ADL Goals ?Pt Will Perform Lower Body Bathing: with modified independence;sit to/from stand ?Pt Will Perform Lower Body Dressing: with supervision;sit to/from stand ?Pt Will Transfer to Toilet: with supervision;ambulating ?Additional ADL Goal #1: Pt will indep recall and adhere to back precautions 100% of the session  ?OT Frequency: Min 2X/week ?   ? ?   ?  AM-PAC OT "6 Clicks" Daily Activity     ?Outcome Measure Help from another person eating meals?: None ?Help from another person taking care of personal grooming?: A Little ?Help from another person toileting,

## 2021-10-29 NOTE — TOC Progression Note (Signed)
Transition of Care (TOC) - Progression Note  ? ? ?Patient Details  ?Name: Stephen Wilkinson ?MRN: [id number] ?Date of Birth: 1929-02-11 ? ?Transition of Care (TOC) CM/SW Contact  ?Angelita Ingles, RN ?Phone Number:[phone] ? ?10/29/2021, 8:22 AM ? ?Clinical Narrative:    ?TOC acknowledges consult for home health and DME needs. Will follow and await recommendations per PT/OT. ? ? ?  ?  ? ?Expected Discharge Plan and Services ?  ?  ?  ?  ?  ?                ?  ?  ?  ?  ?  ?  ?  ?  ?  ?  ? ? ?Social Determinants of Health (SDOH) Interventions ?  ? ?Readmission Risk Interventions ?   ? View : No data to display.  ?  ?  ?  ? ? ?

## 2021-10-29 NOTE — Evaluation (Signed)
Speech Language Pathology Evaluation ?Patient Details ?Name: Stephen Wilkinson ?MRN: [id number] ?DOB: 10/15/1928 ?Today's Date: 10/29/2021 ?Time: 5916-3846 ?SLP Time Calculation (min) (ACUTE ONLY): 15 min ? ?Problem List:  ?Patient Active Problem List  ? Diagnosis Date Noted  ? Lumbar compression fracture, closed, initial encounter ([place]) 10/28/2021  ? Atrial fibrillation, chronic ([place]) 10/28/2021  ? Epiretinal membrane, right eye 09/24/2021  ? Epistaxis   ? Memory loss 08/29/2021  ? Hallucinations 08/29/2021  ? Acute gouty arthritis 08/29/2021  ? Vitamin D deficiency 08/29/2021  ? Hypotension 05/20/2021  ? Fall 05/20/2021  ? Aortic atherosclerosis ([place]) 10/09/2020  ? Excessive cerumen in both ear canals 04/10/2020  ? History of vitrectomy 12/04/2019  ? Bilateral hip pain 11/26/2019  ? Tremor 11/26/2019  ? Low back pain 11/23/2019  ? Gait disorder 11/23/2019  ? Recurrent falls 11/23/2019  ? General weakness 11/23/2019  ? Exudative age-related macular degeneration of right eye with active choroidal neovascularization ([place]) 10/16/2019  ? Advanced nonexudative age-related macular degeneration of left eye with subfoveal involvement 10/16/2019  ? Advanced nonexudative age-related macular degeneration of right eye without subfoveal involvement 10/16/2019  ? Floater, vitreous, right 10/16/2019  ? Epiretinal membrane, left eye 10/16/2019  ? Hallucination 02/21/2019  ? Balance disorder 09/30/2018  ? Dizziness 09/30/2018  ? Anxiety 09/25/2017  ? Lumbar stenosis with neurogenic claudication 02/12/2017  ? Lumbar spinal stenosis 11/24/2016  ? Left sided sciatica 11/24/2016  ? Renal cyst 11/24/2016  ? Bilateral leg cramps 09/23/2016  ? Nasal sore 09/23/2016  ? Vitamin B 12 deficiency 09/23/2016  ? CMC arthritis, thumb, degenerative 05/26/2016  ? Memory dysfunction 03/24/2016  ? Rash and nonspecific skin eruption 11/20/2014  ? Cough 10/10/2014  ? Sciatica of left side 08/21/2014  ? GERD (gastroesophageal reflux disease) 01/04/2014  ? Hearing  loss, bilateral 10/18/2013  ? Wrist arthritis 10/18/2013  ? Preventative health care 03/30/2013  ? Impaired vision in both eyes 03/30/2013  ? Erectile dysfunction 03/30/2013  ? Skin cancer   ? Hyperlipidemia   ? Chronic cough 03/10/2013  ? First degree heart block 01/04/2013  ? DM type 2 with diabetic peripheral neuropathy ([place]) 01/04/2013  ? BPH (benign prostatic hyperplasia) 01/04/2013  ? Hx of transient ischemic attack (TIA) 01/04/2013  ? Glaucoma 01/04/2013  ? Osteoarthritis of right knee 05/12/2011  ? Hypothyroidism 12/25/2010  ? Benign hypertensive heart disease without heart failure 12/25/2010  ? Anxiety and depression 12/25/2010  ? Macular degeneration of both eyes 12/25/2010  ? ?Past Medical History:  ?Past Medical History:  ?Diagnosis Date  ? Anxiety   ? B12 deficiency 09/23/2016  ? Blood transfusion without reported diagnosis   ? Cancer [facility])   ? basal and squamous skin carcinoma   ? Cataract   ? Depression   ? Diabetes mellitus   ? GERD (gastroesophageal reflux disease)   ? Glaucoma 01/04/2013  ? Hx of transient ischemic attack (TIA) 01/04/2013  ? Hx: UTI (urinary tract infection)   ? Hyperlipidemia   ? Hypertension   ? Hypothyroidism   ? Insomnia   ? Macular degeneration   ? Neuropathy   ? OA (osteoarthritis)   ? Pneumonia   ? PVC's (premature ventricular contractions)   ? Renal mass   ? Stroke [facility])   ? mid 1990s   ? ?Past Surgical History:  ?Past Surgical History:  ?Procedure Laterality Date  ? APPENDECTOMY    ? CARDIOVERSION N/A 07/30/2021  ? Procedure: CARDIOVERSION;  Surgeon: Josue Hector, MD;  Location: [place];  Service: Cardiovascular;  Laterality: N/A;  ? CATARACT EXTRACTION W/ INTRAOCULAR LENS  IMPLANT, BILATERAL    ? INGUINAL HERNIA REPAIR    ? X3  ? LUMBAR LAMINECTOMY/DECOMPRESSION MICRODISCECTOMY N/A 02/12/2017  ? Procedure: Lumbar Three-Four, Lumbar Four-Five, Lumbar Five-Sacral One Laminectomy and Foraminotomy;  Surgeon: Earnie Larsson, MD;  Location: [place];  Service: Neurosurgery;   Laterality: N/A;  ? OTHER SURGICAL HISTORY    ? microwave procedure for prostate - 09/12   ? PROSTATE ABLATION    ? microwave  ? REPLACEMENT TOTAL KNEE    ? THYROIDECTOMY    ? TONSILLECTOMY AND ADENOIDECTOMY    ? TOTAL KNEE ARTHROPLASTY  05/11/2011  ? Procedure: TOTAL KNEE ARTHROPLASTY;  Surgeon: Gearlean Alf;  Location: [facility];  Service: Orthopedics;  Laterality: Right;  ? US ECHOCARDIOGRAPHY  11/10/2006  ? EF 55-60%  ? US ECHOCARDIOGRAPHY  09/20/2002  ? EF 65-70%  ? ?HPI:  ?Pt is a 88 y.o. male who presented to [facility] hospital on 10/28/21 with recurrent falls and LE weakness. CT head negative. Pt diagnosed 5/10 with a lumbar compression fx. PMH: DM; glaucoma, HTN, HLD, afib, and hypothyroidism.  ? ?Assessment / Plan / Recommendation ?Clinical Impression ? Pt participated in speech-language-cognition evaluation. Pt reported that he has some "confusion" at baseline, but that it is much worse since the fall and is now "out of control". Pt's wife was contacted via phone for further information regarding pt's baseline. She reported that the pt is a [employer] and retired as the Engineer, maintenance for [employer]. She stated that the pt has had some "mental issues" for the past six months which she described as difficulty with memory and confusion. However, per the wife, the pt was able to independently manage his medications up until this admission, and was she only had to take over financial management within the last week. Pt was unable to participate in formal cognitive-linguistic testing due to the nature and severity of pt's cognitive-linguistic impairments. Pt exhibited difficulty in the areas of awareness, attention, memory, orientation, and problem solving. Pt's verbal output was verbose and he demonstrated difficulty with global cohesion, turn taking, and coherence. Based on conversation with pt's wife, pt's cognitive impairments appear to be acutely worse, and skilled SLP services are clinically indicated  at this time. ?   ?SLP Assessment ? SLP Recommendation/Assessment: Patient needs continued [facility] ?SLP Visit Diagnosis: Cognitive communication deficit (R41.841)  ?  ?Recommendations for follow up therapy are one component of a multi-disciplinary discharge planning process, led by the attending physician.  Recommendations may be updated based on patient status, additional functional criteria and insurance authorization. ?   ?Follow Up Recommendations ? Skilled nursing-short term rehab (<3 hours/day)  ?  ?Assistance Recommended at Discharge ? Frequent or constant Supervision/Assistance  ?Functional Status Assessment Patient has had a recent decline in their functional status and demonstrates the ability to make significant improvements in function in a reasonable and predictable amount of time.  ?Frequency and Duration min 2x/week  ?2 weeks ?  ?   ?SLP Evaluation ?Cognition ? Overall Cognitive Status: Impaired/Different from baseline ?Arousal/Alertness: Awake/alert ?Orientation Level: Oriented to person;Oriented to place;Oriented to situation;Disoriented to time ?Year: 2023 ?Month: April ?Day of Week: Incorrect (8th) ?Attention: Focused;Sustained ?Focused Attention: Impaired ?Focused Attention Impairment: Verbal basic ?Sustained Attention: Impaired ?Sustained Attention Impairment: Verbal basic ?Memory: Impaired ?Awareness: Impaired ?Awareness Impairment: Emergent impairment ?Problem Solving: Impaired ?Problem Solving Impairment: Verbal basic  ?  ?   ?Comprehension ? Auditory Comprehension ?Overall Auditory  Comprehension: Appears within functional limits for tasks assessed ?Yes/No Questions: Within Functional Limits ?Interfering Components: Attention;Processing speed;Working memory  ?  ?Expression Expression ?Primary Mode of Expression: Verbal ?Verbal Expression ?Overall Verbal Expression: Impaired ?Initiation: No impairment ?Level of Generative/Spontaneous Verbalization:  Conversation ?Repetition: No impairment ?Pragmatics: Impairment ?Impairments: Topic maintenance;Topic appropriateness;Turn Taking;Interpretation of nonverbal communication   ?Oral / Motor ? Oral Motor/Sensory Function ?Ov

## 2021-10-30 DIAGNOSIS — R296 Repeated falls: Secondary | ICD-10-CM | POA: Diagnosis not present

## 2021-10-30 LAB — BASIC METABOLIC PANEL
Anion gap: 9 (ref 5–15)
BUN: 23 mg/dL (ref 8–23)
CO2: 25 mmol/L (ref 22–32)
Calcium: 8.9 mg/dL (ref 8.9–10.3)
Chloride: 102 mmol/L (ref 98–111)
Creatinine, Ser: 1.26 mg/dL — ABNORMAL HIGH (ref 0.61–1.24)
GFR, Estimated: 54 mL/min — ABNORMAL LOW (ref >60)
Glucose, Bld: 154 mg/dL — ABNORMAL HIGH (ref 70–99)
Potassium: 3.9 mmol/L (ref 3.5–5.1)
Sodium: 136 mmol/L (ref 135–145)

## 2021-10-30 LAB — CBC
HCT: 44 % (ref 39.0–52.0)
Hemoglobin: 14.8 g/dL (ref 13.0–17.0)
MCH: 32 pg (ref 26.0–34.0)
MCHC: 33.6 g/dL (ref 30.0–36.0)
MCV: 95.2 fL (ref 80.0–100.0)
Platelets: 216 10*3/uL (ref 150–400)
RBC: 4.62 MIL/uL (ref 4.22–5.81)
RDW: 13.1 % (ref 11.5–15.5)
WBC: 8.6 10*3/uL (ref 4.0–10.5)
nRBC: 0 % (ref 0.0–0.2)

## 2021-10-30 LAB — GLUCOSE, CAPILLARY
Glucose-Capillary: 166 mg/dL — ABNORMAL HIGH (ref 70–99)
Glucose-Capillary: 170 mg/dL — ABNORMAL HIGH (ref 70–99)
Glucose-Capillary: 190 mg/dL — ABNORMAL HIGH (ref 70–99)
Glucose-Capillary: 204 mg/dL — ABNORMAL HIGH (ref 70–99)
Glucose-Capillary: 80 mg/dL (ref 70–99)

## 2021-10-30 MED ORDER — ADULT MULTIVITAMIN W/MINERALS CH
1.0000 | ORAL_TABLET | Freq: Every day | ORAL | Status: DC
  Administered 2021-10-30 – 2021-11-05 (×7): 1 via ORAL
  Filled 2021-10-30 (×7): qty 1

## 2021-10-30 MED ORDER — ENSURE ENLIVE PO LIQD
237.0000 mL | Freq: Two times a day (BID) | ORAL | Status: DC
  Administered 2021-10-31 – 2021-11-05 (×9): 237 mL via ORAL

## 2021-10-30 NOTE — Progress Notes (Signed)
PROGRESS NOTE  Stephen Wilkinson  EXB:[id number] DOB: [birth year] DOA: 10/28/2021 PCP: Biagio Borg, MD   Brief Narrative:  Patient is a 88 year old male with history of diabetes type 2, glaucoma, hypertension, hyperlipidemia, paroxysmal A-fib, hypothyroidism who presented with recurrent falls from home.  Report of falling frequently, significant fall few days ago before admission with a lumbar compression fracture.  Since then more falls occurred and he was also complaining of lower extremity weakness.  CT lumbar spine showed  acute compression fracture of the L3 vertebral body up to approximately 10% loss of vertebral body height centrally.neurosurgery consulted on admission, recommended TLSO.  IR consulted for possible kyphoplasty.  PT/OT recommending SNF on discharge.  Assessment & Plan:  Principal Problem:   Recurrent falls Active Problems:   Lumbar compression fracture, closed, initial encounter ([place])   Atrial fibrillation, chronic (HCC)   Hypothyroidism   Macular degeneration of both eyes   DM type 2 with diabetic peripheral neuropathy ([place])   Memory loss  Recurrent falls: Worsening.  Came from home.  Also complained of bilateral lower extremity weakness, gait disturbance.  Patient also has macular degeneration.  CT head did not show any acute intracranial findings.  PT/OT consulted and recommended skilled nursing facility on discharge.  Back pain/compression fracture:  CT lumbar spine showed  acute compression fracture of the L3 vertebral body up to approximately 10% loss of vertebral body height centrally.neurosurgery consulted on admission, recommended TLSO.  IR consulted for possible kyphoplasty.  Imagings also showed previous compression fracture, other degenerative changes. Continue pain medication, supportive care.  Permanent A-fib: Remains in A-fib but controlled rate on Toprol, amiodarone.  On Eliquis for anticoagulation now on hold for kyphoplasty plan.  Diabetes type 2: Recent  hemoglobin of 7.4.  Takes metformin at home.  Currently on sliding scale insulin  Hypertension: Continue Toprol  Hypothyroidism: Continue Synthyroid  AKI: Mild: Continue monitoring  Glaucoma/macular degeneration: Might be contributing to the recurrent falls.  Receives Avastin injection as an outpatient.  Continue latanoprost  Confusion/memory loss: Suspected to have memory impairment.  Currently confused, delirious.  Most likely delirium.  Continue delirium precaution.  Started on low-dose Seroquel. Landscape architect.  Goals of care: Remains full code for now.  Will discuss goals of care with family     Nutrition Problem: Moderate Malnutrition Etiology: chronic illness    DVT prophylaxis:     Code Status: Full Code  Family Communication: Called spouse on phone,call not received  Patient status: Inpatient  Patient is from : Home  Anticipated discharge to: Skilled nursing facility  Estimated DC date: not sure at the [facility] kyphoplasty   Consultants: IR  Procedures: None yet  Antimicrobials:  Anti-infectives (From admission, onward)    None       Subjective:  Patient seen and examined at the bedside this morning.  Hemodynamically stable.  On restraints.  Looks sleepy today but opened his eyes and talked when called his name.  Still confused.  Objective: Vitals:   10/29/21 2340 10/30/21 0311 10/30/21 0747 10/30/21 1117  BP: 115/86 112/85 124/84 (!) 140/99  Pulse: 91  79 85  Resp: '17 16 17 18  '$ Temp: 97.9 F (36.6 C) 98.9 F (37.2 C) 98.4 F (36.9 C) 97.6 F (36.4 C)  TempSrc: Axillary Axillary Axillary Oral  SpO2: 95% 100% 97% 98%  Weight:      Height:        Intake/Output Summary (Last 24 hours) at 10/30/2021 1126 Last data filed at 10/30/2021 0839 Gross per  24 hour  Intake 770 ml  Output 300 ml  Net 470 ml   Filed Weights   10/28/21 1147 10/28/21 2052  Weight: 81.6 kg 77 kg    Examination:   General exam: Overall comfortable, sleepy,  on restraints, confused HEENT: PERRL Respiratory system:  no wheezes or crackles  Cardiovascular system: S1 & S2 heard, RRR.  Gastrointestinal system: Abdomen is nondistended, soft and nontender. Central nervous system: Not oriented Extremities: No edema, no clubbing ,no cyanosis, TLSO brace Skin: No rashes, no ulcers,no icterus     Data Reviewed: I have personally reviewed following labs and imaging studies  CBC: Recent Labs  Lab 10/28/21 1226 10/30/21 0343  WBC 9.6 8.6  NEUTROABS 7.8*  --   HGB 13.8 14.8  HCT 42.1 44.0  MCV 96.6 95.2  PLT 211 229   Basic Metabolic Panel: Recent Labs  Lab 10/28/21 1226 10/30/21 0343  NA 135 136  K 4.4 3.9  CL 102 102  CO2 24 25  GLUCOSE 176* 154*  BUN 19 23  CREATININE 1.42* 1.26*  CALCIUM 8.8* 8.9     No results found for this or any previous visit (from the past 240 hour(s)).   Radiology Studies: CT Head Wo Contrast  Result Date: 10/28/2021 CLINICAL DATA:  Trauma slid off couch EXAM: CT HEAD WITHOUT CONTRAST TECHNIQUE: Contiguous axial images were obtained from the base of the skull through the vertex without intravenous contrast. RADIATION DOSE REDUCTION: This exam was performed according to the departmental dose-optimization program which includes automated exposure control, adjustment of the mA and/or kV according to patient size and/or use of iterative reconstruction technique. COMPARISON:  06/08/2014 correlation is also made with MRI head 11/02/2018 FINDINGS: Brain: No evidence of acute infarction, hemorrhage, cerebral edema, mass, mass effect, or midline shift. No hydrocephalus or extra-axial fluid collection. Generalized cerebral and cerebellar atrophy, with prominent ventricles. Periventricular white matter changes, likely the sequela of chronic small vessel ischemic disease. Vascular: No hyperdense vessel. Skull: Normal. Negative for fracture or focal lesion. Sinuses/Orbits: Mucosal thickening in the left-greater-than-right  maxillary sinus. Status post bilateral lens replacements. Other: The mastoid air cells are well aerated. IMPRESSION: No acute intracranial process. Electronically Signed   By: Merilyn Baba M.D.   On: 10/28/2021 12:32   CT Lumbar Spine Wo Contrast  Result Date: 10/28/2021 CLINICAL DATA:  Fall, lower back pain EXAM: CT LUMBAR SPINE WITHOUT CONTRAST TECHNIQUE: Multidetector CT imaging of the lumbar spine was performed without intravenous contrast administration. Multiplanar CT image reconstructions were also generated. RADIATION DOSE REDUCTION: This exam was performed according to the departmental dose-optimization program which includes automated exposure control, adjustment of the mA and/or kV according to patient size and/or use of iterative reconstruction technique. COMPARISON:  Lumbar spine MRI 11/28/2019 FINDINGS: Segmentation: Standard; the lowest formed disc space is designated L5-S1. Alignment: There is trace retrolisthesis of L2 on L3 and grade 1 anterolisthesis of L5 on S1, similar to the prior MRI. Alignment is otherwise normal. There is no evidence of traumatic malalignment. Vertebrae: Compression deformity of the L2 vertebral body with up to approximately 50% loss of vertebral body height is similar in extent compared to the MRI from 2021, with interval vertebral augmentation. There is an acute compression fracture of the L3 vertebral body with fracture planes extending from the anterior endplate to the superior endplate as well as into posterior endplate and bilateral pedicles (12-52, 12-32). There is no bony retropulsion or definite epidural hematoma. There is up to approximately 10% loss of  vertebral body height centrally. The other vertebral body heights are preserved. There are flowing osteophytes throughout the lower thoracic spine. The bones are diffusely demineralized. Paraspinal and other soft tissues: Coronary artery and aortic valve calcifications are noted. There is calcified  atherosclerotic plaque throughout the imaged aorta. There is fatty atrophy of the pancreas. Paraspinal soft tissues are unremarkable. Disc levels: T12-L1: No significant spinal canal or neural foraminal stenosis L1-L2: There is a mild disc bulge and mild bilateral facet arthropathy without significant spinal canal or neural foraminal stenosis L2-L3: There is a disc bulge eccentric to the left, degenerative endplate change, and bilateral facet arthropathy resulting in moderate to severe left and moderate right neural foraminal stenosis and at least moderate spinal canal stenosis L3-L4: Status post posterior decompression. There is a diffuse disc bulge, degenerative endplate change, and bilateral facet arthropathy resulting in severe bilateral neural foraminal stenosis and likely moderate spinal canal stenosis L4-L5: Status post posterior decompression. There is a mild disc bulge, degenerative endplate change, and bilateral facet arthropathy resulting in moderate bilateral neural foraminal stenosis without evidence of significant spinal canal stenosis L5-S1: Status post posterior decompression. There is grade 1 anterolisthesis with a mild disc bulge and bilateral facet arthropathy resulting in moderate to severe bilateral neural foraminal stenosis without evidence of significant spinal canal stenosis. IMPRESSION: 1. Acute compression fracture of the L3 vertebral body up to approximately 10% loss of vertebral body height centrally. Fracture planes extend to involve the bilateral pedicles, but there is no bony retropulsion or definite epidural hematoma. 2. Chronic compression deformity of the L2 vertebral body status post vertebral augmentation. 3. Status post posterior decompression at L3-L4 through L5-S1. 4. Multilevel degenerative changes as above resulting in at least moderate spinal canal stenosis at L2-L3 and multilevel neural foraminal stenosis. Electronically Signed   By: Valetta Mole M.D.   On: 10/28/2021 12:39     Scheduled Meds:  amiodarone  200 mg Oral Daily   docusate sodium  100 mg Oral BID   famotidine  20 mg Oral Daily   feeding supplement  237 mL Oral BID BM   insulin aspart  0-15 Units Subcutaneous TID WC   latanoprost  1 drop Both Eyes QHS   levothyroxine  125 mcg Oral Q0600   lidocaine  1-3 patch Transdermal Q24H   metoprolol succinate  25 mg Oral q morning   multivitamin with minerals  1 tablet Oral Daily   QUEtiapine  25 mg Oral QHS   sodium chloride flush  3 mL Intravenous Q12H   Continuous Infusions:  methocarbamol (ROBAXIN) IV 500 mg (10/29/21 1539)     LOS: 2 days   Shelly Coss, MD [facility] P5/18/2023, 11:26 AM

## 2021-10-30 NOTE — TOC Initial Note (Signed)
Transition of Care [facility]) - Initial/Assessment Note    Patient Details  Name: Stephen Wilkinson MRN: [id number] Date of Birth: 1928/10/08  [facility]) CM/SW Contact:    Vinie Sill, LCSW Phone Number: 10/30/2021, 4:02 PM  Clinical Narrative:                  CSW spoke with patient's spouse- CSW introduced self, explained role and reason for consult. Patient states they live in the home alone. She reports she is unable to physically assist in the patient in the home and unable to provide the required care at this time. She hopes patient can get stronger and be able to walk independently before discharging home. She states they have no family support near by. CSW explained the SNF process. No preferred SNF at this time.   CSW will provide bed offers once available  CSW will continue to follow and assist with bed offers.   Thurmond Butts, MSW, LCSW Clinical Social Worker    Expected Discharge Plan: Skilled Nursing Facility Barriers to Discharge: Continued Medical Work up, SNF Pending bed offer   Patient Goals and CMS Choice        Expected Discharge Plan and Services Expected Discharge Plan: [place] In-house Referral: Clinical Social Work     Living arrangements for the past 2 months: Single Family Home                                      Prior Living Arrangements/Services Living arrangements for the past 2 months: Single Family Home Lives with:: Self, Spouse                   Activities of Daily Living      Permission Sought/Granted                  Emotional Assessment           Psych Involvement: No (comment)  Admission diagnosis:  Recurrent falls [R29.6] Compression fracture of L3 vertebra, initial encounter ([place]) [S32.030A] Patient Active Problem List   Diagnosis Date Noted   Lumbar compression fracture, closed, initial encounter ([place]) 10/28/2021   Atrial fibrillation, chronic ([place]) 10/28/2021    Epiretinal membrane, right eye 09/24/2021   Epistaxis    Memory loss 08/29/2021   Hallucinations 08/29/2021   Acute gouty arthritis 08/29/2021   Vitamin D deficiency 08/29/2021   Hypotension 05/20/2021   Fall 05/20/2021   Aortic atherosclerosis ([place]) 10/09/2020   Excessive cerumen in both ear canals 04/10/2020   History of vitrectomy 12/04/2019   Bilateral hip pain 11/26/2019   Tremor 11/26/2019   Low back pain 11/23/2019   Gait disorder 11/23/2019   Recurrent falls 11/23/2019   General weakness 11/23/2019   Exudative age-related macular degeneration of right eye with active choroidal neovascularization ([address]) 10/16/2019   Advanced nonexudative age-related macular degeneration of left eye with subfoveal involvement 10/16/2019   Advanced nonexudative age-related macular degeneration of right eye without subfoveal involvement 10/16/2019   [place], vitreous, right 10/16/2019   Epiretinal membrane, left eye 10/16/2019   Hallucination 02/21/2019   Balance disorder 09/30/2018   Dizziness 09/30/2018   Anxiety 09/25/2017   Lumbar stenosis with neurogenic claudication 02/12/2017   Lumbar spinal stenosis 11/24/2016   Left sided sciatica 11/24/2016   Renal cyst 11/24/2016   Bilateral leg cramps 09/23/2016   Nasal sore 09/23/2016   Vitamin B 12  deficiency 09/23/2016   CMC arthritis, thumb, degenerative 05/26/2016   Memory dysfunction 03/24/2016   Rash and nonspecific skin eruption 11/20/2014   Cough 10/10/2014   Sciatica of left side 08/21/2014   GERD (gastroesophageal reflux disease) 01/04/2014   Hearing loss, bilateral 10/18/2013   Wrist arthritis 10/18/2013   Preventative health care 03/30/2013   Impaired vision in both eyes 03/30/2013   Erectile dysfunction 03/30/2013   Skin cancer    Hyperlipidemia    Chronic cough 03/10/2013   First degree heart block 01/04/2013   DM type 2 with diabetic peripheral neuropathy ([place]) 01/04/2013   BPH (benign prostatic hyperplasia) 01/04/2013    Hx of transient ischemic attack (TIA) 01/04/2013   Glaucoma 01/04/2013   Osteoarthritis of right knee 05/12/2011   Hypothyroidism 12/25/2010   Benign hypertensive heart disease without heart failure 12/25/2010   Anxiety and depression 12/25/2010   Macular degeneration of both eyes 12/25/2010   PCP:  Biagio Borg, MD Pharmacy:   [facility] [address] [place] [address] Phone: [phone] Fax: [facility] [address], [place] - [address] [place] [place] [address] Phone: [phone] Fax: [phone]     Social Determinants of Health (SDOH) Interventions    Readmission Risk Interventions     View : No data to display.

## 2021-10-30 NOTE — Progress Notes (Signed)
Physical Therapy Treatment Patient Details Name: Stephen Wilkinson MRN: [id number] DOB: [birth year] Today's Date: 10/30/2021   History of Present Illness 88 y.o. male presents to [facility] hospital on 10/28/2021 with recurrent falls and LE weakness. Pt diagnosed 5/10 with a lumbar compression fx. PMH includes DM; glaucoma; HTN; HLD; afib; and hypothyroidism.    PT Comments    Pt with increased confusion and weakness this session. Pt without recall of previous session, reporting he has not walked since the fall that caused his compression fx. Pt with increased assistance to stand, pt refusing ambulation. Mobility deficits are compounded by pt's anxiety and fear of falling, as he becomes very anxious in standing and even declines participation in seated exercise due to worries of back pain. Pt will benefit from continued acute PT services in  an effort to improve mobility quality and reduce falls risk. PT continues to recommend SNF placement.   Recommendations for follow up therapy are one component of a multi-disciplinary discharge planning process, led by the attending physician.  Recommendations may be updated based on patient status, additional functional criteria and insurance authorization.  Follow Up Recommendations  Skilled nursing-short term rehab (<3 hours/day)     Assistance Recommended at Discharge Frequent or constant Supervision/Assistance  Patient can return home with the following A lot of help with walking and/or transfers;A little help with bathing/dressing/bathroom;Assistance with cooking/housework;Direct supervision/assist for medications management;Direct supervision/assist for financial management;Assist for transportation;Help with stairs or ramp for entrance   Equipment Recommendations  None recommended by PT    Recommendations for Other Services       Precautions / Restrictions Precautions Precautions: Fall;Back Precaution Booklet Issued: Yes (comment) Precaution Comments:  compression fx, TLSO at all times other than when showering Required Braces or Orthoses: Spinal Brace Spinal Brace: Thoracolumbosacral orthotic;Applied in sitting position Restrictions Weight Bearing Restrictions: No     Mobility  Bed Mobility               General bed mobility comments: pt received sitting at edge of bed with nursing staff    Transfers Overall transfer level: Needs assistance Equipment used: Rolling walker (2 wheels) Transfers: Sit to/from Stand, Bed to chair/wheelchair/BSC Sit to Stand: Mod assist, Min assist   Step pivot transfers: Min assist       General transfer comment: pt initially requiring modA to stand, stands three additional times with PT verbal and tactile cues for foot and hand placement along with trunk flexion. PT provides minA and tactile cues to guide turn during stand step transfer    Ambulation/Gait Ambulation/Gait assistance:  (pt declines attempts, concerned about falling and back pain)                 Stairs             Wheelchair Mobility    Modified Rankin (Stroke Patients Only)       Balance Overall balance assessment: Needs assistance Sitting-balance support: No upper extremity supported, Feet supported Sitting balance-Leahy Scale: Fair     Standing balance support: Bilateral upper extremity supported, Reliant on assistive device for balance Standing balance-Leahy Scale: Poor Standing balance comment: minA with BUE support of RW                            Cognition Arousal/Alertness: Awake/alert Behavior During Therapy: Anxious Overall Cognitive Status: Impaired/Different from baseline Area of Impairment: Orientation, Attention, Memory, Safety/judgement, Following commands, Awareness, Problem solving  Orientation Level: Disoriented to, Place, Time, Situation Current Attention Level: Focused Memory: Decreased recall of precautions, Decreased short-term  memory Following Commands: Follows one step commands with increased time Safety/Judgement: Decreased awareness of safety, Decreased awareness of deficits Awareness: Intellectual Problem Solving: Slow processing, Difficulty sequencing, Requires verbal cues, Requires tactile cues          Exercises General Exercises - Lower Extremity Ankle Circles/Pumps: Both, AAROM, 5 reps    General Comments General comments (skin integrity, edema, etc.): VSS on RA      Pertinent Vitals/Pain Pain Assessment Pain Assessment: Faces Faces Pain Scale: Hurts even more Pain Location: back Pain Descriptors / Indicators: Grimacing Pain Intervention(s): Monitored during session    Home Living                          Prior Function            PT Goals (current goals can now be found in the care plan section) Acute Rehab PT Goals Patient Stated Goal: to stop falling Progress towards PT goals: Not progressing toward goals - comment (regression, pt is more confused and requires increased assistance to mobilize)    Frequency    Min 3X/week      PT Plan Current plan remains appropriate    Co-evaluation              AM-PAC PT "6 Clicks" Mobility   Outcome Measure  Help needed turning from your back to your side while in a flat bed without using bedrails?: A Little Help needed moving from lying on your back to sitting on the side of a flat bed without using bedrails?: A Little Help needed moving to and from a bed to a chair (including a wheelchair)?: A Little Help needed standing up from a chair using your arms (e.g., wheelchair or bedside chair)?: A Little Help needed to walk in hospital room?: Total Help needed climbing 3-5 steps with a railing? : Total 6 Click Score: 14    End of Session Equipment Utilized During Treatment: Back brace Activity Tolerance: Patient limited by pain Patient left: in chair;with call bell/phone within reach;with chair alarm set;with  nursing/sitter in room Nurse Communication: Mobility status PT Visit Diagnosis: Other abnormalities of gait and mobility (R26.89);History of falling (Z91.81)     Time: 4193-7902 PT Time Calculation (min) (ACUTE ONLY): 18 min  Charges:  $Therapeutic Activity: 8-22 mins                     Zenaida Niece, PT, DPT Acute Rehabilitation Pager: [phone] Office [place] 10/30/2021, 1:49 PM

## 2021-10-30 NOTE — NC FL2 (Signed)
[place] LEVEL OF CARE SCREENING TOOL     IDENTIFICATION  Patient Name: Stephen Wilkinson Birthdate: 07-Dec-1928 Sex: male Admission Date (Current Location): 10/28/2021  [facility] and [place] Number:  Herbalist and Address:  [facility], [place] [address], [place], [place] [address]      Provider Number: [id number]  Attending Physician Name and Address:  Shelly Coss, MD  Relative Name and Phone Number:       Current Level of Care: Hospital Recommended Level of Care: [place] Prior Approval Number:    Date Approved/Denied:   PASRR Number:    Discharge Plan: SNF    Current Diagnoses: Patient Active Problem List   Diagnosis Date Noted   Lumbar compression fracture, closed, initial encounter ([place]) 10/28/2021   Atrial fibrillation, chronic ([place]) 10/28/2021   Epiretinal membrane, right eye 09/24/2021   Epistaxis    Memory loss 08/29/2021   Hallucinations 08/29/2021   Acute gouty arthritis 08/29/2021   Vitamin D deficiency 08/29/2021   Hypotension 05/20/2021   Fall 05/20/2021   Aortic atherosclerosis ([place]) 10/09/2020   Excessive cerumen in both ear canals 04/10/2020   History of vitrectomy 12/04/2019   Bilateral hip pain 11/26/2019   Tremor 11/26/2019   Low back pain 11/23/2019   Gait disorder 11/23/2019   Recurrent falls 11/23/2019   General weakness 11/23/2019   Exudative age-related macular degeneration of right eye with active choroidal neovascularization ([place]) 10/16/2019   Advanced nonexudative age-related macular degeneration of left eye with subfoveal involvement 10/16/2019   Advanced nonexudative age-related macular degeneration of right eye without subfoveal involvement 10/16/2019   [place], vitreous, right 10/16/2019   Epiretinal membrane, left eye 10/16/2019   Hallucination 02/21/2019   Balance disorder 09/30/2018   Dizziness 09/30/2018   Anxiety 09/25/2017   Lumbar stenosis with neurogenic  claudication 02/12/2017   Lumbar spinal stenosis 11/24/2016   Left sided sciatica 11/24/2016   Renal cyst 11/24/2016   Bilateral leg cramps 09/23/2016   Nasal sore 09/23/2016   Vitamin B 12 deficiency 09/23/2016   CMC arthritis, thumb, degenerative 05/26/2016   Memory dysfunction 03/24/2016   Rash and nonspecific skin eruption 11/20/2014   Cough 10/10/2014   Sciatica of left side 08/21/2014   GERD (gastroesophageal reflux disease) 01/04/2014   Hearing loss, bilateral 10/18/2013   Wrist arthritis 10/18/2013   Preventative health care 03/30/2013   Impaired vision in both eyes 03/30/2013   Erectile dysfunction 03/30/2013   Skin cancer    Hyperlipidemia    Chronic cough 03/10/2013   First degree heart block 01/04/2013   DM type 2 with diabetic peripheral neuropathy ([place]) 01/04/2013   BPH (benign prostatic hyperplasia) 01/04/2013   Hx of transient ischemic attack (TIA) 01/04/2013   Glaucoma 01/04/2013   Osteoarthritis of right knee 05/12/2011   Hypothyroidism 12/25/2010   Benign hypertensive heart disease without heart failure 12/25/2010   Anxiety and depression 12/25/2010   Macular degeneration of both eyes 12/25/2010    Orientation RESPIRATION BLADDER Height & Weight        Normal Continent, External catheter Weight: 169 lb 12.1 oz (77 kg) Height:  6' 1.5" (186.7 cm)  BEHAVIORAL SYMPTOMS/MOOD NEUROLOGICAL BOWEL NUTRITION STATUS      Continent Diet (Please see discharge summary)  AMBULATORY STATUS COMMUNICATION OF NEEDS Skin     Verbally Normal                       Personal Care Assistance Level of  Assistance  Bathing, Feeding, Dressing Bathing Assistance: Limited assistance Feeding assistance: Independent Dressing Assistance: Limited assistance     Functional Limitations Info  Sight, Hearing, Speech Sight Info: Impaired Hearing Info: Impaired Speech Info: Adequate    SPECIAL CARE FACTORS FREQUENCY  PT (By licensed PT), OT (By licensed OT)     PT  Frequency: 5x per week OT Frequency: 5x per week            Contractures Contractures Info: Not present    Additional Factors Info  Code Status, Allergies Code Status Info: FULL Allergies Info: NKA           Current Medications (10/30/2021):  This is the current hospital active medication list Current Facility-Administered Medications  Medication Dose Route Frequency Provider Last Rate Last Admin   acetaminophen (TYLENOL) tablet 650 mg  650 mg Oral Q6H PRN Karmen Bongo, MD   650 mg at 10/29/21 1502   Or   acetaminophen (TYLENOL) suppository 650 mg  650 mg Rectal Q6H PRN Karmen Bongo, MD       amiodarone (PACERONE) tablet 200 mg  200 mg Oral Daily Karmen Bongo, MD   200 mg at 10/30/21 1115   bisacodyl (DULCOLAX) EC tablet 5 mg  5 mg Oral Daily PRN Karmen Bongo, MD       docusate sodium (COLACE) capsule 100 mg  100 mg Oral BID Karmen Bongo, MD   100 mg at 10/30/21 1115   famotidine (PEPCID) tablet 20 mg  20 mg Oral Daily Karmen Bongo, MD   20 mg at 10/30/21 1114   feeding supplement (ENSURE ENLIVE / ENSURE PLUS) liquid 237 mL  237 mL Oral BID BM Adhikari, Amrit, MD       haloperidol lactate (HALDOL) injection 2 mg  2 mg Intravenous Q6H PRN Shelly Coss, MD   2 mg at 10/29/21 1026   hydrALAZINE (APRESOLINE) injection 5 mg  5 mg Intravenous Q4H PRN Karmen Bongo, MD       insulin aspart (novoLOG) injection 0-15 Units  0-15 Units Subcutaneous TID WC Karmen Bongo, MD   3 Units at 10/30/21 1418   latanoprost (XALATAN) 0.005 % ophthalmic solution 1 drop  1 drop Both Eyes QHS Karmen Bongo, MD   1 drop at 10/29/21 2203   levothyroxine (SYNTHROID) tablet 125 mcg  125 mcg Oral Q0600 Karmen Bongo, MD   125 mcg at 10/30/21 0640   lidocaine (LIDODERM) 5 % 1-3 patch  1-3 patch Transdermal Q24H Karmen Bongo, MD   1 patch at 10/28/21 1656   methocarbamol (ROBAXIN) 500 mg in dextrose 5 % 50 mL IVPB  500 mg Intravenous Q6H PRN Karmen Bongo, MD 100 mL/hr at  10/29/21 1539 500 mg at 10/29/21 1539   metoprolol succinate (TOPROL-XL) 24 hr tablet 25 mg  25 mg Oral q morning Karmen Bongo, MD   25 mg at 10/30/21 1115   multivitamin with minerals tablet 1 tablet  1 tablet Oral Daily Shelly Coss, MD   1 tablet at 10/30/21 1421   ondansetron (ZOFRAN) tablet 4 mg  4 mg Oral Q6H PRN Karmen Bongo, MD       Or   ondansetron [facility]) injection 4 mg  4 mg Intravenous Q6H PRN Karmen Bongo, MD       oxyCODONE (Oxy IR/ROXICODONE) immediate release tablet 5 mg  5 mg Oral Q4H PRN Karmen Bongo, MD   5 mg at 10/30/21 1421   polyethylene glycol (MIRALAX / GLYCOLAX) packet 17 g  17 g Oral Daily PRN  Karmen Bongo, MD       QUEtiapine (SEROQUEL) tablet 25 mg  25 mg Oral QHS Shelly Coss, MD   25 mg at 10/29/21 2201   sodium chloride flush (NS) 0.9 % injection 3 mL  3 mL Intravenous Q12H Karmen Bongo, MD   3 mL at 10/30/21 1115     Discharge Medications: Please see discharge summary for a list of discharge medications.  Relevant Imaging Results:  Relevant Lab Results:   Additional Information SSN # [id number] [place] COVID-19 Vaccine 07/17/2019 , 06/26/2019  Vinie Sill, LCSW

## 2021-10-30 NOTE — Progress Notes (Signed)
Interventional Radiology Brief Note:  Patient has been approved for L3 vertebroplasty/kyphoplasty with NIR.  No insurance pre-authorization required.   Pr Dr. Gerhard Perches, hold Eliquis x5 days prior to procedure.  Last dose Eliquis was yesterday (5/17).  Formal consult to follow with plan to proceed with vertebroplasty/kyphoplasty early next week.   Brynda Greathouse, MS RD PA-C

## 2021-10-31 DIAGNOSIS — R296 Repeated falls: Secondary | ICD-10-CM | POA: Diagnosis not present

## 2021-10-31 LAB — GLUCOSE, CAPILLARY
Glucose-Capillary: 128 mg/dL — ABNORMAL HIGH (ref 70–99)
Glucose-Capillary: 273 mg/dL — ABNORMAL HIGH (ref 70–99)
Glucose-Capillary: 163 mg/dL — ABNORMAL HIGH (ref 70–99)
Glucose-Capillary: 130 mg/dL — ABNORMAL HIGH (ref 70–99)

## 2021-10-31 NOTE — Progress Notes (Signed)
PROGRESS NOTE  Stephen Wilkinson  TWS:[id number] DOB: 1929-06-13 DOA: 10/28/2021 PCP: Biagio Borg, MD   Brief Narrative:  Patient is a 88 year old male with history of diabetes type 2, glaucoma, hypertension, hyperlipidemia, paroxysmal A-fib, hypothyroidism who presented with recurrent falls from home.  Report of falling frequently, significant fall few days ago before admission with a lumbar compression fracture.  Since then more falls occurred and he was also complaining of lower extremity weakness.  CT lumbar spine showed  acute compression fracture of the L3 vertebral body up to approximately 10% loss of vertebral body height centrally.neurosurgery consulted on admission, recommended TLSO.  IR consulted for possible kyphoplasty.  PT/OT recommending SNF on discharge.  Assessment & Plan:  Principal Problem:   Recurrent falls Active Problems:   Lumbar compression fracture, closed, initial encounter ([place])   Atrial fibrillation, chronic (HCC)   Hypothyroidism   Macular degeneration of both eyes   DM type 2 with diabetic peripheral neuropathy ([place])   Memory loss  Recurrent falls: Worsening.  Came from home.  Also complained of bilateral lower extremity weakness, gait disturbance.  Patient also has macular degeneration.  CT head did not show any acute intracranial findings.  PT/OT consulted and recommended skilled nursing facility on discharge.  Back pain/compression fracture:  CT lumbar spine showed  acute compression fracture of the L3 vertebral body up to approximately 10% loss of vertebral body height centrally.neurosurgery consulted on admission, recommended TLSO. IR consulted for  kyphoplasty.  Imagings also showed previous compression fracture, other degenerative changes.Continue pain medication, supportive care.  Permanent A-fib: Remains in A-fib but controlled rate on Toprol, amiodarone.  On Eliquis for anticoagulation now on hold for kyphoplasty plan.  Diabetes type 2: Recent hemoglobin  of 7.4.  Takes metformin at home.  Currently on sliding scale insulin  Hypertension: Continue Toprol  Hypothyroidism: Continue Synthyroid  AKI: Mild: Continue monitoring  Glaucoma/macular degeneration: Might be contributing to the recurrent falls.  Receives Avastin injection as an outpatient.  Continue latanoprost  Confusion/memory loss: Suspected to have memory impairment.  Currently confused, delirious.  Most likely delirium.  Continue delirium precaution.  Started on low-dose Seroquel. Continue sitter if needed  Goals of care: Remains full code for now.      Nutrition Problem: Moderate Malnutrition Etiology: chronic illness    DVT prophylaxis:     Code Status: Full Code  Family Communication: Called spouse and discussed the plan on 5/19  Patient status: Inpatient  Patient is from : Home  Anticipated discharge to: Skilled nursing facility  Estimated DC date: not sure at the [facility] kyphoplasty   Consultants: IR  Procedures: None yet  Antimicrobials:  Anti-infectives (From admission, onward)    None       Subjective:  Patient seen and examined the bedside this morning.  Hemodynamically stable, remains confused but less agitated than yesterday.  Had a long discussion with wife on phone today  Objective: Vitals:   10/30/21 2318 10/31/21 0316 10/31/21 0318 10/31/21 0754  BP:  104/73  (!) 126/91  Pulse:  67  79  Resp:  17  16  Temp: 98.2 F (36.8 C) 97.7 F (36.5 C) 97.7 F (36.5 C) (!) 97.5 F (36.4 C)  TempSrc: Oral Axillary Axillary Oral  SpO2:  96% 97% 98%  Weight:      Height:        Intake/Output Summary (Last 24 hours) at 10/31/2021 1041 Last data filed at 10/31/2021 0820 Gross per 24 hour  Intake 360 ml  Output  500 ml  Net -140 ml   Filed Weights   10/28/21 1147 10/28/21 2052  Weight: 81.6 kg 77 kg    Examination:  General exam: Overall comfortable, not in distress HEENT: PERRL Respiratory system:  no wheezes or crackles   Cardiovascular system: S1 & S2 heard, RRR.  Gastrointestinal system: Abdomen is nondistended, soft and nontender. Central nervous system: awake but confused Extremities: No edema, no clubbing ,no cyanosis.TLSO brace Skin: No rashes, no ulcers,no icterus      Data Reviewed: I have personally reviewed following labs and imaging studies  CBC: Recent Labs  Lab 10/28/21 1226 10/30/21 0343  WBC 9.6 8.6  NEUTROABS 7.8*  --   HGB 13.8 14.8  HCT 42.1 44.0  MCV 96.6 95.2  PLT 211 098   Basic Metabolic Panel: Recent Labs  Lab 10/28/21 1226 10/30/21 0343  NA 135 136  K 4.4 3.9  CL 102 102  CO2 24 25  GLUCOSE 176* 154*  BUN 19 23  CREATININE 1.42* 1.26*  CALCIUM 8.8* 8.9     No results found for this or any previous visit (from the past 240 hour(s)).   Radiology Studies: No results found.  Scheduled Meds:  amiodarone  200 mg Oral Daily   docusate sodium  100 mg Oral BID   famotidine  20 mg Oral Daily   feeding supplement  237 mL Oral BID BM   insulin aspart  0-15 Units Subcutaneous TID WC   latanoprost  1 drop Both Eyes QHS   levothyroxine  125 mcg Oral Q0600   lidocaine  1-3 patch Transdermal Q24H   metoprolol succinate  25 mg Oral q morning   multivitamin with minerals  1 tablet Oral Daily   QUEtiapine  25 mg Oral QHS   sodium chloride flush  3 mL Intravenous Q12H   Continuous Infusions:  methocarbamol (ROBAXIN) IV 500 mg (10/29/21 1539)     LOS: 3 days   Shelly Coss, MD [facility] P5/19/2023, 10:41 AM

## 2021-10-31 NOTE — Care Management Important Message (Signed)
Important Message  Patient Details  Name: Rome Echavarria MRN: [id number] Date of Birth: October 12, 1928   Medicare Important Message Given:  Yes     Raynold Blankenbaker Montine Circle 10/31/2021, 3:48 PM

## 2021-10-31 NOTE — Progress Notes (Incomplete Revision)
PROGRESS NOTE  Stephen Wilkinson  VZC:[id number] DOB: Apr 14, 1929 DOA: 10/28/2021 PCP: Biagio Borg, MD   Brief Narrative:  Patient is a 88 year old male with history of diabetes type 2, glaucoma, hypertension, hyperlipidemia, paroxysmal A-fib, hypothyroidism who presented with recurrent falls from home.  Report of falling frequently, significant fall few days ago before admission with a lumbar compression fracture.  Since then more falls occurred and he was also complaining of lower extremity weakness.  CT lumbar spine showed  acute compression fracture of the L3 vertebral body up to approximately 10% loss of vertebral body height centrally.neurosurgery consulted on admission, recommended TLSO.  IR consulted for possible kyphoplasty.  PT/OT recommending SNF on discharge.  Assessment & Plan:  Principal Problem:   Recurrent falls Active Problems:   Lumbar compression fracture, closed, initial encounter ([place])   Atrial fibrillation, chronic (HCC)   Hypothyroidism   Macular degeneration of both eyes   DM type 2 with diabetic peripheral neuropathy ([place])   Memory loss  Recurrent falls: Worsening.  Came from home.  Also complained of bilateral lower extremity weakness, gait disturbance.  Patient also has macular degeneration.  CT head did not show any acute intracranial findings.  PT/OT consulted and recommended skilled nursing facility on discharge.  Back pain/compression fracture:  CT lumbar spine showed  acute compression fracture of the L3 vertebral body up to approximately 10% loss of vertebral body height centrally.neurosurgery consulted on admission, recommended TLSO. IR consulted for  kyphoplasty.  Imagings also showed previous compression fracture, other degenerative changes.Continue pain medication, supportive care.  Permanent A-fib: Remains in A-fib but controlled rate on Toprol, amiodarone.  On Eliquis for anticoagulation now on hold for kyphoplasty plan.  Diabetes type 2: Recent hemoglobin  of 7.4.  Takes metformin at home.  Currently on sliding scale insulin  Hypertension: Continue Toprol  Hypothyroidism: Continue Synthyroid  AKI: Mild: Continue monitoring  Glaucoma/macular degeneration: Might be contributing to the recurrent falls.  Receives Avastin injection as an outpatient.  Continue latanoprost  Confusion/memory loss: Suspected to have memory impairment.  Currently confused, delirious.  Most likely delirium.  Continue delirium precaution.  Started on low-dose Seroquel. Continue sitter if needed  Goals of care: Remains full code for now.      Nutrition Problem: Moderate Malnutrition Etiology: chronic illness    DVT prophylaxis:     Code Status: Full Code  Family Communication: Called spouse and discussed the plan on 5/19.Called agin today, call not received  Patient status: Inpatient  Patient is from : Home  Anticipated discharge to: Skilled nursing facility  Estimated DC date: not sure at the [facility] kyphoplasty   Consultants: IR  Procedures: None yet  Antimicrobials:  Anti-infectives (From admission, onward)    None       Subjective:  Patient seen and examined the bedside this morning.  Hemodynamically stable, remains confused but less agitated than yesterday.  Had a long discussion with wife on phone today  Objective: Vitals:   10/30/21 2318 10/31/21 0316 10/31/21 0318 10/31/21 0754  BP:  104/73  (!) 126/91  Pulse:  67  79  Resp:  17  16  Temp: 98.2 F (36.8 C) 97.7 F (36.5 C) 97.7 F (36.5 C) (!) 97.5 F (36.4 C)  TempSrc: Oral Axillary Axillary Oral  SpO2:  96% 97% 98%  Weight:      Height:        Intake/Output Summary (Last 24 hours) at 10/31/2021 1041 Last data filed at 10/31/2021 0820 Gross per 24 hour  Intake 360 ml  Output 500 ml  Net -140 ml   Filed Weights   10/28/21 1147 10/28/21 2052  Weight: 81.6 kg 77 kg    Examination:  General exam: Overall comfortable, not in distress HEENT:  PERRL Respiratory system:  no wheezes or crackles  Cardiovascular system: S1 & S2 heard, RRR.  Gastrointestinal system: Abdomen is nondistended, soft and nontender. Central nervous system: awake but confused Extremities: No edema, no clubbing ,no cyanosis.TLSO brace Skin: No rashes, no ulcers,no icterus      Data Reviewed: I have personally reviewed following labs and imaging studies  CBC: Recent Labs  Lab 10/28/21 1226 10/30/21 0343  WBC 9.6 8.6  NEUTROABS 7.8*  --   HGB 13.8 14.8  HCT 42.1 44.0  MCV 96.6 95.2  PLT 211 836   Basic Metabolic Panel: Recent Labs  Lab 10/28/21 1226 10/30/21 0343  NA 135 136  K 4.4 3.9  CL 102 102  CO2 24 25  GLUCOSE 176* 154*  BUN 19 23  CREATININE 1.42* 1.26*  CALCIUM 8.8* 8.9     No results found for this or any previous visit (from the past 240 hour(s)).   Radiology Studies: No results found.  Scheduled Meds:  amiodarone  200 mg Oral Daily   docusate sodium  100 mg Oral BID   famotidine  20 mg Oral Daily   feeding supplement  237 mL Oral BID BM   insulin aspart  0-15 Units Subcutaneous TID WC   latanoprost  1 drop Both Eyes QHS   levothyroxine  125 mcg Oral Q0600   lidocaine  1-3 patch Transdermal Q24H   metoprolol succinate  25 mg Oral q morning   multivitamin with minerals  1 tablet Oral Daily   QUEtiapine  25 mg Oral QHS   sodium chloride flush  3 mL Intravenous Q12H   Continuous Infusions:  methocarbamol (ROBAXIN) IV 500 mg (10/29/21 1539)     LOS: 3 days   Shelly Coss, MD [facility] P5/19/2023, 10:41 AM

## 2021-10-31 NOTE — Progress Notes (Signed)
Mobility Specialist: Progress Note   10/31/21 1134  Mobility  Activity Stood at bedside  Level of Assistance Moderate assist, patient does 50-74%  Assistive Device Front wheel walker  Activity Response Tolerated fair  $Mobility charge 1 Mobility   Pt received in the bed, anxious but agreeable to mobility. C/o pain when performing log roll to sit EOB, otherwise no pain throughout. ModA to sit EOB as well as to stand. Verbal cues for hand placement and posture throughout. Pt anxious about falling. Able to sit step towards [facility] after session. MinA to get back in the bed. Pt has call bell at his side and bed alarm is on.   [facility] Paislyn Domenico Mobility Specialist Mobility Specialist 5 North: [phone] Mobility Specialist 6 North: [phone]

## 2021-11-01 DIAGNOSIS — R296 Repeated falls: Secondary | ICD-10-CM | POA: Diagnosis not present

## 2021-11-01 LAB — GLUCOSE, CAPILLARY
Glucose-Capillary: 160 mg/dL — ABNORMAL HIGH (ref 70–99)
Glucose-Capillary: 148 mg/dL — ABNORMAL HIGH (ref 70–99)
Glucose-Capillary: 123 mg/dL — ABNORMAL HIGH (ref 70–99)
Glucose-Capillary: 98 mg/dL (ref 70–99)

## 2021-11-01 NOTE — Progress Notes (Signed)
RN trying to encourage patient to eat.  Pt ate none of his dinner.  RN was able to get patient to eat dinner roll with peanut butter added.  Will continue to monitor po intake

## 2021-11-01 NOTE — Progress Notes (Signed)
PROGRESS NOTE  Stephen Wilkinson  ZGY:[id number] DOB: 01-22-1929 DOA: 10/28/2021 PCP: Biagio Borg, MD   Brief Narrative:  Patient is a 88 year old male with history of diabetes type 2, glaucoma, hypertension, hyperlipidemia, paroxysmal A-fib, hypothyroidism who presented with recurrent falls from home.  Report of falling frequently, significant fall few days ago before admission with a lumbar compression fracture.  Since then more falls occurred and he was also complaining of lower extremity weakness.  CT lumbar spine showed  acute compression fracture of the L3 vertebral body up to approximately 10% loss of vertebral body height centrally.neurosurgery consulted on admission, recommended TLSO.  IR consulted for kyphoplasty,plan for next week.  PT/OT recommending SNF on discharge.  Assessment & Plan:  Principal Problem:   Recurrent falls Active Problems:   Lumbar compression fracture, closed, initial encounter ([place])   Atrial fibrillation, chronic (HCC)   Hypothyroidism   Macular degeneration of both eyes   DM type 2 with diabetic peripheral neuropathy ([place])   Memory loss  Recurrent falls: Worsening.  Came from home.  Also complained of bilateral lower extremity weakness, gait disturbance.  Patient also has macular degeneration.  CT head did not show any acute intracranial findings.  PT/OT consulted and recommended skilled nursing facility on discharge.  Back pain/compression fracture:  CT lumbar spine showed  acute compression fracture of the L3 vertebral body up to approximately 10% loss of vertebral body height centrally.neurosurgery consulted on admission, recommended TLSO. IR consulted for  kyphoplasty.  Imagings also showed previous compression fracture, other degenerative changes.Continue pain medication, supportive care.  Permanent A-fib: Remains in A-fib but controlled rate on Toprol, amiodarone.  On Eliquis for anticoagulation now on hold for kyphoplasty plan.  Diabetes type 2: Recent  hemoglobin of 7.4.  Takes metformin at home.  Currently on sliding scale insulin  Hypertension: Continue Toprol  Hypothyroidism: Continue Synthyroid  AKI: Mild: Continue monitoring  Glaucoma/macular degeneration: Might be contributing to the recurrent falls.  Receives Avastin injection as an outpatient.  Continue latanoprost  Confusion/memory loss: Suspected to have memory impairment.  Currently confused, delirious.  Most likely delirium.  Continue delirium precaution.  Started on low-dose Seroquel. Continue sitter if needed  Goals of care: Remains full code for now.      Nutrition Problem: Moderate Malnutrition Etiology: chronic illness    DVT prophylaxis:     Code Status: Full Code  Family Communication: Called spouse and discussed the plan on 5/19  Patient status: Inpatient  Patient is from : Home  Anticipated discharge to: Skilled nursing facility  Estimated DC date: After kyphoplasty   Consultants: IR  Procedures: None yet  Antimicrobials:  Anti-infectives (From admission, onward)    None       Subjective:  Patient seen and examined at the bedside this morning.  Hemodynamically stable, lying in bed.  Calm and cooperative but confused  Objective: Vitals:   10/31/21 2008 10/31/21 2341 11/01/21 0400 11/01/21 0840  BP: (!) 138/94 112/71 (!) 169/95 (!) 155/98  Pulse: 88 98 (!) 108 98  Resp: '17 20 19 18  '$ Temp: (!) 97.4 F (36.3 C) 98.1 F (36.7 C) 98.1 F (36.7 C) 98.1 F (36.7 C)  TempSrc: Oral Oral Axillary Oral  SpO2: 94% 97% 95% 94%  Weight:      Height:        Intake/Output Summary (Last 24 hours) at 11/01/2021 1112 Last data filed at 11/01/2021 0402 Gross per 24 hour  Intake 360 ml  Output 750 ml  Net -390 ml  Filed Weights   10/28/21 1147 10/28/21 2052  Weight: 81.6 kg 77 kg    Examination:  General exam: Overall comfortable, not in distress HEENT: PERRL Respiratory system:  no wheezes or crackles  Cardiovascular system:  Irregularly irregular rhythm Gastrointestinal system: Abdomen is nondistended, soft and nontender. Central nervous system: Alert and awake but not oriented to time Extremities: No edema, no clubbing ,no cyanosis, TLSO brace Skin: No rashes, no ulcers,no icterus       Data Reviewed: I have personally reviewed following labs and imaging studies  CBC: Recent Labs  Lab 10/28/21 1226 10/30/21 0343  WBC 9.6 8.6  NEUTROABS 7.8*  --   HGB 13.8 14.8  HCT 42.1 44.0  MCV 96.6 95.2  PLT 211 540   Basic Metabolic Panel: Recent Labs  Lab 10/28/21 1226 10/30/21 0343  NA 135 136  K 4.4 3.9  CL 102 102  CO2 24 25  GLUCOSE 176* 154*  BUN 19 23  CREATININE 1.42* 1.26*  CALCIUM 8.8* 8.9     No results found for this or any previous visit (from the past 240 hour(s)).   Radiology Studies: No results found.  Scheduled Meds:  amiodarone  200 mg Oral Daily   docusate sodium  100 mg Oral BID   famotidine  20 mg Oral Daily   feeding supplement  237 mL Oral BID BM   insulin aspart  0-15 Units Subcutaneous TID WC   latanoprost  1 drop Both Eyes QHS   levothyroxine  125 mcg Oral Q0600   lidocaine  1-3 patch Transdermal Q24H   metoprolol succinate  25 mg Oral q morning   multivitamin with minerals  1 tablet Oral Daily   QUEtiapine  25 mg Oral QHS   sodium chloride flush  3 mL Intravenous Q12H   Continuous Infusions:  methocarbamol (ROBAXIN) IV 500 mg (10/29/21 1539)     LOS: 4 days   Shelly Coss, MD [facility] P5/20/2023, 11:12 AM

## 2021-11-01 NOTE — Progress Notes (Signed)
Patient's wife requested to see provider regarding procedure time on Monday, who will do the procedure etc. However, Dr Tawanna Solo was notified and at that time he was busy with other patient and he requested to come and see her in little while but his wife got very upset and get angry towards me and left hospital, saying that, let doctor know to call her. MD Tawanna Solo was notified.

## 2021-11-02 DIAGNOSIS — R296 Repeated falls: Secondary | ICD-10-CM | POA: Diagnosis not present

## 2021-11-02 LAB — BASIC METABOLIC PANEL
Anion gap: 11 (ref 5–15)
BUN: 32 mg/dL — ABNORMAL HIGH (ref 8–23)
CO2: 23 mmol/L (ref 22–32)
Calcium: 9 mg/dL (ref 8.9–10.3)
Chloride: 106 mmol/L (ref 98–111)
Creatinine, Ser: 1.6 mg/dL — ABNORMAL HIGH (ref 0.61–1.24)
GFR, Estimated: 40 mL/min — ABNORMAL LOW (ref >60)
Glucose, Bld: 120 mg/dL — ABNORMAL HIGH (ref 70–99)
Potassium: 4.1 mmol/L (ref 3.5–5.1)
Sodium: 140 mmol/L (ref 135–145)

## 2021-11-02 LAB — GLUCOSE, CAPILLARY
Glucose-Capillary: 146 mg/dL — ABNORMAL HIGH (ref 70–99)
Glucose-Capillary: 205 mg/dL — ABNORMAL HIGH (ref 70–99)
Glucose-Capillary: 255 mg/dL — ABNORMAL HIGH (ref 70–99)
Glucose-Capillary: 177 mg/dL — ABNORMAL HIGH (ref 70–99)

## 2021-11-02 LAB — CBC
HCT: 43.7 % (ref 39.0–52.0)
Hemoglobin: 14.5 g/dL (ref 13.0–17.0)
MCH: 31.8 pg (ref 26.0–34.0)
MCHC: 33.2 g/dL (ref 30.0–36.0)
MCV: 95.8 fL (ref 80.0–100.0)
Platelets: 274 10*3/uL (ref 150–400)
RBC: 4.56 MIL/uL (ref 4.22–5.81)
RDW: 13.1 % (ref 11.5–15.5)
WBC: 8 10*3/uL (ref 4.0–10.5)
nRBC: 0 % (ref 0.0–0.2)

## 2021-11-02 MED ORDER — SODIUM CHLORIDE 0.9 % IV SOLN: INTRAVENOUS | Status: DC

## 2021-11-02 NOTE — Progress Notes (Signed)
Mobility Specialist Progress Note   11/02/21 1300  Mobility  Activity Ambulated with assistance in room  Level of Assistance Moderate assist, patient does 50-74%  Assistive Device Front wheel walker  Distance Ambulated (ft) 24 ft (12+12)  Activity Response Tolerated well  $Mobility charge 1 Mobility   Pre Mobility: 91 HR, BP, 97%  SpO2 During Mobility: 112 HR, 92% SpO2 Post Mobility: 101 HR, 96% SpO2  Received in bed having a slight HA but agreeable. Requiring minA to get EOB d/t trunk weakness and to maintain back precautions. As well as minA to Gio lumbar corset. Able to stand from an elevated surface w/ modA, trunk in flexed position when standing, pt requiring incr time and VC to correct posture. X3 seated rest breaks d/t general weakness and instability but pt building "confidence" each bout in the room. Returned back to bed w/o fault, call bell placed by side and NT left in room.  [place]land Falling Mobility Specialist Phone Number [phone]

## 2021-11-02 NOTE — Progress Notes (Signed)
PROGRESS NOTE  Stephen Wilkinson  OMV:[id number] DOB: 1929/03/18 DOA: 10/28/2021 PCP: Biagio Borg, MD   Brief Narrative:  Patient is a 88 year old male with history of diabetes type 2, glaucoma, hypertension, hyperlipidemia, paroxysmal A-fib, hypothyroidism who presented with recurrent falls from home.  Report of falling frequently, significant fall few days ago before admission with a lumbar compression fracture.  Since then more falls occurred and he was also complaining of lower extremity weakness.  CT lumbar spine showed  acute compression fracture of the L3 vertebral body up to approximately 10% loss of vertebral body height centrally.neurosurgery consulted on admission, recommended TLSO.  IR consulted for kyphoplasty,plan for next week.  PT/OT recommending SNF on discharge.  Assessment & Plan:  Principal Problem:   Recurrent falls Active Problems:   Lumbar compression fracture, closed, initial encounter ([place])   Atrial fibrillation, chronic (HCC)   Hypothyroidism   Macular degeneration of both eyes   DM type 2 with diabetic peripheral neuropathy ([place])   Memory loss  Recurrent falls: Worsening.  Came from home.  Also complained of bilateral lower extremity weakness, gait disturbance.  Patient also has macular degeneration.  CT head did not show any acute intracranial findings.  PT/OT consulted and recommended skilled nursing facility on discharge.  Back pain/compression fracture:  CT lumbar spine showed  acute compression fracture of the L3 vertebral body up to approximately 10% loss of vertebral body height centrally.neurosurgery consulted on admission, recommended TLSO. IR consulted for  kyphoplasty.  Imagings also showed previous compression fracture, other degenerative changes.Continue pain medication, supportive care.  Permanent A-fib: Remains in A-fib but controlled rate on Toprol, amiodarone.  On Eliquis for anticoagulation now on hold for kyphoplasty plan.  Diabetes type 2: Recent  hemoglobin of 7.4.  Takes metformin at home.  Currently on sliding scale insulin  Hypertension: Continue Toprol  Hypothyroidism: Continue Synthyroid  AKI: Mild: Continue monitoring  Glaucoma/macular degeneration: Might be contributing to the recurrent falls.  Receives Avastin injection as an outpatient.  Continue latanoprost  Confusion/memory loss: Suspected to have memory impairment.  Currently confused, delirious.  Most likely delirium.  Continue delirium precaution.  Started on low-dose Seroquel but now d/ced due to increased sleepiness  Goals of care: Remains full code for now.      Nutrition Problem: Moderate Malnutrition Etiology: chronic illness    DVT prophylaxis:     Code Status: Full Code  Family Communication: Called spouse and discussed the plan on 5/19.Called again today,call not received  Patient status: Inpatient  Patient is from : Home  Anticipated discharge to: Skilled nursing facility  Estimated DC date: After kyphoplasty   Consultants: IR  Procedures: None yet  Antimicrobials:  Anti-infectives (From admission, onward)    None       Subjective:  Patient seen and examined at the bedside this morning.  Hemodynamically stable.  Overall comfortable, no new complaints pain. well controlled on the back  Objective: Vitals:   11/01/21 1944 11/01/21 2336 11/02/21 0400 11/02/21 0807  BP: (!) 1'35/96 99/64 94/69 '$ 102/76  Pulse: 75 88 70 77  Resp: '16 14 16 '$ (!) 22  Temp: (!) 97.4 F (36.3 C) 98.8 F (37.1 C) 97.6 F (36.4 C) 98.1 F (36.7 C)  TempSrc: Oral Axillary Axillary Oral  SpO2: 97% 93% 94% 95%  Weight:      Height:        Intake/Output Summary (Last 24 hours) at 11/02/2021 1057 Last data filed at 11/02/2021 0403 Gross per 24 hour  Intake --  Output 450 ml  Net -450 ml   Filed Weights   10/28/21 1147 10/28/21 2052  Weight: 81.6 kg 77 kg    Examination:  General exam: Overall comfortable, not in distress, very  deconditioned HEENT: PERRL Respiratory system:  no wheezes or crackles  Cardiovascular system: Irregularly irregular rhythm Gastrointestinal system: Abdomen is nondistended, soft and nontender. Central nervous system: Alert and awake, oriented to place Extremities: No edema, no clubbing ,no cyanosis MSK: TLSO brace Skin: No rashes, no ulcers,no icterus     Data Reviewed: I have personally reviewed following labs and imaging studies  CBC: Recent Labs  Lab 10/28/21 1226 10/30/21 0343 11/02/21 0226  WBC 9.6 8.6 8.0  NEUTROABS 7.8*  --   --   HGB 13.8 14.8 14.5  HCT 42.1 44.0 43.7  MCV 96.6 95.2 95.8  PLT 211 216 671   Basic Metabolic Panel: Recent Labs  Lab 10/28/21 1226 10/30/21 0343 11/02/21 0226  NA 135 136 140  K 4.4 3.9 4.1  CL 102 102 106  CO2 '24 25 23  '$ GLUCOSE 176* 154* 120*  BUN 19 23 32*  CREATININE 1.42* 1.26* 1.60*  CALCIUM 8.8* 8.9 9.0     No results found for this or any previous visit (from the past 240 hour(s)).   Radiology Studies: No results found.  Scheduled Meds:  amiodarone  200 mg Oral Daily   docusate sodium  100 mg Oral BID   famotidine  20 mg Oral Daily   feeding supplement  237 mL Oral BID BM   insulin aspart  0-15 Units Subcutaneous TID WC   latanoprost  1 drop Both Eyes QHS   levothyroxine  125 mcg Oral Q0600   lidocaine  1-3 patch Transdermal Q24H   metoprolol succinate  25 mg Oral q morning   multivitamin with minerals  1 tablet Oral Daily   QUEtiapine  25 mg Oral QHS   sodium chloride flush  3 mL Intravenous Q12H   Continuous Infusions:  sodium chloride 75 mL/hr at 11/02/21 0850   methocarbamol (ROBAXIN) IV 500 mg (10/29/21 1539)     LOS: 5 days   Shelly Coss, MD [facility] P5/21/2023, 10:57 AM

## 2021-11-02 NOTE — Plan of Care (Signed)

## 2021-11-02 NOTE — Plan of Care (Signed)
  Problem: Education: Goal: Knowledge of General Education information will improve Description: Including pain rating scale, medication(s)/side effects and non-pharmacologic comfort measures 11/02/2021 2227 by Gwyndolyn Kaufman, RN Outcome: Progressing 11/02/2021 2227 by Gwyndolyn Kaufman, RN Outcome: Progressing   Problem: Health Behavior/Discharge Planning: Goal: Ability to manage health-related needs will improve 11/02/2021 2227 by Gwyndolyn Kaufman, RN Outcome: Progressing 11/02/2021 2227 by Gwyndolyn Kaufman, RN Outcome: Progressing   Problem: Clinical Measurements: Goal: Ability to maintain clinical measurements within normal limits will improve 11/02/2021 2227 by Gwyndolyn Kaufman, RN Outcome: Progressing 11/02/2021 2227 by Gwyndolyn Kaufman, RN Outcome: Progressing Goal: Will remain free from infection 11/02/2021 2227 by Gwyndolyn Kaufman, RN Outcome: Progressing 11/02/2021 2227 by Gwyndolyn Kaufman, RN Outcome: Progressing Goal: Diagnostic test results will improve 11/02/2021 2227 by Gwyndolyn Kaufman, RN Outcome: Progressing 11/02/2021 2227 by Gwyndolyn Kaufman, RN Outcome: Progressing Goal: Respiratory complications will improve 11/02/2021 2227 by Gwyndolyn Kaufman, RN Outcome: Progressing 11/02/2021 2227 by Gwyndolyn Kaufman, RN Outcome: Progressing Goal: Cardiovascular complication will be avoided 11/02/2021 2227 by Gwyndolyn Kaufman, RN Outcome: Progressing 11/02/2021 2227 by Gwyndolyn Kaufman, RN Outcome: Progressing   Problem: Activity: Goal: Risk for activity intolerance will decrease 11/02/2021 2227 by Gwyndolyn Kaufman, RN Outcome: Progressing 11/02/2021 2227 by Gwyndolyn Kaufman, RN Outcome: Progressing   Problem: Nutrition: Goal: Adequate nutrition will be maintained 11/02/2021 2227 by Gwyndolyn Kaufman, RN Outcome: Progressing 11/02/2021 2227 by Gwyndolyn Kaufman, RN Outcome: Progressing   Problem: Coping: Goal: Level of anxiety will decrease 11/02/2021 2227 by Gwyndolyn Kaufman, RN Outcome: Progressing 11/02/2021 2227 by Gwyndolyn Kaufman, RN Outcome: Progressing   Problem: Elimination: Goal: Will not experience complications related to bowel motility 11/02/2021 2227 by Gwyndolyn Kaufman, RN Outcome: Progressing 11/02/2021 2227 by Gwyndolyn Kaufman, RN Outcome: Progressing Goal: Will not experience complications related to urinary retention 11/02/2021 2227 by Gwyndolyn Kaufman, RN Outcome: Progressing 11/02/2021 2227 by Gwyndolyn Kaufman, RN Outcome: Progressing   Problem: Pain Managment: Goal: General experience of comfort will improve 11/02/2021 2227 by Gwyndolyn Kaufman, RN Outcome: Progressing 11/02/2021 2227 by Gwyndolyn Kaufman, RN Outcome: Progressing   Problem: Safety: Goal: Ability to remain free from injury will improve 11/02/2021 2227 by Gwyndolyn Kaufman, RN Outcome: Progressing 11/02/2021 2227 by Gwyndolyn Kaufman, RN Outcome: Progressing   Problem: Skin Integrity: Goal: Risk for impaired skin integrity will decrease 11/02/2021 2227 by Gwyndolyn Kaufman, RN Outcome: Progressing 11/02/2021 2227 by Gwyndolyn Kaufman, RN Outcome: Progressing

## 2021-11-03 DIAGNOSIS — R296 Repeated falls: Secondary | ICD-10-CM | POA: Diagnosis not present

## 2021-11-03 LAB — BASIC METABOLIC PANEL
Anion gap: 7 (ref 5–15)
BUN: 37 mg/dL — ABNORMAL HIGH (ref 8–23)
CO2: 23 mmol/L (ref 22–32)
Calcium: 8.5 mg/dL — ABNORMAL LOW (ref 8.9–10.3)
Chloride: 109 mmol/L (ref 98–111)
Creatinine, Ser: 1.67 mg/dL — ABNORMAL HIGH (ref 0.61–1.24)
GFR, Estimated: 38 mL/min — ABNORMAL LOW (ref >60)
Glucose, Bld: 147 mg/dL — ABNORMAL HIGH (ref 70–99)
Potassium: 4 mmol/L (ref 3.5–5.1)
Sodium: 139 mmol/L (ref 135–145)

## 2021-11-03 LAB — GLUCOSE, CAPILLARY
Glucose-Capillary: 134 mg/dL — ABNORMAL HIGH (ref 70–99)
Glucose-Capillary: 262 mg/dL — ABNORMAL HIGH (ref 70–99)
Glucose-Capillary: 122 mg/dL — ABNORMAL HIGH (ref 70–99)
Glucose-Capillary: 255 mg/dL — ABNORMAL HIGH (ref 70–99)

## 2021-11-03 MED ORDER — CEFAZOLIN SODIUM-DEXTROSE 2-4 GM/100ML-% IV SOLN: 2.0000 g | INTRAVENOUS | Status: AC

## 2021-11-03 NOTE — TOC Progression Note (Addendum)
Transition of Care [facility]) - Progression Note    Patient Details  Name: Stephen Wilkinson MRN: [id number] Date of Birth: 06-Mar-1929  Transition of Care [facility] CM/SW [place], [place] Phone Number: 11/03/2021, 12:15 PM  Clinical Narrative:    CSW was notified pt will be ready to DC on Wednesday. CSW called spouse to provide bed offers, VM left. Pt will not need an authorization. TOC will continue to follow for DC needs.  1:45 CSW spoke with pt's spouse who noted a preference for [facility] or [facility]. She wants pt to be involved with the decision. CSW noted pt is oriented x1 today. CSW to re evaluate tomorrow and follow up with pt and spouse for final decision. TOC will continue to follow for DC needs.  Expected Discharge Plan: [place] Barriers to Discharge: Continued Medical Work up, SNF Pending bed offer  Expected Discharge Plan and Services Expected Discharge Plan: [place] In-house Referral: Clinical Social Work     Living arrangements for the past 2 months: Single Family Home                                       Social Determinants of Health (SDOH) Interventions    Readmission Risk Interventions     View : No data to display.

## 2021-11-03 NOTE — Consult Note (Signed)
Chief Complaint: Patient was seen in consultation today for Lumbar 3 Kyphoplasty Chief Complaint  Patient presents with   Fall   at the request of Dr Dessie Coma  Supervising Physician: Dr Delma Post  Patient Status: [facility] - In-pt  History of Present Illness: Stephen Wilkinson is a 88 y.o. male   DM; HTN; HLD; Afib- on Eliquis - (LD 5 days ago) Multiple falls at home Weakness progressing CT Lumbar 10/28/21: IMPRESSION: 1. Acute compression fracture of the L3 vertebral body up to approximately 10% loss of vertebral body height centrally. Fracture planes extend to involve the bilateral pedicles, but there is no bony retropulsion or definite epidural hematoma. 2. Chronic compression deformity of the L2 vertebral body status post vertebral augmentation. 3. Status post posterior decompression at L3-L4 through L5-S1. 4. Multilevel degenerative changes as above resulting in at least moderate spinal canal stenosis at L2-L3 and multilevel neural foraminal stenosis.  MD requesting L3 KP Dr Gerhard Perches has reviewed imagines and approves procedure Insurance has been pre authorized Off Eliquis 5 days per Dr Gerhard Perches request   Past Medical History:  Diagnosis Date   Anxiety    B12 deficiency 09/23/2016   Blood transfusion without reported diagnosis    Cancer ([place])    basal and squamous skin carcinoma    Cataract    Depression    Diabetes mellitus    GERD (gastroesophageal reflux disease)    Glaucoma 01/04/2013   Hx of transient ischemic attack (TIA) 01/04/2013   Hx: UTI (urinary tract infection)    Hyperlipidemia    Hypertension    Hypothyroidism    Insomnia    Macular degeneration    Neuropathy    OA (osteoarthritis)    Pneumonia    PVC's (premature ventricular contractions)    Renal mass    Stroke ([place])    mid 1990s     Past Surgical History:  Procedure Laterality Date   APPENDECTOMY     CARDIOVERSION N/A 07/30/2021   Procedure: CARDIOVERSION;  Surgeon: Josue Hector,  MD;  Location: [facility];  Service: Cardiovascular;  Laterality: N/A;   CATARACT EXTRACTION W/ INTRAOCULAR LENS  IMPLANT, BILATERAL     INGUINAL HERNIA REPAIR     X3   LUMBAR LAMINECTOMY/DECOMPRESSION MICRODISCECTOMY N/A 02/12/2017   Procedure: Lumbar Three-Four, Lumbar Four-Five, Lumbar Five-Sacral One Laminectomy and Foraminotomy;  Surgeon: Earnie Larsson, MD;  Location: [place];  Service: Neurosurgery;  Laterality: N/A;   OTHER SURGICAL HISTORY     microwave procedure for prostate - 09/12    PROSTATE ABLATION     microwave   REPLACEMENT TOTAL KNEE     THYROIDECTOMY     TONSILLECTOMY AND ADENOIDECTOMY     TOTAL KNEE ARTHROPLASTY  05/11/2011   Procedure: TOTAL KNEE ARTHROPLASTY;  Surgeon: Gearlean Alf;  Location: [facility];  Service: Orthopedics;  Laterality: Right;   US ECHOCARDIOGRAPHY  11/10/2006   EF 55-60%   US ECHOCARDIOGRAPHY  09/20/2002   EF 65-70%    Allergies: Patient has no known allergies.  Medications: Prior to Admission medications   Medication Sig Start Date End Date Taking? Authorizing Provider  amiodarone (PACERONE) 200 MG tablet Take 1 tablet (200 mg total) by mouth daily. 09/01/21  Yes Crenshaw, Denice Bors, MD  apixaban (ELIQUIS) 5 MG TABS tablet Take 1 tablet (5 mg total) by mouth 2 (two) times daily. 09/11/21  Yes Crenshaw, Denice Bors, MD  Bevacizumab (AVASTIN IV) Place 1 Syringe into the right eye See admin instructions. 1 dose injected  in the right eye every 7 weeks   Yes [provider]  famotidine (PEPCID) 20 MG tablet Take 1 tablet (20 mg total) by mouth 2 (two) times daily. Patient taking differently: Take 20 mg by mouth in the morning. 04/10/20  Yes Biagio Borg, MD  latanoprost (XALATAN) 0.005 % ophthalmic solution Place 1 drop into both eyes at bedtime. 01/16/21 01/16/22 Yes Rankin, Clent Demark, MD  levothyroxine (SYNTHROID) 125 MCG tablet TAKE 1 TABLET EVERY DAY BEFORE BREAKFAST Patient taking differently: Take 125 mcg by mouth daily before breakfast. 06/24/21  Yes  Biagio Borg, MD  metFORMIN (GLUCOPHAGE) 1000 MG tablet TAKE 1 TABLET EVERY DAY Patient taking differently: Take 1,000 mg by mouth daily with breakfast. 04/29/21  Yes Biagio Borg, MD  metoprolol succinate (TOPROL-XL) 25 MG 24 hr tablet Take 1 tablet (25 mg total) by mouth every morning. 05/30/21  Yes [name], MD  Multiple Vitamins-Minerals (ICAPS AREDS 2 PO) Take 2 capsules by mouth daily.   Yes [provider]  glucose blood (ONE TOUCH ULTRA TEST) test strip Use as directed once daily to check blood sugar.  Diagnosis code E11.42 10/09/20   Biagio Borg, MD     Family History  Problem Relation Age of Onset   Hypertension Mother    Arthritis Mother    Diabetes Mother    Hypertension Father    Stroke Father    Kidney disease Father    Hypertension Brother    Diabetes Brother     Social History   Socioeconomic History   Marital status: Married    Spouse name: Not on file   Number of children: 3   Years of education: Not on file   Highest education level: Not on file  Occupational History   Occupation: retired    Fish farm manager: RETIRED    Comment: [employer]  Tobacco Use   Smoking status: Never   Smokeless tobacco: Never  Vaping Use   Vaping Use: Never used  Substance and Sexual Activity   Alcohol use: Yes    Alcohol/week: 7.0 standard drinks    Types: 7 Shots of liquor per week    Comment: 2 drinks most days   Drug use: No   Sexual activity: Never    Birth control/protection: None  Other Topics Concern   Not on file  Social History Narrative   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  All other systems are negative.  Review of Systems  Constitutional:  Positive for activity change. Negative for fever.  Respiratory:  Negative for cough and shortness of breath.   Cardiovascular:  Negative for chest pain.  Gastrointestinal:  Negative for nausea.  Musculoskeletal:  Positive for back pain.  Neurological:  Positive for weakness.  Psychiatric/Behavioral:  Positive for confusion. Negative for behavioral problems.    Vital Signs: BP 107/90 (BP  Location: Left Arm)   Pulse 80   Temp (!) 97.4 F (36.3 C) (Oral)   Resp 20   Ht 6' 1.5" (1.867 m)   Wt 169 lb 12.1 oz (77 kg)   SpO2 96%   BMI 22.09 kg/m   Physical Exam Vitals reviewed.  HENT:     Mouth/Throat:     Mouth: Mucous membranes are moist.  Cardiovascular:     Rate and Rhythm: Normal rate. Rhythm irregular.     Heart sounds: Normal heart sounds.  Pulmonary:     Effort: Pulmonary effort is normal.     Breath sounds: Normal breath sounds.  Abdominal:     Palpations: Abdomen is soft.  Musculoskeletal:        General: Normal range of motion.     Comments: Low back pain  Mic to low back  Skin:    General: Skin is warm.  Neurological:     Mental Status: He is alert and oriented to person, place, and time.  Psychiatric:     Comments: Spoke to wife Pt is minimally confused She is agreeable to consent for procedure    Imaging: CT Head Wo Contrast  Result Date: 10/28/2021 CLINICAL DATA:  Trauma slid off couch EXAM: CT HEAD WITHOUT CONTRAST TECHNIQUE: Contiguous axial images were obtained from the base of the skull through the vertex without intravenous contrast. RADIATION DOSE REDUCTION: This exam was performed according to the departmental dose-optimization program which includes automated exposure control, adjustment of the mA and/or kV according to patient size and/or use of  iterative reconstruction technique. COMPARISON:  06/08/2014 correlation is also made with MRI head 11/02/2018 FINDINGS: Brain: No evidence of acute infarction, hemorrhage, cerebral edema, mass, mass effect, or midline shift. No hydrocephalus or extra-axial fluid collection. Generalized cerebral and cerebellar atrophy, with prominent ventricles. Periventricular white matter changes, likely the sequela of chronic small vessel ischemic disease. Vascular: No hyperdense vessel. Skull: Normal. Negative for fracture or focal lesion. Sinuses/Orbits: Mucosal thickening in the left-greater-than-right maxillary sinus. Status post bilateral lens replacements. Other: The mastoid air cells are well aerated. IMPRESSION: No acute intracranial process. Electronically Signed   By: Merilyn Baba M.D.   On: 10/28/2021 12:32   CT Lumbar Spine Wo Contrast  Result Date: 10/28/2021 CLINICAL DATA:  Fall, lower back pain EXAM: CT LUMBAR SPINE WITHOUT CONTRAST TECHNIQUE: Multidetector CT imaging of the lumbar spine was performed without intravenous contrast administration. Multiplanar CT image reconstructions were also generated. RADIATION DOSE REDUCTION: This exam was performed according to the departmental dose-optimization program which includes automated exposure control, adjustment of the mA and/or kV according to patient size and/or use of iterative reconstruction technique. COMPARISON:  Lumbar spine MRI 11/28/2019 FINDINGS: Segmentation: Standard; the lowest formed disc space is designated L5-S1. Alignment: There is trace retrolisthesis of L2 on L3 and grade 1 anterolisthesis of L5 on S1, similar to the prior MRI. Alignment is otherwise normal. There is no evidence of traumatic malalignment. Vertebrae: Compression deformity of the L2 vertebral body with up to approximately 50% loss of vertebral body height is similar in extent compared to the MRI from 2021, with interval vertebral augmentation. There is an acute compression  fracture of the L3 vertebral body with fracture planes extending from the anterior endplate to the superior endplate as well as into posterior endplate and bilateral pedicles (12-52, 12-32). There is no bony retropulsion or definite epidural hematoma. There is up to approximately 10% loss of vertebral body height centrally. The other vertebral body heights are  preserved. There are flowing osteophytes throughout the lower thoracic spine. The bones are diffusely demineralized. Paraspinal and other soft tissues: Coronary artery and aortic valve calcifications are noted. There is calcified atherosclerotic plaque throughout the imaged aorta. There is fatty atrophy of the pancreas. Paraspinal soft tissues are unremarkable. Disc levels: T12-L1: No significant spinal canal or neural foraminal stenosis L1-L2: There is a mild disc bulge and mild bilateral facet arthropathy without significant spinal canal or neural foraminal stenosis L2-L3: There is a disc bulge eccentric to the left, degenerative endplate change, and bilateral facet arthropathy resulting in moderate to severe left and moderate right neural foraminal stenosis and at least moderate spinal canal stenosis L3-L4: Status post posterior decompression. There is a diffuse disc bulge, degenerative endplate change, and bilateral facet arthropathy resulting in severe bilateral neural foraminal stenosis and likely moderate spinal canal stenosis L4-L5: Status post posterior decompression. There is a mild disc bulge, degenerative endplate change, and bilateral facet arthropathy resulting in moderate bilateral neural foraminal stenosis without evidence of significant spinal canal stenosis L5-S1: Status post posterior decompression. There is grade 1 anterolisthesis with a mild disc bulge and bilateral facet arthropathy resulting in moderate to severe bilateral neural foraminal stenosis without evidence of significant spinal canal stenosis. IMPRESSION: 1. Acute compression  fracture of the L3 vertebral body up to approximately 10% loss of vertebral body height centrally. Fracture planes extend to involve the bilateral pedicles, but there is no bony retropulsion or definite epidural hematoma. 2. Chronic compression deformity of the L2 vertebral body status post vertebral augmentation. 3. Status post posterior decompression at L3-L4 through L5-S1. 4. Multilevel degenerative changes as above resulting in at least moderate spinal canal stenosis at L2-L3 and multilevel neural foraminal stenosis. Electronically Signed   By: Valetta Mole M.D.   On: 10/28/2021 12:39    Labs:  CBC: Recent Labs    09/16/21 0724 10/28/21 1226 10/30/21 0343 11/02/21 0226  WBC 8.4 9.6 8.6 8.0  HGB 15.6 13.8 14.8 14.5  HCT 46.2 42.1 44.0 43.7  PLT 210 211 216 274    COAGS: Recent Labs    09/16/21 0724  INR 1.1    BMP: Recent Labs    10/28/21 1226 10/30/21 0343 11/02/21 0226 11/03/21 0141  NA 135 136 140 139  K 4.4 3.9 4.1 4.0  CL 102 102 106 109  CO2 '24 25 23 23  '$ GLUCOSE 176* 154* 120* 147*  BUN 19 23 32* 37*  CALCIUM 8.8* 8.9 9.0 8.5*  CREATININE 1.42* 1.26* 1.60* 1.67*  GFRNONAA 47* 54* 40* 38*    LIVER FUNCTION TESTS: Recent Labs    05/21/21 1320 08/27/21 1204 09/16/21 0724 10/28/21 1226  BILITOT 0.9 0.8 0.7 1.5*  AST '25 14 15 20  '$ ALT '12 9 10 15  '$ ALKPHOS 75 69 61 82  PROT 7.2 6.9 7.0 6.8  ALBUMIN 4.3 4.5 4.2 3.4*    TUMOR MARKERS: No results for input(s): AFPTM, CEA, CA199, CHROMGRNA in the last 8760 hours.  Assessment and Plan:  Painful acute Lumbar 3 fracture Scheduled for LE Kyphoplasty 5/23 in IR Has been off Eliquis 5 days Risks and benefits of Lumbar 3 Kyphoplasty were discussed with the patient and his wife Lanelle Bal via phone including, but not limited to education regarding the natural healing process of compression fractures without intervention, bleeding, infection, cement migration which may cause spinal cord damage, paralysis, pulmonary  embolism or even death.  This interventional procedure involves the use of X-rays and because of the nature of  the planned procedure, it is possible that we will have prolonged use of X-ray fluoroscopy.  Potential radiation risks to you include (but are not limited to) the following: - A slightly elevated risk for cancer  several years later in life. This risk is typically less than 0.5% percent. This risk is low in comparison to the normal incidence of human cancer, which is 33% for women and 50% for men according to the [place]. - Radiation induced injury can include skin redness, resembling a rash, tissue breakdown / ulcers and hair loss (which can be temporary or permanent).   The likelihood of either of these occurring depends on the difficulty of the procedure and whether you are sensitive to radiation due to previous procedures, disease, or genetic conditions.   IF your procedure requires a prolonged use of radiation, you will be notified and given written instructions for further action.  It is your responsibility to monitor the irradiated area for the 2 weeks following the procedure and to notify your physician if you are concerned that you have suffered a radiation induced injury.    All of the patient's questions were answered, patient is agreeable to proceed.  Consent signed and in chart.   Thank you for this interesting consult.  I greatly enjoyed meeting Khyrie Masi and look forward to participating in their care.  A copy of this report was sent to the requesting provider on this date.  Electronically Signed: Lavonia Drafts, PA-C 11/03/2021, 7:40 AM   I spent a total of 40 Minutes    in face to face in clinical consultation, greater than 50% of which was counseling/coordinating care for L3 KP

## 2021-11-03 NOTE — Progress Notes (Addendum)
Pt is being physically combative; started out being verbally aggressive and threatening staff; staff left pt room and observed him from door; pt then started ripping off telemetry and pulled iv pole over onto bed and RN had to enter the room for safety.  Pt tried to throw call bell at staff; shoot [place] with his electric gun as he called it (aka, fan), and kicking with both legs. Several staff members called into room to help remove objects from patient for staff safety.  MD notified  Patient given '2mg'$  of Haldol for agiation.  Patient appears to be calm and resting peacefully.

## 2021-11-03 NOTE — Progress Notes (Signed)
Pt extremely agitated and confused.  Threatening staff with violence if they come close to him.  Patient has remote in one hand and fan in the other and starts banging them on the siderails when staff enter the room.  Pt is verbally aggressive.  RN, NT, and charge nurse observing him from the doorway.  Trying to let patient calm down or fall asleep before reapproaching.  Patient is safe in bed at this time.

## 2021-11-03 NOTE — Progress Notes (Signed)
Physical Therapy Treatment Patient Details Name: Stephen Wilkinson MRN: [id number] DOB: 1928-09-10 Today's Date: 11/03/2021   History of Present Illness 88 y.o. male presents to [facility] hospital on 10/28/2021 with recurrent falls and LE weakness. Pt diagnosed 5/10 with a lumbar compression fx. PMH includes DM; glaucoma; HTN; HLD; afib; and hypothyroidism.    PT Comments    Pt tolerates treatment well, with improved awareness and memory compared to previous session with this PT, although cognition does remain grossly impaired. Pt is able to demonstrate improved tolerance for ambulation, walking for multiple bouts during session. Pt shows difficulty transitioning from sit to stand, seemingly unable to do so without PT verbal cues to improve technique. Pt will benefit from continued acute PT services in an effort to reduce falls risk and caregiver burden. PT continues to recommend SNF placement at this time as the pt has little physical assistance available at home and remains at a high risk for falls.   Recommendations for follow up therapy are one component of a multi-disciplinary discharge planning process, led by the attending physician.  Recommendations may be updated based on patient status, additional functional criteria and insurance authorization.  Follow Up Recommendations  Skilled nursing-short term rehab (<3 hours/day)     Assistance Recommended at Discharge Frequent or constant Supervision/Assistance  Patient can return home with the following A lot of help with walking and/or transfers;A little help with bathing/dressing/bathroom;Assistance with cooking/housework;Direct supervision/assist for medications management;Direct supervision/assist for financial management;Assist for transportation;Help with stairs or ramp for entrance   Equipment Recommendations  None recommended by PT    Recommendations for Other Services       Precautions / Restrictions Precautions Precautions:  Fall;Back Precaution Booklet Issued: Yes (comment) Precaution Comments: compression fx, TLSO at all times other than when showering Required Braces or Orthoses: Spinal Brace Spinal Brace: Thoracolumbosacral orthotic;Applied in sitting position Restrictions Weight Bearing Restrictions: No     Mobility  Bed Mobility Overal bed mobility: Needs Assistance Bed Mobility: Rolling, Sidelying to Sit Rolling: Min assist Sidelying to sit: Min assist            Transfers Overall transfer level: Needs assistance Equipment used: Rolling walker (2 wheels) Transfers: Sit to/from Stand Sit to Stand: Min assist, From elevated surface           General transfer comment: pt requires cues for hand placement and to increase forward lean    Ambulation/Gait Ambulation/Gait assistance: Min guard Gait Distance (Feet): 20 Feet (20' x 2) Assistive device: Rolling walker (2 wheels) Gait Pattern/deviations: Step-through pattern Gait velocity: reduced Gait velocity interpretation: <1.8 ft/sec, indicate of risk for recurrent falls   General Gait Details: pt with slowed step-through gait, drift to left side   Stairs             Wheelchair Mobility    Modified Rankin (Stroke Patients Only)       Balance Overall balance assessment: Needs assistance Sitting-balance support: No upper extremity supported, Feet supported Sitting balance-Leahy Scale: Good     Standing balance support: Bilateral upper extremity supported, Reliant on assistive device for balance Standing balance-Leahy Scale: Poor                              Cognition Arousal/Alertness: Awake/alert Behavior During Therapy: Anxious Overall Cognitive Status: Impaired/Different from baseline Area of Impairment: Orientation, Attention, Memory, Following commands, Safety/judgement, Awareness, Problem solving  Orientation Level: Disoriented to, Time Current Attention Level:  Sustained Memory: Decreased recall of precautions, Decreased short-term memory Following Commands: Follows one step commands consistently Safety/Judgement: Decreased awareness of deficits Awareness: Intellectual Problem Solving: Difficulty sequencing, Requires verbal cues          Exercises      General Comments General comments (skin integrity, edema, etc.): VSS on RA      Pertinent Vitals/Pain Pain Assessment Pain Assessment: Faces Faces Pain Scale: Hurts even more Pain Location: back Pain Descriptors / Indicators: Aching Pain Intervention(s): Monitored during session    Home Living                          Prior Function            PT Goals (current goals can now be found in the care plan section) Acute Rehab PT Goals Patient Stated Goal: to stop falling Progress towards PT goals: Progressing toward goals    Frequency    Min 3X/week      PT Plan Current plan remains appropriate    Co-evaluation              AM-PAC PT "6 Clicks" Mobility   Outcome Measure  Help needed turning from your back to your side while in a flat bed without using bedrails?: A Little Help needed moving from lying on your back to sitting on the side of a flat bed without using bedrails?: A Little Help needed moving to and from a bed to a chair (including a wheelchair)?: A Little Help needed standing up from a chair using your arms (e.g., wheelchair or bedside chair)?: A Little Help needed to walk in hospital room?: A Little Help needed climbing 3-5 steps with a railing? : Total 6 Click Score: 16    End of Session Equipment Utilized During Treatment: Back brace Activity Tolerance: Patient tolerated treatment well Patient left: in chair;with call bell/phone within reach;with chair alarm set Nurse Communication: Mobility status PT Visit Diagnosis: Other abnormalities of gait and mobility (R26.89);History of falling (Z91.81)     Time: 9371-6967 PT Time  Calculation (min) (ACUTE ONLY): 31 min  Charges:  $Gait Training: 8-22 mins $Therapeutic Activity: 8-22 mins                     Zenaida Niece, PT, DPT Acute Rehabilitation Pager: [phone] Office [place] Serena Petterson 11/03/2021, 8:25 AM

## 2021-11-03 NOTE — Progress Notes (Signed)
  Mobility Specialist Criteria Algorithm Info.    11/03/21 1130  Pain Assessment  Pain Assessment 0-10  Pain Score 2  Pain Location back  Pain Descriptors / Indicators Aching  Pain Intervention(s) Monitored during session  Mobility  Activity Ambulated with assistance in hallway;Transferred from bed to chair (to chair after ambulation)  Range of Motion/Exercises Active;All extremities  Level of Assistance Contact guard assist, steadying assist (Min A sit>stand)  Assistive Device Front wheel walker  Distance Ambulated (ft) 110 ft  Activity Response Tolerated well   Patient received in supine eager and motivated to participate in mobility. Was independent to EOB from supine > sit. Required mod A to donn TSLO. Required min A to stand + cues for hand placement. Upon standing pt with flexed posture requiring frequent cues curing ambulation to stand/ambulate with upright erect posture. Ambulated in hallway min guard with slow steady gait. Needed x2 standing rest recovery, more so to correct posture. Returned to room without complaint or incident. Was left in recliner chair with all needs met, call bell in reach.   11/03/2021 1:35 PM  [place] Sabriyah Wilcher, [place], [place]  PHKFE:[phone] Office: [phone]

## 2021-11-03 NOTE — Progress Notes (Signed)
PROGRESS NOTE  Stephen Wilkinson  NIO:[id number] DOB: 1928-12-01 DOA: 10/28/2021 PCP: Biagio Borg, MD   Brief Narrative:  Patient is a 88 year old male with history of diabetes type 2, glaucoma, hypertension, hyperlipidemia, paroxysmal A-fib, hypothyroidism who presented with recurrent falls from home.  Report of falling frequently, significant fall few days ago before admission with a lumbar compression fracture.  Since then more falls occurred and he was also complaining of lower extremity weakness.  CT lumbar spine showed  acute compression fracture of the L3 vertebral body up to approximately 10% loss of vertebral body height centrally.neurosurgery consulted on admission, recommended TLSO.  IR consulted for kyphoplasty,plan for tomorrow  PT/OT recommending SNF on discharge.  Assessment & Plan:  Principal Problem:   Recurrent falls Active Problems:   Lumbar compression fracture, closed, initial encounter ([place])   Atrial fibrillation, chronic (HCC)   Hypothyroidism   Macular degeneration of both eyes   DM type 2 with diabetic peripheral neuropathy ([place])   Memory loss  Recurrent falls: Worsening.  Came from home.  Also complained of bilateral lower extremity weakness, gait disturbance.  Patient also has macular degeneration.  CT head did not show any acute intracranial findings.  PT/OT consulted and recommended skilled nursing facility on discharge.  Back pain/compression fracture:  CT lumbar spine showed  acute compression fracture of the L3 vertebral body up to approximately 10% loss of vertebral body height centrally.neurosurgery consulted on admission, recommended TLSO. IR consulted for  kyphoplasty.  Imagings also showed previous compression fracture, other degenerative changes.Continue pain medication, supportive care.  Permanent A-fib: Remains in A-fib but controlled rate on Toprol, amiodarone.  On Eliquis for anticoagulation now on hold for kyphoplasty plan.  Diabetes type 2: Recent  hemoglobin of 7.4.  Takes metformin at home.  Currently on sliding scale insulin  Hypertension: Continue Toprol  Hypothyroidism: Continue Synthyroid  AKI: Mild: Continue monitoring, started on gentle IV fluids.  On reviewing his previous lab works, his kidney function is normal at baseline.  Check BMP tomorrow  Glaucoma/macular degeneration: Might be contributing to the recurrent falls.  Receives Avastin injection as an outpatient.  Continue latanoprost  Confusion/memory loss: Suspected to have memory impairment.  Currently confused, delirious.  Most likely delirium.  Continue delirium precaution.  Started on low-dose Seroquel but now d/ced   Goals of care: Remains full code for now.      Nutrition Problem: Moderate Malnutrition Etiology: chronic illness    DVT prophylaxis:     Code Status: Full Code  Family Communication: Called  and discussed with wife on 5/22  Patient status: Inpatient  Patient is from : Home  Anticipated discharge to: Skilled nursing facility  Estimated DC date: After kyphoplasty   Consultants: IR  Procedures: None yet  Antimicrobials:  Anti-infectives (From admission, onward)    Start     Dose/Rate Route Frequency Ordered Stop   11/04/21 0600  ceFAZolin (ANCEF) IVPB 2g/100 mL premix        2 g 200 mL/hr over 30 Minutes Intravenous To Radiology 11/03/21 0840 11/05/21 0600       Subjective:  Patient seen and examined at the bedside this morning.  He looks comfortable today.  Sitting in the chair and eating his breakfast.  Complains of back pain though. Objective: Vitals:   11/02/21 1609 11/02/21 1932 11/02/21 2333 11/03/21 0355  BP: 102/70 113/74 107/90   Pulse:  81 80   Resp:  (!) '22 19 20  '$ Temp: 97.8 F (36.6 C) 98.1 F (36.7  C) (!) 97.4 F (36.3 C) (!) 97.4 F (36.3 C)  TempSrc: Oral Oral Axillary Oral  SpO2: 95% 95% 96%   Weight:      Height:        Intake/Output Summary (Last 24 hours) at 11/03/2021 1030 Last data filed  at 11/03/2021 0300 Gross per 24 hour  Intake 780 ml  Output 500 ml  Net 280 ml   Filed Weights   10/28/21 1147 10/28/21 2052  Weight: 81.6 kg 77 kg    Examination:  General exam: Overall comfortable, not in distress, pleasant elderly male HEENT: PERRL Respiratory system:  no wheezes or crackles  Cardiovascular system: Irregular irregular rhythm.  Gastrointestinal system: Abdomen is nondistended, soft and nontender. Central nervous system: Alert and awake, knows current month Extremities: No edema, no clubbing ,no cyanosis, TLSO brace Skin: No rashes, no ulcers,no icterus     Data Reviewed: I have personally reviewed following labs and imaging studies  CBC: Recent Labs  Lab 10/28/21 1226 10/30/21 0343 11/02/21 0226  WBC 9.6 8.6 8.0  NEUTROABS 7.8*  --   --   HGB 13.8 14.8 14.5  HCT 42.1 44.0 43.7  MCV 96.6 95.2 95.8  PLT 211 216 924   Basic Metabolic Panel: Recent Labs  Lab 10/28/21 1226 10/30/21 0343 11/02/21 0226 11/03/21 0141  NA 135 136 140 139  K 4.4 3.9 4.1 4.0  CL 102 102 106 109  CO2 '24 25 23 23  '$ GLUCOSE 176* 154* 120* 147*  BUN 19 23 32* 37*  CREATININE 1.42* 1.26* 1.60* 1.67*  CALCIUM 8.8* 8.9 9.0 8.5*     No results found for this or any previous visit (from the past 240 hour(s)).   Radiology Studies: No results found.  Scheduled Meds:  amiodarone  200 mg Oral Daily   docusate sodium  100 mg Oral BID   famotidine  20 mg Oral Daily   feeding supplement  237 mL Oral BID BM   insulin aspart  0-15 Units Subcutaneous TID WC   latanoprost  1 drop Both Eyes QHS   levothyroxine  125 mcg Oral Q0600   lidocaine  1-3 patch Transdermal Q24H   metoprolol succinate  25 mg Oral q morning   multivitamin with minerals  1 tablet Oral Daily   QUEtiapine  25 mg Oral QHS   sodium chloride flush  3 mL Intravenous Q12H   Continuous Infusions:  sodium chloride 75 mL/hr at 11/02/21 2157   [START ON 11/04/2021]  ceFAZolin (ANCEF) IV     methocarbamol  (ROBAXIN) IV 500 mg (10/29/21 1539)     LOS: 6 days   Shelly Coss, MD [facility] P5/22/2023, 10:30 AM

## 2021-11-03 NOTE — Progress Notes (Signed)
Speech Language Pathology Treatment: Cognitive-Linquistic  Patient Details Name: Stephen Wilkinson MRN: [id number] DOB: 04/05/1929 Today's Date: 11/03/2021 Time: 1335-1410 SLP Time Calculation (min) (ACUTE ONLY): 35 min  Assessment / Plan / Recommendation Clinical Impression  Patient seen by SLP for skilled treatment session focused on cognitive function goals. When SLP arrived into patient's room, he was awake and alert, sitting up in recliner. He was receptive to therapy session and feels that therapy in general is helpful to him. Throughout session patient would repeat comments he had made previously without awareness. He required verbal redirecting when he became tangential and not directly responding to questions. He had some delays in responses such as his birth year and current year. He participated in completing majority of the SLUMS examination however he declined to attempt 5 word recall (immediate recall after 2-3 rehearsals but did not attempt delayed recall of same words) or clock drawing ("I have macular degeneration and this would be impossible").  Overall, he scored 5 points out of possible 24 (when taking out clock drawing and shape identification). It was difficult to determine from talking to patient, exactly how long he has been having cognitive difficulties as he would speak of not being able to be a "role model" to his children and difficulty coping with no longer being head of household for his family. Patient appears to be in a depressed mood as he tells SLP how he was once a senior VP for [employer] and very good with numbers but now he can't do basic math and cannot understand/comprehend complex or even at times basic level information. SLP is recommending continued skilled treatment for cognitive function. Question benefit from psychological assessment due to appearance of depression.   HPI HPI: Pt is a 88 y.o. male who presented to [facility] hospital on 10/28/21 with recurrent falls and LE weakness.  CT head negative. Pt diagnosed 5/10 with a lumbar compression fx. PMH: DM; glaucoma, HTN, HLD, afib, and hypothyroidism.      SLP Plan  Continue with current plan of care      Recommendations for follow up therapy are one component of a multi-disciplinary discharge planning process, led by the attending physician.  Recommendations may be updated based on patient status, additional functional criteria and insurance authorization.    Recommendations                   Follow Up Recommendations: Skilled nursing-short term rehab (<3 hours/day) Assistance recommended at discharge: Frequent or constant Supervision/Assistance SLP Visit Diagnosis: Cognitive communication deficit (O87.867) Plan: Continue with current plan of care          Sonia Baller, MA, CCC-SLP Speech Therapy

## 2021-11-03 NOTE — TOC Progression Note (Incomplete)
Transition of Care [facility]) - Progression Note    Patient Details  Name: Stephen Wilkinson MRN: [id number] Date of Birth: 1929/06/02  Transition of [facility] CM/SW [place], RN Phone Number:[phone]  11/03/2021, 1:53 PM  Clinical Narrative:       Expected Discharge Plan: Skilled Nursing Facility Barriers to Discharge: Continued Medical Work up, SNF Pending bed offer  Expected Discharge Plan and Services Expected Discharge Plan: [place] In-house Referral: Clinical Social Work     Living arrangements for the past 2 months: Single Family Home                                       Social Determinants of Health (SDOH) Interventions    Readmission Risk Interventions     View : No data to display.

## 2021-11-04 ENCOUNTER — Inpatient Hospital Stay (HOSPITAL_COMMUNITY): Payer: Medicare Other

## 2021-11-04 ENCOUNTER — Telehealth: Payer: Self-pay | Admitting: Internal Medicine

## 2021-11-04 DIAGNOSIS — R296 Repeated falls: Secondary | ICD-10-CM | POA: Diagnosis not present

## 2021-11-04 DIAGNOSIS — E440 Moderate protein-calorie malnutrition: Secondary | ICD-10-CM | POA: Insufficient documentation

## 2021-11-04 HISTORY — PX: IR KYPHO LUMBAR INC FX REDUCE BONE BX UNI/BIL CANNULATION INC/IMAGING: IMG5519

## 2021-11-04 LAB — PROTIME-INR
INR: 1 (ref 0.8–1.2)
Prothrombin Time: 13.1 seconds (ref 11.4–15.2)

## 2021-11-04 LAB — BASIC METABOLIC PANEL
Anion gap: 5 (ref 5–15)
BUN: 23 mg/dL (ref 8–23)
CO2: 23 mmol/L (ref 22–32)
Calcium: 8.4 mg/dL — ABNORMAL LOW (ref 8.9–10.3)
Chloride: 108 mmol/L (ref 98–111)
Creatinine, Ser: 1.3 mg/dL — ABNORMAL HIGH (ref 0.61–1.24)
GFR, Estimated: 52 mL/min — ABNORMAL LOW (ref >60)
Glucose, Bld: 165 mg/dL — ABNORMAL HIGH (ref 70–99)
Potassium: 4.1 mmol/L (ref 3.5–5.1)
Sodium: 136 mmol/L (ref 135–145)

## 2021-11-04 LAB — CBC
HCT: 40.1 % (ref 39.0–52.0)
Hemoglobin: 13.2 g/dL (ref 13.0–17.0)
MCH: 31.6 pg (ref 26.0–34.0)
MCHC: 32.9 g/dL (ref 30.0–36.0)
MCV: 95.9 fL (ref 80.0–100.0)
Platelets: 256 10*3/uL (ref 150–400)
RBC: 4.18 MIL/uL — ABNORMAL LOW (ref 4.22–5.81)
RDW: 13 % (ref 11.5–15.5)
WBC: 9.5 10*3/uL (ref 4.0–10.5)
nRBC: 0 % (ref 0.0–0.2)

## 2021-11-04 LAB — GLUCOSE, CAPILLARY
Glucose-Capillary: 152 mg/dL — ABNORMAL HIGH (ref 70–99)
Glucose-Capillary: 170 mg/dL — ABNORMAL HIGH (ref 70–99)
Glucose-Capillary: 229 mg/dL — ABNORMAL HIGH (ref 70–99)
Glucose-Capillary: 160 mg/dL — ABNORMAL HIGH (ref 70–99)

## 2021-11-04 MED ORDER — FENTANYL CITRATE (PF) 100 MCG/2ML IJ SOLN
INTRAMUSCULAR | Status: AC
  Filled 2021-11-04: qty 4

## 2021-11-04 MED ORDER — SODIUM CHLORIDE 0.9 % IV SOLN: INTRAVENOUS | Status: DC

## 2021-11-04 MED ORDER — BUPIVACAINE HCL (PF) 0.5 % IJ SOLN
INTRAMUSCULAR | Status: AC
  Filled 2021-11-04: qty 30

## 2021-11-04 MED ORDER — QUETIAPINE 12.5 MG HALF TABLET
12.5000 mg | ORAL_TABLET | Freq: Every day | ORAL | Status: DC
  Filled 2021-11-04: qty 1

## 2021-11-04 MED ORDER — MELATONIN 3 MG PO TABS
3.0000 mg | ORAL_TABLET | Freq: Every day | ORAL | Status: DC
  Administered 2021-11-04: 3 mg via ORAL
  Filled 2021-11-04: qty 1

## 2021-11-04 MED ORDER — IOHEXOL 300 MG/ML  SOLN
100.0000 mL | Freq: Once | INTRAMUSCULAR | Status: AC | PRN
  Administered 2021-11-04: 10 mL

## 2021-11-04 MED ORDER — QUETIAPINE 12.5 MG HALF TABLET
12.5000 mg | ORAL_TABLET | Freq: Every day | ORAL | Status: DC
Start: 2021-11-04 — End: 2021-11-05
  Administered 2021-11-04: 12.5 mg via ORAL
  Filled 2021-11-04 (×2): qty 1

## 2021-11-04 MED ORDER — MIDAZOLAM HCL 2 MG/2ML IJ SOLN
INTRAMUSCULAR | Status: AC
  Filled 2021-11-04: qty 4

## 2021-11-04 MED ORDER — APIXABAN 2.5 MG PO TABS
2.5000 mg | ORAL_TABLET | Freq: Two times a day (BID) | ORAL | Status: DC
  Administered 2021-11-04 – 2021-11-05 (×3): 2.5 mg via ORAL
  Filled 2021-11-04 (×3): qty 1

## 2021-11-04 MED ORDER — CEFAZOLIN SODIUM-DEXTROSE 2-4 GM/100ML-% IV SOLN
INTRAVENOUS | Status: AC
  Administered 2021-11-04: 2 g via INTRAVENOUS
  Filled 2021-11-04: qty 100

## 2021-11-04 MED ORDER — MIDAZOLAM HCL 2 MG/2ML IJ SOLN
INTRAMUSCULAR | Status: AC | PRN
  Administered 2021-11-04: 1 mg via INTRAVENOUS
  Administered 2021-11-04 (×2): .5 mg via INTRAVENOUS

## 2021-11-04 MED ORDER — TOBRAMYCIN SULFATE 1.2 G IJ SOLR
INTRAMUSCULAR | Status: AC
  Filled 2021-11-04: qty 1.2

## 2021-11-04 MED ORDER — FENTANYL CITRATE (PF) 100 MCG/2ML IJ SOLN
INTRAMUSCULAR | Status: AC | PRN
  Administered 2021-11-04 (×3): 25 ug via INTRAVENOUS

## 2021-11-04 NOTE — Progress Notes (Signed)
Occupational Therapy Treatment Patient Details Name: Stephen Wilkinson MRN: [id number] DOB: February 02, 1929 Today's Date: 11/04/2021   History of present illness 88 y.o. male presents to [facility] hospital on 10/28/2021 with recurrent falls and LE weakness. Pt diagnosed 5/10 with a lumbar compression fx. PMH includes DM; glaucoma; HTN; HLD; afib; and hypothyroidism.   OT comments  Less required more physical and cognitive assist this date compared to evaluation on 5/17. He was confused throughout but pleasantly cooperative. Pt was perseverating on the need to contact his wife and difficult to re-direct, attempted to call wife during session, no answer. Pt followed most 1 step commands with increased cues for attention and sequencing. Overall he needed mod A for bed mobility, sit<>stands and functional mobility with RW. Pt will continue to benefit from OT acutely. D/c remains appropriate.    Recommendations for follow up therapy are one component of a multi-disciplinary discharge planning process, led by the attending physician.  Recommendations may be updated based on patient status, additional functional criteria and insurance authorization.    Follow Up Recommendations  Skilled nursing-short term rehab (<3 hours/day)    Assistance Recommended at Discharge Frequent or constant Supervision/Assistance  Patient can return home with the following  A lot of help with walking and/or transfers;A lot of help with bathing/dressing/bathroom;Direct supervision/assist for medications management;Assist for transportation;Help with stairs or ramp for entrance   Equipment Recommendations  Tub/shower seat    Recommendations for Other Services      Precautions / Restrictions Precautions Precautions: Fall;Back Precaution Booklet Issued: Yes (comment) Precaution Comments: compression fx, TLSO at all times other than when showering Required Braces or Orthoses: Spinal Brace Spinal Brace: Thoracolumbosacral orthotic;Applied in  sitting position Restrictions Weight Bearing Restrictions: No       Mobility Bed Mobility Overal bed mobility: Needs Assistance Bed Mobility: Rolling, Sidelying to Sit, Sit to Sidelying Rolling: Min assist Sidelying to sit: Mod assist     Sit to sidelying: Mod assist General bed mobility comments: needs cues for sequencing    Transfers Overall transfer level: Needs assistance Equipment used: Rolling walker (2 wheels) Transfers: Sit to/from Stand Sit to Stand: Mod assist           General transfer comment: cues for all sequencing and increased assist for balance and safety     Balance Overall balance assessment: Needs assistance Sitting-balance support: No upper extremity supported, Feet supported Sitting balance-Leahy Scale: Fair     Standing balance support: Bilateral upper extremity supported, Reliant on assistive device for balance Standing balance-Leahy Scale: Poor                             ADL either performed or assessed with clinical judgement   ADL Overall ADL's : Needs assistance/impaired                         Toilet Transfer: Moderate assistance;BSC/3in1;Rolling walker (2 wheels)           Functional mobility during ADLs: Moderate assistance General ADL Comments: increased assist needed for cognition, balance and saety this session    Extremity/Trunk Assessment Upper Extremity Assessment Upper Extremity Assessment: Generalized weakness   Lower Extremity Assessment Lower Extremity Assessment: Defer to PT evaluation        Vision   Vision Assessment?: No apparent visual deficits;Vision impaired- to be further tested in functional context   Perception Perception Perception: Not tested   Praxis Praxis Praxis: Not  tested    Cognition Arousal/Alertness: Awake/alert Behavior During Therapy: Anxious Overall Cognitive Status: Impaired/Different from baseline Area of Impairment: Orientation, Attention, Memory,  Following commands, Safety/judgement, Awareness, Problem solving                 Orientation Level: Disoriented to, Time Current Attention Level: Sustained Memory: Decreased recall of precautions, Decreased short-term memory Following Commands: Follows one step commands consistently Safety/Judgement: Decreased awareness of deficits Awareness: Intellectual Problem Solving: Difficulty sequencing, Requires verbal cues General Comments: confused throughout but pleasant. perseverating on not being able to get in touch with his wife. difficult to redirect. follows most one step commands        Exercises      Shoulder Instructions       General Comments VSS on RA - attempted to call wife, no answer    Pertinent Vitals/ Pain       Pain Assessment Pain Assessment: Faces Faces Pain Scale: Hurts a little bit Pain Location: back Pain Descriptors / Indicators: Grimacing Pain Intervention(s): Limited activity within patient's tolerance, Monitored during session  Home Living                                          Prior Functioning/Environment              Frequency  Min 2X/week        Progress Toward Goals  OT Goals(current goals can now be found in the care plan section)  Progress towards OT goals: Progressing toward goals  Acute Rehab OT Goals Patient Stated Goal: talk to wife OT Goal Formulation: With patient Time For Goal Achievement: 11/12/21 Potential to Achieve Goals: Good ADL Goals Pt Will Perform Lower Body Bathing: with modified independence;sit to/from stand Pt Will Perform Lower Body Dressing: with supervision;sit to/from stand Pt Will Transfer to Toilet: with supervision;ambulating Additional ADL Goal #1: Pt will indep recall and adhere to back precautions 100% of the session  Plan Discharge plan remains appropriate    Co-evaluation                 AM-PAC OT "6 Clicks" Daily Activity     Outcome Measure   Help from  another person eating meals?: None Help from another person taking care of personal grooming?: A Little Help from another person toileting, which includes using toliet, bedpan, or urinal?: A Lot Help from another person bathing (including washing, rinsing, drying)?: A Lot Help from another person to put on and taking off regular upper body clothing?: A Little Help from another person to put on and taking off regular lower body clothing?: A Lot 6 Click Score: 16    End of Session Equipment Utilized During Treatment: Rolling walker (2 wheels);Back brace  OT Visit Diagnosis: Other abnormalities of gait and mobility (R26.89);Unsteadiness on feet (R26.81);Muscle weakness (generalized) (M62.81);Pain   Activity Tolerance Patient tolerated treatment well   Patient Left in bed;with call bell/phone within reach;with bed alarm set;with nursing/sitter in room   Nurse Communication Mobility status        Time: 1430-1503 OT Time Calculation (min): 33 min  Charges: OT General Charges $OT Visit: 1 Visit OT Treatments $Therapeutic Activity: 23-37 mins    Safaa Stingley A Liya Strollo 11/04/2021, 4:02 PM

## 2021-11-04 NOTE — Procedures (Signed)
INR. Status post L3 balloon kyphoplasty via bipedicular approach.  Arlean Hopping MD.

## 2021-11-04 NOTE — Progress Notes (Signed)
OT Cancellation Note  Patient Details Name: Zeth Buday MRN: [id number] DOB: February 08, 1929   Cancelled Treatment:    Reason Eval/Treat Not Completed: Patient at procedure or test/ unavailable (Pt off of the unit upon arrival, OT treatment to return as able)  Aldona Bar A Shaeley Segall 11/04/2021, 9:24 AM

## 2021-11-04 NOTE — Discharge Instructions (Signed)
1.  No stooping, bending or lifting weights above 10 pounds for 2 weeks.  2.  Use a walker to ambulate for 2 weeks.  3.   No driving follow-up 2 weeks.  4.  RTC as needed in 2 weeks.

## 2021-11-04 NOTE — Progress Notes (Signed)
Pt much more redirectable after IV Haldol administration.  Pt was able to understand that we are not in his home trying to harm him or rob him.  Pt was calm and allowed RN to reapply telemetry leads, oxygen sensor, and covers and warm blankets.  Pt does not appear frightened anymore or combative.  Pt talking and expressing his concerns.  RN has tried to reorient him to the fact that we are in the hospital and that he is in a safe place.  Patient is resting comfortably at this time.

## 2021-11-04 NOTE — Telephone Encounter (Signed)
Pt spouse called in states pt is in hospital, and they want to d/c him to a nursing home- but pt is "not the same person, his personality has changed."   Now, pt is cursing, very disoriented.   Please call [phone] ASAP.

## 2021-11-04 NOTE — Progress Notes (Signed)
PROGRESS NOTE  Stephen Wilkinson  QIW:[id number] DOB: Oct 16, 1928 DOA: 10/28/2021 PCP: Biagio Borg, MD   Brief Narrative:  Patient is a 88 year old male with history of diabetes type 2, glaucoma, hypertension, hyperlipidemia, paroxysmal A-fib, hypothyroidism who presented with recurrent falls from home.  Report of falling frequently, significant fall few days ago before admission with a lumbar compression fracture.  Since then more falls occurred and he was also complaining of lower extremity weakness.  CT lumbar spine showed  acute compression fracture of the L3 vertebral body up to approximately 10% loss of vertebral body height centrally.neurosurgery consulted on admission, recommended TLSO.  IR consulted for kyphoplasty, underwent kyphoplasty.  PT/OT recommending SNF on discharge.  Medically stable for discharge to SNF whenever possible  Assessment & Plan:  Principal Problem:   Recurrent falls Active Problems:   Lumbar compression fracture, closed, initial encounter ([place])   Atrial fibrillation, chronic ([place])   Hypothyroidism   Macular degeneration of both eyes   DM type 2 with diabetic peripheral neuropathy ([place])   Memory loss   Malnutrition of moderate degree  Recurrent falls: Worsening.  Came from home.  Also complained of bilateral lower extremity weakness, gait disturbance.  Patient also has macular degeneration.  CT head did not show any acute intracranial findings.  PT/OT consulted and recommended skilled nursing facility on discharge.  Back pain/compression fracture:  CT lumbar spine showed  acute compression fracture of the L3 vertebral body up to approximately 10% loss of vertebral body height centrally.neurosurgery consulted on admission, recommended TLSO. IR consulted for  kyphoplasty.  Status post kyphoplasty on 5/23.  Imagings also showed previous compression fracture, other degenerative changes.Continue pain medication, supportive care.  Permanent A-fib: Remains in A-fib but  controlled rate on Toprol, amiodarone.  On Eliquis for anticoagulation .  Diabetes type 2: Recent hemoglobin of 7.4.  Takes metformin at home.  Currently on sliding scale insulin  Hypertension: Continue Toprol  Hypothyroidism: Continue Synthyroid  AKI: Mild: Continue monitoring, started on gentle IV fluids.  On reviewing his previous lab works, his kidney function is normal at baseline.  Improved  Glaucoma/macular degeneration: Might be contributing to the recurrent falls.  Receives Avastin injection as an outpatient.  Continue latanoprost  Confusion/memory loss: Suspected to have memory impairment.  Currently confused, delirious.  Most likely delirium.  Continue delirium precaution.  Started on low-dose Seroquel but now d/ced   Goals of care: Remains full code for now.      Nutrition Problem: Moderate Malnutrition Etiology: chronic illness    DVT prophylaxis:     Code Status: Full Code  Family Communication: Called  and discussed with wife on 5/22  Patient status: Inpatient  Patient is from : Home  Anticipated discharge to: Skilled nursing facility  Estimated DC date: Whenever possible  Consultants: IR  Procedures: None yet  Antimicrobials:  Anti-infectives (From admission, onward)    Start     Dose/Rate Route Frequency Ordered Stop   11/04/21 0855  tobramycin (NEBCIN) 1.2 g powder       Note to Pharmacy: [place], [place]: cabinet override      11/04/21 0855 11/04/21 2059   11/04/21 0600  ceFAZolin (ANCEF) IVPB 2g/100 mL premix        2 g 200 mL/hr over 30 Minutes Intravenous To Radiology 11/03/21 0840 11/04/21 0943       Subjective:  Patient seen and examined at the bedside this morning after kyphoplasty.  Remains comfortable, hemodynamically stable.  Back pain well controlled.  Objective: Vitals:  11/04/21 0955 11/04/21 1000 11/04/21 1026 11/04/21 1114  BP: 119/86 116/78 134/88 (!) 144/93  Pulse: 84 80 77 74  Resp: '10 12 16 19  '$ Temp:    98.1 F  (36.7 C)  TempSrc:    Oral  SpO2: 98% 96% 94% 97%  Weight:      Height:        Intake/Output Summary (Last 24 hours) at 11/04/2021 1157 Last data filed at 11/04/2021 1028 Gross per 24 hour  Intake 2058.54 ml  Output 1750 ml  Net 308.54 ml   Filed Weights   10/28/21 1147 10/28/21 2052  Weight: 81.6 kg 77 kg    Examination:  General exam: Overall comfortable, not in distress HEENT: PERRL Respiratory system:  no wheezes or crackles  Cardiovascular system: Irregularly irregular rhythm. Gastrointestinal system: Abdomen is nondistended, soft and nontender. Central nervous system: Alert and oriented Extremities: No edema, no clubbing ,no cyanosis Skin: No rashes, no ulcers,no icterus     Data Reviewed: I have personally reviewed following labs and imaging studies  CBC: Recent Labs  Lab 10/28/21 1226 10/30/21 0343 11/02/21 0226 11/04/21 0344  WBC 9.6 8.6 8.0 9.5  NEUTROABS 7.8*  --   --   --   HGB 13.8 14.8 14.5 13.2  HCT 42.1 44.0 43.7 40.1  MCV 96.6 95.2 95.8 95.9  PLT 211 216 274 379   Basic Metabolic Panel: Recent Labs  Lab 10/28/21 1226 10/30/21 0343 11/02/21 0226 11/03/21 0141 11/04/21 0344  NA 135 136 140 139 136  K 4.4 3.9 4.1 4.0 4.1  CL 102 102 106 109 108  CO2 '24 25 23 23 23  '$ GLUCOSE 176* 154* 120* 147* 165*  BUN 19 23 32* 37* 23  CREATININE 1.42* 1.26* 1.60* 1.67* 1.30*  CALCIUM 8.8* 8.9 9.0 8.5* 8.4*     No results found for this or any previous visit (from the past 240 hour(s)).   Radiology Studies: No results found.  Scheduled Meds:  amiodarone  200 mg Oral Daily   bupivacaine(PF)       docusate sodium  100 mg Oral BID   famotidine  20 mg Oral Daily   feeding supplement  237 mL Oral BID BM   insulin aspart  0-15 Units Subcutaneous TID WC   latanoprost  1 drop Both Eyes QHS   levothyroxine  125 mcg Oral Q0600   lidocaine  1-3 patch Transdermal Q24H   metoprolol succinate  25 mg Oral q morning   multivitamin with minerals  1 tablet  Oral Daily   sodium chloride flush  3 mL Intravenous Q12H   tobramycin       Continuous Infusions:  sodium chloride 75 mL/hr at 11/04/21 1028   sodium chloride 50 mL/hr at 11/04/21 1135   methocarbamol (ROBAXIN) IV 500 mg (10/29/21 1539)     LOS: 7 days   Shelly Coss, MD [facility] P5/23/2023, 11:57 AM

## 2021-11-04 NOTE — TOC Progression Note (Signed)
Transition of Care [facility]) - Progression Note    Patient Details  Name: Aemon Koeller MRN: [id number] Date of Birth: 14-Mar-1929  Transition of Care [facility]) CM/SW [place], [place] Phone Number: 11/04/2021, 12:16 PM  Clinical Narrative:     CSW followed up with pt's spouse to discuss DC plans. CSW noting that pt is only oriented x1. Spouse notes she would like [facility] able to accept tomorrow. Spouse has several concerns including pt's confusion, Physical Therapy, Nursing, and discharge. These were passed on to the medical team. TOC will continue to follow for DC needs.  Expected Discharge Plan: [place] Barriers to Discharge: Continued Medical Work up, SNF Pending bed offer  Expected Discharge Plan and Services Expected Discharge Plan: [place] In-house Referral: Clinical Social Work     Living arrangements for the past 2 months: Single Family Home                                       Social Determinants of Health (SDOH) Interventions    Readmission Risk Interventions     View : No data to display.

## 2021-11-05 DIAGNOSIS — I152 Hypertension secondary to endocrine disorders: Secondary | ICD-10-CM | POA: Diagnosis not present

## 2021-11-05 DIAGNOSIS — F5101 Primary insomnia: Secondary | ICD-10-CM | POA: Diagnosis not present

## 2021-11-05 DIAGNOSIS — M6281 Muscle weakness (generalized): Secondary | ICD-10-CM | POA: Diagnosis not present

## 2021-11-05 DIAGNOSIS — I4819 Other persistent atrial fibrillation: Secondary | ICD-10-CM | POA: Diagnosis not present

## 2021-11-05 DIAGNOSIS — N189 Chronic kidney disease, unspecified: Secondary | ICD-10-CM | POA: Diagnosis not present

## 2021-11-05 DIAGNOSIS — R2689 Other abnormalities of gait and mobility: Secondary | ICD-10-CM | POA: Diagnosis not present

## 2021-11-05 DIAGNOSIS — I4891 Unspecified atrial fibrillation: Secondary | ICD-10-CM | POA: Diagnosis not present

## 2021-11-05 DIAGNOSIS — R1312 Dysphagia, oropharyngeal phase: Secondary | ICD-10-CM | POA: Diagnosis not present

## 2021-11-05 DIAGNOSIS — R41841 Cognitive communication deficit: Secondary | ICD-10-CM | POA: Diagnosis not present

## 2021-11-05 DIAGNOSIS — H409 Unspecified glaucoma: Secondary | ICD-10-CM | POA: Diagnosis not present

## 2021-11-05 DIAGNOSIS — Z9181 History of falling: Secondary | ICD-10-CM | POA: Diagnosis not present

## 2021-11-05 DIAGNOSIS — E1142 Type 2 diabetes mellitus with diabetic polyneuropathy: Secondary | ICD-10-CM | POA: Diagnosis not present

## 2021-11-05 DIAGNOSIS — H353211 Exudative age-related macular degeneration, right eye, with active choroidal neovascularization: Secondary | ICD-10-CM | POA: Diagnosis not present

## 2021-11-05 DIAGNOSIS — I350 Nonrheumatic aortic (valve) stenosis: Secondary | ICD-10-CM | POA: Diagnosis not present

## 2021-11-05 DIAGNOSIS — F0390 Unspecified dementia without behavioral disturbance: Secondary | ICD-10-CM | POA: Diagnosis not present

## 2021-11-05 DIAGNOSIS — I10 Essential (primary) hypertension: Secondary | ICD-10-CM | POA: Diagnosis not present

## 2021-11-05 DIAGNOSIS — E1159 Type 2 diabetes mellitus with other circulatory complications: Secondary | ICD-10-CM | POA: Diagnosis not present

## 2021-11-05 DIAGNOSIS — M48062 Spinal stenosis, lumbar region with neurogenic claudication: Secondary | ICD-10-CM | POA: Diagnosis not present

## 2021-11-05 DIAGNOSIS — Z7401 Bed confinement status: Secondary | ICD-10-CM | POA: Diagnosis not present

## 2021-11-05 DIAGNOSIS — E7800 Pure hypercholesterolemia, unspecified: Secondary | ICD-10-CM | POA: Diagnosis not present

## 2021-11-05 DIAGNOSIS — H353124 Nonexudative age-related macular degeneration, left eye, advanced atrophic with subfoveal involvement: Secondary | ICD-10-CM | POA: Diagnosis not present

## 2021-11-05 DIAGNOSIS — F32A Depression, unspecified: Secondary | ICD-10-CM | POA: Diagnosis not present

## 2021-11-05 DIAGNOSIS — E039 Hypothyroidism, unspecified: Secondary | ICD-10-CM | POA: Diagnosis not present

## 2021-11-05 DIAGNOSIS — E440 Moderate protein-calorie malnutrition: Secondary | ICD-10-CM | POA: Diagnosis not present

## 2021-11-05 DIAGNOSIS — R410 Disorientation, unspecified: Secondary | ICD-10-CM | POA: Diagnosis not present

## 2021-11-05 DIAGNOSIS — R4189 Other symptoms and signs involving cognitive functions and awareness: Secondary | ICD-10-CM | POA: Diagnosis not present

## 2021-11-05 DIAGNOSIS — S32000A Wedge compression fracture of unspecified lumbar vertebra, initial encounter for closed fracture: Secondary | ICD-10-CM | POA: Diagnosis not present

## 2021-11-05 DIAGNOSIS — H353113 Nonexudative age-related macular degeneration, right eye, advanced atrophic without subfoveal involvement: Secondary | ICD-10-CM | POA: Diagnosis not present

## 2021-11-05 DIAGNOSIS — R262 Difficulty in walking, not elsewhere classified: Secondary | ICD-10-CM | POA: Diagnosis not present

## 2021-11-05 DIAGNOSIS — S32000S Wedge compression fracture of unspecified lumbar vertebra, sequela: Secondary | ICD-10-CM | POA: Diagnosis not present

## 2021-11-05 DIAGNOSIS — R296 Repeated falls: Secondary | ICD-10-CM | POA: Diagnosis not present

## 2021-11-05 DIAGNOSIS — S32030D Wedge compression fracture of third lumbar vertebra, subsequent encounter for fracture with routine healing: Secondary | ICD-10-CM | POA: Diagnosis not present

## 2021-11-05 DIAGNOSIS — R2681 Unsteadiness on feet: Secondary | ICD-10-CM | POA: Diagnosis not present

## 2021-11-05 DIAGNOSIS — I482 Chronic atrial fibrillation, unspecified: Secondary | ICD-10-CM | POA: Diagnosis not present

## 2021-11-05 DIAGNOSIS — Z7189 Other specified counseling: Secondary | ICD-10-CM | POA: Diagnosis not present

## 2021-11-05 DIAGNOSIS — S32000D Wedge compression fracture of unspecified lumbar vertebra, subsequent encounter for fracture with routine healing: Secondary | ICD-10-CM | POA: Diagnosis not present

## 2021-11-05 DIAGNOSIS — F05 Delirium due to known physiological condition: Secondary | ICD-10-CM | POA: Diagnosis not present

## 2021-11-05 DIAGNOSIS — H3530 Unspecified macular degeneration: Secondary | ICD-10-CM | POA: Diagnosis not present

## 2021-11-05 DIAGNOSIS — Z8673 Personal history of transient ischemic attack (TIA), and cerebral infarction without residual deficits: Secondary | ICD-10-CM | POA: Diagnosis not present

## 2021-11-05 LAB — BASIC METABOLIC PANEL
Anion gap: 5 (ref 5–15)
BUN: 18 mg/dL (ref 8–23)
CO2: 27 mmol/L (ref 22–32)
Calcium: 9.1 mg/dL (ref 8.9–10.3)
Chloride: 107 mmol/L (ref 98–111)
Creatinine, Ser: 1.29 mg/dL — ABNORMAL HIGH (ref 0.61–1.24)
GFR, Estimated: 52 mL/min — ABNORMAL LOW (ref >60)
Glucose, Bld: 158 mg/dL — ABNORMAL HIGH (ref 70–99)
Potassium: 4.3 mmol/L (ref 3.5–5.1)
Sodium: 139 mmol/L (ref 135–145)

## 2021-11-05 LAB — GLUCOSE, CAPILLARY
Glucose-Capillary: 199 mg/dL — ABNORMAL HIGH (ref 70–99)
Glucose-Capillary: 188 mg/dL — ABNORMAL HIGH (ref 70–99)

## 2021-11-05 LAB — AMMONIA: Ammonia: 12 umol/L (ref 9–35)

## 2021-11-05 MED ORDER — METHOCARBAMOL 500 MG PO TABS: 500.0000 mg | ORAL_TABLET | Freq: Four times a day (QID) | ORAL | Status: DC | PRN

## 2021-11-05 MED ORDER — APIXABAN 2.5 MG PO TABS: 2.5000 mg | ORAL_TABLET | Freq: Two times a day (BID) | ORAL | Status: DC

## 2021-11-05 MED ORDER — LIDOCAINE 5 % EX PTCH: 1.0000 | MEDICATED_PATCH | CUTANEOUS | 0 refills | Status: DC

## 2021-11-05 MED ORDER — QUETIAPINE FUMARATE 25 MG PO TABS: 12.5000 mg | ORAL_TABLET | Freq: Every day | ORAL | Status: DC

## 2021-11-05 MED ORDER — POLYETHYLENE GLYCOL 3350 17 G PO PACK
17.0000 g | PACK | Freq: Every day | ORAL | 0 refills | Status: DC
Start: 2021-11-05 — End: 2022-01-12

## 2021-11-05 MED ORDER — MELATONIN 3 MG PO TABS: 3.0000 mg | ORAL_TABLET | Freq: Every day | ORAL | 0 refills | Status: DC

## 2021-11-05 MED ORDER — OXYCODONE HCL 5 MG PO TABS
5.0000 mg | ORAL_TABLET | Freq: Four times a day (QID) | ORAL | Status: DC | PRN
  Administered 2021-11-05: 5 mg via ORAL
  Filled 2021-11-05: qty 1

## 2021-11-05 MED ORDER — POLYETHYLENE GLYCOL 3350 17 G PO PACK
17.0000 g | PACK | Freq: Every day | ORAL | Status: DC
  Administered 2021-11-05: 17 g via ORAL
  Filled 2021-11-05: qty 1

## 2021-11-05 MED ORDER — OXYCODONE HCL 5 MG PO TABS: 5.0000 mg | ORAL_TABLET | Freq: Four times a day (QID) | ORAL | 0 refills | Status: DC | PRN

## 2021-11-05 NOTE — Consult Note (Signed)
[facility] Inpatient Consult   11/05/2021  Aum Caggiano March 22, 1929 [id number]  [place] [facility] CM)   Patient chart has been reviewed with noted medium risk score for unplanned readmissions.  Patient assessed for community [place] Management follow up needs. Per review, current disposition is for SNF. No [facility] CM needs.   Of note, [facility] Care Management services does not replace or interfere with any services that are arranged by inpatient case management or social work.    Netta Cedars, MSN, RN [facility] Liaison Toll free office [phone]

## 2021-11-05 NOTE — Discharge Summary (Signed)
Physician Discharge Summary  Stephen Wilkinson KGM:[id number] DOB: 1929/03/02 DOA: 10/28/2021  PCP: Biagio Borg, MD  Admit date: 10/28/2021 Discharge date: 11/05/2021  Admitted From: Home Disposition:  Home  Discharge Condition:Stable CODE STATUS:FULL Diet recommendation: Heart Healthy   Brief/Interim Summary:  Patient is a 88 year old male with history of diabetes type 2, glaucoma, hypertension, hyperlipidemia, paroxysmal A-fib, hypothyroidism who presented with recurrent falls from home.  Report of falling frequently, significant fall few days ago before admission with a lumbar compression fracture.  Since then more falls occurred and he was also complaining of lower extremity weakness.  CT lumbar spine showed  acute compression fracture of the L3 vertebral body up to approximately 10% loss of vertebral body height centrally.neurosurgery consulted on admission, recommended TLSO.  IR consulted for kyphoplasty, underwent kyphoplasty.  PT/OT recommending SNF on discharge.  Medically stable for discharge to SNF .  Following problems were addressed during his hospitalization:    Recurrent falls: Worsening.  Came from home.  Also complained of bilateral lower extremity weakness, gait disturbance.  Patient also has macular degeneration.  CT head did not show any acute intracranial findings.  PT/OT consulted and recommended skilled nursing facility on discharge.   Back pain/compression fracture:  CT lumbar spine showed  acute compression fracture of the L3 vertebral body up to approximately 10% loss of vertebral body height centrally.neurosurgery consulted on admission, recommended TLSO. IR consulted for  kyphoplasty.  Status post kyphoplasty on 5/23.  Imagings also showed previous compression fracture, other degenerative changes.Continue pain medication, supportive care.   Permanent A-fib: Remains in A-fib but controlled rate on Toprol, amiodarone.  On Eliquis for anticoagulation .  Dose decreased to 2.5  mg twice daily   Diabetes type 2: Recent hemoglobin of 7.4.  Takes metformin at home.    Hypertension: Continue Toprol   Hypothyroidism: Continue Synthyroid   AKI: Mild: Check BMP in a week   Glaucoma/macular degeneration: Might be contributing to the recurrent falls.  Receives Avastin injection as an outpatient.  Continue latanoprost   Confusion/memory loss: Intermittently confused.  Most likely delirium.   Started on low-dose seroquel .  Continue melatonin.  MRI did not show any acute intracranial abnormalities.  Minimize narcotics, sedatives   goals of care: Remains full code for now.    Discharge Diagnoses:  Principal Problem:   Recurrent falls Active Problems:   Lumbar compression fracture, closed, initial encounter ([place])   Atrial fibrillation, chronic ([place])   Hypothyroidism   Macular degeneration of both eyes   DM type 2 with diabetic peripheral neuropathy ([place])   Memory loss   Malnutrition of moderate degree    Discharge Instructions  Discharge Instructions     Diet - low sodium heart healthy   Complete by: As directed    Discharge instructions   Complete by: As directed    1)Please take prescribed medications as instructed 2) Do a BMP test in a week   Increase activity slowly   Complete by: As directed       Allergies as of 11/05/2021   No Known Allergies      Medication List     TAKE these medications    amiodarone 200 MG tablet Commonly known as: PACERONE Take 1 tablet (200 mg total) by mouth daily.   apixaban 2.5 MG Tabs tablet Commonly known as: ELIQUIS Take 1 tablet (2.5 mg total) by mouth 2 (two) times daily. What changed:  medication strength how much to take   AVASTIN IV Place 1 Syringe  into the right eye See admin instructions. 1 dose injected in the right eye every 7 weeks   famotidine 20 MG tablet Commonly known as: PEPCID Take 1 tablet (20 mg total) by mouth 2 (two) times daily. What changed: when to take this   glucose  blood test strip Commonly known as: ONE TOUCH ULTRA TEST Use as directed once daily to check blood sugar.  Diagnosis code E11.42   ICAPS AREDS 2 PO Take 2 capsules by mouth daily.   latanoprost 0.005 % ophthalmic solution Commonly known as: XALATAN Place 1 drop into both eyes at bedtime.   levothyroxine 125 MCG tablet Commonly known as: SYNTHROID TAKE 1 TABLET EVERY DAY BEFORE BREAKFAST What changed: See the new instructions.   lidocaine 5 % Commonly known as: LIDODERM Place 1-3 patches onto the skin daily. Remove & Discard patch within 12 hours or as directed by MD   melatonin 3 MG Tabs tablet Take 1 tablet (3 mg total) by mouth at bedtime.   metFORMIN 1000 MG tablet Commonly known as: GLUCOPHAGE TAKE 1 TABLET EVERY DAY What changed: when to take this   methocarbamol 500 MG tablet Commonly known as: ROBAXIN Take 1 tablet (500 mg total) by mouth every 6 (six) hours as needed for muscle spasms.   metoprolol succinate 25 MG 24 hr tablet Commonly known as: TOPROL-XL Take 1 tablet (25 mg total) by mouth every morning.   oxyCODONE 5 MG immediate release tablet Commonly known as: Oxy IR/ROXICODONE Take 1 tablet (5 mg total) by mouth every 6 (six) hours as needed for moderate pain.   polyethylene glycol 17 g packet Commonly known as: MIRALAX / GLYCOLAX Take 17 g by mouth daily.   QUEtiapine 25 MG tablet Commonly known as: SEROQUEL Take 0.5 tablets (12.5 mg total) by mouth at bedtime.        No Known Allergies  Consultations: IR   Procedures/Studies: CT Head Wo Contrast  Result Date: 10/28/2021 CLINICAL DATA:  Trauma slid off couch EXAM: CT HEAD WITHOUT CONTRAST TECHNIQUE: Contiguous axial images were obtained from the base of the skull through the vertex without intravenous contrast. RADIATION DOSE REDUCTION: This exam was performed according to the departmental dose-optimization program which includes automated exposure control, adjustment of the mA and/or kV  according to patient size and/or use of iterative reconstruction technique. COMPARISON:  06/08/2014 correlation is also made with MRI head 11/02/2018 FINDINGS: Brain: No evidence of acute infarction, hemorrhage, cerebral edema, mass, mass effect, or midline shift. No hydrocephalus or extra-axial fluid collection. Generalized cerebral and cerebellar atrophy, with prominent ventricles. Periventricular white matter changes, likely the sequela of chronic small vessel ischemic disease. Vascular: No hyperdense vessel. Skull: Normal. Negative for fracture or focal lesion. Sinuses/Orbits: Mucosal thickening in the left-greater-than-right maxillary sinus. Status post bilateral lens replacements. Other: The mastoid air cells are well aerated. IMPRESSION: No acute intracranial process. Electronically Signed   By: Merilyn Baba M.D.   On: 10/28/2021 12:32   CT Lumbar Spine Wo Contrast  Result Date: 10/28/2021 CLINICAL DATA:  Fall, lower back pain EXAM: CT LUMBAR SPINE WITHOUT CONTRAST TECHNIQUE: Multidetector CT imaging of the lumbar spine was performed without intravenous contrast administration. Multiplanar CT image reconstructions were also generated. RADIATION DOSE REDUCTION: This exam was performed according to the departmental dose-optimization program which includes automated exposure control, adjustment of the mA and/or kV according to patient size and/or use of iterative reconstruction technique. COMPARISON:  Lumbar spine MRI 11/28/2019 FINDINGS: Segmentation: Standard; the lowest formed disc space is  designated L5-S1. Alignment: There is trace retrolisthesis of L2 on L3 and grade 1 anterolisthesis of L5 on S1, similar to the prior MRI. Alignment is otherwise normal. There is no evidence of traumatic malalignment. Vertebrae: Compression deformity of the L2 vertebral body with up to approximately 50% loss of vertebral body height is similar in extent compared to the MRI from 2021, with interval vertebral  augmentation. There is an acute compression fracture of the L3 vertebral body with fracture planes extending from the anterior endplate to the superior endplate as well as into posterior endplate and bilateral pedicles (12-52, 12-32). There is no bony retropulsion or definite epidural hematoma. There is up to approximately 10% loss of vertebral body height centrally. The other vertebral body heights are preserved. There are flowing osteophytes throughout the lower thoracic spine. The bones are diffusely demineralized. Paraspinal and other soft tissues: Coronary artery and aortic valve calcifications are noted. There is calcified atherosclerotic plaque throughout the imaged aorta. There is fatty atrophy of the pancreas. Paraspinal soft tissues are unremarkable. Disc levels: T12-L1: No significant spinal canal or neural foraminal stenosis L1-L2: There is a mild disc bulge and mild bilateral facet arthropathy without significant spinal canal or neural foraminal stenosis L2-L3: There is a disc bulge eccentric to the left, degenerative endplate change, and bilateral facet arthropathy resulting in moderate to severe left and moderate right neural foraminal stenosis and at least moderate spinal canal stenosis L3-L4: Status post posterior decompression. There is a diffuse disc bulge, degenerative endplate change, and bilateral facet arthropathy resulting in severe bilateral neural foraminal stenosis and likely moderate spinal canal stenosis L4-L5: Status post posterior decompression. There is a mild disc bulge, degenerative endplate change, and bilateral facet arthropathy resulting in moderate bilateral neural foraminal stenosis without evidence of significant spinal canal stenosis L5-S1: Status post posterior decompression. There is grade 1 anterolisthesis with a mild disc bulge and bilateral facet arthropathy resulting in moderate to severe bilateral neural foraminal stenosis without evidence of significant spinal canal  stenosis. IMPRESSION: 1. Acute compression fracture of the L3 vertebral body up to approximately 10% loss of vertebral body height centrally. Fracture planes extend to involve the bilateral pedicles, but there is no bony retropulsion or definite epidural hematoma. 2. Chronic compression deformity of the L2 vertebral body status post vertebral augmentation. 3. Status post posterior decompression at L3-L4 through L5-S1. 4. Multilevel degenerative changes as above resulting in at least moderate spinal canal stenosis at L2-L3 and multilevel neural foraminal stenosis. Electronically Signed   By: Valetta Mole M.D.   On: 10/28/2021 12:39   MR BRAIN WO CONTRAST  Result Date: 11/05/2021 CLINICAL DATA:  Delirium EXAM: MRI HEAD WITHOUT CONTRAST TECHNIQUE: Multiplanar, multiecho pulse sequences of the brain and surrounding structures were obtained without intravenous contrast. COMPARISON:  11/02/2018 MRI, correlation is also made with 10/28/2021 CT head FINDINGS: Brain: No restricted diffusion to suggest acute or subacute infarct. No acute hemorrhage, mass, mass effect, or midline shift. No hydrocephalus or extra-axial collection. Redemonstrated generalized cerebral and cerebellar atrophy with prominence of the ventricles, cisterns, and sulci, which appears overall unchanged compared to 2020. Confluent T2 hyperintense signal in the periventricular white matter and pons, likely the sequela of moderate to severe chronic small vessel ischemic disease. Redemonstrated lacunar infarct in the right posterior lentiform nucleus. Small foci of susceptibility in both cerebral hemispheres as well as the deep nuclei, with new focus in the left mesial temporal lobe, likely sequela of chronic microhemorrhages. Vascular: Normal arterial flow voids. Skull and upper  cervical spine: Normal marrow signal. Sinuses/Orbits: Mucosal thickening in the left-greater-than-right maxillary sinus. Status post bilateral lens replacements. Other: The  mastoids are well aerated. IMPRESSION: No acute intracranial process. No evidence of acute or subacute infarct. Electronically Signed   By: Merilyn Baba M.D.   On: 11/05/2021 00:06      Subjective: Patient seen and examined at bedside this morning.  Hemodynamically stable for discharge today.  Discharge plan discussed with detail with wife at bedside  Discharge Exam: Vitals:   11/05/21 0431 11/05/21 0731  BP: (!) 152/82 (!) 155/89  Pulse: 77 87  Resp: 19 15  Temp: 97.7 F (36.5 C) 98.1 F (36.7 C)  SpO2: 99% 97%   Vitals:   11/04/21 1915 11/04/21 2352 11/05/21 0431 11/05/21 0731  BP: (!) 174/96 (!) 138/93 (!) 152/82 (!) 155/89  Pulse: 95 75 77 87  Resp: '18 20 19 15  '$ Temp: 98.2 F (36.8 C) 98.5 F (36.9 C) 97.7 F (36.5 C) 98.1 F (36.7 C)  TempSrc: Oral Axillary Oral Oral  SpO2:  98% 99% 97%  Weight:      Height:        General: Pt is alert, awake, not in acute distress Cardiovascular: RRR, S1/S2 +, no rubs, no gallops Respiratory: CTA bilaterally, no wheezing, no rhonchi Abdominal: Soft, NT, ND, bowel sounds + Extremities: no edema, no cyanosis    The results of significant diagnostics from this hospitalization (including imaging, microbiology, ancillary and laboratory) are listed below for reference.     Microbiology: No results found for this or any previous visit (from the past 240 hour(s)).   Labs: BNP (last 3 results) No results for input(s): BNP in the last 8760 hours. Basic Metabolic Panel: Recent Labs  Lab 10/30/21 0343 11/02/21 0226 11/03/21 0141 11/04/21 0344 11/05/21 0547  NA 136 140 139 136 139  K 3.9 4.1 4.0 4.1 4.3  CL 102 106 109 108 107  CO2 '25 23 23 23 27  '$ GLUCOSE 154* 120* 147* 165* 158*  BUN 23 32* 37* 23 18  CREATININE 1.26* 1.60* 1.67* 1.30* 1.29*  CALCIUM 8.9 9.0 8.5* 8.4* 9.1   Liver Function Tests: No results for input(s): AST, ALT, ALKPHOS, BILITOT, PROT, ALBUMIN in the last 168 hours. No results for input(s): LIPASE,  AMYLASE in the last 168 hours. Recent Labs  Lab 11/05/21 0547  AMMONIA 12   CBC: Recent Labs  Lab 10/30/21 0343 11/02/21 0226 11/04/21 0344  WBC 8.6 8.0 9.5  HGB 14.8 14.5 13.2  HCT 44.0 43.7 40.1  MCV 95.2 95.8 95.9  PLT 216 274 256   Cardiac Enzymes: No results for input(s): CKTOTAL, CKMB, CKMBINDEX, TROPONINI in the last 168 hours. BNP: Invalid input(s): POCBNP CBG: Recent Labs  Lab 11/04/21 0754 11/04/21 1113 11/04/21 1536 11/04/21 2121 11/05/21 0803  GLUCAP 152* 170* 229* 160* 199*   D-Dimer No results for input(s): DDIMER in the last 72 hours. Hgb A1c No results for input(s): HGBA1C in the last 72 hours. Lipid Profile No results for input(s): CHOL, HDL, LDLCALC, TRIG, CHOLHDL, LDLDIRECT in the last 72 hours. Thyroid function studies No results for input(s): TSH, T4TOTAL, T3FREE, THYROIDAB in the last 72 hours.  Invalid input(s): FREET3 Anemia work up No results for input(s): VITAMINB12, FOLATE, FERRITIN, TIBC, IRON, RETICCTPCT in the last 72 hours. Urinalysis    Component Value Date/Time   COLORURINE YELLOW 08/27/2021 1205   APPEARANCEUR CLEAR 08/27/2021 1205   LABSPEC 1.025 08/27/2021 1205   PHURINE 5.5 08/27/2021 1205  GLUCOSEU NEGATIVE 08/27/2021 [place] 08/27/2021 1205   [place] 08/27/2021 1205   KETONESUR TRACE (A) 08/27/2021 1205   PROTEINUR NEGATIVE 05/01/2011 1415   UROBILINOGEN 0.2 08/27/2021 1205   NITRITE NEGATIVE 08/27/2021 [place] 08/27/2021 1205   Sepsis Labs Invalid input(s): PROCALCITONIN,  WBC,  LACTICIDVEN Microbiology No results found for this or any previous visit (from the past 240 hour(s)).  Please note: You were cared for by a hospitalist during your hospital stay. Once you are discharged, your primary care physician will handle any further medical issues. Please note that NO REFILLS for any discharge medications will be authorized once you are discharged, as it is  imperative that you return to your primary care physician (or establish a relationship with a primary care physician if you do not have one) for your post hospital discharge needs so that they can reassess your need for medications and monitor your lab values.    Time coordinating discharge: 40 minutes  SIGNED:   Shelly Coss, MD  [facility] 11/05/2021, 10:54 AM Pager [phone]  If 7PM-7AM, please contact night-coverage [email or web address] Password [email or web address]

## 2021-11-05 NOTE — Progress Notes (Signed)
Pt s/p L3 balloon kyphoplasty 11/04/21 with Dr. Estanislado Pandy. Pt reports continued pain in back and numbness/tinging to extremities.  Wife at bedside states that pt reported no pain prior to exam.  TLSO back brace in place.  Pt is a poor historian d/t memory loss.  Puncture site unremarkable with no ecchymosis, erythema or swelling. There is a small amt of dried blood on bandage, no active bleeding noted.   Pt to d/c to [facility] today.    Narda Rutherford, AGNP-BC 11/05/2021, 11:17 AM

## 2021-11-05 NOTE — TOC Transition Note (Signed)
Transition of Care [facility]) - CM/SW Discharge Note   Patient Details  Name: Stephen Wilkinson MRN: [id number] Date of Birth: [birth year]  Transition of [facility]) CM/SW Contact:  Coralee Pesa, [place] Phone Number: 11/05/2021, 12:29 PM   Clinical Narrative:    Pt to be transported to [facility] via [place]. Nurse to call report to [phone].    Final next level of care: Skilled Nursing Facility Barriers to Discharge: Barriers Resolved   Patient Goals and CMS Choice        Discharge Placement              Patient chooses bed at: [facility] Patient to be transferred to facility by: [place] Name of family member notified: Spouse Patient and family notified of of transfer: 11/05/21  Discharge Plan and Services In-house Referral: Clinical Social Work                                   Social Determinants of Health (SDOH) Interventions     Readmission Risk Interventions     View : No data to display.

## 2021-11-05 NOTE — Progress Notes (Signed)
Patient discharged per MD order. Patient discharging to [facility] for rehab, report called and given to receiving RN. Patient being transported by [facility], in no acute distress at time of discharge.

## 2021-11-06 ENCOUNTER — Other Ambulatory Visit: Payer: Self-pay | Admitting: *Deleted

## 2021-11-06 DIAGNOSIS — Z7189 Other specified counseling: Secondary | ICD-10-CM | POA: Diagnosis not present

## 2021-11-06 DIAGNOSIS — F32A Depression, unspecified: Secondary | ICD-10-CM | POA: Diagnosis not present

## 2021-11-06 DIAGNOSIS — E440 Moderate protein-calorie malnutrition: Secondary | ICD-10-CM | POA: Diagnosis not present

## 2021-11-06 DIAGNOSIS — H409 Unspecified glaucoma: Secondary | ICD-10-CM | POA: Diagnosis not present

## 2021-11-06 DIAGNOSIS — S32000A Wedge compression fracture of unspecified lumbar vertebra, initial encounter for closed fracture: Secondary | ICD-10-CM | POA: Diagnosis not present

## 2021-11-06 DIAGNOSIS — E1159 Type 2 diabetes mellitus with other circulatory complications: Secondary | ICD-10-CM | POA: Diagnosis not present

## 2021-11-06 DIAGNOSIS — I152 Hypertension secondary to endocrine disorders: Secondary | ICD-10-CM | POA: Diagnosis not present

## 2021-11-06 DIAGNOSIS — E039 Hypothyroidism, unspecified: Secondary | ICD-10-CM | POA: Diagnosis not present

## 2021-11-06 DIAGNOSIS — H3530 Unspecified macular degeneration: Secondary | ICD-10-CM | POA: Diagnosis not present

## 2021-11-06 DIAGNOSIS — R262 Difficulty in walking, not elsewhere classified: Secondary | ICD-10-CM | POA: Diagnosis not present

## 2021-11-06 DIAGNOSIS — I4891 Unspecified atrial fibrillation: Secondary | ICD-10-CM | POA: Diagnosis not present

## 2021-11-06 DIAGNOSIS — R4189 Other symptoms and signs involving cognitive functions and awareness: Secondary | ICD-10-CM | POA: Diagnosis not present

## 2021-11-06 NOTE — Telephone Encounter (Signed)
Pt made appt to see MD 11/18/21.Marland KitchenChryl Heck

## 2021-11-06 NOTE — Patient Outreach (Signed)
Per [employer] eligible member currently resides in [facility].  Screened for potential [facility] care coordination services as a benefit of [employer] plan.  Member's PCP at [facility] has [place] care coordination team available if needed post SNF.  Secure communication sent to facility SW to make aware writer is following for transition plans and [facility] needs.  Will continue to follow.    Marthenia Rolling, MSN, RN,BSN [place] Acute Care Coordinator [phone] [facility]) [phone]  (Toll free office)

## 2021-11-07 DIAGNOSIS — R4189 Other symptoms and signs involving cognitive functions and awareness: Secondary | ICD-10-CM | POA: Diagnosis not present

## 2021-11-07 DIAGNOSIS — S32000D Wedge compression fracture of unspecified lumbar vertebra, subsequent encounter for fracture with routine healing: Secondary | ICD-10-CM | POA: Diagnosis not present

## 2021-11-07 DIAGNOSIS — I4891 Unspecified atrial fibrillation: Secondary | ICD-10-CM | POA: Diagnosis not present

## 2021-11-12 DIAGNOSIS — F05 Delirium due to known physiological condition: Secondary | ICD-10-CM | POA: Diagnosis not present

## 2021-11-12 DIAGNOSIS — F5101 Primary insomnia: Secondary | ICD-10-CM | POA: Diagnosis not present

## 2021-11-13 ENCOUNTER — Other Ambulatory Visit: Payer: Self-pay | Admitting: *Deleted

## 2021-11-13 DIAGNOSIS — I482 Chronic atrial fibrillation, unspecified: Secondary | ICD-10-CM | POA: Diagnosis not present

## 2021-11-13 DIAGNOSIS — M48062 Spinal stenosis, lumbar region with neurogenic claudication: Secondary | ICD-10-CM | POA: Diagnosis not present

## 2021-11-13 DIAGNOSIS — E1142 Type 2 diabetes mellitus with diabetic polyneuropathy: Secondary | ICD-10-CM | POA: Diagnosis not present

## 2021-11-13 DIAGNOSIS — M6281 Muscle weakness (generalized): Secondary | ICD-10-CM | POA: Diagnosis not present

## 2021-11-13 DIAGNOSIS — Z9181 History of falling: Secondary | ICD-10-CM | POA: Diagnosis not present

## 2021-11-13 DIAGNOSIS — E440 Moderate protein-calorie malnutrition: Secondary | ICD-10-CM | POA: Diagnosis not present

## 2021-11-13 DIAGNOSIS — S32000S Wedge compression fracture of unspecified lumbar vertebra, sequela: Secondary | ICD-10-CM | POA: Diagnosis not present

## 2021-11-13 DIAGNOSIS — Z8673 Personal history of transient ischemic attack (TIA), and cerebral infarction without residual deficits: Secondary | ICD-10-CM | POA: Diagnosis not present

## 2021-11-13 DIAGNOSIS — R2681 Unsteadiness on feet: Secondary | ICD-10-CM | POA: Diagnosis not present

## 2021-11-13 NOTE — Patient Outreach (Signed)
[facility] Post- Acute Care Coordinator follow up. Per [place] eligible member currently resides in [place].  Screening for potential [facility] care coordination services as a benefit of [employer] plan.  Member's PCP at [facility] has [place] care coordination team.  Facility site visit to [place] skilled nursing facility. Met with [name], SNF SW concerning member's transition plan and potential [facility] needs. Mr. Alfrey resides with with spouse.  Has some confusion Anticipated transition plan is to return home.   Went to Mr. Wesche's room at [facility]. However, he was off the unit and spouse was not present at the time.  Will follow back at up at later regarding [facility] services. Will continue to follow while member resides in SNF.    Marthenia Rolling, MSN, RN,BSN [place] Acute Care Coordinator [phone] [facility]) [phone]  (Toll free office)

## 2021-11-14 DIAGNOSIS — S32000S Wedge compression fracture of unspecified lumbar vertebra, sequela: Secondary | ICD-10-CM | POA: Diagnosis not present

## 2021-11-14 DIAGNOSIS — Z8673 Personal history of transient ischemic attack (TIA), and cerebral infarction without residual deficits: Secondary | ICD-10-CM | POA: Diagnosis not present

## 2021-11-14 DIAGNOSIS — M6281 Muscle weakness (generalized): Secondary | ICD-10-CM | POA: Diagnosis not present

## 2021-11-14 DIAGNOSIS — I482 Chronic atrial fibrillation, unspecified: Secondary | ICD-10-CM | POA: Diagnosis not present

## 2021-11-14 DIAGNOSIS — E1142 Type 2 diabetes mellitus with diabetic polyneuropathy: Secondary | ICD-10-CM | POA: Diagnosis not present

## 2021-11-14 DIAGNOSIS — E440 Moderate protein-calorie malnutrition: Secondary | ICD-10-CM | POA: Diagnosis not present

## 2021-11-14 DIAGNOSIS — Z9181 History of falling: Secondary | ICD-10-CM | POA: Diagnosis not present

## 2021-11-14 DIAGNOSIS — M48062 Spinal stenosis, lumbar region with neurogenic claudication: Secondary | ICD-10-CM | POA: Diagnosis not present

## 2021-11-14 DIAGNOSIS — R2681 Unsteadiness on feet: Secondary | ICD-10-CM | POA: Diagnosis not present

## 2021-11-18 ENCOUNTER — Encounter (INDEPENDENT_AMBULATORY_CARE_PROVIDER_SITE_OTHER): Payer: Medicare Other | Admitting: Ophthalmology

## 2021-11-18 ENCOUNTER — Ambulatory Visit: Payer: Medicare Other | Admitting: Internal Medicine

## 2021-11-18 DIAGNOSIS — R2681 Unsteadiness on feet: Secondary | ICD-10-CM | POA: Diagnosis not present

## 2021-11-18 DIAGNOSIS — I482 Chronic atrial fibrillation, unspecified: Secondary | ICD-10-CM | POA: Diagnosis not present

## 2021-11-18 DIAGNOSIS — E440 Moderate protein-calorie malnutrition: Secondary | ICD-10-CM | POA: Diagnosis not present

## 2021-11-18 DIAGNOSIS — M6281 Muscle weakness (generalized): Secondary | ICD-10-CM | POA: Diagnosis not present

## 2021-11-18 DIAGNOSIS — M48062 Spinal stenosis, lumbar region with neurogenic claudication: Secondary | ICD-10-CM | POA: Diagnosis not present

## 2021-11-18 DIAGNOSIS — S32000S Wedge compression fracture of unspecified lumbar vertebra, sequela: Secondary | ICD-10-CM | POA: Diagnosis not present

## 2021-11-18 DIAGNOSIS — E1142 Type 2 diabetes mellitus with diabetic polyneuropathy: Secondary | ICD-10-CM | POA: Diagnosis not present

## 2021-11-18 DIAGNOSIS — Z8673 Personal history of transient ischemic attack (TIA), and cerebral infarction without residual deficits: Secondary | ICD-10-CM | POA: Diagnosis not present

## 2021-11-18 DIAGNOSIS — Z9181 History of falling: Secondary | ICD-10-CM | POA: Diagnosis not present

## 2021-11-18 NOTE — H&P (View-Only) (Signed)
HPI: Follow-up hypertension and atrial fibrillation. Also history of TIA. Nuclear study March 2017 showed ejection fraction 62% and no ischemia.  Carotid Dopplers May 2020 showed 1 to 39% bilateral stenosis. Question episodes of syncope at previous ov. Monitor 4/21 showed sinus with PACs, PVCs brief PAT but no pauses.  Found to be in atrial fibrillation at office visit May 30, 2021.  Apixaban was initiated with plans for cardioversion if atrial fibrillation persisted as he did complain of increased fatigue.  Echocardiogram repeated January 2023 and showed normal LV function, mild left ventricular hypertrophy, mild right ventricular enlargement, mild left atrial enlargement, mild to moderate tricuspid regurgitation, mildly dilated ascending aorta, moderate aortic stenosis with mean gradient 12 mmHg, aortic valve area 1.5 cm and dimensionless index 0.3.  TEE February 2023 showed no left atrial appendage thrombus, ejection fraction 55 to 60%, mild biatrial enlargement, mild mitral regurgitation and moderate aortic stenosis.  Patient had successful cardioversion to normal sinus rhythm.  Atrial fibrillation recurred.  Patient presented for cardioversion on April 26 but had missed his Eliquis.  Since last seen, patient recently fell and required kyphoplasty for vertebral fracture.  He is now in rehab.  He denies chest pain, palpitations or syncope.  He has some fatigue and mild dyspnea on exertion since being in atrial fibrillation.  Current Outpatient Medications  Medication Sig Dispense Refill   amiodarone (PACERONE) 200 MG tablet Take 1 tablet (200 mg total) by mouth daily. 90 tablet 3   apixaban (ELIQUIS) 2.5 MG TABS tablet Take 1 tablet (2.5 mg total) by mouth 2 (two) times daily. 60 tablet    Bevacizumab (AVASTIN IV) Place 1 Syringe into the right eye See admin instructions. 1 dose injected in the right eye every 7 weeks     famotidine (PEPCID) 20 MG tablet Take 1 tablet (20 mg total) by mouth  2 (two) times daily. (Patient taking differently: Take 20 mg by mouth in the morning.) 180 tablet 3   glucose blood (ONE TOUCH ULTRA TEST) test strip Use as directed once daily to check blood sugar.  Diagnosis code E11.42 100 each 11   latanoprost (XALATAN) 0.005 % ophthalmic solution Place 1 drop into both eyes at bedtime. 2.5 mL 12   levothyroxine (SYNTHROID) 125 MCG tablet TAKE 1 TABLET EVERY DAY BEFORE BREAKFAST (Patient taking differently: Take 125 mcg by mouth daily before breakfast.) 90 tablet 3   lidocaine (LIDODERM) 5 % Place 1-3 patches onto the skin daily. Remove & Discard patch within 12 hours or as directed by MD 30 patch 0   melatonin 3 MG TABS tablet Take 1 tablet (3 mg total) by mouth at bedtime.  0   metFORMIN (GLUCOPHAGE) 1000 MG tablet TAKE 1 TABLET EVERY DAY (Patient taking differently: Take 1,000 mg by mouth daily with breakfast.) 90 tablet 0   methocarbamol (ROBAXIN) 500 MG tablet Take 1 tablet (500 mg total) by mouth every 6 (six) hours as needed for muscle spasms.     metoprolol succinate (TOPROL-XL) 25 MG 24 hr tablet Take 1 tablet (25 mg total) by mouth every morning. 90 tablet 3   Multiple Vitamins-Minerals (ICAPS AREDS 2 PO) Take 2 capsules by mouth daily.     oxyCODONE (OXY IR/ROXICODONE) 5 MG immediate release tablet Take 1 tablet (5 mg total) by mouth every 6 (six) hours as needed for moderate pain. 15 tablet 0   polyethylene glycol (MIRALAX / GLYCOLAX) 17 g packet Take 17 g by mouth daily. 14 each 0  QUEtiapine (SEROQUEL) 25 MG tablet Take 0.5 tablets (12.5 mg total) by mouth at bedtime.     No current facility-administered medications for this visit.     Past Medical History:  Diagnosis Date   Anxiety    B12 deficiency 09/23/2016   Blood transfusion without reported diagnosis    Cancer ([place])    basal and squamous skin carcinoma    Cataract    Depression    Diabetes mellitus    GERD (gastroesophageal reflux disease)    Glaucoma 01/04/2013   Hx of  transient ischemic attack (TIA) 01/04/2013   Hx: UTI (urinary tract infection)    Hyperlipidemia    Hypertension    Hypothyroidism    Insomnia    Macular degeneration    Neuropathy    OA (osteoarthritis)    Pneumonia    PVC's (premature ventricular contractions)    Renal mass    Stroke ([place])    mid 1990s     Past Surgical History:  Procedure Laterality Date   APPENDECTOMY     CARDIOVERSION N/A 07/30/2021   Procedure: CARDIOVERSION;  Surgeon: Josue Hector, MD;  Location: [facility];  Service: Cardiovascular;  Laterality: N/A;   CATARACT EXTRACTION W/ INTRAOCULAR LENS  IMPLANT, BILATERAL     INGUINAL HERNIA REPAIR     X3   IR KYPHO LUMBAR INC FX REDUCE BONE BX UNI/BIL CANNULATION INC/IMAGING  11/04/2021   LUMBAR LAMINECTOMY/DECOMPRESSION MICRODISCECTOMY N/A 02/12/2017   Procedure: Lumbar Three-Four, Lumbar Four-Five, Lumbar Five-Sacral One Laminectomy and Foraminotomy;  Surgeon: Earnie Larsson, MD;  Location: [facility];  Service: Neurosurgery;  Laterality: N/A;   OTHER SURGICAL HISTORY     microwave procedure for prostate - 09/12    PROSTATE ABLATION     microwave   REPLACEMENT TOTAL KNEE     THYROIDECTOMY     TONSILLECTOMY AND ADENOIDECTOMY     TOTAL KNEE ARTHROPLASTY  05/11/2011   Procedure: TOTAL KNEE ARTHROPLASTY;  Surgeon: Gearlean Alf;  Location: [facility];  Service: Orthopedics;  Laterality: Right;   US ECHOCARDIOGRAPHY  11/10/2006   EF 55-60%   US ECHOCARDIOGRAPHY  09/20/2002   EF 65-70%    Social History   Socioeconomic History   Marital status: Married    Spouse name: Not on file   Number of children: 3   Years of education: Not on file   Highest education level: Not on file  Occupational History   Occupation: retired    Fish farm manager: RETIRED    Comment: [employer]  Tobacco Use   Smoking status: Never   Smokeless tobacco: Never  Vaping Use   Vaping Use: Never used  Substance and Sexual Activity   Alcohol use: Yes    Alcohol/week: 7.0 standard  drinks of alcohol    Types: 7 Shots of liquor per week    Comment: 2 drinks most days   Drug use: No   Sexual activity: Never    Birth control/protection: None  Other Topics Concern   Not on file  Social History Narrative   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, 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[employer]      Home Situation: living with wife      Spiritual Beliefs: episcopalian      Lifestyle: walks daily, working on diet            Social Determinants of Health   Financial Resource Strain: [place]  (09/10/2020)   Overall Financial Resource Strain (CARDIA)    Difficulty of  Paying Living Expenses: Not hard at all  Food Insecurity: No Food Insecurity (09/10/2020)   Hunger Vital Sign    Worried About Running Out of Food in the Last Year: Never true    Ran Out of Food in the Last Year: Never true  Transportation Needs: No Transportation Needs (09/10/2020)   PRAPARE - Hydrologist (Medical): No    Lack of Transportation (Non-Medical): No  Physical Activity: Inactive (09/10/2020)   Exercise Vital Sign    Days of Exercise per Week: 0 days    Minutes of Exercise per Session: 0 min  Stress: No Stress Concern Present (09/10/2020)   [place]    Feeling of Stress : Not at all  Social Connections: Not on file  Intimate Partner Violence: Not on file    Family History  Problem Relation Age of Onset   Hypertension Mother    Arthritis Mother    Diabetes Mother    Hypertension Father    Stroke Father    Kidney disease Father    Hypertension Brother    Diabetes Brother     ROS: no fevers or chills, productive cough, hemoptysis, dysphasia, odynophagia, melena, hematochezia, dysuria, hematuria, rash, seizure activity, orthopnea, PND, pedal edema, claudication. Remaining systems are negative.  Physical Exam: Well-developed well-nourished in no acute distress.  Skin is warm and dry.  HEENT is normal.  Neck is  supple.  Chest is clear to auscultation with normal expansion.  Cardiovascular exam is irregular, 2/6 systolic murmur left sternal border. Abdominal exam nontender or distended. No masses palpated. Extremities show no edema. neuro grossly intact  A/P  1 persistent atrial fibrillation-patient remains in atrial fibrillation on exam.  Continue amiodarone 200 mg daily.  Continue apixaban but increase to therapeutic dose of 5 mg twice daily.  We will plan to proceed with cardioversion in 4 weeks and then continue apixaban for 4 weeks after.  I would then discontinue long-term given his recurrent falls (risk outweighs benefit).  My hope is that he will maintain sinus rhythm on amiodarone with improvement in his symptoms.    2 aortic stenosis-moderate on most recent echocardiogram.  He understands he will likely require TAVR in the future.  3 hypertension-blood pressure controlled.  Continue present medical regimen.  4 hyperlipidemia-managed by primary care.  5 history of TIA-continue apixaban.  Kirk Ruths, MD

## 2021-11-18 NOTE — Progress Notes (Signed)
HPI: Follow-up hypertension and atrial fibrillation. Also history of TIA. Nuclear study March 2017 showed ejection fraction 62% and no ischemia.  Carotid Dopplers May 2020 showed 1 to 39% bilateral stenosis. Question episodes of syncope at previous ov. Monitor 4/21 showed sinus with PACs, PVCs brief PAT but no pauses.  Found to be in atrial fibrillation at office visit May 30, 2021.  Apixaban was initiated with plans for cardioversion if atrial fibrillation persisted as he did complain of increased fatigue.  Echocardiogram repeated January 2023 and showed normal LV function, mild left ventricular hypertrophy, mild right ventricular enlargement, mild left atrial enlargement, mild to moderate tricuspid regurgitation, mildly dilated ascending aorta, moderate aortic stenosis with mean gradient 12 mmHg, aortic valve area 1.5 cm and dimensionless index 0.3.  TEE February 2023 showed no left atrial appendage thrombus, ejection fraction 55 to 60%, mild biatrial enlargement, mild mitral regurgitation and moderate aortic stenosis.  Patient had successful cardioversion to normal sinus rhythm.  Atrial fibrillation recurred.  Patient presented for cardioversion on April 26 but had missed his Eliquis.  Since last seen, patient recently fell and required kyphoplasty for vertebral fracture.  He is now in rehab.  He denies chest pain, palpitations or syncope.  He has some fatigue and mild dyspnea on exertion since being in atrial fibrillation.  Current Outpatient Medications  Medication Sig Dispense Refill   amiodarone (PACERONE) 200 MG tablet Take 1 tablet (200 mg total) by mouth daily. 90 tablet 3   apixaban (ELIQUIS) 2.5 MG TABS tablet Take 1 tablet (2.5 mg total) by mouth 2 (two) times daily. 60 tablet    Bevacizumab (AVASTIN IV) Place 1 Syringe into the right eye See admin instructions. 1 dose injected in the right eye every 7 weeks     famotidine (PEPCID) 20 MG tablet Take 1 tablet (20 mg total) by mouth  2 (two) times daily. (Patient taking differently: Take 20 mg by mouth in the morning.) 180 tablet 3   glucose blood (ONE TOUCH ULTRA TEST) test strip Use as directed once daily to check blood sugar.  Diagnosis code E11.42 100 each 11   latanoprost (XALATAN) 0.005 % ophthalmic solution Place 1 drop into both eyes at bedtime. 2.5 mL 12   levothyroxine (SYNTHROID) 125 MCG tablet TAKE 1 TABLET EVERY DAY BEFORE BREAKFAST (Patient taking differently: Take 125 mcg by mouth daily before breakfast.) 90 tablet 3   lidocaine (LIDODERM) 5 % Place 1-3 patches onto the skin daily. Remove & Discard patch within 12 hours or as directed by MD 30 patch 0   melatonin 3 MG TABS tablet Take 1 tablet (3 mg total) by mouth at bedtime.  0   metFORMIN (GLUCOPHAGE) 1000 MG tablet TAKE 1 TABLET EVERY DAY (Patient taking differently: Take 1,000 mg by mouth daily with breakfast.) 90 tablet 0   methocarbamol (ROBAXIN) 500 MG tablet Take 1 tablet (500 mg total) by mouth every 6 (six) hours as needed for muscle spasms.     metoprolol succinate (TOPROL-XL) 25 MG 24 hr tablet Take 1 tablet (25 mg total) by mouth every morning. 90 tablet 3   Multiple Vitamins-Minerals (ICAPS AREDS 2 PO) Take 2 capsules by mouth daily.     oxyCODONE (OXY IR/ROXICODONE) 5 MG immediate release tablet Take 1 tablet (5 mg total) by mouth every 6 (six) hours as needed for moderate pain. 15 tablet 0   polyethylene glycol (MIRALAX / GLYCOLAX) 17 g packet Take 17 g by mouth daily. 14 each 0  QUEtiapine (SEROQUEL) 25 MG tablet Take 0.5 tablets (12.5 mg total) by mouth at bedtime.     No current facility-administered medications for this visit.     Past Medical History:  Diagnosis Date   Anxiety    B12 deficiency 09/23/2016   Blood transfusion without reported diagnosis    Cancer ([place])    basal and squamous skin carcinoma    Cataract    Depression    Diabetes mellitus    GERD (gastroesophageal reflux disease)    Glaucoma 01/04/2013   Hx of  transient ischemic attack (TIA) 01/04/2013   Hx: UTI (urinary tract infection)    Hyperlipidemia    Hypertension    Hypothyroidism    Insomnia    Macular degeneration    Neuropathy    OA (osteoarthritis)    Pneumonia    PVC's (premature ventricular contractions)    Renal mass    Stroke ([place])    mid 1990s     Past Surgical History:  Procedure Laterality Date   APPENDECTOMY     CARDIOVERSION N/A 07/30/2021   Procedure: CARDIOVERSION;  Surgeon: Josue Hector, MD;  Location: [facility];  Service: Cardiovascular;  Laterality: N/A;   CATARACT EXTRACTION W/ INTRAOCULAR LENS  IMPLANT, BILATERAL     INGUINAL HERNIA REPAIR     X3   IR KYPHO LUMBAR INC FX REDUCE BONE BX UNI/BIL CANNULATION INC/IMAGING  11/04/2021   LUMBAR LAMINECTOMY/DECOMPRESSION MICRODISCECTOMY N/A 02/12/2017   Procedure: Lumbar Three-Four, Lumbar Four-Five, Lumbar Five-Sacral One Laminectomy and Foraminotomy;  Surgeon: Earnie Larsson, MD;  Location: [facility];  Service: Neurosurgery;  Laterality: N/A;   OTHER SURGICAL HISTORY     microwave procedure for prostate - 09/12    PROSTATE ABLATION     microwave   REPLACEMENT TOTAL KNEE     THYROIDECTOMY     TONSILLECTOMY AND ADENOIDECTOMY     TOTAL KNEE ARTHROPLASTY  05/11/2011   Procedure: TOTAL KNEE ARTHROPLASTY;  Surgeon: Gearlean Alf;  Location: [facility];  Service: Orthopedics;  Laterality: Right;   US ECHOCARDIOGRAPHY  11/10/2006   EF 55-60%   US ECHOCARDIOGRAPHY  09/20/2002   EF 65-70%    Social History   Socioeconomic History   Marital status: Married    Spouse name: Not on file   Number of children: 3   Years of education: Not on file   Highest education level: Not on file  Occupational History   Occupation: retired    Fish farm manager: RETIRED    Comment: [employer]  Tobacco Use   Smoking status: Never   Smokeless tobacco: Never  Vaping Use   Vaping Use: Never used  Substance and Sexual Activity   Alcohol use: Yes    Alcohol/week: 7.0 standard  drinks of alcohol    Types: 7 Shots of liquor per week    Comment: 2 drinks most days   Drug use: No   Sexual activity: Never    Birth control/protection: None  Other Topics Concern   Not on file  Social History Narrative   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, 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[employer]      Home Situation: living with wife      Spiritual Beliefs: episcopalian      Lifestyle: walks daily, working on diet            Social Determinants of Health   Financial Resource Strain: [place]  (09/10/2020)   Overall Financial Resource Strain (CARDIA)    Difficulty of  Paying Living Expenses: Not hard at all  Food Insecurity: No Food Insecurity (09/10/2020)   Hunger Vital Sign    Worried About Running Out of Food in the Last Year: Never true    Ran Out of Food in the Last Year: Never true  Transportation Needs: No Transportation Needs (09/10/2020)   PRAPARE - Hydrologist (Medical): No    Lack of Transportation (Non-Medical): No  Physical Activity: Inactive (09/10/2020)   Exercise Vital Sign    Days of Exercise per Week: 0 days    Minutes of Exercise per Session: 0 min  Stress: No Stress Concern Present (09/10/2020)   [place]    Feeling of Stress : Not at all  Social Connections: Not on file  Intimate Partner Violence: Not on file    Family History  Problem Relation Age of Onset   Hypertension Mother    Arthritis Mother    Diabetes Mother    Hypertension Father    Stroke Father    Kidney disease Father    Hypertension Brother    Diabetes Brother     ROS: no fevers or chills, productive cough, hemoptysis, dysphasia, odynophagia, melena, hematochezia, dysuria, hematuria, rash, seizure activity, orthopnea, PND, pedal edema, claudication. Remaining systems are negative.  Physical Exam: Well-developed well-nourished in no acute distress.  Skin is warm and dry.  HEENT is normal.  Neck is  supple.  Chest is clear to auscultation with normal expansion.  Cardiovascular exam is irregular, 2/6 systolic murmur left sternal border. Abdominal exam nontender or distended. No masses palpated. Extremities show no edema. neuro grossly intact  A/P  1 persistent atrial fibrillation-patient remains in atrial fibrillation on exam.  Continue amiodarone 200 mg daily.  Continue apixaban but increase to therapeutic dose of 5 mg twice daily.  We will plan to proceed with cardioversion in 4 weeks and then continue apixaban for 4 weeks after.  I would then discontinue long-term given his recurrent falls (risk outweighs benefit).  My hope is that he will maintain sinus rhythm on amiodarone with improvement in his symptoms.    2 aortic stenosis-moderate on most recent echocardiogram.  He understands he will likely require TAVR in the future.  3 hypertension-blood pressure controlled.  Continue present medical regimen.  4 hyperlipidemia-managed by primary care.  5 history of TIA-continue apixaban.  Kirk Ruths, MD

## 2021-11-19 ENCOUNTER — Encounter (INDEPENDENT_AMBULATORY_CARE_PROVIDER_SITE_OTHER): Payer: Self-pay | Admitting: Ophthalmology

## 2021-11-19 ENCOUNTER — Ambulatory Visit (INDEPENDENT_AMBULATORY_CARE_PROVIDER_SITE_OTHER): Payer: Medicare Other | Admitting: Ophthalmology

## 2021-11-19 DIAGNOSIS — H353113 Nonexudative age-related macular degeneration, right eye, advanced atrophic without subfoveal involvement: Secondary | ICD-10-CM

## 2021-11-19 DIAGNOSIS — H353211 Exudative age-related macular degeneration, right eye, with active choroidal neovascularization: Secondary | ICD-10-CM

## 2021-11-19 DIAGNOSIS — H353124 Nonexudative age-related macular degeneration, left eye, advanced atrophic with subfoveal involvement: Secondary | ICD-10-CM

## 2021-11-19 MED ORDER — BEVACIZUMAB 2.5 MG/0.1ML IZ SOSY
2.5000 mg | PREFILLED_SYRINGE | INTRAVITREAL | Status: AC | PRN
  Administered 2021-11-19: 2.5 mg via INTRAVITREAL

## 2021-11-19 NOTE — Assessment & Plan Note (Addendum)
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

## 2021-11-19 NOTE — Assessment & Plan Note (Signed)
Doing well OD at 8-week follow-up interval post Avastin.  No recurrence of disease, no sign of CNVM today

## 2021-11-19 NOTE — Assessment & Plan Note (Signed)
Accounts for acuity OS 

## 2021-11-19 NOTE — Progress Notes (Signed)
11/19/2021     CHIEF COMPLAINT Patient presents for  Chief Complaint  Patient presents with   Macular Degeneration      HISTORY OF PRESENT ILLNESS: Stephen Wilkinson is a 88 y.o. male who presents to the clinic today for:     Referring physician: Biagio Borg, MD [place],  [place] [id number]  HISTORICAL INFORMATION:   Selected notes from the MEDICAL RECORD NUMBER    Lab Results  Component Value Date   HGBA1C 7.4 (H) 08/27/2021     CURRENT MEDICATIONS: Current Outpatient Medications (Ophthalmic Drugs)  Medication Sig   latanoprost (XALATAN) 0.005 % ophthalmic solution Place 1 drop into both eyes at bedtime.   No current facility-administered medications for this visit. (Ophthalmic Drugs)   Current Outpatient Medications (Other)  Medication Sig   amiodarone (PACERONE) 200 MG tablet Take 1 tablet (200 mg total) by mouth daily.   apixaban (ELIQUIS) 2.5 MG TABS tablet Take 1 tablet (2.5 mg total) by mouth 2 (two) times daily.   Bevacizumab (AVASTIN IV) Place 1 Syringe into the right eye See admin instructions. 1 dose injected in the right eye every 7 weeks   famotidine (PEPCID) 20 MG tablet Take 1 tablet (20 mg total) by mouth 2 (two) times daily. (Patient taking differently: Take 20 mg by mouth in the morning.)   glucose blood (ONE TOUCH ULTRA TEST) test strip Use as directed once daily to check blood sugar.  Diagnosis code E11.42   levothyroxine (SYNTHROID) 125 MCG tablet TAKE 1 TABLET EVERY DAY BEFORE BREAKFAST (Patient taking differently: Take 125 mcg by mouth daily before breakfast.)   lidocaine (LIDODERM) 5 % Place 1-3 patches onto the skin daily. Remove & Discard patch within 12 hours or as directed by MD   melatonin 3 MG TABS tablet Take 1 tablet (3 mg total) by mouth at bedtime.   metFORMIN (GLUCOPHAGE) 1000 MG tablet TAKE 1 TABLET EVERY DAY (Patient taking differently: Take 1,000 mg by mouth daily with breakfast.)   methocarbamol (ROBAXIN) 500 MG tablet  Take 1 tablet (500 mg total) by mouth every 6 (six) hours as needed for muscle spasms.   metoprolol succinate (TOPROL-XL) 25 MG 24 hr tablet Take 1 tablet (25 mg total) by mouth every morning.   Multiple Vitamins-Minerals (ICAPS AREDS 2 PO) Take 2 capsules by mouth daily.   oxyCODONE (OXY IR/ROXICODONE) 5 MG immediate release tablet Take 1 tablet (5 mg total) by mouth every 6 (six) hours as needed for moderate pain.   polyethylene glycol (MIRALAX / GLYCOLAX) 17 g packet Take 17 g by mouth daily.   QUEtiapine (SEROQUEL) 25 MG tablet Take 0.5 tablets (12.5 mg total) by mouth at bedtime.   No current facility-administered medications for this visit. (Other)      REVIEW OF SYSTEMS: ROS   Negative for: Constitutional, Gastrointestinal, Neurological, Skin, Genitourinary, Musculoskeletal, HENT, Endocrine, Cardiovascular, Eyes, Respiratory, Psychiatric, Allergic/Imm, Heme/Lymph Last edited by Hurman Horn, MD on 11/19/2021 10:29 AM.       ALLERGIES No Known Allergies  PAST MEDICAL HISTORY Past Medical History:  Diagnosis Date   Anxiety    B12 deficiency 09/23/2016   Blood transfusion without reported diagnosis    Cancer ([place])    basal and squamous skin carcinoma    Cataract    Depression    Diabetes mellitus    GERD (gastroesophageal reflux disease)    Glaucoma 01/04/2013   Hx of transient ischemic attack (TIA) 01/04/2013   Hx: UTI (urinary  tract infection)    Hyperlipidemia    Hypertension    Hypothyroidism    Insomnia    Macular degeneration    Neuropathy    OA (osteoarthritis)    Pneumonia    PVC's (premature ventricular contractions)    Renal mass    Stroke ([place])    mid 1990s    Past Surgical History:  Procedure Laterality Date   APPENDECTOMY     CARDIOVERSION N/A 07/30/2021   Procedure: CARDIOVERSION;  Surgeon: Josue Hector, MD;  [facility];  Service: Cardiovascular;  Laterality: N/A;   CATARACT EXTRACTION W/ INTRAOCULAR LENS  IMPLANT, BILATERAL      INGUINAL HERNIA REPAIR     X3   IR KYPHO LUMBAR INC FX REDUCE BONE BX UNI/BIL CANNULATION INC/IMAGING  11/04/2021   LUMBAR LAMINECTOMY/DECOMPRESSION MICRODISCECTOMY N/A 02/12/2017   Procedure: Lumbar Three-Four, Lumbar Four-Five, Lumbar Five-Sacral One Laminectomy and Foraminotomy;  Surgeon: Earnie Larsson, MD;  Location: [facility];  Service: Neurosurgery;  Laterality: N/A;   OTHER SURGICAL HISTORY     microwave procedure for prostate - 09/12    PROSTATE ABLATION     microwave   REPLACEMENT TOTAL KNEE     THYROIDECTOMY     TONSILLECTOMY AND ADENOIDECTOMY     TOTAL KNEE ARTHROPLASTY  05/11/2011   Procedure: TOTAL KNEE ARTHROPLASTY;  Surgeon: Gearlean Alf;  Location: [facility];  Service: Orthopedics;  Laterality: Right;   US ECHOCARDIOGRAPHY  11/10/2006   EF 55-60%   US ECHOCARDIOGRAPHY  09/20/2002   EF 65-70%    FAMILY HISTORY Family History  Problem Relation Age of Onset   Hypertension Mother    Arthritis Mother    Diabetes Mother    Hypertension Father    Stroke Father    Kidney disease Father    Hypertension Brother    Diabetes Brother     SOCIAL HISTORY Social History   Tobacco Use   Smoking status: Never   Smokeless tobacco: Never  Vaping Use   Vaping Use: Never used  Substance Use Topics   Alcohol use: Yes    Alcohol/week: 7.0 standard drinks    Types: 7 Shots of liquor per week    Comment: 2 drinks most days   Drug use: No         OPHTHALMIC EXAM:  Base Eye Exam     Visual Acuity (ETDRS)       Right Left   Dist [place] 20/30 -2 CF at 1'         Tonometry (Tonopen, 10:31 AM)       Right Left   Pressure 14 14         Visual Fields       Left Right   Restrictions Partial inner superior temporal, inferior temporal, superior nasal, inferior nasal deficiencies          Neuro/Psych     Oriented x3: Yes   Mood/Affect: Normal         Dilation     Right eye: 1.0% Mydriacyl, 2.5% Phenylephrine @ 10:29 AM           Slit Lamp and Fundus Exam      External Exam       Right Left   External Normal Normal         Slit Lamp Exam       Right Left   Lids/Lashes Normal Normal   Conjunctiva/Sclera White and quiet White and quiet   Cornea Clear Clear   Anterior  Chamber Deep and quiet Deep and quiet   Iris Round and reactive Round and reactive   Lens Posterior chamber intraocular lens Posterior chamber intraocular lens   Anterior Vitreous Normal Normal         Fundus Exam       Right Left   Posterior Vitreous Normal Clear and vitrectomized   Disc Normal Normal   C/D Ratio 0.35 0.35   Macula Advanced age related macular degeneration, Geographic atrophy, Hard drusen, no exudates, no clinical Subretinal neovascular membrane, no hemorrhage, Retinal pigment epithelial mottling, Retinal pigment epithelial atrophy, small hemorrhage on nonfoveal side of atrophy, temporally but not larger Advanced age related macular degeneration, Pigmented atrophy, Retinal pigment epithelial atrophy - geographic   Vessels Normal Normal   Periphery Normal Normal            IMAGING AND PROCEDURES  Imaging and Procedures for 11/19/21  OCT, Retina - OU - Both Eyes       Right Eye Quality was good. Scan locations included subfoveal. Central Foveal Thickness: 254. Progression has improved. Findings include abnormal foveal contour, outer retinal atrophy, inner retinal atrophy.   Left Eye Quality was good. Scan locations included subfoveal. Central Foveal Thickness: 243. Progression has been stable. Findings include epiretinal membrane, outer retinal atrophy, inner retinal atrophy.   Notes OS with minor epiretinal membrane nasal aspect of fovea yet with atrophy no or intervention, with significant subfoveal geographic atrophy and scarring from prior CNVM And dry AMD   OD, no sign of recurrence of CNVM 8-week interval today, stable overall no signs of intraretinal fluid or subretinal fluid     Intravitreal Injection, Pharmacologic Agent - OD  - Right Eye       Time Out 11/19/2021. 10:34 AM. Confirmed correct patient, procedure, site, and patient consented.   Anesthesia Topical anesthesia was used. Anesthetic medications included Lidocaine 4%.   Procedure Preparation included 5% betadine to ocular surface, 10% betadine to eyelids, Tobramycin 0.3%. A 30 gauge needle was used.   Injection: 2.5 mg bevacizumab 2.5 MG/0.1ML   Route: Intravitreal, Site: Right Eye   NDC: [phone], Lot: [id number], Expiration date: 01/11/2022   Post-op Post injection exam found visual acuity of at least counting fingers. The patient tolerated the procedure well. There were no complications. The patient received written and verbal post procedure care education. Post injection medications included ocuflox.              ASSESSMENT/PLAN:  Exudative age-related macular degeneration of right eye with active choroidal neovascularization (HCC) Doing well OD at 8-week follow-up interval post Avastin.  No recurrence of disease, no sign of CNVM today  Advanced nonexudative age-related macular degeneration of left eye with subfoveal involvement Accounts for acuity OS  Advanced nonexudative age-related macular degeneration of right eye without subfoveal involvement Geographic atrophy adjacent to fovea may be candidate for Syfovre in the future      ICD-10-CM   1. Exudative age-related macular degeneration of right eye with active choroidal neovascularization (HCC)  H35.3211 OCT, Retina - OU - Both Eyes    Intravitreal Injection, Pharmacologic Agent - OD - Right Eye    bevacizumab (AVASTIN) SOSY 2.5 mg    2. Advanced nonexudative age-related macular degeneration of left eye with subfoveal involvement  H35.3124 OCT, Retina - OU - Both Eyes    Intravitreal Injection, Pharmacologic Agent - OD - Right Eye    bevacizumab (AVASTIN) SOSY 2.5 mg    3. Advanced nonexudative age-related macular degeneration of right eye without  subfoveal involvement  H35.3113        1.  OD doing well at 8 weeks post injection Avastin for wet AMD with multiple recurrences in the past and this monocular patient preserved acuity.  2.  Repeat injection Avastin OD today reevaluate OD again in 8 weeks  3.  Ophthalmic Meds Ordered this visit:  Meds ordered this encounter  Medications   bevacizumab (AVASTIN) SOSY 2.5 mg       Return in about 8 weeks (around 01/14/2022) for dilate, OD, AVASTIN OCT.  There are no Patient Instructions on file for this visit.   Explained the diagnoses, plan, and follow up with the patient and they expressed understanding.  Patient expressed understanding of the importance of proper follow up care.   Clent Demark Jermany Sundell M.D. Diseases & Surgery of the Retina and Vitreous [facility] [place] 11/19/21     Abbreviations: M myopia (nearsighted); A astigmatism; H hyperopia (farsighted); P presbyopia; Mrx spectacle prescription;  CTL contact lenses; OD right eye; OS left eye; OU both eyes  XT exotropia; ET esotropia; PEK punctate epithelial keratitis; PEE punctate epithelial erosions; DES dry eye syndrome; MGD meibomian gland dysfunction; ATs artificial tears; PFAT's preservative free artificial tears; [place] nuclear sclerotic cataract; PSC posterior subcapsular cataract; ERM epi-retinal membrane; PVD posterior vitreous detachment; RD retinal detachment; DM diabetes mellitus; DR diabetic retinopathy; NPDR non-proliferative diabetic retinopathy; PDR proliferative diabetic retinopathy; CSME clinically significant macular edema; DME diabetic macular edema; dbh dot blot hemorrhages; CWS cotton wool spot; POAG primary open angle glaucoma; C/D cup-to-disc ratio; HVF humphrey visual field; GVF goldmann visual field; OCT optical coherence tomography; IOP intraocular pressure; BRVO Branch retinal vein occlusion; CRVO central retinal vein occlusion; CRAO central retinal artery occlusion; BRAO branch retinal artery occlusion; RT retinal tear; SB scleral  buckle; PPV pars plana vitrectomy; VH Vitreous hemorrhage; PRP panretinal laser photocoagulation; IVK intravitreal kenalog; VMT vitreomacular traction; MH Macular hole;  NVD neovascularization of the disc; NVE neovascularization elsewhere; AREDS age related eye disease study; ARMD age related macular degeneration; POAG primary open angle glaucoma; EBMD epithelial/anterior basement membrane dystrophy; ACIOL anterior chamber intraocular lens; IOL intraocular lens; PCIOL posterior chamber intraocular lens; Phaco/IOL phacoemulsification with intraocular lens placement; Reader photorefractive keratectomy; LASIK laser assisted in situ keratomileusis; HTN hypertension; DM diabetes mellitus; COPD chronic obstructive pulmonary disease

## 2021-11-21 ENCOUNTER — Encounter: Payer: Self-pay | Admitting: Cardiology

## 2021-11-21 ENCOUNTER — Ambulatory Visit (INDEPENDENT_AMBULATORY_CARE_PROVIDER_SITE_OTHER): Payer: Medicare Other | Admitting: Cardiology

## 2021-11-21 VITALS — BP 128/82 | HR 83 | Ht 73.0 in | Wt 165.0 lb

## 2021-11-21 DIAGNOSIS — I350 Nonrheumatic aortic (valve) stenosis: Secondary | ICD-10-CM

## 2021-11-21 DIAGNOSIS — I10 Essential (primary) hypertension: Secondary | ICD-10-CM | POA: Diagnosis not present

## 2021-11-21 DIAGNOSIS — I4819 Other persistent atrial fibrillation: Secondary | ICD-10-CM | POA: Diagnosis not present

## 2021-11-21 DIAGNOSIS — E7800 Pure hypercholesterolemia, unspecified: Secondary | ICD-10-CM

## 2021-11-21 DIAGNOSIS — E039 Hypothyroidism, unspecified: Secondary | ICD-10-CM | POA: Diagnosis not present

## 2021-11-21 DIAGNOSIS — S32000D Wedge compression fracture of unspecified lumbar vertebra, subsequent encounter for fracture with routine healing: Secondary | ICD-10-CM | POA: Diagnosis not present

## 2021-11-21 DIAGNOSIS — I4891 Unspecified atrial fibrillation: Secondary | ICD-10-CM | POA: Diagnosis not present

## 2021-11-21 DIAGNOSIS — E1159 Type 2 diabetes mellitus with other circulatory complications: Secondary | ICD-10-CM | POA: Diagnosis not present

## 2021-11-21 NOTE — Patient Instructions (Signed)
Medication Instructions:   INCREASE ELIQUIS TO 5 MG ONE TABLET TWICE DAILY  *If you need a refill on your cardiac medications before your next appointment, please call your pharmacy*   Testing/Procedures:  You are scheduled for a Cardioversion on MONDAY 12/15/21 with Dr. Marlou Porch.  Please arrive at the [facility] (Main Entrance A) at [facility]: [address] [place], [place] [address] at 10 am. (1 hour prior to procedure unless lab work is needed; if lab work is needed arrive 1.5 hours ahead)  DIET: Nothing to eat or drink after midnight except a sip of water with medications (see medication instructions below)  FYI: For your safety, and to allow [place] to monitor your vital signs accurately during the surgery/procedure we request that   if you have artificial nails, gel coating, SNS etc. Please have those removed prior to your surgery/procedure. Not having the nail coverings /polish removed may result in cancellation or delay of your surgery/procedure.   Medication Instructions:  DO NOT TAKE METOPROLOL THE MORNING OF THE PROCEDURE  Continue your anticoagulant: ELIQUIS   You must have a responsible person to drive you home and stay in the waiting area during your procedure. Failure to do so could result in cancellation.  Bring your insurance cards.  *Special Note: Every effort is made to have your procedure done on time. Occasionally there are emergencies that occur at the hospital that may cause delays. Please be patient if a delay does occur.     Follow-Up: At [facility], you and your health needs are our priority.  As part of our continuing mission to provide you with exceptional heart care, we have created designated Provider Care Teams.  These Care Teams include your primary Cardiologist (physician) and Advanced Practice Providers (APPs -  Physician Assistants and Nurse Practitioners) who all work together to provide you with the care you need, when you need it.  We  recommend signing up for the patient portal called "MyChart".  Sign up information is provided on this After Visit Summary.  MyChart is used to connect with patients for Virtual Visits (Telemedicine).  Patients are able to view lab/test results, encounter notes, upcoming appointments, etc.  Non-urgent messages can be sent to your provider as well.   To learn more about what you can do with MyChart, go to [email or web address].    Your next appointment:   12 week(s)  The format for your next appointment:   In Person  Provider:   Kirk Ruths, MD       Important Information About Sugar

## 2021-11-21 NOTE — Addendum Note (Signed)
Addended by: Cristopher Estimable on: 11/21/2021 03:28 PM   Modules accepted: Orders

## 2021-11-22 DIAGNOSIS — M48062 Spinal stenosis, lumbar region with neurogenic claudication: Secondary | ICD-10-CM | POA: Diagnosis not present

## 2021-11-22 DIAGNOSIS — Z8673 Personal history of transient ischemic attack (TIA), and cerebral infarction without residual deficits: Secondary | ICD-10-CM | POA: Diagnosis not present

## 2021-11-22 DIAGNOSIS — M6281 Muscle weakness (generalized): Secondary | ICD-10-CM | POA: Diagnosis not present

## 2021-11-22 DIAGNOSIS — S32000S Wedge compression fracture of unspecified lumbar vertebra, sequela: Secondary | ICD-10-CM | POA: Diagnosis not present

## 2021-11-22 DIAGNOSIS — E1142 Type 2 diabetes mellitus with diabetic polyneuropathy: Secondary | ICD-10-CM | POA: Diagnosis not present

## 2021-11-22 DIAGNOSIS — E440 Moderate protein-calorie malnutrition: Secondary | ICD-10-CM | POA: Diagnosis not present

## 2021-11-22 DIAGNOSIS — I482 Chronic atrial fibrillation, unspecified: Secondary | ICD-10-CM | POA: Diagnosis not present

## 2021-11-22 DIAGNOSIS — R2681 Unsteadiness on feet: Secondary | ICD-10-CM | POA: Diagnosis not present

## 2021-11-22 DIAGNOSIS — Z9181 History of falling: Secondary | ICD-10-CM | POA: Diagnosis not present

## 2021-11-26 DIAGNOSIS — S32030D Wedge compression fracture of third lumbar vertebra, subsequent encounter for fracture with routine healing: Secondary | ICD-10-CM | POA: Diagnosis not present

## 2021-11-26 DIAGNOSIS — I152 Hypertension secondary to endocrine disorders: Secondary | ICD-10-CM | POA: Diagnosis not present

## 2021-11-26 DIAGNOSIS — H409 Unspecified glaucoma: Secondary | ICD-10-CM | POA: Diagnosis not present

## 2021-11-26 DIAGNOSIS — E039 Hypothyroidism, unspecified: Secondary | ICD-10-CM | POA: Diagnosis not present

## 2021-11-26 DIAGNOSIS — Z7189 Other specified counseling: Secondary | ICD-10-CM | POA: Diagnosis not present

## 2021-11-26 DIAGNOSIS — F0390 Unspecified dementia without behavioral disturbance: Secondary | ICD-10-CM | POA: Diagnosis not present

## 2021-11-26 DIAGNOSIS — F32A Depression, unspecified: Secondary | ICD-10-CM | POA: Diagnosis not present

## 2021-11-26 DIAGNOSIS — I4891 Unspecified atrial fibrillation: Secondary | ICD-10-CM | POA: Diagnosis not present

## 2021-11-26 DIAGNOSIS — E1159 Type 2 diabetes mellitus with other circulatory complications: Secondary | ICD-10-CM | POA: Diagnosis not present

## 2021-11-26 DIAGNOSIS — H3530 Unspecified macular degeneration: Secondary | ICD-10-CM | POA: Diagnosis not present

## 2021-11-26 DIAGNOSIS — N189 Chronic kidney disease, unspecified: Secondary | ICD-10-CM | POA: Diagnosis not present

## 2021-11-26 DIAGNOSIS — R262 Difficulty in walking, not elsewhere classified: Secondary | ICD-10-CM | POA: Diagnosis not present

## 2021-11-26 DIAGNOSIS — E440 Moderate protein-calorie malnutrition: Secondary | ICD-10-CM | POA: Diagnosis not present

## 2021-11-28 ENCOUNTER — Encounter: Payer: Self-pay | Admitting: Internal Medicine

## 2021-11-28 ENCOUNTER — Ambulatory Visit (INDEPENDENT_AMBULATORY_CARE_PROVIDER_SITE_OTHER): Payer: Medicare Other | Admitting: Internal Medicine

## 2021-11-28 VITALS — BP 106/70 | HR 80 | Temp 97.7°F | Ht 73.0 in | Wt 171.0 lb

## 2021-11-28 DIAGNOSIS — F419 Anxiety disorder, unspecified: Secondary | ICD-10-CM

## 2021-11-28 DIAGNOSIS — H900 Conductive hearing loss, bilateral: Secondary | ICD-10-CM | POA: Diagnosis not present

## 2021-11-28 DIAGNOSIS — M545 Low back pain, unspecified: Secondary | ICD-10-CM | POA: Diagnosis not present

## 2021-11-28 DIAGNOSIS — R269 Unspecified abnormalities of gait and mobility: Secondary | ICD-10-CM | POA: Diagnosis not present

## 2021-11-28 DIAGNOSIS — E1142 Type 2 diabetes mellitus with diabetic polyneuropathy: Secondary | ICD-10-CM

## 2021-11-28 NOTE — Patient Instructions (Signed)
You will be contacted regarding the referral for: Home health with Aide and PT  Please continue all other medications as before, and tramadol for pain  Please have the pharmacy call with any other refills you may need.  Please keep your appointments with your specialists as you may have planned - Cardiology with the cardioversion  Please remember to consider the OTC hearing aide like at [facility]  Please make an Appointment to return in 6 months, or sooner if needed

## 2021-11-28 NOTE — Progress Notes (Unsigned)
Patient ID: Stephen Wilkinson, male   DOB: [birth year], 88 y.o.   MRN: [id number]        Chief Complaint: follow up recent may 26 L3 compression fx kyphoplasty       HPI:  Stephen Wilkinson is a 88 y.o. male here with c/o persistent LBP though improved after L3 compression fx kyphoplasty may 26 , has finished 2 wks at [facility] place with PT, now needs f/u PT/OT at home.  Pain still about 5/10.  Pt denies chest pain, increased sob or doe, wheezing, orthopnea, PND, increased LE swelling, palpitations, dizziness or syncope.   Pt denies polydipsia, polyuria, or new focal neuro s/s.  No other new complaints.  Denies worsening depressive symptoms, suicidal ideation, or panic;  Likely to have seceond cardioversion July 2 per wife.  Wt Readings from Last 3 Encounters:  11/28/21 171 lb (77.6 kg)  11/21/21 165 lb (74.8 kg)  10/28/21 169 lb 12.1 oz (77 kg)   BP Readings from Last 3 Encounters:  11/28/21 106/70  11/21/21 128/82  11/05/21 108/80         Past Medical History:  Diagnosis Date   Anxiety    B12 deficiency 09/23/2016   Blood transfusion without reported diagnosis    Cancer ([place])    basal and squamous skin carcinoma    Cataract    Depression    Diabetes mellitus    GERD (gastroesophageal reflux disease)    Glaucoma 01/04/2013   Hx of transient ischemic attack (TIA) 01/04/2013   Hx: UTI (urinary tract infection)    Hyperlipidemia    Hypertension    Hypothyroidism    Insomnia    Macular degeneration    Neuropathy    OA (osteoarthritis)    Pneumonia    PVC's (premature ventricular contractions)    Renal mass    Stroke ([place])    mid 1990s    Past Surgical History:  Procedure Laterality Date   APPENDECTOMY     CARDIOVERSION N/A 07/30/2021   Procedure: CARDIOVERSION;  Surgeon: Josue Hector, MD;  [facility];  Service: Cardiovascular;  Laterality: N/A;   CATARACT EXTRACTION W/ INTRAOCULAR LENS  IMPLANT, BILATERAL     INGUINAL HERNIA REPAIR     X3   IR KYPHO LUMBAR INC FX REDUCE BONE  BX UNI/BIL CANNULATION INC/IMAGING  11/04/2021   LUMBAR LAMINECTOMY/DECOMPRESSION MICRODISCECTOMY N/A 02/12/2017   Procedure: Lumbar Three-Four, Lumbar Four-Five, Lumbar Five-Sacral One Laminectomy and Foraminotomy;  Surgeon: Earnie Larsson, MD;  Location: [facility];  Service: Neurosurgery;  Laterality: N/A;   OTHER SURGICAL HISTORY     microwave procedure for prostate - 09/12    PROSTATE ABLATION     microwave   REPLACEMENT TOTAL KNEE     THYROIDECTOMY     TONSILLECTOMY AND ADENOIDECTOMY     TOTAL KNEE ARTHROPLASTY  05/11/2011   Procedure: TOTAL KNEE ARTHROPLASTY;  Surgeon: Gearlean Alf;  [facility];  Service: Orthopedics;  Laterality: Right;   US ECHOCARDIOGRAPHY  11/10/2006   EF 55-60%   US ECHOCARDIOGRAPHY  09/20/2002   EF 65-70%    reports that he has never smoked. He has never used smokeless tobacco. He reports current alcohol use of about 7.0 standard drinks of alcohol per week. He reports that he does not use drugs. family history includes Arthritis in his mother; Diabetes in his brother and mother; Hypertension in his brother, father, and mother; Kidney disease in his father; Stroke in his father. No Known Allergies Current Outpatient Medications on File  Prior to Visit  Medication Sig Dispense Refill   amiodarone (PACERONE) 200 MG tablet Take 1 tablet (200 mg total) by mouth daily. 90 tablet 3   apixaban (ELIQUIS) 2.5 MG TABS tablet Take 1 tablet (2.5 mg total) by mouth 2 (two) times daily. 60 tablet    Bevacizumab (AVASTIN IV) Place 1 Syringe into the right eye See admin instructions. 1 dose injected in the right eye every 7 weeks     famotidine (PEPCID) 20 MG tablet Take 1 tablet (20 mg total) by mouth 2 (two) times daily. (Patient taking differently: Take 20 mg by mouth in the morning.) 180 tablet 3   glucose blood (ONE TOUCH ULTRA TEST) test strip Use as directed once daily to check blood sugar.  Diagnosis code E11.42 100 each 11   latanoprost (XALATAN) 0.005 % ophthalmic  solution Place 1 drop into both eyes at bedtime. 2.5 mL 12   levothyroxine (SYNTHROID) 125 MCG tablet TAKE 1 TABLET EVERY DAY BEFORE BREAKFAST (Patient taking differently: Take 125 mcg by mouth daily before breakfast.) 90 tablet 3   lidocaine (LIDODERM) 5 % Place 1-3 patches onto the skin daily. Remove & Discard patch within 12 hours or as directed by MD 30 patch 0   melatonin 3 MG TABS tablet Take 1 tablet (3 mg total) by mouth at bedtime.  0   metFORMIN (GLUCOPHAGE) 1000 MG tablet TAKE 1 TABLET EVERY DAY (Patient taking differently: Take 1,000 mg by mouth daily with breakfast.) 90 tablet 0   methocarbamol (ROBAXIN) 500 MG tablet Take 1 tablet (500 mg total) by mouth every 6 (six) hours as needed for muscle spasms.     metoprolol succinate (TOPROL-XL) 25 MG 24 hr tablet Take 1 tablet (25 mg total) by mouth every morning. 90 tablet 3   Multiple Vitamins-Minerals (ICAPS AREDS 2 PO) Take 2 capsules by mouth daily.     polyethylene glycol (MIRALAX / GLYCOLAX) 17 g packet Take 17 g by mouth daily. 14 each 0   QUEtiapine (SEROQUEL) 25 MG tablet Take 0.5 tablets (12.5 mg total) by mouth at bedtime.     traMADol (ULTRAM) 50 MG tablet Take 50 mg by mouth daily as needed. (Patient not taking: Reported on 11/28/2021)     No current facility-administered medications on file prior to visit.        ROS:  All others reviewed and negative.  Objective        PE:  BP 106/70 (BP Location: Right Arm, Patient Position: Sitting, Cuff Size: Normal)   Pulse 80   Temp 97.7 F (36.5 C) (Oral)   Ht '6\' 1"'$  (1.854 m)   Wt 171 lb (77.6 kg) Comment: patient weighed with walker and back brace on  SpO2 98%   BMI 22.56 kg/m                 Constitutional: Pt appears in NAD               HENT: Head: NCAT.                Right Ear: External ear normal.                 Left Ear: External ear normal.                Eyes: . Pupils are equal, round, and reactive to light. Conjunctivae and EOM are normal               Nose:  without  d/c or deformity               Neck: Neck supple. Gross normal ROM               Cardiovascular: Normal rate and regular rhythm.                 Pulmonary/Chest: Effort normal and breath sounds without rales or wheezing.                Abd:  Soft, NT, ND, + BS, no organomegaly               Neurological: Pt is alert. At baseline orientation, motor grossly intact               Skin: Skin is warm. No rashes, no other new lesions, LE edema - none               Psychiatric: Pt behavior is normal without agitation   Micro: none  Cardiac tracings I have personally interpreted today:  none  Pertinent Radiological findings (summarize): none   Lab Results  Component Value Date   WBC 9.5 11/04/2021   HGB 13.2 11/04/2021   HCT 40.1 11/04/2021   PLT 256 11/04/2021   GLUCOSE 158 (H) 11/05/2021   CHOL 177 08/27/2021   TRIG 75.0 08/27/2021   HDL 61.10 08/27/2021   LDLCALC 101 (H) 08/27/2021   ALT 15 10/28/2021   AST 20 10/28/2021   NA 139 11/05/2021   K 4.3 11/05/2021   CL 107 11/05/2021   CREATININE 1.29 (H) 11/05/2021   BUN 18 11/05/2021   CO2 27 11/05/2021   TSH 0.67 08/27/2021   INR 1.0 11/04/2021   HGBA1C 7.4 (H) 08/27/2021   MICROALBUR 0.8 10/09/2020   Assessment/Plan:  Stephen Wilkinson is a 52 y.o. White or Caucasian [1] male with  has a past medical history of Anxiety, B12 deficiency (09/23/2016), Blood transfusion without reported diagnosis, Cancer ([place]), Cataract, Depression, Diabetes mellitus, GERD (gastroesophageal reflux disease), Glaucoma (01/04/2013), transient ischemic attack (TIA) (01/04/2013), UTI (urinary tract infection), Hyperlipidemia, Hypertension, Hypothyroidism, Insomnia, Macular degeneration, Neuropathy, OA (osteoarthritis), Pneumonia, PVC's (premature ventricular contractions), Renal mass, and Stroke ([place]).  Low back pain With persistent mild gait instability, for tramadol prn, and refer home PT/OT  Gait disorder For [facility] PT /OT  DM type 2 with diabetic  peripheral neuropathy ([place]) Lab Results  Component Value Date   HGBA1C 7.4 (H) 08/27/2021   Stable, pt to continue current medical treatment metformin 1000 mg qd   Anxiety Stable overall, cont current med tx, declines need for change or referral  Hearing loss, bilateral Seems mild worse this exam, pt advised for otc hearing aids  Followup: No follow-ups on file.  Cathlean Cower, MD 11/30/2021 8:15 PM [facility]

## 2021-11-30 ENCOUNTER — Encounter: Payer: Self-pay | Admitting: Internal Medicine

## 2021-11-30 NOTE — Assessment & Plan Note (Signed)
Stable overall, cont current med tx, declines need for change or referral

## 2021-11-30 NOTE — Assessment & Plan Note (Signed)
With persistent mild gait instability, for tramadol prn, and refer home PT/OT

## 2021-11-30 NOTE — Assessment & Plan Note (Signed)
Lab Results  Component Value Date   HGBA1C 7.4 (H) 08/27/2021   Stable, pt to continue current medical treatment metformin 1000 mg qd

## 2021-11-30 NOTE — Assessment & Plan Note (Signed)
For [facility] PT /OT

## 2021-11-30 NOTE — Assessment & Plan Note (Signed)
Seems mild worse this exam, pt advised for otc hearing aids

## 2021-12-03 ENCOUNTER — Other Ambulatory Visit: Payer: Self-pay | Admitting: *Deleted

## 2021-12-03 NOTE — Patient Outreach (Signed)
[place] Coordinator follow up. [facility] eligible member screened for potential [facility] [employer] Care Management services as a benefit of member's insurance plan.  Verified in [place] member transitioned to home from [facility] and [place]. [name], SNF SW indicates member returned home with spouse and [place] home health.    Stephen Rolling, MSN, Stephen Wilkinson,Stephen Wilkinson [place] Acute Care Coordinator [phone] [facility]) [phone]  (Toll free office)

## 2021-12-05 DIAGNOSIS — E785 Hyperlipidemia, unspecified: Secondary | ICD-10-CM | POA: Diagnosis not present

## 2021-12-05 DIAGNOSIS — E890 Postprocedural hypothyroidism: Secondary | ICD-10-CM | POA: Diagnosis not present

## 2021-12-05 DIAGNOSIS — F419 Anxiety disorder, unspecified: Secondary | ICD-10-CM | POA: Diagnosis not present

## 2021-12-05 DIAGNOSIS — I152 Hypertension secondary to endocrine disorders: Secondary | ICD-10-CM | POA: Diagnosis not present

## 2021-12-05 DIAGNOSIS — G4700 Insomnia, unspecified: Secondary | ICD-10-CM | POA: Diagnosis not present

## 2021-12-05 DIAGNOSIS — Z7984 Long term (current) use of oral hypoglycemic drugs: Secondary | ICD-10-CM | POA: Diagnosis not present

## 2021-12-05 DIAGNOSIS — H3530 Unspecified macular degeneration: Secondary | ICD-10-CM | POA: Diagnosis not present

## 2021-12-05 DIAGNOSIS — Z96653 Presence of artificial knee joint, bilateral: Secondary | ICD-10-CM | POA: Diagnosis not present

## 2021-12-05 DIAGNOSIS — Z7901 Long term (current) use of anticoagulants: Secondary | ICD-10-CM | POA: Diagnosis not present

## 2021-12-05 DIAGNOSIS — Z993 Dependence on wheelchair: Secondary | ICD-10-CM | POA: Diagnosis not present

## 2021-12-05 DIAGNOSIS — I480 Paroxysmal atrial fibrillation: Secondary | ICD-10-CM | POA: Diagnosis not present

## 2021-12-05 DIAGNOSIS — I10 Essential (primary) hypertension: Secondary | ICD-10-CM | POA: Diagnosis not present

## 2021-12-05 DIAGNOSIS — N2889 Other specified disorders of kidney and ureter: Secondary | ICD-10-CM | POA: Diagnosis not present

## 2021-12-05 DIAGNOSIS — M1990 Unspecified osteoarthritis, unspecified site: Secondary | ICD-10-CM | POA: Diagnosis not present

## 2021-12-05 DIAGNOSIS — F32A Depression, unspecified: Secondary | ICD-10-CM | POA: Diagnosis not present

## 2021-12-05 DIAGNOSIS — M5136 Other intervertebral disc degeneration, lumbar region: Secondary | ICD-10-CM | POA: Diagnosis not present

## 2021-12-05 DIAGNOSIS — K219 Gastro-esophageal reflux disease without esophagitis: Secondary | ICD-10-CM | POA: Diagnosis not present

## 2021-12-05 DIAGNOSIS — H409 Unspecified glaucoma: Secondary | ICD-10-CM | POA: Diagnosis not present

## 2021-12-05 DIAGNOSIS — M4800 Spinal stenosis, site unspecified: Secondary | ICD-10-CM | POA: Diagnosis not present

## 2021-12-05 DIAGNOSIS — Z8673 Personal history of transient ischemic attack (TIA), and cerebral infarction without residual deficits: Secondary | ICD-10-CM | POA: Diagnosis not present

## 2021-12-05 DIAGNOSIS — S32038D Other fracture of third lumbar vertebra, subsequent encounter for fracture with routine healing: Secondary | ICD-10-CM | POA: Diagnosis not present

## 2021-12-05 DIAGNOSIS — E440 Moderate protein-calorie malnutrition: Secondary | ICD-10-CM | POA: Diagnosis not present

## 2021-12-05 DIAGNOSIS — H547 Unspecified visual loss: Secondary | ICD-10-CM | POA: Diagnosis not present

## 2021-12-05 DIAGNOSIS — E1159 Type 2 diabetes mellitus with other circulatory complications: Secondary | ICD-10-CM | POA: Diagnosis not present

## 2021-12-05 DIAGNOSIS — E1140 Type 2 diabetes mellitus with diabetic neuropathy, unspecified: Secondary | ICD-10-CM | POA: Diagnosis not present

## 2021-12-08 ENCOUNTER — Telehealth: Payer: Self-pay | Admitting: Internal Medicine

## 2021-12-08 NOTE — Telephone Encounter (Signed)
Ok for verbals 

## 2021-12-08 NOTE — Telephone Encounter (Signed)
Stephen Wilkinson from [facility] is requesting verbal orders for [facility]  Frequency: Once a week for 3 weeks, twice a week for 3 weeks, once a week for 1 week, skip a week, and then once a week for one week.   Callback: [phone] Ok to leave a message.

## 2021-12-09 DIAGNOSIS — M5136 Other intervertebral disc degeneration, lumbar region: Secondary | ICD-10-CM | POA: Diagnosis not present

## 2021-12-09 DIAGNOSIS — E1159 Type 2 diabetes mellitus with other circulatory complications: Secondary | ICD-10-CM | POA: Diagnosis not present

## 2021-12-09 DIAGNOSIS — E1140 Type 2 diabetes mellitus with diabetic neuropathy, unspecified: Secondary | ICD-10-CM | POA: Diagnosis not present

## 2021-12-09 DIAGNOSIS — I10 Essential (primary) hypertension: Secondary | ICD-10-CM | POA: Diagnosis not present

## 2021-12-09 DIAGNOSIS — S32038D Other fracture of third lumbar vertebra, subsequent encounter for fracture with routine healing: Secondary | ICD-10-CM | POA: Diagnosis not present

## 2021-12-09 DIAGNOSIS — I480 Paroxysmal atrial fibrillation: Secondary | ICD-10-CM | POA: Diagnosis not present

## 2021-12-15 ENCOUNTER — Other Ambulatory Visit: Payer: Self-pay

## 2021-12-15 ENCOUNTER — Ambulatory Visit (HOSPITAL_BASED_OUTPATIENT_CLINIC_OR_DEPARTMENT_OTHER): Payer: Medicare Other | Admitting: Anesthesiology

## 2021-12-15 ENCOUNTER — Encounter (HOSPITAL_COMMUNITY)
Admission: RE | Disposition: A | Discharge status: Home / Self Care | Payer: Self-pay | Source: Home / Self Care | Attending: Cardiology

## 2021-12-15 ENCOUNTER — Ambulatory Visit (HOSPITAL_COMMUNITY)
Admission: RE | Admit: 2021-12-15 | Discharge: 2021-12-15 | Disposition: A | Discharge status: Home / Self Care | Payer: Medicare Other | Attending: Cardiology | Admitting: Cardiology

## 2021-12-15 ENCOUNTER — Ambulatory Visit (HOSPITAL_COMMUNITY): Payer: Medicare Other | Admitting: Anesthesiology

## 2021-12-15 DIAGNOSIS — Z7984 Long term (current) use of oral hypoglycemic drugs: Secondary | ICD-10-CM | POA: Diagnosis not present

## 2021-12-15 DIAGNOSIS — E119 Type 2 diabetes mellitus without complications: Secondary | ICD-10-CM

## 2021-12-15 DIAGNOSIS — I10 Essential (primary) hypertension: Secondary | ICD-10-CM | POA: Insufficient documentation

## 2021-12-15 DIAGNOSIS — I4891 Unspecified atrial fibrillation: Secondary | ICD-10-CM

## 2021-12-15 DIAGNOSIS — E785 Hyperlipidemia, unspecified: Secondary | ICD-10-CM | POA: Insufficient documentation

## 2021-12-15 DIAGNOSIS — I4819 Other persistent atrial fibrillation: Secondary | ICD-10-CM | POA: Insufficient documentation

## 2021-12-15 DIAGNOSIS — I082 Rheumatic disorders of both aortic and tricuspid valves: Secondary | ICD-10-CM | POA: Insufficient documentation

## 2021-12-15 DIAGNOSIS — R296 Repeated falls: Secondary | ICD-10-CM | POA: Diagnosis not present

## 2021-12-15 DIAGNOSIS — Z79899 Other long term (current) drug therapy: Secondary | ICD-10-CM | POA: Diagnosis not present

## 2021-12-15 DIAGNOSIS — Z7901 Long term (current) use of anticoagulants: Secondary | ICD-10-CM | POA: Insufficient documentation

## 2021-12-15 DIAGNOSIS — Z8673 Personal history of transient ischemic attack (TIA), and cerebral infarction without residual deficits: Secondary | ICD-10-CM | POA: Insufficient documentation

## 2021-12-15 HISTORY — PX: CARDIOVERSION: SHX1299

## 2021-12-15 LAB — GLUCOSE, CAPILLARY: Glucose-Capillary: 129 mg/dL — ABNORMAL HIGH (ref 70–99)

## 2021-12-15 SURGERY — CARDIOVERSION
Anesthesia: General

## 2021-12-15 MED ORDER — PROPOFOL 10 MG/ML IV BOLUS
INTRAVENOUS | Status: DC | PRN
  Administered 2021-12-15: 60 mg via INTRAVENOUS

## 2021-12-15 MED ORDER — LIDOCAINE 2% (20 MG/ML) 5 ML SYRINGE
INTRAMUSCULAR | Status: DC | PRN
  Administered 2021-12-15: 20 mg via INTRAVENOUS

## 2021-12-15 MED ORDER — SODIUM CHLORIDE 0.9 % IV SOLN: INTRAVENOUS | Status: DC

## 2021-12-15 NOTE — Anesthesia Postprocedure Evaluation (Signed)
Anesthesia Post Note  Patient: Stephen Wilkinson  Procedure(s) Performed: CARDIOVERSION     Patient location during evaluation: PACU Anesthesia Type: General Level of consciousness: awake and alert Pain management: pain level controlled Vital Signs Assessment: post-procedure vital signs reviewed and stable Respiratory status: spontaneous breathing, nonlabored ventilation, respiratory function stable and patient connected to nasal cannula oxygen Cardiovascular status: blood pressure returned to baseline and stable Postop Assessment: no apparent nausea or vomiting Anesthetic complications: no   No notable events documented.  Last Vitals:  Vitals:   12/15/21 1120 12/15/21 1130  BP: 111/62 127/65  Pulse: (!) 57 (!) 58  Resp: 14 14  Temp:    SpO2: 95% 97%    Last Pain:  Vitals:   12/15/21 1130  TempSrc:   PainSc: 0-No pain                 Tiajuana Amass

## 2021-12-15 NOTE — Anesthesia Preprocedure Evaluation (Signed)
Anesthesia Evaluation  Patient identified by MRN, date of birth, ID band Patient awake    Reviewed: Allergy & Precautions, NPO status , Patient's Chart, lab work & pertinent test results  Airway Mallampati: II  TM Distance: >3 FB Neck ROM: Full    Dental  (+) Dental Advisory Given   Pulmonary neg pulmonary ROS,    breath sounds clear to auscultation       Cardiovascular hypertension, Pt. on medications and Pt. on home beta blockers + dysrhythmias  Rhythm:Regular Rate:Normal     Neuro/Psych  Neuromuscular disease CVA    GI/Hepatic Neg liver ROS, GERD  ,  Endo/Other  diabetes, Type 2, Oral Hypoglycemic AgentsHypothyroidism   Renal/GU negative Renal ROS     Musculoskeletal  (+) Arthritis ,   Abdominal   Peds  Hematology negative hematology ROS (+)   Anesthesia Other Findings   Reproductive/Obstetrics                             Anesthesia Physical Anesthesia Plan  ASA: 3  Anesthesia Plan: General   Post-op Pain Management: Minimal or no pain anticipated   Induction: Intravenous  PONV Risk Score and Plan: 2 and Treatment may vary due to age or medical condition  Airway Management Planned: Mask and Natural Airway  Additional Equipment:   Intra-op Plan:   Post-operative Plan:   Informed Consent: I have reviewed the patients History and Physical, chart, labs and discussed the procedure including the risks, benefits and alternatives for the proposed anesthesia with the patient or authorized representative who has indicated his/her understanding and acceptance.       Plan Discussed with:   Anesthesia Plan Comments:         Anesthesia Quick Evaluation

## 2021-12-15 NOTE — Transfer of Care (Signed)
Immediate Anesthesia Transfer of Care Note  Patient: Stephen Wilkinson  Procedure(s) Performed: CARDIOVERSION  Patient Location: PACU  Anesthesia Type:General  Level of Consciousness: drowsy  Airway & Oxygen Therapy: Patient Spontanous Breathing  Post-op Assessment: Report given to RN and Post -op Vital signs reviewed and stable  Post vital signs: Reviewed and stable  Last Vitals:  Vitals Value Taken Time  BP    Temp    Pulse    Resp    SpO2      Last Pain:  Vitals:   12/15/21 1005  TempSrc: Temporal  PainSc: 0-No pain         Complications: No notable events documented.

## 2021-12-15 NOTE — Discharge Instructions (Signed)
Electrical Cardioversion  Electrical cardioversion is the delivery of a jolt of electricity to restore a normal rhythm to the heart. A rhythm that is too fast or is not regular keeps the heart from pumping well. In this procedure, sticky patches or metal paddles are placed on the chest to deliver electricity to the heart from a device.  What can I expect after the procedure?  Your blood pressure, heart rate, breathing rate, and blood oxygen level will be monitored until you leave the hospital or clinic.  Your heart rhythm will be watched to make sure it does not change.  You may have some redness on the skin where the shocks were given.If this occurs, can use hydrocortisone cream or Aloe vera.  Follow these instructions at home:  Do not drive for 24 hours if you were given a sedative during your procedure.  Take over-the-counter and prescription medicines only as told by your health care provider.  Ask your health care provider how to check your pulse. Check it often.  Rest for 48 hours after the procedure or as told by your health care provider.  Avoid or limit your caffeine use as told by your health care provider.  Keep all follow-up visits as told by your health care provider. This is important.  Contact a health care provider if:  You feel like your heart is beating too quickly or your pulse is not regular.  You have a serious muscle cramp that does not go away.  Get help right away if:  You have discomfort in your chest.  You are dizzy or you feel faint.  You have trouble breathing or you are short of breath.  Your speech is slurred.  You have trouble moving an arm or leg on one side of your body.  Your fingers or toes turn cold or blue.  Summary  Electrical cardioversion is the delivery of a jolt of electricity to restore a normal rhythm to the heart.  This procedure may be done right away in an emergency or may be a scheduled procedure if the condition is not  an emergency.  Generally, this is a safe procedure.  After the procedure, check your pulse often as told by your health care provider.  This information is not intended to replace advice given to you by your health care provider. Make sure you discuss any questions you have with your health care provider. Document Revised: 01/02/2019 Document Reviewed: 01/02/2019 Elsevier Patient Education  2021 Elsevier Inc.  

## 2021-12-15 NOTE — Interval H&P Note (Signed)
History and Physical Interval Note:  12/15/2021 10:11 AM  Stephen Wilkinson  has presented today for surgery, with the diagnosis of Atrial Fibrillation.  The various methods of treatment have been discussed with the patient and family. After consideration of risks, benefits and other options for treatment, the patient has consented to  Procedure(s): CARDIOVERSION (N/A) as a surgical intervention.  The patient's history has been reviewed, patient examined, no change in status, stable for surgery.  I have reviewed the patient's chart and labs.  Questions were answered to the patient's satisfaction.     [employer]

## 2021-12-15 NOTE — CV Procedure (Signed)
    Electrical Cardioversion Procedure Note Dominion Kathan [id number] April 27, 1929  Procedure: Electrical Cardioversion Indications:  Atrial Fibrillation  Time Out: Verified patient identification, verified procedure,medications/allergies/relevent history reviewed, required imaging and test results available.  Performed  Procedure Details  The patient was NPO after midnight. Anesthesia was administered at the beside  by Dr.R Ola Spurr with propofol.  Cardioversion was performed with synchronized biphasic defibrillation via AP pads with 120 joules.  1 attempt(s) were performed.  The patient converted to normal sinus rhythm. The patient tolerated the procedure well   IMPRESSION:  Successful cardioversion of atrial fibrillation    Candee Furbish 12/15/2021, 11:02 AM

## 2021-12-16 ENCOUNTER — Encounter (HOSPITAL_COMMUNITY): Payer: Self-pay | Admitting: Cardiology

## 2021-12-18 ENCOUNTER — Telehealth: Payer: Self-pay | Admitting: Internal Medicine

## 2021-12-18 NOTE — Telephone Encounter (Signed)
Clare Gandy from [facility] called and stated pt missed physical therapy appt this week.    Fyi

## 2021-12-19 DIAGNOSIS — E1159 Type 2 diabetes mellitus with other circulatory complications: Secondary | ICD-10-CM | POA: Diagnosis not present

## 2021-12-19 DIAGNOSIS — I480 Paroxysmal atrial fibrillation: Secondary | ICD-10-CM | POA: Diagnosis not present

## 2021-12-19 DIAGNOSIS — S32038D Other fracture of third lumbar vertebra, subsequent encounter for fracture with routine healing: Secondary | ICD-10-CM | POA: Diagnosis not present

## 2021-12-19 DIAGNOSIS — M5136 Other intervertebral disc degeneration, lumbar region: Secondary | ICD-10-CM | POA: Diagnosis not present

## 2021-12-19 DIAGNOSIS — E1140 Type 2 diabetes mellitus with diabetic neuropathy, unspecified: Secondary | ICD-10-CM | POA: Diagnosis not present

## 2021-12-19 DIAGNOSIS — I10 Essential (primary) hypertension: Secondary | ICD-10-CM | POA: Diagnosis not present

## 2021-12-24 DIAGNOSIS — E1159 Type 2 diabetes mellitus with other circulatory complications: Secondary | ICD-10-CM | POA: Diagnosis not present

## 2021-12-24 DIAGNOSIS — M5136 Other intervertebral disc degeneration, lumbar region: Secondary | ICD-10-CM | POA: Diagnosis not present

## 2021-12-24 DIAGNOSIS — I480 Paroxysmal atrial fibrillation: Secondary | ICD-10-CM | POA: Diagnosis not present

## 2021-12-24 DIAGNOSIS — E1140 Type 2 diabetes mellitus with diabetic neuropathy, unspecified: Secondary | ICD-10-CM | POA: Diagnosis not present

## 2021-12-24 DIAGNOSIS — I10 Essential (primary) hypertension: Secondary | ICD-10-CM | POA: Diagnosis not present

## 2021-12-24 DIAGNOSIS — S32038D Other fracture of third lumbar vertebra, subsequent encounter for fracture with routine healing: Secondary | ICD-10-CM | POA: Diagnosis not present

## 2021-12-25 DIAGNOSIS — I480 Paroxysmal atrial fibrillation: Secondary | ICD-10-CM | POA: Diagnosis not present

## 2021-12-25 DIAGNOSIS — S32038D Other fracture of third lumbar vertebra, subsequent encounter for fracture with routine healing: Secondary | ICD-10-CM | POA: Diagnosis not present

## 2021-12-25 DIAGNOSIS — E1140 Type 2 diabetes mellitus with diabetic neuropathy, unspecified: Secondary | ICD-10-CM | POA: Diagnosis not present

## 2021-12-25 DIAGNOSIS — I10 Essential (primary) hypertension: Secondary | ICD-10-CM | POA: Diagnosis not present

## 2021-12-25 DIAGNOSIS — E1159 Type 2 diabetes mellitus with other circulatory complications: Secondary | ICD-10-CM | POA: Diagnosis not present

## 2021-12-25 DIAGNOSIS — M5136 Other intervertebral disc degeneration, lumbar region: Secondary | ICD-10-CM | POA: Diagnosis not present

## 2021-12-30 ENCOUNTER — Telehealth: Payer: Self-pay | Admitting: Internal Medicine

## 2021-12-30 DIAGNOSIS — E1159 Type 2 diabetes mellitus with other circulatory complications: Secondary | ICD-10-CM | POA: Diagnosis not present

## 2021-12-30 DIAGNOSIS — E1140 Type 2 diabetes mellitus with diabetic neuropathy, unspecified: Secondary | ICD-10-CM | POA: Diagnosis not present

## 2021-12-30 DIAGNOSIS — M5136 Other intervertebral disc degeneration, lumbar region: Secondary | ICD-10-CM | POA: Diagnosis not present

## 2021-12-30 DIAGNOSIS — I480 Paroxysmal atrial fibrillation: Secondary | ICD-10-CM | POA: Diagnosis not present

## 2021-12-30 DIAGNOSIS — I10 Essential (primary) hypertension: Secondary | ICD-10-CM | POA: Diagnosis not present

## 2021-12-30 DIAGNOSIS — S32038D Other fracture of third lumbar vertebra, subsequent encounter for fracture with routine healing: Secondary | ICD-10-CM | POA: Diagnosis not present

## 2021-12-30 NOTE — Telephone Encounter (Signed)
Clare Gandy from [facility] is requesting a callback. He would like to know if Agustine needs to be wearing the TLSO Back brace while they are doing standing activities? He stated Reznor has not worn the brace since being released from hospital.    Please advise   CB: [phone]

## 2021-12-30 NOTE — Telephone Encounter (Signed)
The back brace at this point would only be needed if doing so reduced his overall level of pain after his PT exercise, but I should think that he would not really need this since he has done so well after his recent surgury

## 2021-12-30 NOTE — Telephone Encounter (Signed)
Stephen Wilkinson with [facility] notified.

## 2021-12-30 NOTE — Progress Notes (Signed)
HPI: Follow-up hypertension and atrial fibrillation. Also history of TIA. Nuclear study March 2017 showed ejection fraction 62% and no ischemia.  Carotid Dopplers May 2020 showed 1 to 39% bilateral stenosis. Question episodes of syncope at previous ov. Monitor 4/21 showed sinus with PACs, PVCs brief PAT but no pauses.  Found to be in atrial fibrillation at office visit May 30, 2021.  Apixaban was initiated with plans for cardioversion if atrial fibrillation persisted as he did complain of increased fatigue. Echocardiogram repeated January 2023 and showed normal LV function, mild left ventricular hypertrophy, mild right ventricular enlargement, mild left atrial enlargement, mild to moderate tricuspid regurgitation, mildly dilated ascending aorta, moderate aortic stenosis with mean gradient 12 mmHg, aortic valve area 1.5 cm and dimensionless index 0.3.  TEE February 2023 showed no left atrial appendage thrombus, ejection fraction 55 to 60%, mild biatrial enlargement, mild mitral regurgitation and moderate aortic stenosis.  Patient had successful cardioversion to normal sinus rhythm.  Atrial fibrillation recurred.  Amiodarone initiated and he had repeat cardioversion December 15 2021.  Since last seen, following his last cardioversion he initially felt extremely well for 2 weeks with much more energy.  However in the past 1 to 2 weeks he notes decreased energy again.  There is no dyspnea, chest pain, palpitations, syncope or bleeding.  Current Outpatient Medications  Medication Sig Dispense Refill   amiodarone (PACERONE) 200 MG tablet Take 1 tablet (200 mg total) by mouth daily. 90 tablet 3   apixaban (ELIQUIS) 2.5 MG TABS tablet Take 1 tablet (2.5 mg total) by mouth 2 (two) times daily. 60 tablet    Bevacizumab (AVASTIN IV) Place 1 Syringe into the right eye See admin instructions. 1 dose injected in the right eye every 7 weeks     famotidine (PEPCID) 20 MG tablet Take 1 tablet (20 mg total) by mouth  2 (two) times daily. (Patient taking differently: Take 20 mg by mouth in the morning.) 180 tablet 3   glucose blood (ONE TOUCH ULTRA TEST) test strip Use as directed once daily to check blood sugar.  Diagnosis code E11.42 100 each 11   latanoprost (XALATAN) 0.005 % ophthalmic solution Place 1 drop into both eyes at bedtime. 2.5 mL 12   levothyroxine (SYNTHROID) 125 MCG tablet TAKE 1 TABLET EVERY DAY BEFORE BREAKFAST (Patient taking differently: Take 125 mcg by mouth daily before breakfast.) 90 tablet 3   lidocaine (LIDODERM) 5 % Place 1-3 patches onto the skin daily. Remove & Discard patch within 12 hours or as directed by MD 30 patch 0   melatonin 3 MG TABS tablet Take 1 tablet (3 mg total) by mouth at bedtime.  0   metFORMIN (GLUCOPHAGE) 1000 MG tablet TAKE 1 TABLET EVERY DAY (Patient taking differently: Take 1,000 mg by mouth daily with breakfast.) 90 tablet 0   metoprolol succinate (TOPROL-XL) 25 MG 24 hr tablet Take 1 tablet (25 mg total) by mouth every morning. 90 tablet 3   Multiple Vitamins-Minerals (ICAPS AREDS 2 PO) Take 2 capsules by mouth daily.     No current facility-administered medications for this visit.     Past Medical History:  Diagnosis Date   Anxiety    B12 deficiency 09/23/2016   Blood transfusion without reported diagnosis    Cancer ([place])    basal and squamous skin carcinoma    Cataract    Depression    Diabetes mellitus    GERD (gastroesophageal reflux disease)    Glaucoma 01/04/2013  Hx of transient ischemic attack (TIA) 01/04/2013   Hx: UTI (urinary tract infection)    Hyperlipidemia    Hypertension    Hypothyroidism    Insomnia    Macular degeneration    Neuropathy    OA (osteoarthritis)    Pneumonia    PVC's (premature ventricular contractions)    Renal mass    Stroke ([place])    mid 1990s     Past Surgical History:  Procedure Laterality Date   APPENDECTOMY     CARDIOVERSION N/A 07/30/2021   Procedure: CARDIOVERSION;  Surgeon: Josue Hector,  MD;  Location: [facility];  Service: Cardiovascular;  Laterality: N/A;   CARDIOVERSION N/A 12/15/2021   Procedure: CARDIOVERSION;  Surgeon: Jerline Pain, MD;  Location: [facility];  Service: Cardiovascular;  Laterality: N/A;   CATARACT EXTRACTION W/ INTRAOCULAR LENS  IMPLANT, BILATERAL     INGUINAL HERNIA REPAIR     X3   IR KYPHO LUMBAR INC FX REDUCE BONE BX UNI/BIL CANNULATION INC/IMAGING  11/04/2021   LUMBAR LAMINECTOMY/DECOMPRESSION MICRODISCECTOMY N/A 02/12/2017   Procedure: Lumbar Three-Four, Lumbar Four-Five, Lumbar Five-Sacral One Laminectomy and Foraminotomy;  Surgeon: Earnie Larsson, MD;  Location: [facility];  Service: Neurosurgery;  Laterality: N/A;   OTHER SURGICAL HISTORY     microwave procedure for prostate - 09/12    PROSTATE ABLATION     microwave   REPLACEMENT TOTAL KNEE     THYROIDECTOMY     TONSILLECTOMY AND ADENOIDECTOMY     TOTAL KNEE ARTHROPLASTY  05/11/2011   Procedure: TOTAL KNEE ARTHROPLASTY;  Surgeon: Gearlean Alf;  Location: [facility];  Service: Orthopedics;  Laterality: Right;   US ECHOCARDIOGRAPHY  11/10/2006   EF 55-60%   US ECHOCARDIOGRAPHY  09/20/2002   EF 65-70%    Social History   Socioeconomic History   Marital status: Married    Spouse name: Not on file   Number of children: 3   Years of education: Not on file   Highest education level: Not on file  Occupational History   Occupation: retired    Fish farm manager: RETIRED    Comment: [employer]  Tobacco Use   Smoking status: Never   Smokeless tobacco: Never  Vaping Use   Vaping Use: Never used  Substance and Sexual Activity   Alcohol use: Yes    Alcohol/week: 7.0 standard drinks of alcohol    Types: 7 Shots of liquor per week    Comment: 2 drinks most days   Drug use: No   Sexual activity: Never    Birth control/protection: None  Other Topics Concern   Not on file  Social History Narrative   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, 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[employer]      Home Situation: living with  wife      Spiritual Beliefs: episcopalian      Lifestyle: walks daily, working on diet            Social Determinants of Health   Financial Resource Strain: Low Risk  (09/10/2020)   Overall Financial Resource Strain (CARDIA)    Difficulty of Paying Living Expenses: Not hard at all  Food Insecurity: No Food Insecurity (09/10/2020)   Hunger Vital Sign    Worried About [place] in the Last Year: Never true    [place] in the Last Year: Never true  Transportation Needs: No Transportation Needs (09/10/2020)   PRAPARE - Hydrologist (Medical): No  Lack of Transportation (Non-Medical): No  Physical Activity: Inactive (09/10/2020)   Exercise Vital Sign    Days of Exercise per Week: 0 days    Minutes of Exercise per Session: 0 min  Stress: No Stress Concern Present (09/10/2020)   [place]    Feeling of Stress : Not at all  Social Connections: Not on file  Intimate Partner Violence: Not on file    Family History  Problem Relation Age of Onset   Hypertension Mother    Arthritis Mother    Diabetes Mother    Hypertension Father    Stroke Father    Kidney disease Father    Hypertension Brother    Diabetes Brother     ROS: no fevers or chills, productive cough, hemoptysis, dysphasia, odynophagia, melena, hematochezia, dysuria, hematuria, rash, seizure activity, orthopnea, PND, pedal edema, claudication. Remaining systems are negative.  Physical Exam: Well-developed well-nourished in no acute distress.  Skin is warm and dry.  HEENT is normal.  Neck is supple.  Chest is clear to auscultation with normal expansion.  Cardiovascular exam is irregular, 2/6 systolic murmur left sternal border.  S2 is not diminished. Abdominal exam nontender or distended. No masses palpated. Extremities show no edema. neuro grossly intact  ECG-atrial flutter, left axis deviation, septal  infarct.  Personally reviewed  A/P  1 paroxysmal atrial fibrillation-patient has developed recurrent atrial arrhythmias (is in atrial flutter today).  He is symptomatic with decreased energy.  Not sure that he would be a candidate for aggressive procedures such as ablation.  I have discussed the options and he would like to try to reestablish sinus.  I will increase amiodarone to 200 mg twice daily for 6 weeks then resume 200 mg daily.  Repeat cardioversion in 8 weeks.  I am hopeful that he will remain in sinus rhythm has more amiodarone is in his system.  If not we may need to continue with rate control with the understanding his symptoms may not improve.  We will recheck hemoglobin and renal function.  If his creatinine is less than 1.5 will increase apixaban to 5 mg twice daily.  I would ultimately like to discontinue apixaban if we can reestablish sinus rhythm as he has had falls in the past and the risk of anticoagulation is increased.    2 aortic stenosis-moderate on most recent echocardiogram.  He will require follow-up studies in the future and likely TAVR.  Repeat echocardiogram January 2024.  3 hypertension-blood pressure controlled.  Continue present medical regimen and follow.  4 history of TIA-continue apixaban for now.  5 hyperlipidemia-Per primary care  Kirk Ruths, MD

## 2021-12-31 ENCOUNTER — Telehealth: Payer: Self-pay | Admitting: Internal Medicine

## 2021-12-31 DIAGNOSIS — F419 Anxiety disorder, unspecified: Secondary | ICD-10-CM

## 2021-12-31 DIAGNOSIS — R269 Unspecified abnormalities of gait and mobility: Secondary | ICD-10-CM

## 2021-12-31 DIAGNOSIS — M545 Low back pain, unspecified: Secondary | ICD-10-CM

## 2021-12-31 NOTE — Telephone Encounter (Signed)
Spouse requested [facility].  Stated [facility] is not welcomed in pt. home,pt is very upset.  Please Advise

## 2022-01-01 NOTE — Telephone Encounter (Signed)
Ok I changed the order

## 2022-01-12 ENCOUNTER — Ambulatory Visit (INDEPENDENT_AMBULATORY_CARE_PROVIDER_SITE_OTHER): Payer: Medicare Other | Admitting: Cardiology

## 2022-01-12 ENCOUNTER — Encounter: Payer: Self-pay | Admitting: Cardiology

## 2022-01-12 VITALS — BP 140/90 | HR 73 | Ht 73.0 in | Wt 166.0 lb

## 2022-01-12 DIAGNOSIS — I10 Essential (primary) hypertension: Secondary | ICD-10-CM

## 2022-01-12 DIAGNOSIS — I350 Nonrheumatic aortic (valve) stenosis: Secondary | ICD-10-CM | POA: Diagnosis not present

## 2022-01-12 DIAGNOSIS — E7800 Pure hypercholesterolemia, unspecified: Secondary | ICD-10-CM | POA: Diagnosis not present

## 2022-01-12 DIAGNOSIS — I4819 Other persistent atrial fibrillation: Secondary | ICD-10-CM

## 2022-01-12 MED ORDER — AMIODARONE HCL 200 MG PO TABS: ORAL_TABLET | ORAL | 0 refills | Status: DC

## 2022-01-12 NOTE — Patient Instructions (Signed)
Medication Instructions:   INCREASE AMIODARONE TO 200 MG TWICE DAILY UNTIL 02/21/22 AND THEN TAKE 1 TABLET ONCE DAILY  *If you need a refill on your cardiac medications before your next appointment, please call your pharmacy*   Testing/Procedures:   You are scheduled for a  Cardioversion on 03/10/22 with Dr. Stanford Breed.  Please arrive at the [facility] (Main Entrance A) at [facility]: [address] [place], [place] [address] at 10 am. (1 hour prior to procedure unless lab work is needed; if lab work is needed arrive 1.5 hours ahead)  DIET: Nothing to eat or drink after midnight except a sip of water with medications (see medication instructions below)  FYI: For your safety, and to allow [place] to monitor your vital signs accurately during the surgery/procedure we request that   if you have artificial nails, gel coating, SNS etc. Please have those removed prior to your surgery/procedure. Not having the nail coverings /polish removed may result in cancellation or delay of your surgery/procedure.   Medication Instructions: DO NOT TAKE METOPROLOL THE MORNING OF THE PROCEDURE  Continue your anticoagulant: ELIQUIS  You must have a responsible person to drive you home and stay in the waiting area during your procedure. Failure to do so could result in cancellation.  Bring your insurance cards.  *Special Note: Every effort is made to have your procedure done on time. Occasionally there are emergencies that occur at the hospital that may cause delays. Please be patient if a delay does occur.     Follow-Up: At [facility], you and your health needs are our priority.  As part of our continuing mission to provide you with exceptional heart care, we have created designated Provider Care Teams.  These Care Teams include your primary Cardiologist (physician) and Advanced Practice Providers (APPs -  Physician Assistants and Nurse Practitioners) who all work together to provide you with the care  you need, when you need it.  We recommend signing up for the patient portal called "MyChart".  Sign up information is provided on this After Visit Summary.  MyChart is used to connect with patients for Virtual Visits (Telemedicine).  Patients are able to view lab/test results, encounter notes, upcoming appointments, etc.  Non-urgent messages can be sent to your provider as well.   To learn more about what you can do with MyChart, go to [email or web address].    Your next appointment:   3 month(s)  The format for your next appointment:   In Person  Provider:   Kirk Ruths, MD       Important Information About Sugar

## 2022-01-13 ENCOUNTER — Telehealth: Payer: Self-pay | Admitting: *Deleted

## 2022-01-13 DIAGNOSIS — I4819 Other persistent atrial fibrillation: Secondary | ICD-10-CM

## 2022-01-13 DIAGNOSIS — E039 Hypothyroidism, unspecified: Secondary | ICD-10-CM

## 2022-01-13 LAB — COMPREHENSIVE METABOLIC PANEL
ALT: 7 IU/L (ref 0–44)
AST: 14 IU/L (ref 0–40)
Albumin/Globulin Ratio: 1.8 (ref 1.2–2.2)
Albumin: 4.6 g/dL (ref 3.6–4.6)
Alkaline Phosphatase: 104 IU/L (ref 44–121)
BUN/Creatinine Ratio: 17 (ref 10–24)
BUN: 25 mg/dL (ref 10–36)
Bilirubin Total: 0.6 mg/dL (ref 0.0–1.2)
CO2: 22 mmol/L (ref 20–29)
Calcium: 9.9 mg/dL (ref 8.6–10.2)
Chloride: 100 mmol/L (ref 96–106)
Creatinine, Ser: 1.5 mg/dL — ABNORMAL HIGH (ref 0.76–1.27)
Globulin, Total: 2.6 g/dL (ref 1.5–4.5)
Glucose: 113 mg/dL — ABNORMAL HIGH (ref 70–99)
Potassium: 5.2 mmol/L (ref 3.5–5.2)
Sodium: 139 mmol/L (ref 134–144)
Total Protein: 7.2 g/dL (ref 6.0–8.5)
eGFR: 44 mL/min/{1.73_m2} — ABNORMAL LOW (ref >59)

## 2022-01-13 LAB — CBC
Hematocrit: 43.9 % (ref 37.5–51.0)
Hemoglobin: 14.9 g/dL (ref 13.0–17.7)
MCH: 32.5 pg (ref 26.6–33.0)
MCHC: 33.9 g/dL (ref 31.5–35.7)
MCV: 96 fL (ref 79–97)
Platelets: 232 10*3/uL (ref 150–450)
RBC: 4.58 x10E6/uL (ref 4.14–5.80)
RDW: 13.7 % (ref 11.6–15.4)
WBC: 8.3 10*3/uL (ref 3.4–10.8)

## 2022-01-13 LAB — TSH: TSH: 4.93 u[IU]/mL — ABNORMAL HIGH (ref 0.450–4.500)

## 2022-01-13 MED ORDER — APIXABAN 5 MG PO TABS: 5.0000 mg | ORAL_TABLET | Freq: Two times a day (BID) | ORAL | Status: DC

## 2022-01-13 NOTE — Telephone Encounter (Signed)
-----   Message from [name], MD sent at 01/13/2022  7:27 AM EDT ----- Cr borderline for dosing; given upcoming DCCV, would increase apixaban to 5 mg BID; repeat TSH and free T4 12 weeks Kirk Ruths

## 2022-01-14 ENCOUNTER — Ambulatory Visit (INDEPENDENT_AMBULATORY_CARE_PROVIDER_SITE_OTHER): Payer: Medicare Other | Admitting: Ophthalmology

## 2022-01-14 ENCOUNTER — Encounter (INDEPENDENT_AMBULATORY_CARE_PROVIDER_SITE_OTHER): Payer: Self-pay | Admitting: Ophthalmology

## 2022-01-14 DIAGNOSIS — H353113 Nonexudative age-related macular degeneration, right eye, advanced atrophic without subfoveal involvement: Secondary | ICD-10-CM

## 2022-01-14 DIAGNOSIS — H35372 Puckering of macula, left eye: Secondary | ICD-10-CM | POA: Diagnosis not present

## 2022-01-14 DIAGNOSIS — H353211 Exudative age-related macular degeneration, right eye, with active choroidal neovascularization: Secondary | ICD-10-CM

## 2022-01-14 DIAGNOSIS — H35371 Puckering of macula, right eye: Secondary | ICD-10-CM | POA: Diagnosis not present

## 2022-01-14 MED ORDER — BEVACIZUMAB CHEMO INJECTION 1.25MG/0.05ML SYRINGE FOR KALEIDOSCOPE
1.2500 mg | INTRAVITREAL | Status: AC | PRN
  Administered 2022-01-14: 1.25 mg via INTRAVITREAL

## 2022-01-14 NOTE — Progress Notes (Signed)
01/14/2022     CHIEF COMPLAINT Patient presents for  Chief Complaint  Patient presents with   Macular Degeneration      HISTORY OF PRESENT ILLNESS: Stephen Wilkinson is a 88 y.o. male who presents to the clinic today for:   HPI   8 weeks for DILATE OD, AVASTIN OCT. Pt stated vision has gotten worse. Pt reports vision changes started about a month ago and noticed vision has become fuzzy. Pt would like to see Dr. Ellie Lunch for glasses.  Last edited by Silvestre Moment on 01/14/2022 10:27 AM.      Referring physician: Luberta Mutter, MD [address] [place],  [place] [address]  HISTORICAL INFORMATION:   Selected notes from the MEDICAL RECORD NUMBER    Lab Results  Component Value Date   HGBA1C 7.4 (H) 08/27/2021     CURRENT MEDICATIONS: Current Outpatient Medications (Ophthalmic Drugs)  Medication Sig   latanoprost (XALATAN) 0.005 % ophthalmic solution Place 1 drop into both eyes at bedtime.   No current facility-administered medications for this visit. (Ophthalmic Drugs)   Current Outpatient Medications (Other)  Medication Sig   amiodarone (PACERONE) 200 MG tablet Take 1 tablet (200 mg total) by mouth daily.   amiodarone (PACERONE) 200 MG tablet 1 tablet twice daily x 6 weeks   apixaban (ELIQUIS) 5 MG TABS tablet Take 1 tablet (5 mg total) by mouth 2 (two) times daily.   Bevacizumab (AVASTIN IV) Place 1 Syringe into the right eye See admin instructions. 1 dose injected in the right eye every 7 weeks   famotidine (PEPCID) 20 MG tablet Take 1 tablet (20 mg total) by mouth 2 (two) times daily. (Patient taking differently: Take 20 mg by mouth in the morning.)   glucose blood (ONE TOUCH ULTRA TEST) test strip Use as directed once daily to check blood sugar.  Diagnosis code E11.42   levothyroxine (SYNTHROID) 125 MCG tablet TAKE 1 TABLET EVERY DAY BEFORE BREAKFAST (Patient taking differently: Take 125 mcg by mouth daily before breakfast.)   lidocaine (LIDODERM) 5 % Place 1-3 patches onto  the skin daily. Remove & Discard patch within 12 hours or as directed by MD   melatonin 3 MG TABS tablet Take 1 tablet (3 mg total) by mouth at bedtime.   metFORMIN (GLUCOPHAGE) 1000 MG tablet TAKE 1 TABLET EVERY DAY (Patient taking differently: Take 1,000 mg by mouth daily with breakfast.)   metoprolol succinate (TOPROL-XL) 25 MG 24 hr tablet Take 1 tablet (25 mg total) by mouth every morning.   Multiple Vitamins-Minerals (ICAPS AREDS 2 PO) Take 2 capsules by mouth daily.   No current facility-administered medications for this visit. (Other)      REVIEW OF SYSTEMS: ROS   Negative for: Constitutional, Gastrointestinal, Neurological, Skin, Genitourinary, Musculoskeletal, HENT, Endocrine, Cardiovascular, Eyes, Respiratory, Psychiatric, Allergic/Imm, Heme/Lymph Last edited by Silvestre Moment on 01/14/2022 10:14 AM.       ALLERGIES No Known Allergies  PAST MEDICAL HISTORY Past Medical History:  Diagnosis Date   Anxiety    B12 deficiency 09/23/2016   Blood transfusion without reported diagnosis    Cancer ([place])    basal and squamous skin carcinoma    Cataract    Depression    Diabetes mellitus    GERD (gastroesophageal reflux disease)    Glaucoma 01/04/2013   Hx of transient ischemic attack (TIA) 01/04/2013   Hx: UTI (urinary tract infection)    Hyperlipidemia    Hypertension    Hypothyroidism    Insomnia  Macular degeneration    Neuropathy    OA (osteoarthritis)    Pneumonia    PVC's (premature ventricular contractions)    Renal mass    Stroke ([place])    mid 1990s    Past Surgical History:  Procedure Laterality Date   APPENDECTOMY     CARDIOVERSION N/A 07/30/2021   Procedure: CARDIOVERSION;  Surgeon: Josue Hector, MD;  Location: [facility];  Service: Cardiovascular;  Laterality: N/A;   CARDIOVERSION N/A 12/15/2021   Procedure: CARDIOVERSION;  Surgeon: Jerline Pain, MD;  Location: [facility];  Service: Cardiovascular;  Laterality: N/A;   CATARACT EXTRACTION W/  INTRAOCULAR LENS  IMPLANT, BILATERAL     INGUINAL HERNIA REPAIR     X3   IR KYPHO LUMBAR INC FX REDUCE BONE BX UNI/BIL CANNULATION INC/IMAGING  11/04/2021   LUMBAR LAMINECTOMY/DECOMPRESSION MICRODISCECTOMY N/A 02/12/2017   Procedure: Lumbar Three-Four, Lumbar Four-Five, Lumbar Five-Sacral One Laminectomy and Foraminotomy;  Surgeon: Earnie Larsson, MD;  Location: [facility];  Service: Neurosurgery;  Laterality: N/A;   OTHER SURGICAL HISTORY     microwave procedure for prostate - 09/12    PROSTATE ABLATION     microwave   REPLACEMENT TOTAL KNEE     THYROIDECTOMY     TONSILLECTOMY AND ADENOIDECTOMY     TOTAL KNEE ARTHROPLASTY  05/11/2011   Procedure: TOTAL KNEE ARTHROPLASTY;  Surgeon: Gearlean Alf;  Location: [facility];  Service: Orthopedics;  Laterality: Right;   US ECHOCARDIOGRAPHY  11/10/2006   EF 55-60%   US ECHOCARDIOGRAPHY  09/20/2002   EF 65-70%    FAMILY HISTORY Family History  Problem Relation Age of Onset   Hypertension Mother    Arthritis Mother    Diabetes Mother    Hypertension Father    Stroke Father    Kidney disease Father    Hypertension Brother    Diabetes Brother     SOCIAL HISTORY Social History   Tobacco Use   Smoking status: Never   Smokeless tobacco: Never  Vaping Use   Vaping Use: Never used  Substance Use Topics   Alcohol use: Yes    Alcohol/week: 7.0 standard drinks of alcohol    Types: 7 Shots of liquor per week    Comment: 2 drinks most days   Drug use: No         OPHTHALMIC EXAM:  Base Eye Exam     Visual Acuity (ETDRS)       Right Left   Dist [place] 20/60 -2 CF at face   Dist ph [place] NI          Tonometry (Tonopen, 10:19 AM)       Right Left   Pressure 17 16         Pupils       Pupils APD   Right PERRL None   Left PERRL None         Visual Fields       Left Right     Full   Restrictions Partial inner superior temporal, inferior temporal, superior nasal, inferior nasal deficiencies          Extraocular Movement        Right Left    Full, Ortho Full, Ortho         Neuro/Psych     Oriented x3: Yes   Mood/Affect: Normal         Dilation     Right eye: 2.5% Phenylephrine, 1.0% Mydriacyl @ 10:19 AM  Slit Lamp and Fundus Exam     External Exam       Right Left   External Normal Normal         Slit Lamp Exam       Right Left   Lids/Lashes Normal Normal   Conjunctiva/Sclera White and quiet White and quiet   Cornea Clear Clear   Anterior Chamber Deep and quiet Deep and quiet   Iris Round and reactive Round and reactive   Lens Posterior chamber intraocular lens Posterior chamber intraocular lens   Anterior Vitreous Normal Normal         Fundus Exam       Right Left   Posterior Vitreous Normal    Disc Normal    C/D Ratio 0.35    Macula Advanced age related macular degeneration, Geographic atrophy, Hard drusen, no exudates, no clinical Subretinal neovascular membrane, no hemorrhage, Retinal pigment epithelial mottling, Retinal pigment epithelial atrophy, small hemorrhage on nonfoveal side of atrophy, temporally but not larger    Vessels Normal    Periphery Normal             IMAGING AND PROCEDURES  Imaging and Procedures for 01/14/22  OCT, Retina - OU - Both Eyes       Right Eye Quality was good. Scan locations included subfoveal. Central Foveal Thickness: 230. Progression has improved. Findings include abnormal foveal contour, inner retinal atrophy, outer retinal atrophy.   Left Eye Quality was good. Scan locations included subfoveal. Central Foveal Thickness: 273. Progression has been stable. Findings include epiretinal membrane, inner retinal atrophy, outer retinal atrophy.   Notes OS with minor epiretinal membrane nasal aspect of fovea yet with atrophy no or intervention, with significant subfoveal geographic atrophy and scarring from prior CNVM And dry AMD   OD, no sign of recurrence of CNVM 8-week interval today, stable overall no signs of  intraretinal fluid or subretinal fluid      Intravitreal Injection, Pharmacologic Agent - OD - Right Eye       Time Out 01/14/2022. 10:42 AM. Confirmed correct patient, procedure, site, and patient consented.   Anesthesia Topical anesthesia was used. Anesthetic medications included Lidocaine 4%.   Procedure Preparation included 5% betadine to ocular surface, 10% betadine to eyelids, Tobramycin 0.3%. A 30 gauge needle was used.   Injection: 1.25 mg Bevacizumab 1.'25mg'$ /0.19m   Route: Intravitreal, Site: Right Eye   NDC: [id number]  Post-op Post injection exam found visual acuity of at least counting fingers. The patient tolerated the procedure well. There were no complications. The patient received written and verbal post procedure care education. Post injection medications included ocuflox.              ASSESSMENT/PLAN:  Advanced nonexudative age-related macular degeneration of right eye without subfoveal involvement OD today at the 8-week follow-up doing well.  No sign of recurrence.  Progressive atrophy is notable which may account for acuity  Exudative age-related macular degeneration of right eye with active choroidal neovascularization (HCC) No sign of CNVM recurrence today at 8-week interval follow-up post Avastin OD.  Mild history of multiple recurrences.  Atrophy is present.  See dry AMD notions above  Epiretinal membrane, left eye Minor no change over time residual ILM striae no impact on acuity  Epiretinal membrane, right eye Minor macular findings OCT and clinically no impact on acuity     ICD-10-CM   1. Exudative age-related macular degeneration of right eye with active choroidal neovascularization [place]  H35.3211 OCT, Retina -  OU - Both Eyes    Intravitreal Injection, Pharmacologic Agent - OD - Right Eye    Bevacizumab (AVASTIN) SOLN 1.25 mg    2. Advanced nonexudative age-related macular degeneration of right eye without subfoveal involvement  H35.3113      3. Epiretinal membrane, left eye  H35.372     4. Epiretinal membrane, right eye  H35.371       1.  OD doing well no sign of recurrence of CNVM and currently at 8-week interval.  We will repeat injection today to maintain reevaluate due to scheduling issues in 7 weeks.  Likely injection Avastin at that time  2.  Dilate OU next  3.  I explained the patient that dry atrophic AMD is progressing in the right eye as well which could account for some of his visual difficulties made worse by less perfusion to the macula and choroid due to ongoing persistent atrial fibrillation and concomitant less blood/oxygen flow to the macula  4.  Follow-up with Dr. Ellie Lunch of [facility] so as to maximize visual potential  Ophthalmic Meds Ordered this visit:  Meds ordered this encounter  Medications   Bevacizumab (AVASTIN) SOLN 1.25 mg       Return in about 7 weeks (around 03/04/2022) for DILATE OU, AVASTIN OCT, OD.  There are no Patient Instructions on file for this visit.   Explained the diagnoses, plan, and follow up with the patient and they expressed understanding.  Patient expressed understanding of the importance of proper follow up care.   Clent Demark Freedom Peddy M.D. Diseases & Surgery of the Retina and Vitreous [facility] [place] 01/14/22     Abbreviations: M myopia (nearsighted); A astigmatism; H hyperopia (farsighted); P presbyopia; Mrx spectacle prescription;  CTL contact lenses; OD right eye; OS left eye; OU both eyes  XT exotropia; ET esotropia; PEK punctate epithelial keratitis; PEE punctate epithelial erosions; DES dry eye syndrome; MGD meibomian gland dysfunction; ATs artificial tears; PFAT's preservative free artificial tears; [place] nuclear sclerotic cataract; PSC posterior subcapsular cataract; ERM epi-retinal membrane; PVD posterior vitreous detachment; RD retinal detachment; DM diabetes mellitus; DR diabetic retinopathy; NPDR non-proliferative  diabetic retinopathy; PDR proliferative diabetic retinopathy; CSME clinically significant macular edema; DME diabetic macular edema; dbh dot blot hemorrhages; CWS cotton wool spot; POAG primary open angle glaucoma; C/D cup-to-disc ratio; HVF humphrey visual field; GVF goldmann visual field; OCT optical coherence tomography; IOP intraocular pressure; BRVO Branch retinal vein occlusion; CRVO central retinal vein occlusion; CRAO central retinal artery occlusion; BRAO branch retinal artery occlusion; RT retinal tear; SB scleral buckle; PPV pars plana vitrectomy; VH Vitreous hemorrhage; PRP panretinal laser photocoagulation; IVK intravitreal kenalog; VMT vitreomacular traction; MH Macular hole;  NVD neovascularization of the disc; NVE neovascularization elsewhere; AREDS age related eye disease study; ARMD age related macular degeneration; POAG primary open angle glaucoma; EBMD epithelial/anterior basement membrane dystrophy; ACIOL anterior chamber intraocular lens; IOL intraocular lens; PCIOL posterior chamber intraocular lens; Phaco/IOL phacoemulsification with intraocular lens placement; [place] photorefractive keratectomy; LASIK laser assisted in situ keratomileusis; HTN hypertension; DM diabetes mellitus; COPD chronic obstructive pulmonary disease

## 2022-01-14 NOTE — Assessment & Plan Note (Signed)
OD today at the 8-week follow-up doing well.  No sign of recurrence.  Progressive atrophy is notable which may account for acuity

## 2022-01-14 NOTE — Assessment & Plan Note (Signed)
No sign of CNVM recurrence today at 8-week interval follow-up post Avastin OD.  Mild history of multiple recurrences.  Atrophy is present.  See dry AMD notions above

## 2022-01-14 NOTE — Assessment & Plan Note (Signed)
Minor macular findings OCT and clinically no impact on acuity

## 2022-01-14 NOTE — Assessment & Plan Note (Signed)
Minor no change over time residual ILM striae no impact on acuity

## 2022-01-16 ENCOUNTER — Telehealth: Payer: Self-pay | Admitting: Internal Medicine

## 2022-01-16 NOTE — Telephone Encounter (Signed)
Charisse called to advise that the pt does not want Home Health Physical Therapy right now. Charisse spoke with pt wife and she stated the pt is sick and has heart issues so he doesn't want the physical therapy at this time.   Fyi

## 2022-01-17 ENCOUNTER — Ambulatory Visit: Payer: Medicare Other | Admitting: Cardiology

## 2022-01-22 ENCOUNTER — Ambulatory Visit (INDEPENDENT_AMBULATORY_CARE_PROVIDER_SITE_OTHER): Payer: Medicare Other | Admitting: Podiatry

## 2022-01-22 DIAGNOSIS — Z7901 Long term (current) use of anticoagulants: Secondary | ICD-10-CM | POA: Diagnosis not present

## 2022-01-22 DIAGNOSIS — M79676 Pain in unspecified toe(s): Secondary | ICD-10-CM | POA: Diagnosis not present

## 2022-01-22 DIAGNOSIS — L84 Corns and callosities: Secondary | ICD-10-CM | POA: Diagnosis not present

## 2022-01-22 DIAGNOSIS — E1140 Type 2 diabetes mellitus with diabetic neuropathy, unspecified: Secondary | ICD-10-CM

## 2022-01-22 DIAGNOSIS — B351 Tinea unguium: Secondary | ICD-10-CM | POA: Diagnosis not present

## 2022-01-22 NOTE — Progress Notes (Signed)
Subjective: 88 y.o. returns the office today for painful, elongated, thickened toenails which he cannot trim himself. Denies any redness or drainage around the nails.  No other lower extremity complaints today.  On Eliquis  PCP: Biagio Borg, MD A1c: 7.4 on November 28, 2021  Objective: NAD DP/PT pulses palpable, CRT less than 3 seconds Nails hypertrophic, dystrophic, elongated, brittle, discolored 10. There is tenderness overlying the nails 1-5 bilaterally. There is no surrounding erythema or drainage along the nail sites. Hyperkeratotic lesions noted along the right heel, right hallux without any underlying ulceration drainage or signs of infection.  No open lesions. Dry skin No open lesions or pre-ulcerative lesions are identified. No pain with calf compression, swelling, warmth, erythema.  Assessment: Patient presents with symptomatic onychomycosis  Plan: -Treatment options including alternatives, risks, complications were discussed -Nails sharply debrided 10 without complication/bleeding. -Hyperkeratotic lesion sharp debrided x2 without any complications or bleeding. -Recommend moisturizer daily but do not apply interdigitally. -Discussed daily foot inspection. If there are any changes, to call the office immediately.  -Follow-up in 3 months or sooner if any problems are to arise. In the meantime, encouraged to call the office with any questions, concerns, changes symptoms.  Celesta Gentile, DPM

## 2022-02-18 ENCOUNTER — Ambulatory Visit: Payer: Medicare Other | Admitting: Cardiology

## 2022-03-04 ENCOUNTER — Encounter (INDEPENDENT_AMBULATORY_CARE_PROVIDER_SITE_OTHER): Payer: Medicare Other | Admitting: Ophthalmology

## 2022-03-04 ENCOUNTER — Encounter (INDEPENDENT_AMBULATORY_CARE_PROVIDER_SITE_OTHER): Payer: Self-pay | Admitting: Ophthalmology

## 2022-03-04 ENCOUNTER — Ambulatory Visit (INDEPENDENT_AMBULATORY_CARE_PROVIDER_SITE_OTHER): Payer: Medicare Other | Admitting: Ophthalmology

## 2022-03-04 DIAGNOSIS — H35371 Puckering of macula, right eye: Secondary | ICD-10-CM

## 2022-03-04 DIAGNOSIS — H353211 Exudative age-related macular degeneration, right eye, with active choroidal neovascularization: Secondary | ICD-10-CM

## 2022-03-04 DIAGNOSIS — H353124 Nonexudative age-related macular degeneration, left eye, advanced atrophic with subfoveal involvement: Secondary | ICD-10-CM

## 2022-03-04 MED ORDER — BEVACIZUMAB CHEMO INJECTION 1.25MG/0.05ML SYRINGE FOR KALEIDOSCOPE
2.5000 mg | INTRAVITREAL | Status: AC | PRN
  Administered 2022-03-04: 2.5 mg via INTRAVITREAL

## 2022-03-04 NOTE — Assessment & Plan Note (Signed)
Accounts for acuity 

## 2022-03-04 NOTE — Assessment & Plan Note (Signed)
OD doing very well post injection Avastin at 7-week interval.  Long chronic history of recurrences.  Maintain 7 to 8-week interval and repeat injection today

## 2022-03-04 NOTE — Assessment & Plan Note (Signed)
Minor OD

## 2022-03-04 NOTE — Progress Notes (Signed)
03/04/2022     CHIEF COMPLAINT Patient presents for  Chief Complaint  Patient presents with   Macular Degeneration      HISTORY OF PRESENT ILLNESS: Stephen Wilkinson is a 88 y.o. male who presents to the clinic today for:   HPI   Age related macular degeneration of right eye 7 weeks dilate ou avastin oct od Pt states his vision has not been stable Pt denies any new floaters or FOL Pt states his right eye vision is blurry Last edited by Morene Rankins, CMA on 03/04/2022 10:30 AM.      Referring physician: Biagio Borg, MD [place],  [place] [address]  HISTORICAL INFORMATION:   Selected notes from the MEDICAL RECORD NUMBER    Lab Results  Component Value Date   HGBA1C 7.4 (H) 08/27/2021     CURRENT MEDICATIONS: No current outpatient medications on file. (Ophthalmic Drugs)   No current facility-administered medications for this visit. (Ophthalmic Drugs)   Current Outpatient Medications (Other)  Medication Sig   amiodarone (PACERONE) 200 MG tablet Take 1 tablet (200 mg total) by mouth daily.   amiodarone (PACERONE) 200 MG tablet 1 tablet twice daily x 6 weeks   apixaban (ELIQUIS) 5 MG TABS tablet Take 1 tablet (5 mg total) by mouth 2 (two) times daily.   Bevacizumab (AVASTIN IV) Place 1 Syringe into the right eye See admin instructions. 1 dose injected in the right eye every 7 weeks   famotidine (PEPCID) 20 MG tablet Take 1 tablet (20 mg total) by mouth 2 (two) times daily. (Patient taking differently: Take 20 mg by mouth in the morning.)   glucose blood (ONE TOUCH ULTRA TEST) test strip Use as directed once daily to check blood sugar.  Diagnosis code E11.42   levothyroxine (SYNTHROID) 125 MCG tablet TAKE 1 TABLET EVERY DAY BEFORE BREAKFAST (Patient taking differently: Take 125 mcg by mouth daily before breakfast.)   lidocaine (LIDODERM) 5 % Place 1-3 patches onto the skin daily. Remove & Discard patch within 12 hours or as directed by MD   melatonin 3 MG  TABS tablet Take 1 tablet (3 mg total) by mouth at bedtime.   metFORMIN (GLUCOPHAGE) 1000 MG tablet TAKE 1 TABLET EVERY DAY (Patient taking differently: Take 1,000 mg by mouth daily with breakfast.)   metoprolol succinate (TOPROL-XL) 25 MG 24 hr tablet Take 1 tablet (25 mg total) by mouth every morning.   Multiple Vitamins-Minerals (ICAPS AREDS 2 PO) Take 2 capsules by mouth daily.   No current facility-administered medications for this visit. (Other)      REVIEW OF SYSTEMS: ROS   Negative for: Constitutional, Gastrointestinal, Neurological, Skin, Genitourinary, Musculoskeletal, HENT, Endocrine, Cardiovascular, Eyes, Respiratory, Psychiatric, Allergic/Imm, Heme/Lymph Last edited by Orene Desanctis D, CMA on 03/04/2022 10:30 AM.       ALLERGIES No Known Allergies  PAST MEDICAL HISTORY Past Medical History:  Diagnosis Date   Anxiety    B12 deficiency 09/23/2016   Blood transfusion without reported diagnosis    Cancer ([place])    basal and squamous skin carcinoma    Cataract    Depression    Diabetes mellitus    GERD (gastroesophageal reflux disease)    Glaucoma 01/04/2013   Hx of transient ischemic attack (TIA) 01/04/2013   Hx: UTI (urinary tract infection)    Hyperlipidemia    Hypertension    Hypothyroidism    Insomnia    Macular degeneration    Neuropathy    OA (osteoarthritis)  Pneumonia    PVC's (premature ventricular contractions)    Renal mass    Stroke ([place])    mid 1990s    Past Surgical History:  Procedure Laterality Date   APPENDECTOMY     CARDIOVERSION N/A 07/30/2021   Procedure: CARDIOVERSION;  Surgeon: Josue Hector, MD;  Location: [facility];  Service: Cardiovascular;  Laterality: N/A;   CARDIOVERSION N/A 12/15/2021   Procedure: CARDIOVERSION;  Surgeon: Jerline Pain, MD;  [facility];  Service: Cardiovascular;  Laterality: N/A;   CATARACT EXTRACTION W/ INTRAOCULAR LENS  IMPLANT, BILATERAL     INGUINAL HERNIA REPAIR     X3   IR KYPHO  LUMBAR INC FX REDUCE BONE BX UNI/BIL CANNULATION INC/IMAGING  11/04/2021   LUMBAR LAMINECTOMY/DECOMPRESSION MICRODISCECTOMY N/A 02/12/2017   Procedure: Lumbar Three-Four, Lumbar Four-Five, Lumbar Five-Sacral One Laminectomy and Foraminotomy;  Surgeon: Earnie Larsson, MD;  Location: [facility];  Service: Neurosurgery;  Laterality: N/A;   OTHER SURGICAL HISTORY     microwave procedure for prostate - 09/12    PROSTATE ABLATION     microwave   REPLACEMENT TOTAL KNEE     THYROIDECTOMY     TONSILLECTOMY AND ADENOIDECTOMY     TOTAL KNEE ARTHROPLASTY  05/11/2011   Procedure: TOTAL KNEE ARTHROPLASTY;  Surgeon: Gearlean Alf;  Location: [facility];  Service: Orthopedics;  Laterality: Right;   US ECHOCARDIOGRAPHY  11/10/2006   EF 55-60%   US ECHOCARDIOGRAPHY  09/20/2002   EF 65-70%    FAMILY HISTORY Family History  Problem Relation Age of Onset   Hypertension Mother    Arthritis Mother    Diabetes Mother    Hypertension Father    Stroke Father    Kidney disease Father    Hypertension Brother    Diabetes Brother     SOCIAL HISTORY Social History   Tobacco Use   Smoking status: Never   Smokeless tobacco: Never  Vaping Use   Vaping Use: Never used  Substance Use Topics   Alcohol use: Yes    Alcohol/week: 7.0 standard drinks of alcohol    Types: 7 Shots of liquor per week    Comment: 2 drinks most days   Drug use: No         OPHTHALMIC EXAM:  Base Eye Exam     Visual Acuity (ETDRS)       Right Left   Dist cc 20/50 +1 CF at face    Correction: Glasses         Tonometry (Tonopen, 10:34 AM)       Right Left   Pressure 10 10         Pupils       Pupils   Right PERRL   Left PERRL         Extraocular Movement       Right Left    Ortho Ortho    -- -- --  --  --  -- -- --   -- -- --  --  --  -- -- --           Neuro/Psych     Oriented x3: Yes   Mood/Affect: Normal         Dilation     Both eyes: 1.0% Mydriacyl, 2.5% Phenylephrine @ 10:32 AM            Slit Lamp and Fundus Exam     External Exam       Right Left   External Normal Normal  Slit Lamp Exam       Right Left   Lids/Lashes Normal Normal   Conjunctiva/Sclera White and quiet White and quiet   Cornea Clear Clear   Anterior Chamber Deep and quiet Deep and quiet   Iris Round and reactive Round and reactive   Lens Posterior chamber intraocular lens Posterior chamber intraocular lens   Anterior Vitreous Normal Normal         Fundus Exam       Right Left   Posterior Vitreous Normal Clear and vitrectomized   Disc Normal Normal   C/D Ratio 0.35 0.35   Macula Advanced age related macular degeneration, Geographic atrophy, Hard drusen, no exudates, no clinical Subretinal neovascular membrane, no hemorrhage, Retinal pigment epithelial mottling, Retinal pigment epithelial atrophy, small hemorrhage on nonfoveal side of atrophy, temporally but not larger Advanced age related macular degeneration, Pigmented atrophy, Retinal pigment epithelial atrophy - geographic   Vessels Normal Normal   Periphery Normal Normal            IMAGING AND PROCEDURES  Imaging and Procedures for 03/04/22  OCT, Retina - OU - Both Eyes       Right Eye Quality was good. Scan locations included subfoveal. Central Foveal Thickness: 229. Progression has improved. Findings include abnormal foveal contour, inner retinal atrophy, outer retinal atrophy.   Left Eye Quality was good. Scan locations included subfoveal. Central Foveal Thickness: 278. Progression has been stable. Findings include epiretinal membrane, inner retinal atrophy, outer retinal atrophy.   Notes OS with minor epiretinal membrane nasal aspect of fovea yet with atrophy no or intervention, with significant subfoveal geographic atrophy and scarring from prior CNVM And dry AMD   OD, no sign of recurrence of CNVM 7-week interval today, stable overall no signs of intraretinal fluid or subretinal fluid     Intravitreal  Injection, Pharmacologic Agent - OD - Right Eye       Time Out 03/04/2022. 11:32 AM. Confirmed correct patient, procedure, site, and patient consented.   Anesthesia Topical anesthesia was used. Anesthetic medications included Lidocaine 4%.   Procedure Preparation included 5% betadine to ocular surface, 10% betadine to eyelids, Tobramycin 0.3%. A 30 gauge needle was used.   Injection: 2.5 mg Bevacizumab 1.'25mg'$ /0.55m   Route: Intravitreal, Site: Right Eye   NDC: [id number] Lot: [id number]  Post-op Post injection exam found visual acuity of at least counting fingers. The patient tolerated the procedure well. There were no complications. The patient received written and verbal post procedure care education. Post injection medications included ocuflox.              ASSESSMENT/PLAN:  Exudative age-related macular degeneration of right eye with active choroidal neovascularization (HCC) OD doing very well post injection Avastin at 7-week interval.  Long chronic history of recurrences.  Maintain 7 to 8-week interval and repeat injection today  Epiretinal membrane, right eye Minor OD  Advanced nonexudative age-related macular degeneration of left eye with subfoveal involvement Accounts for acuity      ICD-10-CM   1. Exudative age-related macular degeneration of right eye with active choroidal neovascularization (HCC)  H35.3211 OCT, Retina - OU - Both Eyes    Intravitreal Injection, Pharmacologic Agent - OD - Right Eye    Bevacizumab (AVASTIN) SOLN 2.5 mg    2. Epiretinal membrane, right eye  H35.371     3. Advanced nonexudative age-related macular degeneration of left eye with subfoveal involvement  H35.3124       1.  OD, with chronic recurrent  CNVM.  Stabilized with maintained acuity currently at 7-week interval.  Repeat examination today can confirm stability.  Repeat injection today and reevaluate next in 7 to 8 weeks  2.  3.  Ophthalmic Meds Ordered this visit:   Meds ordered this encounter  Medications   Bevacizumab (AVASTIN) SOLN 2.5 mg       Return in about 7 weeks (around 04/22/2022) for DILATE OU, AVASTIN OCT, OD.  There are no Patient Instructions on file for this visit.   Explained the diagnoses, plan, and follow up with the patient and they expressed understanding.  Patient expressed understanding of the importance of proper follow up care.   Clent Demark Corby Villasenor M.D. Diseases & Surgery of the Retina and Vitreous [facility] [place] 03/04/22     Abbreviations: M myopia (nearsighted); A astigmatism; H hyperopia (farsighted); P presbyopia; Mrx spectacle prescription;  CTL contact lenses; OD right eye; OS left eye; OU both eyes  XT exotropia; ET esotropia; PEK punctate epithelial keratitis; PEE punctate epithelial erosions; DES dry eye syndrome; MGD meibomian gland dysfunction; ATs artificial tears; PFAT's preservative free artificial tears; [place] nuclear sclerotic cataract; PSC posterior subcapsular cataract; ERM epi-retinal membrane; PVD posterior vitreous detachment; RD retinal detachment; DM diabetes mellitus; DR diabetic retinopathy; NPDR non-proliferative diabetic retinopathy; PDR proliferative diabetic retinopathy; CSME clinically significant macular edema; DME diabetic macular edema; dbh dot blot hemorrhages; CWS cotton wool spot; POAG primary open angle glaucoma; C/D cup-to-disc ratio; HVF humphrey visual field; GVF goldmann visual field; OCT optical coherence tomography; IOP intraocular pressure; BRVO Branch retinal vein occlusion; CRVO central retinal vein occlusion; CRAO central retinal artery occlusion; BRAO branch retinal artery occlusion; RT retinal tear; SB scleral buckle; PPV pars plana vitrectomy; VH Vitreous hemorrhage; PRP panretinal laser photocoagulation; IVK intravitreal kenalog; VMT vitreomacular traction; MH Macular hole;  NVD neovascularization of the disc; NVE neovascularization elsewhere; AREDS age related eye disease  study; ARMD age related macular degeneration; POAG primary open angle glaucoma; EBMD epithelial/anterior basement membrane dystrophy; ACIOL anterior chamber intraocular lens; IOL intraocular lens; PCIOL posterior chamber intraocular lens; Phaco/IOL phacoemulsification with intraocular lens placement; Portal photorefractive keratectomy; LASIK laser assisted in situ keratomileusis; HTN hypertension; DM diabetes mellitus; COPD chronic obstructive pulmonary disease

## 2022-03-10 ENCOUNTER — Ambulatory Visit (HOSPITAL_BASED_OUTPATIENT_CLINIC_OR_DEPARTMENT_OTHER): Payer: Medicare Other | Admitting: Anesthesiology

## 2022-03-10 ENCOUNTER — Encounter (HOSPITAL_COMMUNITY): Payer: Self-pay | Admitting: Cardiology

## 2022-03-10 ENCOUNTER — Ambulatory Visit (HOSPITAL_COMMUNITY)
Admission: RE | Admit: 2022-03-10 | Discharge: 2022-03-10 | Disposition: A | Discharge status: Home / Self Care | Payer: Medicare Other | Attending: Cardiology | Admitting: Cardiology

## 2022-03-10 ENCOUNTER — Ambulatory Visit (HOSPITAL_BASED_OUTPATIENT_CLINIC_OR_DEPARTMENT_OTHER)
Admission: RE | Admit: 2022-03-10 | Discharge: 2022-03-10 | Disposition: A | Discharge status: Home / Self Care | Payer: Medicare Other | Source: Home / Self Care | Attending: Cardiology | Admitting: Cardiology

## 2022-03-10 ENCOUNTER — Encounter (HOSPITAL_COMMUNITY)
Admission: RE | Disposition: A | Discharge status: Home / Self Care | Payer: Self-pay | Source: Home / Self Care | Attending: Cardiology

## 2022-03-10 ENCOUNTER — Other Ambulatory Visit: Payer: Self-pay

## 2022-03-10 ENCOUNTER — Ambulatory Visit (HOSPITAL_COMMUNITY): Payer: Medicare Other | Admitting: Anesthesiology

## 2022-03-10 DIAGNOSIS — I4892 Unspecified atrial flutter: Secondary | ICD-10-CM | POA: Diagnosis not present

## 2022-03-10 DIAGNOSIS — Z8673 Personal history of transient ischemic attack (TIA), and cerebral infarction without residual deficits: Secondary | ICD-10-CM | POA: Diagnosis not present

## 2022-03-10 DIAGNOSIS — I700 Atherosclerosis of aorta: Secondary | ICD-10-CM | POA: Diagnosis not present

## 2022-03-10 DIAGNOSIS — I491 Atrial premature depolarization: Secondary | ICD-10-CM | POA: Diagnosis not present

## 2022-03-10 DIAGNOSIS — E039 Hypothyroidism, unspecified: Secondary | ICD-10-CM | POA: Diagnosis not present

## 2022-03-10 DIAGNOSIS — I4510 Unspecified right bundle-branch block: Secondary | ICD-10-CM | POA: Insufficient documentation

## 2022-03-10 DIAGNOSIS — I10 Essential (primary) hypertension: Secondary | ICD-10-CM | POA: Diagnosis not present

## 2022-03-10 DIAGNOSIS — F419 Anxiety disorder, unspecified: Secondary | ICD-10-CM | POA: Insufficient documentation

## 2022-03-10 DIAGNOSIS — Z7984 Long term (current) use of oral hypoglycemic drugs: Secondary | ICD-10-CM

## 2022-03-10 DIAGNOSIS — E1140 Type 2 diabetes mellitus with diabetic neuropathy, unspecified: Secondary | ICD-10-CM | POA: Insufficient documentation

## 2022-03-10 DIAGNOSIS — I4819 Other persistent atrial fibrillation: Secondary | ICD-10-CM

## 2022-03-10 DIAGNOSIS — I081 Rheumatic disorders of both mitral and tricuspid valves: Secondary | ICD-10-CM | POA: Insufficient documentation

## 2022-03-10 DIAGNOSIS — I119 Hypertensive heart disease without heart failure: Secondary | ICD-10-CM | POA: Insufficient documentation

## 2022-03-10 DIAGNOSIS — I4891 Unspecified atrial fibrillation: Secondary | ICD-10-CM

## 2022-03-10 DIAGNOSIS — I480 Paroxysmal atrial fibrillation: Secondary | ICD-10-CM | POA: Diagnosis not present

## 2022-03-10 DIAGNOSIS — I482 Chronic atrial fibrillation, unspecified: Secondary | ICD-10-CM

## 2022-03-10 DIAGNOSIS — Z7901 Long term (current) use of anticoagulants: Secondary | ICD-10-CM | POA: Insufficient documentation

## 2022-03-10 DIAGNOSIS — M1990 Unspecified osteoarthritis, unspecified site: Secondary | ICD-10-CM | POA: Insufficient documentation

## 2022-03-10 DIAGNOSIS — I340 Nonrheumatic mitral (valve) insufficiency: Secondary | ICD-10-CM

## 2022-03-10 DIAGNOSIS — K219 Gastro-esophageal reflux disease without esophagitis: Secondary | ICD-10-CM | POA: Insufficient documentation

## 2022-03-10 DIAGNOSIS — I080 Rheumatic disorders of both mitral and aortic valves: Secondary | ICD-10-CM | POA: Diagnosis not present

## 2022-03-10 DIAGNOSIS — E119 Type 2 diabetes mellitus without complications: Secondary | ICD-10-CM

## 2022-03-10 DIAGNOSIS — F32A Depression, unspecified: Secondary | ICD-10-CM | POA: Insufficient documentation

## 2022-03-10 DIAGNOSIS — I350 Nonrheumatic aortic (valve) stenosis: Secondary | ICD-10-CM | POA: Diagnosis not present

## 2022-03-10 HISTORY — PX: CARDIOVERSION: SHX1299

## 2022-03-10 HISTORY — PX: TEE WITHOUT CARDIOVERSION: SHX5443

## 2022-03-10 LAB — ECHO TEE
AV Mean grad: 20 mmHg
AV Peak grad: 36 mmHg
Ao pk vel: 3 m/s

## 2022-03-10 SURGERY — CARDIOVERSION
Anesthesia: General

## 2022-03-10 MED ORDER — PROPOFOL 500 MG/50ML IV EMUL
INTRAVENOUS | Status: DC | PRN
  Administered 2022-03-10: 125 ug/kg/min via INTRAVENOUS

## 2022-03-10 MED ORDER — SODIUM CHLORIDE 0.9 % IV SOLN: INTRAVENOUS | Status: DC

## 2022-03-10 NOTE — Procedures (Signed)
TEE Cardioversion Procedure Note Stephen Wilkinson [id number] [birth year]  After further discussion, pt unclear if he missed any doses of apixaban in last 21 days. TEE added to R/O LAA thrombus prior to DCCV.  Normal LV function; no LAA thrombus.  Procedure: Electrical Cardioversion Indications:  Atrial Fibrillation  Procedure Details Consent: Risks of procedure as well as the alternatives and risks of each were explained to the (patient/caregiver).  Consent for procedure obtained. Time Out: Verified patient identification, verified procedure, site/side was marked, verified correct patient position, special equipment/implants available, medications/allergies/relevent history reviewed, required imaging and test results available.  Performed  Patient placed on cardiac monitor, pulse oximetry, supplemental oxygen as necessary.  Sedation given:  Pt sedated by anesthesia with lidocaine mg and diprovan mg IV. Pacer pads placed anterior and posterior chest.  Cardioverted 1 time(s).  Cardioverted at [place].  Evaluation Findings: Post procedure EKG shows: NSR Complications: None Patient did tolerate procedure well.   Stephen Wilkinson 03/10/2022, 10:46 AM

## 2022-03-10 NOTE — H&P (Signed)
Office Visit  01/12/2022 [facility] Wendy Poet, MD Cardiology Persistent atrial fibrillation [facility]) +3 more Dx Follow-up  Atrial Fibrillation; Referred by Biagio Borg, MD Reason for Visit   Additional Documentation  Vitals:  BP 140/90 Pulse 73 Ht '6\' 1"'$  (1.854 m) Wt 75.3 kg SpO2 97% BMI 21.90 kg/m BSA 1.97 m  Flowsheets:  NEWS, MEWS Score, Anthropometrics   Encounter Info:  Billing Info, History, Allergies, Detailed Report    All Notes    Progress Notes by [name], MD at 01/12/2022 3:30 PM  Author: [name], MD Author Type: Physician Filed: 01/12/2022  4:09 PM  Note Status: Signed Cosign: Cosign Not Required Encounter Date: 01/12/2022  Editor: [name], MD (Physician)                        HPI: Follow-up hypertension and atrial fibrillation. Also history of TIA. Nuclear study March 2017 showed ejection fraction 62% and no ischemia.  Carotid Dopplers May 2020 showed 1 to 39% bilateral stenosis. Question episodes of syncope at previous ov. Monitor 4/21 showed sinus with PACs, PVCs brief PAT but no pauses.  Found to be in atrial fibrillation at office visit May 30, 2021.  Apixaban was initiated with plans for cardioversion if atrial fibrillation persisted as he did complain of increased fatigue. Echocardiogram repeated January 2023 and showed normal LV function, mild left ventricular hypertrophy, mild right ventricular enlargement, mild left atrial enlargement, mild to moderate tricuspid regurgitation, mildly dilated ascending aorta, moderate aortic stenosis with mean gradient 12 mmHg, aortic valve area 1.5 cm and dimensionless index 0.3.  TEE February 2023 showed no left atrial appendage thrombus, ejection fraction 55 to 60%, mild biatrial enlargement, mild mitral regurgitation and moderate aortic stenosis.  Patient had successful cardioversion to normal sinus rhythm.  Atrial fibrillation recurred.  Amiodarone initiated  and he had repeat cardioversion December 15 2021.  Since last seen, following his last cardioversion he initially felt extremely well for 2 weeks with much more energy.  However in the past 1 to 2 weeks he notes decreased energy again.  There is no dyspnea, chest pain, palpitations, syncope or bleeding.         Current Outpatient Medications  Medication Sig Dispense Refill   amiodarone (PACERONE) 200 MG tablet Take 1 tablet (200 mg total) by mouth daily. 90 tablet 3   apixaban (ELIQUIS) 2.5 MG TABS tablet Take 1 tablet (2.5 mg total) by mouth 2 (two) times daily. 60 tablet     Bevacizumab (AVASTIN IV) Place 1 Syringe into the right eye See admin instructions. 1 dose injected in the right eye every 7 weeks       famotidine (PEPCID) 20 MG tablet Take 1 tablet (20 mg total) by mouth 2 (two) times daily. (Patient taking differently: Take 20 mg by mouth in the morning.) 180 tablet 3   glucose blood (ONE TOUCH ULTRA TEST) test strip Use as directed once daily to check blood sugar.  Diagnosis code E11.42 100 each 11   latanoprost (XALATAN) 0.005 % ophthalmic solution Place 1 drop into both eyes at bedtime. 2.5 mL 12   levothyroxine (SYNTHROID) 125 MCG tablet TAKE 1 TABLET EVERY DAY BEFORE BREAKFAST (Patient taking differently: Take 125 mcg by mouth daily before breakfast.) 90 tablet 3   lidocaine (LIDODERM) 5 % Place 1-3 patches onto the skin daily. Remove & Discard patch within 12 hours or as directed by MD 30 patch 0  melatonin 3 MG TABS tablet Take 1 tablet (3 mg total) by mouth at bedtime.   0   metFORMIN (GLUCOPHAGE) 1000 MG tablet TAKE 1 TABLET EVERY DAY (Patient taking differently: Take 1,000 mg by mouth daily with breakfast.) 90 tablet 0   metoprolol succinate (TOPROL-XL) 25 MG 24 hr tablet Take 1 tablet (25 mg total) by mouth every morning. 90 tablet 3   Multiple Vitamins-Minerals (ICAPS AREDS 2 PO) Take 2 capsules by mouth daily.        No current facility-administered medications for this visit.             Past Medical History:  Diagnosis Date   Anxiety     B12 deficiency 09/23/2016   Blood transfusion without reported diagnosis     Cancer ([place])      basal and squamous skin carcinoma    Cataract     Depression     Diabetes mellitus     GERD (gastroesophageal reflux disease)     Glaucoma 01/04/2013   Hx of transient ischemic attack (TIA) 01/04/2013   Hx: UTI (urinary tract infection)     Hyperlipidemia     Hypertension     Hypothyroidism     Insomnia     Macular degeneration     Neuropathy     OA (osteoarthritis)     Pneumonia     PVC's (premature ventricular contractions)     Renal mass     Stroke ([place])      mid 1990s            Past Surgical History:  Procedure Laterality Date   APPENDECTOMY       CARDIOVERSION N/A 07/30/2021    Procedure: CARDIOVERSION;  Surgeon: Josue Hector, MD;  Location: [facility];  Service: Cardiovascular;  Laterality: N/A;   CARDIOVERSION N/A 12/15/2021    Procedure: CARDIOVERSION;  Surgeon: Jerline Pain, MD;  [facility];  Service: Cardiovascular;  Laterality: N/A;   CATARACT EXTRACTION W/ INTRAOCULAR LENS  IMPLANT, BILATERAL       INGUINAL HERNIA REPAIR        X3   IR KYPHO LUMBAR INC FX REDUCE BONE BX UNI/BIL CANNULATION INC/IMAGING   11/04/2021   LUMBAR LAMINECTOMY/DECOMPRESSION MICRODISCECTOMY N/A 02/12/2017    Procedure: Lumbar Three-Four, Lumbar Four-Five, Lumbar Five-Sacral One Laminectomy and Foraminotomy;  Surgeon: Earnie Larsson, MD;  Location: [facility];  Service: Neurosurgery;  Laterality: N/A;   OTHER SURGICAL HISTORY        microwave procedure for prostate - 09/12    PROSTATE ABLATION        microwave   REPLACEMENT TOTAL KNEE       THYROIDECTOMY       TONSILLECTOMY AND ADENOIDECTOMY       TOTAL KNEE ARTHROPLASTY   05/11/2011    Procedure: TOTAL KNEE ARTHROPLASTY;  Surgeon: Gearlean Alf;  [facility];  Service: Orthopedics;  Laterality: Right;   US ECHOCARDIOGRAPHY   11/10/2006    EF 55-60%   US  ECHOCARDIOGRAPHY   09/20/2002    EF 65-70%      Social History         Socioeconomic History   Marital status: Married      Spouse name: Not on file   Number of children: 3   Years of education: Not on file   Highest education level: Not on file  Occupational History   Occupation: retired      Fish farm manager: RETIRED  Comment: [employer]  Tobacco Use   Smoking status: Never   Smokeless tobacco: Never  Vaping Use   Vaping Use: Never used  Substance and Sexual Activity   Alcohol use: Yes      Alcohol/week: 7.0 standard drinks of alcohol      Types: 7 Shots of liquor per week      Comment: 2 drinks most days   Drug use: No   Sexual activity: Never      Birth control/protection: None  Other Topics Concern   Not on file  Social History Narrative    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, 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[employer]         Home Situation: living with wife         Spiritual Beliefs: episcopalian         Lifestyle: walks daily, working on diet                   Social Determinants of Health        Financial Resource Strain: Low Risk  (09/10/2020)    Overall Financial Resource Strain (CARDIA)     Difficulty of Paying Living Expenses: Not hard at all  Food Insecurity: No Food Insecurity (09/10/2020)    Hunger Vital Sign     Worried About [place] in the Last Year: Never true     [place] in the Last Year: Never true  Transportation Needs: No Transportation Needs (09/10/2020)    PRAPARE - Armed forces logistics/support/administrative officer (Medical): No     Lack of Transportation (Non-Medical): No  Physical Activity: Inactive (09/10/2020)    Exercise Vital Sign     Days of Exercise per Week: 0 days     Minutes of Exercise per Session: 0 min  Stress: No Stress Concern Present (09/10/2020)    [place]     Feeling of Stress : Not at all  Social Connections: Not on file  Intimate Partner  Violence: Not on file           Family History  Problem Relation Age of Onset   Hypertension Mother     Arthritis Mother     Diabetes Mother     Hypertension Father     Stroke Father     Kidney disease Father     Hypertension Brother     Diabetes Brother        ROS: no fevers or chills, productive cough, hemoptysis, dysphasia, odynophagia, melena, hematochezia, dysuria, hematuria, rash, seizure activity, orthopnea, PND, pedal edema, claudication. Remaining systems are negative.   Physical Exam: Well-developed well-nourished in no acute distress.  Skin is warm and dry.  HEENT is normal.  Neck is supple.  Chest is clear to auscultation with normal expansion.  Cardiovascular exam is irregular, 2/6 systolic murmur left sternal border.  S2 is not diminished. Abdominal exam nontender or distended. No masses palpated. Extremities show no edema. neuro grossly intact   ECG-atrial flutter, left axis deviation, septal infarct.  Personally reviewed   A/P   1 paroxysmal atrial fibrillation-patient has developed recurrent atrial arrhythmias (is in atrial flutter today).  He is symptomatic with decreased energy.  Not sure that he would be a candidate for aggressive procedures such as ablation.  I have discussed the options and he would like to try to reestablish sinus.  I will increase amiodarone to 200 mg  twice daily for 6 weeks then resume 200 mg daily.  Repeat cardioversion in 8 weeks.  I am hopeful that he will remain in sinus rhythm has more amiodarone is in his system.  If not we may need to continue with rate control with the understanding his symptoms may not improve.  We will recheck hemoglobin and renal function.  If his creatinine is less than 1.5 will increase apixaban to 5 mg twice daily.  I would ultimately like to discontinue apixaban if we can reestablish sinus rhythm as he has had falls in the past and the risk of anticoagulation is increased.     2 aortic stenosis-moderate on  most recent echocardiogram.  He will require follow-up studies in the future and likely TAVR.  Repeat echocardiogram January 2024.   3 hypertension-blood pressure controlled.  Continue present medical regimen and follow.   4 history of TIA-continue apixaban for now.   5 hyperlipidemia-Per primary care   Kirk Ruths, MD    As above; patient seen and examined; mild dyspnea but no chest pain; PMH, social history ROS unchanged; physical exam; CV irregular, chest CTA, ext no edema, neuro grossly intact. Patient has been compliant with apixaban; plan to proceed with DCCV. Kirk Ruths

## 2022-03-10 NOTE — Progress Notes (Signed)
  Echocardiogram Echocardiogram Transesophageal has been performed.  Stephen Wilkinson M 03/10/2022, 11:47 AM

## 2022-03-10 NOTE — Anesthesia Preprocedure Evaluation (Addendum)
Anesthesia Evaluation  Patient identified by MRN, date of birth, ID band Patient awake    Reviewed: Allergy & Precautions, NPO status , Patient's Chart, lab work & pertinent test results, reviewed documented beta blocker date and time   Airway Mallampati: II  TM Distance: >3 FB Neck ROM: Full    Dental  (+) Teeth Intact, Dental Advisory Given   Pulmonary neg pulmonary ROS,    Pulmonary exam normal breath sounds clear to auscultation       Cardiovascular hypertension, Pt. on medications and Pt. on home beta blockers + dysrhythmias Atrial Fibrillation + Valvular Problems/Murmurs (mild MR mod AS) MR and AS  Rhythm:Irregular Rate:Normal  Echo 07/2021 1. No LAA thrombus DCC x 1 120 J biphasic converted successfully to NSR  rate 68 with rare PAC.  2. Left ventricular ejection fraction, by estimation, is 55 to 60%. The  left ventricle has normal function.  3. Right ventricular systolic function is normal. The right ventricular  size is normal.  4. Left atrial size was mildly dilated. No left atrial/left atrial  appendage thrombus was detected.  5. Right atrial size was mildly dilated.  6. The mitral valve is abnormal. Mild mitral valve regurgitation.  7. The aortic valve is tricuspid. There is moderate calcification of the  aortic valve. There is moderate thickening of the aortic valve. Aortic  valve regurgitation is not visualized. Moderate aortic valve stenosis.    Neuro/Psych PSYCHIATRIC DISORDERS Anxiety Depression CVA (1990s), No Residual Symptoms    GI/Hepatic Neg liver ROS, GERD  Medicated,  Endo/Other  diabetes, Type 2, Oral Hypoglycemic AgentsHypothyroidism   Renal/GU negative Renal ROS  negative genitourinary   Musculoskeletal  (+) Arthritis , Osteoarthritis,    Abdominal   Peds  Hematology negative hematology ROS (+)   Anesthesia Other Findings   Reproductive/Obstetrics negative OB ROS                             Anesthesia Physical Anesthesia Plan  ASA: 3  Anesthesia Plan: General   Post-op Pain Management:    Induction: Intravenous  PONV Risk Score and Plan: TIVA and Treatment may vary due to age or medical condition  Airway Management Planned: Natural Airway and Mask  Additional Equipment: None  Intra-op Plan:   Post-operative Plan:   Informed Consent: I have reviewed the patients History and Physical, chart, labs and discussed the procedure including the risks, benefits and alternatives for the proposed anesthesia with the patient or authorized representative who has indicated his/her understanding and acceptance.     Dental advisory given  Plan Discussed with: CRNA  Anesthesia Plan Comments:        Anesthesia Quick Evaluation

## 2022-03-10 NOTE — Discharge Instructions (Signed)

## 2022-03-10 NOTE — Anesthesia Postprocedure Evaluation (Signed)
Anesthesia Post Note  Patient: Stephen Wilkinson  Procedure(s) Performed: CARDIOVERSION TRANSESOPHAGEAL ECHOCARDIOGRAM (TEE)     Patient location during evaluation: PACU Anesthesia Type: General Level of consciousness: awake and alert Pain management: pain level controlled Vital Signs Assessment: post-procedure vital signs reviewed and stable Respiratory status: spontaneous breathing, nonlabored ventilation and respiratory function stable Cardiovascular status: blood pressure returned to baseline and stable Postop Assessment: no apparent nausea or vomiting Anesthetic complications: no   No notable events documented.  Last Vitals:  Vitals:   03/10/22 1149 03/10/22 1152  BP:  129/61  Pulse: (!) 47 (!) 47  Resp: 15 16  Temp:    SpO2: 97% 96%    Last Pain:  Vitals:   03/10/22 1152  TempSrc:   PainSc: 0-No pain                 Thia Olesen A.

## 2022-03-10 NOTE — Transfer of Care (Signed)
Immediate Anesthesia Transfer of Care Note  Patient: Stephen Wilkinson  Procedure(s) Performed: CARDIOVERSION TRANSESOPHAGEAL ECHOCARDIOGRAM (TEE)  Patient Location: PACU  Anesthesia Type:MAC  Level of Consciousness: drowsy  Airway & Oxygen Therapy: Patient Spontanous Breathing and Patient connected to nasal cannula oxygen  Post-op Assessment: Report given to RN and Post -op Vital signs reviewed and stable  Post vital signs: Reviewed and stable  Last Vitals:  Vitals Value Taken Time  BP 119/70 03/10/22 1126  Temp 36.4 C 03/10/22 1126  Pulse 54 03/10/22 1127  Resp 22 03/10/22 1127  SpO2 98 % 03/10/22 1127  Vitals shown include unvalidated device data.  Last Pain:  Vitals:   03/10/22 1126  TempSrc: Tympanic  PainSc: 0-No pain         Complications: No notable events documented.

## 2022-03-10 NOTE — Interval H&P Note (Signed)
History and Physical Interval Note:  03/10/2022 10:47 AM  Stephen Wilkinson  has presented today for surgery, with the diagnosis of AFIB.  The various methods of treatment have been discussed with the patient and family. After consideration of risks, benefits and other options for treatment, the patient has consented to  Procedure(s): CARDIOVERSION (N/A) as a surgical intervention.  The patient's history has been reviewed, patient examined, no change in status, stable for surgery.  I have reviewed the patient's chart and labs.  Questions were answered to the patient's satisfaction.     Kirk Ruths

## 2022-03-11 ENCOUNTER — Encounter (HOSPITAL_COMMUNITY): Payer: Self-pay | Admitting: Cardiology

## 2022-03-11 ENCOUNTER — Telehealth: Payer: Self-pay | Admitting: Cardiology

## 2022-03-11 MED ORDER — AMIODARONE HCL 200 MG PO TABS: 200.0000 mg | ORAL_TABLET | Freq: Every day | ORAL | 3 refills | Status: DC

## 2022-03-11 NOTE — Telephone Encounter (Signed)
Spouse and patient informed of Amiodarone '200mg'$  daily. Prescription sent to his preferred pharmacy. Med list updated for amiodarone.

## 2022-03-11 NOTE — Telephone Encounter (Signed)
Pt c/o medication issue:  1. Name of Medication: amiodarone (PACERONE) 200 MG tablet  2. How are you currently taking this medication (dosage and times per day)?   3. Are you having a reaction (difficulty breathing--STAT)?   4. What is your medication issue? Pt's wife would like a call back to get clarification as to how the patient should be taking this medication. She states they have two different instructions. Please advise.

## 2022-03-11 NOTE — Telephone Encounter (Signed)
Patient had cardioversion yesterday. Wife wants to know how he is to take amiodarone.'200mg'$ . There are two different instructions on his medication list. Please advise.

## 2022-03-15 NOTE — H&P (Signed)
[name], MD Physician Cardiology H&P    Signed Date of Service:  03/10/2022 12:11 PM   Signed                      [name], MD Physician Cardiology Interval H&P Note    Signed Date of Service:  03/10/2022 10:47 AM           Signed                                                                                                                         History and Physical Interval Note:   03/10/2022 10:47 AM   Stephen Wilkinson  has presented today for surgery, with the diagnosis of AFIB.  The various methods of treatment have been discussed with the patient and family. After consideration of risks, benefits and other options for treatment, the patient has consented to  Procedure(s): CARDIOVERSION (N/A) as a surgical intervention.  The patient's history has been reviewed, patient examined, no change in status, stable for surgery.  I have reviewed the patient's chart and labs.  Questions were answered to the patient's satisfaction.       Kirk Ruths              Note Details   Vonna Kotyk, Denice Bors, MD File Time 03/10/2022 10:47 AM  Author Type Physician Status Signed  Last Editor [name], [place] # [id number] Admit Date 03/10/2022      [name], MD Physician Cardiology H&P    Signed Date of Service:  03/10/2022 10:43 AM                   Signed                                                                      Office Visit  01/12/2022 [facility] Wendy Poet, MD Cardiology Persistent atrial fibrillation [facility]) +3 more Dx Follow-up  Atrial Fibrillation; Referred by Biagio Borg, MD Reason for Visit    Additional Documentation   Vitals:  BP 140/90 Pulse 73 Ht '6\' 1"'$  (1.854 m) Wt 75.3 kg SpO2 97% BMI 21.90 kg/m BSA 1.97 m  Flowsheets:  NEWS, MEWS Score, Anthropometrics    Encounter Info:  Billing  Info, History, Allergies, Detailed Report      All Notes      Progress Notes by [name], MD at 01/12/2022 3:30 PM                            Author: [name], MD Author Type: Physician Filed: 01/12/2022  4:09 PM     Note Status: Signed Cosign: Cosign Not Required Encounter Date: 01/12/2022     Editor: [name], MD (Physician)                                        HPI: Follow-up hypertension and atrial fibrillation. Also history of TIA. Nuclear study March 2017 showed ejection fraction 62% and no ischemia.  Carotid Dopplers May 2020 showed 1 to 39% bilateral stenosis. Question episodes of syncope at previous ov. Monitor 4/21 showed sinus with PACs, PVCs brief PAT but no pauses.  Found to be in atrial fibrillation at office visit May 30, 2021.  Apixaban was initiated with plans for cardioversion if atrial fibrillation persisted as he did complain of increased fatigue. Echocardiogram repeated January 2023 and showed normal LV function, mild left ventricular hypertrophy, mild right ventricular enlargement, mild left atrial enlargement, mild to moderate tricuspid regurgitation, mildly dilated ascending aorta, moderate aortic stenosis with mean gradient 12 mmHg, aortic valve area 1.5 cm and dimensionless index 0.3.  TEE February 2023 showed no left atrial appendage thrombus, ejection fraction 55 to 60%, mild biatrial enlargement, mild mitral regurgitation and moderate aortic stenosis.  Patient had successful cardioversion to normal sinus rhythm.  Atrial fibrillation recurred.  Amiodarone initiated and he had repeat cardioversion December 15 2021.  Since last seen, following his last cardioversion he initially felt extremely well for 2 weeks with much more energy.  However in the past 1 to 2 weeks he notes decreased energy again.  There is no dyspnea, chest pain, palpitations, syncope or bleeding.              Current Outpatient Medications  Medication Sig Dispense  Refill   amiodarone (PACERONE) 200 MG tablet Take 1 tablet (200 mg total) by mouth daily. 90 tablet 3   apixaban (ELIQUIS) 2.5 MG TABS tablet Take 1 tablet (2.5 mg total) by mouth 2 (two) times daily. 60 tablet     Bevacizumab (AVASTIN IV) Place 1 Syringe into the right eye See admin instructions. 1 dose injected in the right eye every 7 weeks       famotidine (PEPCID) 20 MG tablet Take 1 tablet (20 mg total) by mouth 2 (two) times daily. (Patient taking differently: Take 20 mg by mouth in the morning.) 180 tablet 3   glucose blood (ONE TOUCH ULTRA TEST) test strip Use as directed once daily to check blood sugar.  Diagnosis code E11.42 100 each 11   latanoprost (XALATAN) 0.005 % ophthalmic solution Place 1 drop into both eyes at bedtime. 2.5 mL 12   levothyroxine (SYNTHROID) 125 MCG tablet TAKE 1 TABLET EVERY DAY BEFORE BREAKFAST (Patient taking differently: Take 125 mcg by mouth daily before breakfast.) 90 tablet 3   lidocaine (LIDODERM) 5 % Place 1-3 patches onto the skin daily. Remove & Discard patch within 12 hours or as directed by MD 30 patch 0   melatonin 3 MG TABS tablet Take 1 tablet (3 mg total) by mouth at bedtime.   0   metFORMIN (GLUCOPHAGE) 1000 MG tablet TAKE 1 TABLET EVERY DAY (Patient taking differently: Take 1,000 mg by mouth daily with breakfast.) 90 tablet 0   metoprolol succinate (TOPROL-XL) 25 MG 24 hr tablet Take 1 tablet (25 mg total) by mouth every morning. 90 tablet 3   Multiple Vitamins-Minerals (ICAPS AREDS 2 PO) Take 2 capsules by mouth daily.  No current facility-administered medications for this visit.               Past Medical History:  Diagnosis Date   Anxiety     B12 deficiency 09/23/2016   Blood transfusion without reported diagnosis     Cancer ([place])      basal and squamous skin carcinoma    Cataract     Depression     Diabetes mellitus     GERD (gastroesophageal reflux disease)     Glaucoma 01/04/2013   Hx of transient ischemic attack (TIA)  01/04/2013   Hx: UTI (urinary tract infection)     Hyperlipidemia     Hypertension     Hypothyroidism     Insomnia     Macular degeneration     Neuropathy     OA (osteoarthritis)     Pneumonia     PVC's (premature ventricular contractions)     Renal mass     Stroke ([place])      mid 1990s                Past Surgical History:  Procedure Laterality Date   APPENDECTOMY       CARDIOVERSION N/A 07/30/2021    Procedure: CARDIOVERSION;  Surgeon: Josue Hector, MD;  Location: [facility];  Service: Cardiovascular;  Laterality: N/A;   CARDIOVERSION N/A 12/15/2021    Procedure: CARDIOVERSION;  Surgeon: Jerline Pain, MD;  Location: [facility];  Service: Cardiovascular;  Laterality: N/A;   CATARACT EXTRACTION W/ INTRAOCULAR LENS  IMPLANT, BILATERAL       INGUINAL HERNIA REPAIR        X3   IR KYPHO LUMBAR INC FX REDUCE BONE BX UNI/BIL CANNULATION INC/IMAGING   11/04/2021   LUMBAR LAMINECTOMY/DECOMPRESSION MICRODISCECTOMY N/A 02/12/2017    Procedure: Lumbar Three-Four, Lumbar Four-Five, Lumbar Five-Sacral One Laminectomy and Foraminotomy;  Surgeon: Earnie Larsson, MD;  Location: [facility];  Service: Neurosurgery;  Laterality: N/A;   OTHER SURGICAL HISTORY        microwave procedure for prostate - 09/12    PROSTATE ABLATION        microwave   REPLACEMENT TOTAL KNEE       THYROIDECTOMY       TONSILLECTOMY AND ADENOIDECTOMY       TOTAL KNEE ARTHROPLASTY   05/11/2011    Procedure: TOTAL KNEE ARTHROPLASTY;  Surgeon: Gearlean Alf;  Location: [facility];  Service: Orthopedics;  Laterality: Right;   US ECHOCARDIOGRAPHY   11/10/2006    EF 55-60%   US ECHOCARDIOGRAPHY   09/20/2002    EF 65-70%      Social History             Socioeconomic History   Marital status: Married      Spouse name: Not on file   Number of children: 3   Years of education: Not on file   Highest education level: Not on file  Occupational History   Occupation: retired      Fish farm manager: RETIRED      Comment: [employer]  Tobacco Use   Smoking status: Never   Smokeless tobacco: Never  Vaping Use   Vaping Use: Never used  Substance and Sexual Activity   Alcohol use: Yes      Alcohol/week: 7.0 standard drinks of alcohol      Types: 7 Shots of liquor per week      Comment: 2 drinks most days   Drug use: No  Sexual activity: Never      Birth control/protection: None  Other Topics Concern   Not on file  Social History Narrative    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, 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[employer] Situation: living with wife         Spiritual Beliefs: episcopalian         Lifestyle: walks daily, working on diet                   Social Determinants of Health           Financial Resource Strain: Low Risk  (09/10/2020)    Overall Financial Resource Strain (CARDIA)     Difficulty of Paying Living Expenses: Not hard at all  Food Insecurity: No Food Insecurity (09/10/2020)    Hunger Vital Sign     Worried About [place] in the Last Year: Never true     [place] in the Last Year: Never true  Transportation Needs: No Transportation Needs (09/10/2020)    PRAPARE - Armed forces logistics/support/administrative officer (Medical): No     Lack of Transportation (Non-Medical): No  Physical Activity: Inactive (09/10/2020)    Exercise Vital Sign     Days of Exercise per Week: 0 days     Minutes of Exercise per Session: 0 min  Stress: No Stress Concern Present (09/10/2020)    [place]     Feeling of Stress : Not at all  Social Connections: Not on file  Intimate Partner Violence: Not on file               Family History  Problem Relation Age of Onset   Hypertension Mother     Arthritis Mother     Diabetes Mother     Hypertension Father     Stroke Father     Kidney disease Father     Hypertension Brother     Diabetes Brother        ROS: no fevers or chills, productive cough, hemoptysis, dysphasia,  odynophagia, melena, hematochezia, dysuria, hematuria, rash, seizure activity, orthopnea, PND, pedal edema, claudication. Remaining systems are negative.   Physical Exam: Well-developed well-nourished in no acute distress.  Skin is warm and dry.  HEENT is normal.  Neck is supple.  Chest is clear to auscultation with normal expansion.  Cardiovascular exam is irregular, 2/6 systolic murmur left sternal border.  S2 is not diminished. Abdominal exam nontender or distended. No masses palpated. Extremities show no edema. neuro grossly intact   ECG-atrial flutter, left axis deviation, septal infarct.  Personally reviewed   A/P   1 paroxysmal atrial fibrillation-patient has developed recurrent atrial arrhythmias (is in atrial flutter today).  He is symptomatic with decreased energy.  Not sure that he would be a candidate for aggressive procedures such as ablation.  I have discussed the options and he would like to try to reestablish sinus.  I will increase amiodarone to 200 mg twice daily for 6 weeks then resume 200 mg daily.  Repeat cardioversion in 8 weeks.  I am hopeful that he will remain in sinus rhythm has more amiodarone is in his system.  If not we may need to continue with rate control with the understanding his symptoms may not improve.  We will recheck hemoglobin and renal function.  If his creatinine is less than 1.5 will  increase apixaban to 5 mg twice daily.  I would ultimately like to discontinue apixaban if we can reestablish sinus rhythm as he has had falls in the past and the risk of anticoagulation is increased.     2 aortic stenosis-moderate on most recent echocardiogram.  He will require follow-up studies in the future and likely TAVR.  Repeat echocardiogram January 2024.   3 hypertension-blood pressure controlled.  Continue present medical regimen and follow.   4 history of TIA-continue apixaban for now.   5 hyperlipidemia-Per primary care   Kirk Ruths, MD       As  above; patient seen and examined; mild dyspnea but no chest pain; PMH, social history, family history, ROS unchanged;  Physical exam; CV irregular, chest CTA, ext no edema, neuro grossly intact. Patient states he may have missed apixaban doses; will change to TEE guided DCCV.    Kirk Ruths

## 2022-03-15 NOTE — H&P (Signed)
[name], MD Physician Cardiology Interval H&P Note    Signed Date of Service:  03/10/2022 10:47 AM   Signed                                                                      History and Physical Interval Note:   03/10/2022 10:47 AM   Stephen Wilkinson  has presented today for surgery, with the diagnosis of AFIB.  The various methods of treatment have been discussed with the patient and family. After consideration of risks, benefits and other options for treatment, the patient has consented to  Procedure(s): CARDIOVERSION (N/A) as a surgical intervention.  The patient's history has been reviewed, patient examined, no change in status, stable for surgery.  I have reviewed the patient's chart and labs.  Questions were answered to the patient's satisfaction.       Kirk Ruths             Note Details  Vonna Kotyk, Denice Bors, MD File Time 03/10/2022 10:47 AM  Author Type Physician Status Signed  Last Editor [name], [place] # [id number] Admit Date 03/10/2022     [name], MD Physician Cardiology H&P    Signed Date of Service:  03/10/2022 10:43 AM       Signed                                       Office Visit  01/12/2022 [facility] Wendy Poet, MD Cardiology Persistent atrial fibrillation [facility]) +3 more Dx Follow-up  Atrial Fibrillation; Referred by Biagio Borg, MD Reason for Visit    Additional Documentation   Vitals:  BP 140/90 Pulse 73 Ht '6\' 1"'$  (1.854 m) Wt 75.3 kg SpO2 97% BMI 21.90 kg/m BSA 1.97 m  Flowsheets:  NEWS, MEWS Score, Anthropometrics    Encounter Info:  Billing Info, History, Allergies, Detailed Report      All Notes      Progress Notes by [name], MD at 01/12/2022 3:30 PM           Author: [name], MD Author Type: Physician Filed: 01/12/2022  4:09 PM   Note Status: Signed Cosign: Cosign Not  Required Encounter Date: 01/12/2022   Editor: [name], MD (Physician)                                    HPI: Follow-up hypertension and atrial fibrillation. Also history of TIA. Nuclear study March 2017 showed ejection fraction 62% and no ischemia.  Carotid Dopplers May 2020 showed 1 to 39% bilateral stenosis. Question episodes of syncope at previous ov. Monitor 4/21 showed sinus with PACs, PVCs brief PAT but no pauses.  Found to be in atrial fibrillation at office visit May 30, 2021.  Apixaban was initiated with plans for cardioversion if atrial fibrillation persisted as he did complain of increased fatigue. Echocardiogram repeated January 2023 and showed normal LV function, mild left ventricular hypertrophy, mild right ventricular enlargement, mild left atrial enlargement, mild to moderate  tricuspid regurgitation, mildly dilated ascending aorta, moderate aortic stenosis with mean gradient 12 mmHg, aortic valve area 1.5 cm and dimensionless index 0.3.  TEE February 2023 showed no left atrial appendage thrombus, ejection fraction 55 to 60%, mild biatrial enlargement, mild mitral regurgitation and moderate aortic stenosis.  Patient had successful cardioversion to normal sinus rhythm.  Atrial fibrillation recurred.  Amiodarone initiated and he had repeat cardioversion December 15 2021.  Since last seen, following his last cardioversion he initially felt extremely well for 2 weeks with much more energy.  However in the past 1 to 2 weeks he notes decreased energy again.  There is no dyspnea, chest pain, palpitations, syncope or bleeding.              Current Outpatient Medications  Medication Sig Dispense Refill   amiodarone (PACERONE) 200 MG tablet Take 1 tablet (200 mg total) by mouth daily. 90 tablet 3   apixaban (ELIQUIS) 2.5 MG TABS tablet Take 1 tablet (2.5 mg total) by mouth 2 (two) times daily. 60 tablet     Bevacizumab (AVASTIN IV) Place 1 Syringe into the right eye See admin  instructions. 1 dose injected in the right eye every 7 weeks       famotidine (PEPCID) 20 MG tablet Take 1 tablet (20 mg total) by mouth 2 (two) times daily. (Patient taking differently: Take 20 mg by mouth in the morning.) 180 tablet 3   glucose blood (ONE TOUCH ULTRA TEST) test strip Use as directed once daily to check blood sugar.  Diagnosis code E11.42 100 each 11   latanoprost (XALATAN) 0.005 % ophthalmic solution Place 1 drop into both eyes at bedtime. 2.5 mL 12   levothyroxine (SYNTHROID) 125 MCG tablet TAKE 1 TABLET EVERY DAY BEFORE BREAKFAST (Patient taking differently: Take 125 mcg by mouth daily before breakfast.) 90 tablet 3   lidocaine (LIDODERM) 5 % Place 1-3 patches onto the skin daily. Remove & Discard patch within 12 hours or as directed by MD 30 patch 0   melatonin 3 MG TABS tablet Take 1 tablet (3 mg total) by mouth at bedtime.   0   metFORMIN (GLUCOPHAGE) 1000 MG tablet TAKE 1 TABLET EVERY DAY (Patient taking differently: Take 1,000 mg by mouth daily with breakfast.) 90 tablet 0   metoprolol succinate (TOPROL-XL) 25 MG 24 hr tablet Take 1 tablet (25 mg total) by mouth every morning. 90 tablet 3   Multiple Vitamins-Minerals (ICAPS AREDS 2 PO) Take 2 capsules by mouth daily.        No current facility-administered medications for this visit.               Past Medical History:  Diagnosis Date   Anxiety     B12 deficiency 09/23/2016   Blood transfusion without reported diagnosis     Cancer ([place])      basal and squamous skin carcinoma    Cataract     Depression     Diabetes mellitus     GERD (gastroesophageal reflux disease)     Glaucoma 01/04/2013   Hx of transient ischemic attack (TIA) 01/04/2013   Hx: UTI (urinary tract infection)     Hyperlipidemia     Hypertension     Hypothyroidism     Insomnia     Macular degeneration     Neuropathy     OA (osteoarthritis)     Pneumonia     PVC's (premature ventricular contractions)     Renal mass  Stroke [facility])       mid 1990s                Past Surgical History:  Procedure Laterality Date   APPENDECTOMY       CARDIOVERSION N/A 07/30/2021    Procedure: CARDIOVERSION;  Surgeon: Josue Hector, MD;  Location: [place];  Service: Cardiovascular;  Laterality: N/A;   CARDIOVERSION N/A 12/15/2021    Procedure: CARDIOVERSION;  Surgeon: Jerline Pain, MD;  Location: [place];  Service: Cardiovascular;  Laterality: N/A;   CATARACT EXTRACTION W/ INTRAOCULAR LENS  IMPLANT, BILATERAL       INGUINAL HERNIA REPAIR        X3   IR KYPHO LUMBAR INC FX REDUCE BONE BX UNI/BIL CANNULATION INC/IMAGING   11/04/2021   LUMBAR LAMINECTOMY/DECOMPRESSION MICRODISCECTOMY N/A 02/12/2017    Procedure: Lumbar Three-Four, Lumbar Four-Five, Lumbar Five-Sacral One Laminectomy and Foraminotomy;  Surgeon: Earnie Larsson, MD;  Location: [place];  Service: Neurosurgery;  Laterality: N/A;   OTHER SURGICAL HISTORY        microwave procedure for prostate - 09/12    PROSTATE ABLATION        microwave   REPLACEMENT TOTAL KNEE       THYROIDECTOMY       TONSILLECTOMY AND ADENOIDECTOMY       TOTAL KNEE ARTHROPLASTY   05/11/2011    Procedure: TOTAL KNEE ARTHROPLASTY;  Surgeon: Gearlean Alf;  Location: [facility];  Service: Orthopedics;  Laterality: Right;   US ECHOCARDIOGRAPHY   11/10/2006    EF 55-60%   US ECHOCARDIOGRAPHY   09/20/2002    EF 65-70%      Social History             Socioeconomic History   Marital status: Married      Spouse name: Not on file   Number of children: 3   Years of education: Not on file   Highest education level: Not on file  Occupational History   Occupation: retired      Fish farm manager: RETIRED      Comment: [employer]  Tobacco Use   Smoking status: Never   Smokeless tobacco: Never  Vaping Use   Vaping Use: Never used  Substance and Sexual Activity   Alcohol use: Yes      Alcohol/week: 7.0 standard drinks of alcohol      Types: 7 Shots of liquor per week      Comment: 2 drinks most  days   Drug use: No   Sexual activity: Never      Birth control/protection: None  Other Topics Concern   Not on file  Social History Narrative    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, 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[employer]         Home Situation: living with wife         Spiritual Beliefs: episcopalian         Lifestyle: walks daily, working on diet                   Social Determinants of Health           Financial Resource Strain: Low Risk  (09/10/2020)    Overall Financial Resource Strain (CARDIA)     Difficulty of Paying Living Expenses: Not hard at all  Food Insecurity: No Food Insecurity (09/10/2020)    Hunger Vital Sign     Worried About [place] in the Last Year: Never  true     Ran Out of Food in the Last Year: Never true  Transportation Needs: No Transportation Needs (09/10/2020)    PRAPARE - Armed forces logistics/support/administrative officer (Medical): No     Lack of Transportation (Non-Medical): No  Physical Activity: Inactive (09/10/2020)    Exercise Vital Sign     Days of Exercise per Week: 0 days     Minutes of Exercise per Session: 0 min  Stress: No Stress Concern Present (09/10/2020)    [place]     Feeling of Stress : Not at all  Social Connections: Not on file  Intimate Partner Violence: Not on file               Family History  Problem Relation Age of Onset   Hypertension Mother     Arthritis Mother     Diabetes Mother     Hypertension Father     Stroke Father     Kidney disease Father     Hypertension Brother     Diabetes Brother        ROS: no fevers or chills, productive cough, hemoptysis, dysphasia, odynophagia, melena, hematochezia, dysuria, hematuria, rash, seizure activity, orthopnea, PND, pedal edema, claudication. Remaining systems are negative.   Physical Exam: Well-developed well-nourished in no acute distress.  Skin is warm and dry.  HEENT is normal.  Neck is supple.  Chest is  clear to auscultation with normal expansion.  Cardiovascular exam is irregular, 2/6 systolic murmur left sternal border.  S2 is not diminished. Abdominal exam nontender or distended. No masses palpated. Extremities show no edema. neuro grossly intact   ECG-atrial flutter, left axis deviation, septal infarct.  Personally reviewed   A/P   1 paroxysmal atrial fibrillation-patient has developed recurrent atrial arrhythmias (is in atrial flutter today).  He is symptomatic with decreased energy.  Not sure that he would be a candidate for aggressive procedures such as ablation.  I have discussed the options and he would like to try to reestablish sinus.  I will increase amiodarone to 200 mg twice daily for 6 weeks then resume 200 mg daily.  Repeat cardioversion in 8 weeks.  I am hopeful that he will remain in sinus rhythm has more amiodarone is in his system.  If not we may need to continue with rate control with the understanding his symptoms may not improve.  We will recheck hemoglobin and renal function.  If his creatinine is less than 1.5 will increase apixaban to 5 mg twice daily.  I would ultimately like to discontinue apixaban if we can reestablish sinus rhythm as he has had falls in the past and the risk of anticoagulation is increased.     2 aortic stenosis-moderate on most recent echocardiogram.  He will require follow-up studies in the future and likely TAVR.  Repeat echocardiogram January 2024.   3 hypertension-blood pressure controlled.  Continue present medical regimen and follow.   4 history of TIA-continue apixaban for now.   5 hyperlipidemia-Per primary care   Kirk Ruths, MD       As above; patient seen and examined; mild dyspnea but no chest pain; PMH, social history, family history, ROS unchanged;  Physical exam; CV irregular, chest CTA, ext no edema, neuro grossly intact. Patient states he may have missed apixaban doses; will change to TEE guided DCCV.   Kirk Ruths

## 2022-04-06 ENCOUNTER — Other Ambulatory Visit: Payer: Self-pay | Admitting: Internal Medicine

## 2022-04-06 DIAGNOSIS — H52203 Unspecified astigmatism, bilateral: Secondary | ICD-10-CM | POA: Diagnosis not present

## 2022-04-06 DIAGNOSIS — H40053 Ocular hypertension, bilateral: Secondary | ICD-10-CM | POA: Diagnosis not present

## 2022-04-06 DIAGNOSIS — Z961 Presence of intraocular lens: Secondary | ICD-10-CM | POA: Diagnosis not present

## 2022-04-06 DIAGNOSIS — E119 Type 2 diabetes mellitus without complications: Secondary | ICD-10-CM | POA: Diagnosis not present

## 2022-04-06 NOTE — Telephone Encounter (Signed)
Please refill as per office routine med refill policy (all routine meds to be refilled for 3 mo or monthly (per pt preference) up to one year from last visit, then month to month grace period for 3 mo, then further med refills will have to be denied) ? ?

## 2022-04-13 NOTE — Progress Notes (Signed)
HPI: Follow-up hypertension and atrial fibrillation. Also history of TIA. Nuclear study March 2017 showed ejection fraction 62% and no ischemia.  Carotid Dopplers May 2020 showed 1 to 39% bilateral stenosis. Question episodes of syncope at previous ov. Monitor 4/21 showed sinus with PACs, PVCs brief PAT but no pauses.  Found to be in atrial fibrillation at office visit May 30, 2021. Echocardiogram repeated January 2023 and showed normal LV function, mild left ventricular hypertrophy, mild right ventricular enlargement, mild left atrial enlargement, mild to moderate tricuspid regurgitation, mildly dilated ascending aorta, moderate aortic stenosis with mean gradient 12 mmHg, aortic valve area 1.5 cm and dimensionless index 0.3. Transesophageal echocardiogram March 10, 2022 showed normal LV function, moderate aortic stenosis with mean gradient 20 mmHg, moderate left atrial enlargement, mild to moderate mitral regurgitation.  Patient had successful cardioversion on amiodarone at that time.  Since last seen, patient describes fatigue.  He continues to fall.  He denies increased dyspnea, chest pain or syncope.  He merely loses his balance.  Current Outpatient Medications  Medication Sig Dispense Refill   amiodarone (PACERONE) 200 MG tablet Take 1 tablet (200 mg total) by mouth daily. 90 tablet 3   apixaban (ELIQUIS) 5 MG TABS tablet Take 1 tablet (5 mg total) by mouth 2 (two) times daily.     Bevacizumab (AVASTIN IV) Place 1 Syringe into the right eye See admin instructions. 1 dose injected in the right eye every 7 weeks     famotidine (PEPCID) 20 MG tablet Take 1 tablet (20 mg total) by mouth 2 (two) times daily. (Patient taking differently: Take 20 mg by mouth in the morning.) 180 tablet 3   glucose blood (ONE TOUCH ULTRA TEST) test strip Use as directed once daily to check blood sugar.  Diagnosis code E11.42 100 each 11   latanoprost (XALATAN) 0.005 % ophthalmic solution SMARTSIG:In Eye(s)      levothyroxine (SYNTHROID) 125 MCG tablet TAKE 1 TABLET EVERY DAY BEFORE BREAKFAST (Patient taking differently: Take 125 mcg by mouth daily before breakfast.) 90 tablet 3   lidocaine (LIDODERM) 5 % Place 1-3 patches onto the skin daily. Remove & Discard patch within 12 hours or as directed by MD 30 patch 0   melatonin 3 MG TABS tablet Take 1 tablet (3 mg total) by mouth at bedtime.  0   metFORMIN (GLUCOPHAGE) 1000 MG tablet Take 1 tablet (1,000 mg total) by mouth daily. 90 tablet 10   metoprolol succinate (TOPROL-XL) 25 MG 24 hr tablet Take 1 tablet (25 mg total) by mouth every morning. 90 tablet 3   Multiple Vitamins-Minerals (ICAPS AREDS 2 PO) Take 2 capsules by mouth daily.     No current facility-administered medications for this visit.     Past Medical History:  Diagnosis Date   Anxiety    B12 deficiency 09/23/2016   Blood transfusion without reported diagnosis    Cancer ([place])    basal and squamous skin carcinoma    Cataract    Depression    Diabetes mellitus    GERD (gastroesophageal reflux disease)    Glaucoma 01/04/2013   Hx of transient ischemic attack (TIA) 01/04/2013   Hx: UTI (urinary tract infection)    Hyperlipidemia    Hypertension    Hypothyroidism    Insomnia    Macular degeneration    Neuropathy    OA (osteoarthritis)    Pneumonia    PVC's (premature ventricular contractions)    Renal mass    Stroke ([place])  mid 1990s     Past Surgical History:  Procedure Laterality Date   APPENDECTOMY     CARDIOVERSION N/A 07/30/2021   Procedure: CARDIOVERSION;  Surgeon: Josue Hector, MD;  Location: [place];  Service: Cardiovascular;  Laterality: N/A;   CARDIOVERSION N/A 12/15/2021   Procedure: CARDIOVERSION;  Surgeon: Jerline Pain, MD;  Location: [place];  Service: Cardiovascular;  Laterality: N/A;   CARDIOVERSION N/A 03/10/2022   Procedure: CARDIOVERSION;  Surgeon: [name], MD;  Location: [facility];  Service: Cardiovascular;  Laterality:  N/A;   CATARACT EXTRACTION W/ INTRAOCULAR LENS  IMPLANT, BILATERAL     INGUINAL HERNIA REPAIR     X3   IR KYPHO LUMBAR INC FX REDUCE BONE BX UNI/BIL CANNULATION INC/IMAGING  11/04/2021   LUMBAR LAMINECTOMY/DECOMPRESSION MICRODISCECTOMY N/A 02/12/2017   Procedure: Lumbar Three-Four, Lumbar Four-Five, Lumbar Five-Sacral One Laminectomy and Foraminotomy;  Surgeon: Earnie Larsson, MD;  Location: [place];  Service: Neurosurgery;  Laterality: N/A;   OTHER SURGICAL HISTORY     microwave procedure for prostate - 09/12    PROSTATE ABLATION     microwave   REPLACEMENT TOTAL KNEE     TEE WITHOUT CARDIOVERSION N/A 03/10/2022   Procedure: TRANSESOPHAGEAL ECHOCARDIOGRAM (TEE);  Surgeon: [name], MD;  Location: [place];  Service: Cardiovascular;  Laterality: N/A;   THYROIDECTOMY     TONSILLECTOMY AND ADENOIDECTOMY     TOTAL KNEE ARTHROPLASTY  05/11/2011   Procedure: TOTAL KNEE ARTHROPLASTY;  Surgeon: Gearlean Alf;  [facility];  Service: Orthopedics;  Laterality: Right;   US ECHOCARDIOGRAPHY  11/10/2006   EF 55-60%   US ECHOCARDIOGRAPHY  09/20/2002   EF 65-70%    Social History   Socioeconomic History   Marital status: Married    Spouse name: Not on file   Number of children: 3   Years of education: Not on file   Highest education level: Not on file  Occupational History   Occupation: retired    Fish farm manager: RETIRED    Comment: [employer]  Tobacco Use   Smoking status: Never   Smokeless tobacco: Never  Vaping Use   Vaping Use: Never used  Substance and Sexual Activity   Alcohol use: Yes    Alcohol/week: 7.0 standard drinks of alcohol    Types: 7 Shots of liquor per week    Comment: 2 drinks most days   Drug use: No   Sexual activity: Never    Birth control/protection: None  Other Topics Concern   Not on file  Social History Narrative   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, 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[employer]      Home Situation: living with wife      Spiritual  Beliefs: episcopalian      Lifestyle: walks daily, working on diet            Social Determinants of Health   Financial Resource Strain: Low Risk  (09/10/2020)   Overall Financial Resource Strain (CARDIA)    Difficulty of Paying Living Expenses: Not hard at all  Food Insecurity: No Food Insecurity (09/10/2020)   Hunger Vital Sign    Worried About [place] in the Last Year: Never true    [place] in the Last Year: Never true  Transportation Needs: No Transportation Needs (09/10/2020)   PRAPARE - Hydrologist (Medical): No    Lack of Transportation (Non-Medical): No  Physical Activity: Inactive (09/10/2020)  Exercise Vital Sign    Days of Exercise per Week: 0 days    Minutes of Exercise per Session: 0 min  Stress: No Stress Concern Present (09/10/2020)   [place]    Feeling of Stress : Not at all  Social Connections: Not on file  Intimate Partner Violence: Not on file    Family History  Problem Relation Age of Onset   Hypertension Mother    Arthritis Mother    Diabetes Mother    Hypertension Father    Stroke Father    Kidney disease Father    Hypertension Brother    Diabetes Brother     ROS: no fevers or chills, productive cough, hemoptysis, dysphasia, odynophagia, melena, hematochezia, dysuria, hematuria, rash, seizure activity, orthopnea, PND, pedal edema, claudication. Remaining systems are negative.  Physical Exam: Well-developed well-nourished in no acute distress.  Skin is warm and dry.  HEENT is normal.  Neck is supple.  Chest is clear to auscultation with normal expansion.  Cardiovascular exam is regular rate and rhythm.  3/6 systolic murmur left sternal border. Abdominal exam nontender or distended. No masses palpated. Extremities show no edema. neuro grossly intact  ECG-sinus bradycardia at a rate of 53, left axis deviation, left ventricular  hypertrophy, cannot rule out septal infarct, first-degree AV block.  Personally reviewed  A/P  1 paroxysmal atrial fibrillation-patient remains in sinus rhythm.  Continue amiodarone at present dose.  He is describing fatigue and I wonder if Toprol could be contributing.  I will discontinue this medication and follow.  He continues to have difficulties falling.  His cardioversion was greater than 4 weeks ago.  I am hesitant to continue this long-term as I feel risk outweighs benefit given recurrent falls.  We will discontinue.  I explained that his risk of stroke is higher off of this medication but I feel at this point this is the best option.  2 aortic stenosis-moderate on most recent echocardiogram.  Will plan to repeat study September 2024 or sooner if necessary.  We discussed that he will likely require TAVR in the future.  3 hypertension-patient's blood pressure is controlled.  I am discontinuing Toprol to see if this improves his fatigue.  4 prior TIA-patient has continued on anticoagulation.  However he is having difficulties with memory and continues to have frequent falls.  Now that he remains in sinus rhythm I feel we should discontinue apixaban as risk outweighs benefit.  5 hyperlipidemia-continue diet.  Kirk Ruths, MD

## 2022-04-20 ENCOUNTER — Encounter: Payer: Self-pay | Admitting: Cardiology

## 2022-04-20 ENCOUNTER — Ambulatory Visit: Payer: Medicare Other | Attending: Cardiology | Admitting: Cardiology

## 2022-04-20 VITALS — BP 138/80 | HR 53 | Ht 73.0 in | Wt 164.8 lb

## 2022-04-20 DIAGNOSIS — E7800 Pure hypercholesterolemia, unspecified: Secondary | ICD-10-CM | POA: Diagnosis not present

## 2022-04-20 DIAGNOSIS — I480 Paroxysmal atrial fibrillation: Secondary | ICD-10-CM | POA: Diagnosis not present

## 2022-04-20 DIAGNOSIS — I350 Nonrheumatic aortic (valve) stenosis: Secondary | ICD-10-CM | POA: Insufficient documentation

## 2022-04-20 DIAGNOSIS — I119 Hypertensive heart disease without heart failure: Secondary | ICD-10-CM | POA: Insufficient documentation

## 2022-04-20 NOTE — Patient Instructions (Signed)
Medication Instructions:   STOP ELIQUIS  STOP METOPROLOL  *If you need a refill on your cardiac medications before your next appointment, please call your pharmacy*   Follow-Up: At [facility], you and your health needs are our priority.  As part of our continuing mission to provide you with exceptional heart care, we have created designated Provider Care Teams.  These Care Teams include your primary Cardiologist (physician) and Advanced Practice Providers (APPs -  Physician Assistants and Nurse Practitioners) who all work together to provide you with the care you need, when you need it.  We recommend signing up for the patient portal called "MyChart".  Sign up information is provided on this After Visit Summary.  MyChart is used to connect with patients for Virtual Visits (Telemedicine).  Patients are able to view lab/test results, encounter notes, upcoming appointments, etc.  Non-urgent messages can be sent to your provider as well.   To learn more about what you can do with MyChart, go to [email or web address].    Your next appointment:   6 month(s)  The format for your next appointment:   In Person  Provider:   Kirk Ruths, MD

## 2022-04-22 ENCOUNTER — Encounter (INDEPENDENT_AMBULATORY_CARE_PROVIDER_SITE_OTHER): Payer: Medicare Other | Admitting: Ophthalmology

## 2022-04-22 DIAGNOSIS — H35372 Puckering of macula, left eye: Secondary | ICD-10-CM | POA: Diagnosis not present

## 2022-04-22 DIAGNOSIS — H43391 Other vitreous opacities, right eye: Secondary | ICD-10-CM | POA: Diagnosis not present

## 2022-04-22 DIAGNOSIS — H353113 Nonexudative age-related macular degeneration, right eye, advanced atrophic without subfoveal involvement: Secondary | ICD-10-CM | POA: Diagnosis not present

## 2022-04-22 DIAGNOSIS — H353124 Nonexudative age-related macular degeneration, left eye, advanced atrophic with subfoveal involvement: Secondary | ICD-10-CM | POA: Diagnosis not present

## 2022-04-22 DIAGNOSIS — H353211 Exudative age-related macular degeneration, right eye, with active choroidal neovascularization: Secondary | ICD-10-CM | POA: Diagnosis not present

## 2022-04-28 ENCOUNTER — Ambulatory Visit (INDEPENDENT_AMBULATORY_CARE_PROVIDER_SITE_OTHER): Payer: Medicare Other | Admitting: Podiatry

## 2022-04-28 DIAGNOSIS — B351 Tinea unguium: Secondary | ICD-10-CM | POA: Diagnosis not present

## 2022-04-28 DIAGNOSIS — M79676 Pain in unspecified toe(s): Secondary | ICD-10-CM

## 2022-04-28 NOTE — Progress Notes (Unsigned)
Subjective: 88 y.o. returns the office today for painful, elongated, thickened toenails which he cannot trim himself. Denies any redness or drainage around the nails.  No other lower extremity complaints today.  He is no longer on Eliquis   PCP: Biagio Borg, MD A1c: 7.4 on November 28, 2021  Objective: NAD DP/PT pulses palpable, CRT less than 3 seconds Nails hypertrophic, dystrophic, elongated, brittle, discolored 10. There is tenderness overlying the nails 1-5 bilaterally. There is no surrounding erythema or drainage along the nail sites. Hyperkeratotic lesions noted along the right heel, right hallux without any underlying ulceration drainage or signs of infection.  No open lesions. Dry skin No open lesions or pre-ulcerative lesions are identified. No pain with calf compression, swelling, warmth, erythema.  Assessment: Patient presents with symptomatic onychomycosis  Plan: -Treatment options including alternatives, risks, complications were discussed -Nails sharply debrided 10 without complication/bleeding. -Hyperkeratotic lesion sharp debrided x2 without any complications or bleeding. -Recommend moisturizer daily but do not apply interdigitally. -Discussed daily foot inspection. If there are any changes, to call the office immediately.  -Follow-up in 3 months or sooner if any problems are to arise. In the meantime, encouraged to call the office with any questions, concerns, changes symptoms.  Celesta Gentile, DPM

## 2022-05-20 DIAGNOSIS — W19XXXA Unspecified fall, initial encounter: Secondary | ICD-10-CM | POA: Diagnosis not present

## 2022-05-20 DIAGNOSIS — I10 Essential (primary) hypertension: Secondary | ICD-10-CM | POA: Diagnosis not present

## 2022-05-20 DIAGNOSIS — I959 Hypotension, unspecified: Secondary | ICD-10-CM | POA: Diagnosis not present

## 2022-05-27 ENCOUNTER — Ambulatory Visit (INDEPENDENT_AMBULATORY_CARE_PROVIDER_SITE_OTHER): Payer: Medicare Other | Admitting: Internal Medicine

## 2022-05-27 VITALS — BP 140/80 | HR 79 | Temp 98.5°F | Ht 73.0 in | Wt 167.0 lb

## 2022-05-27 DIAGNOSIS — R269 Unspecified abnormalities of gait and mobility: Secondary | ICD-10-CM | POA: Diagnosis not present

## 2022-05-27 DIAGNOSIS — Z23 Encounter for immunization: Secondary | ICD-10-CM

## 2022-05-27 DIAGNOSIS — E559 Vitamin D deficiency, unspecified: Secondary | ICD-10-CM | POA: Diagnosis not present

## 2022-05-27 DIAGNOSIS — E538 Deficiency of other specified B group vitamins: Secondary | ICD-10-CM

## 2022-05-27 DIAGNOSIS — I350 Nonrheumatic aortic (valve) stenosis: Secondary | ICD-10-CM

## 2022-05-27 DIAGNOSIS — R2689 Other abnormalities of gait and mobility: Secondary | ICD-10-CM

## 2022-05-27 DIAGNOSIS — R296 Repeated falls: Secondary | ICD-10-CM

## 2022-05-27 DIAGNOSIS — E1142 Type 2 diabetes mellitus with diabetic polyneuropathy: Secondary | ICD-10-CM | POA: Diagnosis not present

## 2022-05-27 DIAGNOSIS — I482 Chronic atrial fibrillation, unspecified: Secondary | ICD-10-CM | POA: Diagnosis not present

## 2022-05-27 LAB — BASIC METABOLIC PANEL
BUN: 26 mg/dL — ABNORMAL HIGH (ref 6–23)
CO2: 28 mEq/L (ref 19–32)
Calcium: 9.7 mg/dL (ref 8.4–10.5)
Chloride: 102 mEq/L (ref 96–112)
Creatinine, Ser: 1.52 mg/dL — ABNORMAL HIGH (ref 0.40–1.50)
GFR: 39.72 mL/min — ABNORMAL LOW (ref >60.00)
Glucose, Bld: 155 mg/dL — ABNORMAL HIGH (ref 70–99)
Potassium: 4.6 mEq/L (ref 3.5–5.1)
Sodium: 138 mEq/L (ref 135–145)

## 2022-05-27 LAB — TSH: TSH: 6.32 u[IU]/mL — ABNORMAL HIGH (ref 0.35–5.50)

## 2022-05-27 LAB — HEPATIC FUNCTION PANEL
ALT: 13 U/L (ref 0–53)
AST: 15 U/L (ref 0–37)
Albumin: 4.4 g/dL (ref 3.5–5.2)
Alkaline Phosphatase: 92 U/L (ref 39–117)
Bilirubin, Direct: 0.2 mg/dL (ref 0.0–0.3)
Total Bilirubin: 0.5 mg/dL (ref 0.2–1.2)
Total Protein: 7.7 g/dL (ref 6.0–8.3)

## 2022-05-27 LAB — CBC WITH DIFFERENTIAL/PLATELET
Basophils Absolute: 0 10*3/uL (ref 0.0–0.1)
Basophils Relative: 0.5 % (ref 0.0–3.0)
Eosinophils Absolute: 0 10*3/uL (ref 0.0–0.7)
Eosinophils Relative: 0.2 % (ref 0.0–5.0)
HCT: 41 % (ref 39.0–52.0)
Hemoglobin: 13.8 g/dL (ref 13.0–17.0)
Lymphocytes Relative: 10.6 % — ABNORMAL LOW (ref 12.0–46.0)
Lymphs Abs: 0.9 10*3/uL (ref 0.7–4.0)
MCHC: 33.7 g/dL (ref 30.0–36.0)
MCV: 96.4 fl (ref 78.0–100.0)
Monocytes Absolute: 0.6 10*3/uL (ref 0.1–1.0)
Monocytes Relative: 6.7 % (ref 3.0–12.0)
Neutro Abs: 6.9 10*3/uL (ref 1.4–7.7)
Neutrophils Relative %: 82 % — ABNORMAL HIGH (ref 43.0–77.0)
Platelets: 319 10*3/uL (ref 150.0–400.0)
RBC: 4.25 Mil/uL (ref 4.22–5.81)
RDW: 14.8 % (ref 11.5–15.5)
WBC: 8.4 10*3/uL (ref 4.0–10.5)

## 2022-05-27 LAB — LIPID PANEL
Cholesterol: 191 mg/dL (ref 0–200)
HDL: 69.9 mg/dL (ref >39.00)
LDL Cholesterol: 111 mg/dL — ABNORMAL HIGH (ref 0–99)
NonHDL: 120.94
Total CHOL/HDL Ratio: 3
Triglycerides: 50 mg/dL (ref 0.0–149.0)
VLDL: 10 mg/dL (ref 0.0–40.0)

## 2022-05-27 LAB — VITAMIN D 25 HYDROXY (VIT D DEFICIENCY, FRACTURES): VITD: 88.41 ng/mL (ref 30.00–100.00)

## 2022-05-27 LAB — HEMOGLOBIN A1C: Hgb A1c MFr Bld: 6.7 % — ABNORMAL HIGH (ref 4.6–6.5)

## 2022-05-27 LAB — VITAMIN B12: Vitamin B-12: 362 pg/mL (ref 211–911)

## 2022-05-27 MED ORDER — METFORMIN HCL 1000 MG PO TABS: 1000.0000 mg | ORAL_TABLET | Freq: Every day | ORAL | 3 refills | Status: DC

## 2022-05-27 NOTE — Progress Notes (Addendum)
Patient ID: Stephen Wilkinson, male   DOB: July 08, 1928, 88 y.o.   MRN: [id number]        Chief Complaint: follow up PAF, recurrent falls and generalized weakness, gait disorder, anxiety       HPI:  Stephen Wilkinson is a 88 y.o. male here with wife, c/o 2 mo worsening gait difficutly and higher risk of falls, though not yet falling.  Balance worse and generalized weakness, tends to spend most time sitting recently. Needs rollater due to balance disorder and weakness for safety.   Denies worsening depressive symptoms, suicidal ideation, or panic; has ongoing anxiety, increased recently situationally it seems. Fears he is going to die soon.   No longer taking eliquis per cardiology due to worsening fall risk.  Wife asks for PT at home for him.  Pt denies chest pain, increased sob or doe, wheezing, orthopnea, PND, increased LE swelling, palpitations, dizziness or syncope.  Pt denies polydipsia, polyuria, or new focal neuro s/s. .  Wt Readings from Last 3 Encounters:  05/27/22 167 lb (75.8 kg)  04/20/22 164 lb 12.8 oz (74.8 kg)  03/10/22 166 lb (75.3 kg)   BP Readings from Last 3 Encounters:  05/27/22 (!) 140/80  04/20/22 138/80  03/10/22 129/61         Past Medical History:  Diagnosis Date   Anxiety    B12 deficiency 09/23/2016   Blood transfusion without reported diagnosis    Cancer (HCC)    basal and squamous skin carcinoma    Cataract    Depression    Diabetes mellitus    GERD (gastroesophageal reflux disease)    Glaucoma 01/04/2013   Hx of transient ischemic attack (TIA) 01/04/2013   Hx: UTI (urinary tract infection)    Hyperlipidemia    Hypertension    Hypothyroidism    Insomnia    Macular degeneration    Neuropathy    OA (osteoarthritis)    Pneumonia    PVC's (premature ventricular contractions)    Renal mass    Stroke (HCC)    mid 1990s    Past Surgical History:  Procedure Laterality Date   APPENDECTOMY     CARDIOVERSION N/A 07/30/2021   Procedure: CARDIOVERSION;  Surgeon: Wendall Stade, MD;  Location: [facility] ENDOSCOPY;  Service: Cardiovascular;  Laterality: N/A;   CARDIOVERSION N/A 12/15/2021   Procedure: CARDIOVERSION;  Surgeon: Jake Bathe, MD;  Location: [facility] ENDOSCOPY;  Service: Cardiovascular;  Laterality: N/A;   CARDIOVERSION N/A 03/10/2022   Procedure: CARDIOVERSION;  Surgeon: Lewayne Bunting, MD;  Location: [facility] ENDOSCOPY;  Service: Cardiovascular;  Laterality: N/A;   CATARACT EXTRACTION W/ INTRAOCULAR LENS  IMPLANT, BILATERAL     INGUINAL HERNIA REPAIR     X3   IR KYPHO LUMBAR INC FX REDUCE BONE BX UNI/BIL CANNULATION INC/IMAGING  11/04/2021   LUMBAR LAMINECTOMY/DECOMPRESSION MICRODISCECTOMY N/A 02/12/2017   Procedure: Lumbar Three-Four, Lumbar Four-Five, Lumbar Five-Sacral One Laminectomy and Foraminotomy;  Surgeon: Julio Sicks, MD;  Location: [facility] OR;  Service: Neurosurgery;  Laterality: N/A;   OTHER SURGICAL HISTORY     microwave procedure for prostate - 09/12    PROSTATE ABLATION     microwave   REPLACEMENT TOTAL KNEE     TEE WITHOUT CARDIOVERSION N/A 03/10/2022   Procedure: TRANSESOPHAGEAL ECHOCARDIOGRAM (TEE);  Surgeon: Lewayne Bunting, MD;  Location: [facility] ENDOSCOPY;  Service: Cardiovascular;  Laterality: N/A;   THYROIDECTOMY     TONSILLECTOMY AND ADENOIDECTOMY     TOTAL KNEE ARTHROPLASTY  05/11/2011   Procedure: TOTAL  KNEE ARTHROPLASTY;  Surgeon: Loanne Drilling;  Location: [facility] ORS;  Service: Orthopedics;  Laterality: Right;   US ECHOCARDIOGRAPHY  11/10/2006   EF 55-60%   US ECHOCARDIOGRAPHY  09/20/2002   EF 65-70%    reports that he has never smoked. He has never used smokeless tobacco. He reports current alcohol use of about 7.0 standard drinks of alcohol per week. He reports that he does not use drugs. family history includes Arthritis in his mother; Diabetes in his brother and mother; Hypertension in his brother, father, and mother; Kidney disease in his father; Stroke in his father. No Known Allergies Current Outpatient Medications on File Prior to Visit   Medication Sig Dispense Refill   amiodarone (PACERONE) 200 MG tablet Take 1 tablet (200 mg total) by mouth daily. 90 tablet 3   Bevacizumab (AVASTIN IV) Place 1 Syringe into the right eye See admin instructions. 1 dose injected in the right eye every 7 weeks     famotidine (PEPCID) 20 MG tablet Take 1 tablet (20 mg total) by mouth 2 (two) times daily. (Patient taking differently: Take 20 mg by mouth in the morning.) 180 tablet 3   glucose blood (ONE TOUCH ULTRA TEST) test strip Use as directed once daily to check blood sugar.  Diagnosis code E11.42 100 each 11   latanoprost (XALATAN) 0.005 % ophthalmic solution SMARTSIG:In Eye(s)     levothyroxine (SYNTHROID) 125 MCG tablet TAKE 1 TABLET EVERY DAY BEFORE BREAKFAST (Patient taking differently: Take 125 mcg by mouth daily before breakfast.) 90 tablet 3   Multiple Vitamins-Minerals (ICAPS AREDS 2 PO) Take 2 capsules by mouth daily.     No current facility-administered medications on file prior to visit.        ROS:  All others reviewed and negative.  Objective        PE:  BP (!) 140/80 (BP Location: Left Arm, Patient Position: Sitting, Cuff Size: Normal)   Pulse 79   Temp 98.5 F (36.9 C) (Oral)   Ht 6\' 1"  (1.854 m)   Wt 167 lb (75.8 kg)   SpO2 99%   BMI 22.03 kg/m                 Constitutional: Pt appears in NAD               HENT: Head: NCAT.                Right Ear: External ear normal.                 Left Ear: External ear normal.                Eyes: . Pupils are equal, round, and reactive to light. Conjunctivae and EOM are normal               Nose: without d/c or deformity               Neck: Neck supple. Gross normal ROM               Cardiovascular: Normal rate and regular rhythm.                 Pulmonary/Chest: Effort normal and breath sounds without rales or wheezing.                Abd:  Soft, NT, ND, + BS, no organomegaly               Neurological: Pt is  alert. At baseline orientation, motor grossly intact                Skin: Skin is warm. No rashes, no other new lesions, LE edema - 2-3+ nervous               Psychiatric: Pt behavior is normal without agitation   Micro: none  Cardiac tracings I have personally interpreted today:  none  Pertinent Radiological findings (summarize): none   Lab Results  Component Value Date   WBC 8.4 05/27/2022   HGB 13.8 05/27/2022   HCT 41.0 05/27/2022   PLT 319.0 05/27/2022   GLUCOSE 155 (H) 05/27/2022   CHOL 191 05/27/2022   TRIG 50.0 05/27/2022   HDL 69.90 05/27/2022   LDLCALC 111 (H) 05/27/2022   ALT 13 05/27/2022   AST 15 05/27/2022   NA 138 05/27/2022   K 4.6 05/27/2022   CL 102 05/27/2022   CREATININE 1.52 (H) 05/27/2022   BUN 26 (H) 05/27/2022   CO2 28 05/27/2022   TSH 6.32 (H) 05/27/2022   INR 1.0 11/04/2021   HGBA1C 6.7 (H) 05/27/2022   MICROALBUR 0.8 10/09/2020   Assessment/Plan:  Stephen Wilkinson is a 36 y.o. White or Caucasian [1] male with  has a past medical history of Anxiety, B12 deficiency (09/23/2016), Blood transfusion without reported diagnosis, Cancer (HCC), Cataract, Depression, Diabetes mellitus, GERD (gastroesophageal reflux disease), Glaucoma (01/04/2013), transient ischemic attack (TIA) (01/04/2013), UTI (urinary tract infection), Hyperlipidemia, Hypertension, Hypothyroidism, Insomnia, Macular degeneration, Neuropathy, OA (osteoarthritis), Pneumonia, PVC's (premature ventricular contractions), Renal mass, and Stroke (HCC).  DM type 2 with diabetic peripheral neuropathy (HCC) Lab Results  Component Value Date   HGBA1C 6.7 (H) 05/27/2022   Stable, pt to continue current medical treatment metformin 1000 mg qam   Gait disorder Worsening with generalized weakness, high risk falling, for home PT and neurology referral  Balance disorder No falls but pt to cont walker use  Vitamin B 12 deficiency Lab Results  Component Value Date   VITAMINB12 362 05/27/2022   Stable, cont oral replacement - b12 1000 mcg qd   Aortic  stenosis Noted on recent echo, not likely to be related to symptoms it seems,  to f/u any worsening symptoms or concerns  Atrial fibrillation, chronic (HCC) Stable rate and volume, cont current med tx  Recurrent falls while walking Also for lab f/u today r/o other cause such as anemia  Vitamin D deficiency Last vitamin D Lab Results  Component Value Date   VD25OH 88.41 05/27/2022   Stable, cont oral replacement  Followup: Return in about 4 months (around 09/26/2022).  Oliver Barre, MD 05/29/2022 9:39 PM [facility] Internal [facility]

## 2022-05-27 NOTE — Patient Instructions (Addendum)
You had the flu shot today  Please continue all other medications as before, and refills have been done if requested.  Please have the pharmacy call with any other refills you may need.  Please continue your efforts at being more active, low cholesterol diet, and weight control.  You are otherwise up to date with prevention measures today.  Please keep your appointments with your specialists as you may have planned  You will be contacted regarding the referral for: Neurology, and Home Health with RN, PT, OT, and aide  Please go to the LAB at the blood drawing area for the tests to be done  You will be contacted by phone if any changes need to be made immediately.  Otherwise, you will receive a letter about your results with an explanation, but please check with MyChart first.  Please remember to sign up for MyChart if you have not done so, as this will be important to you in the future with finding out test results, communicating by private email, and scheduling acute appointments online when needed.  Please make an Appointment to return in 4 months, or sooner if needed

## 2022-05-29 ENCOUNTER — Encounter: Payer: Self-pay | Admitting: Internal Medicine

## 2022-05-29 NOTE — Assessment & Plan Note (Signed)
Lab Results  Component Value Date   HGBA1C 6.7 (H) 05/27/2022   Stable, pt to continue current medical treatment metformin 1000 mg qam

## 2022-05-29 NOTE — Assessment & Plan Note (Signed)
Worsening with generalized weakness, high risk falling, for home PT and neurology referral

## 2022-05-29 NOTE — Assessment & Plan Note (Signed)
Stable rate and volume, cont current med tx

## 2022-05-29 NOTE — Assessment & Plan Note (Signed)
Also for lab f/u today r/o other cause such as anemia

## 2022-05-29 NOTE — Assessment & Plan Note (Signed)
Noted on recent echo, not likely to be related to symptoms it seems,  to f/u any worsening symptoms or concerns

## 2022-05-29 NOTE — Assessment & Plan Note (Signed)
Lab Results  Component Value Date   VITAMINB12 362 05/27/2022   Stable, cont oral replacement - b12 1000 mcg qd

## 2022-05-29 NOTE — Assessment & Plan Note (Signed)
No falls but pt to cont walker use

## 2022-05-29 NOTE — Assessment & Plan Note (Signed)
Last vitamin D Lab Results  Component Value Date   VD25OH 88.41 05/27/2022   Stable, cont oral replacement

## 2022-06-03 ENCOUNTER — Telehealth: Payer: Self-pay | Admitting: Internal Medicine

## 2022-06-03 NOTE — Telephone Encounter (Signed)
[place]   Phone Number: [phone]  Needs Verbal orders for what service & frequency:Skilled nursing per patient's request - Every other week for 9 weeks.

## 2022-06-03 NOTE — Telephone Encounter (Signed)
Ok for verbals, thanks 

## 2022-06-10 DIAGNOSIS — H401131 Primary open-angle glaucoma, bilateral, mild stage: Secondary | ICD-10-CM | POA: Diagnosis not present

## 2022-06-10 DIAGNOSIS — H35372 Puckering of macula, left eye: Secondary | ICD-10-CM | POA: Diagnosis not present

## 2022-06-10 DIAGNOSIS — H43391 Other vitreous opacities, right eye: Secondary | ICD-10-CM | POA: Diagnosis not present

## 2022-06-10 DIAGNOSIS — H353124 Nonexudative age-related macular degeneration, left eye, advanced atrophic with subfoveal involvement: Secondary | ICD-10-CM | POA: Diagnosis not present

## 2022-06-10 DIAGNOSIS — H353211 Exudative age-related macular degeneration, right eye, with active choroidal neovascularization: Secondary | ICD-10-CM | POA: Diagnosis not present

## 2022-06-10 DIAGNOSIS — H353113 Nonexudative age-related macular degeneration, right eye, advanced atrophic without subfoveal involvement: Secondary | ICD-10-CM | POA: Diagnosis not present

## 2022-06-11 ENCOUNTER — Telehealth: Payer: Self-pay | Admitting: Internal Medicine

## 2022-06-11 NOTE — Telephone Encounter (Signed)
Ok this is noted 

## 2022-06-11 NOTE — Telephone Encounter (Signed)
FYI, please advise

## 2022-06-11 NOTE — Telephone Encounter (Signed)
Nurse from [facility] called to inform she went to try and do the OT evaluation and the patient declined. She stated that the patient is overwhelmed with the amount of therapist coming in and all the calls they are getting, she stated she tried to explain who she was and what she was there for and the patient stated they will contact her when they are ready. She stated patient would like to only keep the physical therapy and nurse for now.

## 2022-06-12 ENCOUNTER — Telehealth: Payer: Self-pay | Admitting: Internal Medicine

## 2022-06-12 NOTE — Telephone Encounter (Signed)
Shanon Brow from [place] call to get verbal orders for home health PT for: 2X for 6 weeks 1X for 1 week.  Shanon Brow states that the pt has had several falls with head injuries and is hoping to improve strength, balance and safety awareness.   Please call Shanon Brow to confirm:  [phone]

## 2022-06-12 NOTE — Telephone Encounter (Signed)
Ok for verbals 

## 2022-06-18 NOTE — Telephone Encounter (Signed)
Nurse from [place] called stating patient refused the nurse visit for this week and said he told them to call back next week. She stated he still has physical therapy coming out.

## 2022-06-18 NOTE — Telephone Encounter (Signed)
Shanon Brow, physical therapist with [facility], called to inform us that patient had a fall on either late Monday evening or early Tuesday morning. Patient had an abrasion on his forehead and right forearm with no other obvious injuries. If any questions he can be reached at [phone].

## 2022-06-19 NOTE — Telephone Encounter (Signed)
FYI, please advise if possible

## 2022-06-19 NOTE — Telephone Encounter (Signed)
Ok this is noted as well, no new orders

## 2022-06-22 NOTE — Telephone Encounter (Signed)
Shanon Brow, physical therapist with [facility], called with an FYI for the patient.States patient is continuing to have falls, as he was seen on Friday and patient stated he fell the previous day. He is also asking to request to try again for an OT evaluation, as one was tried before and patient and wife were a little apprehensive to the idea. Shanon Brow can be reached at [phone] if there are any questions.

## 2022-06-23 NOTE — Telephone Encounter (Signed)
[place] for verbal if that is ok

## 2022-06-23 NOTE — Telephone Encounter (Signed)
[place] visit this past Friday, pt had a fall and another one the day before. [facility] is requesting another OT eval as well

## 2022-06-24 ENCOUNTER — Telehealth: Payer: Self-pay | Admitting: Internal Medicine

## 2022-06-24 NOTE — Telephone Encounter (Signed)
Gave ok for verbal orders on a secure line

## 2022-06-24 NOTE — Telephone Encounter (Signed)
Ok this is noted 

## 2022-06-24 NOTE — Telephone Encounter (Signed)
David from [facility] just left the pt's house. States he was yelled at and cursed out by the pt.   Also states the pt seemed confused about HH schedule, but became very angry and told Shanon Brow he wasn't going to do anymore therapy and to stop coming for visits.   Shanon Brow is concerned because the pt has not been following therapy orders, and is not using his walker resulting in several falls.   [facility] will be discharging the pt as requested.  For any questions please call Shanon Brow: [phone]

## 2022-06-24 NOTE — Telephone Encounter (Signed)
Shanon Brow from [place] reports details from home visit

## 2022-07-08 DIAGNOSIS — K219 Gastro-esophageal reflux disease without esophagitis: Secondary | ICD-10-CM

## 2022-07-08 DIAGNOSIS — H409 Unspecified glaucoma: Secondary | ICD-10-CM

## 2022-07-08 DIAGNOSIS — I350 Nonrheumatic aortic (valve) stenosis: Secondary | ICD-10-CM

## 2022-07-08 DIAGNOSIS — Z8701 Personal history of pneumonia (recurrent): Secondary | ICD-10-CM

## 2022-07-08 DIAGNOSIS — E1165 Type 2 diabetes mellitus with hyperglycemia: Secondary | ICD-10-CM | POA: Diagnosis not present

## 2022-07-08 DIAGNOSIS — Z7984 Long term (current) use of oral hypoglycemic drugs: Secondary | ICD-10-CM

## 2022-07-08 DIAGNOSIS — H3530 Unspecified macular degeneration: Secondary | ICD-10-CM

## 2022-07-08 DIAGNOSIS — Z87440 Personal history of urinary (tract) infections: Secondary | ICD-10-CM

## 2022-07-08 DIAGNOSIS — E1142 Type 2 diabetes mellitus with diabetic polyneuropathy: Secondary | ICD-10-CM | POA: Diagnosis not present

## 2022-07-08 DIAGNOSIS — Z96651 Presence of right artificial knee joint: Secondary | ICD-10-CM

## 2022-07-08 DIAGNOSIS — F32A Depression, unspecified: Secondary | ICD-10-CM

## 2022-07-08 DIAGNOSIS — I493 Ventricular premature depolarization: Secondary | ICD-10-CM

## 2022-07-08 DIAGNOSIS — E538 Deficiency of other specified B group vitamins: Secondary | ICD-10-CM

## 2022-07-08 DIAGNOSIS — Z8673 Personal history of transient ischemic attack (TIA), and cerebral infarction without residual deficits: Secondary | ICD-10-CM

## 2022-07-08 DIAGNOSIS — Z85828 Personal history of other malignant neoplasm of skin: Secondary | ICD-10-CM

## 2022-07-08 DIAGNOSIS — I482 Chronic atrial fibrillation, unspecified: Secondary | ICD-10-CM | POA: Diagnosis not present

## 2022-07-08 DIAGNOSIS — G4700 Insomnia, unspecified: Secondary | ICD-10-CM

## 2022-07-08 DIAGNOSIS — Z9181 History of falling: Secondary | ICD-10-CM

## 2022-07-08 DIAGNOSIS — M1990 Unspecified osteoarthritis, unspecified site: Secondary | ICD-10-CM

## 2022-07-08 DIAGNOSIS — Z8719 Personal history of other diseases of the digestive system: Secondary | ICD-10-CM

## 2022-07-08 DIAGNOSIS — F419 Anxiety disorder, unspecified: Secondary | ICD-10-CM

## 2022-07-08 DIAGNOSIS — E785 Hyperlipidemia, unspecified: Secondary | ICD-10-CM

## 2022-07-08 DIAGNOSIS — E559 Vitamin D deficiency, unspecified: Secondary | ICD-10-CM

## 2022-07-08 DIAGNOSIS — I10 Essential (primary) hypertension: Secondary | ICD-10-CM | POA: Diagnosis not present

## 2022-07-08 DIAGNOSIS — N2889 Other specified disorders of kidney and ureter: Secondary | ICD-10-CM

## 2022-07-28 ENCOUNTER — Ambulatory Visit (INDEPENDENT_AMBULATORY_CARE_PROVIDER_SITE_OTHER): Payer: Medicare Other | Admitting: Podiatry

## 2022-07-28 DIAGNOSIS — B351 Tinea unguium: Secondary | ICD-10-CM

## 2022-07-28 DIAGNOSIS — M79676 Pain in unspecified toe(s): Secondary | ICD-10-CM | POA: Diagnosis not present

## 2022-07-28 DIAGNOSIS — Z7901 Long term (current) use of anticoagulants: Secondary | ICD-10-CM

## 2022-08-01 NOTE — Progress Notes (Signed)
Subjective: 89 y.o. returns the office today for painful, elongated, thickened toenails which he cannot trim himself. Denies any redness or drainage around the nails.  No other lower extremity complaints today.  He has been having issues with falls.  No lower extremity injury.  He is no longer on Eliquis   PCP: Biagio Borg, MD A1c: 7.4 on 05/27/2022  Objective: NAD DP/PT pulses palpable, CRT less than 3 seconds Nails hypertrophic, dystrophic, elongated, brittle, discolored 10. There is tenderness overlying the nails 1-5 bilaterally. There is no surrounding erythema or drainage along the nail sites. Dry skin present without any skin fissures or open lesions. No pain with calf compression, swelling, warmth, erythema.  Assessment: Patient presents with symptomatic onychomycosis  Plan: -Treatment options including alternatives, risks, complications were discussed -Nails sharply debrided 10 without complication/bleeding. -Is follow-up with primary care doctor in regards to their gait instability. -Recommend moisturizer daily but do not apply interdigitally. -Discussed daily foot inspection. If there are any changes, to call the office immediately.  -Follow-up in 3 months or sooner if any problems are to arise. In the meantime, encouraged to call the office with any questions, concerns, changes symptoms.  Celesta Gentile, DPM

## 2022-09-11 ENCOUNTER — Other Ambulatory Visit: Payer: Self-pay | Admitting: Internal Medicine

## 2022-09-11 ENCOUNTER — Other Ambulatory Visit: Payer: Self-pay | Admitting: Cardiology

## 2022-09-11 DIAGNOSIS — I4819 Other persistent atrial fibrillation: Secondary | ICD-10-CM

## 2022-09-11 DIAGNOSIS — I119 Hypertensive heart disease without heart failure: Secondary | ICD-10-CM

## 2022-10-02 ENCOUNTER — Other Ambulatory Visit: Payer: Self-pay | Admitting: Internal Medicine

## 2022-10-02 DIAGNOSIS — R269 Unspecified abnormalities of gait and mobility: Secondary | ICD-10-CM

## 2022-10-02 DIAGNOSIS — I482 Chronic atrial fibrillation, unspecified: Secondary | ICD-10-CM

## 2022-10-02 DIAGNOSIS — R296 Repeated falls: Secondary | ICD-10-CM

## 2022-10-05 ENCOUNTER — Telehealth: Payer: Self-pay | Admitting: Internal Medicine

## 2022-10-05 NOTE — Telephone Encounter (Signed)
Aggie Cosier with [employer] called and said there was an order put in for the patient to receive a roller walker with a seat. She said they need the appointment notes stating the need for the walker, as well as the patient's height and weight. Best callback for Aggie Cosier is [phone]).

## 2022-10-06 NOTE — Telephone Encounter (Signed)
Faxed over last OV to [employer] [facility]../l;mb

## 2022-10-09 NOTE — Telephone Encounter (Signed)
Patient's wife called and said the order for the roller walker was sent to the wrong location. She said it needs to be sent to [facility]. She is very upset and would like it fixed ASAP.  Best callback for Stephen Wilkinson is [phone].

## 2022-10-09 NOTE — Telephone Encounter (Signed)
Fax has been sent

## 2022-10-26 ENCOUNTER — Encounter: Payer: Self-pay | Admitting: Podiatry

## 2022-10-26 ENCOUNTER — Ambulatory Visit (INDEPENDENT_AMBULATORY_CARE_PROVIDER_SITE_OTHER): Payer: Medicare Other | Admitting: Podiatry

## 2022-10-26 DIAGNOSIS — B351 Tinea unguium: Secondary | ICD-10-CM | POA: Diagnosis not present

## 2022-10-26 DIAGNOSIS — M79676 Pain in unspecified toe(s): Secondary | ICD-10-CM

## 2022-10-26 DIAGNOSIS — E1140 Type 2 diabetes mellitus with diabetic neuropathy, unspecified: Secondary | ICD-10-CM

## 2022-10-26 NOTE — Progress Notes (Signed)
Subjective: Chief Complaint  Patient presents with   Debridement    Trim toenails    89 y.o. returns the office today for painful, elongated, thickened toenails which he cannot trim himself. Denies any redness or drainage around the nails.  No other lower extremity complaints today.  He has been having issues with falls.  No lower extremity injury.  He is no longer on Eliquis   PCP: Corwin Levins, MD A1c: 7.4 on 05/27/2022  Objective: NAD DP/PT pulses palpable, CRT less than 3 seconds Nails hypertrophic, dystrophic, elongated, brittle, discolored 10. There is tenderness overlying the nails 1-5 bilaterally. There is no surrounding erythema or drainage along the nail sites. Dry skin present without any skin fissures or open lesions. No pain with calf compression, swelling, warmth, erythema.  Assessment: Patient presents with symptomatic onychomycosis  Plan: -Treatment options including alternatives, risks, complications were discussed -Nails sharply debrided 10 without complication/bleeding. -Recommend moisturizer daily but do not apply interdigitally. -Discussed daily foot inspection. If there are any changes, to call the office immediately.  -Follow-up in 3 months or sooner if any problems are to arise. In the meantime, encouraged to call the office with any questions, concerns, changes symptoms.  Ovid Curd, DPM

## 2022-12-06 ENCOUNTER — Encounter (HOSPITAL_COMMUNITY): Payer: Self-pay | Admitting: Emergency Medicine

## 2022-12-06 ENCOUNTER — Emergency Department (HOSPITAL_COMMUNITY): Payer: Medicare Other

## 2022-12-06 ENCOUNTER — Other Ambulatory Visit: Payer: Self-pay

## 2022-12-06 ENCOUNTER — Emergency Department (HOSPITAL_COMMUNITY)
Admission: EM | Admit: 2022-12-06 | Discharge: 2022-12-06 | Disposition: A | Discharge status: Home / Self Care | Payer: Medicare Other | Attending: Emergency Medicine | Admitting: Emergency Medicine

## 2022-12-06 DIAGNOSIS — F419 Anxiety disorder, unspecified: Secondary | ICD-10-CM | POA: Diagnosis not present

## 2022-12-06 DIAGNOSIS — I10 Essential (primary) hypertension: Secondary | ICD-10-CM | POA: Diagnosis not present

## 2022-12-06 DIAGNOSIS — Z7984 Long term (current) use of oral hypoglycemic drugs: Secondary | ICD-10-CM | POA: Insufficient documentation

## 2022-12-06 DIAGNOSIS — I672 Cerebral atherosclerosis: Secondary | ICD-10-CM | POA: Diagnosis not present

## 2022-12-06 DIAGNOSIS — R4182 Altered mental status, unspecified: Secondary | ICD-10-CM | POA: Insufficient documentation

## 2022-12-06 DIAGNOSIS — E039 Hypothyroidism, unspecified: Secondary | ICD-10-CM | POA: Insufficient documentation

## 2022-12-06 DIAGNOSIS — R454 Irritability and anger: Secondary | ICD-10-CM | POA: Insufficient documentation

## 2022-12-06 DIAGNOSIS — G319 Degenerative disease of nervous system, unspecified: Secondary | ICD-10-CM | POA: Insufficient documentation

## 2022-12-06 DIAGNOSIS — I4510 Unspecified right bundle-branch block: Secondary | ICD-10-CM | POA: Insufficient documentation

## 2022-12-06 DIAGNOSIS — R451 Restlessness and agitation: Secondary | ICD-10-CM | POA: Insufficient documentation

## 2022-12-06 DIAGNOSIS — Z7689 Persons encountering health services in other specified circumstances: Secondary | ICD-10-CM

## 2022-12-06 DIAGNOSIS — E119 Type 2 diabetes mellitus without complications: Secondary | ICD-10-CM | POA: Insufficient documentation

## 2022-12-06 LAB — CBC
HCT: 42.8 % (ref 39.0–52.0)
Hemoglobin: 14.4 g/dL (ref 13.0–17.0)
MCH: 32.7 pg (ref 26.0–34.0)
MCHC: 33.6 g/dL (ref 30.0–36.0)
MCV: 97.3 fL (ref 80.0–100.0)
Platelets: 244 10*3/uL (ref 150–400)
RBC: 4.4 MIL/uL (ref 4.22–5.81)
RDW: 13.1 % (ref 11.5–15.5)
WBC: 6.7 10*3/uL (ref 4.0–10.5)
nRBC: 0 % (ref 0.0–0.2)

## 2022-12-06 LAB — URINALYSIS, ROUTINE W REFLEX MICROSCOPIC
Bilirubin Urine: NEGATIVE
Glucose, UA: NEGATIVE mg/dL
Hgb urine dipstick: NEGATIVE
Ketones, ur: NEGATIVE mg/dL
Leukocytes,Ua: NEGATIVE
Nitrite: NEGATIVE
Protein, ur: NEGATIVE mg/dL
Specific Gravity, Urine: 1.009 (ref 1.005–1.030)
pH: 6 (ref 5.0–8.0)

## 2022-12-06 LAB — ETHANOL: Alcohol, Ethyl (B): <10 mg/dL (ref <10)

## 2022-12-06 LAB — RAPID URINE DRUG SCREEN, HOSP PERFORMED
Amphetamines: NOT DETECTED
Barbiturates: NOT DETECTED
Benzodiazepines: NOT DETECTED
Cocaine: NOT DETECTED
Opiates: NOT DETECTED
Tetrahydrocannabinol: NOT DETECTED

## 2022-12-06 LAB — BASIC METABOLIC PANEL
Anion gap: 12 (ref 5–15)
BUN: 15 mg/dL (ref 8–23)
CO2: 25 mmol/L (ref 22–32)
Calcium: 9.2 mg/dL (ref 8.9–10.3)
Chloride: 103 mmol/L (ref 98–111)
Creatinine, Ser: 1.5 mg/dL — ABNORMAL HIGH (ref 0.61–1.24)
GFR, Estimated: 43 mL/min — ABNORMAL LOW (ref >60)
Glucose, Bld: 142 mg/dL — ABNORMAL HIGH (ref 70–99)
Potassium: 4.4 mmol/L (ref 3.5–5.1)
Sodium: 140 mmol/L (ref 135–145)

## 2022-12-06 NOTE — ED Notes (Signed)
Drema Dallas (Spouse) called asking for a update. Her number is [phone]. [facility] called her but will call her back.

## 2022-12-06 NOTE — ED Triage Notes (Signed)
Patient BIB ems from home for Behavioral episode. Patient felt he was sleeping too long 7hr nap, upset with wife for not calling EMS. Became agitated with wife and combative for not calling EMS. 186/96, CBG 134.  Patient Dx. Dementia.

## 2022-12-06 NOTE — Consult Note (Signed)
BH ED ASSESSMENT   Reason for Consult:  aggression, wife concerned Referring Physician:  Lynelle Doctor Patient Identification: Stephen Wilkinson MRN:  [id number] ED Chief Complaint: Encounter for psychiatric assessment  Diagnosis:  Principal Problem:   Encounter for psychiatric assessment   ED Assessment Time Calculation: Start Time: 1700 Stop Time: 1745 Total Time in Minutes (Assessment Completion): 45   HPI:   Stephen Wilkinson is a 89 y.o. male patient admitted with history of dementia hypertension hyperlipidemia, Beatties, hypothyroidism, anxiety, depression, TIA, atrial fibrillation.  Patient lives at home with his wife.  She was working today.  He states that he was sleeping this morning when she left.  He woke up again later and another 7 hours have passed.  Patient states he became very anxious about sleeping so long.  His wife was home when he asked her to take him to the hospital.  She wanted him to wait for little bit and see how he felt.  This upset the patient close and he became very agitated.   Reviewed situation with wife.  Wife tells me he has multiple medical problems.  She believes her husband has dementia.  SHe believes there is a law that he needs to be admitted to the hospital for 3 days to be diagnosed with dementia.  Husband is often yelling at her.  He has been Arboriculturist knifes under his pillow and the bedside table.  Wife is worried about his health.  Subjective:   Patient seen at [facility] for face to face psychiatric evaluation. Per chart review, patient has no psychiatric history. Patient also denies any previous psychiatric history or hospitalizations. Pt is mostly oriented. He is able to tell me his name, birthday, current location, city. However he is unsure of the year and guessed it was 2002 and that he was 89 years old. Pt reitorates story above, states he was concerned for his health after sleeping for so long, and it did make him upset that his wife did not want to take him to the  hospital. Pt stated "I would always take her so it upset me she didn't want to take me. We have been having some marital problems and we are have been arguing." Pt stated "I guess we need to figure it out because I am obviously getting really old and leaving isn't an option." Pt denies any accusations of hiding knives in the bedroom. Pt states "I promise to God that I never did that. I have never hurt my wife and I never will. I am not a violent person. I can't see well so maybe I accidentally grabbed one but I am not doing that." Pt denies any homicidal ideations. Denies any intentions of hiding weapons or trying to hurt his wife. Denies any secret intentions or will to harm his wife. He denies any suicidal ideations or previous suicide attempts. He denies auditory or visual hallucinations. Denies problems with sleep, reports "okay appetite." Denies any hx or current psychotropic medications. Pt is requesting discharge and to go home tonight.   I attempted to call his wife, Stephen Wilkinson at [phone] and left VM however have not heard back from her. Ultimately would like to explain behaviors associated with progressing dementia such as agitation or paranoia. If he does not have formal dementia diagnosis would also be critical to obtain neurology appointment for work up. Would also recommend for her to look into memory care unit. Would recommend attempting to make environment safe by removing these "knives" he  is hiding under bed. Will request EDP place TOC order for resources and dispo planning.   Ultimately, patient does not meet criteria for [place] IVC or inpatient psychiatric treatment. Unsure if patient has confirmed diagnosis of dementia. Regardless, patient needs to follow up with neurology for further testing. Will provide resources in AVS. Pt is able to contract for safety, denies SI/HI/AVH. Pt was able to engage in coherent and logical conversation. Thought content mostly intact, circumstantial at  times. Pt participated in assessment well for age and possible dementia. Pt is hoping to discharge home tonight. Will update ED staff with TTS disposition. Will psychiatrically clear patient.  Past Psychiatric History: none  Risk to Self or Others: Is the patient at risk to self? No Has the patient been a risk to self in the past 6 months? No Has the patient been a risk to self within the distant past? No Is the patient a risk to others? No Has the patient been a risk to others in the past 6 months? No Has the patient been a risk to others within the distant past? No  [place] Scale:  Flowsheet Row ED from 12/06/2022 in [facility] Emergency Department at [facility] Admission (Discharged) from 03/10/2022 in [facility] ENDOSCOPY Admission (Discharged) from 12/15/2021 in [facility] ENDOSCOPY  C-SSRS RISK CATEGORY No Risk No Risk No Risk       Past Medical History:  Past Medical History:  Diagnosis Date   Anxiety    B12 deficiency 09/23/2016   Blood transfusion without reported diagnosis    Cancer (HCC)    basal and squamous skin carcinoma    Cataract    Depression    Diabetes mellitus    GERD (gastroesophageal reflux disease)    Glaucoma 01/04/2013   Hx of transient ischemic attack (TIA) 01/04/2013   Hx: UTI (urinary tract infection)    Hyperlipidemia    Hypertension    Hypothyroidism    Insomnia    Macular degeneration    Neuropathy    OA (osteoarthritis)    Pneumonia    PVC's (premature ventricular contractions)    Renal mass    Stroke (HCC)    mid 1990s     Past Surgical History:  Procedure Laterality Date   APPENDECTOMY     CARDIOVERSION N/A 07/30/2021   Procedure: CARDIOVERSION;  Surgeon: Wendall Stade, MD;  Location: [facility] ENDOSCOPY;  Service: Cardiovascular;  Laterality: N/A;   CARDIOVERSION N/A 12/15/2021   Procedure: CARDIOVERSION;  Surgeon: Jake Bathe, MD;  Location: [facility] ENDOSCOPY;  Service: Cardiovascular;   Laterality: N/A;   CARDIOVERSION N/A 03/10/2022   Procedure: CARDIOVERSION;  Surgeon: Lewayne Bunting, MD;  Location: [facility] ENDOSCOPY;  Service: Cardiovascular;  Laterality: N/A;   CATARACT EXTRACTION W/ INTRAOCULAR LENS  IMPLANT, BILATERAL     INGUINAL HERNIA REPAIR     X3   IR KYPHO LUMBAR INC FX REDUCE BONE BX UNI/BIL CANNULATION INC/IMAGING  11/04/2021   LUMBAR LAMINECTOMY/DECOMPRESSION MICRODISCECTOMY N/A 02/12/2017   Procedure: Lumbar Three-Four, Lumbar Four-Five, Lumbar Five-Sacral One Laminectomy and Foraminotomy;  Surgeon: Julio Sicks, MD;  Location: [facility] OR;  Service: Neurosurgery;  Laterality: N/A;   OTHER SURGICAL HISTORY     microwave procedure for prostate - 09/12    PROSTATE ABLATION     microwave   REPLACEMENT TOTAL KNEE     TEE WITHOUT CARDIOVERSION N/A 03/10/2022   Procedure: TRANSESOPHAGEAL ECHOCARDIOGRAM (TEE);  Surgeon: Lewayne Bunting, MD;  Location: [facility] ENDOSCOPY;  Service: Cardiovascular;  Laterality: N/A;   THYROIDECTOMY     TONSILLECTOMY AND ADENOIDECTOMY     TOTAL KNEE ARTHROPLASTY  05/11/2011   Procedure: TOTAL KNEE ARTHROPLASTY;  Surgeon: Loanne Drilling;  Location: [facility] ORS;  Service: Orthopedics;  Laterality: Right;   US ECHOCARDIOGRAPHY  11/10/2006   EF 55-60%   US ECHOCARDIOGRAPHY  09/20/2002   EF 65-70%   Family History:  Family History  Problem Relation Age of Onset   Hypertension Mother    Arthritis Mother    Diabetes Mother    Hypertension Father    Stroke Father    Kidney disease Father    Hypertension Brother    Diabetes Brother    Social History:  Social History   Substance and Sexual Activity  Alcohol Use Yes   Alcohol/week: 7.0 standard drinks of alcohol   Types: 7 Shots of liquor per week   Comment: 2 drinks most days     Social History   Substance and Sexual Activity  Drug Use No    Social History   Socioeconomic History   Marital status: Married    Spouse name: Not on file   Number of children: 3   Years of education: Not on  file   Highest education level: Not on file  Occupational History   Occupation: retired    Associate Professor: RETIRED    Comment: [employer]  Tobacco Use   Smoking status: Never   Smokeless tobacco: Never  Vaping Use   Vaping Use: Never used  Substance and Sexual Activity   Alcohol use: Yes    Alcohol/week: 7.0 standard drinks of alcohol    Types: 7 Shots of liquor per week    Comment: 2 drinks most days   Drug use: No   Sexual activity: Never    Birth control/protection: None  Other Topics Concern   Not on file  Social History Narrative   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 [employer]      Home Situation: living with wife      Spiritual Beliefs: episcopalian      Lifestyle: walks daily, working on diet            Social Determinants of Health   Financial Resource Strain: Low Risk  (09/10/2020)   Overall Financial Resource Strain (CARDIA)    Difficulty of Paying Living Expenses: Not hard at all  Food Insecurity: No Food Insecurity (09/10/2020)   Hunger Vital Sign    Worried About Running Out of Food in the Last Year: Never true    Ran Out of Food in the Last Year: Never true  Transportation Needs: No Transportation Needs (09/10/2020)   PRAPARE - Administrator, Civil Service (Medical): No    Lack of Transportation (Non-Medical): No  Physical Activity: Inactive (09/10/2020)   Exercise Vital Sign    Days of Exercise per Week: 0 days    Minutes of Exercise per Session: 0 min  Stress: No Stress Concern Present (09/10/2020)   [employer] of Occupational Health - Occupational Stress Questionnaire    Feeling of Stress : Not at all  Social Connections: Not on file    Allergies:  No Known Allergies  Labs:  Results for orders placed or performed during the hospital encounter of 12/06/22 (from the past 48 hour(s))  CBC     Status: None   Collection Time: 12/06/22  3:17 PM  Result Value Ref Range   WBC 6.7  4.0 - 10.5 K/uL   RBC 4.40  4.22 - 5.81 MIL/uL   Hemoglobin 14.4 13.0 - 17.0 g/dL   HCT 78.2 95.6 - 21.3 %   MCV 97.3 80.0 - 100.0 fL   MCH 32.7 26.0 - 34.0 pg   MCHC 33.6 30.0 - 36.0 g/dL   RDW 08.6 57.8 - 46.9 %   Platelets 244 150 - 400 K/uL   nRBC 0.0 0.0 - 0.2 %    Comment: Performed at [facility], [address], [place], [place] [address]  Basic metabolic panel     Status: Abnormal   Collection Time: 12/06/22  3:17 PM  Result Value Ref Range   Sodium 140 135 - 145 mmol/L   Potassium 4.4 3.5 - 5.1 mmol/L   Chloride 103 98 - 111 mmol/L   CO2 25 22 - 32 mmol/L   Glucose, Bld 142 (H) 70 - 99 mg/dL    Comment: Glucose reference range applies only to samples taken after fasting for at least 8 hours.   BUN 15 8 - 23 mg/dL   Creatinine, Ser 8.41 (H) 0.61 - 1.24 mg/dL   Calcium 9.2 8.9 - 32.4 mg/dL   GFR, Estimated 43 (L) >60 mL/min    Comment: (NOTE) Calculated using the CKD-EPI Creatinine Equation (2021)    Anion gap 12 5 - 15    Comment: Performed at [facility], [address], [place], [place] [address]  Ethanol     Status: None   Collection Time: 12/06/22  4:34 PM  Result Value Ref Range   Alcohol, Ethyl (B) <10 <10 mg/dL    Comment: (NOTE) Lowest detectable limit for serum alcohol is 10 mg/dL.  For medical purposes only. Performed at [facility], [address], [place], [place] [address]   Rapid urine drug screen (hospital performed)     Status: None   Collection Time: 12/06/22  4:34 PM  Result Value Ref Range   Opiates NONE DETECTED NONE DETECTED   Cocaine NONE DETECTED NONE DETECTED   Benzodiazepines NONE DETECTED NONE DETECTED   Amphetamines NONE DETECTED NONE DETECTED   Tetrahydrocannabinol NONE DETECTED NONE DETECTED   Barbiturates NONE DETECTED NONE DETECTED    Comment: (NOTE) DRUG SCREEN FOR MEDICAL PURPOSES ONLY.  IF CONFIRMATION IS NEEDED FOR ANY PURPOSE, NOTIFY LAB WITHIN 5 DAYS.  LOWEST DETECTABLE LIMITS FOR URINE DRUG SCREEN Drug Class                      Cutoff (ng/mL) Amphetamine and metabolites    1000 Barbiturate and metabolites    200 Benzodiazepine                 200 Opiates and metabolites        300 Cocaine and metabolites        300 THC                            50 Performed at [facility], [address], [place], [place] [address]   Urinalysis, Routine w reflex microscopic -Urine, Clean Catch     Status: None   Collection Time: 12/06/22  4:35 PM  Result Value Ref Range   Color, Urine YELLOW YELLOW   APPearance CLEAR CLEAR   Specific Gravity, Urine 1.009 1.005 - 1.030   pH 6.0 5.0 - 8.0   Glucose, UA NEGATIVE NEGATIVE mg/dL   Hgb urine  dipstick NEGATIVE NEGATIVE   Bilirubin Urine NEGATIVE NEGATIVE   Ketones, ur NEGATIVE NEGATIVE mg/dL   Protein, ur NEGATIVE NEGATIVE mg/dL   Nitrite NEGATIVE NEGATIVE   Leukocytes,Ua NEGATIVE NEGATIVE    Comment: Performed at [facility], [address], [place], [place] [address]    No current facility-administered medications for this encounter.   Current Outpatient Medications  Medication Sig Dispense Refill   amiodarone (PACERONE) 200 MG tablet Take 1 tablet (200 mg total) by mouth daily. 90 tablet 3   Bevacizumab (AVASTIN IV) Place 1 Syringe into the right eye See admin instructions. 1 dose injected in the right eye every 7 weeks     famotidine (PEPCID) 20 MG tablet Take 1 tablet (20 mg total) by mouth 2 (two) times daily. (Patient taking differently: Take 20 mg by mouth in the morning.) 180 tablet 3   glucose blood (ONE TOUCH ULTRA TEST) test strip Use as directed once daily to check blood sugar.  Diagnosis code E11.42 100 each 11   latanoprost (XALATAN) 0.005 % ophthalmic solution SMARTSIG:In Eye(s)     levothyroxine (SYNTHROID) 125 MCG tablet TAKE 1 TABLET EVERY DAY BEFORE BREAKFAST 90 tablet 3   metFORMIN (GLUCOPHAGE) 1000 MG tablet Take 1 tablet (1,000 mg total) by mouth daily. 90 tablet 3   Multiple Vitamins-Minerals (ICAPS AREDS 2 PO) Take 2 capsules by  mouth daily.     Psychiatric Specialty Exam: Presentation  General Appearance:  Appropriate for Environment  Eye Contact: Good  Speech: Clear and Coherent  Speech Volume: Normal  Handedness:No data recorded  Mood and Affect  Mood: Euthymic  Affect: Congruent   Thought Process  Thought Processes: Goal Directed  Descriptions of Associations:Circumstantial  Orientation:Partial  Thought Content:WDL  History of Schizophrenia/Schizoaffective disorder:No data recorded Duration of Psychotic Symptoms:No data recorded Hallucinations:Hallucinations: None  Ideas of Reference:None  Suicidal Thoughts:Suicidal Thoughts: No  Homicidal Thoughts:Homicidal Thoughts: No   Sensorium  Memory: Immediate Fair; Recent Fair  Judgment: Fair  Insight: Fair   Art therapist  Concentration: Fair  Attention Span: Fair  Recall: [employer] of Knowledge: Fair  Language: Fair   Psychomotor Activity  Psychomotor Activity: Psychomotor Activity: Other (comment) (WDL for age)   Assets  Assets: Social Support; Housing; Intimacy; Leisure Time    Sleep  Sleep: Sleep: Good   Physical Exam: Physical Exam Neurological:     Mental Status: He is alert and oriented to person, place, and time.  Psychiatric:        Attention and Perception: Attention normal.        Mood and Affect: Mood is anxious.        Speech: Speech normal.        Behavior: Behavior is cooperative.        Thought Content: Thought content normal.    Review of Systems  Psychiatric/Behavioral:  The patient is nervous/anxious.    Blood pressure (!) 179/78, pulse 60, temperature 98 F (36.7 C), temperature source Oral, resp. rate (!) 24, height 6\' 1"  (1.854 m), weight 75.8 kg, SpO2 100 %. Body mass index is 22.05 kg/m.  Medical Decision Making: Pt case reviewed and discussed with Dr. Lucianne Muss. Pt does not meet criteria for [place] IVC or inpatient psychiatric treatment. Pt is able to contract  for safety, denies SI/HI/AVH. Will psychiatrically clear patient.   - will order TOC to assist with dispo and getting in contact with patient's wife. Will ask they safety plan with her about making environment safe, return precautions, and  memory care options.   Disposition:  psychiatrically cleared  Eligha Bridegroom, NP 12/06/2022 5:29 PM

## 2022-12-06 NOTE — Discharge Instructions (Signed)
Dementia placement resources  Adult Placement Services What is Adult Placement Services?  Adult Placement Services help aging or disabled adults find appropriate living and health care arrangements. These services are available to individuals whose health, safety and well being can no longer be maintained at home. Placement arrangements are made in adult care homes, nursing homes, other substitute homes, residential health care settings or institutions. Aging and disabled adults receive help to complete medical evaluations and financial applications and to locate and move to new settings. They also may receive counseling to help them adjust to change.  Adult placement services also help aging and disabled adults in the following situations:  Individuals unable to maintain themselves in their own homes independently or with available community or family supports. Individuals living in substitute homes, residential health care facilities or institutions and needing assistance in relocating due to changes in level of care needed. Those who need assistance in returning to more independent living arrangements. Those who need assistance in adjusting to or maintaining their placements due to individual or family problems or a lack of resources. Who can I Contact for Adult Placement Services?  If you or someone you know requires these services, contact your local Department of Social Services.  Resources for Adult Production designer, theatre/television/film Assistance In-Home Brochure: The Special Assistance In-Home program provides financial assistance and case management support to help disabled adults age in place in residential settings.  Transitions to Community Living: The Transitions to [employer]) provides eligible adults living with serious mental illnesses the opportunity to choose where they live, work and play in [place]. This initiative promotes recovery through providing long-term housing,  community-based services, supported employment and community integration.  [place] [facility]SR Facility Listing: [place] [facility] Facility Listings: These are listings of facilities licensed by the Division of Health Service Regulation [facility]) by the type of service they provide their clients. Outpatient psychiatric Services  Walk in hours for medication management Monday, Wednesday, Thursday, and Friday from 8:00 AM to 11:00 AM Recommend arriving by by 7:30 AM.  It is first come first serve.    Walk in hours for therapy intake Monday and Wednesday only 8:00 AM to 11:00 AM Encouraged to arrive by 7:30 AM.  It is first come first serve   Inpatient patient psychiatric services The Facility Based Crisis Unit offers comprehensive behavioral heath care services for mental health and substance abuse treatment.  Social work can also assist with referral to or getting you into a rehabilitation program short or long term

## 2022-12-06 NOTE — ED Provider Notes (Signed)
[place] EMERGENCY DEPARTMENT AT [facility] Provider Note   CSN: [id number] Arrival date & time: 12/06/22  1443     History  Chief Complaint  Patient presents with   Behavioral     Stephen Wilkinson is a 89 y.o. male.  HPI   Patient has a history of dementia hypertension hyperlipidemia, Beatties, hypothyroidism, anxiety, depression, TIA, atrial fibrillation.  Patient lives at home with his wife.  She was working today.  He states that he was sleeping this morning when she left.  He woke up again later and another 7 hours have passed.  Patient states she became very anxious about sleeping so long.  His wife was home when he after to take him to the hospital.  She wanted him to wait for little bit and see how he felt.  This upset the patient close and he became very agitated.  Reviewed situation with wife.  Wife tells me she has multiple medical problems.  She believes her husband has dementia.  SHe believes there is a law that he needs to be admitted to the hospital for 3 days to be diagnosed with dementia.  Husband is often yelling at her.  He has been Arboriculturist knifes under his pillow and the bedside table.  Wife is worried about his health.  PCP referred to  Home Medications Prior to Admission medications   Medication Sig Start Date End Date Taking? Authorizing Provider  amiodarone (PACERONE) 200 MG tablet Take 1 tablet (200 mg total) by mouth daily. 03/11/22   Lewayne Bunting, MD  Bevacizumab (AVASTIN IV) Place 1 Syringe into the right eye See admin instructions. 1 dose injected in the right eye every 7 weeks    [provider]  famotidine (PEPCID) 20 MG tablet Take 1 tablet (20 mg total) by mouth 2 (two) times daily. Patient taking differently: Take 20 mg by mouth in the morning. 04/10/20   Corwin Levins, MD  glucose blood (ONE TOUCH ULTRA TEST) test strip Use as directed once daily to check blood sugar.  Diagnosis code E11.42 10/09/20   Corwin Levins, MD   latanoprost (XALATAN) 0.005 % ophthalmic solution SMARTSIG:In Eye(s) 04/07/22   [provider]  levothyroxine (SYNTHROID) 125 MCG tablet TAKE 1 TABLET EVERY DAY BEFORE BREAKFAST 09/14/22   Corwin Levins, MD  metFORMIN (GLUCOPHAGE) 1000 MG tablet Take 1 tablet (1,000 mg total) by mouth daily. 05/27/22   Corwin Levins, MD  Multiple Vitamins-Minerals (ICAPS AREDS 2 PO) Take 2 capsules by mouth daily.    [provider]      Allergies    Patient has no known allergies.    Review of Systems   Review of Systems  Physical Exam Updated Vital Signs BP (!) 179/78   Pulse 60   Temp 98 F (36.7 C) (Oral)   Resp (!) 24   Ht 1.854 m (6\' 1" )   Wt 75.8 kg   SpO2 100%   BMI 22.05 kg/m  Physical Exam Vitals and nursing note reviewed.  Constitutional:      Appearance: He is well-developed. He is not diaphoretic.  HENT:     Head: Normocephalic and atraumatic.     Right Ear: External ear normal.     Left Ear: External ear normal.  Eyes:     General: No scleral icterus.       Right eye: No discharge.        Left eye: No discharge.  Conjunctiva/sclera: Conjunctivae normal.  Neck:     Trachea: No tracheal deviation.  Cardiovascular:     Rate and Rhythm: Normal rate and regular rhythm.  Pulmonary:     Effort: Pulmonary effort is normal. No respiratory distress.     Breath sounds: Normal breath sounds. No stridor. No wheezing or rales.  Abdominal:     General: Bowel sounds are normal. There is no distension.     Palpations: Abdomen is soft.     Tenderness: There is no abdominal tenderness. There is no guarding or rebound.  Musculoskeletal:        General: No tenderness or deformity.     Cervical back: Neck supple.  Skin:    General: Skin is warm and dry.     Findings: No rash.  Neurological:     General: No focal deficit present.     Mental Status: He is alert.     Cranial Nerves: No cranial nerve deficit, dysarthria or facial asymmetry.     Sensory: No sensory  deficit.     Motor: No abnormal muscle tone or seizure activity.     Coordination: Coordination normal.  Psychiatric:        Mood and Affect: Mood is anxious.     ED Results / Procedures / Treatments   Labs (all labs ordered are listed, but only abnormal results are displayed) Labs Reviewed  BASIC METABOLIC PANEL - Abnormal; Notable for the following components:      Result Value   Glucose, Bld 142 (*)    Creatinine, Ser 1.50 (*)    GFR, Estimated 43 (*)    All other components within normal limits  CBC  URINALYSIS, ROUTINE W REFLEX MICROSCOPIC  ETHANOL  RAPID URINE DRUG SCREEN, HOSP PERFORMED    EKG EKG Interpretation  Date/Time:  Sunday December 06 2022 15:28:59 EDT Ventricular Rate:  62 PR Interval:  220 QRS Duration: 112 QT Interval:  455 QTC Calculation: 463 R Axis:   -48 Text Interpretation: Sinus or ectopic atrial rhythm Prolonged PR interval Incomplete RBBB and LAFB Anteroseptal infarct, old No significant change since last tracing Confirmed by Linwood Dibbles [id number]) on 12/06/2022 3:32:06 PM  Radiology CT Head Wo Contrast  Result Date: 12/06/2022 CLINICAL DATA:  Altered level of consciousness EXAM: CT HEAD WITHOUT CONTRAST TECHNIQUE: Contiguous axial images were obtained from the base of the skull through the vertex without intravenous contrast. RADIATION DOSE REDUCTION: This exam was performed according to the departmental dose-optimization program which includes automated exposure control, adjustment of the mA and/or kV according to patient size and/or use of iterative reconstruction technique. COMPARISON:  10/28/2021 FINDINGS: Brain: Chronic small vessel ischemic changes are seen throughout the periventricular white matter. There is diffuse cerebral atrophy unchanged. No acute infarct or hemorrhage. Stable ex vacuo dilatation of the lateral ventricles. Midline structures are unremarkable. No acute extra-axial fluid collections. No mass effect. Vascular: Stable  atherosclerosis.  No hyperdense vessel. Skull: Normal. Negative for fracture or focal lesion. Sinuses/Orbits: No acute finding. Other: None. IMPRESSION: 1. Stable head CT, no acute intracranial process. Electronically Signed   By: Sharlet Salina M.D.   On: 12/06/2022 16:24    Procedures Procedures    Medications Ordered in ED Medications - No data to display  ED Course/ Medical Decision Making/ A&P Clinical Course as of 12/06/22 1818  Sun Dec 06, 2022  1653 Head CT without acute abnormalities.  CBC normal.  Metabolic panel with slightly elevated creatinine doubt clinically significant [JK]  1816 Head CT  without acute findings [JK]    Clinical Course User Index [JK] Linwood Dibbles, MD                             Medical Decision Making Amount and/or Complexity of Data Reviewed Labs: ordered. Decision-making details documented in ED Course. Radiology: ordered and independent interpretation performed.   Patient was seen by the psychiatry team.  They do not feel the patient rates inpatient criteria.  Patient denies any SI HI.  They have asked TOC to contact the wife as well  Patient does not appear to have any signs of an acute infection.  He is not under the influence of drugs.  He is not dehydrated.  No signs of stroke or delirium.  Possible the patient may be having some developing dementia at his age.  He also has anxiety and that contributed to this episode today.   Patient may ultimately benefit from some type of memory care unit or assisted living facility.  Evaluation and diagnostic testing in the emergency department does not suggest an emergent condition requiring admission or immediate intervention beyond what has been performed at this time.  The patient is safe for discharge and has been instructed to return immediately for worsening symptoms, change in symptoms or any other concerns.       Final Clinical Impression(s) / ED Diagnoses Final diagnoses:  Anger reaction   Anxiety    Rx / DC Orders ED Discharge Orders     None         Linwood Dibbles, MD 12/06/22 1818

## 2022-12-06 NOTE — ED Notes (Signed)
Patient's spouse updated on his discharge plan .

## 2022-12-07 ENCOUNTER — Telehealth: Payer: Self-pay

## 2022-12-07 NOTE — Transitions of Care (Post Inpatient/ED Visit) (Signed)
   12/07/2022  Name: Stephen Wilkinson MRN: [id number] DOB: Oct 13, 1928  Today's TOC FU Call Status: Today's TOC FU Call Status:: Successful TOC FU Call Competed TOC FU Call Complete Date: 12/07/22  Transition Care Management Follow-up Telephone Call Date of Discharge: 12/06/22 Discharge Facility: [facility]) Type of Discharge: Emergency Department Reason for ED Visit: Other: (irritability) How have you been since you were released from the hospital?: Same Any questions or concerns?: No  Items Reviewed: Did you receive and understand the discharge instructions provided?: Yes Medications obtained,verified, and reconciled?: Yes (Medications Reviewed) Any new allergies since your discharge?: No Dietary orders reviewed?: Yes Do you have support at home?: Yes People in Home: spouse  Medications Reviewed Today: Medications Reviewed Today     Reviewed by Karena Addison, LPN (Licensed Practical Nurse) on 12/07/22 at 1422  Med List Status: <None>   Medication Order Taking? Sig Documenting Provider Last Dose Status Informant  amiodarone (PACERONE) 200 MG tablet [id number] No Take 1 tablet (200 mg total) by mouth daily. Lewayne Bunting, MD Taking Active   Bevacizumab (AVASTIN IV) [id number] No Place 1 Syringe into the right eye See admin instructions. 1 dose injected in the right eye every 7 weeks [provider] Taking Active Self, Spouse/Significant Other, Pharmacy Records  famotidine (PEPCID) 20 MG tablet [id number] No Take 1 tablet (20 mg total) by mouth 2 (two) times daily.  Patient taking differently: Take 20 mg by mouth in the morning.   Corwin Levins, MD Taking Active Self, Spouse/Significant Other, Pharmacy Records  glucose blood (ONE TOUCH ULTRA TEST) test strip [id number] No Use as directed once daily to check blood sugar.  Diagnosis code E11.42 Corwin Levins, MD Taking Active Self, Spouse/Significant Other, Pharmacy Records  latanoprost (XALATAN) 0.005 % ophthalmic solution  [id number] No SMARTSIG:In Eye(s) [provider] Taking Active   levothyroxine (SYNTHROID) 125 MCG tablet [id number]  TAKE 1 TABLET EVERY DAY BEFORE BREAKFAST Corwin Levins, MD  Active   metFORMIN (GLUCOPHAGE) 1000 MG tablet [id number]  Take 1 tablet (1,000 mg total) by mouth daily. Corwin Levins, MD  Active   Multiple Vitamins-Minerals (ICAPS AREDS 2 PO) [id number] No Take 2 capsules by mouth daily. [provider] Taking Active Self, Spouse/Significant Other, Pharmacy Records            Home Care and Equipment/Supplies: Were Home Health Services Ordered?: NA Any new equipment or medical supplies ordered?: NA  Functional Questionnaire: Do you need assistance with bathing/showering or dressing?: Yes Do you need assistance with meal preparation?: Yes Do you need assistance with eating?: No Do you have difficulty maintaining continence: Yes Do you need assistance with getting out of bed/getting out of a chair/moving?: No Do you have difficulty managing or taking your medications?: Yes  Follow up appointments reviewed: PCP Follow-up appointment confirmed?: Yes Date of PCP follow-up appointment?: 12/10/22 Follow-up Provider: [facility] Follow-up appointment confirmed?: NA Do you need transportation to your follow-up appointment?: No Do you understand care options if your condition(s) worsen?: Yes-patient verbalized understanding    SIGNATURE Karena Addison, LPN [facility] Nurse Health Advisor Direct Dial [phone]

## 2022-12-10 ENCOUNTER — Telehealth: Payer: Self-pay | Admitting: Internal Medicine

## 2022-12-10 ENCOUNTER — Inpatient Hospital Stay: Payer: Medicare Other | Admitting: Internal Medicine
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
## 2022-12-10 NOTE — Telephone Encounter (Signed)
Pt should leave the home  But if unable, she should call police if in imminent danger  Otherwise she may need to call 911 for evaluation and transport of him to ED (hopeful he would agree to this) for aggressive behaviour and urgent psychiatric evaluation

## 2022-12-11 NOTE — Telephone Encounter (Signed)
Called and let Pt wife know.

## 2022-12-30 NOTE — Progress Notes (Signed)
HPI: Follow-up hypertension and atrial fibrillation. Also history of TIA. Nuclear study March 2017 showed ejection fraction 62% and no ischemia.  Carotid Dopplers May 2020 showed 1 to 39% bilateral stenosis. Question episodes of syncope at previous ov. Monitor 4/21 showed sinus with PACs, PVCs brief PAT but no pauses.  Found to be in atrial fibrillation at office visit May 30, 2021. Echocardiogram repeated January 2023 and showed normal LV function, mild left ventricular hypertrophy, mild right ventricular enlargement, mild left atrial enlargement, mild to moderate tricuspid regurgitation, mildly dilated ascending aorta, moderate aortic stenosis with mean gradient 12 mmHg, aortic valve area 1.5 cm and dimensionless index 0.3. Transesophageal echocardiogram March 10, 2022 showed normal LV function, moderate aortic stenosis with mean gradient 20 mmHg, moderate left atrial enlargement, mild to moderate mitral regurgitation.  Patient had successful cardioversion on amiodarone at that time.  Since last seen, he has some dyspnea on exertion unchanged.  No orthopnea, PND, pedal edema, chest pain or syncope.  He is having difficulties with his memory.  Current Outpatient Medications  Medication Sig Dispense Refill   amiodarone (PACERONE) 200 MG tablet Take 1 tablet (200 mg total) by mouth daily. 90 tablet 3   Bevacizumab (AVASTIN IV) Place 1 Syringe into the right eye See admin instructions. 1 dose injected in the right eye every 7 weeks     famotidine (PEPCID) 20 MG tablet Take 1 tablet (20 mg total) by mouth 2 (two) times daily. (Patient taking differently: Take 20 mg by mouth in the morning.) 180 tablet 3   glucose blood (ONE TOUCH ULTRA TEST) test strip Use as directed once daily to check blood sugar.  Diagnosis code E11.42 100 each 11   latanoprost (XALATAN) 0.005 % ophthalmic solution SMARTSIG:In Eye(s)     levothyroxine (SYNTHROID) 125 MCG tablet TAKE 1 TABLET EVERY DAY BEFORE BREAKFAST 90  tablet 3   metFORMIN (GLUCOPHAGE) 1000 MG tablet Take 1 tablet (1,000 mg total) by mouth daily. 90 tablet 3   Multiple Vitamins-Minerals (ICAPS AREDS 2 PO) Take 2 capsules by mouth daily.     No current facility-administered medications for this visit.     Past Medical History:  Diagnosis Date   Anxiety    B12 deficiency 09/23/2016   Blood transfusion without reported diagnosis    Cancer (HCC)    basal and squamous skin carcinoma    Cataract    Depression    Diabetes mellitus    GERD (gastroesophageal reflux disease)    Glaucoma 01/04/2013   Hx of transient ischemic attack (TIA) 01/04/2013   Hx: UTI (urinary tract infection)    Hyperlipidemia    Hypertension    Hypothyroidism    Insomnia    Macular degeneration    Neuropathy    OA (osteoarthritis)    Pneumonia    PVC's (premature ventricular contractions)    Renal mass    Stroke (HCC)    mid 1990s     Past Surgical History:  Procedure Laterality Date   APPENDECTOMY     CARDIOVERSION N/A 07/30/2021   Procedure: CARDIOVERSION;  Surgeon: Wendall Stade, MD;  Location: [facility] ENDOSCOPY;  Service: Cardiovascular;  Laterality: N/A;   CARDIOVERSION N/A 12/15/2021   Procedure: CARDIOVERSION;  Surgeon: Jake Bathe, MD;  Location: [facility] ENDOSCOPY;  Service: Cardiovascular;  Laterality: N/A;   CARDIOVERSION N/A 03/10/2022   Procedure: CARDIOVERSION;  Surgeon: Lewayne Bunting, MD;  Location[facility] ENDOSCOPY;  Service: Cardiovascular;  Laterality: N/A;   CATARACT EXTRACTION W/ INTRAOCULAR  LENS  IMPLANT, BILATERAL     INGUINAL HERNIA REPAIR     X3   IR KYPHO LUMBAR INC FX REDUCE BONE BX UNI/BIL CANNULATION INC/IMAGING  11/04/2021   LUMBAR LAMINECTOMY/DECOMPRESSION MICRODISCECTOMY N/A 02/12/2017   Procedure: Lumbar Three-Four, Lumbar Four-Five, Lumbar Five-Sacral One Laminectomy and Foraminotomy;  Surgeon: Julio Sicks, MD;  Location: [facility] OR;  Service: Neurosurgery;  Laterality: N/A;   OTHER SURGICAL HISTORY     microwave procedure for  prostate - 09/12    PROSTATE ABLATION     microwave   REPLACEMENT TOTAL KNEE     TEE WITHOUT CARDIOVERSION N/A 03/10/2022   Procedure: TRANSESOPHAGEAL ECHOCARDIOGRAM (TEE);  Surgeon: Lewayne Bunting, MD;  Location: [facility] ENDOSCOPY;  Service: Cardiovascular;  Laterality: N/A;   THYROIDECTOMY     TONSILLECTOMY AND ADENOIDECTOMY     TOTAL KNEE ARTHROPLASTY  05/11/2011   Procedure: TOTAL KNEE ARTHROPLASTY;  Surgeon: Loanne Drilling;  Location: [facility] ORS;  Service: Orthopedics;  Laterality: Right;   US ECHOCARDIOGRAPHY  11/10/2006   EF 55-60%   US ECHOCARDIOGRAPHY  09/20/2002   EF 65-70%    Social History   Socioeconomic History   Marital status: Married    Spouse name: Not on file   Number of children: 3   Years of education: Not on file   Highest education level: Not on file  Occupational History   Occupation: retired    Associate Professor: RETIRED    Comment: [employer]  Tobacco Use   Smoking status: Never   Smokeless tobacco: Never  Vaping Use   Vaping status: Never Used  Substance and Sexual Activity   Alcohol use: Yes    Alcohol/week: 7.0 standard drinks of alcohol    Types: 7 Shots of liquor per week    Comment: 2 drinks most days   Drug use: No   Sexual activity: Never    Birth control/protection: None  Other Topics Concern   Not on file  Social History Narrative   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 [employer]      Home Situation: living with wife      Spiritual Beliefs: episcopalian      Lifestyle: walks daily, working on diet            Social Determinants of Health   Financial Resource Strain: Low Risk  (09/10/2020)   Overall Financial Resource Strain (CARDIA)    Difficulty of Paying Living Expenses: Not hard at all  Food Insecurity: No Food Insecurity (09/10/2020)   Hunger Vital Sign    Worried About Running Out of Food in the Last Year: Never true    Ran Out of Food in the Last Year: Never true  Transportation Needs: No Transportation  Needs (09/10/2020)   PRAPARE - Administrator, Civil Service (Medical): No    Lack of Transportation (Non-Medical): No  Physical Activity: Inactive (09/10/2020)   Exercise Vital Sign    Days of Exercise per Week: 0 days    Minutes of Exercise per Session: 0 min  Stress: No Stress Concern Present (09/10/2020)   [employer] of Occupational Health - Occupational Stress Questionnaire    Feeling of Stress : Not at all  Social Connections: Not on file  Intimate Partner Violence: Not on file    Family History  Problem Relation Age of Onset   Hypertension Mother    Arthritis Mother    Diabetes Mother    Hypertension Father    Stroke  Father    Kidney disease Father    Hypertension Brother    Diabetes Brother     ROS: no fevers or chills, productive cough, hemoptysis, dysphasia, odynophagia, melena, hematochezia, dysuria, hematuria, rash, seizure activity, orthopnea, PND, pedal edema, claudication. Remaining systems are negative.  Physical Exam: Well-developed frail in no acute distress.  Skin is warm and dry.  HEENT is normal.  Neck is supple.  Chest is clear to auscultation with normal expansion.  Cardiovascular exam is regular rate and rhythm.  3/6 systolic murmur left sternal border. Abdominal exam nontender or distended. No masses palpated. Extremities show no edema. neuro grossly intact  A/P  1 paroxysmal atrial fibrillation-patient remains in sinus rhythm on exam.  Continue amiodarone.  As outlined in previous notes I am hesitant to anticoagulate as I feel risk outweighs benefit given recurrent falls.  Check TSH, liver functions, chest x-ray.  2 aortic stenosis-moderate on most recent echocardiogram.  Will repeat study September 2024.  Given his progressive decline in memory and age not clear to me that he would ever be a candidate for TAVR.  3 hypertension-blood pressure controlled.    4 prior TIA-given his progressive difficulties with memory and frequent  falls I have elected to discontinue anticoagulation despite the higher risk of embolic event.  Risk outweighs benefit.  5 hyperlipidemia-continue diet.  Olga Millers, MD

## 2023-01-07 ENCOUNTER — Encounter: Payer: Self-pay | Admitting: Cardiology

## 2023-01-07 ENCOUNTER — Ambulatory Visit: Payer: Medicare Other | Attending: Cardiology | Admitting: Cardiology

## 2023-01-07 VITALS — BP 130/70 | HR 84 | Ht 73.0 in | Wt 173.0 lb

## 2023-01-07 DIAGNOSIS — I350 Nonrheumatic aortic (valve) stenosis: Secondary | ICD-10-CM | POA: Insufficient documentation

## 2023-01-07 DIAGNOSIS — I480 Paroxysmal atrial fibrillation: Secondary | ICD-10-CM | POA: Diagnosis present

## 2023-01-07 DIAGNOSIS — I119 Hypertensive heart disease without heart failure: Secondary | ICD-10-CM | POA: Insufficient documentation

## 2023-01-07 NOTE — Patient Instructions (Addendum)
    Testing/Procedures:  A chest x-ray takes a picture of the organs and structures inside the chest, including the heart, lungs, and blood vessels. This test can show several things, including, whether the heart is enlarges; whether fluid is building up in the lungs; and whether pacemaker / defibrillator leads are still in place. [facility] -[address]  Your physician has requested that you have an echocardiogram. Echocardiography is a painless test that uses sound waves to create images of your heart. It provides your doctor with information about the size and shape of your heart and how well your heart's chambers and valves are working. This procedure takes approximately one hour. There are no restrictions for this procedure. Please do NOT wear cologne, perfume, aftershave, or lotions (deodorant is allowed). Please arrive 15 minutes prior to your appointment time. [address]   Follow-Up: At [facility], you and your health needs are our priority.  As part of our continuing mission to provide you with exceptional heart care, we have created designated Provider Care Teams.  These Care Teams include your primary Cardiologist (physician) and Advanced Practice Providers (APPs -  Physician Assistants and Nurse Practitioners) who all work together to provide you with the care you need, when you need it.  We recommend signing up for the patient portal called "MyChart".  Sign up information is provided on this After Visit Summary.  MyChart is used to connect with patients for Virtual Visits (Telemedicine).  Patients are able to view lab/test results, encounter notes, upcoming appointments, etc.  Non-urgent messages can be sent to your provider as well.   To learn more about what you can do with MyChart, go to [email or web address].    Your next appointment:   6 month(s)  Provider:   Olga Millers, MD

## 2023-01-11 ENCOUNTER — Encounter: Payer: Self-pay | Admitting: *Deleted

## 2023-01-18 ENCOUNTER — Ambulatory Visit: Admission: RE | Admit: 2023-01-18 | Payer: Medicare Other | Source: Ambulatory Visit

## 2023-01-18 DIAGNOSIS — I480 Paroxysmal atrial fibrillation: Secondary | ICD-10-CM

## 2023-01-25 ENCOUNTER — Other Ambulatory Visit: Payer: Self-pay | Admitting: *Deleted

## 2023-01-25 ENCOUNTER — Encounter: Payer: Self-pay | Admitting: Cardiology

## 2023-01-25 DIAGNOSIS — R9389 Abnormal findings on diagnostic imaging of other specified body structures: Secondary | ICD-10-CM

## 2023-01-26 ENCOUNTER — Ambulatory Visit: Payer: Medicare Other | Admitting: Podiatry

## 2023-01-27 ENCOUNTER — Ambulatory Visit (HOSPITAL_COMMUNITY): Payer: Medicare Other

## 2023-01-29 ENCOUNTER — Ambulatory Visit
Admission: RE | Admit: 2023-01-29 | Discharge: 2023-01-29 | Disposition: A | Discharge status: Home / Self Care | Payer: Medicare Other | Source: Ambulatory Visit | Attending: Cardiology | Admitting: Cardiology

## 2023-01-29 ENCOUNTER — Other Ambulatory Visit: Payer: Self-pay

## 2023-01-29 ENCOUNTER — Telehealth: Payer: Self-pay | Admitting: Cardiology

## 2023-01-29 ENCOUNTER — Other Ambulatory Visit: Payer: Self-pay | Admitting: Cardiology

## 2023-01-29 DIAGNOSIS — R9389 Abnormal findings on diagnostic imaging of other specified body structures: Secondary | ICD-10-CM

## 2023-01-29 NOTE — Telephone Encounter (Signed)
New Message:      Patient's wife would like for Stanton Kidney to please call her. She said patient was unable to get his repeat chest x-ray.

## 2023-01-29 NOTE — Telephone Encounter (Signed)
Patient wife called and states issue with the order.  Chest xray is generally released by the radiology team (per patient nurse).  However it is released at this time and wife notified by call center

## 2023-02-01 ENCOUNTER — Ambulatory Visit (HOSPITAL_COMMUNITY): Payer: Medicare Other | Attending: Cardiology

## 2023-02-01 DIAGNOSIS — I350 Nonrheumatic aortic (valve) stenosis: Secondary | ICD-10-CM | POA: Insufficient documentation

## 2023-02-01 LAB — ECHOCARDIOGRAM COMPLETE
AR max vel: 1.12 cm2
AV Area VTI: 1.24 cm2
AV Area mean vel: 1.05 cm2
AV Mean grad: 21.5 mmHg
AV Peak grad: 42.4 mmHg
Ao pk vel: 3.26 m/s
Area-P 1/2: 1.2 cm2
S' Lateral: 1.7 cm

## 2023-02-03 ENCOUNTER — Encounter: Payer: Self-pay | Admitting: *Deleted

## 2023-02-25 ENCOUNTER — Encounter: Payer: Self-pay | Admitting: Podiatry

## 2023-02-25 ENCOUNTER — Ambulatory Visit (INDEPENDENT_AMBULATORY_CARE_PROVIDER_SITE_OTHER): Payer: Medicare Other | Admitting: Podiatry

## 2023-02-25 DIAGNOSIS — M79676 Pain in unspecified toe(s): Secondary | ICD-10-CM

## 2023-02-25 DIAGNOSIS — B351 Tinea unguium: Secondary | ICD-10-CM | POA: Diagnosis not present

## 2023-02-25 DIAGNOSIS — E1140 Type 2 diabetes mellitus with diabetic neuropathy, unspecified: Secondary | ICD-10-CM | POA: Diagnosis not present

## 2023-02-25 DIAGNOSIS — Z7901 Long term (current) use of anticoagulants: Secondary | ICD-10-CM

## 2023-02-25 NOTE — Progress Notes (Signed)
Subjective: Chief Complaint  Patient presents with   Diabetes    RM13: Patient is here for routine [facility]    89 y.o. returns the office today for painful, elongated, thickened toenails which he cannot trim himself. Denies any redness or drainage around the nails.  No other lower extremity complaints today.    He is no longer on Eliquis   PCP: Corwin Levins, MD A1c: 7.4 on 05/27/2022  Objective: NAD DP/PT pulses palpable, CRT less than 3 seconds Nails hypertrophic, dystrophic, elongated, brittle, discolored 10. There is tenderness overlying the nails 1-5 bilaterally. There is no surrounding erythema or drainage along the nail sites. Dry skin present without any skin fissures or open lesions. No pain with calf compression, swelling, warmth, erythema. Overall, stable  Assessment: Patient presents with symptomatic onychomycosis  Plan: -Treatment options including alternatives, risks, complications were discussed -Nails sharply debrided 10 without complication/bleeding. -Recommend moisturizer daily but do not apply interdigitally. -Discussed daily foot inspection. If there are any changes, to call the office immediately.  -Follow-up in 3 months or sooner if any problems are to arise. In the meantime, encouraged to call the office with any questions, concerns, changes symptoms.   Ovid Curd, DPM

## 2023-03-05 ENCOUNTER — Other Ambulatory Visit: Payer: Self-pay | Admitting: Cardiology

## 2023-03-10 ENCOUNTER — Telehealth: Payer: Self-pay | Admitting: Cardiology

## 2023-03-10 MED ORDER — AMIODARONE HCL 200 MG PO TABS: 200.0000 mg | ORAL_TABLET | Freq: Every day | ORAL | 0 refills | Status: DC

## 2023-03-10 NOTE — Telephone Encounter (Signed)
Pt's medication was sent to pt's pharmacy as requested. Confirmation received.  °

## 2023-03-10 NOTE — Telephone Encounter (Signed)
*  STAT* If patient is at the pharmacy, call can be transferred to refill team.   1. Which medications need to be refilled? (please list name of each medication and dose if known) amiodarone (PACERONE) 200 MG tablet      4. Which pharmacy/location (including street and city if local pharmacy) is medication to be sent to?  [facility] [address], [place] - [address] Phone: [phone]  Fax: [phone]     5. Do they need a 30 day or 90 day supply? 15 day supply - [facility] CALLED IN STATING PT'S MEDICATION IS ON THE WAY BUT NEEDS A SMALLER SUPPLY TO HOLD HIM OVER UNTIL IT IS DELIVERED.

## 2023-04-13 DIAGNOSIS — I10 Essential (primary) hypertension: Secondary | ICD-10-CM | POA: Diagnosis not present

## 2023-04-13 DIAGNOSIS — E039 Hypothyroidism, unspecified: Secondary | ICD-10-CM | POA: Diagnosis not present

## 2023-04-13 DIAGNOSIS — F03918 Unspecified dementia, unspecified severity, with other behavioral disturbance: Secondary | ICD-10-CM | POA: Insufficient documentation

## 2023-04-13 DIAGNOSIS — R4689 Other symptoms and signs involving appearance and behavior: Secondary | ICD-10-CM | POA: Insufficient documentation

## 2023-04-13 LAB — HM DIABETES EYE EXAM

## 2023-04-14 ENCOUNTER — Encounter (HOSPITAL_COMMUNITY): Payer: Self-pay | Admitting: Psychiatry

## 2023-04-14 ENCOUNTER — Other Ambulatory Visit: Payer: Self-pay

## 2023-04-14 ENCOUNTER — Emergency Department (HOSPITAL_COMMUNITY)
Admission: EM | Admit: 2023-04-14 | Discharge: 2023-04-14 | Disposition: A | Discharge status: Home / Self Care | Payer: Medicare Other | Attending: Emergency Medicine | Admitting: Emergency Medicine

## 2023-04-14 DIAGNOSIS — R4689 Other symptoms and signs involving appearance and behavior: Secondary | ICD-10-CM

## 2023-04-14 DIAGNOSIS — F03918 Unspecified dementia, unspecified severity, with other behavioral disturbance: Secondary | ICD-10-CM | POA: Diagnosis present

## 2023-04-14 LAB — COMPREHENSIVE METABOLIC PANEL
ALT: 15 U/L (ref 0–44)
AST: 24 U/L (ref 15–41)
Albumin: 3.8 g/dL (ref 3.5–5.0)
Alkaline Phosphatase: 71 U/L (ref 38–126)
Anion gap: 13 (ref 5–15)
BUN: 31 mg/dL — ABNORMAL HIGH (ref 8–23)
CO2: 22 mmol/L (ref 22–32)
Calcium: 9.2 mg/dL (ref 8.9–10.3)
Chloride: 106 mmol/L (ref 98–111)
Creatinine, Ser: 1.68 mg/dL — ABNORMAL HIGH (ref 0.61–1.24)
GFR, Estimated: 38 mL/min — ABNORMAL LOW (ref >60)
Glucose, Bld: 167 mg/dL — ABNORMAL HIGH (ref 70–99)
Potassium: 4.3 mmol/L (ref 3.5–5.1)
Sodium: 141 mmol/L (ref 135–145)
Total Bilirubin: 0.7 mg/dL (ref 0.3–1.2)
Total Protein: 6.9 g/dL (ref 6.5–8.1)

## 2023-04-14 LAB — CBC
HCT: 41 % (ref 39.0–52.0)
Hemoglobin: 13.4 g/dL (ref 13.0–17.0)
MCH: 32.3 pg (ref 26.0–34.0)
MCHC: 32.7 g/dL (ref 30.0–36.0)
MCV: 98.8 fL (ref 80.0–100.0)
Platelets: 235 10*3/uL (ref 150–400)
RBC: 4.15 MIL/uL — ABNORMAL LOW (ref 4.22–5.81)
RDW: 13.2 % (ref 11.5–15.5)
WBC: 10.6 10*3/uL — ABNORMAL HIGH (ref 4.0–10.5)
nRBC: 0 % (ref 0.0–0.2)

## 2023-04-14 LAB — ETHANOL: Alcohol, Ethyl (B): <10 mg/dL (ref <10)

## 2023-04-14 LAB — ACETAMINOPHEN LEVEL: Acetaminophen (Tylenol), Serum: <10 ug/mL — ABNORMAL LOW (ref 10–30)

## 2023-04-14 LAB — SALICYLATE LEVEL: Salicylate Lvl: <7 mg/dL — ABNORMAL LOW (ref 7.0–30.0)

## 2023-04-14 MED ORDER — LORAZEPAM 2 MG/ML IJ SOLN
2.0000 mg | Freq: Once | INTRAMUSCULAR | Status: AC
  Administered 2023-04-14: 2 mg via INTRAMUSCULAR
  Filled 2023-04-14: qty 1

## 2023-04-14 MED ORDER — LATANOPROST 0.005 % OP SOLN
1.0000 [drp] | Freq: Every day | OPHTHALMIC | Status: DC
  Filled 2023-04-14: qty 2.5

## 2023-04-14 MED ORDER — LORAZEPAM 0.5 MG PO TABS: 0.5000 mg | ORAL_TABLET | Freq: Once | ORAL | Status: DC

## 2023-04-14 MED ORDER — AMIODARONE HCL 200 MG PO TABS
200.0000 mg | ORAL_TABLET | Freq: Every day | ORAL | Status: DC
  Administered 2023-04-14: 200 mg via ORAL
  Filled 2023-04-14: qty 1

## 2023-04-14 MED ORDER — LEVOTHYROXINE SODIUM 25 MCG PO TABS
125.0000 ug | ORAL_TABLET | Freq: Every day | ORAL | Status: DC
  Administered 2023-04-14: 125 ug via ORAL
  Filled 2023-04-14: qty 1

## 2023-04-14 MED ORDER — FAMOTIDINE 20 MG PO TABS
20.0000 mg | ORAL_TABLET | Freq: Two times a day (BID) | ORAL | Status: DC
  Administered 2023-04-14 (×2): 20 mg via ORAL
  Filled 2023-04-14 (×2): qty 1

## 2023-04-14 MED ORDER — METFORMIN HCL 500 MG PO TABS
1000.0000 mg | ORAL_TABLET | Freq: Every day | ORAL | Status: DC
  Administered 2023-04-14: 1000 mg via ORAL
  Filled 2023-04-14: qty 2

## 2023-04-14 MED ORDER — DIPHENHYDRAMINE HCL 25 MG PO CAPS
25.0000 mg | ORAL_CAPSULE | Freq: Once | ORAL | Status: AC
  Administered 2023-04-14: 25 mg via ORAL
  Filled 2023-04-14: qty 1
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
## 2023-04-14 NOTE — BH Assessment (Signed)
TTS reached out to [facility] via secure chat for an update at 0534. [facility] coordinator, Maralyn Sago,  stated "I do not have an immediate consult time at the moment." Consult and provider name still pending.
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
## 2023-04-14 NOTE — Discharge Instructions (Signed)
Please follow-up with your primary doctor and the outpatient psychiatry team as they recommended.  If any symptoms change or worsen acutely, return to the nearest emergency department.
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
## 2023-04-14 NOTE — ED Notes (Signed)
Pt attempting to get out of bed. Cursing at staff.

## 2023-04-14 NOTE — BH Assessment (Signed)
TTS consult has been deferred to [facility]. [facility] Coordinator will reach out in secure chat with provider name and assessment time.

## 2023-04-14 NOTE — ED Notes (Signed)
IVC paperwork given to [place]

## 2023-04-14 NOTE — ED Notes (Signed)
Patient uncooperative unable to get discharge vitals.

## 2023-04-14 NOTE — ED Notes (Signed)
Pt began getting agitated and attempting to get out of bed.Attempted to calm patient but pt still wanting to get out of bed. PA advised
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
## 2023-04-14 NOTE — ED Notes (Signed)
IVC: Original in red folder 3 copies on clipboard in orange zone Envelope #[id number]

## 2023-04-14 NOTE — ED Provider Notes (Addendum)
Emergency Medicine Observation Re-evaluation Note  Stephen Wilkinson is a 89 y.o. male, seen on rounds today.  Pt initially presented to the ED for complaints of Psychiatric Evaluation Currently, the patient is resting comfortably in bed without agitation.  Physical Exam  BP (!) 162/74 (BP Location: Left Arm)   Pulse 65   Temp 97.9 F (36.6 C)   Resp 17   SpO2 98%  Physical Exam General: Resting in bed Cardiac: Was not tachycardic on last vital signs and on my evaluation Lungs: Clear bilaterally Psych: No acute agitation at this time  ED Course / MDM  EKG:   I have reviewed the labs performed to date as well as medications administered while in observation.  Recent changes in the last 24 hours include none reported by nursing.  Patient was trying to get out of bed and nursing asked for a dose of oral medication to not significantly sedate patient but to help him remain safe for psychiatric evaluation.  Will order a lower dose of oral Ativan.  Plan  Current plan is for awaiting placement.    Stephen Wilkinson, Canary Brim, MD 04/14/23 1159  12:41 PM Psychiatry felt patient was psychiatrically clear so they recommended discontinuing involuntary commitment.  Will do this.  They also recommended TOC discussed with family about disposition and discharge.    Stephen Wilkinson, Canary Brim, MD 04/14/23 [phone]

## 2023-04-14 NOTE — ED Notes (Signed)
Patient getting out of bed, has been redirected many times. He is ready to go home. Wife has been called to pick the patient up since he is going to be discharged. Wife stated " I will get there when I get there" and hung up.

## 2023-04-27 ENCOUNTER — Telehealth: Payer: Self-pay | Admitting: Internal Medicine

## 2023-04-27 NOTE — Telephone Encounter (Signed)
Pt called very rude and disrespectful over the phone with trying to get a scheduled appointment. When I said my greeting she interrupt me with a bad tone and said "YOU NEED TO SPEAK UP!" So I repeated my greeting and she got rude again like I was trying to be with her when that wasn't the case.. I then disconnected the call because maybe something was wrong with my phone nor will I tolerate anyone yelling at me when I am here to help..  She then calls back I said my greeting and this time she heard it was me and allowed me to finish my greeting then proceeded to argue at me again "DONT YOU EVER HANG UP ON ME AGAIN!" Instead of escalation, I wanted to satisfy her regardless and start over so I asked her how can I help you and apologized as well "because you mentioned you couldn't hear me".  She ask me for my name I gave her my name .Then said what she wanted which was to make an appointment. I then asked her for her phone number because we still didn't get her husband pulled up. She then got snappy again and said "I JUST CALLED YOU ITS ON THE CALLER ID" I then said maybe this will help, what is the patients name and DOB?  However, I tried to give her days and times that Dr Melvyn Novas had available she was not okay with them.  She then tells me she will have no other choice but to be okay with 11.21.24 she also asked me again if there is any earlier appointments available. I told her no ma'am we only have the days and times I just mentioned to you but we do have other doctors in office if you want an earlier appointment. Pt was not okay with that she wants him to see his Dr which is Dr Jonny Ruiz and will take the 21st and would like for someone to call her if she can get him fit in earlier than the 21st if possible.

## 2023-05-06 ENCOUNTER — Ambulatory Visit (INDEPENDENT_AMBULATORY_CARE_PROVIDER_SITE_OTHER): Payer: Medicare Other | Admitting: Internal Medicine

## 2023-05-06 VITALS — BP 134/86 | HR 85 | Ht 73.0 in | Wt 173.5 lb

## 2023-05-06 DIAGNOSIS — R269 Unspecified abnormalities of gait and mobility: Secondary | ICD-10-CM | POA: Diagnosis not present

## 2023-05-06 DIAGNOSIS — R413 Other amnesia: Secondary | ICD-10-CM

## 2023-05-06 DIAGNOSIS — N1831 Chronic kidney disease, stage 3a: Secondary | ICD-10-CM

## 2023-05-06 DIAGNOSIS — E1142 Type 2 diabetes mellitus with diabetic polyneuropathy: Secondary | ICD-10-CM | POA: Diagnosis not present

## 2023-05-06 DIAGNOSIS — F419 Anxiety disorder, unspecified: Secondary | ICD-10-CM

## 2023-05-06 DIAGNOSIS — Z7984 Long term (current) use of oral hypoglycemic drugs: Secondary | ICD-10-CM

## 2023-05-06 DIAGNOSIS — R531 Weakness: Secondary | ICD-10-CM

## 2023-05-06 DIAGNOSIS — F03918 Unspecified dementia, unspecified severity, with other behavioral disturbance: Secondary | ICD-10-CM

## 2023-05-06 DIAGNOSIS — R296 Repeated falls: Secondary | ICD-10-CM | POA: Diagnosis not present

## 2023-05-06 LAB — POCT GLYCOSYLATED HEMOGLOBIN (HGB A1C): Hemoglobin A1C: 6.6 % — AB (ref 4.0–5.6)

## 2023-05-06 MED ORDER — QUETIAPINE FUMARATE 25 MG PO TABS: 25.0000 mg | ORAL_TABLET | Freq: Every day | ORAL | 11 refills | Status: DC
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
## 2023-05-06 NOTE — Patient Instructions (Addendum)
Your A1c was done today  Please take all new medication as prescribed -the seroquel 25 mg twice per day - sent to [facility]; and call in 1 wk if a higher dose is needed  Please continue all other medications as before, and refills have been done if requested.  Please have the pharmacy call with any other refills you may need.  Please continue your efforts at being more active, low cholesterol diet, and weight control.  Please keep your appointments with your specialists as you may have planned  You will be contacted regarding the referral for: Physical Therapy at home, and neuropsychology  Please contact Social Services regarding help with placement  Please make an Appointment to return in 6 months, or sooner if needed

## 2023-05-07 ENCOUNTER — Other Ambulatory Visit: Payer: Self-pay | Admitting: Internal Medicine

## 2023-05-07 ENCOUNTER — Telehealth: Payer: Self-pay | Admitting: *Deleted

## 2023-05-07 DIAGNOSIS — F419 Anxiety disorder, unspecified: Secondary | ICD-10-CM

## 2023-05-07 DIAGNOSIS — I482 Chronic atrial fibrillation, unspecified: Secondary | ICD-10-CM

## 2023-05-07 DIAGNOSIS — R443 Hallucinations, unspecified: Secondary | ICD-10-CM

## 2023-05-07 DIAGNOSIS — R413 Other amnesia: Secondary | ICD-10-CM

## 2023-05-07 DIAGNOSIS — W19XXXA Unspecified fall, initial encounter: Secondary | ICD-10-CM

## 2023-05-07 MED ORDER — QUETIAPINE FUMARATE 25 MG PO TABS: 25.0000 mg | ORAL_TABLET | Freq: Two times a day (BID) | ORAL | 11 refills | Status: DC

## 2023-05-07 NOTE — Progress Notes (Signed)
  Care Coordination   Note   05/07/2023 Name: [name] MRN: [id number] DOB: [birth year]  Stephen Wilkinson is a 89 y.o. year old male who sees Corwin Levins, MD for primary care. I reached out to [name] by phone today to offer care coordination services.  [name] was given information about Care Coordination services today including:   The Care Coordination services include support from the care team which includes your Nurse Coordinator, Clinical Social Worker, or Pharmacist.  The Care Coordination team is here to help remove barriers to the health concerns and goals most important to you. Care Coordination services are voluntary, and the patient may decline or stop services at any time by request to their care team member.   Care Coordination Consent Status: Patient agreed to services and verbal consent obtained.   Follow up plan:  Telephone appointment with care coordination team member scheduled for:  05/11/2023  Encounter Outcome:  Patient Scheduled from referral   Burman Nieves, [facility] Care Coordination Care Guide Direct Dial: [phone]

## 2023-05-08 ENCOUNTER — Encounter: Payer: Self-pay | Admitting: Internal Medicine

## 2023-05-08 DIAGNOSIS — N183 Chronic kidney disease, stage 3 unspecified: Secondary | ICD-10-CM | POA: Insufficient documentation

## 2023-05-08 NOTE — Assessment & Plan Note (Signed)
Mild worsening recently, for [facility] with RN and PT

## 2023-05-08 NOTE — Assessment & Plan Note (Signed)
Worsening recently, delcines ssri

## 2023-05-08 NOTE — Assessment & Plan Note (Signed)
Without injury, for PT as above

## 2023-05-08 NOTE — Assessment & Plan Note (Signed)
Also for PT with home health

## 2023-05-08 NOTE — Assessment & Plan Note (Signed)
Lab Results  Component Value Date   CREATININE 1.68 (H) 04/14/2023   Stable overall, cont to avoid nephrotoxins

## 2023-05-08 NOTE — Assessment & Plan Note (Addendum)
Worsening, for add seroquel 25 bid, and refer neuropsychology, also for Soc Services for urgent placement if possible

## 2023-05-08 NOTE — Assessment & Plan Note (Signed)
Lab Results  Component Value Date   HGBA1C 6.6 (A) 05/06/2023   Stable, pt to continue current medical treatment metformin 1000 qd

## 2023-05-10 ENCOUNTER — Ambulatory Visit: Payer: Self-pay | Admitting: Licensed Clinical Social Worker

## 2023-05-10 ENCOUNTER — Encounter: Payer: Self-pay | Admitting: Licensed Clinical Social Worker

## 2023-05-10 ENCOUNTER — Telehealth: Payer: Self-pay | Admitting: Licensed Clinical Social Worker

## 2023-05-10 NOTE — Patient Outreach (Signed)
  Care Coordination   Initial Visit Note   05/10/2023 Name: Stephen Wilkinson MRN: [id number] DOB: Sep 20, 1928  Stephen Wilkinson is a 89 y.o. year old male who sees Stephen Levins, MD for primary care. I spoke with  Stephen Wilkinson by phone today.  What matters to the patients health and wellness today?  Stephen Wilkinson is requesting placment for Stephen Wilkinson     Goals Addressed             This Visit's Progress    Care Coordination       Activities and task to complete in order to accomplish goals.   Review private pay home care options provided and discussed Review insurance policy to see if you have Long-term care insurance or a custodial care benefit Stephen Wilkinson is requesting assistance with placement .          SDOH assessments and interventions completed:  Yes     Care Coordination Interventions:  Yes, provided  Interventions Today    Flowsheet Row Most Recent Value  Chronic Disease   Chronic disease during today's visit Other  [Memory loss]  General Interventions   General Interventions Discussed/Reviewed Level of Care  Level of Care Skilled Nursing Facility  [Stephen Wilkinson is requesting placement for pt Montavious Wilkinson]  Safety Interventions   Safety Discussed/Reviewed Fall Risk       Follow up plan: Follow up call scheduled for 05/12/2023    Encounter Outcome:  Patient Visit Completed   Stephen Wilkinson MSW, LCSW Licensed Clinical Social Worker  [facility], Population Health Direct Dial: [phone]  Fax: [phone]

## 2023-05-10 NOTE — Patient Outreach (Signed)
  Care Coordination   05/10/2023 Name: Stephen Wilkinson MRN: [id number] DOB: [birth year]   Care Coordination Outreach Attempts:  An unsuccessful telephone outreach was attempted today to offer the patient information about available care coordination services.  Follow Up Plan:  Additional outreach attempts will be made to offer the patient care coordination information and services.   Encounter Outcome:  No Answer   Care Coordination Interventions:  No, not indicated    LCSW A Shameeka Silliman called pt at 11:02am, 11:29am and 11:40am left messages to [phone].   Gwyndolyn Saxon MSW, LCSW Licensed Clinical Social Worker  [facility], Population Health Direct Dial: [phone]  Fax: [phone]

## 2023-05-18 ENCOUNTER — Ambulatory Visit: Payer: Self-pay | Admitting: Licensed Clinical Social Worker

## 2023-05-19 NOTE — Patient Outreach (Signed)
  Care Coordination   Follow Up Visit Note   05/19/2023 Name: Stephen Wilkinson MRN: [id number] DOB: [birth year]  Stephen Wilkinson is a 89 y.o. year old male who sees Corwin Levins, MD for primary care. I spoke with  [name] spouse Ms. Montenegro by phone today.  What matters to the patients health and wellness today?  Advised Ms. Curt Dr. Jonny Ruiz office received the [facility] and completion of form is expected in 10 business days.    Goals Addressed   None     SDOH assessments and interventions completed:  No     Care Coordination Interventions:  No, not indicated   Follow up plan: Follow up call scheduled for 06/01/2023  Encounter Outcome:  Patient Visit Completed   Gwyndolyn Saxon MSW, LCSW Licensed Clinical Social Worker  [facility], Population Health Direct Dial: [phone]  Fax: [phone]

## 2023-05-27 ENCOUNTER — Ambulatory Visit (INDEPENDENT_AMBULATORY_CARE_PROVIDER_SITE_OTHER): Payer: Medicare Other | Admitting: Podiatry

## 2023-05-27 DIAGNOSIS — B351 Tinea unguium: Secondary | ICD-10-CM

## 2023-05-27 DIAGNOSIS — M79676 Pain in unspecified toe(s): Secondary | ICD-10-CM | POA: Diagnosis not present

## 2023-05-27 DIAGNOSIS — E1140 Type 2 diabetes mellitus with diabetic neuropathy, unspecified: Secondary | ICD-10-CM | POA: Diagnosis not present

## 2023-05-29 NOTE — Progress Notes (Signed)
Subjective: No chief complaint on file.    89 y.o. returns the office today for painful, elongated, thickened toenails which he cannot trim himself. Denies any redness or drainage around the nails.  No other lower extremity complaints today.    PCP: Corwin Levins, MD- Last seen 05/06/2023 A1c: 6.6 on 05/06/2023  Objective: NAD DP/PT pulses palpable, CRT less than 3 seconds Nails hypertrophic, dystrophic, elongated, brittle, discolored 10. There is tenderness overlying the nails 1-5 bilaterally. There is no surrounding erythema or drainage along the nail sites. Dry skin present without any skin fissures or open lesions. No pain with calf compression, swelling, warmth, erythema. Walks with a cane  Assessment: Patient presents with symptomatic onychomycosis  Plan: -Treatment options including alternatives, risks, complications were discussed -Nails sharply debrided 10 without complication/bleeding. -Recommend moisturizer daily but do not apply interdigitally. -Discussed daily foot inspection. If there are any changes, to call the office immediately.  -Follow-up in 3 months or sooner if any problems are to arise. In the meantime, encouraged to call the office with any questions, concerns, changes symptoms.   Ovid Curd, DPM

## 2023-06-01 ENCOUNTER — Ambulatory Visit: Payer: Self-pay | Admitting: Licensed Clinical Social Worker

## 2023-06-01 NOTE — Patient Outreach (Addendum)
  Care Coordination   06/01/2023 Name: Stephen Wilkinson MRN: [id number] DOB: 1928/11/03   Care Coordination Outreach Attempts:  An unsuccessful telephone outreach was attempted today to offer the patient information about available complex care management services. LCSW A. Felton Clinton called [name] twice and left voicemail requesting callback  Follow Up Plan:  Additional outreach attempts will be made to offer the patient complex care management information and services.   Encounter Outcome:  No Answer   Care Coordination Interventions:  No, not indicated    Gwyndolyn Saxon MSW, LCSW Licensed Clinical Social Worker  [facility], Population Health Direct Dial: [phone]  Fax: [phone]

## 2023-06-02 ENCOUNTER — Other Ambulatory Visit: Payer: Self-pay | Admitting: Cardiology

## 2023-06-03 ENCOUNTER — Ambulatory Visit: Payer: Medicare Other | Admitting: Licensed Clinical Social Worker

## 2023-06-03 NOTE — Patient Outreach (Signed)
  Care Coordination   Follow Up Visit Note   06/03/2023 Name: Stephen Wilkinson MRN: [id number] DOB: [birth year]  Stephen Wilkinson is a 89 y.o. year old male who sees Stephen Levins, MD for primary care. I spoke with  Stephen Wilkinson spouse Stephen Wilkinson by phone today.  What matters to the patients health and wellness today?  Stephen Wilkinson is advise FL2 was not completed by provider office. FL2 was received on 11/26 and 11/27. Stephen Wilkinson advise to contact provider office. Additionally, Stephen Wilkinson was advised several times LCSW Stephen Wilkinson does not apply or select a skilled nursing facility for patients.     Goals Addressed             This Visit's Progress    Care Coordination       Activities and task to complete in order to accomplish goals.   Review private pay home care options provided and discussed Review insurance policy to see if you have Long-term care insurance or a custodial care benefit Stephen Wilkinson is requesting assistance with placement .   LCSW Stephen Wilkinson fax FL2 form to Dr. Jonny Wilkinson office on 11/26 and 11/27. LCSW Stephen Wilkinson called office several times regarding status of form. 12/17 Stephen Wilkinson front desk rep advise fl2 form was still incomplete. LCSW Stephen Wilkinson called 12/3 to check status was advise fl2 form was received and still not completed by provider.         SDOH assessments and interventions completed:  No     Care Coordination Interventions:  Yes, provided   Interventions Today    Flowsheet Row Most Recent Value  General Interventions   General Interventions Discussed/Reviewed Level of Care  Level of Care Skilled Nursing Facility  [Advised Stephen Wilkinson FL2 was faxed and recvd by Dr's office on 11/26 and 11/27. Stephen Wilkinson with Dr. Jonny Wilkinson office advise [facility] form was not completed as of 06/01/23]       Follow up plan: Follow up call scheduled for 1/9  24  Encounter Outcome:  Patient Visit Completed   Stephen Wilkinson MSW, LCSW Licensed Clinical Social Worker  [facility], Population Health Direct Dial: [phone]  Fax: [phone]

## 2023-06-03 NOTE — Patient Instructions (Signed)
Visit Information  Thank you for taking time to visit with me today. Please Redmond't hesitate to contact me if I can be of assistance to you.   Following are the goals we discussed today:   Goals Addressed             This Visit's Progress    Care Coordination       Activities and task to complete in order to accomplish goals.   Review private pay home care options provided and discussed Review insurance policy to see if you have Long-term care insurance or a custodial care benefit Ms. Hajj is requesting assistance with placement .   LCSW A. Keyuna Cuthrell fax FL2 form to Dr. Jonny Ruiz office on 11/26 and 11/27. LCSW A. Felton Clinton called office several times regarding status of form. 12/17 [facility] front desk rep advise fl2 form was still incomplete. LCSW A. Felton Clinton called 12/3 to check status was advise fl2 form was received and still not completed by provider.         Our next appointment is by telephone on 1/9/ at 11am  Please call the care guide team at [phone] if you need to cancel or reschedule your appointment.   If you are experiencing a Mental Health or Behavioral Health Crisis or need someone to talk to, please call 911   Patient verbalizes understanding of instructions and care plan provided today and agrees to view in MyChart. Active MyChart status and patient understanding of how to access instructions and care plan via MyChart confirmed with patient.     Telephone follow up appointment with care management team member scheduled for:06/24/23  Gwyndolyn Saxon MSW, LCSW Licensed Clinical Social Worker  [facility], Population Health Direct Dial: [phone]  Fax: [phone]

## 2023-06-04 ENCOUNTER — Telehealth: Payer: Self-pay

## 2023-06-04 DIAGNOSIS — Z0279 Encounter for issue of other medical certificate: Secondary | ICD-10-CM

## 2023-06-04 NOTE — Telephone Encounter (Signed)
Clinic nurse has paperwork to finish last of details and will call patient when paperwork is complete.

## 2023-06-04 NOTE — Telephone Encounter (Signed)
Copied from CRM [id number]. Topic: General - Other >> Jun 03, 2023  5:08 PM Fuller Mandril wrote: Reason for CRM: PT wife called states she was on hold for over 2 hours this morning trying to see if a form has been signed and sent back to social worker so she is able to have her husband placed. There are very serious concerns and she needs to have this done ASAP. She states the social worker has faxed the form twice with no response and is not able to place husband for behavioral health concerns until they receive response. Thank You

## 2023-06-10 ENCOUNTER — Ambulatory Visit: Payer: Self-pay | Admitting: Licensed Clinical Social Worker

## 2023-06-10 NOTE — Patient Instructions (Signed)
Visit Information  Thank you for taking time to visit with me today. Please Kay't hesitate to contact me if I can be of assistance to you.   Following are the goals we discussed today:   Goals Addressed             This Visit's Progress    COMPLETED: Care Coordination       Activities and task to complete in order to accomplish goals.   Review private pay home care options provided and discussed Review insurance policy to see if you have Long-term care insurance or a custodial care benefit Ms. Heckel is requesting assistance with placement .   LCSW A. [place] Mullett fax FL2 form to Dr. Jonny Ruiz office on 11/26 and 11/27. LCSW A. Felton Clinton called office several times regarding status of form. 12/17 Dominque front desk rep advise fl2 form was still incomplete. LCSW A. Felton Clinton called 12/3 to check status was advise fl2 form was received and still not completed by provider.           Please call the care guide team at [phone] if you need to cancel or reschedule your appointment.   If you are experiencing a Mental Health or Behavioral Health Crisis or need someone to talk to, please call 911   Patient verbalizes understanding of instructions and care plan provided today and agrees to view in MyChart. Active MyChart status and patient understanding of how to access instructions and care plan via MyChart confirmed with patient.     The patient has been provided with contact information for the care management team and has been advised to call with any health related questions or concerns.   Gwyndolyn Saxon MSW, LCSW Licensed Clinical Social Worker  [facility], Population Health Direct Dial: [phone]  Fax: [phone]

## 2023-06-10 NOTE — Patient Outreach (Signed)
  Care Coordination   Follow Up Visit Note   06/10/2023 Name: Stephen Wilkinson MRN: [id number] DOB: [birth year]  Stephen Wilkinson is a 89 y.o. year old male who sees Corwin Levins, MD for primary care. I spoke with  [name] spouse Drema Dallas by phone today.  What matters to the patients health and wellness today?  Mrs. Dutson contacted LCSW A. Crystale Giannattasio via phone to inquire about FL2 form status. Advised Mrs. Suminski PCP office has possession of the form (FL2) ,and still waiting to be completed. Once FL2 form is completed doctor's office will contact Mrs Taras for pick up or mail. LCSW A. Felton Clinton thoroughly advised Mrs. Hartl LCSW A. Felton Clinton can not locate and/or secure skilled nursing facility placement for [name]. LCSW A. Felton Clinton advised Mrs. Hemphill she must contact and tour these facilities when searching for nursing home placement. LCSW A. Felton Clinton as a Research officer, political party contacted [facility] nursing facility to inquire about placement and was advise no available long-term bed are available. LCSW A Herve Haug mailed long term care directory to address on file and encouraged Mrs. Poch to contact [facility] place for mom [phone] for further guidance with placement.    Goals Addressed             This Visit's Progress    COMPLETED: Care Coordination       Activities and task to complete in order to accomplish goals.   Review private pay home care options provided and discussed Review insurance policy to see if you have Long-term care insurance or a custodial care benefit Ms. Asebedo is requesting assistance with placement .   LCSW A. Lula Michaux fax FL2 form to Dr. Jonny Ruiz office on 11/26 and 11/27. LCSW A. Felton Clinton called office several times regarding status of form. 12/17 Dominque front desk rep advise fl2 form was still incomplete. LCSW A. Felton Clinton called 12/3 to check status was advise fl2 form was received and still not completed by provider.          SDOH assessments and interventions completed:   No Not during this call     Care Coordination Interventions:  Yes, provided  Interventions Today    Flowsheet Row Most Recent Value  General Interventions   General Interventions Discussed/Reviewed Level of Care  Level of Care Skilled Nursing Facility  [Pt spouse called inquiring about FL2 status.]  Education Interventions   Education Provided Provided Education, Provided Printed Education  [Mailed long term care directory to pt. Advise in full detail LCSW A. Felton Clinton does not located snf placement for patients. Advised Mrs. Freida Busman I will help w/ paperwork & caregiver support however locating & securing placement is not handled w/ outpt SW.]  Provided Verbal Education On [facility]  [Encourages Mrs. Baranek to collaborate a place for mom with placement recommendations. Long term directory mailed to Mrs. Graw.]       Follow up plan: No further intervention required.   Encounter Outcome:  Patient Visit Completed   Gwyndolyn Saxon MSW, LCSW Licensed Clinical Social Worker  [facility], Population Health Direct Dial: [phone]  Fax: [phone]

## 2023-06-11 NOTE — Telephone Encounter (Signed)
FL2 faxed to [phone]

## 2023-06-14 ENCOUNTER — Telehealth: Payer: Self-pay

## 2023-06-14 NOTE — Telephone Encounter (Signed)
Ok to send copy of FL2 to pt wife

## 2023-06-14 NOTE — Patient Instructions (Addendum)
Follow up Note   06/14/2023 Name: Stephen Wilkinson        MRN: [id number]       DOB: [birth year]     Referred by: Corwin Levins, MD Reason for referral :The patients wife seeking help with completed FL2 form, LCSW has been working with the patients wife and providing resources     LCSW, Gwyndolyn Saxon contacted [facility], [place] concerning completed FL2 form sent to her from the provider. Collaboration with the [employer] and Production designer, theatre/television/film done. In basket message sent to the pcp, clinical staff, and administrative staff asking them to contact the patients wife and let her know that the [facility] has been completed and she is able to come by the office and pick the form up or it can be mailed to her.    The LCSW has supplied the patients wife with resources in the area for long term placement and a place for mom. No further interventions from the VBCI team needed at this time.    Follow Up Plan: No further follow up required: the VBCI has completed referral and supplied the patients wife with requested information.    [place] RN, MSN, CCM Population Health Manager  [facility]  VBCI Direct Number: [phone]

## 2023-06-14 NOTE — Patient Outreach (Signed)
  Care Management   Follow Up Note   06/14/2023 Name: Traveion Rotundo MRN: [id number] DOB: 14-Mar-1929   Referred by: Corwin Levins, MD Reason for referral :The patients wife seeking help with completed FL2 form, LCSW has been working with the patients wife and providing resources   LCSW, Gwyndolyn Saxon contacted [facility], [place] concerning completed FL2 form sent to her from the provider. Collaboration with the [employer] and Production designer, theatre/television/film done. In basket message sent to the pcp, clinical staff, and administrative staff asking them to contact the patients wife and let her know that the [facility] has been completed and she is able to come by the office and pick the form up or it can be mailed to her.   The LCSW has supplied the patients wife with resources in the area for long term placement and a place for mom. No further interventions from the VBCI team needed at this time.   Follow Up Plan: No further follow up required: the VBCI has completed referral and supplied the patients wife with requested information.   [place] RN, MSN, CCM Population Health Manager  [facility]  VBCI Direct Number: [phone]

## 2023-06-15 ENCOUNTER — Other Ambulatory Visit: Payer: Self-pay | Admitting: Internal Medicine

## 2023-06-15 DIAGNOSIS — F03918 Unspecified dementia, unspecified severity, with other behavioral disturbance: Secondary | ICD-10-CM

## 2023-06-22 ENCOUNTER — Telehealth: Payer: Self-pay | Admitting: Internal Medicine

## 2023-06-22 NOTE — Telephone Encounter (Signed)
 Copied from CRM [id number]. Topic: Referral - Status >> Jun 22, 2023 11:47 AM Rolin D wrote: Reason for CRM: Tully from [facility] called onbehalf of patient stating that they have received the referral from provider for hospice care . Patients wife is requesting Dr. Norleen to serve as patients  hospice attendee . Also was advised to let provider know that patient is terminal ill . Please call Tully back with approval of attendee at [phone].

## 2023-06-22 NOTE — Telephone Encounter (Signed)
 Ok to be attending, and also ok for hospice MD to provide symptomatic meds   thanks

## 2023-06-23 NOTE — Telephone Encounter (Signed)
 Called and let her know

## 2023-06-24 ENCOUNTER — Encounter: Payer: Medicare Other | Admitting: Licensed Clinical Social Worker

## 2023-06-27 ENCOUNTER — Emergency Department (HOSPITAL_COMMUNITY)
Admission: EM | Admit: 2023-06-27 | Discharge: 2023-07-01 | Disposition: A | Discharge status: Home / Self Care | Attending: Emergency Medicine | Admitting: Emergency Medicine

## 2023-06-27 ENCOUNTER — Encounter (HOSPITAL_COMMUNITY): Payer: Self-pay

## 2023-06-27 DIAGNOSIS — F419 Anxiety disorder, unspecified: Secondary | ICD-10-CM | POA: Insufficient documentation

## 2023-06-27 DIAGNOSIS — F0390 Unspecified dementia without behavioral disturbance: Secondary | ICD-10-CM | POA: Diagnosis not present

## 2023-06-27 DIAGNOSIS — R262 Difficulty in walking, not elsewhere classified: Secondary | ICD-10-CM | POA: Insufficient documentation

## 2023-06-27 DIAGNOSIS — F02818 Dementia in other diseases classified elsewhere, unspecified severity, with other behavioral disturbance: Secondary | ICD-10-CM | POA: Insufficient documentation

## 2023-06-27 DIAGNOSIS — F32A Depression, unspecified: Secondary | ICD-10-CM | POA: Insufficient documentation

## 2023-06-27 DIAGNOSIS — F03911 Unspecified dementia, unspecified severity, with agitation: Secondary | ICD-10-CM

## 2023-06-27 LAB — CBG MONITORING, ED: Glucose-Capillary: 130 mg/dL — ABNORMAL HIGH (ref 70–99)

## 2023-06-27 MED ORDER — METFORMIN HCL 500 MG PO TABS
1000.0000 mg | ORAL_TABLET | Freq: Every day | ORAL | Status: DC
  Administered 2023-06-29 – 2023-07-01 (×3): 1000 mg via ORAL
  Filled 2023-06-27 (×3): qty 2

## 2023-06-27 MED ORDER — FAMOTIDINE 20 MG PO TABS
20.0000 mg | ORAL_TABLET | Freq: Every day | ORAL | Status: DC
  Administered 2023-06-27 – 2023-07-01 (×4): 20 mg via ORAL
  Filled 2023-06-27 (×5): qty 1

## 2023-06-27 MED ORDER — LORAZEPAM 0.5 MG PO TABS
0.5000 mg | ORAL_TABLET | Freq: Four times a day (QID) | ORAL | Status: DC | PRN
  Administered 2023-06-29: 0.5 mg via ORAL
  Filled 2023-06-27 (×2): qty 1

## 2023-06-27 MED ORDER — LORAZEPAM 2 MG/ML IJ SOLN
2.0000 mg | Freq: Once | INTRAMUSCULAR | Status: AC
  Administered 2023-06-27: 2 mg via INTRAMUSCULAR
  Filled 2023-06-27: qty 1

## 2023-06-27 MED ORDER — AMIODARONE HCL 200 MG PO TABS
200.0000 mg | ORAL_TABLET | Freq: Every day | ORAL | Status: DC
  Administered 2023-06-29 – 2023-07-01 (×3): 200 mg via ORAL
  Filled 2023-06-27 (×4): qty 1

## 2023-06-27 MED ORDER — QUETIAPINE FUMARATE 25 MG PO TABS
25.0000 mg | ORAL_TABLET | Freq: Two times a day (BID) | ORAL | Status: DC
  Administered 2023-06-27: 25 mg via ORAL
  Filled 2023-06-27 (×2): qty 1

## 2023-06-27 MED ORDER — METFORMIN HCL 500 MG PO TABS
1000.0000 mg | ORAL_TABLET | Freq: Every day | ORAL | Status: DC
  Filled 2023-06-27: qty 2

## 2023-06-27 MED ORDER — LATANOPROST 0.005 % OP SOLN
1.0000 [drp] | Freq: Every day | OPHTHALMIC | Status: DC
  Administered 2023-06-27 – 2023-06-30 (×3): 1 [drp] via OPHTHALMIC
  Filled 2023-06-27 (×2): qty 2.5

## 2023-06-27 MED ORDER — LEVOTHYROXINE SODIUM 25 MCG PO TABS
125.0000 ug | ORAL_TABLET | Freq: Every day | ORAL | Status: DC
  Administered 2023-06-28 – 2023-07-01 (×4): 125 ug via ORAL
  Filled 2023-06-27 (×4): qty 1

## 2023-06-27 NOTE — Discharge Instructions (Addendum)
Our hospice colleagues will contact you for ongoing care.  Please take all medication as recently adjusted and prescribed.  Return here for concerning changes in your condition.
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
## 2023-06-27 NOTE — ED Notes (Signed)
 Left voice message for wife to pick up patient

## 2023-06-27 NOTE — ED Notes (Signed)
 Spoke with Melissa at hospice [facility] regarding evaluation of Stephen Wilkinson and questions for Dr Jeraldine Loots. She will have Pamella Pert the social worker for hospice call with more information on this patient.
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
## 2023-06-27 NOTE — Progress Notes (Signed)
 [facility] Liaison Note  Spoke with Dr. Garrick, pt without signs/symptoms of agitation at this time and without medical complaints. No medical reason to admit patient to hospital. Due to reports of agitation, aggressive behaviors in the home and wandering patient cannot be admitted to [facility].  [facility] team will continue to monitor medication management and assist wife with placement if still desired.  Please call with any questions or concerns.  Thank you, [name], BSN, [facility]  [phone]

## 2023-06-27 NOTE — ED Notes (Addendum)
 Non emergency transport contacted to take patient home per Willeen Cass badge # [phone]

## 2023-06-28 ENCOUNTER — Other Ambulatory Visit: Payer: Self-pay

## 2023-06-28 ENCOUNTER — Emergency Department (HOSPITAL_COMMUNITY)

## 2023-06-28 DIAGNOSIS — F0390 Unspecified dementia without behavioral disturbance: Secondary | ICD-10-CM

## 2023-06-28 MED ORDER — TUBERCULIN PPD 5 UNIT/0.1ML ID SOLN
5.0000 [IU] | INTRADERMAL | Status: AC
  Administered 2023-06-28: 5 [IU] via INTRADERMAL
  Filled 2023-06-28 (×2): qty 0.1

## 2023-06-28 MED ORDER — LORAZEPAM 2 MG/ML IJ SOLN
2.0000 mg | Freq: Once | INTRAMUSCULAR | Status: AC
Start: 2023-06-28 — End: 2023-06-28
  Administered 2023-06-28: 2 mg via INTRAMUSCULAR
  Filled 2023-06-28: qty 1

## 2023-06-28 MED ORDER — DIVALPROEX SODIUM 125 MG PO CSDR: 125.0000 mg | DELAYED_RELEASE_CAPSULE | Freq: Two times a day (BID) | ORAL | Status: DC

## 2023-06-28 MED ORDER — DIVALPROEX SODIUM 125 MG PO CSDR
125.0000 mg | DELAYED_RELEASE_CAPSULE | Freq: Two times a day (BID) | ORAL | Status: DC
  Administered 2023-06-29 – 2023-07-01 (×5): 125 mg via ORAL
  Filled 2023-06-28 (×5): qty 1

## 2023-06-28 MED ORDER — QUETIAPINE FUMARATE 25 MG PO TABS
25.0000 mg | ORAL_TABLET | Freq: Two times a day (BID) | ORAL | Status: DC
  Administered 2023-06-29 – 2023-07-01 (×5): 25 mg via ORAL
  Filled 2023-06-28 (×5): qty 1

## 2023-06-28 NOTE — ED Notes (Signed)
 Xray at [facility]

## 2023-06-28 NOTE — ED Notes (Signed)
 Provider Trifan notified of patient BP 184/89. Response from provider: okay please verify his home meds if he's supposed to be on any other BP medication, we'll just watch it overnight, if it's still high tomorrow I'll start him on something, I'm back at 3 pm or have them ask the morning ED team but just make sure we have a good pharm rec done.

## 2023-06-28 NOTE — ED Notes (Signed)
 Dementia evident. Pleasantly argumentative, refusing meds, drink and food with circular arguments, non-sensical sentences, words poorly constructed with no meaning.

## 2023-06-28 NOTE — Consult Note (Signed)
 [facility] Consult Note  Patient Name: Stephen Wilkinson MRN: [id number] DOB: 1928/07/17 DATE OF Consult: 06/28/2023  PRIMARY PSYCHIATRIC DIAGNOSES neurocognitive disorder major with behavioral disturbance; rule out delirium due to general medical condition, unspecified depressive disorder by reported history; unspecified anxiety disorder by reported history  Based on my current evaluation and assessment of the patient, he is a 89 y.o. male in hospice care with increasing agitation and wandering behaviors with intermittent refusal to engage in cares (i.e. take his prescribed medications) in the context of having numerous medical comorbidities, receiving hospice care and underlying dementia. As such, to help maintain the integrity of the sleep-wake cycle to prevent further worsening of confusion and agitation secondary to nonrestorative sleep, would recommend starting patient on a regimen to help target insomnia. Moreover, per chart documentation, patient with breakthrough symptoms of agitation at midday and dinnertime given these were the times that he received lorazepam  IM to manage these symptoms during observation in the ED. Given that patient is only on twice daily dosing of quetiapine , to target these times with breakthrough agitation, would recommend scheduling additional doses of quetiapine  to help prevent this. Patient denies suicidal and homicidal intent with plan; he asserts that he feels safe in the hospital and denies further complaints. The patient's presentation is consistent with neurocognitive disorder major with behavioral disturbance; rule out delirium due to general medical condition, unspecified depressive disorder by reported history; unspecified anxiety disorder by reported history. Therefore, patient does not meet criteria for an intensive inpatient psychiatric hospitalization.  RECOMMENDATIONS   Medication recommendations:  Risks, benefits, side effects and alternatives to  treatments reviewed:  -Continue recently started divalproex  sprinkles 125 mg twice daily at 0800 and 1800 for mood stabilization -Consider increasing quetiapine  to 25 mg in the morning, with lunch, and with dinner. Would provide patient with quetiapine  50 mg at bedtime for mood stabilization and insomnia. Hold if patient is overly sedated. Please note that antipsychotics increase the risk of falls and all-cause mortality in the elderly; moreover, this medication may cause sedation when administered during the day further worsening patient's confusion/participation in cares. As patient's mentation clears, would recommend performing a wean off of this medication or when clinically appropriate given the side effect burden. Side effects include: dizziness, lightheadedness, drowsiness, nausea, vomiting, tiredness, excess saliva/drooling, blurred vision, weight gain, constipation, headache, restlessness (especially in the legs), shaking (tremor), muscle spasm, mask-like expression of the face, unusual uncontrolled movements called tardive dyskinesia (these uncontrolled movements are often of the face, mouth, tongue, arms, or legs), and trouble sleeping may occur. Note the following serious side effects: fainting, suicidal thoughts, trouble swallowing, and seizures, warranting immediate discontinuation of this medication. -Consider starting melatonin 3 mg scheduled at dinnertime for insomnia. Patient may have another dose of melatonin 3 mg at bedtime for persistent insomnia. Maintaining the sleep-wake cycle is imperative to prevent sundowning, or insomnia that is associated with worsening mental status especially for elderly with known neurocognitive deficits. Side effects associated with melatonin include headache, short-term feelings of depression, daytime sleepiness, dizziness, stomach cramps, and irritability. Melatonin is most effective if taken 1-2 hours prior to bedtime and avoiding devices that emit blue light  (i.e. TV screens, computers and cell phones  As needed medications to manage patient's acute symptoms while in hospital care: QTc is 428 ms as of 03/2023 -Maximize utilization of verbal de-escalation techniques, if attempts are unsuccessful and patient poses a threat to self and others: -First line for agitation: Consider quetiapine  50 mg three times daily as  needed for anxiety or hallucinations or thought disorganization  -Second line for agitation: Consider olanzapine 2.5 mg to 5 mg PO/IM every 8 hours as needed for severe agitation. Would offer patient the option of taking PO medication first, but if patient refuses then may administer IM medication as a last resort. Would not exceed 10 mg of olanzapine within a 24-hour period. Avoid co-administering intramuscular olanzapine with intravenous benzodiazepine, as giving both medications concurrently is associated with respiratory depression.  Non-Medication recommendations:  -Agree with continued multidisciplinary management of psychosocial, medical and mental health needs  -Note: Please stop all antipsychotic and QTc prolonging medications if patient's QTc is greater than 480 ms (except for aripiprazole, which demonstrates reduction of QTc per case reports and literature review). Of note, to decrease the risk of prolonged QTc, please maintain potassium and magnesium  levels within normal ranges.  Observation recommendations:  per unit protocol for patient with dementia prone to wandering  Recommendations: Follow-Up Telepsychiatry C/L services: We will sign off for now. Please re-consult our service if needed for any concerning changes in the patient's condition, discharge planning, or questions.  Communication: Treatment team members (and family members if applicable) who were involved in treatment/care discussions and planning, and with whom we spoke or engaged with via secure text/chat, include the following: primary team   Thank you for involving  us  in the care of this patient. If you have any additional questions or concerns, please call [phone] and ask for me or the provider on-call.  Total time spent in this encounter was 60 minutes with greater than 50% of time spent in counseling and coordination of care.  TELEPSYCHIATRY ATTESTATION & CONSENT  As the provider for this telehealth consult, I attest that I verified the patient's identity using two separate identifiers, introduced myself to the patient, provided my credentials, disclosed my location, and performed this encounter via a HIPAA-compliant, real-time, face-to-face, two-way, interactive audio and video platform and with the full consent and agreement of the patient (or guardian as applicable.)  [place]. Telehealth provider physical location: home office in state of [place].  Video start time: 2215 [facility] Time) Video end time: 2230 (Central Time)  IDENTIFYING DATA  Stephen Wilkinson is a 13 y.o. year-old male for whom a psychiatric consultation has been ordered by the primary provider. The patient was identified using two separate identifiers.  CHIEF COMPLAINT/REASON FOR CONSULT  agitation  HISTORY OF PRESENT ILLNESS (HPI)  I evaluated the patient today face-to-face via secure, HIPAA-compliant telepsychiatric connection, and at the request of the primary treatment team. The reason for the telepsychiatric consultation is that the patient is a 89 year old male with a history of basal and squamous skin carcinomnia, diabetes mellitus, GERD, glaucoma, hyperlipidemia, hypertension, cataract, hypothyroidism, macular degeneration, osteoarthritis, premature ventricular contractions, stroke, chronic kidney disease stage 3a, depression, anxiety and neurocognitive disorder with behavioral disturbance who presents for psychiatric evaluation given agitation and wandering behaviors causing wife to be concerned for her safety and that of patient at home. Per chart  documentation, patient was seen in 04/2023 for this reason by primary care provide and started on quetiapine  25 mg twice daily for management of these symptoms. Patient is currently in hospice care.  Primary team is seeking psychotropic medication recommendations, safety evaluation to determine appropriateness for more intensive psychiatric services and diagnostic clarity as to the patient's presentation.   During one-on-one evaluation with this provider, patient was alert and oriented to self only. He was unable to describe the situation,  location or time. The patient did not appear to be overtly inappropriately internally preoccupied; patient's thought process was disorganized given the severity of his cognitive and memory deficits. Patient was noted to keep his eyes closed throughout the televideo session; however, he answered prompts quickly and in a concrete fashion. He denies suicidal and homicidal intent, hallucinations, and paranoid delusions. Patient asserted that he feels safe currently in the hospital. Patient is future oriented to continue to receive cares in this setting.   PAST PSYCHIATRIC HISTORY  Inpatient psychiatric treatment: per patient, denies  Outpatient mental health treatment: per patient, denies Current home psychotropic medications: per patient, denies Previous mental health diagnoses: per patient, denies Prior psychotropic medication trials: per patient, denies Suicide attempts: per patient, denies Trauma history: patient did not assert a history of abuse/exploitation  Otherwise as per HPI above.  PAST MEDICAL HISTORY  Past Medical History:  Diagnosis Date   Anxiety    B12 deficiency 09/23/2016   Blood transfusion without reported diagnosis    Cancer (HCC)    basal and squamous skin carcinoma    Cataract    Depression    Diabetes mellitus    GERD (gastroesophageal reflux disease)    Glaucoma 01/04/2013   Hx of transient ischemic attack (TIA) 01/04/2013   Hx: UTI  (urinary tract infection)    Hyperlipidemia    Hypertension    Hypothyroidism    Insomnia    Macular degeneration    Neuropathy    OA (osteoarthritis)    Pneumonia    PVC's (premature ventricular contractions)    Renal mass    Stroke (HCC)    mid 1990s      HOME MEDICATIONS  Facility Ordered Medications  Medication   amiodarone  (PACERONE ) tablet 200 mg   famotidine  (PEPCID ) tablet 20 mg   latanoprost  (XALATAN ) 0.005 % ophthalmic solution 1 drop   levothyroxine  (SYNTHROID ) tablet 125 mcg   LORazepam  (ATIVAN ) tablet 0.5 mg   [COMPLETED] LORazepam  (ATIVAN ) injection 2 mg   metFORMIN  (GLUCOPHAGE ) tablet 1,000 mg   [COMPLETED] LORazepam  (ATIVAN ) injection 2 mg   tuberculin injection 5 Units   QUEtiapine  (SEROQUEL ) tablet 25 mg   [START ON 06/29/2023] divalproex  (DEPAKOTE  SPRINKLE) capsule 125 mg   PTA Medications  Medication Sig   famotidine  (PEPCID ) 20 MG tablet Take 1 tablet (20 mg total) by mouth 2 (two) times daily. (Patient taking differently: Take 20 mg by mouth daily.)   latanoprost  (XALATAN ) 0.005 % ophthalmic solution Place 1 drop into both eyes at bedtime.   metFORMIN  (GLUCOPHAGE ) 1000 MG tablet Take 1 tablet (1,000 mg total) by mouth daily.   levothyroxine  (SYNTHROID ) 125 MCG tablet TAKE 1 TABLET EVERY DAY BEFORE BREAKFAST   QUEtiapine  (SEROQUEL ) 25 MG tablet Take 1 tablet (25 mg total) by mouth 2 (two) times daily.   amiodarone  (PACERONE ) 200 MG tablet TAKE 1 TABLET BY MOUTH TWICE DAILY FOR  6  WEEKS (Patient taking differently: Take 200 mg by mouth daily.)   LORazepam  (ATIVAN ) 0.5 MG tablet Take 0.5 mg by mouth every 6 (six) hours as needed for anxiety.   Bevacizumab  (AVASTIN  IV) Place 1 Syringe into the right eye See admin instructions. 1 dose injected in the right eye every 7 weeks   glucose blood (ONE TOUCH ULTRA TEST) test strip Use as directed once daily to check blood sugar.  Diagnosis code E11.42 (Patient not taking: Reported on 05/06/2023)    ALLERGIES  No  Known Allergies  SOCIAL & SUBSTANCE USE HISTORY  Social History  Socioeconomic History   Marital status: Married    Spouse name: Not on file   Number of children: 3   Years of education: Not on file   Highest education level: Not on file  Occupational History   Occupation: retired    Associate Professor: RETIRED    Comment: [employer]  Tobacco Use   Smoking status: Never   Smokeless tobacco: Never  Vaping Use   Vaping status: Never Used  Substance and Sexual Activity   Alcohol use: Yes    Alcohol/week: 7.0 standard drinks of alcohol    Types: 7 Shots of liquor per week    Comment: 2 drinks most days   Drug use: No   Sexual activity: Never    Birth control/protection: None  Other Topics Concern   Not on file  Social History Narrative   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 [employer]      Home Situation: living with wife      Spiritual Beliefs: episcopalian      Lifestyle: walks daily, working on diet            Social Drivers of Health   Financial Resource Strain: Low Risk  (09/10/2020)   Overall Financial Resource Strain (CARDIA)    Difficulty of Paying Living Expenses: Not hard at all  Food Insecurity: Patient Declined (06/03/2023)   Hunger Vital Sign    Worried About Running Out of Food in the Last Year: Patient declined    Ran Out of Food in the Last Year: Patient declined  Transportation Needs: No Transportation Needs (09/10/2020)   PRAPARE - Administrator, Civil Service (Medical): No    Lack of Transportation (Non-Medical): No  Physical Activity: Inactive (09/10/2020)   Exercise Vital Sign    Days of Exercise per Week: 0 days    Minutes of Exercise per Session: 0 min  Stress: No Stress Concern Present (09/10/2020)   [employer] of Occupational Health - Occupational Stress Questionnaire    Feeling of Stress : Not at all  Social Connections: Not on file   Social History   Tobacco Use  Smoking Status Never  Smokeless  Tobacco Never   Social History   Substance and Sexual Activity  Alcohol Use Yes   Alcohol/week: 7.0 standard drinks of alcohol   Types: 7 Shots of liquor per week   Comment: 2 drinks most days   Social History   Substance and Sexual Activity  Drug Use No    FAMILY HISTORY  Family History  Problem Relation Age of Onset   Hypertension Mother    Arthritis Mother    Diabetes Mother    Hypertension Father    Stroke Father    Kidney disease Father    Hypertension Brother    Diabetes Brother     MENTAL STATUS EXAM (MSE)  Mental Status Exam: General Appearance:  hospital garb  Orientation:  Other:  to self only  Memory:  Immediate;   Poor Recent;   Poor Remote;   Poor  Concentration:  Concentration: Fair and Attention Span: Fair  Recall:  Poor  Attention  Fair  Eye Contact:  Poor  Speech:  Slow  Language:  Fair  Volume:  Normal  Mood: ok  Affect:  Constricted  Thought Process:  concrete  Thought Content:  Negative  Suicidal Thoughts:  No  Homicidal Thoughts:  No  Judgement:  Impaired  Insight:  Lacking  Psychomotor Activity:  Decreased  Akathisia:  No  Fund of Knowledge:  Poor    Assets:  Desire for Improvement  Cognition:  Impaired,  Severe  ADL's:  Impaired  AIMS (if indicated):       VITALS  Blood pressure (!) 184/89, pulse 65, temperature 97.7 F (36.5 C), temperature source Oral, resp. rate 18, height 6' 1 (1.854 m), weight 78.7 kg, SpO2 100%.  LABS  Admission on 06/27/2023  Component Date Value Ref Range Status   Glucose-Capillary 06/27/2023 130 (H)  70 - 99 mg/dL Final   Glucose reference range applies only to samples taken after fasting for at least 8 hours.    PSYCHIATRIC REVIEW OF SYSTEMS (ROS)  ROS: Notable for the following relevant positive findings: Review of Systems  Psychiatric/Behavioral:  Positive for memory loss. Negative for depression, hallucinations, substance abuse and suicidal ideas. The patient is not nervous/anxious and does not  have insomnia.     Additional findings:      Musculoskeletal: No abnormal movements observed      Gait & Station: Laying/Sitting      Pain Screening: none disclosed       Nutrition & Dental Concerns: none disclosed   RISK FORMULATION/ASSESSMENT  Is the patient experiencing any suicidal or homicidal ideations: No    Protective factors considered for safety management: current care in a highly monitored health care setting  Risk factors/concerns considered for safety management:  Depression Physical illness/chronic pain Age over 3 Impulsivity Aggression Male gender  Is there a safety management plan with the patient and treatment team to minimize risk factors and promote protective factors: Yes           Explain: continued multidisciplinary management of psychosocial, medical and mental health needs  Is crisis care placement or psychiatric hospitalization recommended: No     Based on my current evaluation and risk assessment, patient is determined at this time to be at:  Moderate Risk  *RISK ASSESSMENT Risk assessment is a dynamic process; it is possible that this patient's condition, and risk level, may change. This should be re-evaluated and managed over time as appropriate. Please re-consult psychiatric consult services if additional assistance is needed in terms of risk assessment and management. If your team decides to discharge this patient, please advise the patient how to best access emergency psychiatric services, or to call 911, if their condition worsens or they feel unsafe in any way.   Charlene Buba, MD Telepsychiatry Consult Services

## 2023-06-28 NOTE — ED Notes (Signed)
 Patient resting in bed breathing eyes closed with sitter present

## 2023-06-28 NOTE — Progress Notes (Signed)
 Transition of Care [facility]) - Emergency Department Mini Assessment   Patient Details  Name: Stephen Wilkinson MRN: [id number] Date of Birth: 16-Mar-1929  Transition of Care [facility]) CM/SW Contact:    Kari JONETTA Daisy, LCSW Phone Number: 06/28/2023, 12:41 PM   Clinical Narrative: CSW spoke with [name] [facility]) [phone] who expressed concerns about pt's behaviors and medication management. Pt is currently on hospice at home and wife is requesting respite; however, pt is not appropriate for [facility] at this time. Pt's spouse has expressed that she is unable to care for pt in the home at this time and is interested in respite. CSW discussed case with Randine who states the team will reconvene at 3:30 PM to discuss a plan.   In the meantime, CSW has spoken with EDP who agrees to place TTS consult and have pt's behaviors assessed and medications managed. CSW staffed case with Psych NP, Josephine, who will plan to see the pt and attempt to adjust medications to control pt's behaviors for possible [facility].   TOC will collaborate with [facility] to assist in memory care/long term placement if pt is not appropriate for [facility]. If pt is able to go to [facility], [facility] will continue to assist the family in finding placement. TOC to provide [facility] with FL2 once pt has been assessed by TTS to accurately reflect medication changes.   ED Mini Assessment: What brought you to the Emergency Department? : behaviors        Means of departure: Not know       Patient Contact and Communications Key Contact 1: Rodriges,Natalie (Spouse)  [phone] Key Contact 2: [name] [facility]) - [phone] Spoke with: [name] [facility]) [phone] Contact Date: 06/28/23,   Contact time: 1204 Contact Phone Number: [name] [facility]) - [phone]           Admission diagnosis:  aggression eval Patient Active Problem List   Diagnosis Date Noted   CKD (chronic kidney disease) stage 3, GFR  30-59 ml/min (HCC) 05/08/2023   Dementia with behavioral disturbance (HCC) 04/14/2023   Recurrent falls while walking 05/27/2022   Aortic stenosis 05/27/2022   Persistent atrial fibrillation (HCC)    Malnutrition of moderate degree 11/04/2021   Lumbar compression fracture, closed, initial encounter (HCC) 10/28/2021   Atrial fibrillation, chronic (HCC) 10/28/2021   Epiretinal membrane, right eye 09/24/2021   Epistaxis    Memory loss 08/29/2021   Hallucinations 08/29/2021   Acute gouty arthritis 08/29/2021   Vitamin D  deficiency 08/29/2021   Hypotension 05/20/2021   Fall 05/20/2021   Aortic atherosclerosis (HCC) 10/09/2020   Excessive cerumen in both ear canals 04/10/2020   History of vitrectomy 12/04/2019   Bilateral hip pain 11/26/2019   Tremor 11/26/2019   Low back pain 11/23/2019   Gait disorder 11/23/2019   Recurrent falls 11/23/2019   General weakness 11/23/2019   Exudative age-related macular degeneration of right eye with active choroidal neovascularization (HCC) 10/16/2019   Advanced nonexudative age-related macular degeneration of left eye with subfoveal involvement 10/16/2019   Advanced nonexudative age-related macular degeneration of right eye without subfoveal involvement 10/16/2019   Floater, vitreous, right 10/16/2019   Epiretinal membrane, left eye 10/16/2019   Hallucination 02/21/2019   Balance disorder 09/30/2018   Dizziness 09/30/2018   Anxiety 09/25/2017   Lumbar stenosis with neurogenic claudication 02/12/2017   Lumbar spinal stenosis 11/24/2016   Left sided sciatica 11/24/2016   Renal cyst 11/24/2016   Bilateral leg cramps 09/23/2016  Nasal sore 09/23/2016   Vitamin B 12 deficiency 09/23/2016   CMC arthritis, thumb, degenerative 05/26/2016   Memory dysfunction 03/24/2016   Rash and nonspecific skin eruption 11/20/2014   Cough 10/10/2014   Sciatica of left side 08/21/2014   GERD (gastroesophageal reflux disease) 01/04/2014   Hearing loss, bilateral  10/18/2013   Wrist arthritis 10/18/2013   Encounter for psychiatric assessment 03/30/2013   Impaired vision in both eyes 03/30/2013   Erectile dysfunction 03/30/2013   Skin cancer    Hyperlipidemia    Chronic cough 03/10/2013   First degree heart block 01/04/2013   DM type 2 with diabetic peripheral neuropathy (HCC) 01/04/2013   BPH (benign prostatic hyperplasia) 01/04/2013   Hx of transient ischemic attack (TIA) 01/04/2013   Glaucoma 01/04/2013   Osteoarthritis of right knee 05/12/2011   Hypothyroidism 12/25/2010   Benign hypertensive heart disease without heart failure 12/25/2010   Anxiety and depression 12/25/2010   Macular degeneration of both eyes 12/25/2010   PCP:  Norleen Lynwood ORN, MD Pharmacy:   [facility] [place], [place] - [address] [place] [address] Phone: [phone] Fax: [phone]  [facility] [address], [place] - [address] [place] [address] Phone: [phone] Fax: [phone]

## 2023-06-28 NOTE — ED Notes (Addendum)
 TTS at [facility], calmer, oriented to self, confused, semi-cooperative, interactive, mostly non-sensical, un-related  answers.

## 2023-06-28 NOTE — Care Management (Addendum)
 Spoke with psych NP who has seen patient and making medication adjustments.   Notified Randine w/[facility] that signed FL2 is on file.  TOC will continue to follow.     -4:22pm Randine with [facility] reports [facility] can not take patient tonight. Plan is to re-evaluate tomorrow for placement to [facility] or potential for memory care.  TOC will continue follow.

## 2023-06-28 NOTE — Discharge Planning (Signed)
 RNCM following up with [facility] regarding respite placement.  RNCM contacted Shawn P with [facility], who is aware of the request and advised that his Director Johnice E) will be working with us  on this case. Stephen Wilkinson is currently in a meeting regarding this and will contact me with decision of company.  RNCM will update team as information comes available.

## 2023-06-28 NOTE — ED Provider Notes (Signed)
 Emergency Medicine Observation Re-evaluation Note  Stephen Wilkinson is a 89 y.o. male, seen on rounds today.  Pt initially presented to the ED for complaints of No chief complaint on file. Currently, the patient is awake laying in bed in soft restraints, no acute complaints.  Physical Exam  BP (!) 170/90   Pulse 77   Temp 98.1 F (36.7 C) (Oral)   Resp 16   Ht 6' 1 (1.854 m)   Wt 78.7 kg   SpO2 98%   BMI 22.89 kg/m  Physical Exam General: Awake and alert, no acute distress Cardiac: Regular rate Lungs: No increased work of breathing Psych: Calm but flailing around in the bed requiring soft restraints  ED Course / MDM  EKG:   I have reviewed the labs performed to date as well as medications administered while in observation.  Recent changes in the last 24 hours include patient medically cleared.  Patient did not have inpatient admission criteria however his wife did not feel safe with him going home.  He is currently on home hospice.  Social work is involved to determine a safe disposition.  Plan  Current plan is for social work for disposition.    Kingsley, Obert Espindola K, DO 06/28/23 [phone]

## 2023-06-28 NOTE — [place] FL2 (Signed)
 [place]  MEDICAID FL2 LEVEL OF CARE FORM     IDENTIFICATION  Patient Name: Stephen Wilkinson Birthdate: [birth year] Sex: male Admission Date (Current Location): 06/27/2023  [facility] and [place] Number:  Producer, Television/film/video and Address:  [facility],  [address] [place], [place] [address]      Provider Number: [id number]  Attending Physician Name and Address:  System, Provider Not In  Relative Name and Phone Number:  Ard,Natalie (Spouse)  [phone] (Mobile)    Current Level of Care: Hospital Recommended Level of Care: Memory Care Prior Approval Number:    Date Approved/Denied:   PASRR Number:    Discharge Plan: Other (Comment) (Memory care)    Current Diagnoses: Patient Active Problem List   Diagnosis Date Noted   CKD (chronic kidney disease) stage 3, GFR 30-59 ml/min (HCC) 05/08/2023   Dementia with behavioral disturbance (HCC) 04/14/2023   Recurrent falls while walking 05/27/2022   Aortic stenosis 05/27/2022   Persistent atrial fibrillation (HCC)    Malnutrition of moderate degree 11/04/2021   Lumbar compression fracture, closed, initial encounter (HCC) 10/28/2021   Atrial fibrillation, chronic (HCC) 10/28/2021   Epiretinal membrane, right eye 09/24/2021   Epistaxis    Memory loss 08/29/2021   Hallucinations 08/29/2021   Acute gouty arthritis 08/29/2021   Vitamin D  deficiency 08/29/2021   Hypotension 05/20/2021   Fall 05/20/2021   Aortic atherosclerosis (HCC) 10/09/2020   Excessive cerumen in both ear canals 04/10/2020   History of vitrectomy 12/04/2019   Bilateral hip pain 11/26/2019   Tremor 11/26/2019   Low back pain 11/23/2019   Gait disorder 11/23/2019   Recurrent falls 11/23/2019   General weakness 11/23/2019   Exudative age-related macular degeneration of right eye with active choroidal neovascularization (HCC) 10/16/2019   Advanced nonexudative age-related macular degeneration of left eye with subfoveal involvement 10/16/2019   Advanced  nonexudative age-related macular degeneration of right eye without subfoveal involvement 10/16/2019   Floater, vitreous, right 10/16/2019   Epiretinal membrane, left eye 10/16/2019   Hallucination 02/21/2019   Balance disorder 09/30/2018   Dizziness 09/30/2018   Anxiety 09/25/2017   Lumbar stenosis with neurogenic claudication 02/12/2017   Lumbar spinal stenosis 11/24/2016   Left sided sciatica 11/24/2016   Renal cyst 11/24/2016   Bilateral leg cramps 09/23/2016   Nasal sore 09/23/2016   Vitamin B 12 deficiency 09/23/2016   CMC arthritis, thumb, degenerative 05/26/2016   Memory dysfunction 03/24/2016   Rash and nonspecific skin eruption 11/20/2014   Cough 10/10/2014   Sciatica of left side 08/21/2014   GERD (gastroesophageal reflux disease) 01/04/2014   Hearing loss, bilateral 10/18/2013   Wrist arthritis 10/18/2013   Encounter for psychiatric assessment 03/30/2013   Impaired vision in both eyes 03/30/2013   Erectile dysfunction 03/30/2013   Skin cancer    Hyperlipidemia    Chronic cough 03/10/2013   First degree heart block 01/04/2013   DM type 2 with diabetic peripheral neuropathy (HCC) 01/04/2013   BPH (benign prostatic hyperplasia) 01/04/2013   Hx of transient ischemic attack (TIA) 01/04/2013   Glaucoma 01/04/2013   Osteoarthritis of right knee 05/12/2011   Hypothyroidism 12/25/2010   Benign hypertensive heart disease without heart failure 12/25/2010   Anxiety and depression 12/25/2010   Macular degeneration of both eyes 12/25/2010    Orientation RESPIRATION BLADDER Height & Weight     Self  Normal Incontinent Weight: 78.7 kg Height:  6' 1 (185.4 cm)  BEHAVIORAL SYMPTOMS/MOOD NEUROLOGICAL BOWEL NUTRITION STATUS  Wanderer   Incontinent Diet (Regular)  AMBULATORY STATUS COMMUNICATION OF NEEDS Skin   Independent Verbally Normal                       Personal Care Assistance Level of Assistance  Bathing, Feeding, Dressing Bathing Assistance: Limited  assistance Feeding assistance: Limited assistance Dressing Assistance: Limited assistance     Functional Limitations Info  Sight          SPECIAL CARE FACTORS FREQUENCY                       Contractures Contractures Info: Not present    Additional Factors Info  Code Status, Allergies Code Status Info: Full Allergies Info: No Known Allergies           Current Medications (06/28/2023):  This is the current hospital active medication list Current Facility-Administered Medications  Medication Dose Route Frequency Provider Last Rate Last Admin   amiodarone  (PACERONE ) tablet 200 mg  200 mg Oral Daily [name], MD       [START ON 06/29/2023] divalproex  (DEPAKOTE  SPRINKLE) capsule 125 mg  125 mg Oral BID Onuoha, Josephine C, NP       famotidine  (PEPCID ) tablet 20 mg  20 mg Oral Daily [name], MD   20 mg at 06/27/23 2305   latanoprost  (XALATAN ) 0.005 % ophthalmic solution 1 drop  1 drop Both Eyes QHS [name], MD   1 drop at 06/27/23 2303   levothyroxine  (SYNTHROID ) tablet 125 mcg  125 mcg Oral Q0600 [name], MD   125 mcg at 06/28/23 0554   LORazepam  (ATIVAN ) tablet 0.5 mg  0.5 mg Oral Q6H PRN [name], MD       metFORMIN  (GLUCOPHAGE ) tablet 1,000 mg  1,000 mg Oral Q breakfast [name], MD       QUEtiapine  (SEROQUEL ) tablet 25 mg  25 mg Oral BID Onuoha, Josephine C, NP       tuberculin injection 5 Units  5 Units Intradermal STAT Kingsley, Victoria K, DO       Current Outpatient Medications  Medication Sig Dispense Refill   amiodarone  (PACERONE ) 200 MG tablet TAKE 1 TABLET BY MOUTH TWICE DAILY FOR  6  WEEKS (Patient taking differently: Take 200 mg by mouth daily.) 90 tablet 1   famotidine  (PEPCID ) 20 MG tablet Take 1 tablet (20 mg total) by mouth 2 (two) times daily. (Patient taking differently: Take 20 mg by mouth daily.) 180 tablet 3   latanoprost  (XALATAN ) 0.005 % ophthalmic solution Place 1 drop into both eyes at bedtime.      levothyroxine  (SYNTHROID ) 125 MCG tablet TAKE 1 TABLET EVERY DAY BEFORE BREAKFAST 90 tablet 3   LORazepam  (ATIVAN ) 0.5 MG tablet Take 0.5 mg by mouth every 6 (six) hours as needed for anxiety.     metFORMIN  (GLUCOPHAGE ) 1000 MG tablet Take 1 tablet (1,000 mg total) by mouth daily. 90 tablet 3   QUEtiapine  (SEROQUEL ) 25 MG tablet Take 1 tablet (25 mg total) by mouth 2 (two) times daily. 60 tablet 11   QUEtiapine  (SEROQUEL ) 50 MG tablet Take 50 mg by mouth every 6 (six) hours.     Bevacizumab  (AVASTIN  IV) Place 1 Syringe into the right eye See admin instructions. 1 dose injected in the right eye every 7 weeks     glucose blood (ONE TOUCH ULTRA TEST) test strip Use as directed once daily to check blood sugar.  Diagnosis code E11.42 (Patient not taking: Reported on 05/06/2023) 100 each 11  Discharge Medications: Please see discharge summary for a list of discharge medications.  Relevant Imaging Results:  Relevant Lab Results:   Additional Information SSN: [id number]  Stefan JONELLE Cory, RN

## 2023-06-28 NOTE — ED Notes (Signed)
 Requested for patient Xalatan to be sent from pharmacy at this time

## 2023-06-28 NOTE — ED Notes (Signed)
 Eyes closed, restless in bed, arousable to voice.

## 2023-06-28 NOTE — Progress Notes (Signed)
 CSW staffed case with Stephen Wilkinson with [facility].Stephen Wilkinson will be on standby in case memory care search needs to be initiated.

## 2023-06-28 NOTE — ED Notes (Signed)
 Restless in bed, intelligible words, non-sense statements. Intermittently attempts to get out of bed. Bed alarm on. Posey belt applied. Fall risk bracelet, door sign, and socks in place. Denies needs. Refused sip of water. Repositioned in bed

## 2023-06-28 NOTE — Consult Note (Signed)
 Psychiatry was consulted to see this patient and recommend Medication for agitation and aggression.  Patient, 89 years old with diagnosis of Dementia and other Medical issues.  Patient came from home where wife is no longer capable of taking care of him.  On arrival at the bed side patient was awake, restless and moving around in bed. He occasionally nonsensical statement.  He is oriented to his name only.  According to his RN patient tries to get out of bed.  Nurse also reported that he is not eating or drinking and is refusing oral doses of his medications.  45 Minutes before my arrival at the bed side patient was given 2 mg injection Ativan  which did not do much for patient as he was still moving around in bed attempting to get out.  He intermittently blurts out any sentence that comes to his mind. Provider called his wife, Stephen Wilkinson at home who only said   my husband has Alzheimer's Dementia.  I cannot care for him and you should have list of Medications there  Provider explained to Mrs Grotz that the reason for the call to get her permission to add Depakote  sprinkle and Seroquel  to help manage his agitation and restlessness.  She then said to go ahead and do what can help her husband.  Seroquel  25 mg twice a day was already ordered bt EDP. Depakote  sprinkle 125 mg twice a day added.  Both Seroquel  and Depakote  to be scheduled at 8 am and 6 pm everyday.

## 2023-06-28 NOTE — ED Notes (Signed)
 Sitter at bedside attempting to feed patient. Patient refusing becoming verbally/physically aggressive

## 2023-06-28 NOTE — ED Notes (Addendum)
 Refused multiple time. Was pleasantly agitated. Now derogatory, aggressive and beligerent, grabbing, swinging and kicking. Pulled pulse ox off. Verbalizes Myquan't try and steal this from me, I know you (expletive). EDP notified.

## 2023-06-29 NOTE — Progress Notes (Addendum)
 CSW filed APS report due to concern for pt's wife's inability to care for pt and concerns for withholding funds needed for pt's care (memory care/sitters/home care).   CSW spoke with pt's wife who states she is not pleased with [place]'s care and states [place] continues to hire incompetent physicians. He is not even in a room and he can't even stand up. CSW did inform pt's spouse that pt is in a room in the emergency department and that pt is not meeting criteria for inpatient admission at this time. CSW inquired if she'd expressed concerns to physicians and pt's spouse reported I haven't been to the hospital. She states she is waiting to hear back from her attorney because she is dissatisfied with the care that the pt is receiving.  CSW informed that our team has been collaborating with [facility] and [facility] to seek memory care placement. CSW inquired about finances and what amount is affordable for pt's cost of living at memory care. Pt stated his Medicare is going to pay for 90 days. And he also has long term insurance. CSW informed pt's spouse that Medicare does not pay for LTC and inquired about whether pt has ever qualified for Medicaid. Pt's spouse told this clinical research associate only sorry ass bums get Medicaid. CSW explained that in this writer's experience, the lowest out of pocket cost has been a little over $4,000 a month. Pt's wife states if I pay that then I'll become one of the street people. CSW informed that our team will continue to collaborate with [facility] and [facility].   CSW staffed case with [facility] supervisor who agrees with concerns for pt's wife potentially withholding money for pt's care. APS report filed as noted above.   Addend @ 2:32 PM CSW received call back from APS notifying that the case was screened in.

## 2023-06-29 NOTE — Progress Notes (Addendum)
 CSW was notified by [facility] that [facility] offered pt a bed and [facility] leadership met with pt's spouse who is in agreement with plan for admit to their facility on Thursday once paperwork has been completed. TOC following.   Addend @ 2:53PM CSW spoke with Ms. Draper with APS [phone]) and provided an update with information above. Ms. Arvell plans to come visit the pt and will follow up with pt's spouse in the community. Ms. Arvell states she also may attend the meeting Thursday morning with [facility]. [facility] notified. TOC following.

## 2023-06-29 NOTE — Progress Notes (Signed)
 CSW attempted to contact pt's spouse without success. Left HIPAA Compliant voicemail requesting call back.   Referral packet was sent over to [facility] with [facility].

## 2023-06-29 NOTE — ED Provider Notes (Signed)
 Emergency Medicine Observation Re-evaluation Note  Stephen Wilkinson is a 89 y.o. male, seen on rounds today.  Pt initially presented to the ED for complaints of No chief complaint on file. Currently, the patient is sleeping.  Physical Exam  BP (!) 148/86 (BP Location: Right Arm)   Pulse 70   Temp (!) 97.5 F (36.4 C) (Oral)   Resp 18   Ht 6' 1 (1.854 m)   Wt 78.7 kg   SpO2 98%   BMI 22.89 kg/m  Physical Exam General: Sleeping Cardiac: Regular rate and rhythm Lungs: No increased work of breathing Psych: Sleeping  ED Course / MDM  EKG:   I have reviewed the labs performed to date as well as medications administered while in observation.  Recent changes in the last 24 hours include none.  Plan  Current plan is for assistance with placement.  I performed the initial evaluation of this patient a few days ago, when the patient was sent from home to [facility], but was declined due to reported aggression.  Patient has notable dementia.    Stephen Charleston, MD 06/29/23 [phone]

## 2023-06-30 ENCOUNTER — Other Ambulatory Visit: Payer: Self-pay

## 2023-06-30 LAB — CBG MONITORING, ED
Glucose-Capillary: 111 mg/dL — ABNORMAL HIGH (ref 70–99)
Glucose-Capillary: 100 mg/dL — ABNORMAL HIGH (ref 70–99)

## 2023-06-30 MED ORDER — CARMEX CLASSIC LIP BALM EX OINT
TOPICAL_OINTMENT | Freq: Once | CUTANEOUS | Status: AC
  Administered 2023-06-30: 1 via TOPICAL
  Filled 2023-06-30: qty 10

## 2023-06-30 NOTE — ED Provider Notes (Signed)
 Emergency Medicine Observation Re-evaluation Note  Stephen Wilkinson is a 89 y.o. male, seen on rounds today.  Pt initially presented to the ED for complaints of No chief complaint on file. Currently, the patient is sleeping.  Physical Exam  BP (!) 157/96 (BP Location: Right Arm)   Pulse 64   Temp (!) 97.5 F (36.4 C) (Oral)   Resp 18   Ht 6\' 1"  (1.854 m)   Wt 78.7 kg   SpO2 98%   BMI 22.89 kg/m  Physical Exam General: No acute distress, sleeping Lungs: No respiratory distress Psych: Sleeping  ED Course / MDM  EKG:   I have reviewed the labs performed to date as well as medications administered while in observation.  Recent changes in the last 24 hours include none.  Plan  Current plan is for placement tomorrow.    Carin Charleston, MD 06/30/23 [phone]

## 2023-06-30 NOTE — Evaluation (Signed)
 Physical Therapy Evaluation Patient Details Name: Stephen Wilkinson MRN: [id number] DOB: 12/13/1928 Today's Date: 06/30/2023  History of Present Illness  89 yo male brought to ED 06/27/23 after [facility]. PMH: DM, glaucoma, hypothyroidism,.  Clinical Impression  The patient is alert and  participated in mobility, requiring  max support to sit up and stand at [facility], demonstrates poor balance.  Patient will benefit from PT while in acute care  to maximize strength and  mobility . If Dc plan  will be  residential Hospice or Hospice in  facility, then PT will sign off. Note that Patient has been under Hospice.        If plan is discharge home, recommend the following: Two people to help with walking and/or transfers;Two people to help with bathing/dressing/bathroom;Assist for transportation   Can travel by private vehicle   No    Equipment Recommendations None recommended by PT  Recommendations for Other Services       Functional Status Assessment Patient has had a recent decline in their functional status and/or demonstrates limited ability to make significant improvements in function in a reasonable and predictable amount of time     Precautions / Restrictions Precautions Precautions: Fall Restrictions Weight Bearing Restrictions Per Provider Order: No      Mobility  Bed Mobility Overal bed mobility: Needs Assistance Bed Mobility: Supine to Sit, Sit to Supine     Supine to sit: Max assist Sit to supine: Total assist   General bed mobility comments: patient  needs requent  cues for activity. Patient cooperated.    Transfers Overall transfer level: Needs assistance Equipment used: Rolling walker (2 wheels) Transfers: Sit to/from Stand Sit to Stand: Max assist, +2 physical assistance           General transfer comment: Patient required  3 trials to finally stand at Rw, unable to take  a step, posterior  lean.    Ambulation/Gait                   Stairs            Wheelchair Mobility     Tilt Bed    Modified Rankin (Stroke Patients Only)       Balance Overall balance assessment: Needs assistance Sitting-balance support: Feet supported, Bilateral upper extremity supported Sitting balance-Leahy Scale: Fair Sitting balance - Comments: able to support  in sitting   Standing balance support: Reliant on assistive device for balance, Bilateral upper extremity supported, During functional activity Standing balance-Leahy Scale: Zero                               Pertinent Vitals/Pain Pain Assessment Breathing: normal Negative Vocalization: none Facial Expression: smiling or inexpressive Body Language: tense, distressed pacing, fidgeting Consolability: no need to console PAINAD Score: 1    Home Living                     Additional Comments: unsure, [facility] claims unable to provide care.    Prior Function               Mobility Comments: patient unable, no family       Extremity/Trunk Assessment   Upper Extremity Assessment Upper Extremity Assessment: Generalized weakness    Lower Extremity Assessment Lower Extremity Assessment: Generalized weakness    Cervical / Trunk Assessment Cervical / Trunk Assessment: Kyphotic  Communication   Communication Communication: Difficulty  communicating thoughts/reduced clarity of speech (does state that his lips are dry and he is very cold)  Cognition Arousal: Alert Behavior During Therapy: WFL for tasks assessed/performed Overall Cognitive Status: History of cognitive impairments - at baseline                                 General Comments: orient ed to self only, follows  1 step  directions with cues  and extra time        General Comments      Exercises     Assessment/Plan    PT Assessment Patient needs continued PT services  PT Problem List Decreased strength;Decreased balance;Decreased  cognition;Decreased knowledge of precautions;Decreased mobility;Decreased activity tolerance;Decreased safety awareness       PT Treatment Interventions Therapeutic activities;DME instruction;Cognitive remediation;Gait training;Therapeutic exercise;Patient/family education;Functional mobility training    PT Goals (Current goals can be found in the Care Plan section)  Acute Rehab PT Goals PT Goal Formulation: Patient unable to participate in goal setting Time For Goal Achievement: 07/14/23 Potential to Achieve Goals: Fair    Frequency Min 1X/week     Co-evaluation               AM-PAC PT "6 Clicks" Mobility  Outcome Measure Help needed turning from your back to your side while in a flat bed without using bedrails?: Total Help needed moving from lying on your back to sitting on the side of a flat bed without using bedrails?: Total Help needed moving to and from a bed to a chair (including a wheelchair)?: Total Help needed standing up from a chair using your arms (e.g., wheelchair or bedside chair)?: Total Help needed to walk in hospital room?: Total Help needed climbing 3-5 steps with a railing? : Total 6 Click Score: 6    End of Session Equipment Utilized During Treatment: Gait belt Activity Tolerance: Patient tolerated treatment well Patient left: in bed;with bed alarm set;with call bell/phone within reach Nurse Communication: Mobility status PT Visit Diagnosis: Unsteadiness on feet (R26.81);Difficulty in walking, not elsewhere classified (R26.2);Adult, failure to thrive (R62.7)    Time: 0102-7253 PT Time Calculation (min) (ACUTE ONLY): 22 min   Charges:   PT Evaluation $PT Eval Low Complexity: 1 Low   PT General Charges $$ ACUTE PT VISIT: 1 Visit         Abelina Hoes PT Acute Rehabilitation Services Office [phone] Weekend [phone]   Dareen Ebbing 06/30/2023, 11:39 AM

## 2023-07-01 MED ORDER — METFORMIN HCL 1000 MG PO TABS: 1000.0000 mg | ORAL_TABLET | Freq: Every day | ORAL | 3 refills | Status: DC

## 2023-07-01 MED ORDER — LEVOTHYROXINE SODIUM 125 MCG PO TABS: 125.0000 ug | ORAL_TABLET | Freq: Every day | ORAL | 3 refills | Status: DC

## 2023-07-01 MED ORDER — AMIODARONE HCL 200 MG PO TABS: 200.0000 mg | ORAL_TABLET | Freq: Every day | ORAL | 0 refills | Status: DC

## 2023-07-01 MED ORDER — FAMOTIDINE 20 MG PO TABS: 20.0000 mg | ORAL_TABLET | Freq: Every day | ORAL | 0 refills | Status: DC

## 2023-07-01 MED ORDER — LORAZEPAM 0.5 MG PO TABS: 0.5000 mg | ORAL_TABLET | Freq: Four times a day (QID) | ORAL | 0 refills | Status: DC | PRN

## 2023-07-01 MED ORDER — LATANOPROST 0.005 % OP SOLN: 1.0000 [drp] | Freq: Every day | OPHTHALMIC | 0 refills | Status: DC

## 2023-07-01 MED ORDER — GLUCOSE BLOOD VI STRP: ORAL_STRIP | 11 refills | Status: DC

## 2023-07-01 NOTE — ED Provider Notes (Signed)
Emergency Medicine Observation Re-evaluation Note  Stephen Wilkinson is a 89 y.o. male, seen on rounds today.  Pt initially presented to the ED for complaints of No chief complaint on file. Currently, the patient is resting comfortably in ED bed.  Physical Exam  BP (!) 151/66 (BP Location: Left Arm)   Pulse 75   Temp 97.6 F (36.4 C) (Oral)   Resp 16   Ht 6\' 1"  (1.854 m)   Wt 78.7 kg   SpO2 99%   BMI 22.89 kg/m  Physical Exam General: Awake. Alert. No acute distress Cardiac: Regular rate rhythm Lungs: Clear to auscultation bilaterally Psych: Calm and cooperative  ED Course / MDM  EKG:   I have reviewed the labs performed to date as well as medications administered while in observation.  Recent changes in the last 24 hours include plan for placement today at [facility] house.  Plan  Current plan is for continued boarding in the ED awaiting placement at [facility] house hopefully today.    Royanne Foots, DO 07/01/23 [phone]

## 2023-07-01 NOTE — ED Notes (Signed)
Pt screaming at male staff after she came to assist pt after he removed his gown and  adult diaper. Stating he was ashamed to be see nude by male staff so he was assisted by this Clinical research associate getting dressed. It is noted that Stephen Stephen Wilkinson very easily and yells when frustrated and appears like he is going to Games developer. Stephen Wilkinson repeatedly attempting to ambulate oob w/o assistance becoming angry and started yelling at this writer when assistance was offered. Pt assisted to standing but became angry when he was unable to support his own body weight. Stephen Wilkinson began failing his arms striking this Clinical research associate and attempted to kick when he was lowered back to his bed.  Stephen Wilkinson was assisted with using the urinal and no further attempts to ambulate were attempted because of his not being able to weight bear.

## 2023-07-01 NOTE — ED Notes (Addendum)
TB skin test read 07/01/23 at 8am. Negative. No redness or puffiness/bump around site.

## 2023-07-01 NOTE — Progress Notes (Signed)
[facility] Liaison Note  AVS, negative TB test and FL2 have been sent to [facility] via secure email.  [facility] transport has been arranged for a 12pm pickup.  Bedside staff please call report to [facility].  Please send completed and signed DNR with patient at discharge.  Should you have any questions/concerns, please feel free to reach out to me.  Thank you, Haynes Bast, BSN, [facility] [phone]

## 2023-07-05 ENCOUNTER — Emergency Department (HOSPITAL_COMMUNITY)
Admission: EM | Admit: 2023-07-05 | Discharge: 2023-07-06 | Disposition: A | Discharge status: Home / Self Care | Payer: PRIVATE HEALTH INSURANCE | Attending: Emergency Medicine | Admitting: Emergency Medicine

## 2023-07-05 ENCOUNTER — Emergency Department (HOSPITAL_COMMUNITY): Payer: Medicare Other

## 2023-07-05 ENCOUNTER — Emergency Department (HOSPITAL_COMMUNITY): Payer: PRIVATE HEALTH INSURANCE

## 2023-07-05 DIAGNOSIS — M545 Low back pain, unspecified: Secondary | ICD-10-CM | POA: Insufficient documentation

## 2023-07-05 DIAGNOSIS — W06XXXA Fall from bed, initial encounter: Secondary | ICD-10-CM | POA: Diagnosis not present

## 2023-07-05 DIAGNOSIS — W19XXXA Unspecified fall, initial encounter: Secondary | ICD-10-CM

## 2023-07-05 DIAGNOSIS — F0390 Unspecified dementia without behavioral disturbance: Secondary | ICD-10-CM | POA: Insufficient documentation

## 2023-07-05 NOTE — ED Notes (Signed)
Patient redirected back to bed after getting out bed and using the bedside table as a walker stating " he needs to get out of here as soon as possible because he does not have any money" Patient in bed with warm blankets and nurse provided and assisted patient with beverage

## 2023-07-05 NOTE — ED Triage Notes (Signed)
Patient BIB EMS from facility [facility]). Had an unwitnessed fall out of bed, as if rolled out of bed per EMS. Patient was rolled up in blankets. Complained of back pain until they got him out of floor then pain was gone per pateint. Patient denies any pain and is smiling and talkative during triage.  Dementia at baseline but able to answer some questions. Denies hitting his head.

## 2023-07-05 NOTE — Discharge Instructions (Signed)
Return for any problem.  ?

## 2023-07-05 NOTE — ED Notes (Signed)
Called [facility] for transport.  

## 2023-07-05 NOTE — ED Provider Notes (Signed)
[place] [facility] AT [facility] Provider Note   CSN: [id number] Arrival date & time: 07/05/23  1928     History  No chief complaint on file.   Stephen Wilkinson is a 89 y.o. male.  89 year old male with prior medical history as detailed below presents for evaluation.  Patient presents from his facility after fall out of bed.  Staff is suspicious that the patient rolled out of his bed and struck the floor.  Patient apparently complained of some mild low back pain initially.  Shortly thereafter he did not have any pain.  On arrival the patient is pleasantly confused and is without complaint.  He denies any pain.  The history is provided by the patient and medical records.       Home Medications Prior to Admission medications   Medication Sig Start Date End Date Taking? Authorizing Provider  amiodarone (PACERONE) 200 MG tablet Take 1 tablet (200 mg total) by mouth daily. 07/01/23   Royanne Foots, DO  Bevacizumab (AVASTIN IV) Place 1 Syringe into the right eye See admin instructions. 1 dose injected in the right eye every 7 weeks    [provider]  famotidine (PEPCID) 20 MG tablet Take 1 tablet (20 mg total) by mouth daily. 07/01/23   Estelle June A, DO  glucose blood (ONE TOUCH ULTRA TEST) test strip Use as directed once daily to check blood sugar.  Diagnosis code E11.42 07/01/23   Royanne Foots, DO  latanoprost (XALATAN) 0.005 % ophthalmic solution Place 1 drop into both eyes at bedtime. 07/01/23   Royanne Foots, DO  levothyroxine (SYNTHROID) 125 MCG tablet Take 1 tablet (125 mcg total) by mouth daily before breakfast. 07/01/23   Royanne Foots, DO  LORazepam (ATIVAN) 0.5 MG tablet Take 1 tablet (0.5 mg total) by mouth every 6 (six) hours as needed for anxiety. 07/01/23   Royanne Foots, DO  metFORMIN (GLUCOPHAGE) 1000 MG tablet Take 1 tablet (1,000 mg total) by mouth daily. 07/01/23   Royanne Foots, DO  QUEtiapine (SEROQUEL) 25 MG tablet Take 1  tablet (25 mg total) by mouth 2 (two) times daily. 05/07/23   Corwin Levins, MD      Allergies    Patient has no known allergies.    Review of Systems   Review of Systems  All other systems reviewed and are negative.   Physical Exam Updated Vital Signs BP (!) 146/66 (BP Location: Left Arm)   Pulse 64   Temp 97.7 F (36.5 C) (Oral)   Resp 16   SpO2 100%  Physical Exam Vitals and nursing note reviewed.  Constitutional:      General: He is not in acute distress.    Appearance: Normal appearance. He is well-developed.  HENT:     Head: Normocephalic and atraumatic.  Eyes:     Conjunctiva/sclera: Conjunctivae normal.     Pupils: Pupils are equal, round, and reactive to light.  Cardiovascular:     Rate and Rhythm: Normal rate and regular rhythm.     Heart sounds: Normal heart sounds.  Pulmonary:     Effort: Pulmonary effort is normal. No respiratory distress.     Breath sounds: Normal breath sounds.  Abdominal:     General: There is no distension.     Palpations: Abdomen is soft.     Tenderness: There is no abdominal tenderness.  Musculoskeletal:        General: No deformity. Normal range of motion.  Cervical back: Normal range of motion and neck supple.  Skin:    General: Skin is warm and dry.  Neurological:     General: No focal deficit present.     Mental Status: He is alert and oriented to person, place, and time.     ED Results / Procedures / Treatments   Labs (all labs ordered are listed, but only abnormal results are displayed) Labs Reviewed - No data to display  EKG None  Radiology No results found.  Procedures Procedures    Medications Ordered in ED Medications - No data to display  ED Course/ Medical Decision Making/ A&P                                 Medical Decision Making Amount and/or Complexity of Data Reviewed Radiology: ordered.    Medical Screen Complete  This patient presented to the ED with complaint of fall.  This  complaint involves an extensive number of treatment options. The initial differential diagnosis includes, but is not limited to, trauma from fall  This presentation is: Acute, Self-Limited, Previously Undiagnosed, Uncertain Prognosis, Complicated, Systemic Symptoms, and Threat to Life/Bodily Function  Demented patient presents after fall from bed to floor.  Patient without findings suggestive of significant traumatic injury on exam.  Patient is without complaint on initial evaluation.  Screening imaging obtained is without significant abnormality.  Patient remains comfortable.  He appears to be appropriate for return to facility.      Co morbidities that complicated the patient's evaluation  Dementia   Additional history obtained:  Additional history obtained from EMS External records from outside sources obtained and reviewed including prior ED visits and prior Inpatient records.    Imaging Studies ordered:  I ordered imaging studies including CT head, plain films of lumbar spine, pelvis, chest I independently visualized and interpreted obtained imaging which showed NAD I agree with the radiologist interpretation.   Problem List / ED Course:  Fall, dementia   Reevaluation:  After the interventions noted above, I reevaluated the patient and found that they have: improved  Disposition:  After consideration of the diagnostic results and the patients response to treatment, I feel that the patent would benefit from close outpatient follow-up.          Final Clinical Impression(s) / ED Diagnoses Final diagnoses:  Fall, initial encounter    Rx / DC Orders ED Discharge Orders     None         Wynetta Fines, MD 07/05/23 2231

## 2023-07-06 NOTE — ED Notes (Signed)
[facility] here to transport patient back to facility 

## 2023-07-07 ENCOUNTER — Other Ambulatory Visit: Payer: Self-pay | Admitting: Internal Medicine

## 2023-07-27 ENCOUNTER — Telehealth: Payer: Self-pay

## 2023-07-27 NOTE — Telephone Encounter (Signed)
Copied from CRM [id number]. Topic: General - Deceased Patient >> 08-19-2023  3:50 PM Chantha C wrote: Name of caller: Dorene Grebe called to let Dr. Jonny Ruiz know her husband passed away. Dorene Grebe did not provided anymore information.   Date of death: August 10, 2023

## 2023-08-14 DEATH — deceased

## 2023-09-27 ENCOUNTER — Ambulatory Visit: Payer: Medicare Other | Admitting: Podiatry

## 2023-11-03 IMAGING — MR MR HEAD W/O CM
12 of 13 series · 44 of 48 positions shown · non-contrast
Comparison: 11/02/2018 MRI, correlation is also made with
10/28/2021 CT head

CLINICAL DATA: Delirium

EXAM:
MRI HEAD WITHOUT CONTRAST
TECHNIQUE: Multiplanar, multiecho pulse sequences of the brain and surrounding
structures were obtained without intravenous contrast.

[Series 5: DWI · axial · 3.0mm · 0.96mm/px · z∈[-64,+106]mm · 7 of 116 slices shown (1 of 4)]
[im 1/116]
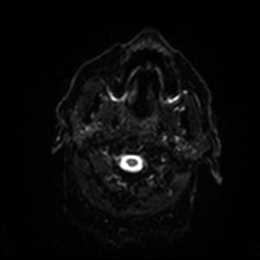
[im 20/116]
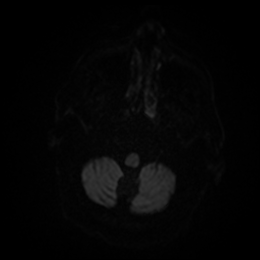
[im 39/116]
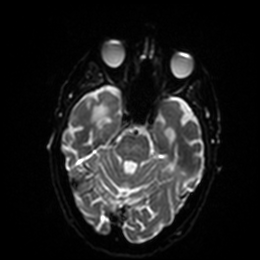
[im 58/116]
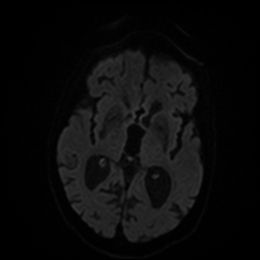
[im 77/116]
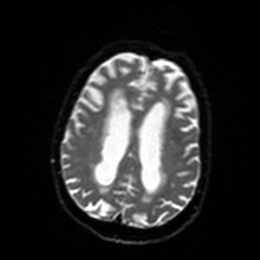
[im 96/116]
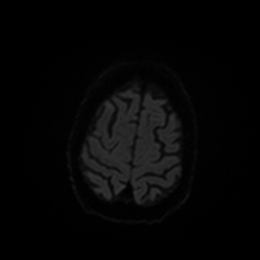
[im 116/116]
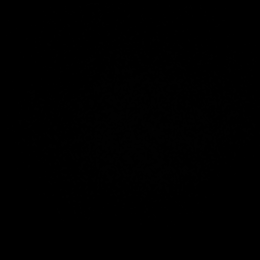

[Series 6: DWI · axial · 3.0mm · 0.96mm/px · z∈[-64,+103]mm · 4 of 57 slices shown (2 of 4)]
[im 1/57]
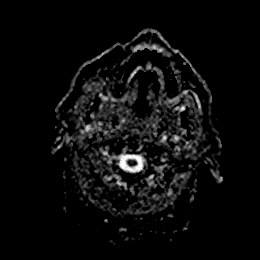
[im 19/57]
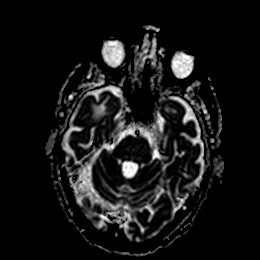
[im 38/57]
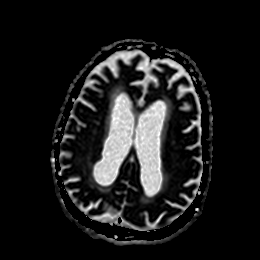
[im 57/57]
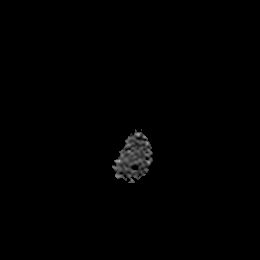

[Series 7: DWI · coronal · 4.0mm · 0.88mm/px · 6 of 92 slices shown (3 of 4)]
[im 1/92]
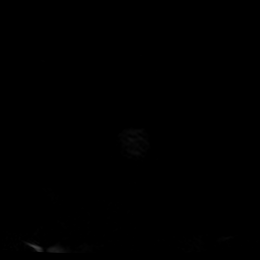
[im 19/92]
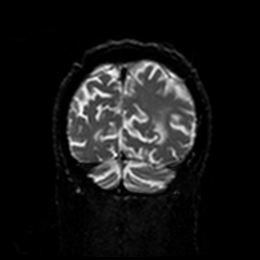
[im 37/92]
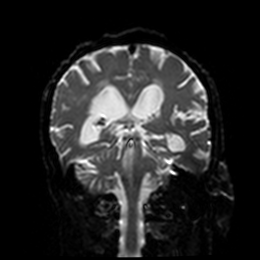
[im 55/92]
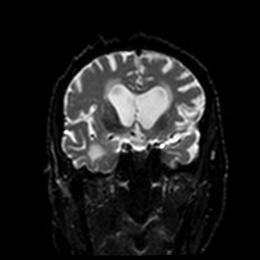
[im 73/92]
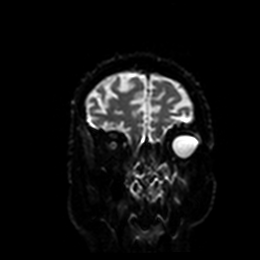
[im 92/92]
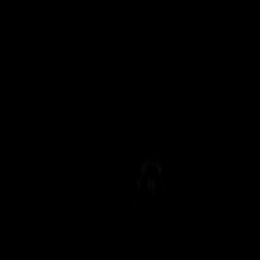

[Series 8: DWI · coronal · 4.0mm · 0.88mm/px · 3 of 46 slices shown (4 of 4)]
[im 1/46]
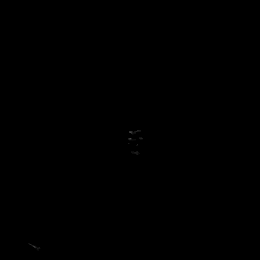
[im 23/46]
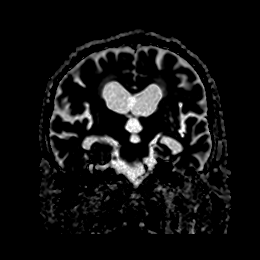
[im 46/46]
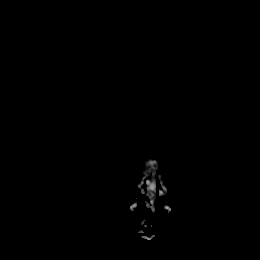

[Series 9: mag_images · axial · 3.0mm · 0.98mm/px · z∈[-66,+110]mm · 4 of 60 slices shown]
[im 1/60]
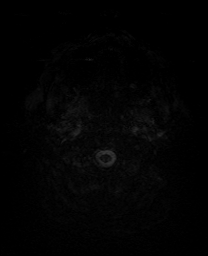
[im 20/60]
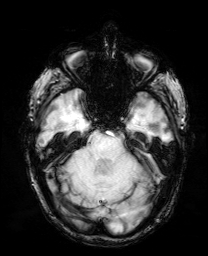
[im 40/60]
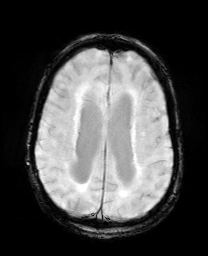
[im 60/60]
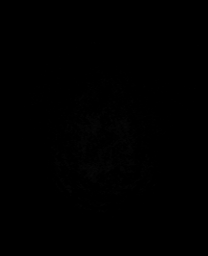

[Series 10: pha_images · axial · 3.0mm · 0.98mm/px · z∈[-66,+104]mm · 4 of 57 slices shown]
[im 1/57]
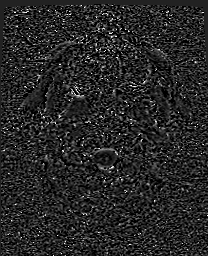
[im 19/57]
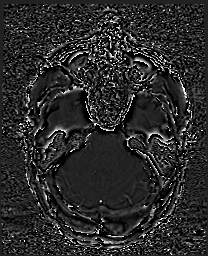
[im 38/57]
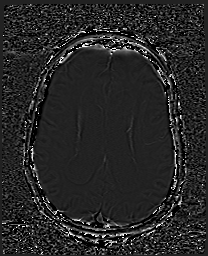
[im 57/57]
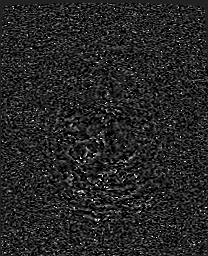

[Series 11: swi_images · axial · 3.0mm · 0.98mm/px · z∈[-66,+110]mm · 4 of 60 slices shown]
[im 1/60]
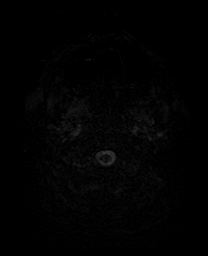
[im 20/60]
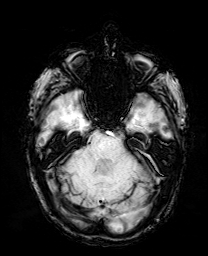
[im 40/60]
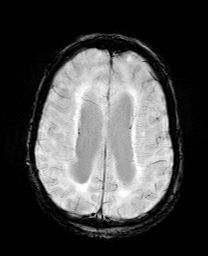
[im 60/60]
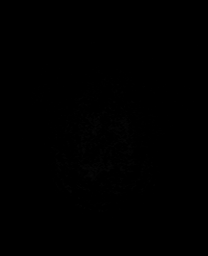

[Series 12: mip_images(sw) · axial · 24.0mm · 0.98mm/px · z∈[-56,+99]mm · 4 of 53 slices shown]
[im 1/53]
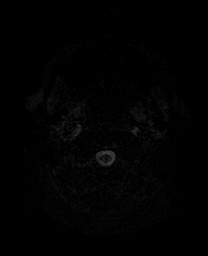
[im 18/53]
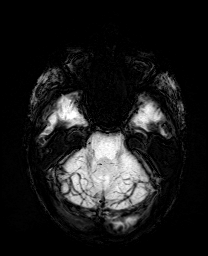
[im 35/53]
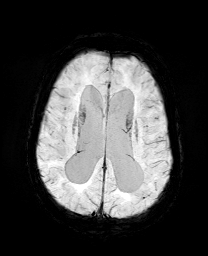
[im 53/53]
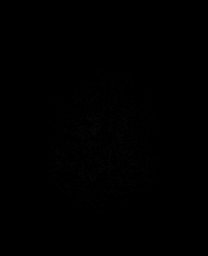

[Series 13: FLAIR · axial · 5.0mm · 0.98mm/px · z∈[-59,+103]mm · 2 of 28 slices shown]
[im 1/28]
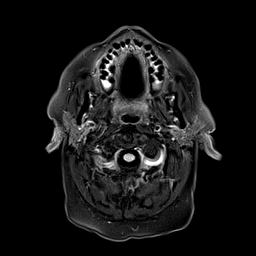
[im 28/28]
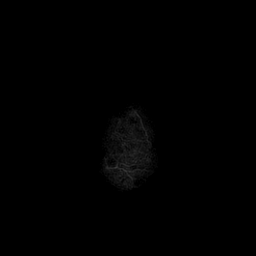

[Series 14: T1 · sagittal · 5.0mm · 0.94mm/px · 2 of 27 slices shown]
[im 1/27]
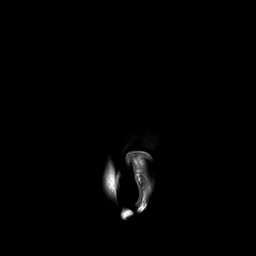
[im 27/27]
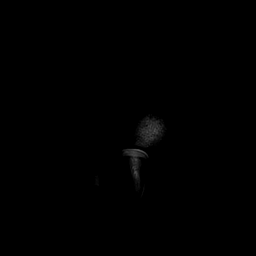

[Series 15: T2 · axial · 5.0mm · 0.78mm/px · z∈[-62,+106]mm · 2 of 29 slices shown (1 of 2)]
[im 1/29]
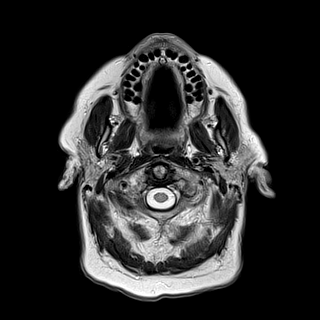
[im 29/29]
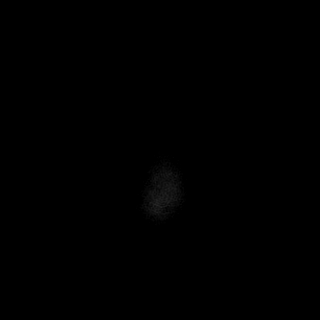

[Series 17: T2 · coronal · 5.0mm · 0.72mm/px · 2 of 37 slices shown (2 of 2)]
[im 1/37]
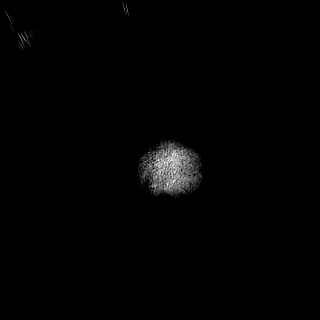
[im 37/37]
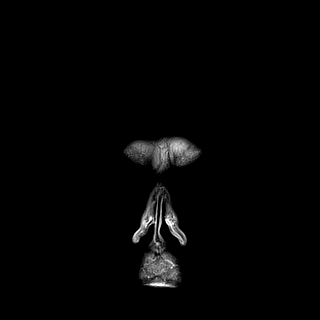

[44 of 48 positions shown; findings below may reference images not displayed]

FINDINGS: Brain: No restricted diffusion to suggest acute or subacute infarct.
No acute hemorrhage, mass, mass effect, or midline shift. No
hydrocephalus or extra-axial collection.

Redemonstrated generalized cerebral and cerebellar atrophy with
prominence of the ventricles, cisterns, and sulci, which appears
overall unchanged compared to 2828. Confluent T2 hyperintense signal
in the periventricular white matter and pons, likely the sequela of
moderate to severe chronic small vessel ischemic disease.
Redemonstrated lacunar infarct in the right posterior lentiform
nucleus. Small foci of susceptibility in both cerebral hemispheres
as well as the deep nuclei, with new focus in the left mesial
temporal lobe, likely sequela of chronic microhemorrhages.

Vascular: Normal arterial flow voids.

Skull and upper cervical spine: Normal marrow signal.

Sinuses/Orbits: Mucosal thickening in the left-greater-than-right
maxillary sinus. Status post bilateral lens replacements.

Other: The mastoids are well aerated.
IMPRESSION: No acute intracranial process. No evidence of acute or subacute
infarct.
